# Patient Record
Sex: Male | Born: 1950 | Race: White | Hispanic: No | Marital: Married | Smoking: Never smoker
Health system: Southern US, Community
[De-identification: ages and names within clinical notes are randomized; demographics above are authoritative.]

## PROBLEM LIST (undated history)

## (undated) DIAGNOSIS — I10 Essential (primary) hypertension: Secondary | ICD-10-CM

## (undated) DIAGNOSIS — M1990 Unspecified osteoarthritis, unspecified site: Secondary | ICD-10-CM

## (undated) DIAGNOSIS — R112 Nausea with vomiting, unspecified: Secondary | ICD-10-CM

## (undated) DIAGNOSIS — E7800 Pure hypercholesterolemia, unspecified: Secondary | ICD-10-CM

## (undated) DIAGNOSIS — C4490 Unspecified malignant neoplasm of skin, unspecified: Secondary | ICD-10-CM

## (undated) DIAGNOSIS — T8859XA Other complications of anesthesia, initial encounter: Secondary | ICD-10-CM

## (undated) DIAGNOSIS — R079 Chest pain, unspecified: Secondary | ICD-10-CM

## (undated) DIAGNOSIS — E119 Type 2 diabetes mellitus without complications: Secondary | ICD-10-CM

## (undated) DIAGNOSIS — K219 Gastro-esophageal reflux disease without esophagitis: Secondary | ICD-10-CM

## (undated) DIAGNOSIS — Z9289 Personal history of other medical treatment: Secondary | ICD-10-CM

## (undated) DIAGNOSIS — Z9889 Other specified postprocedural states: Secondary | ICD-10-CM

## (undated) DIAGNOSIS — Z86718 Personal history of other venous thrombosis and embolism: Secondary | ICD-10-CM

## (undated) DIAGNOSIS — C801 Malignant (primary) neoplasm, unspecified: Secondary | ICD-10-CM

## (undated) DIAGNOSIS — R569 Unspecified convulsions: Secondary | ICD-10-CM

## (undated) DIAGNOSIS — F419 Anxiety disorder, unspecified: Secondary | ICD-10-CM

## (undated) DIAGNOSIS — I219 Acute myocardial infarction, unspecified: Secondary | ICD-10-CM

## (undated) DIAGNOSIS — I493 Ventricular premature depolarization: Secondary | ICD-10-CM

## (undated) DIAGNOSIS — G4730 Sleep apnea, unspecified: Secondary | ICD-10-CM

## (undated) DIAGNOSIS — D332 Benign neoplasm of brain, unspecified: Secondary | ICD-10-CM

## (undated) DIAGNOSIS — S065X9A Traumatic subdural hemorrhage with loss of consciousness of unspecified duration, initial encounter: Secondary | ICD-10-CM

## (undated) HISTORY — DX: Type 2 diabetes mellitus without complications: E11.9

## (undated) HISTORY — DX: Acute myocardial infarction, unspecified: I21.9

## (undated) HISTORY — DX: Anxiety disorder, unspecified: F41.9

## (undated) HISTORY — DX: Benign neoplasm of brain, unspecified: D33.2

## (undated) HISTORY — PX: OTHER SURGICAL HISTORY: SHX169

## (undated) HISTORY — DX: Gastro-esophageal reflux disease without esophagitis: K21.9

## (undated) HISTORY — PX: VASECTOMY: SHX75

## (undated) HISTORY — DX: Pure hypercholesterolemia, unspecified: E78.00

## (undated) HISTORY — DX: Personal history of other venous thrombosis and embolism: Z86.718

## (undated) HISTORY — DX: Unspecified malignant neoplasm of skin, unspecified: C44.90

## (undated) HISTORY — DX: Chest pain, unspecified: R07.9

## (undated) HISTORY — DX: Personal history of other medical treatment: Z92.89

## (undated) HISTORY — DX: Ventricular premature depolarization: I49.3

## (undated) HISTORY — PX: KNEE SURGERY: SHX244

## (undated) HISTORY — DX: Unspecified convulsions: R56.9

## (undated) HISTORY — PX: BRAIN SURGERY: SHX531

---

## 2006-01-18 DIAGNOSIS — S065X9A Traumatic subdural hemorrhage with loss of consciousness of unspecified duration, initial encounter: Secondary | ICD-10-CM

## 2006-01-18 DIAGNOSIS — S065XAA Traumatic subdural hemorrhage with loss of consciousness status unknown, initial encounter: Secondary | ICD-10-CM

## 2006-01-18 HISTORY — DX: Traumatic subdural hemorrhage with loss of consciousness of unspecified duration, initial encounter: S06.5X9A

## 2006-01-18 HISTORY — DX: Traumatic subdural hemorrhage with loss of consciousness status unknown, initial encounter: S06.5XAA

## 2006-04-20 ENCOUNTER — Inpatient Hospital Stay (HOSPITAL_COMMUNITY): Admission: AD | Admit: 2006-04-20 | Discharge: 2006-04-23 | Payer: Self-pay | Admitting: Neurosurgery

## 2006-05-12 ENCOUNTER — Encounter: Admission: RE | Admit: 2006-05-12 | Discharge: 2006-05-12 | Payer: Self-pay | Admitting: Neurosurgery

## 2010-01-16 ENCOUNTER — Emergency Department (HOSPITAL_COMMUNITY)
Admission: EM | Admit: 2010-01-16 | Discharge: 2010-01-16 | Payer: Self-pay | Source: Home / Self Care | Admitting: Emergency Medicine

## 2010-02-08 ENCOUNTER — Encounter: Payer: Self-pay | Admitting: Neurosurgery

## 2010-03-30 LAB — DIFFERENTIAL
Eosinophils Absolute: 0 10*3/uL (ref 0.0–0.7)
Eosinophils Relative: 0 % (ref 0–5)
Lymphs Abs: 0.8 10*3/uL (ref 0.7–4.0)
Monocytes Absolute: 0.6 10*3/uL (ref 0.1–1.0)

## 2010-03-30 LAB — CBC
MCH: 31.1 pg (ref 26.0–34.0)
MCV: 89.4 fL (ref 78.0–100.0)
Platelets: 198 10*3/uL (ref 150–400)
RBC: 5.08 MIL/uL (ref 4.22–5.81)

## 2010-03-30 LAB — BASIC METABOLIC PANEL
BUN: 17 mg/dL (ref 6–23)
CO2: 24 mEq/L (ref 19–32)
Chloride: 106 mEq/L (ref 96–112)
Creatinine, Ser: 1.52 mg/dL — ABNORMAL HIGH (ref 0.4–1.5)

## 2010-03-30 LAB — POTASSIUM: Potassium: 4.9 mEq/L (ref 3.5–5.1)

## 2010-06-05 NOTE — H&P (Signed)
NAME:  Anthony Downs, Anthony Downs NO.:  [id number]   MEDICAL RECORD NO.:  [id number]          PATIENT TYPE:  INP   LOCATION:  3112                         FACILITY:  [facility]   PHYSICIAN:  Hilda Lias, M.D.   DATE OF BIRTH:  06-Nov-1950   DATE OF ADMISSION:  04/20/2006  DATE OF DISCHARGE:                              HISTORY & PHYSICAL   HISTORY OF PRESENT ILLNESS:  Anthony Downs is a gentleman who was  brought here from [facility] emergency room with a history of  while he was at work, he fell and hit his head.  He has a decreased  level of conscious.  He complains of headache, and he was taken to  [facility], where a CT scan of the head was obtained, and record  the findings of subdural hematoma.  He was transferred to our service.  At the present time, the patient complains of headache.  He denies any  weakness.  There is no evidence of nausea or vomiting.   PAST MEDICAL HISTORY:  Knee surgery.   ALLERGIES:  He is not allergic to any medications.   MEDICATIONS:  He is not taking any medications at this time.   FAMILY HISTORY:  Unremarkable.   PHYSICAL EXAMINATION:  VITAL SIGNS:  Pulse 74, blood pressure 130/70.  HEAD, EARS, NOSE, THROAT:  There is a parietal occipital scalp hematoma.  There is no evidence of any blood behind the ears.  There is no blood  from the ear or from the nose.  Neck:  He had no tenderness.  Nose was  clear.  HEART:  Heart sounds normal.  ABDOMEN:  Normal.  EXTREMITIES:  Scar from previous surgery.  NEUROLOGICAL EXAMINATION:  Mental status:  The patient is oriented x3.  He complains of a mild headache.  He is slightly, mildly lethargic.  Cranial nerve:  He had 2 mm, equal, reactive.  He has full extraocular  movements.  Face symmetrical.  There is no evidence of any blood behind  the tympanic membrane.  Face symmetrical.  Strength normal.  Sensation  normal.  Coordination normal.  Reflexes normal.   LABORATORY DATA:  The  cervical spine trace negative for fractures.  The  CT scan of the brain shows small subdural hematoma on the left side,  mostly from the temporal, with minimal shift.   IMPRESSION:  Closed head injury with a small subdural hematoma.   RECOMMENDATIONS:  The patient is going to be admitted to the Intensive  Care Unit.  We are going to closely  monitor him.  At the present time,  there is no need for any surgery.  We are going to repeat the CT scan in  the morning or p.r.n. as needed.          ______________________________  Hilda Lias, M.D.    EB/MEDQ  D:  04/20/2006  T:  04/21/2006  Job:  [id number]

## 2010-08-25 ENCOUNTER — Emergency Department (HOSPITAL_COMMUNITY)
Admission: EM | Admit: 2010-08-25 | Discharge: 2010-08-25 | Disposition: A | Discharge status: Home / Self Care | Payer: BC Managed Care – PPO | Attending: Emergency Medicine | Admitting: Emergency Medicine

## 2010-08-25 ENCOUNTER — Encounter: Payer: Self-pay | Admitting: Emergency Medicine

## 2010-08-25 DIAGNOSIS — W268XXA Contact with other sharp object(s), not elsewhere classified, initial encounter: Secondary | ICD-10-CM | POA: Insufficient documentation

## 2010-08-25 DIAGNOSIS — J45909 Unspecified asthma, uncomplicated: Secondary | ICD-10-CM | POA: Insufficient documentation

## 2010-08-25 DIAGNOSIS — S61419A Laceration without foreign body of unspecified hand, initial encounter: Secondary | ICD-10-CM

## 2010-08-25 DIAGNOSIS — S61409A Unspecified open wound of unspecified hand, initial encounter: Secondary | ICD-10-CM | POA: Insufficient documentation

## 2010-08-25 HISTORY — DX: Traumatic subdural hemorrhage with loss of consciousness of unspecified duration, initial encounter: S06.5X9A

## 2010-08-25 MED ORDER — HYDROCODONE-ACETAMINOPHEN 5-325 MG PO TABS: ORAL_TABLET | ORAL | Status: DC

## 2010-08-25 NOTE — ED Notes (Signed)
Pt reports he "hung my hand on a piece of galvanized tin." (Power bin on top of house)

## 2010-08-25 NOTE — ED Provider Notes (Signed)
History     CSN: [id number] Arrival date & time: 08/25/2010  4:12 PM  Chief Complaint  Patient presents with  . Laceration    R hand   Patient is a 60 y.o. male presenting with skin laceration. The history is provided by the patient.  Laceration  The incident occurred 3 to 5 hours ago. The laceration is located on the right hand. The laceration is 3 cm in size. Injury mechanism: sharpe tin. The pain is moderate. The pain has been constant since onset. He reports no foreign bodies present. His tetanus status is UTD.    Past Medical History  Diagnosis Date  . Subdural hematoma, post-traumatic   . Asthma     Past Surgical History  Procedure Date  . Arthroscopic knee   . Vasectomy     History reviewed. No pertinent family history.  History  Substance Use Topics  . Smoking status: Never Smoker   . Smokeless tobacco: Never Used  . Alcohol Use: No      Review of Systems  Constitutional: Negative for activity change.       All ROS Neg except as noted in HPI  HENT: Negative for nosebleeds and neck pain.   Eyes: Negative for photophobia and discharge.  Respiratory: Negative for cough, shortness of breath and wheezing.   Cardiovascular: Negative for chest pain and palpitations.  Gastrointestinal: Negative for abdominal pain and blood in stool.  Genitourinary: Negative for dysuria, frequency and hematuria.  Musculoskeletal: Negative for back pain and arthralgias.  Skin: Negative.   Neurological: Negative for dizziness, seizures and speech difficulty.  Psychiatric/Behavioral: Negative for hallucinations and confusion.    Physical Exam  BP 154/88  Pulse 76  Temp(Src) 98.4 F (36.9 C) (Oral)  Resp 18  Ht 6\' 1"  (1.854 m)  Wt 281 lb (127.461 kg)  BMI 37.07 kg/m2  SpO2 98%  Physical Exam  Nursing note and vitals reviewed. Constitutional: He is oriented to person, place, and time. He appears well-developed and well-nourished.  Non-toxic appearance.  HENT:  Head:  Normocephalic.  Right Ear: Tympanic membrane and external ear normal.  Left Ear: Tympanic membrane and external ear normal.  Eyes: EOM and lids are normal. Pupils are equal, round, and reactive to light.  Neck: Normal range of motion. Neck supple. Carotid bruit is not present.  Cardiovascular: Normal rate, regular rhythm, normal heart sounds, intact distal pulses and normal pulses.   Pulmonary/Chest: Breath sounds normal. No respiratory distress.  Abdominal: Soft. Bowel sounds are normal. There is no tenderness. There is no guarding.  Musculoskeletal: Normal range of motion.       Laceration to the palmar surface of the right hand. No bone or tendon involvement.  Lymphadenopathy:       Head (right side): No submandibular adenopathy present.       Head (left side): No submandibular adenopathy present.    He has no cervical adenopathy.  Neurological: He is alert and oriented to person, place, and time. He has normal strength. No cranial nerve deficit or sensory deficit.  Skin: Skin is warm and dry.  Psychiatric: He has a normal mood and affect. His speech is normal.    ED Course  LACERATION REPAIR Date/Time: 08/25/2010 5:15 PM Performed by: Kathie Dike Authorized by: Glynn Octave Consent: Verbal consent obtained. Consent given by: patient Patient understanding: patient states understanding of the procedure being performed Patient identity confirmed: arm band Time out: Immediately prior to procedure a "time out" was called to verify the  correct patient, procedure, equipment, support staff and site/side marked as required. Body area: upper extremity Location details: right hand Laceration length: 3.2 cm Foreign bodies: no foreign bodies Tendon involvement: none Nerve involvement: none Vascular damage: no Patient sedated: no Irrigation solution: tap water Irrigation method: tap Amount of cleaning: standard Debridement: none Degree of undermining: none Skin closure:  glue Approximation difficulty: simple Dressing: 4x4 sterile gauze and gauze roll Patient tolerance: Patient tolerated the procedure well with no immediate complications.    MDM I have reviewed nursing notes, vital signs, and all appropriate lab and imaging results for this patient.      Kathie Dike, [place] 08/25/10 1718

## 2010-08-25 NOTE — ED Notes (Signed)
approx 1.5 cm laceration to base of right thumb-states cut on galvenized pipe while cutting shingles around it; bleeding is controlled with bandaid in place, with edges well-approximated

## 2010-08-25 NOTE — ED Notes (Signed)
Wound to right hand dressed with telfa and kling

## 2010-08-26 NOTE — ED Provider Notes (Signed)
Medical screening examination/treatment/procedure(s) were performed by non-physician practitioner and as supervising physician I was immediately available for consultation/collaboration.  Glynn Octave, MD 08/26/10 [phone]

## 2011-04-04 ENCOUNTER — Emergency Department (HOSPITAL_COMMUNITY)
Admission: EM | Admit: 2011-04-04 | Discharge: 2011-04-05 | Disposition: A | Discharge status: Home / Self Care | Payer: BC Managed Care – PPO | Attending: Emergency Medicine | Admitting: Emergency Medicine

## 2011-04-04 ENCOUNTER — Encounter (HOSPITAL_COMMUNITY): Payer: Self-pay | Admitting: *Deleted

## 2011-04-04 DIAGNOSIS — A084 Viral intestinal infection, unspecified: Secondary | ICD-10-CM

## 2011-04-04 DIAGNOSIS — A088 Other specified intestinal infections: Secondary | ICD-10-CM | POA: Insufficient documentation

## 2011-04-04 DIAGNOSIS — R112 Nausea with vomiting, unspecified: Secondary | ICD-10-CM | POA: Insufficient documentation

## 2011-04-04 DIAGNOSIS — R197 Diarrhea, unspecified: Secondary | ICD-10-CM | POA: Insufficient documentation

## 2011-04-04 DIAGNOSIS — R6883 Chills (without fever): Secondary | ICD-10-CM | POA: Insufficient documentation

## 2011-04-04 DIAGNOSIS — R1084 Generalized abdominal pain: Secondary | ICD-10-CM | POA: Insufficient documentation

## 2011-04-04 DIAGNOSIS — J45909 Unspecified asthma, uncomplicated: Secondary | ICD-10-CM | POA: Insufficient documentation

## 2011-04-04 DIAGNOSIS — R5381 Other malaise: Secondary | ICD-10-CM | POA: Insufficient documentation

## 2011-04-04 LAB — CBC
HCT: 44.1 % (ref 39.0–52.0)
Hemoglobin: 14.9 g/dL (ref 13.0–17.0)
MCH: 31 pg (ref 26.0–34.0)
MCHC: 33.8 g/dL (ref 30.0–36.0)
MCV: 91.9 fL (ref 78.0–100.0)
RBC: 4.8 MIL/uL (ref 4.22–5.81)

## 2011-04-04 LAB — URINALYSIS, ROUTINE W REFLEX MICROSCOPIC
Bilirubin Urine: NEGATIVE
Glucose, UA: NEGATIVE mg/dL
Hgb urine dipstick: NEGATIVE
Ketones, ur: 15 mg/dL — AB
Nitrite: NEGATIVE
pH: 6 (ref 5.0–8.0)

## 2011-04-04 LAB — BASIC METABOLIC PANEL
BUN: 20 mg/dL (ref 6–23)
CO2: 25 mEq/L (ref 19–32)
Chloride: 105 mEq/L (ref 96–112)
Glucose, Bld: 160 mg/dL — ABNORMAL HIGH (ref 70–99)
Potassium: 5.2 mEq/L — ABNORMAL HIGH (ref 3.5–5.1)

## 2011-04-04 LAB — DIFFERENTIAL
Basophils Relative: 0 % (ref 0–1)
Eosinophils Absolute: 0 10*3/uL (ref 0.0–0.7)
Lymphs Abs: 0.6 10*3/uL — ABNORMAL LOW (ref 0.7–4.0)
Monocytes Absolute: 0.4 10*3/uL (ref 0.1–1.0)
Monocytes Relative: 5 % (ref 3–12)

## 2011-04-04 MED ORDER — MORPHINE SULFATE 4 MG/ML IJ SOLN
4.0000 mg | Freq: Once | INTRAMUSCULAR | Status: AC
  Administered 2011-04-04: 4 mg via INTRAVENOUS
  Filled 2011-04-04: qty 1

## 2011-04-04 MED ORDER — DIPHENOXYLATE-ATROPINE 2.5-0.025 MG PO TABS
1.0000 | ORAL_TABLET | Freq: Once | ORAL | Status: AC
  Administered 2011-04-04: 1 via ORAL
  Filled 2011-04-04: qty 1

## 2011-04-04 MED ORDER — ONDANSETRON HCL 4 MG PO TABS: 4.0000 mg | ORAL_TABLET | Freq: Four times a day (QID) | ORAL | Status: AC

## 2011-04-04 MED ORDER — SODIUM CHLORIDE 0.9 % IV BOLUS (SEPSIS)
1000.0000 mL | Freq: Once | INTRAVENOUS | Status: AC
  Administered 2011-04-04: 1000 mL via INTRAVENOUS

## 2011-04-04 MED ORDER — ONDANSETRON HCL 4 MG/2ML IJ SOLN
8.0000 mg | Freq: Once | INTRAMUSCULAR | Status: AC
  Administered 2011-04-04: 8 mg via INTRAVENOUS
  Filled 2011-04-04: qty 4

## 2011-04-04 MED ORDER — DIPHENOXYLATE-ATROPINE 2.5-0.025 MG PO TABS: 1.0000 | ORAL_TABLET | Freq: Four times a day (QID) | ORAL | Status: AC | PRN

## 2011-04-04 MED ORDER — PANTOPRAZOLE SODIUM 40 MG IV SOLR
40.0000 mg | Freq: Once | INTRAVENOUS | Status: AC
  Administered 2011-04-04: 40 mg via INTRAVENOUS
  Filled 2011-04-04: qty 40

## 2011-04-04 NOTE — ED Provider Notes (Signed)
History     CSN: [id number]  Arrival date & time 04/04/11  1939   First MD Initiated Contact with Patient 04/04/11 2028      Chief Complaint  Patient presents with  . Nausea  . Emesis  . Diarrhea  . Abdominal Pain  . Chills    (Consider location/radiation/quality/duration/timing/severity/associated sxs/prior treatment) HPI Comments: Associated abdominal pain which began after he had numerous episodes of emesis. The emesis and diarrhea is nonbloody. No recent abx use  Patient is a 61 y.o. male presenting with vomiting and diarrhea. The history is provided by the patient. No language interpreter was used.  Emesis  This is a new problem. The current episode started 12 to 24 hours ago. The problem occurs 5 to 10 times per day. The problem has been gradually worsening. The emesis has an appearance of stomach contents. There has been no fever. Associated symptoms include abdominal pain and diarrhea. Pertinent negatives include no arthralgias, no chills, no cough, no fever, no headaches and no myalgias.  Diarrhea The primary symptoms include fatigue, abdominal pain, nausea, vomiting and diarrhea. Primary symptoms do not include fever, dysuria, myalgias or arthralgias. The illness began today. The onset was gradual. The problem has been gradually worsening.  The fatigue began today. The fatigue has been worsening since its onset. The fatigue is worsened by nothing.  The abdominal pain began today. The abdominal pain has been gradually worsening since its onset. The abdominal pain is generalized. The abdominal pain does not radiate. The abdominal pain is relieved by nothing.  The diarrhea began today. The diarrhea is watery. The diarrhea occurs 5 to 10 times per day.  The illness does not include chills, constipation or back pain.    Past Medical History  Diagnosis Date  . Subdural hematoma, post-traumatic   . Asthma     Past Surgical History  Procedure Date  . Arthroscopic knee   .  Vasectomy     History reviewed. No pertinent family history.  History  Substance Use Topics  . Smoking status: Never Smoker   . Smokeless tobacco: Never Used  . Alcohol Use: No      Review of Systems  Constitutional: Positive for activity change, appetite change and fatigue. Negative for fever and chills.  HENT: Negative for congestion, sore throat, rhinorrhea, neck pain and neck stiffness.   Respiratory: Negative for cough and shortness of breath.   Cardiovascular: Negative for chest pain and palpitations.  Gastrointestinal: Positive for nausea, vomiting, abdominal pain and diarrhea. Negative for constipation and blood in stool.  Genitourinary: Negative for dysuria, urgency, frequency and flank pain.  Musculoskeletal: Negative for myalgias, back pain and arthralgias.  Neurological: Positive for weakness (generalized). Negative for dizziness, light-headedness, numbness and headaches.  All other systems reviewed and are negative.    Allergies  Review of patient's allergies indicates no known allergies.  Home Medications   Current Outpatient Rx  Name Route Sig Dispense Refill  . IBUPROFEN 200 MG PO TABS Oral Take 800 mg by mouth as needed. For back pain     . OMEPRAZOLE 20 MG PO CPDR Oral Take 40 mg by mouth daily.      Marland Kitchen DIPHENOXYLATE-ATROPINE 2.5-0.025 MG PO TABS Oral Take 1 tablet by mouth 4 (four) times daily as needed for diarrhea or loose stools. 16 tablet 0  . ONDANSETRON HCL 4 MG PO TABS Oral Take 1 tablet (4 mg total) by mouth every 6 (six) hours. 12 tablet 0    BP 119/66  Pulse 116  Temp 98.6 F (37 C)  Resp 20  Ht 6\' 1"  (1.854 m)  Wt 280 lb (127.007 kg)  BMI 36.94 kg/m2  SpO2 98%  Physical Exam  Nursing note and vitals reviewed. Constitutional: He is oriented to person, place, and time. He appears well-developed and well-nourished. No distress.  HENT:  Head: Normocephalic and atraumatic.  Mouth/Throat: Oropharynx is clear and moist.  Eyes: Conjunctivae  and EOM are normal. Pupils are equal, round, and reactive to light.  Neck: Normal range of motion. Neck supple.  Cardiovascular: Regular rhythm, normal heart sounds and intact distal pulses.  Exam reveals no gallop and no friction rub.   No murmur heard.      Tachycardic rate  Pulmonary/Chest: Effort normal and breath sounds normal. No respiratory distress.  Abdominal: Soft. Bowel sounds are normal. There is tenderness (diffuse). There is no rebound and no guarding.  Musculoskeletal: Normal range of motion. He exhibits no tenderness.  Neurological: He is alert and oriented to person, place, and time. No cranial nerve deficit.  Skin: Skin is warm and dry. No rash noted.    ED Course  Procedures (including critical care time)   Labs Reviewed  CBC  DIFFERENTIAL  BASIC METABOLIC PANEL  URINALYSIS, ROUTINE W REFLEX MICROSCOPIC  LACTIC ACID, PLASMA  LACTIC ACID, PLASMA  BASIC METABOLIC PANEL  CBC  DIFFERENTIAL   No results found.   1. Viral gastroenteritis       MDM  Mild tachycardia on arrival which I anticipate will resolve with 2 L of IV fluids. Symptoms have improved after receiving Zofran, pain medication. I also administered Lomotil for symptom control. He'll be given a by mouth challenge in the emergency department. If he succeeds he'll be discharged home. Care to be assumed by my colleague Dr. Radford Pax.his symptoms are easily explained that are gastroenteritis. I have no concern about a malignant cause of abdominal pain such as appendicitis, ischemic bowel. There is no indication for imaging at this time.        Dayton Bailiff, MD 04/04/11 2140

## 2011-04-04 NOTE — Discharge Instructions (Signed)
Viral Gastroenteritis Viral gastroenteritis is also known as stomach flu. This condition affects the stomach and intestinal tract. It can cause sudden diarrhea and vomiting. The illness typically lasts 3 to 8 days. Most people develop an immune response that eventually gets rid of the virus. While this natural response develops, the virus can make you quite ill. CAUSES  Many different viruses can cause gastroenteritis, such as rotavirus or noroviruses. You can catch one of these viruses by consuming contaminated food or water. You may also catch a virus by sharing utensils or other personal items with an infected person or by touching a contaminated surface. SYMPTOMS  The most common symptoms are diarrhea and vomiting. These problems can cause a severe loss of body fluids (dehydration) and a body salt (electrolyte) imbalance. Other symptoms may include:  Fever.   Headache.   Fatigue.   Abdominal pain.  DIAGNOSIS  Your caregiver can usually diagnose viral gastroenteritis based on your symptoms and a physical exam. A stool sample may also be taken to test for the presence of viruses or other infections. TREATMENT  This illness typically goes away on its own. Treatments are aimed at rehydration. The most serious cases of viral gastroenteritis involve vomiting so severely that you are not able to keep fluids down. In these cases, fluids must be given through an intravenous line (IV). HOME CARE INSTRUCTIONS   Drink enough fluids to keep your urine clear or pale yellow. Drink small amounts of fluids frequently and increase the amounts as tolerated.   Ask your caregiver for specific rehydration instructions.   Avoid:   Foods high in sugar.   Alcohol.   Carbonated drinks.   Tobacco.   Juice.   Caffeine drinks.   Extremely hot or cold fluids.   Fatty, greasy foods.   Too much intake of anything at one time.   Dairy products until 24 to 48 hours after diarrhea stops.   You may  consume probiotics. Probiotics are active cultures of beneficial bacteria. They may lessen the amount and number of diarrheal stools in adults. Probiotics can be found in yogurt with active cultures and in supplements.   Wash your hands well to avoid spreading the virus.   Only take over-the-counter or prescription medicines for pain, discomfort, or fever as directed by your caregiver. Do not give aspirin to children. Antidiarrheal medicines are not recommended.   Ask your caregiver if you should continue to take your regular prescribed and over-the-counter medicines.   Keep all follow-up appointments as directed by your caregiver.  SEEK IMMEDIATE MEDICAL CARE IF:   You are unable to keep fluids down.   You do not urinate at least once every 6 to 8 hours.   You develop shortness of breath.   You notice blood in your stool or vomit. This may look like coffee grounds.   You have abdominal pain that increases or is concentrated in one small area (localized).   You have persistent vomiting or diarrhea.   You have a fever.   The patient is a child younger than 3 months, and he or she has a fever.   The patient is a child older than 3 months, and he or she has a fever and persistent symptoms.   The patient is a child older than 3 months, and he or she has a fever and symptoms suddenly get worse.   The patient is a baby, and he or she has no tears when crying.  MAKE SURE YOU:     Understand these instructions.   Will watch your condition.   Will get help right away if you are not doing well or get worse.  Document Released: 01/04/2005 Document Revised: 12/24/2010 Document Reviewed: 10/21/2010 [employer] Patient Information 2012 [employer]. 

## 2011-04-04 NOTE — ED Notes (Addendum)
Pt c/o n/v/d since 3am last night. Unable to keep anti-nausea meds down. Pt also having chills and sweats.

## 2011-04-05 NOTE — ED Notes (Signed)
Feeling better - states he is ready for discharge

## 2014-10-15 ENCOUNTER — Ambulatory Visit (INDEPENDENT_AMBULATORY_CARE_PROVIDER_SITE_OTHER): Payer: Medicare Other | Admitting: Orthopedic Surgery

## 2014-10-15 ENCOUNTER — Ambulatory Visit (INDEPENDENT_AMBULATORY_CARE_PROVIDER_SITE_OTHER): Payer: Medicare Other

## 2014-10-15 ENCOUNTER — Encounter: Payer: Self-pay | Admitting: Orthopedic Surgery

## 2014-10-15 VITALS — BP 145/84 | Ht 73.0 in | Wt 296.0 lb

## 2014-10-15 DIAGNOSIS — M25561 Pain in right knee: Secondary | ICD-10-CM

## 2014-10-15 DIAGNOSIS — M1711 Unilateral primary osteoarthritis, right knee: Secondary | ICD-10-CM

## 2014-10-15 MED ORDER — DICLOFENAC POTASSIUM 50 MG PO TABS: 50.0000 mg | ORAL_TABLET | Freq: Two times a day (BID) | ORAL | Status: DC

## 2014-10-15 NOTE — Progress Notes (Signed)
Patient ID: Anthony Downs, male   DOB: 1950-05-09, 64 y.o.   MRN: [id number]  New patient   Chief Complaint  Patient presents with  . Knee Pain    Right knee pain     Anthony Downs is a 64 y.o. male.   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 It's a dull aching pain is no trauma. He had a knee arthroscopy in 2004 his pain is 6 out of 10 is worse with standing and better with rest no other treatment. Completing comprehensive review of systems we note seasonal allergy back pain joint pain ankle leg swelling in otherwise normal   Review of Systems See hpi  Past Medical History  Diagnosis Date  . Subdural hematoma, post-traumatic   . Asthma     Past Surgical History  Procedure Laterality Date  . Arthroscopic knee    . Vasectomy      No family history on file.  Social History Social History  Substance Use Topics  . Smoking status: Never Smoker   . Smokeless tobacco: Never Used  . Alcohol Use: No    No Known Allergies  Current Outpatient Prescriptions  Medication Sig Dispense Refill  . diclofenac (CATAFLAM) 50 MG tablet Take 1 tablet (50 mg total) by mouth 2 (two) times daily. 60 tablet 3  . ibuprofen (ADVIL,MOTRIN) 200 MG tablet Take 800 mg by mouth as needed. For back pain     . omeprazole (PRILOSEC) 20 MG capsule Take 40 mg by mouth daily.       No current facility-administered medications for this visit.       Physical Exam Blood pressure 145/84, height 6\' 1"  (1.854 m), weight 296 lb (134.265 kg). Physical Exam The patient is well developed well nourished and well groomed. Orientation to person place and time is normal  Mood is pleasant. Ambulatory status is normal without a limp Skin remains intact without laceration ulceration or erythema Gross motor exam is intact  without atrophy. Muscle tone normal grade 5 motor strength Neurovascular exam remains intact Inspection of the left knee reveals full range of motion no strength deficits no instability  Right knee range of motion is full medial joint line is nontender patellofemoral crepitance positive quadriceps in addition test joint stability confirmed with Lachman test posterior drawer test and collateral ligament test.  Meniscal signs are negative   Data Reviewed  I interpreted the images as  Varus mild with moderate joint space narrowing equaling moderate osteoarthritis right knee  Assessment   Primary osteoarthritis right knee with patellofemoral primary symptom at this point  Procedure note right knee injection verbal consent was obtained to inject right knee joint  Timeout was completed to confirm the site of injection  The medications used were 40 mg of Depo-Medrol and 1% lidocaine 3 cc  Anesthesia was provided by ethyl chloride and the skin was prepped with alcohol.  After cleaning the skin with alcohol a 20-gauge needle was used to inject the right knee joint. There were no complications. A sterile bandage was applied.   Plan   Meds ordered this encounter  Medications  . diclofenac (CATAFLAM) 50 MG tablet    Sig: Take 1 tablet (50 mg total) by mouth 2 (two) times daily.    Dispense:  60 tablet    Refill:  3  2 months f/u

## 2014-12-17 ENCOUNTER — Ambulatory Visit (INDEPENDENT_AMBULATORY_CARE_PROVIDER_SITE_OTHER): Payer: Medicare Other | Admitting: Orthopedic Surgery

## 2014-12-17 VITALS — BP 138/84 | Ht 73.0 in | Wt 303.0 lb

## 2014-12-17 DIAGNOSIS — M1711 Unilateral primary osteoarthritis, right knee: Secondary | ICD-10-CM

## 2014-12-17 NOTE — Progress Notes (Signed)
Subjective:     Patient ID: Anthony Downs, male   DOB: Mar 06, 1950, 64 y.o.   MRN: [id number]  HPI Chief Complaint  Patient presents with  . 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 It's a dull aching pain is no trauma. He had a knee arthroscopy in 2004 his pain is 6 out of 10 is worse with standing and better with rest no other treatment. Completing comprehensive review of systems we note seasonal allergy back pain joint pain ankle leg swelling in otherwise normal   Review of Systems     Objective:   Physical Exam Normal range of motion no effusion    Assessment:     Improved status post injection diclofenac    Plan:     Follow-up as needed

## 2015-02-09 ENCOUNTER — Other Ambulatory Visit: Payer: Self-pay | Admitting: Orthopedic Surgery

## 2015-02-21 ENCOUNTER — Other Ambulatory Visit: Payer: Self-pay | Admitting: *Deleted

## 2015-02-21 MED ORDER — DICLOFENAC POTASSIUM 50 MG PO TABS: 50.0000 mg | ORAL_TABLET | Freq: Two times a day (BID) | ORAL | Status: DC

## 2016-08-28 ENCOUNTER — Emergency Department (HOSPITAL_COMMUNITY): Payer: Medicare Other

## 2016-08-28 ENCOUNTER — Encounter (HOSPITAL_COMMUNITY): Payer: Self-pay | Admitting: Emergency Medicine

## 2016-08-28 ENCOUNTER — Emergency Department (HOSPITAL_COMMUNITY)
Admission: EM | Admit: 2016-08-28 | Discharge: 2016-08-28 | Disposition: A | Discharge status: Home / Self Care | Payer: Medicare Other | Attending: Emergency Medicine | Admitting: Emergency Medicine

## 2016-08-28 DIAGNOSIS — S40022A Contusion of left upper arm, initial encounter: Secondary | ICD-10-CM | POA: Diagnosis not present

## 2016-08-28 DIAGNOSIS — Y999 Unspecified external cause status: Secondary | ICD-10-CM | POA: Diagnosis not present

## 2016-08-28 DIAGNOSIS — S0990XA Unspecified injury of head, initial encounter: Secondary | ICD-10-CM | POA: Insufficient documentation

## 2016-08-28 DIAGNOSIS — W108XXA Fall (on) (from) other stairs and steps, initial encounter: Secondary | ICD-10-CM | POA: Insufficient documentation

## 2016-08-28 DIAGNOSIS — Y929 Unspecified place or not applicable: Secondary | ICD-10-CM | POA: Insufficient documentation

## 2016-08-28 DIAGNOSIS — J45909 Unspecified asthma, uncomplicated: Secondary | ICD-10-CM | POA: Diagnosis not present

## 2016-08-28 DIAGNOSIS — Y939 Activity, unspecified: Secondary | ICD-10-CM | POA: Insufficient documentation

## 2016-08-28 MED ORDER — BACITRACIN ZINC 500 UNIT/GM EX OINT
TOPICAL_OINTMENT | CUTANEOUS | Status: AC
  Filled 2016-08-28: qty 0.9

## 2016-08-28 NOTE — ED Notes (Signed)
Pt with mild nausea but denies emesis,  Pt with hx of subdural hematoma about 10 yrs ago.

## 2016-08-28 NOTE — Discharge Instructions (Signed)
If you were given medicines take as directed.  If you are on coumadin or contraceptives realize their levels and effectiveness is altered by many different medicines.  If you have any reaction (rash, tongues swelling, other) to the medicines stop taking and see a physician.    If your blood pressure was elevated in the ER make sure you follow up for management with a primary doctor or return for chest pain, shortness of breath or stroke symptoms.  Please follow up as directed and return to the ER or see a physician for new or worsening symptoms.  Thank you. Vitals:   08/28/16 1101 08/28/16 1104  BP:  (!) 152/80  Pulse:  75  Resp:  16  Temp:  98.2 F (36.8 C)  TempSrc:  Oral  SpO2:  95%  Weight: 134.3 kg (296 lb)   Height: 6' (1.829 m)

## 2016-08-28 NOTE — ED Provider Notes (Signed)
[place] DEPT Provider Note   CSN: [id number] Arrival date & time: 08/28/16  1056     History   Chief Complaint Chief Complaint  Patient presents with  . Fall    HPI Anthony Downs is a 66 y.o. male.  Patient presents after head injury. Patient fell down 4 steps backing the left side of his scalp on the cement. No loss of consciousness mild nausea without vomiting. No blood thinners. Patient did have to medics subdural hematoma from a fall the past. Patient had left arm injury with superficial abrasions.      Past Medical History:  Diagnosis Date  . Asthma   . Subdural hematoma, post-traumatic (HCC)     There are no active problems to display for this patient.   Past Surgical History:  Procedure Laterality Date  . arthroscopic knee    . VASECTOMY         Home Medications    Prior to Admission medications   Medication Sig Start Date End Date Taking? Authorizing Provider  atorvastatin (LIPITOR) 10 MG tablet Take 1 tablet by mouth at bedtime. 08/27/16  Yes [provider]  ibuprofen (ADVIL,MOTRIN) 200 MG tablet Take 800 mg by mouth as needed for moderate pain.    Yes [provider]  omeprazole (PRILOSEC) 20 MG capsule Take 40 mg by mouth daily.     Yes [provider]  Propylene Glycol-Glycerin (SOOTHE OP) Apply 1 drop to eye daily as needed (dry eyes).   Yes [provider]    Family History No family history on file.  Social History Social History  Substance Use Topics  . Smoking status: Never Smoker  . Smokeless tobacco: Never Used  . Alcohol use No     Allergies   Patient has no known allergies.   Review of Systems Review of Systems  Constitutional: Negative for chills and fever.  HENT: Negative for congestion.   Eyes: Negative for visual disturbance.  Respiratory: Negative for shortness of breath.   Cardiovascular: Negative for chest pain.  Gastrointestinal: Positive for nausea. Negative for  abdominal pain and vomiting.  Genitourinary: Negative for dysuria and flank pain.  Musculoskeletal: Positive for arthralgias. Negative for back pain, neck pain and neck stiffness.  Skin: Positive for wound. Negative for rash.  Neurological: Positive for headaches. Negative for light-headedness.     Physical Exam Updated Vital Signs BP (!) 152/80   Pulse 75   Temp 98.2 F (36.8 C) (Oral)   Resp 16   Ht 6' (1.829 m)   Wt 134.3 kg (296 lb)   SpO2 95%   BMI 40.14 kg/m   Physical Exam  Constitutional: He is oriented to person, place, and time. He appears well-developed and well-nourished.  HENT:  Head: Normocephalic.  Patient has large scalp hematoma prior to region on the left mild tender.  Eyes: Right eye exhibits no discharge. Left eye exhibits no discharge.  Neck: Normal range of motion. Neck supple. No tracheal deviation present.  Cardiovascular: Normal rate.   Pulmonary/Chest: Effort normal.  Abdominal: Soft. He exhibits no distension. There is no tenderness.  Musculoskeletal: He exhibits tenderness. He exhibits no edema.  Neurological: He is alert and oriented to person, place, and time. He has normal strength. No cranial nerve deficit.  Skin: Skin is warm. Rash noted.  Patient has superficial abrasion to left posterior arm without bony tenderness or effusion to the left elbow.  Psychiatric: He has a normal mood and affect.  Nursing note and vitals  reviewed.    ED Treatments / Results  Labs (all labs ordered are listed, but only abnormal results are displayed) Labs Reviewed - No data to display  EKG  EKG Interpretation None       Radiology Ct Head Wo Contrast  Result Date: 08/28/2016 CLINICAL DATA:  Fall EXAM: CT HEAD WITHOUT CONTRAST TECHNIQUE: Contiguous axial images were obtained from the base of the skull through the vertex without intravenous contrast. COMPARISON:  05/12/2006 FINDINGS: Brain: There is encephalomalacia in the inferior left frontal and  lateral left anterior temporal lobes. No mass effect, midline shift, or acute hemorrhage. Vascular: No hyperdense vessel or unexpected calcification. Skull: Intact. Sinuses/Orbits: Mastoid air cells are clear. There is some layering mucous material in the left frontal sinus and adjacent mucosal thickening in the anterior left ethmoid air cells. They are similar changes in the posterior left ethmoid air cells. Other: Noncontributory IMPRESSION: No acute intracranial pathology. Electronically Signed   By: Marybelle Killings M.D.   On: 08/28/2016 13:06    Procedures Procedures (including critical care time)  Medications Ordered in ED Medications - No data to display   Initial Impression / Assessment and Plan / ED Course  I have reviewed the triage vital signs and the nursing notes.  Pertinent labs & imaging results that were available during my care of the patient were reviewed by me and considered in my medical decision making (see chart for details).    Patient presents with significant left head injury.neurologically patient doing well we discussed observation versus CT scan and with patient subdural history after trauma elected to perform at this time. CT scan unremarkable. Supportive care discussed.  Results and differential diagnosis were discussed with the patient/parent/guardian. Xrays were independently reviewed by myself.  Close follow up outpatient was discussed, comfortable with the plan.   Medications - No data to display  Vitals:   08/28/16 1101 08/28/16 1104  BP:  (!) 152/80  Pulse:  75  Resp:  16  Temp:  98.2 F (36.8 C)  TempSrc:  Oral  SpO2:  95%  Weight: 134.3 kg (296 lb)   Height: 6' (1.829 m)     Final diagnoses:  Acute head injury, initial encounter  Arm contusion, left, initial encounter     Final Clinical Impressions(s) / ED Diagnoses   Final diagnoses:  Acute head injury, initial encounter  Arm contusion, left, initial encounter    New  Prescriptions New Prescriptions   No medications on file     [name], MD 08/28/16 1321

## 2016-08-28 NOTE — ED Triage Notes (Signed)
Pt fell down stair and hour ago, hitting head and elbow, denies LOC, abrasion noted

## 2016-08-28 NOTE — ED Notes (Signed)
Pt able to state month, place and age correctly.  Pt denies taking any blood thinners or ASA

## 2017-07-27 ENCOUNTER — Encounter (INDEPENDENT_AMBULATORY_CARE_PROVIDER_SITE_OTHER): Payer: Self-pay | Admitting: *Deleted

## 2018-01-09 ENCOUNTER — Encounter (INDEPENDENT_AMBULATORY_CARE_PROVIDER_SITE_OTHER): Payer: Self-pay | Admitting: *Deleted

## 2018-01-09 ENCOUNTER — Other Ambulatory Visit (INDEPENDENT_AMBULATORY_CARE_PROVIDER_SITE_OTHER): Payer: Self-pay | Admitting: *Deleted

## 2018-01-09 DIAGNOSIS — Z1211 Encounter for screening for malignant neoplasm of colon: Secondary | ICD-10-CM | POA: Insufficient documentation

## 2018-03-21 ENCOUNTER — Encounter (INDEPENDENT_AMBULATORY_CARE_PROVIDER_SITE_OTHER): Payer: Self-pay | Admitting: *Deleted

## 2018-03-21 ENCOUNTER — Telehealth (INDEPENDENT_AMBULATORY_CARE_PROVIDER_SITE_OTHER): Payer: Self-pay | Admitting: *Deleted

## 2018-03-21 NOTE — Telephone Encounter (Signed)
Patient needs trilyte 

## 2018-03-22 MED ORDER — PEG 3350-KCL-NA BICARB-NACL 420 G PO SOLR: 4000.0000 mL | Freq: Once | ORAL | 0 refills | Status: AC

## 2018-04-13 ENCOUNTER — Telehealth (INDEPENDENT_AMBULATORY_CARE_PROVIDER_SITE_OTHER): Payer: Self-pay | Admitting: *Deleted

## 2018-04-13 NOTE — Telephone Encounter (Signed)
Referring MD/PCP: barrino   Procedure: tcs  Reason/Indication:  screening  Has patient had this procedure before?  Yes, overe 10 yrs ago  If so, when, by whom and where?    Is there a family history of colon cancer?  no  Who?  What age when diagnosed?    Is patient diabetic?   no      Does patient have prosthetic heart valve or mechanical valve?  no  Do you have a pacemaker?  no  Has patient ever had endocarditis? no  Has patient had joint replacement within last 12 months?  no  Is patient constipated or do they take laxatives? no  Does patient have a history of alcohol/drug use?  no  Is patient on blood thinner such as Coumadin, Plavix and/or Aspirin? yes  Medications: asa 81 mg daily, omeprazole bid, atorvastatin daily  Allergies: nkda  Medication Adjustment per Dr Lindi Adie, NP: asa 2 days  Procedure date & time: 05/04/18 at 730

## 2018-04-13 NOTE — Telephone Encounter (Signed)
agree

## 2018-05-02 ENCOUNTER — Emergency Department (HOSPITAL_COMMUNITY): Payer: Medicare Other

## 2018-05-02 ENCOUNTER — Other Ambulatory Visit: Payer: Self-pay

## 2018-05-02 ENCOUNTER — Emergency Department (HOSPITAL_COMMUNITY)
Admission: EM | Admit: 2018-05-02 | Discharge: 2018-05-02 | Disposition: A | Discharge status: Home / Self Care | Payer: Medicare Other | Attending: Emergency Medicine | Admitting: Emergency Medicine

## 2018-05-02 ENCOUNTER — Encounter (HOSPITAL_COMMUNITY): Payer: Self-pay | Admitting: Emergency Medicine

## 2018-05-02 DIAGNOSIS — Y9389 Activity, other specified: Secondary | ICD-10-CM | POA: Insufficient documentation

## 2018-05-02 DIAGNOSIS — S61221A Laceration with foreign body of left index finger without damage to nail, initial encounter: Secondary | ICD-10-CM | POA: Diagnosis not present

## 2018-05-02 DIAGNOSIS — S6722XA Crushing injury of left hand, initial encounter: Secondary | ICD-10-CM

## 2018-05-02 DIAGNOSIS — Y929 Unspecified place or not applicable: Secondary | ICD-10-CM | POA: Diagnosis not present

## 2018-05-02 DIAGNOSIS — S62611B Displaced fracture of proximal phalanx of left index finger, initial encounter for open fracture: Secondary | ICD-10-CM | POA: Insufficient documentation

## 2018-05-02 DIAGNOSIS — S6742XA Crushing injury of left wrist and hand, initial encounter: Secondary | ICD-10-CM | POA: Diagnosis present

## 2018-05-02 DIAGNOSIS — W230XXA Caught, crushed, jammed, or pinched between moving objects, initial encounter: Secondary | ICD-10-CM | POA: Insufficient documentation

## 2018-05-02 DIAGNOSIS — Z79899 Other long term (current) drug therapy: Secondary | ICD-10-CM | POA: Insufficient documentation

## 2018-05-02 DIAGNOSIS — Y999 Unspecified external cause status: Secondary | ICD-10-CM | POA: Insufficient documentation

## 2018-05-02 DIAGNOSIS — S62601B Fracture of unspecified phalanx of left index finger, initial encounter for open fracture: Secondary | ICD-10-CM

## 2018-05-02 MED ORDER — LIDOCAINE HCL (PF) 2 % IJ SOLN
INTRAMUSCULAR | Status: AC
  Administered 2018-05-02: 10 mL
  Filled 2018-05-02: qty 10

## 2018-05-02 MED ORDER — LIDOCAINE HCL (PF) 2 % IJ SOLN
10.0000 mL | Freq: Once | INTRAMUSCULAR | Status: AC
  Administered 2018-05-02: 10 mL

## 2018-05-02 MED ORDER — DOXYCYCLINE HYCLATE 100 MG PO TABS
100.0000 mg | ORAL_TABLET | Freq: Once | ORAL | Status: AC
  Administered 2018-05-02: 100 mg via ORAL
  Filled 2018-05-02: qty 1

## 2018-05-02 MED ORDER — HYDROCODONE-ACETAMINOPHEN 7.5-325 MG PO TABS: 1.0000 | ORAL_TABLET | ORAL | 0 refills | Status: DC | PRN

## 2018-05-02 MED ORDER — ONDANSETRON HCL 4 MG PO TABS
4.0000 mg | ORAL_TABLET | Freq: Once | ORAL | Status: AC
  Administered 2018-05-02: 4 mg via ORAL
  Filled 2018-05-02: qty 1

## 2018-05-02 MED ORDER — CEPHALEXIN 500 MG PO CAPS
500.0000 mg | ORAL_CAPSULE | Freq: Once | ORAL | Status: AC
  Administered 2018-05-02: 500 mg via ORAL
  Filled 2018-05-02: qty 1

## 2018-05-02 MED ORDER — LIDOCAINE HCL (PF) 2 % IJ SOLN
INTRAMUSCULAR | Status: AC
  Filled 2018-05-02: qty 10

## 2018-05-02 MED ORDER — CEPHALEXIN 500 MG PO CAPS: 500.0000 mg | ORAL_CAPSULE | Freq: Four times a day (QID) | ORAL | 0 refills | Status: DC

## 2018-05-02 MED ORDER — LIDOCAINE HCL (PF) 2 % IJ SOLN
INTRAMUSCULAR | Status: AC
  Administered 2018-05-02: 1 mL
  Filled 2018-05-02: qty 10

## 2018-05-02 NOTE — ED Provider Notes (Signed)
[facility] EMERGENCY DEPARTMENT Provider Note   CSN: [id number] Arrival date & time: 05/02/18  1101    History   Chief Complaint Chief Complaint  Patient presents with   Hand Injury    HPI Anthony Downs is a 68 y.o. male.     Patient is a 68 year old male who presents to the emergency department with injury to the left hand.  The patient states that he was cutting and stacking wood.  A piece of wood came down on his left hand and trapped it on top of a piece of metal.  The patient states that his hand "busted open".  He has been having pain of the hand since that time.  He has been able to control the bleeding by applying pressure and elevating the hand.  Patient denies being on any anticoagulation medications.  He has no history of bleeding disorder.  No other injury reported.  No recent operations or procedures involving the left upper extremity.  The patient states his last tetanus shot was about 3 years ago.     Past Medical History:  Diagnosis Date   Asthma    Subdural hematoma, post-traumatic [facility])     Patient Active Problem List   Diagnosis Date Noted   Special screening for malignant neoplasms, colon 01/09/2018    Past Surgical History:  Procedure Laterality Date   arthroscopic knee     VASECTOMY          Home Medications    Prior to Admission medications   Medication Sig Start Date End Date Taking? Authorizing Provider  atorvastatin (LIPITOR) 10 MG tablet Take 1 tablet by mouth at bedtime. 08/27/16   [provider]  ibuprofen (ADVIL,MOTRIN) 200 MG tablet Take 800 mg by mouth as needed for moderate pain.     [provider]  omeprazole (PRILOSEC) 20 MG capsule Take 40 mg by mouth daily.      [provider]  Propylene Glycol-Glycerin (SOOTHE OP) Apply 1 drop to eye daily as needed (dry eyes).    [provider]    Family History History reviewed. No pertinent family history.  Social History Social  History   Tobacco Use   Smoking status: Never Smoker   Smokeless tobacco: Never Used  Substance Use Topics   Alcohol use: No   Drug use: No     Allergies   Patient has no known allergies.   Review of Systems Review of Systems  Constitutional: Negative for activity change.       All ROS Neg except as noted in HPI  HENT: Negative for nosebleeds.   Eyes: Negative for photophobia and discharge.  Respiratory: Negative for cough, shortness of breath and wheezing.   Cardiovascular: Negative for chest pain and palpitations.  Gastrointestinal: Negative for abdominal pain and blood in stool.  Genitourinary: Negative for dysuria, frequency and hematuria.  Musculoskeletal: Positive for arthralgias. Negative for back pain and neck pain.       Hand injury  Skin: Negative.   Neurological: Negative for dizziness, seizures and speech difficulty.  Psychiatric/Behavioral: Negative for confusion and hallucinations.     Physical Exam Updated Vital Signs BP (!) 132/92 (BP Location: Right Arm)    Pulse 86    Temp 98.2 F (36.8 C) (Oral)    Resp 20    Ht 6' (1.829 m)    Wt (!) 137.9 kg    SpO2 97%    BMI 41.23 kg/m   Physical Exam Vitals signs and nursing  note reviewed.  Constitutional:      Appearance: He is well-developed. He is not toxic-appearing.  HENT:     Head: Normocephalic.     Right Ear: Tympanic membrane and external ear normal.     Left Ear: Tympanic membrane and external ear normal.  Eyes:     General: Lids are normal.     Pupils: Pupils are equal, round, and reactive to light.  Neck:     Musculoskeletal: Normal range of motion and neck supple.     Vascular: No carotid bruit.  Cardiovascular:     Rate and Rhythm: Normal rate and regular rhythm.     Pulses: Normal pulses.     Heart sounds: Normal heart sounds.  Pulmonary:     Effort: No respiratory distress.     Breath sounds: Normal breath sounds.  Abdominal:     General: Bowel sounds are normal.     Palpations:  Abdomen is soft.     Tenderness: There is no abdominal tenderness. There is no guarding.  Musculoskeletal: Normal range of motion.     Comments: There is full range of motion of the left shoulder and elbow.  There is no deformity of the left forearm.  There is good range of motion of the left wrist.  Capillary refill is less than 2 seconds.  The radial pulse is 2+.  There is swelling from the PIP joint of the index finger and the middle finger to the area just beyond the MP joint of the same 2 fingers.  There is tenderness to palpation of the MP joint of the ring finger greater than the MP joint at the middle finger.  There is increased pain and swelling in the webspace between the index finger and the middle finger.  The patient has good flexion and extension of the DIP and PIP joint.  The MP joint area is too painful to complete the testing of flexion and extension.  On the palmar surface there is a 2.3cm laceration.  No tendon involvement appreciated.  Lymphadenopathy:     Head:     Right side of head: No submandibular adenopathy.     Left side of head: No submandibular adenopathy.     Cervical: No cervical adenopathy.  Skin:    General: Skin is warm and dry.  Neurological:     Mental Status: He is alert and oriented to person, place, and time.     Cranial Nerves: No cranial nerve deficit.     Sensory: No sensory deficit.     Comments: There are no sensory deficits of the hand of the left or the right upper extremity.  Patient has distention of soft touch and pain.  No other neurologic changes noted.  Psychiatric:        Speech: Speech normal.      ED Treatments / Results  Labs (all labs ordered are listed, but only abnormal results are displayed) Labs Reviewed - No data to display  EKG None  Radiology Dg Hand Complete Left  Result Date: 05/02/2018 CLINICAL DATA:  68 year old male with traumatic injury EXAM: LEFT HAND - COMPLETE 3+ VIEW COMPARISON:  None. FINDINGS: Comminuted  fracture of the proximal phalanx of the second digit of the left hand. Fracture line extends to the metacarpophalangeal joint. Soft tissue swelling of the finger and the palm on the lateral view. No radiopaque foreign body. No subluxation/dislocation. Degenerative changes of the interphalangeal joints. No second fracture site identified. IMPRESSION: Comminuted fracture of the proximal  phalanx of the second digit of the left hand, with the proximal fracture line extending to the MCP. Soft tissue swelling. Electronically Signed   By: Corrie Mckusick D.O.   On: 05/02/2018 12:18    Procedures .Marland KitchenLaceration Repair Date/Time: 05/02/2018 12:36 PM Performed by: Lily Kocher, PA-C Authorized by: Lily Kocher, PA-C   Consent:    Consent obtained:  Verbal   Consent given by:  Patient   Risks discussed:  Infection, pain, retained foreign body, tendon damage, poor cosmetic result and poor wound healing   Alternatives discussed:  Referral Anesthesia (see MAR for exact dosages):    Anesthesia method:  Nerve block   Block location:  Keft index finger   Block needle gauge:  25 G   Block anesthetic:  Lidocaine 2% w/o epi   Block injection procedure:  Anatomic landmarks identified, introduced needle, incremental injection and negative aspiration for blood   Block outcome:  Anesthesia achieved Laceration details:    Location:  Finger   Finger location:  L index finger   Length (cm):  2.3 Repair type:    Repair type:  Intermediate Pre-procedure details:    Preparation:  Patient was prepped and draped in usual sterile fashion and imaging obtained to evaluate for foreign bodies Exploration:    Hemostasis achieved with:  Direct pressure   Wound exploration: wound explored through full range of motion     Wound extent: underlying fracture     Wound extent: no foreign bodies/material noted, no nerve damage noted and no tendon damage noted     Contaminated: yes   Treatment:    Area cleansed with:   Betadine   Amount of cleaning:  Extensive   Irrigation solution:  Sterile saline   Irrigation volume:  1.5   Visualized foreign bodies/material removed: yes   Skin repair:    Repair method:  Sutures   Suture size:  4-0   Wound skin closure material used: Vicryl Rapide.   Suture technique:  Simple interrupted   Number of sutures:  10 Approximation:    Approximation:  Close Post-procedure details:    Dressing:  Sterile dressing and splint for protection   Patient tolerance of procedure:  Tolerated well, no immediate complications   (including critical care time)   FRACTURE CARE LEFT INDEX FINGER.  Patient sustained a fracture of the left index finger near the MP joint after a piece of wood fell and hit the hand causing a crush injury.  I discussed the x-ray and the fracture with the patient in terms which he understood.  I discussed with him the need for repair of the laceration that also involve the left hand, and I described for him the need for immobilization and splinting.  The patient gives verbal consent for this procedure.  Patient identified by armband.  Procedural timeout taken.  The wound was painted with Betadine, and a digital block was achieved with 2% plain lidocaine.  After appropriate anesthesia, the wound was irrigated.  There were few pieces of foreign body present.  These were irrigated out.  The wound was then probed and no further foreign body was appreciated.  After the laceration was repaired and bandage was applied.  The index finger and middle finger were buddy taped and a plaster splint was applied.  After the splint was applied the capillary refill was noted to be less than 2 seconds.  There was no sensation of the splint being too tight.  There are no temperature changes appreciated.  A  prescription for medication for pain was given to the patient.  The patient tolerated the procedure without problem. Medications Ordered in ED Medications  lidocaine  (XYLOCAINE) 2 % injection 10 mL (has no administration in time range)  lidocaine (XYLOCAINE) 2 % injection (has no administration in time range)  lidocaine (XYLOCAINE) 2 % injection (has no administration in time range)     Initial Impression / Assessment and Plan / ED Course  I have reviewed the triage vital signs and the nursing notes.  Pertinent labs & imaging results that were available during my care of the patient were reviewed by me and considered in my medical decision making (see chart for details).         Final Clinical Impressions(s) / ED Diagnoses mdm  Patient sustained injury with laceration and fracture to the left hand, and particularly the index finger of the left hand.  There were no neurologic or major vascular deficits appreciated on the examination.  The patient's tetanus is up-to-date.  The laceration on the palmar surface was repaired with 10 interrupted sutures of 4-0 Vicryl-Rapide.  A splint was in place for assistance with the fracture.  Ice pack was provided.  Case was discussed with Dr. Lita Mains.  Case was discussed with Dr. Amedeo Plenty.  Dr. Amedeo Plenty will see the patient on tomorrow, April 15 at 9 a.m.Marland Kitchen  Prescription for Keflex given to the patient.  Prescription for medication for pain also given to the patient.  Patient is in agreement with this plan.   Final diagnoses:  Crushing injury of left hand and finger, initial encounter  Open displaced fracture of phalanx of left index finger, unspecified phalanx, initial encounter    ED Discharge Orders         Ordered    cephALEXin (KEFLEX) 500 MG capsule  4 times daily     05/02/18 1417    HYDROcodone-acetaminophen (NORCO) 7.5-325 MG tablet  Every 4 hours PRN     05/02/18 1417           Lily Kocher, PA-C 05/02/18 2113    Julianne Rice, MD 05/05/18 [phone]

## 2018-05-02 NOTE — ED Notes (Signed)
Patient has <3 sec cap refill in all fingers of left hand where splint was applied.

## 2018-05-02 NOTE — Discharge Instructions (Addendum)
Your x-ray shows a fracture of the left index finger that involves 1 of the joints of your finger.  You have a laceration that has been repaired in the same area.  The laceration was repaired with dissolvable stitches.  These will come out in about 7 to 10 days on their own.  It is important that you keep your dressing in your splint clean and dry.  If you should get in the shower, please put a bag over your hand and apply a twisty tie or rubber band around the wrist or forearm area to keep the suture area clean and dry.  Keep your hand elevated above your heart is much as possible.  Use Keflex with breakfast, lunch, dinner, and at bedtime.  Use Tylenol extra strength for mild pain, use Norco for more severe pain. This medication may cause drowsiness. Please do not drink, drive, or participate in activity that requires concentration while taking this medication.  Dr.Gramig is the hand specialist for today.  He would like to see you at 9:00 on tomorrow, Wednesday, April 15 in his office.

## 2018-05-02 NOTE — ED Triage Notes (Signed)
Patient states he had a piece of wood come down on his left hand and "busted it open." Bleeding controlled at this time.

## 2018-07-13 ENCOUNTER — Other Ambulatory Visit: Payer: Self-pay

## 2018-07-13 ENCOUNTER — Other Ambulatory Visit: Payer: Medicare Other

## 2018-07-13 DIAGNOSIS — Z20822 Contact with and (suspected) exposure to covid-19: Secondary | ICD-10-CM

## 2018-07-19 LAB — NOVEL CORONAVIRUS, NAA: SARS-CoV-2, NAA: NOT DETECTED

## 2018-09-20 DIAGNOSIS — Z1211 Encounter for screening for malignant neoplasm of colon: Principal | ICD-10-CM

## 2018-10-20 ENCOUNTER — Encounter (INDEPENDENT_AMBULATORY_CARE_PROVIDER_SITE_OTHER): Payer: Self-pay | Admitting: *Deleted

## 2018-11-01 ENCOUNTER — Telehealth (INDEPENDENT_AMBULATORY_CARE_PROVIDER_SITE_OTHER): Payer: Self-pay | Admitting: *Deleted

## 2018-11-01 ENCOUNTER — Other Ambulatory Visit: Payer: Self-pay

## 2018-11-01 ENCOUNTER — Ambulatory Visit (INDEPENDENT_AMBULATORY_CARE_PROVIDER_SITE_OTHER): Payer: Self-pay

## 2018-11-01 NOTE — Telephone Encounter (Signed)
Referring MD/PCP: barrino   Procedure: tcs  Reason/Indication:  screening  Has patient had this procedure before?  Yes, over 10 yrs ago  If so, when, by whom and where?    Is there a family history of colon cancer?  no  Who?  What age when diagnosed?    Is patient diabetic?   no      Does patient have prosthetic heart valve or mechanical valve?  no  Do you have a pacemaker/defibrillator?  no  Has patient ever had endocarditis/atrial fibrillation? no  Does patient use oxygen? no  Has patient had joint replacement within last 12 months?  no  Is patient constipated or do they take laxatives? no  Does patient have a history of alcohol/drug use?  no  Is patient on blood thinner such as Coumadin, Plavix and/or Aspirin? yes  Medications: asa 81 mg daily, omeprazole bid, atorvastatin daily  Allergies: nkda  Medication Adjustment per Dr Charlena Cross, NP: asa 2 days  Procedure date & time: 11/29/18 at 830

## 2018-11-27 ENCOUNTER — Other Ambulatory Visit (HOSPITAL_COMMUNITY)
Admission: RE | Admit: 2018-11-27 | Discharge: 2018-11-27 | Disposition: A | Discharge status: Home / Self Care | Payer: Medicare Other | Source: Ambulatory Visit | Attending: Internal Medicine | Admitting: Internal Medicine

## 2018-11-27 ENCOUNTER — Other Ambulatory Visit: Payer: Self-pay

## 2018-11-27 DIAGNOSIS — Z01812 Encounter for preprocedural laboratory examination: Secondary | ICD-10-CM | POA: Diagnosis present

## 2018-11-27 DIAGNOSIS — Z20828 Contact with and (suspected) exposure to other viral communicable diseases: Secondary | ICD-10-CM | POA: Diagnosis not present

## 2018-11-27 LAB — SARS CORONAVIRUS 2 (TAT 6-24 HRS): SARS Coronavirus 2: NEGATIVE

## 2018-11-29 ENCOUNTER — Other Ambulatory Visit: Payer: Self-pay

## 2018-11-29 ENCOUNTER — Encounter (HOSPITAL_COMMUNITY): Payer: Self-pay | Admitting: *Deleted

## 2018-11-29 ENCOUNTER — Encounter (HOSPITAL_COMMUNITY)
Admission: RE | Disposition: A | Discharge status: Home / Self Care | Payer: Self-pay | Source: Home / Self Care | Attending: Internal Medicine

## 2018-11-29 ENCOUNTER — Ambulatory Visit (HOSPITAL_COMMUNITY)
Admission: RE | Admit: 2018-11-29 | Discharge: 2018-11-29 | Disposition: A | Discharge status: Home / Self Care | Payer: Medicare Other | Attending: Internal Medicine | Admitting: Internal Medicine

## 2018-11-29 DIAGNOSIS — D120 Benign neoplasm of cecum: Secondary | ICD-10-CM | POA: Insufficient documentation

## 2018-11-29 DIAGNOSIS — G4730 Sleep apnea, unspecified: Secondary | ICD-10-CM | POA: Diagnosis not present

## 2018-11-29 DIAGNOSIS — Z79899 Other long term (current) drug therapy: Secondary | ICD-10-CM | POA: Insufficient documentation

## 2018-11-29 DIAGNOSIS — Z7982 Long term (current) use of aspirin: Secondary | ICD-10-CM | POA: Diagnosis not present

## 2018-11-29 DIAGNOSIS — J45909 Unspecified asthma, uncomplicated: Secondary | ICD-10-CM | POA: Insufficient documentation

## 2018-11-29 DIAGNOSIS — K648 Other hemorrhoids: Secondary | ICD-10-CM | POA: Diagnosis not present

## 2018-11-29 DIAGNOSIS — Z1211 Encounter for screening for malignant neoplasm of colon: Secondary | ICD-10-CM | POA: Diagnosis not present

## 2018-11-29 HISTORY — PX: COLONOSCOPY: SHX5424

## 2018-11-29 SURGERY — COLONOSCOPY
Anesthesia: Moderate Sedation

## 2018-11-29 MED ORDER — MEPERIDINE HCL 50 MG/ML IJ SOLN
INTRAMUSCULAR | Status: DC | PRN
  Administered 2018-11-29 (×2): 25 mg

## 2018-11-29 MED ORDER — MIDAZOLAM HCL 5 MG/5ML IJ SOLN
INTRAMUSCULAR | Status: AC
  Filled 2018-11-29: qty 10

## 2018-11-29 MED ORDER — SODIUM CHLORIDE 0.9 % IV SOLN
INTRAVENOUS | Status: DC
  Administered 2018-11-29: 1000 mL via INTRAVENOUS

## 2018-11-29 MED ORDER — MEPERIDINE HCL 50 MG/ML IJ SOLN
INTRAMUSCULAR | Status: AC
  Filled 2018-11-29: qty 1

## 2018-11-29 MED ORDER — MIDAZOLAM HCL 5 MG/5ML IJ SOLN
INTRAMUSCULAR | Status: DC | PRN
  Administered 2018-11-29 (×2): 1 mg via INTRAVENOUS
  Administered 2018-11-29 (×2): 2 mg via INTRAVENOUS

## 2018-11-29 MED ORDER — STERILE WATER FOR IRRIGATION IR SOLN
Status: DC | PRN
  Administered 2018-11-29: 1.5 mL

## 2018-11-29 NOTE — H&P (Addendum)
Anthony Downs is an 68 y.o. male.   Chief Complaint: Patient is here for colonoscopy. HPI: Patient is 68 year old Caucasian male who is here for screening colonoscopy.  He denies abdominal pain change in bowel habits or rectal bleeding.  Last colonoscopy was normal 10 years ago at [facility] in [facility]. Family history is negative for CRC. Last aspirin dose was 2 days ago.  He may take ibuprofen once or twice a month.  Past Medical History:  Diagnosis Date  . Asthma   . Subdural hematoma, post-traumatic (HCC)        Sleep apnea.  Past Surgical History:  Procedure Laterality Date  . arthroscopic knee    . VASECTOMY      Family History  Problem Relation Age of Onset  . Breast cancer Mother   . Aortic aneurysm Father    Social History:  reports that he has never smoked. He has never used smokeless tobacco. He reports that he does not drink alcohol or use drugs.  Allergies: No Known Allergies  Medications Prior to Admission  Medication Sig Dispense Refill  . amLODipine (NORVASC) 5 MG tablet Take 5 mg by mouth daily.    Marland Kitchen aspirin EC 81 MG tablet Take 81 mg by mouth daily.    Marland Kitchen atorvastatin (LIPITOR) 10 MG tablet Take 10 mg by mouth at bedtime.     . docusate sodium (COLACE) 100 MG capsule Take 100 mg by mouth See admin instructions. Tale 200 mg in the morning and 100 mg in the evening    . ibuprofen (ADVIL,MOTRIN) 200 MG tablet Take 400-600 mg by mouth daily as needed for moderate pain.     Marland Kitchen omeprazole (PRILOSEC) 20 MG capsule Take 40 mg by mouth daily.      Marland Kitchen Propylene Glycol-Glycerin (SOOTHE OP) Apply 1 drop to eye daily as needed (dry eyes).    . cephALEXin (KEFLEX) 500 MG capsule Take 1 capsule (500 mg total) by mouth 4 (four) times daily. (Patient not taking: Reported on 11/20/2018) 28 capsule 0  . HYDROcodone-acetaminophen (NORCO) 7.5-325 MG tablet Take 1 tablet by mouth every 4 (four) hours as needed. (Patient not taking: Reported on 11/20/2018) 15 tablet 0    No  results found for this or any previous visit (from the past 48 hour(s)). No results found.  ROS  Blood pressure (!) 146/92, pulse 77, temperature 98.3 F (36.8 C), temperature source Axillary, resp. rate 13, height 6' (1.829 m), weight (!) 136.5 kg, SpO2 96 %. Physical Exam  Constitutional: He appears well-developed and well-nourished.  HENT:  Mouth/Throat: Oropharynx is clear and moist.  Eyes: Conjunctivae are normal. No scleral icterus.  Neck: No thyromegaly present.  Cardiovascular: Normal rate, regular rhythm and normal heart sounds.  No murmur heard. Respiratory: Effort normal and breath sounds normal.  GI:  Abdomen is full.  Soft and nontender without organomegaly or masses.  Musculoskeletal:        General: No edema.  Lymphadenopathy:    He has no cervical adenopathy.  Neurological: He is alert.  Skin: Skin is warm and dry.     Assessment/Plan Average risk screening colonoscopy.  Hildred Laser, MD 11/29/2018, 8:17 AM

## 2018-11-29 NOTE — Op Note (Signed)
[facility] Patient Name: Anthony Downs Procedure Date: 11/29/2018 8:08 AM MRN: [id number] Date of Birth: 07-Oct-1950 Attending MD: Hildred Laser , MD CSN: [id number] Age: 68 Admit Type: Outpatient Procedure:                Colonoscopy Indications:              Screening for colorectal malignant neoplasm Providers:                Hildred Laser, MD, Gerome Sam, RN, Randa Spike, Technician Referring MD:             Chapman Fitch, MD Medicines:                Midazolam 6 mg IV, Meperidine 50 mg IV Complications:            No immediate complications. Estimated Blood Loss:     Estimated blood loss: none. Procedure:                Pre-Anesthesia Assessment:                           - Prior to the procedure, a History and Physical                            was performed, and patient medications and                            allergies were reviewed. The patient's tolerance of                            previous anesthesia was also reviewed. The risks                            and benefits of the procedure and the sedation                            options and risks were discussed with the patient.                            All questions were answered, and informed consent                            was obtained. Prior Anticoagulants: The patient has                            taken no previous anticoagulant or antiplatelet                            agents except for aspirin and has taken no previous                            anticoagulant or antiplatelet agents except for  NSAID medication. ASA Grade Assessment: II - A                            patient with mild systemic disease. After reviewing                            the risks and benefits, the patient was deemed in                            satisfactory condition to undergo the procedure.                           After obtaining informed consent, the  colonoscope                            was passed under direct vision. Throughout the                            procedure, the patient's blood pressure, pulse, and                            oxygen saturations were monitored continuously. The                            PCF-H190DL [id number]) scope was introduced through                            the anus and advanced to the the cecum, identified                            by appendiceal orifice and ileocecal valve. The                            colonoscopy was somewhat difficult due to                            significant looping. Successful completion of the                            procedure was aided by changing the patient to a                            supine position, using manual pressure and                            withdrawing and reinserting the scope. The patient                            tolerated the procedure well. The quality of the                            bowel preparation was good. The ileocecal valve,  appendiceal orifice, and rectum were photographed. Scope In: 8:25:19 AM Scope Out: 8:53:50 AM Scope Withdrawal Time: 0 hours 11 minutes 39 seconds  Total Procedure Duration: 0 hours 28 minutes 31 seconds  Findings:      The perianal and digital rectal examinations were normal.      A 9 mm polyp was found in the cecum. The polyp was semi-sessile. The       polyp was removed with a hot snare. Resection and retrieval were       complete. The pathology specimen was placed into Bottle Number 1.      The exam was otherwise normal throughout the examined colon.      Internal hemorrhoids were found during retroflexion. The hemorrhoids       were small. Impression:               - One 9 mm polyp in the cecum, removed with a hot                            snare. Resected and retrieved.                           - Internal hemorrhoids. Moderate Sedation:      Moderate (conscious) sedation was  administered by the endoscopy nurse       and supervised by the endoscopist. The following parameters were       monitored: oxygen saturation, heart rate, blood pressure, CO2       capnography and response to care. Total physician intraservice time was       33 minutes. Recommendation:           - Patient has a contact number available for                            emergencies. The signs and symptoms of potential                            delayed complications were discussed with the                            patient. Return to normal activities tomorrow.                            Written discharge instructions were provided to the                            patient.                           - Resume previous diet today.                           - Continue present medications.                           - No aspirin, ibuprofen, naproxen, or other                            non-steroidal anti-inflammatory drugs for 7 days.                           -  Await pathology results.                           - Repeat colonoscopy is recommended. The                            colonoscopy date will be determined after pathology                            results from today's exam become available for                            review. Procedure Code(s):        --- Professional ---                           [phone], Colonoscopy, flexible; with removal of                            tumor(s), polyp(s), or other lesion(s) by snare                            technique                           99153, Moderate sedation; each additional 15                            minutes intraservice time                           G0500, Moderate sedation services provided by the                            same physician or other qualified health care                            professional performing a gastrointestinal                            endoscopic service that sedation supports,                             requiring the presence of an independent trained                            observer to assist in the monitoring of the                            patient's level of consciousness and physiological                            status; initial 15 minutes of intra-service time;                            patient age 63 years or older (additional  time may                            be reported with [phone], as appropriate) Diagnosis Code(s):        --- Professional ---                           Z12.11, Encounter for screening for malignant                            neoplasm of colon                           K63.5, Polyp of colon                           K64.8, Other hemorrhoids CPT copyright 2019 American Medical Association. All rights reserved. The codes documented in this report are preliminary and upon coder review may  be revised to meet current compliance requirements. Hildred Laser, MD Hildred Laser, MD 11/29/2018 9:02:12 AM This report has been signed electronically. Number of Addenda: 0

## 2018-11-29 NOTE — Discharge Instructions (Signed)
No aspirin or NSAIDs for 1 week. Resume other medications as before. Resume usual diet. No driving for 24 hours. Physician will call with biopsy results.    Colonoscopy, Adult, Care After This sheet gives you information about how to care for yourself after your procedure. Your doctor may also give you more specific instructions. If you have problems or questions, call your doctor. What can I expect after the procedure? After the procedure, it is common to have:  A small amount of blood in your poop for 24 hours.  Some gas.  Mild cramping or bloating in your belly. Follow these instructions at home: General instructions  For the first 24 hours after the procedure: ? Do not drive or use machinery. ? Do not sign important documents. ? Do not drink alcohol. ? Do your daily activities more slowly than normal. ? Eat foods that are soft and easy to digest.  Take over-the-counter or prescription medicines only as told by your doctor. To help cramping and bloating:   Try walking around.  Put heat on your belly (abdomen) as told by your doctor. Use a heat source that your doctor recommends, such as a moist heat pack or a heating pad. ? Put a towel between your skin and the heat source. ? Leave the heat on for 20-30 minutes. ? Remove the heat if your skin turns bright red. This is especially important if you cannot feel pain, heat, or cold. You can get burned. Eating and drinking   Drink enough fluid to keep your pee (urine) clear or pale yellow.  Return to your normal diet as told by your doctor. Avoid heavy or fried foods that are hard to digest.  Avoid drinking alcohol for as long as told by your doctor. Contact a doctor if:  You have blood in your poop (stool) 2-3 days after the procedure. Get help right away if:  You have more than a small amount of blood in your poop.  You see large clumps of tissue (blood clots) in your poop.  Your belly is swollen.  You feel  sick to your stomach (nauseous).  You throw up (vomit).  You have a fever.  You have belly pain that gets worse, and medicine does not help your pain. Summary  After the procedure, it is common to have a small amount of blood in your poop. You may also have mild cramping and bloating in your belly.  For the first 24 hours after the procedure, do not drive or use machinery, do not sign important documents, and do not drink alcohol.  Get help right away if you have a lot of blood in your poop, feel sick to your stomach, have a fever, or have more belly pain. This information is not intended to replace advice given to you by your health care provider. Make sure you discuss any questions you have with your health care provider. Document Released: 02/06/2010 Document Revised: 11/04/2016 Document Reviewed: 09/29/2015 Elsevier Patient Education  2020 [employer].    Colon Polyps  Polyps are tissue growths inside the body. Polyps can grow in many places, including the large intestine (colon). A polyp may be a round bump or a mushroom-shaped growth. You could have one polyp or several. Most colon polyps are noncancerous (benign). However, some colon polyps can become cancerous over time. Finding and removing the polyps early can help prevent this. What are the causes? The exact cause of colon polyps is not known. What increases the risk?  You are more likely to develop this condition if you:  Have a family history of colon cancer or colon polyps.  Are older than 17 or older than 45 if you are African American.  Have inflammatory bowel disease, such as ulcerative colitis or Crohn's disease.  Have certain hereditary conditions, such as: ? Familial adenomatous polyposis. ? Lynch syndrome. ? Turcot syndrome. ? Peutz-Jeghers syndrome.  Are overweight.  Smoke cigarettes.  Do not get enough exercise.  Drink too much alcohol.  Eat a diet that is high in fat and red meat and low in  fiber.  Had childhood cancer that was treated with abdominal radiation. What are the signs or symptoms? Most polyps do not cause symptoms. If you have symptoms, they may include:  Blood coming from your rectum when having a bowel movement.  Blood in your stool. The stool may look dark red or black.  Abdominal pain.  A change in bowel habits, such as constipation or diarrhea. How is this diagnosed? This condition is diagnosed with a colonoscopy. This is a procedure in which a lighted, flexible scope is inserted into the anus and then passed into the colon to examine the area. Polyps are sometimes found when a colonoscopy is done as part of routine cancer screening tests. How is this treated? Treatment for this condition involves removing any polyps that are found. Most polyps can be removed during a colonoscopy. Those polyps will then be tested for cancer. Additional treatment may be needed depending on the results of testing. Follow these instructions at home: Lifestyle  Maintain a healthy weight, or lose weight if recommended by your health care provider.  Exercise every day or as told by your health care provider.  Do not use any products that contain nicotine or tobacco, such as cigarettes and e-cigarettes. If you need help quitting, ask your health care provider.  If you drink alcohol, limit how much you have: ? 0-1 drink a day for women. ? 0-2 drinks a day for men.  Be aware of how much alcohol is in your drink. In the [place] one drink equals one 12 oz bottle of beer (355 mL), one 5 oz glass of wine (148 mL), or one 1 oz shot of hard liquor (44 mL). Eating and drinking   Eat foods that are high in fiber, such as fruits, vegetables, and whole grains.  Eat foods that are high in calcium and vitamin D, such as milk, cheese, yogurt, eggs, liver, fish, and broccoli.  Limit foods that are high in fat, such as fried foods and desserts.  Limit the amount of red meat and  processed meat you eat, such as hot dogs, sausage, bacon, and lunch meats. General instructions  Keep all follow-up visits as told by your health care provider. This is important. ? This includes having regularly scheduled colonoscopies. ? Talk to your health care provider about when you need a colonoscopy. Contact a health care provider if:  You have new or worsening bleeding during a bowel movement.  You have new or increased blood in your stool.  You have a change in bowel habits.  You lose weight for no known reason. Summary  Polyps are tissue growths inside the body. Polyps can grow in many places, including the colon.  Most colon polyps are noncancerous (benign), but some can become cancerous over time.  This condition is diagnosed with a colonoscopy.  Treatment for this condition involves removing any polyps that are found. Most polyps can be  removed during a colonoscopy. This information is not intended to replace advice given to you by your health care provider. Make sure you discuss any questions you have with your health care provider. Document Released: 10/01/2003 Document Revised: 04/21/2017 Document Reviewed: 04/21/2017 Elsevier Patient Education  2020 [employer].

## 2018-11-30 LAB — SURGICAL PATHOLOGY

## 2018-12-04 ENCOUNTER — Encounter (HOSPITAL_COMMUNITY): Payer: Self-pay | Admitting: Internal Medicine

## 2019-12-21 ENCOUNTER — Encounter: Payer: Self-pay | Admitting: Orthopedic Surgery

## 2019-12-21 ENCOUNTER — Ambulatory Visit (INDEPENDENT_AMBULATORY_CARE_PROVIDER_SITE_OTHER): Payer: Medicare Other | Admitting: Orthopedic Surgery

## 2019-12-21 ENCOUNTER — Other Ambulatory Visit: Payer: Self-pay

## 2019-12-21 ENCOUNTER — Ambulatory Visit: Payer: Medicare Other

## 2019-12-21 VITALS — BP 139/74 | HR 80 | Ht 72.0 in | Wt 302.0 lb

## 2019-12-21 DIAGNOSIS — M19019 Primary osteoarthritis, unspecified shoulder: Secondary | ICD-10-CM

## 2019-12-21 DIAGNOSIS — M25511 Pain in right shoulder: Secondary | ICD-10-CM

## 2019-12-21 DIAGNOSIS — M25561 Pain in right knee: Secondary | ICD-10-CM

## 2019-12-21 DIAGNOSIS — Z6841 Body Mass Index (BMI) 40.0 and over, adult: Secondary | ICD-10-CM | POA: Diagnosis not present

## 2019-12-21 DIAGNOSIS — M19011 Primary osteoarthritis, right shoulder: Secondary | ICD-10-CM

## 2019-12-21 NOTE — Patient Instructions (Signed)

## 2019-12-21 NOTE — Progress Notes (Signed)
New Patient Visit  Assessment: Anthony Downs is a 69 y.o. RHD male with the following: Right glenohumeral arthritis  Plan: Mr. Haak has right shoulder, glenohumeral arthritis.  We reviewed radiographs in clinic today, and coupled with his physical exam, I do not think he has a significant injury to his rotator cuff.  We discussed treatment options, including symptomatic relief.  As a result, he has elected to proceed with a steroid injection.  No follow up is needed.  If effective, he could return for another injection in 3 months time.    Procedure note injection Right shoulder    Verbal consent was obtained to inject the right shoulder, subacromial space Timeout was completed to confirm the site of injection.  The skin was prepped with alcohol and ethyl chloride was sprayed at the injection site.  A 21-gauge needle was used to inject 40 mg of Depo-Medrol and 1% lidocaine (3 cc) into the subacromial space of the right shoulder using a posterolateral approach.  There were no complications. A sterile bandage was applied.   Follow-up: Return if symptoms worsen or fail to improve.  Subjective:  Chief Complaint  Patient presents with  . Shoulder Pain    right shoulder pain, worse in last few weeks.     History of Present Illness: Anthony Downs is a 69 y.o. RHD male who presents for evaluation of his right shoulder.  He denies a specific injury to his shoulder.  He describes an aching pain that keeps him up at night.  He has had right shoulder pain for years, but it has been getting worse in recent weeks.  He will take ibuprofen for pain.  No recent PT.  No previous injections.  He continues with his usual activity, but typically listens to his shoulder and stop if it becomes painful.    Review of Systems: No fevers or chills No numbness or tingling No chest pain No shortness of breath No bowel or bladder dysfunction No GI distress No headaches   Medical  History:  Past Medical History:  Diagnosis Date  . Asthma   . Subdural hematoma, post-traumatic [facility])     Past Surgical History:  Procedure Laterality Date  . arthroscopic knee    . COLONOSCOPY N/A 11/29/2018   Procedure: COLONOSCOPY;  Surgeon: Rogene Houston, MD;  Location: [facility];  Service: Endoscopy;  Laterality: N/A;  730-rescheduled 9/2 same time per Lelon Frohlich  . VASECTOMY      Family History  Problem Relation Age of Onset  . Breast cancer Mother   . Aortic aneurysm Father    Social History   Tobacco Use  . Smoking status: Never Smoker  . Smokeless tobacco: Never Used  Vaping Use  . Vaping Use: Never used  Substance Use Topics  . Alcohol use: No  . Drug use: No    No Known Allergies  Current Meds  Medication Sig  . amLODipine (NORVASC) 10 MG tablet Take 10 mg by mouth daily.  Marland Kitchen aspirin EC 81 MG tablet Take 1 tablet (81 mg total) by mouth daily.  Marland Kitchen atorvastatin (LIPITOR) 10 MG tablet Take 10 mg by mouth at bedtime.   . docusate sodium (COLACE) 100 MG capsule Take 100 mg by mouth See admin instructions. Tale 200 mg in the morning and 100 mg in the evening  . ibuprofen (ADVIL) 200 MG tablet Take 2-3 tablets (400-600 mg total) by mouth daily as needed for moderate pain.  Marland Kitchen Propylene Glycol-Glycerin (SOOTHE OP) Apply  1 drop to eye daily as needed (dry eyes).  . [DISCONTINUED] omeprazole (PRILOSEC) 20 MG capsule Take 40 mg by mouth daily.      Objective: BP 139/74   Pulse 80   Ht 6' (1.829 m)   Wt (!) 302 lb (137 kg)   BMI 40.96 kg/m   Physical Exam:  General: Alert and oriented, no acute distress Gait: Normal  Right shoulder - no deformity or atrophy.  Near full ROM with some pain throughout arc.  Good strength, but strength testing elicits pain.  Limited IR and ER in 90/90 position.  Sensation intact distally. Fingers are warm and well perfused.  Negative belly press. No extensor lag.  Tolerates empty can position.     IMAGING: I personally ordered  and reviewed the following images   Right shoulder XR demonstrates loss of glenohumeral joint space.  Small osteophytes on inferior aspect of humeral head and glenoid.  No proximal humeral migration.   Impression: Moderate right shoulder arthritis   New Medications:  No orders of the defined types were placed in this encounter.     Mordecai Rasmussen, MD  12/21/2019 10:25 PM

## 2020-01-14 ENCOUNTER — Other Ambulatory Visit: Payer: Self-pay | Admitting: Otolaryngology

## 2020-03-17 ENCOUNTER — Other Ambulatory Visit (HOSPITAL_COMMUNITY)
Admission: RE | Admit: 2020-03-17 | Discharge: 2020-03-17 | Disposition: A | Discharge status: Home / Self Care | Payer: Medicare Other | Source: Ambulatory Visit | Attending: Otolaryngology | Admitting: Otolaryngology

## 2020-03-17 DIAGNOSIS — Z20822 Contact with and (suspected) exposure to covid-19: Secondary | ICD-10-CM | POA: Insufficient documentation

## 2020-03-17 DIAGNOSIS — Z01812 Encounter for preprocedural laboratory examination: Secondary | ICD-10-CM | POA: Diagnosis present

## 2020-03-18 ENCOUNTER — Encounter (HOSPITAL_COMMUNITY): Payer: Self-pay | Admitting: Otolaryngology

## 2020-03-18 LAB — SARS CORONAVIRUS 2 (TAT 6-24 HRS): SARS Coronavirus 2: NEGATIVE

## 2020-03-18 NOTE — Progress Notes (Signed)
Spoke with pt and his wife for pre-op call. Pt has hx of sleep apnea (untreated) and Hypertension. He has shortness of breath, but states it's because both of his nasal passages are blocked 90%.   I have requested an EKG for [facility].   Pt is not diabetic.  Covid test done 03/17/20 and it's negative. Pt states he's been in quarantine since the test was done and understands that he stays in quarantine until he comes to the hospital tomorrow.

## 2020-03-19 ENCOUNTER — Other Ambulatory Visit: Payer: Self-pay

## 2020-03-19 ENCOUNTER — Encounter (HOSPITAL_COMMUNITY): Payer: Self-pay | Admitting: Otolaryngology

## 2020-03-19 ENCOUNTER — Encounter (HOSPITAL_COMMUNITY)
Admission: RE | Disposition: A | Discharge status: Home / Self Care | Payer: Self-pay | Source: Home / Self Care | Attending: Otolaryngology

## 2020-03-19 ENCOUNTER — Ambulatory Visit (HOSPITAL_COMMUNITY): Payer: Medicare Other | Admitting: Anesthesiology

## 2020-03-19 ENCOUNTER — Ambulatory Visit (HOSPITAL_COMMUNITY)
Admission: RE | Admit: 2020-03-19 | Discharge: 2020-03-19 | Disposition: A | Discharge status: Home / Self Care | Payer: Medicare Other | Attending: Otolaryngology | Admitting: Otolaryngology

## 2020-03-19 DIAGNOSIS — J343 Hypertrophy of nasal turbinates: Secondary | ICD-10-CM | POA: Diagnosis not present

## 2020-03-19 DIAGNOSIS — J3489 Other specified disorders of nose and nasal sinuses: Secondary | ICD-10-CM | POA: Insufficient documentation

## 2020-03-19 DIAGNOSIS — K219 Gastro-esophageal reflux disease without esophagitis: Secondary | ICD-10-CM | POA: Insufficient documentation

## 2020-03-19 DIAGNOSIS — Z9889 Other specified postprocedural states: Secondary | ICD-10-CM

## 2020-03-19 DIAGNOSIS — J342 Deviated nasal septum: Secondary | ICD-10-CM | POA: Diagnosis not present

## 2020-03-19 DIAGNOSIS — J45909 Unspecified asthma, uncomplicated: Secondary | ICD-10-CM | POA: Diagnosis not present

## 2020-03-19 DIAGNOSIS — G4730 Sleep apnea, unspecified: Secondary | ICD-10-CM | POA: Diagnosis not present

## 2020-03-19 HISTORY — DX: Other complications of anesthesia, initial encounter: T88.59XA

## 2020-03-19 HISTORY — DX: Unspecified osteoarthritis, unspecified site: M19.90

## 2020-03-19 HISTORY — DX: Essential (primary) hypertension: I10

## 2020-03-19 HISTORY — DX: Sleep apnea, unspecified: G47.30

## 2020-03-19 HISTORY — DX: Malignant (primary) neoplasm, unspecified: C80.1

## 2020-03-19 HISTORY — DX: Gastro-esophageal reflux disease without esophagitis: K21.9

## 2020-03-19 HISTORY — PX: NASAL SEPTOPLASTY W/ TURBINOPLASTY: SHX2070

## 2020-03-19 LAB — BASIC METABOLIC PANEL
Anion gap: 8 (ref 5–15)
BUN: 13 mg/dL (ref 8–23)
CO2: 25 mmol/L (ref 22–32)
Calcium: 9.1 mg/dL (ref 8.9–10.3)
Chloride: 108 mmol/L (ref 98–111)
Creatinine, Ser: 1.11 mg/dL (ref 0.61–1.24)
GFR, Estimated: >60 mL/min (ref >60)
Glucose, Bld: 115 mg/dL — ABNORMAL HIGH (ref 70–99)
Potassium: 4.3 mmol/L (ref 3.5–5.1)
Sodium: 141 mmol/L (ref 135–145)

## 2020-03-19 LAB — CBC
HCT: 42.5 % (ref 39.0–52.0)
Hemoglobin: 13.7 g/dL (ref 13.0–17.0)
MCH: 30.9 pg (ref 26.0–34.0)
MCHC: 32.2 g/dL (ref 30.0–36.0)
MCV: 95.7 fL (ref 80.0–100.0)
Platelets: 183 10*3/uL (ref 150–400)
RBC: 4.44 MIL/uL (ref 4.22–5.81)
RDW: 12.8 % (ref 11.5–15.5)
WBC: 6.2 10*3/uL (ref 4.0–10.5)
nRBC: 0 % (ref 0.0–0.2)

## 2020-03-19 SURGERY — SEPTOPLASTY, NOSE, WITH NASAL TURBINATE REDUCTION
Anesthesia: General | Site: Nose | Laterality: Bilateral

## 2020-03-19 MED ORDER — LIDOCAINE 2% (20 MG/ML) 5 ML SYRINGE
INTRAMUSCULAR | Status: DC | PRN
  Administered 2020-03-19: 60 mg via INTRAVENOUS

## 2020-03-19 MED ORDER — PROPOFOL 10 MG/ML IV BOLUS
INTRAVENOUS | Status: DC | PRN
  Administered 2020-03-19: 150 mg via INTRAVENOUS

## 2020-03-19 MED ORDER — LACTATED RINGERS IV SOLN: INTRAVENOUS | Status: DC

## 2020-03-19 MED ORDER — LIDOCAINE-EPINEPHRINE 2 %-1:100000 IJ SOLN
INTRAMUSCULAR | Status: AC
  Filled 2020-03-19: qty 1

## 2020-03-19 MED ORDER — ORAL CARE MOUTH RINSE: 15.0000 mL | Freq: Once | OROMUCOSAL | Status: AC

## 2020-03-19 MED ORDER — MIDAZOLAM HCL 2 MG/2ML IJ SOLN
INTRAMUSCULAR | Status: AC
  Filled 2020-03-19: qty 2

## 2020-03-19 MED ORDER — DEXAMETHASONE SODIUM PHOSPHATE 10 MG/ML IJ SOLN
INTRAMUSCULAR | Status: AC
  Filled 2020-03-19: qty 1

## 2020-03-19 MED ORDER — FENTANYL CITRATE (PF) 250 MCG/5ML IJ SOLN
INTRAMUSCULAR | Status: AC
  Filled 2020-03-19: qty 5

## 2020-03-19 MED ORDER — AMISULPRIDE (ANTIEMETIC) 5 MG/2ML IV SOLN: 10.0000 mg | Freq: Once | INTRAVENOUS | Status: DC | PRN

## 2020-03-19 MED ORDER — ONDANSETRON HCL 4 MG/2ML IJ SOLN: 4.0000 mg | Freq: Once | INTRAMUSCULAR | Status: DC | PRN

## 2020-03-19 MED ORDER — LIDOCAINE 2% (20 MG/ML) 5 ML SYRINGE
INTRAMUSCULAR | Status: AC
  Filled 2020-03-19: qty 5

## 2020-03-19 MED ORDER — BACITRACIN ZINC 500 UNIT/GM EX OINT
TOPICAL_OINTMENT | CUTANEOUS | Status: AC
  Filled 2020-03-19: qty 28.35

## 2020-03-19 MED ORDER — OXYMETAZOLINE HCL 0.05 % NA SOLN
NASAL | Status: DC | PRN
  Administered 2020-03-19: 1 via TOPICAL

## 2020-03-19 MED ORDER — DEXTROSE 5 % IV SOLN
INTRAVENOUS | Status: DC | PRN
  Administered 2020-03-19: 3 g via INTRAVENOUS

## 2020-03-19 MED ORDER — MIDAZOLAM HCL 2 MG/2ML IJ SOLN
INTRAMUSCULAR | Status: DC | PRN
  Administered 2020-03-19: 2 mg via INTRAVENOUS

## 2020-03-19 MED ORDER — SUCCINYLCHOLINE CHLORIDE 20 MG/ML IJ SOLN
INTRAMUSCULAR | Status: DC | PRN
  Administered 2020-03-19: 140 mg via INTRAVENOUS

## 2020-03-19 MED ORDER — ROCURONIUM BROMIDE 10 MG/ML (PF) SYRINGE
PREFILLED_SYRINGE | INTRAVENOUS | Status: AC
  Filled 2020-03-19: qty 10

## 2020-03-19 MED ORDER — ACETAMINOPHEN 500 MG PO TABS
1000.0000 mg | ORAL_TABLET | Freq: Once | ORAL | Status: AC
  Administered 2020-03-19: 1000 mg via ORAL
  Filled 2020-03-19: qty 2

## 2020-03-19 MED ORDER — FENTANYL CITRATE (PF) 100 MCG/2ML IJ SOLN: 25.0000 ug | INTRAMUSCULAR | Status: DC | PRN

## 2020-03-19 MED ORDER — ONDANSETRON HCL 4 MG/2ML IJ SOLN
INTRAMUSCULAR | Status: AC
  Filled 2020-03-19: qty 2

## 2020-03-19 MED ORDER — 0.9 % SODIUM CHLORIDE (POUR BTL) OPTIME
TOPICAL | Status: DC | PRN
  Administered 2020-03-19: 1000 mL

## 2020-03-19 MED ORDER — SUGAMMADEX SODIUM 200 MG/2ML IV SOLN
INTRAVENOUS | Status: DC | PRN
  Administered 2020-03-19: 300 mg via INTRAVENOUS

## 2020-03-19 MED ORDER — LIDOCAINE-EPINEPHRINE 1 %-1:100000 IJ SOLN
INTRAMUSCULAR | Status: AC
  Filled 2020-03-19: qty 1

## 2020-03-19 MED ORDER — FENTANYL CITRATE (PF) 100 MCG/2ML IJ SOLN
INTRAMUSCULAR | Status: DC | PRN
  Administered 2020-03-19 (×2): 50 ug via INTRAVENOUS

## 2020-03-19 MED ORDER — ONDANSETRON HCL 4 MG/2ML IJ SOLN
INTRAMUSCULAR | Status: DC | PRN
  Administered 2020-03-19: 4 mg via INTRAVENOUS

## 2020-03-19 MED ORDER — AMOXICILLIN 875 MG PO TABS: 875.0000 mg | ORAL_TABLET | Freq: Two times a day (BID) | ORAL | 0 refills | Status: AC

## 2020-03-19 MED ORDER — CHLORHEXIDINE GLUCONATE 0.12 % MT SOLN
15.0000 mL | Freq: Once | OROMUCOSAL | Status: AC
  Administered 2020-03-19: 15 mL via OROMUCOSAL
  Filled 2020-03-19: qty 15

## 2020-03-19 MED ORDER — PROPOFOL 10 MG/ML IV BOLUS
INTRAVENOUS | Status: AC
  Filled 2020-03-19: qty 20

## 2020-03-19 MED ORDER — OXYMETAZOLINE HCL 0.05 % NA SOLN
NASAL | Status: AC
  Filled 2020-03-19: qty 30

## 2020-03-19 MED ORDER — LIDOCAINE-EPINEPHRINE 1 %-1:100000 IJ SOLN
INTRAMUSCULAR | Status: DC | PRN
  Administered 2020-03-19: 4 mL

## 2020-03-19 MED ORDER — CEFAZOLIN SODIUM 1 G IJ SOLR
INTRAMUSCULAR | Status: AC
  Filled 2020-03-19: qty 30

## 2020-03-19 MED ORDER — ROCURONIUM BROMIDE 10 MG/ML (PF) SYRINGE
PREFILLED_SYRINGE | INTRAVENOUS | Status: DC | PRN
  Administered 2020-03-19: 50 mg via INTRAVENOUS

## 2020-03-19 MED ORDER — DEXAMETHASONE SODIUM PHOSPHATE 10 MG/ML IJ SOLN
INTRAMUSCULAR | Status: DC | PRN
  Administered 2020-03-19: 10 mg via INTRAVENOUS

## 2020-03-19 SURGICAL SUPPLY — 26 items
CANISTER SUCT 3000ML PPV (MISCELLANEOUS) ×2 IMPLANT
COAGULATOR SUCT SWTCH 10FR 6 (ELECTROSURGICAL) ×2 IMPLANT
COVER SURGICAL LIGHT HANDLE (MISCELLANEOUS) IMPLANT
COVER WAND RF STERILE (DRAPES) ×2 IMPLANT
DRAPE HALF SHEET 40X57 (DRAPES) IMPLANT
ELECT REM PT RETURN 9FT ADLT (ELECTROSURGICAL) ×2
ELECTRODE REM PT RTRN 9FT ADLT (ELECTROSURGICAL) ×1 IMPLANT
GAUZE SPONGE 2X2 8PLY STRL LF (GAUZE/BANDAGES/DRESSINGS) ×1 IMPLANT
GLOVE ECLIPSE 7.5 STRL STRAW (GLOVE) ×2 IMPLANT
GOWN STRL REUS W/ TWL LRG LVL3 (GOWN DISPOSABLE) ×2 IMPLANT
GOWN STRL REUS W/TWL LRG LVL3 (GOWN DISPOSABLE) ×4
KIT BASIN OR (CUSTOM PROCEDURE TRAY) ×2 IMPLANT
KIT TURNOVER KIT B (KITS) ×2 IMPLANT
NDL HYPO 25GX1X1/2 BEV (NEEDLE) ×1 IMPLANT
NEEDLE HYPO 25GX1X1/2 BEV (NEEDLE) ×2 IMPLANT
NS IRRIG 1000ML POUR BTL (IV SOLUTION) ×2 IMPLANT
PAD ARMBOARD 7.5X6 YLW CONV (MISCELLANEOUS) ×4 IMPLANT
SPLINT NASAL DOYLE BI-VL (GAUZE/BANDAGES/DRESSINGS) ×2 IMPLANT
SPONGE GAUZE 2X2 STER 10/PKG (GAUZE/BANDAGES/DRESSINGS) ×1
SPONGE NEURO XRAY DETECT 1X3 (DISPOSABLE) ×2 IMPLANT
SUT CHROMIC 4 0 SH 27 (SUTURE) ×2 IMPLANT
SUT PLAIN 4 0 ~~LOC~~ 1 (SUTURE) ×2 IMPLANT
SUT PROLENE 3 0 PS 2 (SUTURE) ×2 IMPLANT
TRAY ENT MC OR (CUSTOM PROCEDURE TRAY) ×2 IMPLANT
TUBE SALEM SUMP 16 FR W/ARV (TUBING) IMPLANT
TUBING EXTENTION W/L.L. (IV SETS) IMPLANT

## 2020-03-19 NOTE — H&P (Signed)
Cc: Chronic nasal obstruction  HPI: The patient is a 70 y/o male who presents today for evaluation of chronic nasal obstruction. The patient has noted difficulty breathing through his nose for most of his life. He was diagnosed with sleep apnea several years ago but was unable to tolerate the mask, mostly because of his severe nasal obstruction. The patient's sleep apnea is now having a negative impact on his heart. The patient denies nasal trauma. No facial pain or pressure is noted. The patient had a recent head CT which showed a severe septal deviation and spur with bilateral concha bullosa. The patient has tried steroid nasal sprays in the past with minimal relief.  Previous ENT surgery is denied.   The patient's review of systems (constitutional, eyes, ENT, cardiovascular, respiratory, GI, musculoskeletal, skin, neurologic, psychiatric, endocrine, hematologic, allergic) is noted in the ROS questionnaire.  It is reviewed with the patient.   Family health history: No HTN, DM, CAD, hearing loss or bleeding disorder.  Major events: Knee surgery, vasectomy, skin cancer.  Ongoing medical problems: Hypertension, asthma, reflux, arthritis, skin cancer.  Social history: The patient is married. He denies the use of tobacco, alcohol or illegal drugs.   Exam: General: Communicates without difficulty, well nourished, no acute distress. Head: Normocephalic, no evidence injury, no tenderness, facial buttresses intact without stepoff. Eyes: PERRL, EOMI. No scleral icterus, conjunctivae clear. Neuro: CN II exam reveals vision grossly intact.  No nystagmus at any point of gaze. Ears: Auricles well formed without lesions.  Ear canals are intact without mass or lesion.  No erythema or edema is appreciated.  The TMs are intact without fluid. Nose: External evaluation reveals normal support and skin without lesions.  Dorsum is intact.  Anterior rhinoscopy reveals congested and edematous mucosa over anterior aspect of  the inferior turbinates and nasal septum.  No purulence is noted. Middle meatus is not well visualized. Oral:  Oral cavity and oropharynx are intact, symmetric, without erythema or edema.  Mucosa is moist without lesions. Neck: Full range of motion without pain.  There is no significant lymphadenopathy.  No masses palpable.  Thyroid bed within normal limits to palpation.  Parotid glands and submandibular glands equal bilaterally without mass.  Trachea is midline. Neuro:  CN 2-12 grossly intact. Gait normal. Vestibular: No nystagmus at any point of gaze.   Procedure: Flexible Nasal Endoscopy Description: Risks, benefits, and alternatives of flexible endoscopy were explained to the patient. Specific mention was made of the risk of throat numbness with difficulty swallowing, possible bleeding from the nose and mouth, and pain from the procedure. The patient gave oral consent to proceed.  The flexible scope was inserted into the right nasal cavity. Severe NSD and spur. Endoscopy of the interior nasal cavity, superior, inferior, and middle meatus was performed. The sphenoid-ethmoid recess was examined. Edematous mucosa was noted. No polyp, mass, or lesion was appreciated. Olfactory cleft was clear. Nasopharynx was clear. Turbinates were hypertrophied but without mass. Incomplete response to decongestion. The procedure was repeated on the contralateral side with similar findings. The patient tolerated the procedure well.   Assessment  1. 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 This causes almost 100% obstruction. No purulent drainage, polyps, or other suspicious mass or lesion is noted on today's nasal endoscopy. 2. History of sleep apnea.   Plan  1. The physical exam, nasal endoscopy, and CT findings are reviewed with the patient.  2. The patient would benefit from septoplasty and bilateral  inferior turbinate reduction. The risks, benefits, alternatives,  and details of the procedure are reviewed with the patient. Questions are invited and answered. 3. The patient is interested in proceeding with the procedure.  We will schedule the procedure in accordance with the family schedule.

## 2020-03-19 NOTE — Anesthesia Procedure Notes (Signed)
Procedure Name: Intubation Date/Time: 03/19/2020 10:09 AM Performed by: Barrington Ellison, CRNA Pre-anesthesia Checklist: Patient identified, Emergency Drugs available, Suction available and Patient being monitored Patient Re-evaluated:Patient Re-evaluated prior to induction Oxygen Delivery Method: Circle System Utilized Preoxygenation: Pre-oxygenation with 100% oxygen Induction Type: IV induction and Rapid sequence Ventilation: Mask ventilation without difficulty Laryngoscope Size: Mac and 4 Grade View: Grade I Tube type: Oral Tube size: 7.5 mm Number of attempts: 1 Airway Equipment and Method: Stylet and Oral airway Placement Confirmation: ETT inserted through vocal cords under direct vision,  positive ETCO2 and breath sounds checked- equal and bilateral Secured at: 22 cm Tube secured with: Tape Dental Injury: Teeth and Oropharynx as per pre-operative assessment

## 2020-03-19 NOTE — Progress Notes (Signed)
Pt has been on hold for over an hour and does not require any continuous monitoring, VS will be obtained before admit to unit. Pt resting in isolation room 16 with call bell in place.

## 2020-03-19 NOTE — Op Note (Signed)
DATE OF PROCEDURE: 03/19/2020  OPERATIVE REPORT   SURGEON: Leta Baptist, MD   PREOPERATIVE DIAGNOSES:  1. Severe nasal septal deviation.  2. Bilateral inferior turbinate hypertrophy.  3. Chronic nasal obstruction.  POSTOPERATIVE DIAGNOSES:  1. Severe nasal septal deviation.  2. Bilateral inferior turbinate hypertrophy.  3. Chronic nasal obstruction.  PROCEDURE PERFORMED:  1. Septoplasty.  2. Bilateral partial inferior turbinate resection.   ANESTHESIA: General endotracheal tube anesthesia.   COMPLICATIONS: None.   ESTIMATED BLOOD LOSS: 80 mL.   INDICATION FOR PROCEDURE: Anthony Downs is a 70 y.o. male with a history of chronic nasal obstruction. The patient was treated with antihistamine, decongestant, and steroid nasal sprays. However, the patient continued to be symptomatic. On examination, the patient was noted to have bilateral severe inferior turbinate hypertrophy and significant nasal septal deviation, causing significant nasal obstruction. Based on the above findings, the decision was made for the patient to undergo the above-stated procedures. The risks, benefits, alternatives, and details of the procedures were discussed with the patient. Questions were invited and answered. Informed consent was obtained.   DESCRIPTION OF PROCEDURE: The patient was taken to the operating room and placed supine on the operating table. General endotracheal tube anesthesia was administered by the anesthesiologist. The patient was positioned, and prepped and draped in the standard fashion for nasal surgery. Pledgets soaked with Afrin were placed in both nasal cavities for decongestion. The pledgets were subsequently removed.   Examination of the nasal cavity revealed a severe nasal septal deviation. 1% lidocaine with 1:100,000 epinephrine was injected onto the nasal septum bilaterally. A hemitransfixion incision was made on the left side. The mucosal flap was carefully elevated on the left side. A  cartilaginous incision was made 1 cm superior to the caudal margin of the nasal septum. Mucosal flap was also elevated on the right side in the similar fashion. It should be noted that due to the severe septal deviation, the deviated portion of the cartilaginous and bony septum had to be removed in piecemeal fashion. Once the deviated portions were removed, a straight midline septum was achieved. The septum was then quilted with 4-0 plain gut sutures. The hemitransfixion incision was closed with interrupted 4-0 chromic sutures.   The inferior one half of both hypertrophied inferior turbinate was crossclamped with a Kelly clamp. The inferior one half of each inferior turbinate was then resected with a pair of cross cutting scissors. Hemostasis was achieved with a suction cautery device. Doyle splints were applied to the nasal septum.  The care of the patient was turned over to the anesthesiologist. The patient was awakened from anesthesia without difficulty. The patient was extubated and transferred to the recovery room in good condition.   OPERATIVE FINDINGS: Severe nasal septal deviation and bilateral inferior turbinate hypertrophy.   SPECIMEN: None.   FOLLOWUP CARE: The patient be observed overnight.  The patient will follow up in my office in 2 days for splint removal.   Su Raynelle Bring, MD

## 2020-03-19 NOTE — Anesthesia Postprocedure Evaluation (Signed)
Anesthesia Post Note  Patient: Anthony Downs  Procedure(s) Performed: NASAL SEPTOPLASTY WITH BILATERAL  TURBINATE REDUCTION (Bilateral Nose)     Patient location during evaluation: PACU Anesthesia Type: General Level of consciousness: awake Pain management: pain level controlled Vital Signs Assessment: post-procedure vital signs reviewed and stable Respiratory status: spontaneous breathing, nonlabored ventilation, respiratory function stable and patient connected to nasal cannula oxygen Cardiovascular status: blood pressure returned to baseline and stable Postop Assessment: no apparent nausea or vomiting Anesthetic complications: no   No complications documented.  Last Vitals:  Vitals:   03/19/20 1212 03/19/20 1242  BP: (!) 158/95 (!) 159/89  Pulse: 67 78  Resp: 11 14  Temp:    SpO2: 100% 100%    Last Pain:  Vitals:   03/19/20 1212  TempSrc:   PainSc: Asleep                 Neddie Steedman P Tria Noguera

## 2020-03-19 NOTE — Progress Notes (Signed)
Pt has been in PACU since this AM, pt's VS stable, no excessive bleeding or pain noted. Pt stated he feels like he is able to take care of himself with his wife at home. This RN called Benjamine Mola and he agreed that the pt could be DC'd from PACU.

## 2020-03-19 NOTE — Transfer of Care (Signed)
Immediate Anesthesia Transfer of Care Note  Patient: Anthony Downs  Procedure(s) Performed: NASAL SEPTOPLASTY WITH BILATERAL  TURBINATE REDUCTION (Bilateral Nose)  Patient Location: PACU  Anesthesia Type:General  Level of Consciousness: awake and oriented  Airway & Oxygen Therapy: Patient Spontanous Breathing  Post-op Assessment: Report given to RN  Post vital signs: Reviewed and stable  Last Vitals:  Vitals Value Taken Time  BP    Temp    Pulse 92 03/19/20 1112  Resp 15 03/19/20 1112  SpO2 91 % 03/19/20 1112  Vitals shown include unvalidated device data.  Last Pain:  Vitals:   03/19/20 0833  TempSrc:   PainSc: 0-No pain         Complications: No complications documented.

## 2020-03-19 NOTE — Anesthesia Preprocedure Evaluation (Addendum)
Anesthesia Evaluation  Patient identified by MRN, date of birth, ID band Patient awake    Reviewed: Allergy & Precautions, NPO status , Patient's Chart, lab work & pertinent test results  Airway Mallampati: II  TM Distance: >3 FB Neck ROM: Full    Dental no notable dental hx.    Pulmonary asthma , sleep apnea ,    Pulmonary exam normal breath sounds clear to auscultation       Cardiovascular hypertension, Pt. on medications Normal cardiovascular exam Rhythm:Regular Rate:Normal  ECG: NSR, rate 61   Neuro/Psych Subdural hematoma, post-traumatic negative neurological ROS  negative psych ROS   GI/Hepatic Neg liver ROS, GERD  Medicated and Controlled,  Endo/Other  Morbid obesity  Renal/GU negative Renal ROS     Musculoskeletal  (+) Arthritis ,   Abdominal   Peds  Hematology HLD   Anesthesia Other Findings DEVIATED SEPTUM BILATERAL TURBINATE HYPERTROPHY  Reproductive/Obstetrics                            Anesthesia Physical Anesthesia Plan  ASA: III  Anesthesia Plan: General   Post-op Pain Management:    Induction: Intravenous  PONV Risk Score and Plan: 3 and Ondansetron, Dexamethasone, Midazolam and Treatment may vary due to age or medical condition  Airway Management Planned: Oral ETT  Additional Equipment:   Intra-op Plan:   Post-operative Plan: Extubation in OR  Informed Consent: I have reviewed the patients History and Physical, chart, labs and discussed the procedure including the risks, benefits and alternatives for the proposed anesthesia with the patient or authorized representative who has indicated his/her understanding and acceptance.     Dental advisory given  Plan Discussed with: CRNA  Anesthesia Plan Comments:        Anesthesia Quick Evaluation

## 2020-03-20 ENCOUNTER — Encounter (HOSPITAL_COMMUNITY): Payer: Self-pay | Admitting: Otolaryngology

## 2020-04-28 ENCOUNTER — Inpatient Hospital Stay (HOSPITAL_COMMUNITY)
Admission: EM | Admit: 2020-04-28 | Discharge: 2020-05-02 | DRG: 025 | Disposition: A | Discharge status: Rehabilitation Facility | Payer: Medicare Other | Attending: Neurological Surgery | Admitting: Neurological Surgery

## 2020-04-28 ENCOUNTER — Other Ambulatory Visit: Payer: Self-pay

## 2020-04-28 ENCOUNTER — Inpatient Hospital Stay (HOSPITAL_COMMUNITY): Payer: Medicare Other

## 2020-04-28 DIAGNOSIS — G8250 Quadriplegia, unspecified: Secondary | ICD-10-CM | POA: Diagnosis present

## 2020-04-28 DIAGNOSIS — I82442 Acute embolism and thrombosis of left tibial vein: Secondary | ICD-10-CM | POA: Diagnosis not present

## 2020-04-28 DIAGNOSIS — N390 Urinary tract infection, site not specified: Secondary | ICD-10-CM | POA: Diagnosis not present

## 2020-04-28 DIAGNOSIS — K219 Gastro-esophageal reflux disease without esophagitis: Secondary | ICD-10-CM | POA: Diagnosis present

## 2020-04-28 DIAGNOSIS — Z87820 Personal history of traumatic brain injury: Secondary | ICD-10-CM

## 2020-04-28 DIAGNOSIS — D496 Neoplasm of unspecified behavior of brain: Secondary | ICD-10-CM | POA: Diagnosis present

## 2020-04-28 DIAGNOSIS — G936 Cerebral edema: Secondary | ICD-10-CM | POA: Diagnosis present

## 2020-04-28 DIAGNOSIS — N401 Enlarged prostate with lower urinary tract symptoms: Secondary | ICD-10-CM | POA: Diagnosis present

## 2020-04-28 DIAGNOSIS — Z72820 Sleep deprivation: Secondary | ICD-10-CM | POA: Diagnosis not present

## 2020-04-28 DIAGNOSIS — B961 Klebsiella pneumoniae [K. pneumoniae] as the cause of diseases classified elsewhere: Secondary | ICD-10-CM | POA: Diagnosis not present

## 2020-04-28 DIAGNOSIS — G935 Compression of brain: Secondary | ICD-10-CM | POA: Diagnosis present

## 2020-04-28 DIAGNOSIS — D329 Benign neoplasm of meninges, unspecified: Principal | ICD-10-CM | POA: Diagnosis present

## 2020-04-28 DIAGNOSIS — L299 Pruritus, unspecified: Secondary | ICD-10-CM | POA: Diagnosis present

## 2020-04-28 DIAGNOSIS — I10 Essential (primary) hypertension: Secondary | ICD-10-CM | POA: Diagnosis present

## 2020-04-28 DIAGNOSIS — Z6841 Body Mass Index (BMI) 40.0 and over, adult: Secondary | ICD-10-CM

## 2020-04-28 DIAGNOSIS — Z20822 Contact with and (suspected) exposure to covid-19: Secondary | ICD-10-CM | POA: Diagnosis present

## 2020-04-28 DIAGNOSIS — M25512 Pain in left shoulder: Secondary | ICD-10-CM | POA: Diagnosis not present

## 2020-04-28 DIAGNOSIS — R451 Restlessness and agitation: Secondary | ICD-10-CM | POA: Diagnosis not present

## 2020-04-28 DIAGNOSIS — R4701 Aphasia: Secondary | ICD-10-CM | POA: Diagnosis not present

## 2020-04-28 DIAGNOSIS — Z85828 Personal history of other malignant neoplasm of skin: Secondary | ICD-10-CM | POA: Diagnosis not present

## 2020-04-28 DIAGNOSIS — K5901 Slow transit constipation: Secondary | ICD-10-CM | POA: Diagnosis not present

## 2020-04-28 DIAGNOSIS — R5383 Other fatigue: Secondary | ICD-10-CM | POA: Diagnosis not present

## 2020-04-28 DIAGNOSIS — G8191 Hemiplegia, unspecified affecting right dominant side: Secondary | ICD-10-CM | POA: Diagnosis present

## 2020-04-28 DIAGNOSIS — R482 Apraxia: Secondary | ICD-10-CM | POA: Diagnosis present

## 2020-04-28 DIAGNOSIS — D72829 Elevated white blood cell count, unspecified: Secondary | ICD-10-CM | POA: Diagnosis present

## 2020-04-28 DIAGNOSIS — M7989 Other specified soft tissue disorders: Secondary | ICD-10-CM | POA: Diagnosis not present

## 2020-04-28 DIAGNOSIS — R41841 Cognitive communication deficit: Secondary | ICD-10-CM | POA: Diagnosis present

## 2020-04-28 DIAGNOSIS — M25511 Pain in right shoulder: Secondary | ICD-10-CM | POA: Diagnosis not present

## 2020-04-28 DIAGNOSIS — R251 Tremor, unspecified: Secondary | ICD-10-CM | POA: Diagnosis present

## 2020-04-28 DIAGNOSIS — R410 Disorientation, unspecified: Secondary | ICD-10-CM | POA: Diagnosis not present

## 2020-04-28 DIAGNOSIS — E785 Hyperlipidemia, unspecified: Secondary | ICD-10-CM | POA: Diagnosis present

## 2020-04-28 DIAGNOSIS — R3915 Urgency of urination: Secondary | ICD-10-CM | POA: Diagnosis present

## 2020-04-28 DIAGNOSIS — Z79899 Other long term (current) drug therapy: Secondary | ICD-10-CM

## 2020-04-28 DIAGNOSIS — R350 Frequency of micturition: Secondary | ICD-10-CM | POA: Diagnosis not present

## 2020-04-28 DIAGNOSIS — E876 Hypokalemia: Secondary | ICD-10-CM | POA: Diagnosis not present

## 2020-04-28 DIAGNOSIS — G8918 Other acute postprocedural pain: Secondary | ICD-10-CM | POA: Diagnosis not present

## 2020-04-28 HISTORY — DX: Nausea with vomiting, unspecified: Z98.890

## 2020-04-28 HISTORY — DX: Nausea with vomiting, unspecified: R11.2

## 2020-04-28 LAB — CBC WITH DIFFERENTIAL/PLATELET
Abs Immature Granulocytes: 0.03 10*3/uL (ref 0.00–0.07)
Basophils Absolute: 0 10*3/uL (ref 0.0–0.1)
Basophils Relative: 1 %
Eosinophils Absolute: 0.2 10*3/uL (ref 0.0–0.5)
Eosinophils Relative: 3 %
HCT: 42.8 % (ref 39.0–52.0)
Hemoglobin: 13.9 g/dL (ref 13.0–17.0)
Immature Granulocytes: 1 %
Lymphocytes Relative: 35 %
Lymphs Abs: 2.2 10*3/uL (ref 0.7–4.0)
MCH: 30.9 pg (ref 26.0–34.0)
MCHC: 32.5 g/dL (ref 30.0–36.0)
MCV: 95.1 fL (ref 80.0–100.0)
Monocytes Absolute: 0.5 10*3/uL (ref 0.1–1.0)
Monocytes Relative: 8 %
Neutro Abs: 3.3 10*3/uL (ref 1.7–7.7)
Neutrophils Relative %: 52 %
Platelets: 185 10*3/uL (ref 150–400)
RBC: 4.5 MIL/uL (ref 4.22–5.81)
RDW: 12.5 % (ref 11.5–15.5)
WBC: 6.3 10*3/uL (ref 4.0–10.5)
nRBC: 0 % (ref 0.0–0.2)

## 2020-04-28 LAB — BASIC METABOLIC PANEL
Anion gap: 6 (ref 5–15)
BUN: 10 mg/dL (ref 8–23)
CO2: 27 mmol/L (ref 22–32)
Calcium: 9.1 mg/dL (ref 8.9–10.3)
Chloride: 107 mmol/L (ref 98–111)
Creatinine, Ser: 1.06 mg/dL (ref 0.61–1.24)
GFR, Estimated: >60 mL/min (ref >60)
Glucose, Bld: 123 mg/dL — ABNORMAL HIGH (ref 70–99)
Potassium: 3.7 mmol/L (ref 3.5–5.1)
Sodium: 140 mmol/L (ref 135–145)

## 2020-04-28 LAB — SARS CORONAVIRUS 2 (TAT 6-24 HRS): SARS Coronavirus 2: NEGATIVE

## 2020-04-28 MED ORDER — DEXAMETHASONE SODIUM PHOSPHATE 4 MG/ML IJ SOLN
4.0000 mg | Freq: Two times a day (BID) | INTRAMUSCULAR | Status: DC
  Administered 2020-04-28 – 2020-05-02 (×7): 4 mg via INTRAVENOUS
  Filled 2020-04-28 (×7): qty 1

## 2020-04-28 MED ORDER — IOHEXOL 350 MG/ML SOLN
75.0000 mL | Freq: Once | INTRAVENOUS | Status: AC | PRN
  Administered 2020-04-28: 75 mL via INTRAVENOUS

## 2020-04-28 MED ORDER — HYDROCODONE-ACETAMINOPHEN 5-325 MG PO TABS
1.0000 | ORAL_TABLET | ORAL | Status: DC | PRN
  Administered 2020-04-30 – 2020-05-02 (×5): 1 via ORAL
  Filled 2020-04-28 (×5): qty 1

## 2020-04-28 MED ORDER — POLYVINYL ALCOHOL 1.4 % OP SOLN
1.0000 [drp] | Freq: Every day | OPHTHALMIC | Status: DC | PRN
  Filled 2020-04-28: qty 15

## 2020-04-28 MED ORDER — AMLODIPINE BESYLATE 10 MG PO TABS
10.0000 mg | ORAL_TABLET | Freq: Every day | ORAL | Status: DC
  Administered 2020-04-30: 10 mg via ORAL
  Filled 2020-04-28 (×3): qty 1

## 2020-04-28 MED ORDER — POLYETHYLENE GLYCOL 3350 17 G PO PACK: 17.0000 g | PACK | Freq: Every day | ORAL | Status: DC | PRN

## 2020-04-28 MED ORDER — ONDANSETRON HCL 4 MG/2ML IJ SOLN: 4.0000 mg | INTRAMUSCULAR | Status: DC | PRN

## 2020-04-28 MED ORDER — PROMETHAZINE HCL 25 MG PO TABS
12.5000 mg | ORAL_TABLET | ORAL | Status: DC | PRN
  Filled 2020-04-28: qty 1

## 2020-04-28 MED ORDER — DOCUSATE SODIUM 100 MG PO CAPS
100.0000 mg | ORAL_CAPSULE | Freq: Two times a day (BID) | ORAL | Status: DC
  Administered 2020-04-28 – 2020-05-02 (×6): 100 mg via ORAL
  Filled 2020-04-28 (×6): qty 1

## 2020-04-28 MED ORDER — LORATADINE 10 MG PO TABS
10.0000 mg | ORAL_TABLET | Freq: Every day | ORAL | Status: DC
  Administered 2020-04-30 – 2020-05-02 (×3): 10 mg via ORAL
  Filled 2020-04-28 (×3): qty 1

## 2020-04-28 MED ORDER — ACETAMINOPHEN 325 MG PO TABS
650.0000 mg | ORAL_TABLET | ORAL | Status: DC | PRN
  Administered 2020-04-30 – 2020-05-01 (×5): 650 mg via ORAL
  Filled 2020-04-28 (×5): qty 2

## 2020-04-28 MED ORDER — SODIUM CHLORIDE 0.9 % IV SOLN: INTRAVENOUS | Status: DC

## 2020-04-28 MED ORDER — HYDROMORPHONE HCL 1 MG/ML IJ SOLN
0.5000 mg | INTRAMUSCULAR | Status: DC | PRN
  Administered 2020-04-30: 0.5 mg via INTRAVENOUS
  Filled 2020-04-28: qty 1

## 2020-04-28 MED ORDER — ONDANSETRON HCL 4 MG PO TABS
4.0000 mg | ORAL_TABLET | ORAL | Status: DC | PRN
  Administered 2020-04-30 – 2020-05-01 (×3): 4 mg via ORAL
  Filled 2020-04-28 (×3): qty 1

## 2020-04-28 MED ORDER — LABETALOL HCL 5 MG/ML IV SOLN: 10.0000 mg | INTRAVENOUS | Status: DC | PRN

## 2020-04-28 MED ORDER — ACETAMINOPHEN 650 MG RE SUPP: 650.0000 mg | RECTAL | Status: DC | PRN

## 2020-04-28 MED ORDER — PANTOPRAZOLE SODIUM 40 MG PO TBEC
40.0000 mg | DELAYED_RELEASE_TABLET | Freq: Every day | ORAL | Status: DC
  Administered 2020-04-30 – 2020-05-02 (×3): 40 mg via ORAL
  Filled 2020-04-28 (×3): qty 1

## 2020-04-28 MED ORDER — FUROSEMIDE 20 MG PO TABS
20.0000 mg | ORAL_TABLET | Freq: Every day | ORAL | Status: DC
  Administered 2020-04-30 – 2020-05-02 (×3): 20 mg via ORAL
  Filled 2020-04-28 (×3): qty 1

## 2020-04-28 MED ORDER — ATORVASTATIN CALCIUM 10 MG PO TABS
10.0000 mg | ORAL_TABLET | Freq: Every day | ORAL | Status: DC
Start: 2020-04-28 — End: 2020-05-02
  Administered 2020-04-28 – 2020-05-01 (×3): 10 mg via ORAL
  Filled 2020-04-28 (×3): qty 1
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
## 2020-04-28 NOTE — ED Triage Notes (Signed)
Emergency Medicine Provider Triage Evaluation Note  Anthony Downs , a 70 y.o. male  was evaluated in triage.  Pt complains of right extremity weakness.  Began 3 weeks ago.  Seen at outside facility, MRI performed which showed brain mass.  Patient sent here for neurology consultation.  Patient denies any anticoagulation use.  No chest pain, shortness of breath.  He states he does not feel weak however he keeps dragging his right leg and notices when he goes to pick up things he drops them.  No paresthesias  Review of Systems  Positive: Right upper and lower extremity weakness. Negative: Facial droop, difficulty speaking, falls, injuries, chest pain, shortness of breath.  Physical Exam  BP (!) 157/83 (BP Location: Left Arm)   Pulse 79   Temp (!) 97.4 F (36.3 C) (Oral)   Resp 18   Ht 6' (1.829 m)   Wt (!) 137.9 kg   SpO2 94%   BMI 41.23 kg/m  Gen:   Awake, no distress   HEENT:  Atraumatic  Resp:  Normal effort  Cardiac:  Normal rate  Abd:   Nondistended, nontender  MSK:   Moves extremities without difficulty Neuro:  Speech clear, no facial droop, intact sensation Weakness to right upper and lower extremities  Medical Decision Making  Medically screening exam initiated at 12:20 PM.  Appropriate orders placed.  Beverely Pace Senner was informed that the remainder of the evaluation will be completed by another provider, this initial triage assessment does not replace that evaluation, and the importance of remaining in the ED until their evaluation is complete.  Clinical Impression  Right extremity weakness   Alvaro Aungst A, PA-C 04/28/20 1224

## 2020-04-28 NOTE — ED Triage Notes (Signed)
Reported was sent by outpatient facility d/t +mass on MRI, concerning for tumor. Patient stated he's been having right sided weakness for 3 weeks.
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
## 2020-04-29 ENCOUNTER — Inpatient Hospital Stay (HOSPITAL_COMMUNITY): Payer: Medicare Other | Admitting: Anesthesiology

## 2020-04-29 ENCOUNTER — Encounter (HOSPITAL_COMMUNITY): Payer: Self-pay | Admitting: Neurological Surgery

## 2020-04-29 ENCOUNTER — Encounter (HOSPITAL_COMMUNITY)
Admission: EM | Disposition: A | Discharge status: Rehabilitation Facility | Payer: Self-pay | Source: Home / Self Care | Attending: Neurological Surgery

## 2020-04-29 ENCOUNTER — Inpatient Hospital Stay (HOSPITAL_COMMUNITY): Payer: Medicare Other

## 2020-04-29 HISTORY — PX: APPLICATION OF CRANIAL NAVIGATION: SHX6578

## 2020-04-29 HISTORY — PX: CRANIOTOMY: SHX93

## 2020-04-29 LAB — POCT I-STAT 7, (LYTES, BLD GAS, ICA,H+H)
Acid-base deficit: 3 mmol/L — ABNORMAL HIGH (ref 0.0–2.0)
Bicarbonate: 21.7 mmol/L (ref 20.0–28.0)
Calcium, Ion: 1.21 mmol/L (ref 1.15–1.40)
HCT: 35 % — ABNORMAL LOW (ref 39.0–52.0)
Hemoglobin: 11.9 g/dL — ABNORMAL LOW (ref 13.0–17.0)
O2 Saturation: 98 %
Patient temperature: 37
Potassium: 4.2 mmol/L (ref 3.5–5.1)
Sodium: 141 mmol/L (ref 135–145)
TCO2: 23 mmol/L (ref 22–32)
pCO2 arterial: 37.5 mmHg (ref 32.0–48.0)
pH, Arterial: 7.37 (ref 7.350–7.450)
pO2, Arterial: 107 mmHg (ref 83.0–108.0)

## 2020-04-29 LAB — GLUCOSE, CAPILLARY: Glucose-Capillary: 193 mg/dL — ABNORMAL HIGH (ref 70–99)

## 2020-04-29 LAB — PREPARE RBC (CROSSMATCH)

## 2020-04-29 LAB — SURGICAL PCR SCREEN
MRSA, PCR: NEGATIVE
Staphylococcus aureus: NEGATIVE

## 2020-04-29 LAB — ABO/RH: ABO/RH(D): O POS

## 2020-04-29 SURGERY — CRANIOTOMY TUMOR EXCISION
Anesthesia: General | Site: Head | Laterality: Left

## 2020-04-29 MED ORDER — CEFAZOLIN SODIUM 1 G IJ SOLR
INTRAMUSCULAR | Status: AC
  Filled 2020-04-29: qty 30

## 2020-04-29 MED ORDER — DEXTROSE 5 % IV SOLN
INTRAVENOUS | Status: DC | PRN
  Administered 2020-04-29: 3 g via INTRAVENOUS

## 2020-04-29 MED ORDER — LACTATED RINGERS IV SOLN: INTRAVENOUS | Status: DC

## 2020-04-29 MED ORDER — THROMBIN 20000 UNITS EX SOLR: CUTANEOUS | Status: DC | PRN

## 2020-04-29 MED ORDER — CHLORHEXIDINE GLUCONATE 0.12 % MT SOLN: 15.0000 mL | Freq: Once | OROMUCOSAL | Status: AC

## 2020-04-29 MED ORDER — ACETAMINOPHEN 10 MG/ML IV SOLN: 1000.0000 mg | Freq: Once | INTRAVENOUS | Status: DC | PRN

## 2020-04-29 MED ORDER — THROMBIN 20000 UNITS EX SOLR
CUTANEOUS | Status: AC
  Filled 2020-04-29: qty 20000

## 2020-04-29 MED ORDER — CHLORHEXIDINE GLUCONATE 0.12 % MT SOLN
OROMUCOSAL | Status: AC
  Administered 2020-04-29: 15 mL via OROMUCOSAL
  Filled 2020-04-29: qty 15

## 2020-04-29 MED ORDER — EPHEDRINE 5 MG/ML INJ
INTRAVENOUS | Status: AC
  Filled 2020-04-29: qty 10

## 2020-04-29 MED ORDER — SUGAMMADEX SODIUM 200 MG/2ML IV SOLN
INTRAVENOUS | Status: DC | PRN
  Administered 2020-04-29: 300 mg via INTRAVENOUS

## 2020-04-29 MED ORDER — FENTANYL CITRATE (PF) 250 MCG/5ML IJ SOLN
INTRAMUSCULAR | Status: AC
  Filled 2020-04-29: qty 5

## 2020-04-29 MED ORDER — THROMBIN 5000 UNITS EX SOLR: OROMUCOSAL | Status: DC | PRN

## 2020-04-29 MED ORDER — ONDANSETRON HCL 4 MG/2ML IJ SOLN
INTRAMUSCULAR | Status: DC | PRN
  Administered 2020-04-29: 4 mg via INTRAVENOUS

## 2020-04-29 MED ORDER — ROCURONIUM BROMIDE 10 MG/ML (PF) SYRINGE
PREFILLED_SYRINGE | INTRAVENOUS | Status: DC | PRN
  Administered 2020-04-29: 10 mg via INTRAVENOUS
  Administered 2020-04-29: 50 mg via INTRAVENOUS
  Administered 2020-04-29: 20 mg via INTRAVENOUS
  Administered 2020-04-29: 50 mg via INTRAVENOUS
  Administered 2020-04-29: 40 mg via INTRAVENOUS
  Administered 2020-04-29: 60 mg via INTRAVENOUS

## 2020-04-29 MED ORDER — LIDOCAINE 2% (20 MG/ML) 5 ML SYRINGE
INTRAMUSCULAR | Status: DC | PRN
  Administered 2020-04-29: 100 mg via INTRAVENOUS

## 2020-04-29 MED ORDER — DEXAMETHASONE SODIUM PHOSPHATE 10 MG/ML IJ SOLN
INTRAMUSCULAR | Status: DC | PRN
  Administered 2020-04-29: 5 mg via INTRAVENOUS

## 2020-04-29 MED ORDER — PROPOFOL 10 MG/ML IV BOLUS
INTRAVENOUS | Status: AC
  Filled 2020-04-29: qty 40

## 2020-04-29 MED ORDER — CHLORHEXIDINE GLUCONATE CLOTH 2 % EX PADS
6.0000 | MEDICATED_PAD | Freq: Every day | CUTANEOUS | Status: DC
  Administered 2020-04-29 – 2020-04-30 (×2): 6 via TOPICAL

## 2020-04-29 MED ORDER — DEXAMETHASONE SODIUM PHOSPHATE 10 MG/ML IJ SOLN
INTRAMUSCULAR | Status: AC
  Filled 2020-04-29: qty 1

## 2020-04-29 MED ORDER — ACETAMINOPHEN 10 MG/ML IV SOLN
INTRAVENOUS | Status: DC | PRN
  Administered 2020-04-29: 1000 mg via INTRAVENOUS

## 2020-04-29 MED ORDER — SUFENTANIL CITRATE 250 MCG/5ML IV SOLN
0.2500 ug/kg/h | INTRAVENOUS | Status: DC
  Filled 2020-04-29 (×2): qty 5

## 2020-04-29 MED ORDER — LIDOCAINE 2% (20 MG/ML) 5 ML SYRINGE
INTRAMUSCULAR | Status: AC
  Filled 2020-04-29: qty 5

## 2020-04-29 MED ORDER — ACETAMINOPHEN 10 MG/ML IV SOLN
INTRAVENOUS | Status: AC
  Filled 2020-04-29: qty 100

## 2020-04-29 MED ORDER — BACITRACIN ZINC 500 UNIT/GM EX OINT
TOPICAL_OINTMENT | CUTANEOUS | Status: AC
  Filled 2020-04-29: qty 28.35

## 2020-04-29 MED ORDER — FENTANYL CITRATE (PF) 250 MCG/5ML IJ SOLN
INTRAMUSCULAR | Status: DC | PRN
  Administered 2020-04-29 (×2): 50 ug via INTRAVENOUS
  Administered 2020-04-29: 150 ug via INTRAVENOUS

## 2020-04-29 MED ORDER — APREPITANT 40 MG PO CAPS
40.0000 mg | ORAL_CAPSULE | Freq: Once | ORAL | Status: AC
  Administered 2020-04-29: 40 mg via ORAL
  Filled 2020-04-29: qty 1

## 2020-04-29 MED ORDER — FENTANYL CITRATE (PF) 100 MCG/2ML IJ SOLN: 25.0000 ug | INTRAMUSCULAR | Status: DC | PRN

## 2020-04-29 MED ORDER — THROMBIN 5000 UNITS EX SOLR
CUTANEOUS | Status: AC
  Filled 2020-04-29: qty 5000

## 2020-04-29 MED ORDER — BACITRACIN ZINC 500 UNIT/GM EX OINT
TOPICAL_OINTMENT | CUTANEOUS | Status: DC | PRN
  Administered 2020-04-29: 1 via TOPICAL

## 2020-04-29 MED ORDER — SODIUM CHLORIDE 0.9 % IV SOLN
0.0500 ug/kg/min | INTRAVENOUS | Status: DC
  Administered 2020-04-29: .1 ug/kg/min via INTRAVENOUS
  Filled 2020-04-29 (×4): qty 5000

## 2020-04-29 MED ORDER — PHENYLEPHRINE 40 MCG/ML (10ML) SYRINGE FOR IV PUSH (FOR BLOOD PRESSURE SUPPORT): PREFILLED_SYRINGE | INTRAVENOUS | Status: DC | PRN

## 2020-04-29 MED ORDER — MIDAZOLAM HCL 2 MG/2ML IJ SOLN: INTRAMUSCULAR | Status: DC | PRN

## 2020-04-29 MED ORDER — ONDANSETRON HCL 4 MG/2ML IJ SOLN
INTRAMUSCULAR | Status: AC
  Filled 2020-04-29: qty 2

## 2020-04-29 MED ORDER — 0.9 % SODIUM CHLORIDE (POUR BTL) OPTIME
TOPICAL | Status: DC | PRN
  Administered 2020-04-29: 3000 mL

## 2020-04-29 MED ORDER — PHENYLEPHRINE HCL-NACL 10-0.9 MG/250ML-% IV SOLN
INTRAVENOUS | Status: DC | PRN
  Administered 2020-04-29: 30 ug/min via INTRAVENOUS

## 2020-04-29 MED ORDER — LABETALOL HCL 5 MG/ML IV SOLN
INTRAVENOUS | Status: AC
  Filled 2020-04-29: qty 4

## 2020-04-29 MED ORDER — PROPOFOL 10 MG/ML IV BOLUS
INTRAVENOUS | Status: DC | PRN
  Administered 2020-04-29: 30 mg via INTRAVENOUS
  Administered 2020-04-29: 200 mg via INTRAVENOUS

## 2020-04-29 MED ORDER — PHENYLEPHRINE 40 MCG/ML (10ML) SYRINGE FOR IV PUSH (FOR BLOOD PRESSURE SUPPORT)
PREFILLED_SYRINGE | INTRAVENOUS | Status: DC | PRN
  Administered 2020-04-29: 160 ug via INTRAVENOUS
  Administered 2020-04-29: 120 ug via INTRAVENOUS

## 2020-04-29 MED ORDER — SODIUM CHLORIDE 0.9 % IV SOLN: INTRAVENOUS | Status: DC

## 2020-04-29 MED ORDER — ORAL CARE MOUTH RINSE
15.0000 mL | Freq: Once | OROMUCOSAL | Status: AC
Start: 2020-04-29 — End: 2020-04-29

## 2020-04-29 MED ORDER — PHENYLEPHRINE 40 MCG/ML (10ML) SYRINGE FOR IV PUSH (FOR BLOOD PRESSURE SUPPORT)
PREFILLED_SYRINGE | INTRAVENOUS | Status: AC
  Filled 2020-04-29: qty 10

## 2020-04-29 MED ORDER — ALBUMIN HUMAN 5 % IV SOLN: INTRAVENOUS | Status: DC | PRN

## 2020-04-29 MED ORDER — HEMOSTATIC AGENTS (NO CHARGE) OPTIME
TOPICAL | Status: DC | PRN
  Administered 2020-04-29: 1 via TOPICAL

## 2020-04-29 MED ORDER — LIDOCAINE-EPINEPHRINE 1 %-1:100000 IJ SOLN
INTRAMUSCULAR | Status: AC
  Filled 2020-04-29: qty 1

## 2020-04-29 MED ORDER — ONDANSETRON HCL 4 MG/2ML IJ SOLN: 4.0000 mg | Freq: Once | INTRAMUSCULAR | Status: DC | PRN

## 2020-04-29 MED ORDER — ROCURONIUM BROMIDE 10 MG/ML (PF) SYRINGE
PREFILLED_SYRINGE | INTRAVENOUS | Status: AC
  Filled 2020-04-29: qty 20

## 2020-04-29 MED ORDER — EPHEDRINE SULFATE-NACL 50-0.9 MG/10ML-% IV SOSY
PREFILLED_SYRINGE | INTRAVENOUS | Status: DC | PRN
  Administered 2020-04-29 (×2): 10 mg via INTRAVENOUS
  Administered 2020-04-29: 5 mg via INTRAVENOUS
  Administered 2020-04-29: 15 mg via INTRAVENOUS
  Administered 2020-04-29: 10 mg via INTRAVENOUS

## 2020-04-29 MED ORDER — LIDOCAINE-EPINEPHRINE 1 %-1:100000 IJ SOLN
INTRAMUSCULAR | Status: DC | PRN
  Administered 2020-04-29: 7 mL

## 2020-04-29 MED ORDER — LABETALOL HCL 5 MG/ML IV SOLN
10.0000 mg | Freq: Once | INTRAVENOUS | Status: AC
  Administered 2020-04-29: 10 mg via INTRAVENOUS

## 2020-04-29 SURGICAL SUPPLY — 79 items
BAND INSRT 18 STRL LF DISP RB (MISCELLANEOUS)
BAND RUBBER #18 3X1/16 STRL (MISCELLANEOUS) IMPLANT
BLADE CLIPPER SURG (BLADE) ×2 IMPLANT
BUR ACORN 9.0 PRECISION (BURR) ×2 IMPLANT
BUR ROUND FLUTED 4 SOFT TCH (BURR) IMPLANT
BUR SPIRAL ROUTER 2.3 (BUR) ×2 IMPLANT
CANISTER SUCT 3000ML PPV (MISCELLANEOUS) ×4 IMPLANT
CNTNR URN SCR LID CUP LEK RST (MISCELLANEOUS) ×1 IMPLANT
CONT SPEC 4OZ STRL OR WHT (MISCELLANEOUS) ×2
DECANTER SPIKE VIAL GLASS SM (MISCELLANEOUS) ×2 IMPLANT
DRAPE HALF SHEET 40X57 (DRAPES) ×2 IMPLANT
DRAPE MICROSCOPE LEICA (MISCELLANEOUS) ×1 IMPLANT
DRAPE NEUROLOGICAL W/INCISE (DRAPES) ×2 IMPLANT
DRAPE STERI IOBAN 125X83 (DRAPES) IMPLANT
DRAPE WARM FLUID 44X44 (DRAPES) ×2 IMPLANT
DRSG ADAPTIC 3X8 NADH LF (GAUZE/BANDAGES/DRESSINGS) IMPLANT
DRSG TELFA 3X8 NADH (GAUZE/BANDAGES/DRESSINGS) IMPLANT
DURAPREP 6ML APPLICATOR 50/CS (WOUND CARE) ×2 IMPLANT
ELECT REM PT RETURN 9FT ADLT (ELECTROSURGICAL) ×2
ELECTRODE REM PT RTRN 9FT ADLT (ELECTROSURGICAL) ×1 IMPLANT
EVACUATOR 1/8 PVC DRAIN (DRAIN) IMPLANT
EVACUATOR SILICONE 100CC (DRAIN) IMPLANT
FORCEPS BIPOLAR SPETZLER 8 1.0 (NEUROSURGERY SUPPLIES) ×2 IMPLANT
GAUZE 4X4 16PLY RFD (DISPOSABLE) IMPLANT
GAUZE SPONGE 4X4 12PLY STRL (GAUZE/BANDAGES/DRESSINGS) IMPLANT
GLOVE BIO SURGEON STRL SZ7 (GLOVE) IMPLANT
GLOVE BIOGEL PI IND STRL 7.5 (GLOVE) ×1 IMPLANT
GLOVE BIOGEL PI INDICATOR 7.5 (GLOVE) ×1
GLOVE ECLIPSE 7.5 STRL STRAW (GLOVE) ×2 IMPLANT
GLOVE EXAM NITRILE LRG STRL (GLOVE) IMPLANT
GLOVE SURG UNDER POLY LF SZ7 (GLOVE) IMPLANT
GOWN STRL REUS W/ TWL LRG LVL3 (GOWN DISPOSABLE) ×2 IMPLANT
GOWN STRL REUS W/ TWL XL LVL3 (GOWN DISPOSABLE) IMPLANT
GOWN STRL REUS W/TWL 2XL LVL3 (GOWN DISPOSABLE) IMPLANT
GOWN STRL REUS W/TWL LRG LVL3 (GOWN DISPOSABLE) ×6
GOWN STRL REUS W/TWL XL LVL3 (GOWN DISPOSABLE)
GRAFT DURAGEN MATRIX 5WX7L (Graft) ×1 IMPLANT
HEMOSTAT POWDER KIT SURGIFOAM (HEMOSTASIS) ×2 IMPLANT
HEMOSTAT SURGICEL 2X14 (HEMOSTASIS) ×2 IMPLANT
IV NS 1000ML (IV SOLUTION) ×2
IV NS 1000ML BAXH (IV SOLUTION) ×1 IMPLANT
KIT BASIN OR (CUSTOM PROCEDURE TRAY) ×2 IMPLANT
KIT DRAIN CSF ACCUDRAIN (MISCELLANEOUS) IMPLANT
KIT TURNOVER KIT B (KITS) ×2 IMPLANT
MARKER SPHERE PSV REFLC 13MM (MARKER) ×6 IMPLANT
MESH DYNAMIC MALL LG 1.7X120 (Mesh General) ×1 IMPLANT
NDL SPNL 18GX3.5 QUINCKE PK (NEEDLE) IMPLANT
NEEDLE HYPO 22GX1.5 SAFETY (NEEDLE) ×2 IMPLANT
NEEDLE SPNL 18GX3.5 QUINCKE PK (NEEDLE) IMPLANT
NS IRRIG 1000ML POUR BTL (IV SOLUTION) ×6 IMPLANT
PACK BATTERY CMF DISP FOR DVR (ORTHOPEDIC DISPOSABLE SUPPLIES) ×1 IMPLANT
PACK CRANIOTOMY CUSTOM (CUSTOM PROCEDURE TRAY) ×2 IMPLANT
PATTIES SURGICAL .5 X.5 (GAUZE/BANDAGES/DRESSINGS) IMPLANT
PATTIES SURGICAL .5 X3 (DISPOSABLE) IMPLANT
PATTIES SURGICAL 1X1 (DISPOSABLE) ×1 IMPLANT
PIN MAYFIELD SKULL DISP (PIN) ×2 IMPLANT
SCREW UNIII AXS SD 1.5X4 (Screw) ×15 IMPLANT
SET CARTRIDGE AND TUBING (SET/KITS/TRAYS/PACK) ×1 IMPLANT
SPECIMEN JAR SMALL (MISCELLANEOUS) IMPLANT
SPONGE NEURO XRAY DETECT 1X3 (DISPOSABLE) IMPLANT
SPONGE SURGIFOAM ABS GEL 100 (HEMOSTASIS) ×2 IMPLANT
STAPLER VISISTAT 35W (STAPLE) ×2 IMPLANT
SUT ETHILON 3 0 FSL (SUTURE) IMPLANT
SUT ETHILON 3 0 PS 1 (SUTURE) IMPLANT
SUT MNCRL AB 3-0 PS2 18 (SUTURE) IMPLANT
SUT MON AB 3-0 SH 27 (SUTURE)
SUT MON AB 3-0 SH27 (SUTURE) IMPLANT
SUT NURALON 4 0 TR CR/8 (SUTURE) ×6 IMPLANT
SUT VIC AB 0 CT1 27 (SUTURE) ×2
SUT VIC AB 0 CT1 27XBRD ANTBC (SUTURE) IMPLANT
SUT VIC AB 2-0 CP2 18 (SUTURE) ×2 IMPLANT
TIP STANDARD 36KHZ (INSTRUMENTS) ×2
TIP STD 36KHZ (INSTRUMENTS) IMPLANT
TOWEL GREEN STERILE (TOWEL DISPOSABLE) ×2 IMPLANT
TOWEL GREEN STERILE FF (TOWEL DISPOSABLE) ×2 IMPLANT
TRAY FOLEY MTR SLVR 16FR STAT (SET/KITS/TRAYS/PACK) ×2 IMPLANT
TUBE CONNECTING 12X1/4 (SUCTIONS) ×2 IMPLANT
WATER STERILE IRR 1000ML POUR (IV SOLUTION) ×2 IMPLANT
WRENCH TORQUE 36KHZ (INSTRUMENTS) ×1 IMPLANT

## 2020-04-29 NOTE — Transfer of Care (Signed)
Immediate Anesthesia Transfer of Care Note  Patient: CURT OATIS  Procedure(s) Performed: LEFT CRANIECTOMY WITH TUMOR EXCISION (Left Head) APPLICATION OF CRANIAL NAVIGATION (Left Head)  Patient Location: PACU  Anesthesia Type:General  Level of Consciousness: awake and confused  Airway & Oxygen Therapy: Patient Spontanous Breathing  Post-op Assessment: Report given to RN and Post -op Vital signs reviewed and stable  Post vital signs: Reviewed and stable  Last Vitals:  Vitals Value Taken Time  BP 150/72 04/29/20 2109  Temp 36.8 C 04/29/20 2109  Pulse 91 04/29/20 2120  Resp 18 04/29/20 2120  SpO2 92 % 04/29/20 2120  Vitals shown include unvalidated device data.  Last Pain:  Vitals:   04/29/20 1543  TempSrc:   PainSc: 0-No pain      Patients Stated Pain Goal: 3 (33/38/32 9191)  Complications: No complications documented.

## 2020-04-29 NOTE — Progress Notes (Signed)
OT Cancellation Note  Patient Details Name: Anthony Downs MRN: [id number] DOB: August 11, 1950   Cancelled Treatment:    Reason Eval/Treat Not Completed: Other (comment)- OT order acknowledged, will follow after planned surgery 04/29/20.    Jolaine Artist, OT Acute Rehabilitation Services Pager [phone] Office [phone]   [name] 04/29/2020, 8:13 AM

## 2020-04-29 NOTE — Progress Notes (Addendum)
Neurosurgery Service Post-operative progress note  Assessment & Plan: 70 y.o. man s/p crani for parasagittal meningioma resection, seen in PACU, awake/alert but globally aphasic, PERRL, gaze conjugate, moving LUE spontaneously with good strength, not FC, good tone in BLE and RUE but not moving to command. 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 Will get [facility] tonight but can hold off on AEDs.

## 2020-04-29 NOTE — Progress Notes (Signed)
Neurosurgery Service Progress Note  Subjective: No acute events overnight, no new complaints this morning   Objective: Vitals:   04/28/20 2003 04/28/20 2335 04/29/20 0357 04/29/20 0824  BP: (!) 145/78 (!) 152/97 (!) 147/78 134/88  Pulse: 75 (!) 58 75 68  Resp: 16 16 17 19   Temp: 98.5 F (36.9 C) 97.8 F (36.6 C) 98.3 F (36.8 C) 97.7 F (36.5 C)  TempSrc: Oral Oral Oral Oral  SpO2:  99% 95% 99%  Weight:      Height:        Physical Exam: AOx3, PERRL, EOMI, FS Strength 5/5 on L RUE 4/5, RLE 3/5, SILTx4, no extinction, +RUE drift, face symmetric  Assessment & Plan: 70 y.o. man w/ progressive weakness in RLE > RUE, MRI w/ convexity meningioma invading the SSS. CTV w/ no clear flow through the occluded segment of the SSS.  -OR today for resection, 4N ICU post-op  Judith Part  04/29/20 12:48 PM

## 2020-04-29 NOTE — Op Note (Signed)
PATIENT: Anthony Downs  DAY OF SURGERY: 04/29/20   PRE-OPERATIVE DIAGNOSIS:  Parasagittal meningioma   POST-OPERATIVE DIAGNOSIS:  Same   PROCEDURE:  Biparietal craniectomy for resection of meningioma, titanium mesh cranioplasty   SURGEON:  Surgeon(s) and Role:    Judith Part, MD - Primary   ANESTHESIA: ETGA   BRIEF HISTORY: This is a 70 year old man who presented with progressive right sided weakness over only a few weeks to months. The patient was found to have a parasagittal meningioma with associated edema and brain compression. It was not present on a prior CT from just over 3 years ago. I therefore recommended surgical resection. This was discussed with the patient as well as risks, benefits, and alternatives and wished to proceed with surgery.   OPERATIVE DETAIL: The patient was taken to the operating room and placed on the OR table in the supine position. A formal time out was performed with two patient identifiers and confirmed the operative site. Anesthesia was induced by the anesthesia team. The [place] head holder was applied to the head and a registration array was attached to the [place]. This was co-registered with the patient's preoperative imaging, the fit appeared to be acceptable. Using frameless stereotaxy, the operative trajectory was planned and the incision was marked. Hair was clipped with surgical clippers over the incision and the area was then prepped and draped in a sterile fashion.  A linear incision was created, centered over the tumor. Given the involvement of the sinus, I planned the craniotomy to extend past the tumor in an anterior-posterior direction to have control of the sinus proximally and distally. Burr holes were placed bilaterally and a craniotomy flap was turned to expose both hemispheres with a larger exposure on the left. The dura was opened on the left with very close attention to sparing the bridging veins in the region and any veins  that were adherent to the dura.   The tumor was immediately evident, the left side of the tumor was resected first. It appeared consistent with a meningioma, but unfortunately was either very adherent or partially invasive into the adjacent cortex. The tumor was then devascularized medially and debulked with a CUSA. It was then carefully dissected freed from the associated cortex with, as required throughout this procedure, careful attention to avoid compromise of any draining veins. With the bulk of the tumor removed from the left, attention was turned to the sinus and right sided tumor.  The durotomy was extended across midline, across the occluded sinus, and onto the right of midline. The sinus was then tied off anteriorly and posteriorly with suture and the sinus was divided anteriorly and posteriorly. These cuts were then continued along the falx inferiorly to include all of the involved dura around the tumor with attention to avoid violation or injury to the inferior sagittal sinus. Posteriorly, there was some possible residual tumor remaining, but the sinus was again patent in that region. Given how for posterior it was, I did not want to sacrifice any of the remaining sinus and did not pursue any further resection. With the tumor and affected dura resected, hemostasis was confirmed, stereotaxy and visual inspection did not show any clear evidence of residual tumor, but I suspected there may be residual posteriorly invading the sinus.   Attention was then turned to reconstruction. Given that the tumor was infiltrating the bone on preop CT as well as visually, I did not replace the bone flap and performed a titanium mesh  cranioplasty. Prior to securing the mesh, since I had resected all of the dura, I laid down a large piece of duragen with some gelfoam in the epidural space over the sinus anteriorly and posteriorly.   All instrument and sponge counts were correct, the incision was then closed in  layers. The patient was then returned to anesthesia for emergence. No apparent complications at the completion of the procedure.   EBL:  642mL   DRAINS: none   SPECIMENS: Parasagittal meningioma, parafalcine dura r/o invasion, bone flap r/o invasion   Judith Part, MD 04/29/20 2:03 PM

## 2020-04-29 NOTE — Anesthesia Preprocedure Evaluation (Addendum)
Anesthesia Evaluation  Patient identified by MRN, date of birth, ID band Patient awake    Reviewed: Allergy & Precautions, NPO status , Patient's Chart, lab work & pertinent test results  History of Anesthesia Complications (+) PROLONGED EMERGENCE  Airway Mallampati: III  TM Distance: >3 FB Neck ROM: Full    Dental  (+) Teeth Intact   Pulmonary asthma , sleep apnea ,    Pulmonary exam normal        Cardiovascular hypertension,  Rhythm:Regular Rate:Normal     Neuro/Psych Brain tumor  negative psych ROS   GI/Hepatic Neg liver ROS, GERD  Medicated,  Endo/Other  negative endocrine ROS  Renal/GU negative Renal ROS  negative genitourinary   Musculoskeletal  (+) Arthritis , Osteoarthritis,    Abdominal (+)  Abdomen: soft. Bowel sounds: normal.  Peds  Hematology negative hematology ROS (+)   Anesthesia Other Findings   Reproductive/Obstetrics                            Anesthesia Physical Anesthesia Plan  ASA: III  Anesthesia Plan: General   Post-op Pain Management:    Induction: Intravenous  PONV Risk Score and Plan: 2 and Ondansetron, Dexamethasone, Midazolam and Treatment may vary due to age or medical condition  Airway Management Planned: Mask and Oral ETT  Additional Equipment: Arterial line  Intra-op Plan:   Post-operative Plan: Extubation in OR  Informed Consent: I have reviewed the patients History and Physical, chart, labs and discussed the procedure including the risks, benefits and alternatives for the proposed anesthesia with the patient or authorized representative who has indicated his/her understanding and acceptance.     Dental advisory given  Plan Discussed with: CRNA  Anesthesia Plan Comments: (Lab Results      Component                Value               Date                      WBC                      6.3                 04/28/2020                HGB                       13.9                04/28/2020                HCT                      42.8                04/28/2020                MCV                      95.1                04/28/2020                PLT  185                 04/28/2020           Lab Results      Component                Value               Date                      NA                       140                 04/28/2020                K                        3.7                 04/28/2020                CO2                      27                  04/28/2020                GLUCOSE                  123 (H)             04/28/2020                BUN                      10                  04/28/2020                CREATININE               1.06                04/28/2020                CALCIUM                  9.1                 04/28/2020                GFRNONAA                 >60                 04/28/2020                GFRAA                    70 (L)              04/04/2011          )        Anesthesia Quick Evaluation

## 2020-04-29 NOTE — Anesthesia Procedure Notes (Signed)
Procedure Name: Intubation Date/Time: 04/29/2020 4:11 PM Performed by: Dorthea Cove, CRNA Pre-anesthesia Checklist: Patient identified, Emergency Drugs available, Suction available and Patient being monitored Patient Re-evaluated:Patient Re-evaluated prior to induction Oxygen Delivery Method: Circle system utilized Preoxygenation: Pre-oxygenation with 100% oxygen Induction Type: IV induction Ventilation: Mask ventilation without difficulty and Oral airway inserted - appropriate to patient size Laryngoscope Size: Mac and 4 Grade View: Grade II Tube type: Oral Tube size: 7.5 mm Number of attempts: 1 Airway Equipment and Method: Stylet and Oral airway Placement Confirmation: ETT inserted through vocal cords under direct vision,  positive ETCO2 and breath sounds checked- equal and bilateral Secured at: 23 cm Tube secured with: Tape Dental Injury: Teeth and Oropharynx as per pre-operative assessment

## 2020-04-29 NOTE — Brief Op Note (Signed)
04/29/2020  8:49 PM  PATIENT:  [name]  70 y.o. male  PRE-OPERATIVE DIAGNOSIS:  Brain Tumor  POST-OPERATIVE DIAGNOSIS:  Brain Tumor  PROCEDURE:  Procedure(s): LEFT CRANIECTOMY WITH TUMOR EXCISION (Left) APPLICATION OF CRANIAL NAVIGATION (Left)  SURGEON:  Surgeon(s) and Role:    * Judith Part, MD - Primary  PHYSICIAN ASSISTANT:   ASSISTANTS: none   ANESTHESIA:   general  EBL:  600 mL   BLOOD ADMINISTERED:none  DRAINS: none   LOCAL MEDICATIONS USED:  LIDOCAINE   SPECIMEN:  Source of Specimen:  Parasagittal meningioma  DISPOSITION OF SPECIMEN:  PATHOLOGY  COUNTS:  YES  TOURNIQUET:  * No tourniquets in log *  DICTATION: .Note written in EPIC  PLAN OF CARE: Admit to inpatient   PATIENT DISPOSITION:  PACU - hemodynamically stable.   Delay start of Pharmacological VTE agent (>24hrs) due to surgical blood loss or risk of bleeding: yes

## 2020-04-29 NOTE — Anesthesia Procedure Notes (Signed)
Arterial Line Insertion Start/End4/12/2020 3:15 PM, 04/29/2020 3:30 PM Performed by: Dorthea Cove, CRNA, CRNA  Patient location: Pre-op. Preanesthetic checklist: patient identified, IV checked, site marked, risks and benefits discussed, surgical consent, monitors and equipment checked, pre-op evaluation, timeout performed and anesthesia consent Lidocaine 1% used for infiltration Right, radial was placed Catheter size: 20 Fr Hand hygiene performed  and maximum sterile barriers used   Attempts: 1 Procedure performed without using ultrasound guided technique. Following insertion, dressing applied and Biopatch. Post procedure assessment: normal and unchanged  Patient tolerated the procedure well with no immediate complications.

## 2020-04-30 LAB — GLUCOSE, CAPILLARY: Glucose-Capillary: 140 mg/dL — ABNORMAL HIGH (ref 70–99)

## 2020-04-30 MED ORDER — METOPROLOL TARTRATE 25 MG PO TABS: 25.0000 mg | ORAL_TABLET | Freq: Two times a day (BID) | ORAL | Status: DC

## 2020-04-30 NOTE — Evaluation (Signed)
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 Pt currently requires total +2 total (A) due to extensor tone. Pt noted to have tremor to R UE that started after movement.  Patient will benefit from skilled OT acutely to increase independence and safety with ADLS to allow discharge CIR.     Follow Up Recommendations  CIR    Equipment Recommendations  3 in 1 bedside commode;Wheelchair (measurements OT);Wheelchair cushion (measurements OT);Hospital bed    Recommendations for Other Services Rehab consult     Precautions / Restrictions Precautions Precautions: Fall Precaution Comments: Strong extensor tone Restrictions Weight Bearing Restrictions: No      Mobility Bed Mobility Overal bed mobility: Needs Assistance Bed Mobility: Rolling;Supine to Sit;Sit to Supine Rolling: Total assist;+2 for physical assistance;+2 for safety/equipment   Supine to sit: Total assist;+2 for physical assistance;+2 for safety/equipment;HOB elevated Sit to supine: Total assist;+2 for physical assistance;+2 for safety/equipment   General bed mobility comments: pt placed in side lying to help with checking extensor tone. pt with HOB elevated to help with supine to sit due to extensor tone. pt high risk to slide off EOB. pt  requires BLOCKING and then repositioning bil LE. pt total (A) posterior extension. Head used to help with flexion initiation unabel to sustain    Transfers                 General transfer comment: not safe to test at this time    Balance Overall balance assessment: Needs assistance Sitting-balance support: No upper extremity supported;Feet supported Sitting balance-Leahy Scale: Zero                                     ADL either performed or assessed with clinical judgement   ADL Overall ADL's : Needs assistance/impaired Eating/Feeding: Maximal assistance   Grooming: Modified independent   Upper Body Bathing: Total assistance   Lower Body Bathing: Total assistance       Lower Body Dressing: Total assistance                 General ADL Comments: pt was unable to lift bil LE to help with socks     Vision         Perception     Praxis      Pertinent Vitals/Pain Pain Assessment: No/denies pain     Hand Dominance Right   Extremity/Trunk Assessment Upper Extremity Assessment Upper Extremity Assessment: RUE deficits/detail;LUE deficits/detail RUE Deficits / Details: tremor noted but no activation noted. edema present RUE Sensation: decreased proprioception;decreased light touch RUE Coordination: decreased fine motor;decreased gross motor LUE Deficits / Details: able to follow commands with bil UE   Lower Extremity Assessment Lower Extremity Assessment: RLE deficits/detail;LLE deficits/detail RLE Deficits / Details: holding in full extensor tone. required increased  time and effort in side laying to break tone LLE Deficits / Details: full extensor tone   Cervical / Trunk Assessment Cervical / Trunk Assessment: Kyphotic;Other exceptions Cervical / Trunk Exceptions: Rounded shoulders   Communication Communication Communication: Expressive difficulties;Receptive difficulties   Cognition Arousal/Alertness: Awake/alert Behavior During  Therapy: Flat affect Overall Cognitive Status: Difficult to assess Area of Impairment: Following commands;Awareness                       Following Commands: Follows one step commands inconsistently;Follows one step commands with increased time   Awareness: Emergent   General Comments: pt requires 10-15 second delay to respond to questions. pt able to state name, wife name, that person in room is wife, location hospital, head surgery. pt attempting to don underwear and able to express embarrassment reason to wear bottoms   General Comments  needs repositioning frequently, VSS    Exercises     Shoulder Instructions      Home Living Family/patient expects to be discharged to:: Private residence Living Arrangements: Spouse/significant other Available Help at Discharge: Family;Available 24 hours/day Type of Home: House Home Access: Stairs to enter [employer] of Steps: 2 (large steps from garage) Entrance Stairs-Rails: Left (ascending, with wall on R) Home Layout: One level     Bathroom Shower/Tub: Walk-in shower         Home Equipment: Shower seat   Additional Comments: has a dog      Prior Functioning/Environment Level of Independence: Independent        Comments: Pt likes to do yard work. Pt is independent with all ADLs and functional mobility at baseline.        OT Problem List: Decreased strength;Decreased activity tolerance;Impaired balance (sitting and/or standing);Decreased coordination;Decreased cognition;Decreased safety awareness;Decreased knowledge of use of DME or AE;Decreased knowledge of precautions;Obesity;Impaired UE functional use;Impaired sensation      OT Treatment/Interventions: Self-care/ADL training;Therapeutic exercise;Neuromuscular education;Energy conservation;DME and/or AE instruction;Manual therapy;Modalities;Therapeutic activities;Cognitive remediation/compensation;Patient/family education;Balance training    OT  Goals(Current goals can be found in the care plan section) Acute Rehab OT Goals Patient Stated Goal: to be able to wear under clothing OT Goal Formulation: With patient/family Time For Goal Achievement: 05/14/20 Potential to Achieve Goals: Good  OT Frequency: Min 2X/week   Barriers to D/C:            Co-evaluation PT/OT/SLP Co-Evaluation/Treatment: Yes Reason for Co-Treatment: Necessary to address cognition/behavior during functional activity;For patient/therapist safety;To address functional/ADL transfers;Complexity of the patient's impairments (multi-system involvement) PT goals addressed during session: Mobility/safety with mobility;Balance OT goals addressed during session: ADL's and self-care;Proper use of Adaptive equipment and DME;Strengthening/ROM      AM-PAC OT "6 Clicks" Daily Activity     Outcome Measure Help from another person eating meals?: Total Help from another person taking care of personal grooming?: Total Help from another person toileting, which includes using toliet, bedpan, or urinal?: Total Help from another person bathing (including washing, rinsing, drying)?: Total Help from another person to put on and taking off regular upper body clothing?: Total Help from another person to put on and taking off regular lower body clothing?: Total 6 Click Score: 6   End of Session Nurse Communication: Mobility status;Precautions  Activity Tolerance: Patient tolerated treatment well Patient left: in bed;with call bell/phone within reach;with bed alarm set (put in chair position)  OT Visit Diagnosis: Unsteadiness on feet (R26.81);Muscle weakness (generalized) (M62.81)  Time: 1674-2552 OT Time Calculation (min): 21 min Charges:  OT General Charges $OT Visit: 1 Visit OT Evaluation $OT Eval Moderate Complexity: 1 Mod   Brynn, OTR/L  Acute Rehabilitation Services Pager: [phone] Office: [phone] .   Jeri Modena 04/30/2020, 1:53 PM

## 2020-04-30 NOTE — Progress Notes (Signed)
Per patient's wife, the amlodipine was discontinued by primary care provider 2 months ago b/c of swelling in his feet.  No other bp med was started at that time.  Lasix had been prescribed for fluid in lower extremities. Wife asks that the amlodipine be discontinued. Soyla Bainter C 10:03 AM

## 2020-04-30 NOTE — Progress Notes (Signed)
Neurosurgery Service Progress Note  Subjective: No acute events overnight, speech continued to improve significantly post-op   Objective: Vitals:   04/30/20 0500 04/30/20 0600 04/30/20 0700 04/30/20 0800  BP: (!) 119/50 (!) 124/59 (!) 151/58 132/64  Pulse: 67 85 75 73  Resp: 15 16 15 16   Temp:    97.6 F (36.4 C)  TempSrc:    Oral  SpO2: 94% 96% 94% 94%  Weight:      Height:        Physical Exam: Awake/alert, delayed response but able to speak in short sentences with appropriate answers, FC well in the LUE, bilateral lower extremities & RYE with preserved tone, 2/5 in RUE, 1/5 in BLE Incision c/d/i  Assessment & Plan: 70 y.o. man s/p craniectomy for parasagittal meningioma, post-op with SMA syndrome and bilateral leg weakness.  -MRI today to evaluate extent of resection -SMA symptoms should continue to improve over the next few days and weeks, will likely need rehab placement -okay to transfer to stepdown -d/c foley, A-line already out -hold SQH until 4/14, will evaluate his sinus on MRI to see if he needs ASA  Judith Part  04/30/20 8:43 AM

## 2020-04-30 NOTE — Evaluation (Signed)
Physical Therapy Evaluation Patient Details Name: Anthony Downs MRN: [id number] DOB: 04/05/1950 Today's Date: 04/30/2020   History of Present Illness  70 yo male presents 4/11 with progressive RLE > RUE weakness over the past few months. MRI revealed convexity meningioma, L>R with surrounding vasogenic edema in the left posterior frontal lobe. 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 PMH arthritis asthma skin Ca GERD HTN Sleep apnea SDH s/p fall 2008    Clinical Impression  Pt presents with condition above and deficits mentioned below, see PT Problem List. PTA, he was independent with all functional mobility and ADLs. Currently, pt demonstrates bil lower extremity weakness, gross incoordination, strong bil legs and trunk extensor tone which decreases in sidelying, and impaired balance that place him at high risk for falls. Pt currently requires +2 total (A) for all bed mobility and was unable to attempt a transfer OOB today due to safety concerns with his strong extensor tone. Pt is very motivated to improve and participate with therapy though, and would greatly benefit from intensive therapies in the CIR setting to maximize his independence and safety with all functional mobility prior to return home. Pt noted to have tremor to R UE that started after movement. Will continue to follow acutely.    Follow Up Recommendations CIR    Equipment Recommendations  3in1 (PT);Wheelchair (measurements PT);Wheelchair cushion (measurements PT);Hospital bed;Other (comment) (mechanical lift; may change as pt progresses)    Recommendations for Other Services Rehab consult     Precautions / Restrictions Precautions Precautions: Fall Precaution Comments: Strong extensor tone Restrictions Weight Bearing Restrictions: No      Mobility  Bed Mobility Overal bed mobility: Needs Assistance Bed Mobility:  Rolling;Supine to Sit;Sit to Supine Rolling: Total assist;+2 for physical assistance;+2 for safety/equipment   Supine to sit: Total assist;+2 for physical assistance;+2 for safety/equipment;HOB elevated Sit to supine: Total assist;+2 for physical assistance;+2 for safety/equipment   General bed mobility comments: pt placed in side lying to help with checking extensor tone, noted success in breaking extensor tone in sidelying compared to other positions. pt with HOB elevated to help with supine to sit due to extensor tone. pt high risk to slide off EOB. pt requires BLOCKING and then repositioning bil legs to prevent sliding off EOB while sitting EOB. pt total (A) posterior extension. Head used to help with flexion initiation, but unable to sustain.    Transfers                 General transfer comment: not safe to test at this time  Ambulation/Gait             General Gait Details: not safe to test at this time  Stairs            Wheelchair Mobility    Modified Rankin (Stroke Patients Only) Modified Rankin (Stroke Patients Only) Pre-Morbid Rankin Score: No significant disability Modified Rankin: Severe disability     Balance Overall balance assessment: Needs assistance Sitting-balance support: No upper extremity supported;Feet supported Sitting balance-Leahy Scale: Zero Sitting balance - Comments: Pt with max-TA posterior support with bil legs being blocked to prevent sliding off EOB. Cues provided to flex at hips, some success.       Standing balance comment: not safe to test at this time  Pertinent Vitals/Pain Pain Assessment: No/denies pain    Home Living Family/patient expects to be discharged to:: Private residence Living Arrangements: Spouse/significant other Available Help at Discharge: Family;Available 24 hours/day Type of Home: House Home Access: Stairs to enter Entrance Stairs-Rails: Left (ascending, with  wall on R) Entrance Stairs-Number of Steps: 2 (large steps from garage) Home Layout: One level Home Equipment: Shower seat Additional Comments: has a dog    Prior Function Level of Independence: Independent         Comments: Pt likes to do yard work. Pt is independent with all ADLs and functional mobility at baseline.     Hand Dominance   Dominant Hand: Right    Extremity/Trunk Assessment   Upper Extremity Assessment Upper Extremity Assessment: Defer to OT evaluation RUE Deficits / Details: tremor noted but no activation noted. edema present RUE Sensation: decreased proprioception;decreased light touch RUE Coordination: decreased fine motor;decreased gross motor LUE Deficits / Details: able to follow commands with bil UE    Lower Extremity Assessment Lower Extremity Assessment: RLE deficits/detail;LLE deficits/detail RLE Deficits / Details: holding in full extensor tone. required increased time and effort in side laying to break tone, but ultimatley successful in sidelying compared to other positions RLE Coordination: decreased fine motor;decreased gross motor LLE Deficits / Details: holding in full extensor tone. required increased time and effort in side laying to break tone, but ultimatley successful in sidelying compared to other positions LLE Coordination: decreased gross motor;decreased fine motor    Cervical / Trunk Assessment Cervical / Trunk Assessment: Kyphotic;Other exceptions Cervical / Trunk Exceptions: Rounded shoulders  Communication   Communication: Expressive difficulties;Receptive difficulties  Cognition Arousal/Alertness: Awake/alert Behavior During Therapy: Flat affect Overall Cognitive Status: Difficult to assess Area of Impairment: Following commands;Awareness                       Following Commands: Follows one step commands inconsistently;Follows one step commands with increased time   Awareness: Emergent   General Comments: pt  requires 10-15 second delay to respond to questions. pt able to state name, wife name, that person in room is wife, location hospital, head surgery. pt attempting to don underwear and able to express embarrassment for reason to wear bottoms. Pt became emotional a couple times during session.      General Comments General comments (skin integrity, edema, etc.): VSS    Exercises     Assessment/Plan    PT Assessment Patient needs continued PT services  PT Problem List Decreased strength;Decreased range of motion;Decreased activity tolerance;Decreased balance;Decreased mobility;Decreased coordination;Decreased cognition;Decreased knowledge of use of DME;Decreased safety awareness;Impaired sensation       PT Treatment Interventions DME instruction;Gait training;Stair training;Functional mobility training;Therapeutic activities;Therapeutic exercise;Balance training;Neuromuscular re-education;Cognitive remediation;Patient/family education    PT Goals (Current goals can be found in the Care Plan section)  Acute Rehab PT Goals Patient Stated Goal: to be able to wear under clothing and to improve to go home PT Goal Formulation: With patient/family Time For Goal Achievement: 05/14/20 Potential to Achieve Goals: Good    Frequency Min 4X/week   Barriers to discharge        Co-evaluation PT/OT/SLP Co-Evaluation/Treatment: Yes Reason for Co-Treatment: Complexity of the patient's impairments (multi-system involvement);Necessary to address cognition/behavior during functional activity;For patient/therapist safety;To address functional/ADL transfers PT goals addressed during session: Mobility/safety with mobility;Balance;Strengthening/ROM OT goals addressed during session: ADL's and self-care;Proper use of Adaptive equipment and DME;Strengthening/ROM       AM-PAC PT "6 Clicks" Mobility  Outcome Measure  Help needed turning from your back to your side while in a flat bed without using bedrails?:  Total Help needed moving from lying on your back to sitting on the side of a flat bed without using bedrails?: Total Help needed moving to and from a bed to a chair (including a wheelchair)?: Total Help needed standing up from a chair using your arms (e.g., wheelchair or bedside chair)?: Total Help needed to walk in hospital room?: Total Help needed climbing 3-5 steps with a railing? : Total 6 Click Score: 6    End of Session   Activity Tolerance: Other (comment) (limited by extensor tone) Patient left: in bed;with call bell/phone within reach;with bed alarm set;with family/visitor present (with bed in chair-like position with feet supported on wedge on end of bed rail to encourage hip, knee, and ankle flexion) Nurse Communication: Mobility status PT Visit Diagnosis: Unsteadiness on feet (R26.81);Muscle weakness (generalized) (M62.81);Difficulty in walking, not elsewhere classified (R26.2);Other symptoms and signs involving the nervous system (R29.898)    Time: 1937-9024 PT Time Calculation (min) (ACUTE ONLY): 31 min   Charges:   PT Evaluation $PT Eval Moderate Complexity: 1 Mod          Moishe Spice, PT, DPT Acute Rehabilitation Services  Pager: [phone] Office: [phone]   Orvan Falconer 04/30/2020, 2:41 PM

## 2020-04-30 NOTE — Progress Notes (Signed)
Inpatient Rehab Admissions Coordinator Note:   Per therapy recommendations, pt was screened for CIR candidacy by Shann Medal, PT, DPT.  At this time we are recommending a CIR consult and I will place an order per our protocol.  Please contact me with questions.   Shann Medal, PT, DPT [phone] 04/30/20 3:19 PM

## 2020-04-30 NOTE — Anesthesia Postprocedure Evaluation (Signed)
Anesthesia Post Note  Patient: EVENS MENO  Procedure(s) Performed: LEFT CRANIECTOMY WITH TUMOR EXCISION (Left Head) APPLICATION OF CRANIAL NAVIGATION (Left Head)     Patient location during evaluation: PACU Anesthesia Type: General Level of consciousness: awake and alert Pain management: pain level controlled Vital Signs Assessment: post-procedure vital signs reviewed and stable Respiratory status: spontaneous breathing, nonlabored ventilation, respiratory function stable and patient connected to nasal cannula oxygen Cardiovascular status: blood pressure returned to baseline and stable Postop Assessment: no apparent nausea or vomiting Anesthetic complications: no   No complications documented.  Last Vitals:  Vitals:   04/30/20 0400 04/30/20 0500  BP: 116/63 (!) 119/50  Pulse: 71 67  Resp: 14 15  Temp: (!) 36.4 C   SpO2: 95% 94%    Last Pain:  Vitals:   04/30/20 0400  TempSrc: Oral  PainSc:                  Arline Ketter,W. EDMOND

## 2020-05-01 ENCOUNTER — Encounter (HOSPITAL_COMMUNITY): Payer: Self-pay | Admitting: Neurological Surgery

## 2020-05-01 ENCOUNTER — Inpatient Hospital Stay (HOSPITAL_COMMUNITY): Payer: Medicare Other

## 2020-05-01 DIAGNOSIS — D496 Neoplasm of unspecified behavior of brain: Secondary | ICD-10-CM

## 2020-05-01 LAB — TYPE AND SCREEN
ABO/RH(D): O POS
Antibody Screen: NEGATIVE
Unit division: 0
Unit division: 0

## 2020-05-01 LAB — BPAM RBC
Blood Product Expiration Date: 202205112359
Blood Product Expiration Date: 202205112359
ISSUE DATE / TIME: 202204121640
ISSUE DATE / TIME: 202204121640
Unit Type and Rh: 5100
Unit Type and Rh: 5100

## 2020-05-01 MED ORDER — GADOBUTROL 1 MMOL/ML IV SOLN
10.0000 mL | Freq: Once | INTRAVENOUS | Status: AC | PRN
  Administered 2020-05-01: 10 mL via INTRAVENOUS

## 2020-05-01 MED ORDER — HEPARIN SODIUM (PORCINE) 5000 UNIT/ML IJ SOLN
5000.0000 [IU] | Freq: Three times a day (TID) | INTRAMUSCULAR | Status: DC
  Administered 2020-05-01 – 2020-05-02 (×4): 5000 [IU] via SUBCUTANEOUS
  Filled 2020-05-01 (×4): qty 1

## 2020-05-01 NOTE — Progress Notes (Signed)
Neurosurgery Service Progress Note  Subjective: No acute events overnight, speech continues to improve  Objective: Vitals:   05/01/20 0400 05/01/20 0500 05/01/20 0600 05/01/20 0700  BP: (!) 162/69 (!) 146/65 (!) 155/102 (!) 142/53  Pulse: 89 71 73 66  Resp: 19 15 16  (!) 9  Temp: 97.8 F (36.6 C)     TempSrc: Oral     SpO2: 98% 96% 94% 98%  Weight:      Height:        Physical Exam: Awake/alert, delayed response but able to speak in short sentences with appropriate answers, FC well in the LUE, bilateral lower extremities & RYE with preserved tone, 2/5 in RUE, 1/5 in BLE Incision c/d/i  Assessment & Plan: 70 y.o. man s/p craniectomy for parasagittal meningioma, post-op with SMA syndrome and bilateral leg weakness. MRI w/ near total versus gross total resection, ischemia along the tumor margins that extends deeper on the left, concerning for venous infarcts.  -SMA symptoms should continue to improve over the next few days and weeks, will likely need rehab placement -okay to transfer to [facility]NF -start [place]  05/01/20 7:50 AM

## 2020-05-01 NOTE — PMR Pre-admission (Signed)
PMR Admission Coordinator Pre-Admission Assessment  Patient: Anthony Downs is an 70 y.o., male MRN: [id number] DOB: [birth year] Height: 6' (182.9 cm) Weight: (!) 137.9 kg              Insurance Information HMO:     PPO:      PCP:      IPA:      80/20:      OTHER:  PRIMARY: Medicare A and B      Policy#: [id number]      Subscriber: patient CM Name:        Phone#:       Fax#:   Pre-Cert#:        Employer: Retired Benefits:  Phone #:       Name: Checked in [place]. Date: 01/18/14     Deduct: $1556      Out of Pocket Max:  None      Life Max: N/A  CIR: 100%      SNF: 100 days Outpatient: 80%     Co-Pay: 20% Home Health: 100%      Co-Pay: none DME: 80%     Co-Pay: 20% Providers: patient's choice  SECONDARY: U[employer]       Policy#: [id number]      Phone#: [phone]  Financial Counselor:        Phone#:    The "Data Collection Information Summary" for patients in Inpatient Rehabilitation Facilities with attached "Privacy Act [place] Records" was provided and verbally reviewed with: Patient and Family  Emergency Contact Information Contact Information    Name Relation Home Work Mobile   Payton,[name] Spouse [phone]  [phone]   [place] Daughter   [phone]     Current Medical History  Patient Admitting Diagnosis: Bifrontal meningiomas, L crani for tumor resection  History of Present Illness: A 70 y.o. right-handed male with history of hypertension, hyperlipidemia, subdural hematoma post traumatic fell off a truck striking his head on concrete 2008.  Per chart review lives with spouse.  1 level home with 2 steps to entry.  Independent prior to admission.  Independent prior to admission as well as independent ADLs.  Presented 04/28/2020 with progressive right lower extremity greater than right upper extremity weakness over the past few months.  He had a baseline slowly progressive tremor in the right upper extremity.  Denied any seizure.  MRI  imaging revealed convexity meningioma left greater than right with surrounding vasogenic edema in the left posterior frontal lobe.  Patient underwent left craniectomy with tumor excision 04/29/2020 per Dr. Venetia Constable.  Maintained on Decadron protocol.  Tolerating a regular diet.  Therapy evaluations completed due to patient's progressive weakness recommendations of physical medicine rehab consult.    Glasgow Coma Scale Score: 15  Past Medical History  Past Medical History:  Diagnosis Date  . Arthritis   . Asthma    as a child  . 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 Complication of anesthesia    slow to wake up and pain medicines make him sick  . GERD (gastroesophageal reflux disease)   . Hypertension   . PONV (postoperative nausea and vomiting)   . Sleep apnea    no Cpap  . Subdural hematoma, post-traumatic ([place]) 2008   fell off truck hit head on concrete    Family History  family history includes Aortic aneurysm in his father; Breast cancer in his mother.  Prior Rehab/Hospitalizations:  Has the  patient had prior rehab or hospitalizations prior to admission? No  Has the patient had major surgery during 100 days prior to admission? Yes  Current Medications   Current Facility-Administered Medications:  .  0.9 %  sodium chloride infusion, , Intravenous, Continuous, Ostergard, Joyice Faster, MD, Stopped at 04/30/20 [phone] .  acetaminophen (TYLENOL) tablet 650 mg, 650 mg, Oral, Q4H PRN, 650 mg at 05/01/20 1536 **OR** acetaminophen (TYLENOL) suppository 650 mg, 650 mg, Rectal, Q4H PRN, Judith Part, MD .  amLODipine (NORVASC) tablet 10 mg, 10 mg, Oral, Daily, Ostergard, Joyice Faster, MD, 10 mg at 04/30/20 0956 .  atorvastatin (LIPITOR) tablet 10 mg, 10 mg, Oral, QHS, Ostergard, Joyice Faster, MD, 10 mg at 05/01/20 2113 .  Chlorhexidine Gluconate Cloth 2 % PADS 6 each, 6 each, Topical, Daily, Judith Part, MD, 6 each at 04/30/20 2010 .  dexamethasone  (DECADRON) injection 4 mg, 4 mg, Intravenous, Q12H, Ostergard, Joyice Faster, MD, 4 mg at 05/02/20 1008 .  docusate sodium (COLACE) capsule 100 mg, 100 mg, Oral, BID, Judith Part, MD, 100 mg at 05/02/20 1008 .  furosemide (LASIX) tablet 20 mg, 20 mg, Oral, Daily, Ostergard, Thomas A, MD, 20 mg at 05/02/20 1008 .  heparin injection 5,000 Units, 5,000 Units, Subcutaneous, Q8H, Judith Part, MD, 5,000 Units at 05/02/20 0558 .  HYDROcodone-acetaminophen (NORCO/VICODIN) 5-325 MG per tablet 1 tablet, 1 tablet, Oral, Q4H PRN, Judith Part, MD, 1 tablet at 05/02/20 0238 .  HYDROmorphone (DILAUDID) injection 0.5 mg, 0.5 mg, Intravenous, Q3H PRN, Judith Part, MD, 0.5 mg at 04/30/20 0725 .  labetalol (NORMODYNE) injection 10-40 mg, 10-40 mg, Intravenous, Q10 min PRN, Judith Part, MD .  loratadine (CLARITIN) tablet 10 mg, 10 mg, Oral, Daily, Ostergard, Thomas A, MD, 10 mg at 05/02/20 1008 .  ondansetron (ZOFRAN) tablet 4 mg, 4 mg, Oral, Q4H PRN, 4 mg at 05/01/20 1912 **OR** ondansetron (ZOFRAN) injection 4 mg, 4 mg, Intravenous, Q4H PRN, Ostergard, Thomas A, MD .  pantoprazole (PROTONIX) EC tablet 40 mg, 40 mg, Oral, Daily, Ostergard, Joyice Faster, MD, 40 mg at 05/02/20 1008 .  polyethylene glycol (MIRALAX / GLYCOLAX) packet 17 g, 17 g, Oral, Daily PRN, Ostergard, Thomas A, MD .  polyvinyl alcohol (LIQUIFILM TEARS) 1.4 % ophthalmic solution 1 drop, 1 drop, Both Eyes, Daily PRN, Ostergard, Thomas A, MD .  promethazine (PHENERGAN) tablet 12.5-25 mg, 12.5-25 mg, Oral, Q4H PRN, Judith Part, MD  Patients Current Diet:  Diet Order            Diet regular Room service appropriate? Yes; Fluid consistency: Thin  Diet effective now                 Precautions / Restrictions Precautions Precautions: Fall Precaution Comments: Strong extensor tone Restrictions Weight Bearing Restrictions: No   Has the patient had 2 or more falls or a fall with injury in the past  year?No  Prior Activity Level Community (5-7x/wk): Went out daily  Prior Functional Level Prior Function Level of Independence: Independent Comments: Pt likes to do yard work. Pt is independent with all ADLs and functional mobility at baseline.  Self Care: Did the patient need help bathing, dressing, using the toilet or eating?  Independent  Indoor Mobility: Did the patient need assistance with walking from room to room (with or without device)? Independent  Stairs: Did the patient need assistance with internal or external stairs (with or without device)? Independent  Functional Cognition: Did the patient need  help planning regular tasks such as shopping or remembering to take medications? Independent  Home Assistive Devices / Equipment Home Assistive Devices/Equipment: None Home Equipment: Shower seat  Prior Device Use: Indicate devices/aids used by the patient prior to current illness, exacerbation or injury? None of the above  Current Functional Level Cognition  Arousal/Alertness: Awake/alert Overall Cognitive Status: Difficult to assess Difficult to assess due to: Impaired communication Orientation Level: Oriented X4 Following Commands: Follows one step commands inconsistently,Follows one step commands with increased time General Comments: pt requires 10-15 second delay to respond to questions. Pt with improved coversation, expressing his thoughts better this date. Pt aware and reports understanding of cues to flex his legs and trunk but that he just cannot get his body to do what he wants it to. Attention: Sustained Sustained Attention: Appears intact    Extremity Assessment (includes Sensation/Coordination)  Upper Extremity Assessment: Defer to OT evaluation RUE Deficits / Details: tremor noted but no activation noted. edema present RUE Sensation: decreased proprioception,decreased light touch RUE Coordination: decreased fine motor,decreased gross motor LUE Deficits /  Details: able to follow commands with bil UE  Lower Extremity Assessment: RLE deficits/detail,LLE deficits/detail RLE Deficits / Details: holding in full extensor tone. required increased time and effort in side laying to break tone, but ultimatley successful in sidelying compared to other positions RLE Coordination: decreased fine motor,decreased gross motor LLE Deficits / Details: holding in full extensor tone. required increased time and effort in side laying to break tone, but ultimatley successful in sidelying compared to other positions LLE Coordination: decreased gross motor,decreased fine motor    ADLs  Overall ADL's : Needs assistance/impaired Eating/Feeding: Maximal assistance Grooming: Modified independent Upper Body Bathing: Total assistance Lower Body Bathing: Total assistance Lower Body Dressing: Total assistance General ADL Comments: pt was unable to lift bil LE to help with socks    Mobility  Overal bed mobility: Needs Assistance Bed Mobility: Rolling Rolling: Total assist,+2 for physical assistance,+2 for safety/equipment,Max assist Supine to sit: Total assist,+2 for physical assistance,+2 for safety/equipment,HOB elevated Sit to supine: Total assist,+2 for physical assistance,+2 for safety/equipment General bed mobility comments: Improved breaking of extensor tone in sidelying this date compared to prior session. Pt needing TAx2 to roll to L. Positioned L leg in flexion and cued pt to reach L arm across for PT's hand on R to assist with roll to R, maxAx2.    Transfers  Overall transfer level: Needs assistance Equipment used: 2 person hand held assist Armed forces operational officer) Transfer via Lift Equipment: [employer] Transfers: Sit to/from Stand Sit to Stand: Total assist,+2 physical assistance,+2 safety/equipment General transfer comment: Pt rolled onto maxisky pad in bed and dependently placed his hips and knees in flexion while supine prior to start of use of maxisky to lift pt off bed  to chair dependently. From recliner, attempted sit to stand x3 trials, but unable to clear buttocks with TAx2 and cues to push up from arm rests.    Ambulation / Gait / Stairs / Wheelchair Mobility  Ambulation/Gait General Gait Details: not safe to test at this time    Posture / Balance Dynamic Sitting Balance Sitting balance - Comments: Pt needing Max-TA to lean anteriorly in chair. Balance Overall balance assessment: Needs assistance Sitting-balance support: No upper extremity supported,Feet supported Sitting balance-Leahy Scale: Zero Sitting balance - Comments: Pt needing Max-TA to lean anteriorly in chair. Standing balance comment: not safe to test at this time    Special needs/care consideration Continuous Drip IV  0.9% NS at  75 ml/hr and Skin Left scalp incision with stapes     Previous Home Environment (from acute therapy documentation) Living Arrangements: Spouse/significant other  Lives With: Spouse Available Help at Discharge: Family,Available 24 hours/day Type of Home: House Home Layout: One level Home Access: Stairs to enter Entrance Stairs-Rails: Left (ascending, with wall on R) Entrance Stairs-Number of Steps: 2 (large steps from garage) Bathroom Shower/Tub: Gaffer Home Care Services: No Additional Comments: has a dog  Discharge Living Setting Plans for Discharge Living Setting: Patient's home,House,Lives with (comment) (Lives with wife) Type of Home at Discharge: House Discharge Home Layout: One level Discharge Home Access: Stairs to enter Entrance Stairs-Number of Steps: 2 at garage entry and 4 at front entry (Plans to look into getting a ramp) Discharge Bathroom Shower/Tub: Walk-in shower,Door Discharge Bathroom Toilet: Handicapped height Discharge Bathroom Accessibility: Yes How Accessible: Accessible via wheelchair,Accessible via walker Does the patient have any problems obtaining your medications?: No  Social/Family/Support Systems Patient  Roles: Spouse,Parent (Has a wife and daughter.) Contact Information: Luchiano Viscomi - wife Anticipated Caregiver: wife, daughter and others Ability/Limitations of Caregiver: Wife is not working and can provide supervision and light assistance. Caregiver Availability: 24/7 Discharge Plan Discussed with Primary Caregiver: Yes Is Caregiver In Agreement with Plan?: Yes Does Caregiver/Family have Issues with Lodging/Transportation while Pt is in Rehab?: No  Goals Patient/Family Goal for Rehab: PT/OT mod/max assist and SLP S/Min assist goals Expected length of stay: 3-4 weeks Cultural Considerations: None Pt/Family Agrees to Admission and willing to participate: Yes Program Orientation Provided & Reviewed with Pt/Caregiver Including Roles  & Responsibilities: Yes  Decrease burden of Care through IP rehab admission: N/A  Possible need for SNF placement upon discharge: Yes, if patient does not progress to point where wife and family can manage at home after CIR stay.  Patient Condition: This patient's condition remains as documented in the consult dated 05/01/20, in which the Rehabilitation Physician determined and documented that the patient's condition is appropriate for intensive rehabilitative care in an inpatient rehabilitation facility. Will admit to inpatient rehab today.  Preadmission Screen Completed By:  Retta Diones, RN, 05/02/2020 10:31 AM ______________________________________________________________________   Discussed status with Dr. Naaman Plummer on 05/02/20 at 0930 and received approval for admission today.  Admission Coordinator:  Retta Diones, time 1031/Date 05/01/20

## 2020-05-01 NOTE — Evaluation (Signed)
Speech Language Pathology Evaluation Patient Details Name: Anthony Downs MRN: [id number] DOB: 1950/08/21 Today's Date: 05/01/2020 Time: 1941-7408 SLP Time Calculation (min) (ACUTE ONLY): 28 min  Problem List:  Patient Active Problem List   Diagnosis Date Noted  . Brain tumor ([place]) 04/28/2020  . S/P nasal septoplasty 03/19/2020  . Special screening for malignant neoplasms, colon 01/09/2018   Past Medical History:  Past Medical History:  Diagnosis Date  . Arthritis   . Asthma    as a child  . 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 Complication of anesthesia    slow to wake up and pain medicines make him sick  . GERD (gastroesophageal reflux disease)   . Hypertension   . PONV (postoperative nausea and vomiting)   . Sleep apnea    no Cpap  . Subdural hematoma, post-traumatic ([place]) 2008   fell off truck hit head on concrete   Past Surgical History:  Past Surgical History:  Procedure Laterality Date  . APPLICATION OF CRANIAL NAVIGATION Left 04/29/2020   Procedure: APPLICATION OF CRANIAL NAVIGATION;  Surgeon: Judith Part, MD;  Location: [facility];  Service: Neurosurgery;  Laterality: Left;  . arthroscopic knee    . COLONOSCOPY N/A 11/29/2018   Procedure: COLONOSCOPY;  Surgeon: Rogene Houston, MD;  [facility];  Service: Endoscopy;  Laterality: N/A;  730-rescheduled 9/2 same time per Lelon Frohlich  . CRANIOTOMY Left 04/29/2020   Procedure: LEFT CRANIECTOMY WITH TUMOR EXCISION;  Surgeon: Judith Part, MD;  Location: [facility];  Service: Neurosurgery;  Laterality: Left;  . NASAL SEPTOPLASTY W/ TURBINOPLASTY Bilateral 03/19/2020   Procedure: NASAL SEPTOPLASTY WITH BILATERAL  TURBINATE REDUCTION;  Surgeon: Leta Baptist, MD;  Location: [facility];  Service: ENT;  Laterality: Bilateral;  . VASECTOMY     HPI:  70 yo male presents 4/11 with progressive RLE > RUE weakness over the past few months. MRI revealed convexity meningioma, L>R with  surrounding vasogenic edema in the left posterior frontal lobe. 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 PMH arthritis asthma skin Ca GERD HTN Sleep apnea SDH s/p fall 2008   Assessment / Plan / Recommendation Clinical Impression  Pt presents with a mild expressive aphasia marked by slow, deliberate output, but improved fluency and no dysarthria.  Pt has difficulty with higher-level word-retrieval (divergent naming) and mild difficulty with responsive naming tasks. Confrontational naming is WNL.  He is able to follow multistep commands and answered questions correctly after listening to a short paragraph.  Repetition of single words and complex sentences is WNL. Pt will benefit from acute SLP f/u to address mild aphasia - educated pt and his wife, [name], regarding results of assessment and plan.  They agree.    SLP Assessment  SLP Recommendation/Assessment: Patient needs continued Speech Lanaguage Pathology Services SLP Visit Diagnosis: Aphasia (R47.01)    Follow Up Recommendations  Inpatient Rehab    Frequency and Duration min 2x/week  1 week      SLP Evaluation Cognition  Overall Cognitive Status: Within Functional Limits for tasks assessed Arousal/Alertness: Awake/alert Orientation Level: Oriented X4 Attention: Sustained Sustained Attention: Appears intact       Comprehension  Auditory Comprehension Overall Auditory Comprehension: Appears within functional limits for tasks assessed Yes/No Questions: Within Functional Limits Commands: Within Functional Limits Conversation: Simple Visual Recognition/Discrimination Discrimination: Within Function Limits Reading Comprehension Reading Status: Not tested  Expression Expression Primary Mode of Expression: Verbal Verbal Expression Overall Verbal Expression: Impaired Initiation: No impairment Automatic Speech: Social  Response;Counting Level of Generative/Spontaneous Verbalization: Conversation Repetition: No impairment Naming: Impairment Responsive: 76-100% accurate Confrontation: Within functional limits Divergent: 25-49% accurate Pragmatics: No impairment Written Expression Dominant Hand: Right Written Expression: Not tested   Oral / Motor  Oral Motor/Sensory Function Overall Oral Motor/Sensory Function: Within functional limits Motor Speech Overall Motor Speech: Appears within functional limits for tasks assessed   GO                    Juan Quam Laurice 05/01/2020, 10:03 AM  Estill Bamberg L. Tivis Ringer, [place] Office number [phone] Pager [phone]

## 2020-05-01 NOTE — Consult Note (Signed)
Physical Medicine and Rehabilitation Consult Reason for Consult: Progressive right lower extremity right upper extremity weakness Referring Physician: Dr. Venetia Constable   HPI: Anthony Downs is a 70 y.o. right-handed male with history of hypertension, hyperlipidemia, subdural hematoma post traumatic fell off a truck striking his head on concrete 2008.  Per chart review lives with spouse.  1 level home with 2 steps to entry.  Independent prior to admission.  Independent prior to admission as well as independent ADLs.  Presented 04/28/2020 with progressive right lower extremity greater than right upper extremity weakness over the past few months.  He had a baseline slowly progressive tremor in the right upper extremity.  Denied any seizure.  MRI imaging revealed convexity meningioma left greater than right with surrounding vasogenic edema in the left posterior frontal lobe.  Patient underwent left craniectomy with tumor excision 04/29/2020 per Dr. Venetia Constable.  Maintained on Decadron protocol.  Tolerating a regular diet.  Therapy evaluations completed due to patient's progressive weakness recommendations of physical medicine rehab consult.   Review of Systems  Constitutional: Negative for chills and fever.  HENT: Negative for hearing loss.   Eyes: Negative for blurred vision and double vision.  Respiratory: Negative for cough and shortness of breath.   Cardiovascular: Negative for chest pain, palpitations and leg swelling.  Gastrointestinal: Positive for constipation. Negative for heartburn, nausea and vomiting.       GERD  Genitourinary: Negative for dysuria and hematuria.  Musculoskeletal: Positive for myalgias.  Skin: Negative for rash.  Neurological: Positive for tremors, weakness and headaches.  All other systems reviewed and are negative.  Past Medical History:  Diagnosis Date  . Arthritis   . Asthma    as a child  . 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 Complication of anesthesia    slow to wake up and pain medicines make him sick  . GERD (gastroesophageal reflux disease)   . Hypertension   . PONV (postoperative nausea and vomiting)   . Sleep apnea    no Cpap  . Subdural hematoma, post-traumatic ([place]) 2008   fell off truck hit head on concrete   Past Surgical History:  Procedure Laterality Date  . arthroscopic knee    . COLONOSCOPY N/A 11/29/2018   Procedure: COLONOSCOPY;  Surgeon: Rogene Houston, MD;  Location: [facility];  Service: Endoscopy;  Laterality: N/A;  730-rescheduled 9/2 same time per Lelon Frohlich  . NASAL SEPTOPLASTY W/ TURBINOPLASTY Bilateral 03/19/2020   Procedure: NASAL SEPTOPLASTY WITH BILATERAL  TURBINATE REDUCTION;  Surgeon: Leta Baptist, MD;  Location: [facility];  Service: ENT;  Laterality: Bilateral;  . VASECTOMY     Family History  Problem Relation Age of Onset  . Breast cancer Mother   . Aortic aneurysm Father    Social History:  reports that he has never smoked. He has never used smokeless tobacco. He reports that he does not drink alcohol and does not use drugs. Allergies: No Known Allergies Medications Prior to Admission  Medication Sig Dispense Refill  . aspirin EC 81 MG tablet Take 81 mg by mouth at bedtime. Swallow whole.    Marland Kitchen atorvastatin (LIPITOR) 10 MG tablet Take 10 mg by mouth at bedtime.     . cetirizine (ZYRTEC) 10 MG tablet Take 10 mg by mouth daily as needed for allergies.    Marland Kitchen docusate sodium (COLACE) 250 MG capsule Take 500 mg by mouth 2 (two) times daily.    Marland Kitchen  furosemide (LASIX) 20 MG tablet Take 20 mg by mouth daily.    . pantoprazole (PROTONIX) 40 MG tablet Take 40 mg by mouth daily.    Marland Kitchen Propylene Glycol-Glycerin (SOOTHE OP) Apply 1 drop to eye daily as needed (dry eyes).    . tamsulosin (FLOMAX) 0.4 MG CAPS capsule Take 0.4 mg by mouth at bedtime.    Marland Kitchen ZINC-VITAMIN C PO Take 1 tablet by mouth daily.      Home: Home Living Family/patient expects to be discharged to:: Private  residence Living Arrangements: Spouse/significant other Available Help at Discharge: Family,Available 24 hours/day Type of Home: House Home Access: Stairs to enter [employer] of Steps: 2 (large steps from garage) Entrance Stairs-Rails: Left (ascending, with wall on R) Home Layout: One level Bathroom Shower/Tub: Walk-in shower Home Equipment: Shower seat Additional Comments: has a Haematologist History: Prior Function Level of Independence: Independent Comments: Pt likes to do yard work. Pt is independent with all ADLs and functional mobility at baseline. Functional Status:  Mobility: Bed Mobility Overal bed mobility: Needs Assistance Bed Mobility: Rolling,Supine to Sit,Sit to Supine Rolling: Total assist,+2 for physical assistance,+2 for safety/equipment Supine to sit: Total assist,+2 for physical assistance,+2 for safety/equipment,HOB elevated Sit to supine: Total assist,+2 for physical assistance,+2 for safety/equipment General bed mobility comments: pt placed in side lying to help with checking extensor tone, noted success in breaking extensor tone in sidelying compared to other positions. pt with HOB elevated to help with supine to sit due to extensor tone. pt high risk to slide off EOB. pt requires BLOCKING and then repositioning bil legs to prevent sliding off EOB while sitting EOB. pt total (A) posterior extension. Head used to help with flexion initiation, but unable to sustain. Transfers General transfer comment: not safe to test at this time Ambulation/Gait General Gait Details: not safe to test at this time    ADL: ADL Overall ADL's : Needs assistance/impaired Eating/Feeding: Maximal assistance Grooming: Modified independent Upper Body Bathing: Total assistance Lower Body Bathing: Total assistance Lower Body Dressing: Total assistance General ADL Comments: pt was unable to lift bil LE to help with socks  Cognition: Cognition Overall Cognitive Status:  Difficult to assess Orientation Level: Oriented X4 Cognition Arousal/Alertness: Awake/alert Behavior During Therapy: Flat affect Overall Cognitive Status: Difficult to assess Area of Impairment: Following commands,Awareness Following Commands: Follows one step commands inconsistently,Follows one step commands with increased time Awareness: Emergent General Comments: pt requires 10-15 second delay to respond to questions. pt able to state name, wife name, that person in room is wife, location hospital, head surgery. pt attempting to don underwear and able to express embarrassment for reason to wear bottoms. Pt became emotional a couple times during session. Difficult to assess due to: Impaired communication  Blood pressure (!) 146/65, pulse 71, temperature 97.8 F (36.6 C), temperature source Oral, resp. rate 15, height 6' (1.829 m), weight (!) 137.9 kg, SpO2 96 %. Physical Exam HENT:     Head:     Comments: Craniectomy site clean and dry    Nose: Nose normal.  Eyes:     Extraocular Movements: Extraocular movements intact.     Conjunctiva/sclera: Conjunctivae normal.     Pupils: Pupils are equal, round, and reactive to light.  Cardiovascular:     Rate and Rhythm: Normal rate.     Pulses: Normal pulses.  Pulmonary:     Effort: Pulmonary effort is normal.  Abdominal:     Palpations: Abdomen is soft.  Musculoskeletal:  General: Swelling present.     Cervical back: Normal range of motion.  Skin:    General: Skin is warm.  Neurological:     Mental Status: He is alert.     Comments: Alert but delayed. Follows simple commands. Able to provide basic biographical information. Slow to process but answered questions with slow, measure speech. LUE 2-3/5 prox to distal. RUE tr-0/5. BLE 0/5. Senses pain in all 4's. + extensor tone in LE's. DTR's 3+ all 4's.   Psychiatric:     Comments: Flat but cooperative     Results for orders placed or performed during the hospital encounter of  04/28/20 (from the past 24 hour(s))  Glucose, capillary     Status: Abnormal   Collection Time: 04/30/20  6:20 AM  Result Value Ref Range   Glucose-Capillary 140 (H) 70 - 99 mg/dL   CT HEAD WO CONTRAST  Result Date: 04/29/2020 CLINICAL DATA:  Initial evaluation for postoperative aphasia status post tumor resection. EXAM: CT HEAD WITHOUT CONTRAST TECHNIQUE: Contiguous axial images were obtained from the base of the skull through the vertex without intravenous contrast. COMPARISON:  Prior CT and MRI from 04/28/2020. FINDINGS: Brain: Postoperative changes from interval craniectomy with cranioplasty at the skull vertex for resection of previously identified parafalcine meningioma. Small amount of blood products seen within the resection cavity at the parasagittal left frontal lobe. Scattered foci of pneumocephalus seen along the superior sagittal sinus as well as overlying both frontal convexities. Residual vasogenic edema within the parasagittal left posterior frontal region is relatively similar to preoperative exam. Possible trace edema at the contralateral parasagittal right frontal region as well. No visible complication evident by CT. No other acute intracranial hemorrhage. No other visible acute large vessel territory infarct. No midline shift or hydrocephalus. No extra-axial fluid collection. Foci of chronic encephalomalacia overlying the anterior left frontotemporal convexities noted, likely related to remote trauma, stable. Vascular: No hyperdense vessel. Skull: Postoperative changes at the skull vertex. Swelling with soft tissue emphysema noted within the overlying scalp. No adverse features. Sinuses/Orbits: Globes and orbital soft tissues within normal limits. Mild scattered mucosal thickening noted within the paranasal sinuses. No mastoid effusion. Other: None. IMPRESSION: 1. Postoperative changes from interval craniectomy with cranioplasty at the skull vertex for resection of parafalcine  meningioma. Small amount of blood products within the resection cavity without visible complication. 2. Residual vasogenic edema within the parasagittal left posterior frontal region, relatively similar to preoperative exam. 3. No visible evidence for acute intracranial infarct or other abnormality. Electronically Signed   By: Jeannine Boga M.D.   On: 04/29/2020 23:11   MR BRAIN W WO CONTRAST  Result Date: 05/01/2020 CLINICAL DATA:  Follow-up examination status post tumor resection. EXAM: MRI HEAD WITHOUT AND WITH CONTRAST TECHNIQUE: Multiplanar, multiecho pulse sequences of the brain and surrounding structures were obtained without and with intravenous contrast. CONTRAST:  110mL GADAVIST GADOBUTROL 1 MMOL/ML IV SOLN COMPARISON:  Prior preoperative brain MRI from 04/28/2020. FINDINGS: Brain: Postoperative changes from recent biparietal craniotomy and cranioplasty at the vertex. Previously seen parafalcine meningioma has largely been resected. Scattered postoperative blood products seen throughout the resection cavity. Patchy restricted diffusion seen within the parasagittal regions at the site of tumor resection, left greater than right. This likely reflects ischemic changes within the cingulate gyri bilaterally, previously compressed by the meningioma. This may reflect venous infarction as the superior sagittal sinus has been partially sacrificed and dissected at the site of tumor resection. Small 6 mm nodular focus of enhancement at  the level of the posterosuperior sagittal sinus, at the posterior margin of the resection cavity noted, likely a small amount of residual tumor (series 19, image 7). No other significant residual tumor identified. Residual vasogenic edema within the adjacent left greater than right cerebral hemispheres without significant regional mass effect or midline shift. No other acute intracranial abnormality elsewhere within the brain. Small amount of postoperative pneumocephalus  again noted overlying the anterior frontal convexities. Underlying mild chronic small vessel ischemic disease. Chronic posttraumatic encephalomalacia at the anterior left frontotemporal convexities noted. No other mass lesion or abnormal enhancement. No hydrocephalus or extra-axial fluid collection. Vascular: Major intracranial vascular flow voids are maintained. The superior sagittal sinus appears patent both anteriorly and posteriorly to the tumor resection site. Remainder of the major dural sinuses appear patent. Skull and upper cervical spine: Craniocervical junction within normal limits. Bone marrow signal intensity normal. Recent biparietal craniectomy with cranioplasty without adverse features. Persistent multifocal scalp soft tissue swelling. Sinuses/Orbits: Globes and orbital soft tissues within normal limits. Paranasal sinuses remain largely clear. No mastoid effusion. Other: None. IMPRESSION: 1. Postoperative changes from recent biparietal craniectomy with cranioplasty at the vertex. Previously seen parafalcine meningioma has largely been resected. Small 6 mm nodular focus of enhancement at the level of the posterosuperior sagittal sinus, likely a small amount of residual tumor. No other significant residual tumor identified. 2. Restricted diffusion involving the left greater than right posterior parafalcine cerebral hemispheres at site of resection, suggesting ischemia. This may reflect venous infarctions given that the superior sagittal sinus has been partially resected at this level. 3. No other new acute intracranial abnormality. Electronically Signed   By: Jeannine Boga M.D.   On: 05/01/2020 04:06     Assessment/Plan: Diagnosis: Bilateral frontal mengiomas s/p left craniectomy with tumor resection 04/29/20. 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 Does the need for close, 24 hr/day medical supervision in concert with the patient's rehab needs make it unreasonable for  this patient to be served in a less intensive setting? Yes 2. Co-Morbidities requiring supervision/potential complications: HTN 3. Due to bladder management, bowel management, safety, skin/wound care, disease management, medication administration, pain management and patient education, does the patient require 24 hr/day rehab nursing? Yes 4. Does the patient require coordinated care of a physician, rehab nurse, therapy disciplines of PT, OT, SLP to address physical and functional deficits in the context of the above medical diagnosis(es)? Yes Addressing deficits in the following areas: balance, endurance, locomotion, strength, transferring, bowel/bladder control, bathing, dressing, feeding, grooming, toileting, cognition, speech, language, swallowing and psychosocial support 5. Can the patient actively participate in an intensive therapy program of at least 3 hrs of therapy per day at least 5 days per week? Yes 6. The potential for patient to make measurable gains while on inpatient rehab is good 7. Anticipated functional outcomes upon discharge from inpatient rehab are mod assist to max assist  with PT, mod assist to max assist with OT, supervision and min assist with SLP. 8. Estimated rehab length of stay to reach the above functional goals is: 3-4 weeks 9. Anticipated discharge destination: Home 10. Overall Rehab/Functional Prognosis: good  RECOMMENDATIONS: This patient's condition is appropriate for continued rehabilitative care in the following setting: CIR Patient has agreed to participate in recommended program. Yes Note that insurance prior authorization may be required for reimbursement for recommended care.  Comment: Spoke with wife at length. 1 level home with 2 STE. Will likely need ramp. W/C level goals. Rehab Admissions Coordinator to follow  up.  Thanks,  [name], MD, [facility]  I have personally performed a face to face diagnostic evaluation of this patient. Additionally,  I have examined pertinent labs and radiographic images. I have reviewed and concur with the physician assistant's documentation above.    Lavon Paganini Angiulli, PA-C 05/01/2020

## 2020-05-01 NOTE — Progress Notes (Signed)
Physical Therapy Treatment Patient Details Name: Anthony Downs MRN: [id number] DOB: 1950-03-16 Today's Date: 05/01/2020    History of Present Illness 70 yo male presents 4/11 with progressive RLE > RUE weakness over the past few months. MRI revealed convexity meningioma, L>R with surrounding vasogenic edema in the left posterior frontal lobe. 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 PMH arthritis asthma skin Ca GERD HTN Sleep apnea SDH s/p fall 2008    PT Comments    Pt demonstrating increased ease with breaking his strong extensor tone in sidelying and improved expressive language this date. Pt expressed that he understands the PT's cues but cannot get his body to do as he wants right now. Due to his strong extensor tone, decided to transfer pt to recliner via use of maxi-sky for safety purposes with legs placed in flexion prior to lift. Attempted sit to stand from recliner, but unsuccessful with TAx2, thus may want to attempt use of stedy from recliner in future sessions. Pt placed sitting upright with feet flat on floor, using sock grips to keep feet in place. Educated pt and his wife on continuing to attempt to utilize R UE and flex his hips and knees. Will continue to follow acutely. Current recommendations remain appropriate.    Follow Up Recommendations  CIR     Equipment Recommendations  3in1 (PT);Wheelchair (measurements PT);Wheelchair cushion (measurements PT);Hospital bed;Other (comment) (mechanical lift; may change as pt progresses)    Recommendations for Other Services       Precautions / Restrictions Precautions Precautions: Fall Precaution Comments: Strong extensor tone Restrictions Weight Bearing Restrictions: No    Mobility  Bed Mobility Overal bed mobility: Needs Assistance Bed Mobility: Rolling Rolling: Total assist;+2 for physical assistance;+2 for  safety/equipment;Max assist         General bed mobility comments: Improved breaking of extensor tone in sidelying this date compared to prior session. Pt needing TAx2 to roll to L. Positioned L leg in flexion and cued pt to reach L arm across for PT's hand on R to assist with roll to R, maxAx2.    Transfers Overall transfer level: Needs assistance Equipment used: 2 person hand held assist (maxi-sky) Transfers: Sit to/from Stand Sit to Stand: Total assist;+2 physical assistance;+2 safety/equipment         General transfer comment: Pt rolled onto maxisky pad in bed and dependently placed his hips and knees in flexion while supine prior to start of use of maxisky to lift pt off bed to chair dependently. From recliner, attempted sit to stand x3 trials, but unable to clear buttocks with TAx2 and cues to push up from arm rests.  Ambulation/Gait             General Gait Details: not safe to test at this time   Stairs             Wheelchair Mobility    Modified Rankin (Stroke Patients Only) Modified Rankin (Stroke Patients Only) Pre-Morbid Rankin Score: No significant disability Modified Rankin: Severe disability     Balance Overall balance assessment: Needs assistance     Sitting balance - Comments: Pt needing Max-TA to lean anteriorly in chair.       Standing balance comment: not safe to test at this time                            Cognition  Arousal/Alertness: Awake/alert Behavior During Therapy: Flat affect Overall Cognitive Status: Difficult to assess Area of Impairment: Following commands;Awareness                       Following Commands: Follows one step commands inconsistently;Follows one step commands with increased time   Awareness: Emergent   General Comments: pt requires 10-15 second delay to respond to questions. Pt with improved coversation, expressing his thoughts better this date. Pt aware and reports understanding of  cues to flex his legs and trunk but that he just cannot get his body to do what he wants it to.      Exercises      General Comments General comments (skin integrity, edema, etc.): Educated pt and his wife on continuing to try to perform seated marching, R hand squeezes, R arm lift to mouth, and bil HS curls with feet on towel/pillow case      Pertinent Vitals/Pain Pain Assessment: Faces Faces Pain Scale: Hurts a little bit Pain Location: generalized with mobility Pain Descriptors / Indicators: Discomfort;Grimacing;Guarding Pain Intervention(s): Limited activity within patient's tolerance;Monitored during session;Repositioned    Home Living                      Prior Function            PT Goals (current goals can now be found in the care plan section) Acute Rehab PT Goals Patient Stated Goal: to improve PT Goal Formulation: With patient/family Time For Goal Achievement: 05/14/20 Potential to Achieve Goals: Good Progress towards PT goals: Progressing toward goals    Frequency    Min 4X/week      PT Plan Current plan remains appropriate    Co-evaluation              AM-PAC PT "6 Clicks" Mobility   Outcome Measure  Help needed turning from your back to your side while in a flat bed without using bedrails?: Total Help needed moving from lying on your back to sitting on the side of a flat bed without using bedrails?: Total Help needed moving to and from a bed to a chair (including a wheelchair)?: Total Help needed standing up from a chair using your arms (e.g., wheelchair or bedside chair)?: Total Help needed to walk in hospital room?: Total Help needed climbing 3-5 steps with a railing? : Total 6 Click Score: 6    End of Session   Activity Tolerance: Other (comment) (limited by extensor tone) Patient left: with call bell/phone within reach;with family/visitor present;in chair;with chair alarm set (Pt positioned upright in chair with hips flexed and  knees flexed, using sock grips to keep feet flat with knees flexed and ankles dorisflexed) Nurse Communication: Mobility status;Need for lift equipment PT Visit Diagnosis: Unsteadiness on feet (R26.81);Muscle weakness (generalized) (M62.81);Difficulty in walking, not elsewhere classified (R26.2);Other symptoms and signs involving the nervous system (R29.898)     Time: 7412-8786 PT Time Calculation (min) (ACUTE ONLY): 29 min  Charges:  $Therapeutic Activity: 23-37 mins                     Moishe Spice, PT, DPT Acute Rehabilitation Services  Pager: [phone] Office: [place] 05/01/2020, 5:23 PM

## 2020-05-01 NOTE — Progress Notes (Signed)
Pt transferred to 4NP05 from 4NICU. Report received from TK, RN. Pt brought to new room with 2 family members, no additional belongings at this time. Full assessment completed and vital signs documented and WNL.   [name] Rocher, RN

## 2020-05-01 NOTE — Discharge Summary (Signed)
Discharge Summary  Date of Admission: 04/28/2020  Date of Discharge: 05/02/20  Attending Physician: Emelda Brothers, MD  Hospital Course: Patient presented to the ED with progressive right sided weakness. He had and MRI that showed a large bilateral meningioma and was therefore sent to the ED. A preop CTV showed that the sinus was occluded at the level of the tumor. He was taken to the OR on 4/12 for craniotomy and resection. Post-op MRI showed a gross total resection except for some possible residual tumor in the patent portion of the sinus. The tumor was very difficult to dissect free and appeared to be invasive. Post-operatively, he had a left SMA syndrome with aphasia, right sided weakness, and left lower extremity weakness all with preserved tone. His aphasia improved and strength slowly improved. He was seen by PT/OT/SLP who recommended CIR placement. His hospital course was uncomplicated and the patient was discharged to CIR on 05/02/20. He will follow up in clinic with me in 2 weeks. Final pathology was still pending at the time of discharge.   Discharge diagnosis: Brain tumor  Judith Part, MD 05/01/20 6:24 PM

## 2020-05-02 ENCOUNTER — Encounter (HOSPITAL_COMMUNITY): Payer: Self-pay | Admitting: Physical Medicine & Rehabilitation

## 2020-05-02 ENCOUNTER — Inpatient Hospital Stay (HOSPITAL_COMMUNITY)
Admission: RE | Admit: 2020-05-02 | Discharge: 2020-06-17 | DRG: 052 | Disposition: A | Discharge status: Home Health | Payer: Medicare Other | Source: Intra-hospital | Attending: Physical Medicine & Rehabilitation | Admitting: Physical Medicine & Rehabilitation

## 2020-05-02 ENCOUNTER — Other Ambulatory Visit: Payer: Self-pay

## 2020-05-02 DIAGNOSIS — I82442 Acute embolism and thrombosis of left tibial vein: Secondary | ICD-10-CM | POA: Diagnosis present

## 2020-05-02 DIAGNOSIS — R482 Apraxia: Secondary | ICD-10-CM | POA: Diagnosis present

## 2020-05-02 DIAGNOSIS — M25511 Pain in right shoulder: Secondary | ICD-10-CM | POA: Diagnosis not present

## 2020-05-02 DIAGNOSIS — N390 Urinary tract infection, site not specified: Secondary | ICD-10-CM | POA: Diagnosis not present

## 2020-05-02 DIAGNOSIS — E876 Hypokalemia: Secondary | ICD-10-CM | POA: Diagnosis not present

## 2020-05-02 DIAGNOSIS — R410 Disorientation, unspecified: Secondary | ICD-10-CM | POA: Diagnosis not present

## 2020-05-02 DIAGNOSIS — G8250 Quadriplegia, unspecified: Secondary | ICD-10-CM | POA: Diagnosis present

## 2020-05-02 DIAGNOSIS — E785 Hyperlipidemia, unspecified: Secondary | ICD-10-CM | POA: Diagnosis present

## 2020-05-02 DIAGNOSIS — L299 Pruritus, unspecified: Secondary | ICD-10-CM | POA: Diagnosis present

## 2020-05-02 DIAGNOSIS — R5383 Other fatigue: Secondary | ICD-10-CM | POA: Diagnosis not present

## 2020-05-02 DIAGNOSIS — Z87820 Personal history of traumatic brain injury: Secondary | ICD-10-CM

## 2020-05-02 DIAGNOSIS — Z72820 Sleep deprivation: Secondary | ICD-10-CM | POA: Diagnosis not present

## 2020-05-02 DIAGNOSIS — R451 Restlessness and agitation: Secondary | ICD-10-CM | POA: Diagnosis not present

## 2020-05-02 DIAGNOSIS — D496 Neoplasm of unspecified behavior of brain: Secondary | ICD-10-CM | POA: Diagnosis present

## 2020-05-02 DIAGNOSIS — Z7982 Long term (current) use of aspirin: Secondary | ICD-10-CM

## 2020-05-02 DIAGNOSIS — R350 Frequency of micturition: Secondary | ICD-10-CM | POA: Diagnosis present

## 2020-05-02 DIAGNOSIS — R41841 Cognitive communication deficit: Secondary | ICD-10-CM | POA: Diagnosis present

## 2020-05-02 DIAGNOSIS — K219 Gastro-esophageal reflux disease without esophagitis: Secondary | ICD-10-CM | POA: Diagnosis present

## 2020-05-02 DIAGNOSIS — D72829 Elevated white blood cell count, unspecified: Secondary | ICD-10-CM | POA: Diagnosis present

## 2020-05-02 DIAGNOSIS — Z79899 Other long term (current) drug therapy: Secondary | ICD-10-CM

## 2020-05-02 DIAGNOSIS — D329 Benign neoplasm of meninges, unspecified: Secondary | ICD-10-CM | POA: Diagnosis not present

## 2020-05-02 DIAGNOSIS — N401 Enlarged prostate with lower urinary tract symptoms: Secondary | ICD-10-CM | POA: Diagnosis present

## 2020-05-02 DIAGNOSIS — I10 Essential (primary) hypertension: Secondary | ICD-10-CM | POA: Diagnosis present

## 2020-05-02 DIAGNOSIS — G8918 Other acute postprocedural pain: Secondary | ICD-10-CM | POA: Diagnosis present

## 2020-05-02 DIAGNOSIS — R251 Tremor, unspecified: Secondary | ICD-10-CM | POA: Diagnosis present

## 2020-05-02 DIAGNOSIS — T380X5A Adverse effect of glucocorticoids and synthetic analogues, initial encounter: Secondary | ICD-10-CM | POA: Diagnosis present

## 2020-05-02 DIAGNOSIS — B961 Klebsiella pneumoniae [K. pneumoniae] as the cause of diseases classified elsewhere: Secondary | ICD-10-CM | POA: Diagnosis not present

## 2020-05-02 DIAGNOSIS — K5901 Slow transit constipation: Secondary | ICD-10-CM | POA: Diagnosis present

## 2020-05-02 DIAGNOSIS — M7989 Other specified soft tissue disorders: Secondary | ICD-10-CM | POA: Diagnosis not present

## 2020-05-02 DIAGNOSIS — M25512 Pain in left shoulder: Secondary | ICD-10-CM | POA: Diagnosis not present

## 2020-05-02 DIAGNOSIS — Z85828 Personal history of other malignant neoplasm of skin: Secondary | ICD-10-CM

## 2020-05-02 DIAGNOSIS — Z86011 Personal history of benign neoplasm of the brain: Secondary | ICD-10-CM

## 2020-05-02 DIAGNOSIS — R3915 Urgency of urination: Secondary | ICD-10-CM | POA: Diagnosis present

## 2020-05-02 LAB — CBC
HCT: 33.6 % — ABNORMAL LOW (ref 39.0–52.0)
Hemoglobin: 11 g/dL — ABNORMAL LOW (ref 13.0–17.0)
MCH: 31 pg (ref 26.0–34.0)
MCHC: 32.7 g/dL (ref 30.0–36.0)
MCV: 94.6 fL (ref 80.0–100.0)
Platelets: 196 10*3/uL (ref 150–400)
RBC: 3.55 MIL/uL — ABNORMAL LOW (ref 4.22–5.81)
RDW: 12.8 % (ref 11.5–15.5)
WBC: 9.9 10*3/uL (ref 4.0–10.5)
nRBC: 0 % (ref 0.0–0.2)

## 2020-05-02 LAB — GLUCOSE, CAPILLARY: Glucose-Capillary: 158 mg/dL — ABNORMAL HIGH (ref 70–99)

## 2020-05-02 LAB — CREATININE, SERUM
Creatinine, Ser: 0.91 mg/dL (ref 0.61–1.24)
GFR, Estimated: >60 mL/min (ref >60)

## 2020-05-02 LAB — SURGICAL PATHOLOGY

## 2020-05-02 MED ORDER — LORATADINE 10 MG PO TABS
10.0000 mg | ORAL_TABLET | Freq: Every day | ORAL | Status: DC
  Administered 2020-05-03 – 2020-05-19 (×17): 10 mg via ORAL
  Filled 2020-05-02 (×17): qty 1

## 2020-05-02 MED ORDER — AMLODIPINE BESYLATE 10 MG PO TABS
10.0000 mg | ORAL_TABLET | Freq: Every day | ORAL | Status: DC
  Administered 2020-05-03: 10 mg via ORAL
  Filled 2020-05-02: qty 1

## 2020-05-02 MED ORDER — ACETAMINOPHEN 325 MG PO TABS
650.0000 mg | ORAL_TABLET | ORAL | Status: DC | PRN
  Administered 2020-05-03 – 2020-06-17 (×24): 650 mg via ORAL
  Filled 2020-05-02 (×24): qty 2

## 2020-05-02 MED ORDER — FUROSEMIDE 20 MG PO TABS
20.0000 mg | ORAL_TABLET | Freq: Every day | ORAL | Status: DC
  Administered 2020-05-03 – 2020-05-18 (×16): 20 mg via ORAL
  Filled 2020-05-02 (×16): qty 1

## 2020-05-02 MED ORDER — HEPARIN SODIUM (PORCINE) 5000 UNIT/ML IJ SOLN
5000.0000 [IU] | Freq: Three times a day (TID) | INTRAMUSCULAR | Status: DC
  Administered 2020-05-02 – 2020-05-04 (×5): 5000 [IU] via SUBCUTANEOUS
  Filled 2020-05-02 (×6): qty 1

## 2020-05-02 MED ORDER — HEPARIN SODIUM (PORCINE) 5000 UNIT/ML IJ SOLN: 5000.0000 [IU] | Freq: Three times a day (TID) | INTRAMUSCULAR | Status: DC

## 2020-05-02 MED ORDER — POLYVINYL ALCOHOL 1.4 % OP SOLN
1.0000 [drp] | Freq: Every day | OPHTHALMIC | Status: DC | PRN
  Filled 2020-05-02: qty 15

## 2020-05-02 MED ORDER — ACETAMINOPHEN 650 MG RE SUPP: 650.0000 mg | RECTAL | Status: DC | PRN

## 2020-05-02 MED ORDER — ATORVASTATIN CALCIUM 10 MG PO TABS
10.0000 mg | ORAL_TABLET | Freq: Every day | ORAL | Status: DC
  Administered 2020-05-02 – 2020-06-16 (×46): 10 mg via ORAL
  Filled 2020-05-02 (×47): qty 1

## 2020-05-02 MED ORDER — HYDROCODONE-ACETAMINOPHEN 5-325 MG PO TABS: 1.0000 | ORAL_TABLET | ORAL | Status: DC | PRN

## 2020-05-02 MED ORDER — ASPIRIN EC 81 MG PO TBEC: 81.0000 mg | DELAYED_RELEASE_TABLET | Freq: Every day | ORAL | 11 refills | Status: DC

## 2020-05-02 MED ORDER — POLYETHYLENE GLYCOL 3350 17 G PO PACK
17.0000 g | PACK | Freq: Every day | ORAL | Status: DC | PRN
  Administered 2020-05-03 – 2020-05-04 (×2): 17 g via ORAL
  Filled 2020-05-02: qty 1

## 2020-05-02 MED ORDER — DOCUSATE SODIUM 100 MG PO CAPS
100.0000 mg | ORAL_CAPSULE | Freq: Two times a day (BID) | ORAL | Status: DC
  Administered 2020-05-02 – 2020-05-09 (×13): 100 mg via ORAL
  Filled 2020-05-02 (×13): qty 1

## 2020-05-02 MED ORDER — PANTOPRAZOLE SODIUM 40 MG PO TBEC
40.0000 mg | DELAYED_RELEASE_TABLET | Freq: Every day | ORAL | Status: DC
  Administered 2020-05-03 – 2020-06-17 (×46): 40 mg via ORAL
  Filled 2020-05-02 (×46): qty 1

## 2020-05-02 MED ORDER — ONDANSETRON HCL 4 MG/2ML IJ SOLN
4.0000 mg | INTRAMUSCULAR | Status: DC | PRN
Start: 2020-05-02 — End: 2020-06-17

## 2020-05-02 MED ORDER — ONDANSETRON HCL 4 MG PO TABS: 4.0000 mg | ORAL_TABLET | ORAL | Status: DC | PRN

## 2020-05-02 MED ORDER — DEXAMETHASONE 4 MG PO TABS
4.0000 mg | ORAL_TABLET | Freq: Two times a day (BID) | ORAL | Status: DC
  Administered 2020-05-02 – 2020-05-06 (×8): 4 mg via ORAL
  Filled 2020-05-02 (×8): qty 1

## 2020-05-02 NOTE — Progress Notes (Signed)
Physical Therapy Treatment Patient Details Name: Anthony Downs MRN: [id number] DOB: 1950/04/21 Today's Date: 05/02/2020    History of Present Illness 70 yo male presents 4/11 with progressive RLE > RUE weakness over the past few months. MRI revealed convexity meningioma, L>R with surrounding vasogenic edema in the left posterior frontal lobe. 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 PMH arthritis asthma skin Ca GERD HTN Sleep apnea SDH s/p fall 2008    PT Comments    Pt continuing to make good progress towards goals through demonstrating increased ease with flexing legs, primarily the L in supine and especially in sidelying. However, he continues to display a strong extensor tone, limiting transitioning pt from sidelying > sitting with TAx2, even when legs were already flexed and blocked. Thus, pt pulled into long-sitting then pivoted on buttocks to transition to sit EOB with TAx2. Pt with improved balance needing only min guard-A for static sitting with feet flat on floor and L hand supported on thigh. Facilitated antagonist trunk muscular contraction through cuing pt to perform controlled anterior <> posterior trunk leans sitting EOB, noted success, see "Sitting balance - Comments" below for details. Will continue to follow acutely. Current recommendations remain appropriate.    Follow Up Recommendations  CIR     Equipment Recommendations  3in1 (PT);Wheelchair (measurements PT);Wheelchair cushion (measurements PT);Hospital bed;Other (comment) (mechanical lift; may change as pt progresses)    Recommendations for Other Services       Precautions / Restrictions Precautions Precautions: Fall Precaution Comments: Strong extensor tone Restrictions Weight Bearing Restrictions: No    Mobility  Bed Mobility Overal bed mobility: Needs Assistance Bed Mobility: Rolling;Supine to Sit;Sit to  Supine Rolling: Total assist;+2 for physical assistance;+2 for safety/equipment;Max assist   Supine to sit: Total assist;+2 for physical assistance;+2 for safety/equipment;HOB elevated Sit to supine: Total assist;+2 for physical assistance;+2 for safety/equipment   General bed mobility comments: Improved breaking of extensor tone in sidelying this date compared to prior sessions. Positioned L leg in flexion and cued pt to reach L arm across for bed rail to assist with roll to R, maxAx2. Attempted sidelying to sit from R EOB once bil legs were positioned and blocked into hip and knee flexion but unable to lift trunk with TAx2. Thus, changed approach to dependently flexing pt's trunk from supine, having PT position self posterior to pt to block trunk from extending and to pivot pt on his buttocks to sit up EOB with rehab tech managing legs and positioning them into flexion once sitting EOB. Return to supine with TAx2, again with PT posterior to pt's trunk to control its descent and pivot and rehab tech managing legs.    Transfers                 General transfer comment: not safe to test at this time  Ambulation/Gait             General Gait Details: not safe to test at this time   Stairs             Wheelchair Mobility    Modified Rankin (Stroke Patients Only) Modified Rankin (Stroke Patients Only) Pre-Morbid Rankin Score: No significant disability Modified Rankin: Severe disability     Balance Overall balance assessment: Needs assistance Sitting-balance support: Feet supported;Single extremity supported;Bilateral upper extremity supported Sitting balance-Leahy Scale: Poor Sitting balance - Comments: Pt's legs dependently placed into flexion with feet blocked to  maintain feet flat on floor by rehab tech. PT posterior to pt, cuing pt to bring bil elbows to thighs to encourage anterior lean and trunk flexion, success. Pt with R lean, able to hold static balance when at  least L hand supported on thigh with min guard for brief periods of time then needed minA to prevent R lateral LOB. Cues provided to perform anterior <> posterior trunk leans to encourage antagonist muscle activation and control, x5 reps each direction with pt responding well to cues of "lay back in bed" to lean posteriorly and "pull/bring her to you" while holding rehab tech's hand with his L while rehab tech was anterior to pt. Extra time to process and initiate each, but improved response time with each rep. Postural control: Right lateral lean     Standing balance comment: not safe to test at this time                            Cognition Arousal/Alertness: Awake/alert Behavior During Therapy: Flat affect Overall Cognitive Status: Difficult to assess Area of Impairment: Following commands;Awareness                       Following Commands: Follows one step commands inconsistently;Follows one step commands with increased time   Awareness: Emergent   General Comments: pt requires 10-15 second delay to respond to questions. Pt with improved coversation, expressing his thoughts better this date. Pt aware and reports understanding of cues to flex his legs and trunk but that he just cannot get his body to do what he wants it to.      Exercises Other Exercises Other Exercises: anterior <> posterior trunk leans with tactile cues at muscles to facilitate contraction, see "Sitting balance - Comments" for details.    General Comments        Pertinent Vitals/Pain Pain Assessment: No/denies pain Pain Intervention(s): Monitored during session    Home Living                      Prior Function            PT Goals (current goals can now be found in the care plan section) Acute Rehab PT Goals Patient Stated Goal: to improve PT Goal Formulation: With patient/family Time For Goal Achievement: 05/14/20 Potential to Achieve Goals: Good Progress towards PT  goals: Progressing toward goals    Frequency    Min 4X/week      PT Plan Current plan remains appropriate    Co-evaluation              AM-PAC PT "6 Clicks" Mobility   Outcome Measure  Help needed turning from your back to your side while in a flat bed without using bedrails?: Total Help needed moving from lying on your back to sitting on the side of a flat bed without using bedrails?: Total Help needed moving to and from a bed to a chair (including a wheelchair)?: Total Help needed standing up from a chair using your arms (e.g., wheelchair or bedside chair)?: Total Help needed to walk in hospital room?: Total Help needed climbing 3-5 steps with a railing? : Total 6 Click Score: 6    End of Session   Activity Tolerance: Patient tolerated treatment well Patient left: in bed;with call bell/phone within reach;with bed alarm set;with family/visitor present   PT Visit Diagnosis: Unsteadiness on feet (R26.81);Muscle weakness (generalized) (M62.81);Difficulty  in walking, not elsewhere classified (R26.2);Other symptoms and signs involving the nervous system (R29.898)     Time: 8177-1165 PT Time Calculation (min) (ACUTE ONLY): 34 min  Charges:  $Therapeutic Activity: 8-22 mins $Neuromuscular Re-education: 8-22 mins                     Moishe Spice, PT, DPT Acute Rehabilitation Services  Pager: [phone] Office: [place] 05/02/2020, 3:53 PM

## 2020-05-02 NOTE — Progress Notes (Signed)
IP rehab admissions - I met with patient and his wife yesterday.  Wife is agreeable to inpatient rehab admission.  I spoke with Dr. Zada Finders this am and I have medical clearance for acute inpatient rehab admission for today.  I will plan to admit to CIR today.  Call me for questions.  [phone]

## 2020-05-02 NOTE — Progress Notes (Signed)
Pt discharged to 4W18 from 4NP05. Pt's wife at bedside during transfer. No personal items at the bedside. Report was called to [place], [place].   [name] Rocher, RN

## 2020-05-02 NOTE — Progress Notes (Signed)
[name], MD  Physician  Physical Medicine and Rehabilitation  Consult Note    Signed  Date of Service:  05/01/2020 5:36 AM      Related encounter: ED to Hosp-Admission (Current) from 04/28/2020 in [place]       Signed      Expand All Collapse All         Physical Medicine and Rehabilitation Consult Reason for Consult: Progressive right lower extremity right upper extremity weakness Referring Physician: Dr. Venetia Constable   HPI: Anthony Downs is a 70 y.o. right-handed male with history of hypertension, hyperlipidemia, subdural hematoma post traumatic fell off a truck striking his head on concrete 2008.  Per chart review lives with spouse.  1 level home with 2 steps to entry.  Independent prior to admission.  Independent prior to admission as well as independent ADLs.  Presented 04/28/2020 with progressive right lower extremity greater than right upper extremity weakness over the past few months.  He had a baseline slowly progressive tremor in the right upper extremity.  Denied any seizure.  MRI imaging revealed convexity meningioma left greater than right with surrounding vasogenic edema in the left posterior frontal lobe.  Patient underwent left craniectomy with tumor excision 04/29/2020 per Dr. Venetia Constable.  Maintained on Decadron protocol.  Tolerating a regular diet.  Therapy evaluations completed due to patient's progressive weakness recommendations of physical medicine rehab consult.   Review of Systems  Constitutional: Negative for chills and fever.  HENT: Negative for hearing loss.   Eyes: Negative for blurred vision and double vision.  Respiratory: Negative for cough and shortness of breath.   Cardiovascular: Negative for chest pain, palpitations and leg swelling.  Gastrointestinal: Positive for constipation. Negative for heartburn, nausea and vomiting.       GERD  Genitourinary: Negative for dysuria and hematuria.   Musculoskeletal: Positive for myalgias.  Skin: Negative for rash.  Neurological: Positive for tremors, weakness and headaches.  All other systems reviewed and are negative.      Past Medical History:  Diagnosis Date  . Arthritis   . Asthma    as a child  . 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 Complication of anesthesia    slow to wake up and pain medicines make him sick  . GERD (gastroesophageal reflux disease)   . Hypertension   . PONV (postoperative nausea and vomiting)   . Sleep apnea    no Cpap  . Subdural hematoma, post-traumatic ([place]) 2008   fell off truck hit head on concrete        Past Surgical History:  Procedure Laterality Date  . arthroscopic knee    . COLONOSCOPY N/A 11/29/2018   Procedure: COLONOSCOPY;  Surgeon: Rogene Houston, MD;  Location[facility];  Service: Endoscopy;  Laterality: N/A;  730-rescheduled 9/2 same time per Lelon Frohlich  . NASAL SEPTOPLASTY W/ TURBINOPLASTY Bilateral 03/19/2020   Procedure: NASAL SEPTOPLASTY WITH BILATERAL  TURBINATE REDUCTION;  Surgeon: Leta Baptist, MD;  Location: [facility];  Service: ENT;  Laterality: Bilateral;  . VASECTOMY          Family History  Problem Relation Age of Onset  . Breast cancer Mother   . Aortic aneurysm Father    Social History:  reports that he has never smoked. He has never used smokeless tobacco. He reports that he does not drink alcohol and does not use drugs. Allergies: No Known Allergies  Medications Prior to Admission  Medication Sig Dispense Refill  . aspirin EC 81 MG tablet Take 81 mg by mouth at bedtime. Swallow whole.    Marland Kitchen atorvastatin (LIPITOR) 10 MG tablet Take 10 mg by mouth at bedtime.     . cetirizine (ZYRTEC) 10 MG tablet Take 10 mg by mouth daily as needed for allergies.    Marland Kitchen docusate sodium (COLACE) 250 MG capsule Take 500 mg by mouth 2 (two) times daily.    . furosemide (LASIX) 20 MG tablet Take 20 mg by mouth daily.     . pantoprazole (PROTONIX) 40 MG tablet Take 40 mg by mouth daily.    Marland Kitchen Propylene Glycol-Glycerin (SOOTHE OP) Apply 1 drop to eye daily as needed (dry eyes).    . tamsulosin (FLOMAX) 0.4 MG CAPS capsule Take 0.4 mg by mouth at bedtime.    Marland Kitchen ZINC-VITAMIN C PO Take 1 tablet by mouth daily.      Home: Home Living Family/patient expects to be discharged to:: Private residence Living Arrangements: Spouse/significant other Available Help at Discharge: Family,Available 24 hours/day Type of Home: House Home Access: Stairs to enter [employer] of Steps: 2 (large steps from garage) Entrance Stairs-Rails: Left (ascending, with wall on R) Home Layout: One level Bathroom Shower/Tub: Walk-in shower Home Equipment: Shower seat Additional Comments: has a Haematologist History: Prior Function Level of Independence: Independent Comments: Pt likes to do yard work. Pt is independent with all ADLs and functional mobility at baseline. Functional Status:  Mobility: Bed Mobility Overal bed mobility: Needs Assistance Bed Mobility: Rolling,Supine to Sit,Sit to Supine Rolling: Total assist,+2 for physical assistance,+2 for safety/equipment Supine to sit: Total assist,+2 for physical assistance,+2 for safety/equipment,HOB elevated Sit to supine: Total assist,+2 for physical assistance,+2 for safety/equipment General bed mobility comments: pt placed in side lying to help with checking extensor tone, noted success in breaking extensor tone in sidelying compared to other positions. pt with HOB elevated to help with supine to sit due to extensor tone. pt high risk to slide off EOB. pt requires BLOCKING and then repositioning bil legs to prevent sliding off EOB while sitting EOB. pt total (A) posterior extension. Head used to help with flexion initiation, but unable to sustain. Transfers General transfer comment: not safe to test at this time Ambulation/Gait General Gait Details: not  safe to test at this time  ADL: ADL Overall ADL's : Needs assistance/impaired Eating/Feeding: Maximal assistance Grooming: Modified independent Upper Body Bathing: Total assistance Lower Body Bathing: Total assistance Lower Body Dressing: Total assistance General ADL Comments: pt was unable to lift bil LE to help with socks  Cognition: Cognition Overall Cognitive Status: Difficult to assess Orientation Level: Oriented X4 Cognition Arousal/Alertness: Awake/alert Behavior During Therapy: Flat affect Overall Cognitive Status: Difficult to assess Area of Impairment: Following commands,Awareness Following Commands: Follows one step commands inconsistently,Follows one step commands with increased time Awareness: Emergent General Comments: pt requires 10-15 second delay to respond to questions. pt able to state name, wife name, that person in room is wife, location hospital, head surgery. pt attempting to don underwear and able to express embarrassment for reason to wear bottoms. Pt became emotional a couple times during session. Difficult to assess due to: Impaired communication  Blood pressure (!) 146/65, pulse 71, temperature 97.8 F (36.6 C), temperature source Oral, resp. rate 15, height 6' (1.829 m), weight (!) 137.9 kg, SpO2 96 %. Physical Exam HENT:     Head:     Comments: Craniectomy site clean and  dry    Nose: Nose normal.  Eyes:     Extraocular Movements: Extraocular movements intact.     Conjunctiva/sclera: Conjunctivae normal.     Pupils: Pupils are equal, round, and reactive to light.  Cardiovascular:     Rate and Rhythm: Normal rate.     Pulses: Normal pulses.  Pulmonary:     Effort: Pulmonary effort is normal.  Abdominal:     Palpations: Abdomen is soft.  Musculoskeletal:        General: Swelling present.     Cervical back: Normal range of motion.  Skin:    General: Skin is warm.  Neurological:     Mental Status: He is alert.     Comments: Alert but  delayed. Follows simple commands. Able to provide basic biographical information. Slow to process but answered questions with slow, measure speech. LUE 2-3/5 prox to distal. RUE tr-0/5. BLE 0/5. Senses pain in all 4's. + extensor tone in LE's. DTR's 3+ all 4's.   Psychiatric:     Comments: Flat but cooperative     Lab Results Last 24 Hours       Results for orders placed or performed during the hospital encounter of 04/28/20 (from the past 24 hour(s))  Glucose, capillary     Status: Abnormal   Collection Time: 04/30/20  6:20 AM  Result Value Ref Range   Glucose-Capillary 140 (H) 70 - 99 mg/dL      Imaging Results (Last 48 hours)  CT HEAD WO CONTRAST  Result Date: 04/29/2020 CLINICAL DATA:  Initial evaluation for postoperative aphasia status post tumor resection. EXAM: CT HEAD WITHOUT CONTRAST TECHNIQUE: Contiguous axial images were obtained from the base of the skull through the vertex without intravenous contrast. COMPARISON:  Prior CT and MRI from 04/28/2020. FINDINGS: Brain: Postoperative changes from interval craniectomy with cranioplasty at the skull vertex for resection of previously identified parafalcine meningioma. Small amount of blood products seen within the resection cavity at the parasagittal left frontal lobe. Scattered foci of pneumocephalus seen along the superior sagittal sinus as well as overlying both frontal convexities. Residual vasogenic edema within the parasagittal left posterior frontal region is relatively similar to preoperative exam. Possible trace edema at the contralateral parasagittal right frontal region as well. No visible complication evident by CT. No other acute intracranial hemorrhage. No other visible acute large vessel territory infarct. No midline shift or hydrocephalus. No extra-axial fluid collection. Foci of chronic encephalomalacia overlying the anterior left frontotemporal convexities noted, likely related to remote trauma, stable. Vascular: No  hyperdense vessel. Skull: Postoperative changes at the skull vertex. Swelling with soft tissue emphysema noted within the overlying scalp. No adverse features. Sinuses/Orbits: Globes and orbital soft tissues within normal limits. Mild scattered mucosal thickening noted within the paranasal sinuses. No mastoid effusion. Other: None. IMPRESSION: 1. Postoperative changes from interval craniectomy with cranioplasty at the skull vertex for resection of parafalcine meningioma. Small amount of blood products within the resection cavity without visible complication. 2. Residual vasogenic edema within the parasagittal left posterior frontal region, relatively similar to preoperative exam. 3. No visible evidence for acute intracranial infarct or other abnormality. Electronically Signed   By: Jeannine Boga M.D.   On: 04/29/2020 23:11   MR BRAIN W WO CONTRAST  Result Date: 05/01/2020 CLINICAL DATA:  Follow-up examination status post tumor resection. EXAM: MRI HEAD WITHOUT AND WITH CONTRAST TECHNIQUE: Multiplanar, multiecho pulse sequences of the brain and surrounding structures were obtained without and with intravenous contrast. CONTRAST:  24mL GADAVIST  GADOBUTROL 1 MMOL/ML IV SOLN COMPARISON:  Prior preoperative brain MRI from 04/28/2020. FINDINGS: Brain: Postoperative changes from recent biparietal craniotomy and cranioplasty at the vertex. Previously seen parafalcine meningioma has largely been resected. Scattered postoperative blood products seen throughout the resection cavity. Patchy restricted diffusion seen within the parasagittal regions at the site of tumor resection, left greater than right. This likely reflects ischemic changes within the cingulate gyri bilaterally, previously compressed by the meningioma. This may reflect venous infarction as the superior sagittal sinus has been partially sacrificed and dissected at the site of tumor resection. Small 6 mm nodular focus of enhancement at the level of  the posterosuperior sagittal sinus, at the posterior margin of the resection cavity noted, likely a small amount of residual tumor (series 19, image 7). No other significant residual tumor identified. Residual vasogenic edema within the adjacent left greater than right cerebral hemispheres without significant regional mass effect or midline shift. No other acute intracranial abnormality elsewhere within the brain. Small amount of postoperative pneumocephalus again noted overlying the anterior frontal convexities. Underlying mild chronic small vessel ischemic disease. Chronic posttraumatic encephalomalacia at the anterior left frontotemporal convexities noted. No other mass lesion or abnormal enhancement. No hydrocephalus or extra-axial fluid collection. Vascular: Major intracranial vascular flow voids are maintained. The superior sagittal sinus appears patent both anteriorly and posteriorly to the tumor resection site. Remainder of the major dural sinuses appear patent. Skull and upper cervical spine: Craniocervical junction within normal limits. Bone marrow signal intensity normal. Recent biparietal craniectomy with cranioplasty without adverse features. Persistent multifocal scalp soft tissue swelling. Sinuses/Orbits: Globes and orbital soft tissues within normal limits. Paranasal sinuses remain largely clear. No mastoid effusion. Other: None. IMPRESSION: 1. Postoperative changes from recent biparietal craniectomy with cranioplasty at the vertex. Previously seen parafalcine meningioma has largely been resected. Small 6 mm nodular focus of enhancement at the level of the posterosuperior sagittal sinus, likely a small amount of residual tumor. No other significant residual tumor identified. 2. Restricted diffusion involving the left greater than right posterior parafalcine cerebral hemispheres at site of resection, suggesting ischemia. This may reflect venous infarctions given that the superior sagittal sinus has  been partially resected at this level. 3. No other new acute intracranial abnormality. Electronically Signed   By: Jeannine Boga M.D.   On: 05/01/2020 04:06      Assessment/Plan: Diagnosis: Bilateral frontal mengiomas s/p left craniectomy with tumor resection 04/29/20. 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 Does the need for close, 24 hr/day medical supervision in concert with the patient's rehab needs make it unreasonable for this patient to be served in a less intensive setting? Yes 2. Co-Morbidities requiring supervision/potential complications: HTN 3. Due to bladder management, bowel management, safety, skin/wound care, disease management, medication administration, pain management and patient education, does the patient require 24 hr/day rehab nursing? Yes 4. Does the patient require coordinated care of a physician, rehab nurse, therapy disciplines of PT, OT, SLP to address physical and functional deficits in the context of the above medical diagnosis(es)? Yes Addressing deficits in the following areas: balance, endurance, locomotion, strength, transferring, bowel/bladder control, bathing, dressing, feeding, grooming, toileting, cognition, speech, language, swallowing and psychosocial support 5. Can the patient actively participate in an intensive therapy program of at least 3 hrs of therapy per day at least 5 days per week? Yes 6. The potential for patient to make measurable gains while on inpatient rehab is good 7. Anticipated functional outcomes upon discharge from inpatient rehab are mod assist  to max assist  with PT, mod assist to max assist with OT, supervision and min assist with SLP. 8. Estimated rehab length of stay to reach the above functional goals is: 3-4 weeks 9. Anticipated discharge destination: Home 10. Overall Rehab/Functional Prognosis: good  RECOMMENDATIONS: This patient's condition is appropriate for continued rehabilitative care in the  following setting: CIR Patient has agreed to participate in recommended program. Yes Note that insurance prior authorization may be required for reimbursement for recommended care.  Comment: Spoke with wife at length. 1 level home with 2 STE. Will likely need ramp. W/C level goals. Rehab Admissions Coordinator to follow up.  Thanks,  [name], MD, Mellody Drown  I have personally performed a face to face diagnostic evaluation of this patient. Additionally, I have examined pertinent labs and radiographic images. I have reviewed and concur with the physician assistant's documentation above.    Cathlyn Parsons, PA-C 05/01/2020          Revision History                        Routing History              Note Details  Author [name], MD File Time 05/01/2020 12:38 PM  Author Type Physician Status Signed  Last Editor [name], MD Service Physical Medicine and Rehabilitation
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
## 2020-05-02 NOTE — H&P (Incomplete)
Physical Medicine and Rehabilitation Admission H&P    Chief Complaint  Patient presents with  . Extremity Weakness  : HPI: Anthony Downs is a 70 year old right-handed male with history of hypertension, sleep apnea no CPAP, hyperlipidemia, subdural hematoma post traumatic fall from a truck striking his head on concrete 2008.  Per chart review lives with spouse.  1 level home 2 steps to entry.  Independent prior to admission.  Presented 04/28/2020 with progressive right lower extremity greater than right upper extremity weakness over the past few months.  He had a baseline slowly progressive tremor in the right upper extremity.  Denied any seizure.  MRI imaging revealed convexity meningioma left greater than right with surrounding vasogenic edema in the left posterior frontal lobe.  Patient underwent left craniectomy with tumor excision 04/29/2020 per Dr. Venetia Constable.  Maintained on Decadron protocol.  He was cleared to begin subcutaneous heparin for DVT prophylaxis for 14 2022.  Tolerating a regular diet.  Therapy evaluations completed due to patient's progressive weakness recommendations physical medicine rehab consult and patient was admitted for a comprehensive rehab program.  Review of Systems  Constitutional: Negative for chills and fever.  HENT: Negative for hearing loss.   Eyes: Negative for blurred vision and double vision.  Respiratory: Negative for cough and shortness of breath.   Cardiovascular: Negative for chest pain, palpitations and leg swelling.  Gastrointestinal: Positive for constipation. Negative for heartburn, nausea and vomiting.       GERD  Genitourinary: Negative for dysuria, flank pain and hematuria.  Musculoskeletal: Positive for myalgias.  Skin: Negative for rash.  Neurological: Positive for tremors, weakness and headaches.  All other systems reviewed and are negative.  Past Medical History:  Diagnosis Date  . Arthritis   . Asthma    as a child  . 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 Complication of anesthesia    slow to wake up and pain medicines make him sick  . GERD (gastroesophageal reflux disease)   . Hypertension   . PONV (postoperative nausea and vomiting)   . Sleep apnea    no Cpap  . Subdural hematoma, post-traumatic ([place]) 2008   fell off truck hit head on concrete   Past Surgical History:  Procedure Laterality Date  . APPLICATION OF CRANIAL NAVIGATION Left 04/29/2020   Procedure: APPLICATION OF CRANIAL NAVIGATION;  Surgeon: Judith Part, MD;  Location: [facility];  Service: Neurosurgery;  Laterality: Left;  . arthroscopic knee    . COLONOSCOPY N/A 11/29/2018   Procedure: COLONOSCOPY;  Surgeon: Rogene Houston, MD;  [facility];  Service: Endoscopy;  Laterality: N/A;  730-rescheduled 9/2 same time per Lelon Frohlich  . CRANIOTOMY Left 04/29/2020   Procedure: LEFT CRANIECTOMY WITH TUMOR EXCISION;  Surgeon: Judith Part, MD;  Location: [facility];  Service: Neurosurgery;  Laterality: Left;  . NASAL SEPTOPLASTY W/ TURBINOPLASTY Bilateral 03/19/2020   Procedure: NASAL SEPTOPLASTY WITH BILATERAL  TURBINATE REDUCTION;  Surgeon: Leta Baptist, MD;  Location: [facility];  Service: ENT;  Laterality: Bilateral;  . VASECTOMY     Family History  Problem Relation Age of Onset  . Breast cancer Mother   . Aortic aneurysm Father    Social History:  reports that he has never smoked. He has never used smokeless tobacco. He reports that he does not drink alcohol and does not use drugs. Allergies: No Known Allergies Medications Prior to Admission  Medication Sig Dispense  Refill  . aspirin EC 81 MG tablet Take 81 mg by mouth at bedtime. Swallow whole.    Marland Kitchen atorvastatin (LIPITOR) 10 MG tablet Take 10 mg by mouth at bedtime.     . cetirizine (ZYRTEC) 10 MG tablet Take 10 mg by mouth daily as needed for allergies.    Marland Kitchen docusate sodium (COLACE) 250 MG capsule Take 500 mg by mouth 2 (two) times daily.    . furosemide  (LASIX) 20 MG tablet Take 20 mg by mouth daily.    . pantoprazole (PROTONIX) 40 MG tablet Take 40 mg by mouth daily.    Marland Kitchen Propylene Glycol-Glycerin (SOOTHE OP) Apply 1 drop to eye daily as needed (dry eyes).    . tamsulosin (FLOMAX) 0.4 MG CAPS capsule Take 0.4 mg by mouth at bedtime.    Marland Kitchen ZINC-VITAMIN C PO Take 1 tablet by mouth daily.      Drug Regimen Review Drug regimen was reviewed and remains appropriate with no significant issues identified  Home: Home Living Family/patient expects to be discharged to:: Private residence Living Arrangements: Spouse/significant other Available Help at Discharge: Family,Available 24 hours/day Type of Home: House Home Access: Stairs to enter [employer] of Steps: 2 (large steps from garage) Entrance Stairs-Rails: Left (ascending, with wall on R) Home Layout: One level Bathroom Shower/Tub: Walk-in shower Home Equipment: Shower seat Additional Comments: has a dog  Lives With: Spouse   Functional History: Prior Function Level of Independence: Independent Comments: Pt likes to do yard work. Pt is independent with all ADLs and functional mobility at baseline.  Functional Status:  Mobility: Bed Mobility Overal bed mobility: Needs Assistance Bed Mobility: Rolling Rolling: Total assist,+2 for physical assistance,+2 for safety/equipment,Max assist Supine to sit: Total assist,+2 for physical assistance,+2 for safety/equipment,HOB elevated Sit to supine: Total assist,+2 for physical assistance,+2 for safety/equipment General bed mobility comments: Improved breaking of extensor tone in sidelying this date compared to prior session. Pt needing TAx2 to roll to L. Positioned L leg in flexion and cued pt to reach L arm across for PT's hand on R to assist with roll to R, maxAx2. Transfers Overall transfer level: Needs assistance Equipment used: 2 person hand held assist Armed forces operational officer) Transfer via Lift Equipment: [employer] Transfers: Sit to/from  Stand Sit to Stand: Total assist,+2 physical assistance,+2 safety/equipment General transfer comment: Pt rolled onto maxisky pad in bed and dependently placed his hips and knees in flexion while supine prior to start of use of maxisky to lift pt off bed to chair dependently. From recliner, attempted sit to stand x3 trials, but unable to clear buttocks with TAx2 and cues to push up from arm rests. Ambulation/Gait General Gait Details: not safe to test at this time    ADL: ADL Overall ADL's : Needs assistance/impaired Eating/Feeding: Maximal assistance Grooming: Modified independent Upper Body Bathing: Total assistance Lower Body Bathing: Total assistance Lower Body Dressing: Total assistance General ADL Comments: pt was unable to lift bil LE to help with socks  Cognition: Cognition Overall Cognitive Status: Difficult to assess Arousal/Alertness: Awake/alert Orientation Level: Oriented X4 Attention: Sustained Sustained Attention: Appears intact Cognition Arousal/Alertness: Awake/alert Behavior During Therapy: Flat affect Overall Cognitive Status: Difficult to assess Area of Impairment: Following commands,Awareness Following Commands: Follows one step commands inconsistently,Follows one step commands with increased time Awareness: Emergent General Comments: pt requires 10-15 second delay to respond to questions. Pt with improved coversation, expressing his thoughts better this date. Pt aware and reports understanding of cues to flex his legs and trunk but  that he just cannot get his body to do what he wants it to. Difficult to assess due to: Impaired communication  Physical Exam: Blood pressure 131/64, pulse 60, temperature (!) 97.4 F (36.3 C), temperature source Oral, resp. rate 13, height 6' (1.829 m), weight (!) 137.9 kg, SpO2 94 %. Physical Exam HENT:     Head:     Comments: Craniotomy site clean and dry Neurological:     Comments: Patient is alert in no acute distress.   Makes eye contact with examiner.  Provides his name and age with some delay in processing.  He can provide biographical information.     No results found for this or any previous visit (from the past 48 hour(s)). MR BRAIN W WO CONTRAST  Result Date: 05/01/2020 CLINICAL DATA:  Follow-up examination status post tumor resection. EXAM: MRI HEAD WITHOUT AND WITH CONTRAST TECHNIQUE: Multiplanar, multiecho pulse sequences of the brain and surrounding structures were obtained without and with intravenous contrast. CONTRAST:  69mL GADAVIST GADOBUTROL 1 MMOL/ML IV SOLN COMPARISON:  Prior preoperative brain MRI from 04/28/2020. FINDINGS: Brain: Postoperative changes from recent biparietal craniotomy and cranioplasty at the vertex. Previously seen parafalcine meningioma has largely been resected. Scattered postoperative blood products seen throughout the resection cavity. Patchy restricted diffusion seen within the parasagittal regions at the site of tumor resection, left greater than right. This likely reflects ischemic changes within the cingulate gyri bilaterally, previously compressed by the meningioma. This may reflect venous infarction as the superior sagittal sinus has been partially sacrificed and dissected at the site of tumor resection. Small 6 mm nodular focus of enhancement at the level of the posterosuperior sagittal sinus, at the posterior margin of the resection cavity noted, likely a small amount of residual tumor (series 19, image 7). No other significant residual tumor identified. Residual vasogenic edema within the adjacent left greater than right cerebral hemispheres without significant regional mass effect or midline shift. No other acute intracranial abnormality elsewhere within the brain. Small amount of postoperative pneumocephalus again noted overlying the anterior frontal convexities. Underlying mild chronic small vessel ischemic disease. Chronic posttraumatic encephalomalacia at the anterior  left frontotemporal convexities noted. No other mass lesion or abnormal enhancement. No hydrocephalus or extra-axial fluid collection. Vascular: Major intracranial vascular flow voids are maintained. The superior sagittal sinus appears patent both anteriorly and posteriorly to the tumor resection site. Remainder of the major dural sinuses appear patent. Skull and upper cervical spine: Craniocervical junction within normal limits. Bone marrow signal intensity normal. Recent biparietal craniectomy with cranioplasty without adverse features. Persistent multifocal scalp soft tissue swelling. Sinuses/Orbits: Globes and orbital soft tissues within normal limits. Paranasal sinuses remain largely clear. No mastoid effusion. Other: None. IMPRESSION: 1. Postoperative changes from recent biparietal craniectomy with cranioplasty at the vertex. Previously seen parafalcine meningioma has largely been resected. Small 6 mm nodular focus of enhancement at the level of the posterosuperior sagittal sinus, likely a small amount of residual tumor. No other significant residual tumor identified. 2. Restricted diffusion involving the left greater than right posterior parafalcine cerebral hemispheres at site of resection, suggesting ischemia. This may reflect venous infarctions given that the superior sagittal sinus has been partially resected at this level. 3. No other new acute intracranial abnormality. Electronically Signed   By: Jeannine Boga M.D.   On: 05/01/2020 04:06       Medical Problem List and Plan: 1.  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  Status post tumor  resection 04/29/2020.  Decadron taper  -patient may *** shower  -ELOS/Goals: *** 2.  Antithrombotics: -DVT/anticoagulation: Subcutaneous heparin.  Check vascular study  -antiplatelet therapy: N/A 3. Pain Management: Hydrocodone as needed 4. Mood: Provide emotional  support  -antipsychotic agents: N/A 5. Neuropsych: This patient is capable of making decisions on his own behalf. 6. Skin/Wound Care: Routine skin checks 7. Fluids/Electrolytes/Nutrition: Routine in and outs with follow-up chemistries 8.  Hypertension.  Lasix 20 mg daily, Norvasc 10 mg daily.  Monitor with increased mobility 9.  Hyperlipidemia.  Lipitor   ***  Cathlyn Parsons, PA-C 05/02/2020

## 2020-05-02 NOTE — Progress Notes (Signed)
Inpatient Rehabilitation Medication Review by a Pharmacist  A complete drug regimen review was completed for this patient to identify any potential clinically significant medication issues.  Clinically significant medication issues were identified:  no  Check AMION for pharmacist assigned to patient if future medication questions/issues arise during this admission.  Pharmacist comments:   Time spent performing this drug regimen review (minutes):  5   [name] A. Levada Dy, PharmD, BCPS, FNKF Clinical Pharmacist [place] Please utilize Amion for appropriate phone number to reach the unit pharmacist ([place])  05/02/2020 7:15 PM

## 2020-05-02 NOTE — Care Management Important Message (Signed)
Important Message  Patient Details  Name: Anthony Downs MRN: [id number] Date of Birth: Jun 07, 1950   Medicare Important Message Given:  Yes - Important Message mailed due to current National Emergency   Verbal consent obtained due to current National Emergency   Contact Name: [name] Call Date: 05/02/20  Time: 1209 Phone: [phone] Outcome: Spoke with contact Important Message mailed to: Patient address on file    Delorse Lek 05/02/2020, 12:10 PM

## 2020-05-02 NOTE — Discharge Instructions (Signed)
Discharge Instructions  No restriction in activities, slowly increase your activity back to normal.   Your incision is closed with absorbable sutures. These will naturally fall off over the next 4-6 weeks. If they become bothersome or cause discomfort, apply some antibiotic ointment like bacitracin or neosporin on the sutures. This will soften them up and usually makes them more comfortable while they dissolve.  Okay to shower on the day of discharge. Be gentle when cleaning your incision. Use regular soap and water. If that is uncomfortable, try using baby shampoo. Do not submerge the wound under water for 2 weeks after surgery.  Follow up with Dr. Gerry Blanchfield in 2 weeks after discharge. If you do not already have a discharge appointment, please call his office at [phone] to schedule a follow up appointment. If you have any concerns or questions, please call the office and let us know. 

## 2020-05-02 NOTE — Progress Notes (Signed)
  Speech Language Pathology Treatment: Cognitive-Linquistic  Patient Details Name: Anthony Downs MRN: [id number] DOB: Jan 25, 1950 Today's Date: 05/02/2020 Time: 6962-9528 SLP Time Calculation (min) (ACUTE ONLY): 36.88 min  Assessment / Plan / Recommendation Clinical Impression  Pt continues with expressive aphasia.  Demonstrated improved divergent naming - able to generate five items/category without delay.  Perseverative responses intermittently evident -pt able to recognize them; requires visual/verbal cues to break perseveratory pattern and shift set.  Speech remains methodical, slow but with good syntactic production and no paraphasias.  Spent time talking with pt/wife about nature of aphasia, waxing/waning that can be dependent on other factors like fatigue, emotional state.  Anthony Downs had appropriate questions. Offered encouragement.  He is D/Cing to CIR today.   HPI HPI: 70 yo male presents 4/11 with progressive RLE > RUE weakness over the past few months. MRI revealed convexity meningioma, L>R with surrounding vasogenic edema in the left posterior frontal lobe. 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 PMH arthritis asthma skin Ca GERD HTN Sleep apnea SDH s/p fall 2008      SLP Plan  Continue with current plan of care       Recommendations   aphasia therapy in CIR                Oral Care Recommendations: Oral care BID Follow up Recommendations: Inpatient Rehab SLP Visit Diagnosis: Aphasia (R47.01) Plan: Continue with current plan of care       GO               Anthony Downs, [place] CCC/SLP Acute Rehabilitation Services Office number [phone] Pager [phone]  Anthony Downs 05/02/2020, 12:15 PM

## 2020-05-03 LAB — URINALYSIS, ROUTINE W REFLEX MICROSCOPIC
Bilirubin Urine: NEGATIVE
Glucose, UA: 50 mg/dL — AB
Hgb urine dipstick: NEGATIVE
Ketones, ur: NEGATIVE mg/dL
Leukocytes,Ua: NEGATIVE
Nitrite: NEGATIVE
Protein, ur: NEGATIVE mg/dL
Specific Gravity, Urine: 1.023 (ref 1.005–1.030)
pH: 6 (ref 5.0–8.0)

## 2020-05-03 MED ORDER — TAMSULOSIN HCL 0.4 MG PO CAPS
0.4000 mg | ORAL_CAPSULE | Freq: Every day | ORAL | Status: DC
  Administered 2020-05-03: 0.4 mg via ORAL
  Filled 2020-05-03: qty 1

## 2020-05-03 NOTE — Progress Notes (Signed)
Inpatient Rehabilitation  Patient information reviewed and entered into eRehab system by I[facility] M. Shaconda Hajduk, M.A., CCC/SLP, PPS Coordinator.  Information including medical coding, functional ability and quality indicators will be reviewed and updated through discharge.    

## 2020-05-03 NOTE — Progress Notes (Signed)
PROGRESS NOTE   Subjective/Complaints: Patient fatigued Wife mentions amlodipine causes leg swelling- replaced with tamsulosin HS which patient takes at home.  Patient very anxious about not being able to use urinal in time- advised regarding timed voiding  ROS: +urinary urgency   Objective:   No results found. Recent Labs    05/02/20 1826  WBC 9.9  HGB 11.0*  HCT 33.6*  PLT 196   Recent Labs    05/02/20 1826  CREATININE 0.91    Intake/Output Summary (Last 24 hours) at 05/03/2020 1542 Last data filed at 05/03/2020 1254 Gross per 24 hour  Intake 480 ml  Output 1500 ml  Net -1020 ml        Physical Exam: Vital Signs Blood pressure (!) 134/54, pulse 81, temperature 99.9 F (37.7 C), temperature source Oral, resp. rate 15, SpO2 96 %. Gen: no distress, normal appearing HEENT: oral mucosa pink and moist, NCAT Cardio: Reg rate Chest: normal effort, normal rate of breathing Abd: soft, non-distended Ext: bilateral lower extremity 1+ edema Psych: pleasant, normal affect Skin: No evidence of breakdown, no evidence of rash Neurologic: Cranial nerves II through XII intact, motor strength is 5/5 in Left and 0/5 right deltoid, bicep, tricep, grip,trace bilateral hip flexor, knee extensors, ankle dorsiflexor and plantar flexor Sensory exam normal sensation to light touch and proprioception in bilateral upper and lower extremities Cerebellar exam normal finger to nose to fingerLeft upper Musculoskeletal:limited ROM with knee flexion and hip flexion. No joint swelling Increased hip ext tone   Assessment/Plan: 1. Functional deficits which require 3+ hours per day of interdisciplinary therapy in a comprehensive inpatient rehab setting.  Physiatrist is providing close team supervision and 24 hour management of active medical problems listed below.  Physiatrist and rehab team continue to assess barriers to  discharge/monitor patient progress toward functional and medical goals  Care Tool:  Bathing              Bathing assist       Upper Body Dressing/Undressing Upper body dressing        Upper body assist      Lower Body Dressing/Undressing Lower body dressing            Lower body assist       Toileting Toileting    Toileting assist       Transfers Chair/bed transfer  Transfers assist           Locomotion Ambulation   Ambulation assist              Walk 10 feet activity   Assist           Walk 50 feet activity   Assist           Walk 150 feet activity   Assist           Walk 10 feet on uneven surface  activity   Assist           Wheelchair     Assist               Wheelchair 50 feet with 2 turns activity    Assist  Wheelchair 150 feet activity     Assist          Blood pressure (!) 134/54, pulse 81, temperature 99.9 F (37.7 C), temperature source Oral, resp. rate 15, SpO2 96 %.    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 Decadron taper -patient may  Shower with cap -ELOS/Goals: 3-4 wks minA goals 2. Antithrombotics: -DVT/anticoagulation:Subcutaneous heparin. Check vascular study -antiplatelet therapy: N/A 3. Post-operative pain:Well controlled. Discussing d/cing Hydrocodone and wife and patient agreeable. Tylenol PRN for pain.  4. Mood:Provide emotional support -antipsychotic agents: N/A 5. Neuropsych: This patientiscapable of making decisions on hisown behalf. 6. Skin/Wound Care:Routine skin checks 7. Fluids/Electrolytes/Nutrition:Routine in and outs with follow-up chemistries 8. Hypertension. Lasix 20 mg daily, Norvasc 10 mg  daily. Monitor with increased mobility  4/16: as per wife, was recommended for Norvasc to be d/ced given leg swelling. D/ced accordingly. Restarted home Flomax HS which will also help with BP 9. Hyperlipidemia. Lipitor 10. BPH: restart Flomax 0.4mg  HS 11. Urinary frequency: Ordered UA/UC. Recommended timed voiding himself q1H while awake with urinal. Flomax as above. Wear depends for a few days if he would like as anxious about urinating on himself. Discussed with nursing.   LOS: 1 days A FACE TO FACE EVALUATION WAS PERFORMED  Anthony Downs 05/03/2020, 3:42 PM

## 2020-05-03 NOTE — Evaluation (Signed)
Speech Language Pathology Assessment and Plan  Patient Details  Name: Anthony Downs MRN: [id number] Date of Birth: 04/24/1950  SLP Diagnosis: Aphasia;Cognitive Impairments  Rehab Potential: Excellent ELOS: 6 weeks - likely won't need ST services for entire duration of stay  Today's Date: 05/03/2020 SLP Individual Time: 1105-1200 SLP Individual Time Calculation (min): 55 min  Hospital Problem: Principal Problem:   Brain tumor [facility]) Active Problems:   Meningioma [facility])  Past Medical History:  Past Medical History:  Diagnosis Date  . Arthritis   . Asthma    as a child  . 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 Complication of anesthesia    slow to wake up and pain medicines make him sick  . GERD (gastroesophageal reflux disease)   . Hypertension   . PONV (postoperative nausea and vomiting)   . Sleep apnea    no Cpap  . Subdural hematoma, post-traumatic ([place]) 2008   fell off truck hit head on concrete   Past Surgical History:  Past Surgical History:  Procedure Laterality Date  . APPLICATION OF CRANIAL NAVIGATION Left 04/29/2020   Procedure: APPLICATION OF CRANIAL NAVIGATION;  Surgeon: Judith Part, MD;  Location: [place];  Service: Neurosurgery;  Laterality: Left;  . arthroscopic knee    . COLONOSCOPY N/A 11/29/2018   Procedure: COLONOSCOPY;  Surgeon: Rogene Houston, MD;  [facility];  Service: Endoscopy;  Laterality: N/A;  730-rescheduled 9/2 same time per Lelon Frohlich  . CRANIOTOMY Left 04/29/2020   Procedure: LEFT CRANIECTOMY WITH TUMOR EXCISION;  Surgeon: Judith Part, MD;  Location: [place];  Service: Neurosurgery;  Laterality: Left;  . NASAL SEPTOPLASTY W/ TURBINOPLASTY Bilateral 03/19/2020   Procedure: NASAL SEPTOPLASTY WITH BILATERAL  TURBINATE REDUCTION;  Surgeon: Leta Baptist, MD;  Location: [place];  Service: ENT;  Laterality: Bilateral;  . VASECTOMY      Assessment / Plan / Recommendation Clinical Impression Patient  is a 70 y.o. right-handed male with history of hypertension, sleep apnea no CPAP, hyperlipidemia, subdural hematoma post traumatic fall from a truck striking his head on concrete 2008. Per chart review lives with spouse. 1 level home 2 steps to entry. Independent prior to admission. Presented 04/28/2020 with progressive right lower extremity greater than right upper extremity weakness over the past few months. He had a baseline slowly progressive tremor in the right upper extremity. Denied any seizure. MRI imaging revealed convexity meningioma left greater than right with surrounding vasogenic edema in the left posterior frontal lobe. Patient underwent left craniectomy with tumor excision 04/29/2020 per Dr. Venetia Constable. Maintained on Decadron protocol. He was cleared to begin subcutaneous heparin for DVT prophylaxis for 14 2022. Tolerating a regular diet. Therapy evaluations completed due to patient's progressive weakness recommendations physical medicine rehab consult and patient was admitted for a comprehensive rehab program. Patient transferred to CIR on 05/02/2020 .  SLP administering SLUMS examination with a patient score of 26/30 (WFL = 27+). Pt deficits in verbal fluency and short term recall of 5 unrelated words (pt able to recall 2/5). Pt wife states pt has had a decline in memory s/p surgery, patient noted to be A&O x 4, able to detail events of past week and demonstrates good insight into deficits at this time. Clock drawing WFL with use of nondominant left hand. Pt speech characterized by slow and methodical, however no perseverations or paraphasias evident throughout. Pt able to ID communication breakdown ~80% of occurrences, takes time to  correct before moving on. Divergent naming average 10 items/minute this date, improved from acute stay. Both patient and wife endorse significant improvement in speech/language in recent days. Patient would benefit from skilled SLP intervention to maximize  his cognitive functioning and overall functional independence prior to discharge.    Skilled Therapeutic Interventions          Pt participating in [place] Mental Status Examination and other informal cognitive communication probes. Please see above for detail.   SLP Assessment  Patient will need skilled [place] Pathology Services during CIR admission    Recommendations  Recommendations for Other Services: Neuropsych consult Patient destination: Home Follow up Recommendations: Outpatient SLP Equipment Recommended: To be determined    SLP Frequency 3 to 5 out of 7 days   SLP Duration  SLP Intensity  SLP Treatment/Interventions 6 weeks  Minumum of 1-2 x/day, 30 to 90 minutes  Cognitive remediation/compensation;Therapeutic Activities;Therapeutic Exercise;Internal/external aids;Medication managment;Patient/family education;Cueing hierarchy;Functional tasks    Pain Pain Assessment Pain Scale: 0-10 Pain Score: 0-No pain  Prior Functioning Cognitive/Linguistic Baseline: Within functional limits Type of Home: House  Lives With: Spouse Available Help at Discharge: Family;Available 24 hours/day Vocation: Retired  Programmer, systems Overall Cognitive Status: Impaired/Different from baseline Arousal/Alertness: Awake/alert Orientation Level: Oriented X4 Attention: Sustained Sustained Attention: Appears intact Memory: Impaired Memory Impairment: Decreased short term memory Decreased Short Term Memory: Verbal basic;Functional basic Safety/Judgment: Impaired  Comprehension Auditory Comprehension Overall Auditory Comprehension: Appears within functional limits for tasks assessed Yes/No Questions: Within Functional Limits Commands: Within Functional Limits Conversation: Simple Visual Recognition/Discrimination Discrimination: Within Function Limits Reading Comprehension Reading Status: Not tested Expression Expression Primary Mode of Expression:  Verbal Verbal Expression Overall Verbal Expression: Impaired Initiation: No impairment Level of Generative/Spontaneous Verbalization: Conversation Repetition: No impairment Naming: No impairment Confrontation: Within functional limits Divergent: 50-74% accurate Pragmatics: No impairment Written Expression Dominant Hand: Right Written Expression: Exceptions to [facility] Oral Motor Oral Motor/Sensory Function Overall Oral Motor/Sensory Function: Within functional limits Motor Speech Overall Motor Speech: Appears within functional limits for tasks assessed  Care Tool Care Tool Cognition Expression of Ideas and Wants Expression of Ideas and Wants: Some difficulty - exhibits some difficulty with expressing needs and ideas (e.g, some words or finishing thoughts) or speech is not clear   Understanding Verbal and Non-Verbal Content Understanding Verbal and Non-Verbal Content: Usually understands - understands most conversations, but misses some part/intent of message. Requires cues at times to understand   Memory/Recall Ability *first 3 days only Memory/Recall Ability *first 3 days only: Current season;Location of own room;Staff names and faces;That he or she is in a hospital/hospital unit    Short Term Goals: Week 1: SLP Short Term Goal 1 (Week 1): Pt will recall novel/functional information with and without delay provided Min A verbal cues SLP Short Term Goal 2 (Week 1): Pt will complete mildly complex problem solving and executive function tasks provided min A verbal cues SLP Short Term Goal 3 (Week 1): Pt will participate in verbal fluency tasks (divergent naming, etc) with min A verbal cues SLP Short Term Goal 4 (Week 1): Pt will understand, recall and utilize strategies to reduce frustration and improve communication at simple conversation level provided min A verbal cues  Refer to Care Plan for Long Term Goals  Recommendations for other services: Neuropsych  Discharge Criteria: Patient  will be discharged from SLP if patient refuses treatment 3 consecutive times without medical reason, if treatment goals not met, if there is a change in medical status,  if patient makes no progress towards goals or if patient is discharged from hospital.  The above assessment, treatment plan, treatment alternatives and goals were discussed and mutually agreed upon: by patient  Dewaine Conger 05/03/2020, 12:44 PM

## 2020-05-03 NOTE — Evaluation (Signed)
Physical Therapy Assessment and Plan  Patient Details  Name: Anthony Downs MRN: [id number] Date of Birth: Nov 13, 1950  PT Diagnosis: Abnormal posture, Abnormality of gait, Cognitive deficits, Difficulty walking, Edema, Hemiplegia dominant, Hypertonia, Impaired sensation, Muscle weakness and Paraplegia Rehab Potential: Good ELOS: ~ 6 weeks   Today's Date: 05/03/2020 PT Individual Time: 1010-1105 PT Individual Time Calculation (min): 55 min    Hospital Problem: Principal Problem:   Brain tumor [facility]) Active Problems:   Meningioma [facility])   Past Medical History:  Past Medical History:  Diagnosis Date  . Arthritis   . Asthma    as a child  . 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 Complication of anesthesia    slow to wake up and pain medicines make him sick  . GERD (gastroesophageal reflux disease)   . Hypertension   . PONV (postoperative nausea and vomiting)   . Sleep apnea    no Cpap  . Subdural hematoma, post-traumatic ([place]) 2008   fell off truck hit head on concrete   Past Surgical History:  Past Surgical History:  Procedure Laterality Date  . APPLICATION OF CRANIAL NAVIGATION Left 04/29/2020   Procedure: APPLICATION OF CRANIAL NAVIGATION;  Surgeon: Judith Part, MD;  Location: [place];  Service: Neurosurgery;  Laterality: Left;  . arthroscopic knee    . COLONOSCOPY N/A 11/29/2018   Procedure: COLONOSCOPY;  Surgeon: Rogene Houston, MD;  [facility];  Service: Endoscopy;  Laterality: N/A;  730-rescheduled 9/2 same time per Lelon Frohlich  . CRANIOTOMY Left 04/29/2020   Procedure: LEFT CRANIECTOMY WITH TUMOR EXCISION;  Surgeon: Judith Part, MD;  Location: [place];  Service: Neurosurgery;  Laterality: Left;  . NASAL SEPTOPLASTY W/ TURBINOPLASTY Bilateral 03/19/2020   Procedure: NASAL SEPTOPLASTY WITH BILATERAL  TURBINATE REDUCTION;  Surgeon: Leta Baptist, MD;  Location: [place];  Service: ENT;  Laterality: Bilateral;  . VASECTOMY       Assessment & Plan Clinical Impression: Patient is a 70 y.o. right-handed male with history of hypertension, sleep apnea no CPAP, hyperlipidemia, subdural hematoma post traumatic fall from a truck striking his head on concrete 2008. Per chart review lives with spouse. 1 level home 2 steps to entry. Independent prior to admission. Presented 04/28/2020 with progressive right lower extremity greater than right upper extremity weakness over the past few months. He had a baseline slowly progressive tremor in the right upper extremity. Denied any seizure. MRI imaging revealed convexity meningioma left greater than right with surrounding vasogenic edema in the left posterior frontal lobe. Patient underwent left craniectomy with tumor excision 04/29/2020 per Dr. Venetia Constable. Maintained on Decadron protocol. He was cleared to begin subcutaneous heparin for DVT prophylaxis for 14 2022. Tolerating a regular diet. Therapy evaluations completed due to patient's progressive weakness recommendations physical medicine rehab consult and patient was admitted for a comprehensive rehab program. Patient transferred to CIR on 05/02/2020 .   Patient currently requires +2 total assist with mobility secondary to muscle weakness, muscle joint tightness and muscle paralysis, decreased cardiorespiratoy endurance, impaired timing and sequencing, abnormal tone, unbalanced muscle activation and decreased motor planning, decreased midline orientation, decreased initiation, decreased attention, decreased awareness, decreased problem solving and delayed processing and decreased sitting balance, decreased standing balance, decreased postural control, hemiplegia and decreased balance strategies.  Prior to hospitalization, patient was independent  with mobility and lived with Spouse in a House home.  Home access is 2 (garage)Stairs to enter.  Patient will  benefit from skilled PT intervention to maximize safe functional mobility,  minimize fall risk and decrease caregiver burden for planned discharge home with 24 hour assist.  Anticipate patient will benefit from follow up [facility] at discharge.  PT - End of Session Activity Tolerance: Tolerates 30+ min activity with multiple rests Endurance Deficit: Yes PT Assessment Rehab Potential (ACUTE/IP ONLY): Good PT Barriers to Discharge: Inaccessible home environment PT Patient demonstrates impairments in the following area(s): Balance;Perception;Behavior;Safety;Edema;Sensory;Endurance;Skin Integrity;Motor;Nutrition;Pain PT Transfers Functional Problem(s): Bed Mobility;Bed to Chair;Car;Furniture PT Locomotion Functional Problem(s): Ambulation;Wheelchair Mobility;Stairs PT Plan PT Intensity: Minimum of 1-2 x/day ,45 to 90 minutes PT Frequency: 5 out of 7 days PT Duration Estimated Length of Stay: ~ 6 weeks PT Treatment/Interventions: Ambulation/gait training;Community reintegration;DME/adaptive equipment instruction;Neuromuscular re-education;Psychosocial support;Stair training;UE/LE Strength taining/ROM;Wheelchair propulsion/positioning;Balance/vestibular training;Functional electrical stimulation;Discharge planning;Pain management;Skin care/wound management;Therapeutic Activities;UE/LE Coordination activities;Cognitive remediation/compensation;Disease management/prevention;Functional mobility training;Patient/family education;Splinting/orthotics;Therapeutic Exercise;Visual/perceptual remediation/compensation PT Transfers Anticipated Outcome(s): Mod assist with LRAD PT Locomotion Anticipated Outcome(s): Max assist with LRAD PT Recommendation Recommendations for Other Services: Neuropsych consult;Therapeutic Recreation consult Therapeutic Recreation Interventions: Stress management Follow Up Recommendations: Home health PT;24 hour supervision/assistance Patient destination: Home Equipment Recommended: To be determined   PT  Evaluation Precautions/Restrictions Precautions Precautions: Fall;Other (comment) Precaution Comments: Strong BLE extensor tone, hx of R hand tremor Restrictions Weight Bearing Restrictions: No Pain Pain Assessment Pain Scale: 0-10 Pain Score: 0-No pain (pt would vocalize "owe" but when asked if he was experiencing pain pt would state "no" - inquired multiple times throughout session) Home Living/Prior Functioning Home Living Available Help at Discharge: Family;Available 24 hours/day (wife, [name]) Type of Home: House Home Access: Stairs to enter [employer] of Steps: 2 (garage) Entrance Stairs-Rails: Left Home Layout: One level  Lives With: Spouse Prior Function Level of Independence: Independent with homemaking with ambulation;Independent with gait;Independent with transfers  Able to Take Stairs?: Yes Driving: Yes 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 Pt is independent with all ADLs and functional mobility at baseline. Perception  Perception Perception: Impaired Spatial Orientation: impaired midline orientation Praxis Praxis: Impaired Praxis Impairment Details: Initiation;Motor planning  Cognition Overall Cognitive Status: Impaired/Different from baseline Arousal/Alertness: Awake/alert Orientation Level: Oriented to person;Oriented to situation;Disoriented to place;Oriented to time (initially states he is in the ER but able to self correct (believe this was due to word retrieval error)) Attention: Focused Focused Attention: Appears intact Safety/Judgment: Impaired Sensation Sensation Light Touch: Impaired Detail Central sensation comments: unable to sense light touch in either LE - able to sense deep pressure Light Touch Impaired Details: Impaired LLE;Impaired RLE Hot/Cold: Not tested Proprioception: Impaired Detail Proprioception Impaired Details: Impaired RLE;Impaired LLE Stereognosis: Not  tested Coordination Gross Motor Movements are Fluid and Coordinated: No Coordination and Movement Description: no active movement noted in either LE, limited movement in R UE, impaired trunk control Heel Shin Test: unable to due paresis and extensor tone Motor  Motor Motor: Abnormal tone;Hemiplegia;Clonus;Abnormal postural alignment and control;Motor apraxia;Paraplegia Motor - Skilled Clinical Observations: Significant extensor tone RLE>LLE, hx of R UE tremor, paresis in B LEs with no active movement  Trunk/Postural Assessment  Cervical Assessment Cervical Assessment: Exceptions to [facility] (cervical protraction) Thoracic Assessment Thoracic Assessment: Exceptions to [facility] (rounded shoulders) Lumbar Assessment Lumbar Assessment: Exceptions to [facility] (posterior pelvic tilt in sitting) Postural Control Postural Control: Deficits on evaluation Trunk Control: impaired - requires min assist for static sitting balance Righting Reactions: significantly impaired in sitting - lack of awareness when loosing balance and no initiaiton of righting reactions Protective Responses: significantly impaired  in sitting - no protective response noted when having minor LOB  Balance Balance Balance Assessed: Yes Static Sitting Balance Static Sitting - Balance Support: Feet supported;Bilateral upper extremity supported Static Sitting - Level of Assistance: 4: Min assist Dynamic Sitting Balance Dynamic Sitting - Balance Support: Feet unsupported;Bilateral upper extremity supported Dynamic Sitting - Level of Assistance: 2: Max assist;1: +1 Total assist Extremity Assessment      RLE Assessment RLE Assessment: Exceptions to [facility] Passive Range of Motion (PROM) Comments: limited ankle DF ROM in supine due to extensor tone, WFL hip/knee flexion once max assist provided to break tone General Strength Comments: no active movement noted in R LE - poor motor planning as well so that could have contributed RLE Tone RLE Tone:  Severe;Hypertonic Hypertonic Details: severe extensor tone requiring heavy max assist to passively move into hip/knee flexion while in supine - did not notice extensor tone in sitting LLE Assessment LLE Assessment: Exceptions to [facility] Passive Range of Motion (PROM) Comments: limited ankle DF ROM in supine due to extensor tone, WFL hip/knee flexion once max assist provided to break tone General Strength Comments: no active movement noted in R LE - poor motor planning as well so that could have contributed LLE Tone LLE Tone: Severe;Hypertonic Hypertonic Details: severe extensor tone requiring heavy max assist to passively move into hip/knee flexion while in supine - did not notice extensor tone in sitting  Care Tool Care Tool Bed Mobility Roll left and right activity   Roll left and right assist level: 2 Helpers    Sit to lying activity   Sit to lying assist level: 2 Helpers    Lying to sitting edge of bed activity   Lying to sitting edge of bed assist level: 2 Helpers     Care Tool Transfers Sit to stand transfer Sit to stand activity did not occur: Safety/medical concerns      Chair/bed transfer Chair/bed transfer activity did not occur: Safety/medical concerns       Toilet transfer Toilet transfer activity did not occur: Safety/medical concerns      Scientist, product/process development transfer activity did not occur: Safety/medical concerns        Care Tool Locomotion Ambulation Ambulation activity did not occur: Safety/medical concerns        Walk 10 feet activity Walk 10 feet activity did not occur: Safety/medical concerns       Walk 50 feet with 2 turns activity Walk 50 feet with 2 turns activity did not occur: Safety/medical concerns      Walk 150 feet activity Walk 150 feet activity did not occur: Safety/medical concerns      Walk 10 feet on uneven surfaces activity Walk 10 feet on uneven surfaces activity did not occur: Safety/medical concerns      Stairs Stair activity did  not occur: Safety/medical concerns        Walk up/down 1 step activity Walk up/down 1 step or curb (drop down) activity did not occur: Safety/medical concerns     Walk up/down 4 steps activity did not occuR: Safety/medical concerns  Walk up/down 4 steps activity      Walk up/down 12 steps activity Walk up/down 12 steps activity did not occur: Safety/medical concerns      Pick up small objects from floor Pick up small object from the floor (from standing position) activity did not occur: Safety/medical concerns      Wheelchair Will patient use wheelchair at discharge?: Yes (TBD but likely)  Wheel 50 feet with 2 turns activity      Wheel 150 feet activity        Refer to Care Plan for Long Term Goals  SHORT TERM GOAL WEEK 1 PT Short Term Goal 1 (Week 1): Pt will perform R/L rolling in bed with +2 mod assist using bed features PT Short Term Goal 2 (Week 1): Pt will tolerate sitting EOB for at least 15 minutes during therapy PT Short Term Goal 3 (Week 1): Pt will maintain dynamic sitting balance EOM with max assist for at least 3 minutes PT Short Term Goal 4 (Week 1): Pt will perform supine<>sitting with +2 max assist  Recommendations for other services: Therapeutic Recreation  Stress management  Skilled Therapeutic Intervention Evaluation completed (see details above) with patient education regarding purpose of PT evaluation, PT POC and goals, therapy schedule, weekly team meetings, and other CIR information including safety plan and fall risk safety. Pt received supine in bed with his wife, [name], present and pt agreeable to therapy session. Therapist performed max assist B LE PROM into hip/knee flexion due to severe extensor tone - held in flexed position for ~20-30seconds each LE for stretch. Maintained B LEs flexed in hooklying position then performed R/L rolling with +2 max assist for bed mobility assessment and tone management. Supine hooklying lower trunk rotations  for tone management as well. Throughout session pt would intermittently say "owe" during LE ROM but when asked if in pain denied it. Also, pt repeatedly would respond "right" when asked a yes/no question but within 5 seconds later he would correct himself to "no" if that is actually what he meant to say. Progressed supine>sitting L EOB to allow pt to use L UE to assist with trunk upright but pt with poor initiation and motor planning requiring +2 total assist to come upright - no significant extensor tone noted while coming to sitting or while in sitting. Performed sitting balance EOB for ~8 minutes while progressing from static sitting with CGA/min assist to slight dynamic challenges within BOS performing reaches with L UE - pt unable to initiate balance recovery when LOB starts to occur requiring total assist to prevent LOB. Sit>supine +2 total assist with cuing for increased use of LUE to assist with trunk descent. Supine scoot towards HOB with +2 total assist via bed pads. Positioned in upright "chair" position in bed to maintain alertness for SLP session. Pt left in bed with needs in reach, bed alarm on, pt's wife present, and SLP present to assume care of pt.  Mobility Bed Mobility Bed Mobility: Rolling Right;Rolling Left;Sit to Supine;Supine to Sit Rolling Right: 2 Helpers;Maximal Assistance - Patient 25-49% Rolling Left: 2 Helpers;Maximal Assistance - Patient 25-49% Supine to Sit: 2 Helpers;Total Assistance - Patient < 25% Sit to Supine: 2 Helpers;Total Assistance - Patient < 25% Transfers Transfers:  (not safe to attempt at this time) Locomotion  Gait Ambulation: No Gait Gait: No Stairs / Additional Locomotion Stairs: No Wheelchair Mobility Wheelchair Mobility: No    Discharge Criteria: Patient will be discharged from PT if patient refuses treatment 3 consecutive times without medical reason, if treatment goals not met, if there is a change in medical status, if patient makes no  progress towards goals or if patient is discharged from hospital.  The above assessment, treatment plan, treatment alternatives and goals were discussed and mutually agreed upon: by patient and by family  Tawana Scale , PT, DPT, CSRS 05/03/2020, 8:02 AM

## 2020-05-03 NOTE — Evaluation (Signed)
Occupational Therapy Assessment and Plan  Patient Details  Name: Anthony Downs MRN: [id number] Date of Birth: September 01, 1950  OT Diagnosis: abnormal posture, apraxia, cognitive deficits, flaccid hemiplegia and hemiparesis, hemiplegia affecting dominant side, muscle weakness (generalized), pain in joint and swelling of limb Rehab Potential: Rehab Potential (ACUTE ONLY): Fair ELOS: 4.5-5.5 weeks   Today's Date: 05/03/2020 OT Individual Time: 1300-1400 OT Individual Time Calculation (min): 60 min     Hospital Problem: Principal Problem:   Brain tumor [facility]) Active Problems:   Meningioma [facility])   Past Medical History:  Past Medical History:  Diagnosis Date  . Arthritis   . Asthma    as a child  . 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 Complication of anesthesia    slow to wake up and pain medicines make him sick  . GERD (gastroesophageal reflux disease)   . Hypertension   . PONV (postoperative nausea and vomiting)   . Sleep apnea    no Cpap  . Subdural hematoma, post-traumatic ([place]) 2008   fell off truck hit head on concrete   Past Surgical History:  Past Surgical History:  Procedure Laterality Date  . APPLICATION OF CRANIAL NAVIGATION Left 04/29/2020   Procedure: APPLICATION OF CRANIAL NAVIGATION;  Surgeon: Judith Part, MD;  Location: [place];  Service: Neurosurgery;  Laterality: Left;  . arthroscopic knee    . COLONOSCOPY N/A 11/29/2018   Procedure: COLONOSCOPY;  Surgeon: Rogene Houston, MD;  [facility];  Service: Endoscopy;  Laterality: N/A;  730-rescheduled 9/2 same time per Lelon Frohlich  . CRANIOTOMY Left 04/29/2020   Procedure: LEFT CRANIECTOMY WITH TUMOR EXCISION;  Surgeon: Judith Part, MD;  Location: [place];  Service: Neurosurgery;  Laterality: Left;  . NASAL SEPTOPLASTY W/ TURBINOPLASTY Bilateral 03/19/2020   Procedure: NASAL SEPTOPLASTY WITH BILATERAL  TURBINATE REDUCTION;  Surgeon: Leta Baptist, MD;  Location: [place];   Service: ENT;  Laterality: Bilateral;  . VASECTOMY      Assessment & Plan Clinical Impression: Cosme P. Casale is a 70 year old right-handed male with history of hypertension, sleep apnea no CPAP, hyperlipidemia, subdural hematoma post traumatic fall from a truck striking his head on concrete 2008. Per chart review lives with spouse. 1 level home 2 steps to entry. Independent prior to admission. Presented 04/28/2020 with progressive right lower extremity greater than right upper extremity weakness over the past few months. He had a baseline slowly progressive tremor in the right upper extremity. Denied any seizure. MRI imaging revealed convexity meningioma left greater than right with surrounding vasogenic edema in the left posterior frontal lobe. Patient underwent left craniectomy with tumor excision 04/29/2020 per Dr. Venetia Constable. Maintained on Decadron protocol. He was cleared to begin subcutaneous heparin for DVT prophylaxis for 14 2022. Tolerating a regular diet. Therapy evaluations completed due to patient's progressive weakness recommendations physical medicine rehab consult and patient was admitted for a comprehensive rehab program.   Patient currently requires total +2 with basic self-care skills secondary to muscle weakness, decreased cardiorespiratoy endurance, impaired timing and sequencing, abnormal tone, unbalanced muscle activation, motor apraxia, decreased coordination and decreased motor planning, decreased initiation, decreased attention, decreased awareness, decreased problem solving, decreased safety awareness and delayed processing and decreased sitting balance, decreased standing balance, decreased postural control, hemiplegia and decreased balance strategies.  Prior to hospitalization, patient could complete BADL with independent .  Patient will benefit from skilled intervention to decrease level of assist with basic self-care skills and  increase independence with  basic self-care skills prior to discharge home with care partner.  Anticipate patient will require moderate physical assestance and follow up home health.  OT - End of Session Activity Tolerance: Tolerates 10 - 20 min activity with multiple rests Endurance Deficit: Yes OT Assessment Rehab Potential (ACUTE ONLY): Fair OT Barriers to Discharge: Decreased caregiver support;Weight OT Barriers to Discharge Comments: unsure if family will be able to provide amount of physical A pt may require at DC OT Patient demonstrates impairments in the following area(s): Balance;Cognition;Edema;Endurance;Motor;Pain;Perception;Safety;Sensory;Skin Integrity OT Basic ADL's Functional Problem(s): Bathing;Grooming;Toileting;Dressing OT Transfers Functional Problem(s): Toilet OT Additional Impairment(s): Fuctional Use of Upper Extremity OT Plan OT Intensity: Minimum of 1-2 x/day, 45 to 90 minutes OT Frequency: 5 out of 7 days OT Duration/Estimated Length of Stay: 4.5-5.5 weeks OT Treatment/Interventions: Balance/vestibular training;Discharge planning;Pain management;Self Care/advanced ADL retraining;Therapeutic Activities;UE/LE Coordination activities;Visual/perceptual remediation/compensation;Therapeutic Exercise;Skin care/wound managment;Patient/family education;Functional mobility training;Disease mangement/prevention;Cognitive remediation/compensation;DME/adaptive equipment instruction;Community reintegration;Neuromuscular re-education;Psychosocial support;Splinting/orthotics;UE/LE Strength taining/ROM;Wheelchair propulsion/positioning OT Self Feeding Anticipated Outcome(s): S OT Basic Self-Care Anticipated Outcome(s): S grooming; MIN A shirt; MOD A LB OT Toileting Anticipated Outcome(s): MAX A toileting; MIN A bathing OT Bathroom Transfers Anticipated Outcome(s): MOD A OT Recommendation Patient destination: Home Follow Up Recommendations: Home health OT Equipment Recommended: Tub/shower bench;To be  determined Equipment Details: [facility]   OT Evaluation Precautions/Restrictions  Precautions Precautions: Fall Precaution Comments: Strong extensor tone Restrictions Weight Bearing Restrictions: No General Chart Reviewed: Yes Family/Caregiver Present: Yes Vital Signs Therapy Vitals Temp: 99.9 F (37.7 C) Temp Source: Oral Pulse Rate: 81 Resp: 15 BP: (!) 134/54 Patient Position (if appropriate): Lying Oxygen Therapy SpO2: 96 % O2 Device: Room Air Pain Pain Assessment Pain Scale: 0-10 Pain Score: 3  Faces Pain Scale: Hurts a little bit Pain Type: Chronic pain Pain Location: Back Pain Orientation: Mid Pain Descriptors / Indicators: Aching Pain Frequency: Occasional Patients Stated Pain Goal: 0 Pain Intervention(s): Medication (See eMAR) Home Living/Prior Functioning Home Living Available Help at Discharge: Family,Available 24 hours/day Type of Home: House  Lives With: Spouse Prior Function Vocation: Retired Surveyor, mining Baseline Vision/History: Wears glasses Wears Glasses: Reading only Patient Visual Report: No change from baseline Vision Assessment?: No apparent visual deficits Perception  Perception: Impaired Praxis Praxis: Impaired Praxis Impairment Details: Initiation;Motor planning Cognition Overall Cognitive Status: Impaired/Different from baseline Arousal/Alertness: Awake/alert Orientation Level: Person;Place;Situation Person: Oriented Place: Oriented Situation: Oriented Year: 2022 Month: April Day of Week: Correct Memory: Impaired Memory Impairment: Decreased short term memory Decreased Short Term Memory: Verbal basic;Functional basic Immediate Memory Recall: Sock;Blue;Bed Memory Recall Sock: Without Cue Memory Recall Blue: With Cue Memory Recall Bed: Without Cue Attention: Sustained Sustained Attention: Appears intact Safety/Judgment: Impaired Sensation   Motor  Motor Motor: Abnormal tone;Hemiplegia;Clonus;Abnormal postural alignment and  control;Motor apraxia Motor - Skilled Clinical Observations: Extensor tone RLE>LLE; clonus/tremors R extremities  Trunk/Postural Assessment  Cervical Assessment Cervical Assessment: Exceptions to [facility] (head forward) Thoracic Assessment Thoracic Assessment: Exceptions to [facility] (rounded shoudlers) Lumbar Assessment Lumbar Assessment:  (post preference) Postural Control Postural Control: Deficits on evaluation (min delayes seated)  Balance Balance Balance Assessed:  (Per PT MIN A Seated EOB wiht R lateral lean/LOB) Extremity/Trunk Assessment RUE Assessment RUE Assessment: Exceptions to [facility] General Strength Comments: increased tone in pec/triceps noted, full PROM achievable RUE Body System: Neuro Brunstrum levels for arm and hand: Arm;Hand Brunstrum level for arm: Stage I Presynergy Brunstrum level for hand: Stage I Flaccidity LUE Assessment LUE Assessment: Exceptions to [facility] General Strength Comments: 3/5 overall; tremors at baseline  Care Tool Care  Tool Self Care Eating        Oral Care    Oral Care Assist Level: Moderate Assistance - Patient 50 - 74%    Bathing   Body parts bathed by patient: Left arm;Chest;Abdomen;Face Body parts bathed by helper: Right arm;Front perineal area;Buttocks;Right upper leg;Left upper leg;Right lower leg;Left lower leg   Assist Level: 2 Helpers    Upper Body Dressing(including orthotics)   What is the patient wearing?: Pull over shirt   Assist Level: 2 Helpers    Lower Body Dressing (excluding footwear)   What is the patient wearing?: Pants Assist for lower body dressing: 2 Helpers    Putting on/Taking off footwear   What is the patient wearing?: Non-skid slipper socks Assist for footwear: 2 Helpers       Care Tool Toileting Toileting activity   Assist for toileting: 2 Helpers     Care Tool Bed Mobility Roll left and right activity        Sit to lying activity        Lying to sitting edge of bed activity         Care Tool  Transfers Sit to stand transfer Sit to stand activity did not occur: Safety/medical concerns      Chair/bed transfer Chair/bed transfer activity did not occur: Safety/medical concerns       Toilet transfer Toilet transfer activity did not occur: Safety/medical concerns       Care Tool Cognition Expression of Ideas and Wants Expression of Ideas and Wants: Some difficulty - exhibits some difficulty with expressing needs and ideas (e.g, some words or finishing thoughts) or speech is not clear   Understanding Verbal and Non-Verbal Content Understanding Verbal and Non-Verbal Content: Usually understands - understands most conversations, but misses some part/intent of message. Requires cues at times to understand   Memory/Recall Ability *first 3 days only Memory/Recall Ability *first 3 days only: Current season;Location of own room;That he or she is in a hospital/hospital unit    Refer to Care Plan for [place] 1 OT Short Term Goal 1 (Week 1): Pt will roll with MAX A of 1 caregiver to decrease BOC for toileitng OT Short Term Goal 2 (Week 1): Pt will tolerate laying sidelying on R 1x per day to assist with tone management OT Short Term Goal 3 (Week 1): Pt will recall hemi dressing techniques with MOD VC OT Short Term Goal 4 (Week 1): Pt will sit EOB 10 min with mod A in prep for ADL  Recommendations for other services: Therapeutic Recreation  Pet therapy and Stress management   Skilled Therapeutic Intervention 1:1. Pt received in bed agreeable to OT after explanation of role/purpose of OT, CIR, ELOS, POC and goals. Pt participates in bed level ADL as follows below. +2 requried for all bed mobility and rolling compelted after OT provides knee/hip flexion of BLEs, rhythmic rotations of trunk/pelvis to break up significant extensor tone. Pt agreeable to sidelying on R with pillows between knees/back and around hemi arm to assist with tone management. OT adjusts bed so  pt able ot watch baseball game while in sidelying. Exited session with pt seated in bed, exit alarm on and call light in reach  ADL ADL Grooming: Moderate assistance (per pt report) Where Assessed-Grooming: Bed level Upper Body Bathing: Moderate assistance Where Assessed-Upper Body Bathing: Bed level Lower Body Bathing: Dependent (+2 rolling) Where Assessed-Lower Body Bathing: Bed level Upper Body Dressing: Dependent (+2 for rolling  to pull down back) Where Assessed-Upper Body Dressing: Bed level Lower Body Dressing: Dependent Where Assessed-Lower Body Dressing: Bed level Toileting: Unable to assess Mobility  Bed Mobility Bed Mobility: Rolling Right;Rolling Left Rolling Right: 2 Helpers Rolling Left: 2 Helpers   Discharge Criteria: Patient will be discharged from OT if patient refuses treatment 3 consecutive times without medical reason, if treatment goals not met, if there is a change in medical status, if patient makes no progress towards goals or if patient is discharged from hospital.  The above assessment, treatment plan, treatment alternatives and goals were discussed and mutually agreed upon: by patient and by family  [name] 05/03/2020, 4:42 PM

## 2020-05-04 ENCOUNTER — Inpatient Hospital Stay (HOSPITAL_COMMUNITY): Payer: Medicare Other

## 2020-05-04 DIAGNOSIS — M7989 Other specified soft tissue disorders: Secondary | ICD-10-CM

## 2020-05-04 LAB — HEPARIN LEVEL (UNFRACTIONATED): Heparin Unfractionated: 0.51 IU/mL (ref 0.30–0.70)

## 2020-05-04 MED ORDER — HEPARIN (PORCINE) 25000 UT/250ML-% IV SOLN
1500.0000 [IU]/h | INTRAVENOUS | Status: DC
  Administered 2020-05-04: 1750 [IU]/h via INTRAVENOUS
  Administered 2020-05-05: 1700 [IU]/h via INTRAVENOUS
  Filled 2020-05-04 (×2): qty 250

## 2020-05-04 MED ORDER — TAMSULOSIN HCL 0.4 MG PO CAPS
0.8000 mg | ORAL_CAPSULE | Freq: Every day | ORAL | Status: DC
  Administered 2020-05-04 – 2020-06-16 (×44): 0.8 mg via ORAL
  Filled 2020-05-04 (×44): qty 2

## 2020-05-04 NOTE — Progress Notes (Signed)
ANTICOAGULATION CONSULT NOTE - Initial Consult  Pharmacy Consult for Heparin Indication: DVT  No Known Allergies  Patient Measurements:   Heparin Dosing Weight: 109.3 kg  Vital Signs: Temp: 98.5 F (36.9 C) (04/17 0356) Temp Source: Oral (04/17 0356) BP: 147/80 (04/17 0356) Pulse Rate: 72 (04/17 0356)  Labs: Recent Labs    05/02/20 1826  HGB 11.0*  HCT 33.6*  PLT 196  CREATININE 0.91    Estimated Creatinine Clearance: 107.1 mL/min (by C-G formula based on SCr of 0.91 mg/dL).   Medical History: Past Medical History:  Diagnosis Date  . Arthritis   . Asthma    as a child  . 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 Complication of anesthesia    slow to wake up and pain medicines make him sick  . GERD (gastroesophageal reflux disease)   . Hypertension   . PONV (postoperative nausea and vomiting)   . Sleep apnea    no Cpap  . 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 On 4/12 the pt was taken for craniotomy and resection (some possible residual tumor in the patent portion of the sinus). The patient is now in CIR with a doppler showing age indeterminate DVT involving the L posterior tibial vein. Per Dr. Ranell Patrick, anticoagulation has been cleared with neurosurgery. PTA the patient is not on anticoagulation. Pharmacy is consulted to dose heparin.  Due to recent craniotomy, will not bolus heparin and target the lower end of goal to minimize risk of bleeding.  Goal of Therapy:  Heparin level 0.3-0.7 units/ml Monitor platelets by anticoagulation protocol: Yes   Plan:  Initiate heparin IV 1750 units/hr Obtain a 6-hr heparin level  Monitor daily heparin level and CBC Monitor for signs and symptoms of bleeding   Shauna Hugh, PharmD, [place]   PGY-1 Pharmacy Resident 05/04/2020 12:35 PM  Please check [email or web address] for unit-specific pharmacy phone numbers.

## 2020-05-04 NOTE — Progress Notes (Signed)
Lower extremity venous bilateral study completed.  Preliminary results relayed to Wells Guiles, RN.   See CV Proc for preliminary results report.   Darlin Coco, RDMS, RVT

## 2020-05-04 NOTE — Progress Notes (Signed)
ANTICOAGULATION CONSULT NOTE   Pharmacy Consult for Heparin Indication: DVT  No Known Allergies  Patient Measurements: Weight: 133.5 kg (294 lb 5 oz) Heparin Dosing Weight: 109.3 kg  Vital Signs: Temp: 98 F (36.7 C) (04/17 2025) Temp Source: Oral (04/17 2025) BP: 153/69 (04/17 2025) Pulse Rate: 88 (04/17 2025)  Labs: Recent Labs    05/02/20 1826 05/04/20 2208  HGB 11.0*  --   HCT 33.6*  --   PLT 196  --   HEPARINUNFRC  --  0.51  CREATININE 0.91  --     Estimated Creatinine Clearance: 105.3 mL/min (by C-G formula based on SCr of 0.91 mg/dL).     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 On 4/12 the pt was taken for craniotomy and resection (some possible residual tumor in the patent portion of the sinus). The patient is now in CIR with a doppler showing age indeterminate DVT involving the L posterior tibial vein. Per Dr. Ranell Patrick, anticoagulation has been cleared with neurosurgery. PTA the patient is not on anticoagulation. Pharmacy is consulted to dose heparin.  Due to recent craniotomy, will not bolus heparin and target the lower end of goal to minimize risk of bleeding.  Heparin level slightly supratherapeutic (0.51) on gtt at 1750 units/hr.  Goal of Therapy:  Heparin level 0.3-0.5 units/ml Monitor platelets by anticoagulation protocol: Yes   Plan:  Decrease IV heparin slightly to 1700 units/hr F/u 8 hr heparin level  Sherlon Handing, PharmD, BCPS Please see amion for complete clinical pharmacist phone list 05/04/2020 11:14 PM

## 2020-05-04 NOTE — Progress Notes (Signed)
PROGRESS NOTE   Subjective/Complaints: Vascular ultrasound positive for age indeterminate DVT of left posterior tibial vein- called Dr. Sheldon Silvan office to discuss therapeutic anticoagulation- Dr. Christella Noa approved. Discussed with pharmacy starting heparin drip given high risk for bleed- will also repeat US in 2 days to see if any changes noted in clot  ROS: +urinary urgency, +lower extremity edema   Objective:   VAS [place] LOWER EXTREMITY VENOUS (DVT)  Result Date: 05/04/2020  Lower Venous DVT Study Indications: Swelling.  Risk Factors: Surgery 04-29-2020 LT craniectomy w/ tumor excision. Anticoagulation: Subcutaneous heparin. Comparison Study: No prior studies. Performing Technologist: Darlin Coco RDMS,RVT  Examination Guidelines: A complete evaluation includes B-mode imaging, spectral Doppler, color Doppler, and power Doppler as needed of all accessible portions of each vessel. Bilateral testing is considered an integral part of a complete examination. Limited examinations for reoccurring indications may be performed as noted. The reflux portion of the exam is performed with the patient in reverse Trendelenburg.  +---------+---------------+---------+-----------+----------+--------------+ RIGHT    CompressibilityPhasicitySpontaneityPropertiesThrombus Aging +---------+---------------+---------+-----------+----------+--------------+ CFV      Full           Yes      Yes                                 +---------+---------------+---------+-----------+----------+--------------+ SFJ      Full                                                        +---------+---------------+---------+-----------+----------+--------------+ FV Prox  Full                                                        +---------+---------------+---------+-----------+----------+--------------+ FV Mid   Full                                                         +---------+---------------+---------+-----------+----------+--------------+ FV DistalFull                                                        +---------+---------------+---------+-----------+----------+--------------+ PFV      Full                                                        +---------+---------------+---------+-----------+----------+--------------+ POP      Full  Yes      Yes                                 +---------+---------------+---------+-----------+----------+--------------+ PTV      Full                                                        +---------+---------------+---------+-----------+----------+--------------+ PERO     Full                                                        +---------+---------------+---------+-----------+----------+--------------+   +---------+---------------+---------+-----------+----------+-------------------+ LEFT     CompressibilityPhasicitySpontaneityPropertiesThrombus Aging      +---------+---------------+---------+-----------+----------+-------------------+ CFV      Full           Yes      Yes                                      +---------+---------------+---------+-----------+----------+-------------------+ SFJ      Full                                                             +---------+---------------+---------+-----------+----------+-------------------+ FV Prox  Full                                                             +---------+---------------+---------+-----------+----------+-------------------+ FV Mid   Full                                                             +---------+---------------+---------+-----------+----------+-------------------+ FV DistalFull                                                             +---------+---------------+---------+-----------+----------+-------------------+ PFV      Full                                                              +---------+---------------+---------+-----------+----------+-------------------+ POP      Full           Yes      Yes  Age Indeterminate   +---------+---------------+---------+-----------+----------+-------------------+ PTV      None           No       No                                       +---------+---------------+---------+-----------+----------+-------------------+ PERO                                                  Not well visualized +---------+---------------+---------+-----------+----------+-------------------+     Summary: RIGHT: - There is no evidence of deep vein thrombosis in the lower extremity.  - No cystic structure found in the popliteal fossa.  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  - A cystic structure is found in the popliteal fossa.  *See table(s) above for measurements and observations.    Preliminary    Recent Labs    05/02/20 1826  WBC 9.9  HGB 11.0*  HCT 33.6*  PLT 196   Recent Labs    05/02/20 1826  CREATININE 0.91    Intake/Output Summary (Last 24 hours) at 05/04/2020 1125 Last data filed at 05/04/2020 0728 Gross per 24 hour  Intake 900 ml  Output 2250 ml  Net -1350 ml        Physical Exam: Vital Signs Blood pressure (!) 147/80, pulse 72, temperature 98.5 F (36.9 C), temperature source Oral, resp. rate 16, SpO2 97 %. Gen: no distress, normal appearing HEENT: oral mucosa pink and moist, NCAT Cardio: Reg rate Chest: normal effort, normal rate of breathing Abd: soft, non-distended Ext: no edema Psych: pleasant, normal affect Skin: No evidence of breakdown, no evidence of rash Neurologic: Cranial nerves II through XII intact, motor strength is 5/5 in Left and 0/5 right deltoid, tricep, grip,trace bilateral hip flexor, knee extensors, ankle dorsiflexor and plantar flexor, 1/5 biceps! Sensory exam normal  sensation to light touch and proprioception in bilateral upper and lower extremities Cerebellar exam normal finger to nose to fingerLeft upper Musculoskeletal:limited ROM with knee flexion and hip flexion. No joint swelling Increased hip ext tone   Assessment/Plan: 1. Functional deficits which require 3+ hours per day of interdisciplinary therapy in a comprehensive inpatient rehab setting.  Physiatrist is providing close team supervision and 24 hour management of active medical problems listed below.  Physiatrist and rehab team continue to assess barriers to discharge/monitor patient progress toward functional and medical goals  Care Tool:  Bathing    Body parts bathed by patient: Left arm,Chest,Abdomen,Face   Body parts bathed by helper: Right arm,Front perineal area,Buttocks,Right upper leg,Left upper leg,Right lower leg,Left lower leg     Bathing assist Assist Level: 2 Helpers     Upper Body Dressing/Undressing Upper body dressing   What is the patient wearing?: Pull over shirt    Upper body assist Assist Level: 2 Helpers    Lower Body Dressing/Undressing Lower body dressing      What is the patient wearing?: Pants     Lower body assist Assist for lower body dressing: 2 Helpers     Toileting Toileting    Toileting assist Assist for toileting: 2 Helpers     Transfers Chair/bed transfer  Transfers assist  Chair/bed transfer activity did not occur: Safety/medical  concerns        Locomotion Ambulation   Ambulation assist   Ambulation activity did not occur: Safety/medical concerns          Walk 10 feet activity   Assist  Walk 10 feet activity did not occur: Safety/medical concerns        Walk 50 feet activity   Assist Walk 50 feet with 2 turns activity did not occur: Safety/medical concerns         Walk 150 feet activity   Assist Walk 150 feet activity did not occur: Safety/medical concerns         Walk 10 feet on uneven  surface  activity   Assist Walk 10 feet on uneven surfaces activity did not occur: Safety/medical concerns         Wheelchair     Assist Will patient use wheelchair at discharge?: Yes (TBD but likely)             Wheelchair 50 feet with 2 turns activity    Assist            Wheelchair 150 feet activity     Assist          Blood pressure (!) 147/80, pulse 72, temperature 98.5 F (36.9 C), temperature source Oral, resp. rate 16, SpO2 97 %.    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 Decadron taper -Patient may  Shower with cap -ELOS/Goals: 3-4 wks minA goals  -Continue CIR 2. Antithrombotics: -DVT/anticoagulation:Subcutaneous heparin. Vascular ultrasound positive for age indeterminate left posterior tibial vein- called Dr. Sheldon Silvan office regarding starting anticoagulation. Dr. Christella Noa approved, discussed with pharmacy starting IV heparin given high bleeding risk in setting of recent craniotomy. Discussed with patient and wife and they are in agreement with plan. Repeat US in 2 days to assess if there is any change in clot/weigh risks and benefits of anticoagulation.  -antiplatelet therapy: N/A 3. Post-operative pain:Well controlled. Discussing d/cing Hydrocodone and wife and patient agreeable. Tylenol PRN for pain.  4. Mood:Provide emotional support -antipsychotic agents: N/A 5. Neuropsych: This patientiscapable of making decisions on hisown behalf. 6. Skin/Wound Care:Routine skin checks 7. Fluids/Electrolytes/Nutrition:Routine in and outs with follow-up chemistries 8. Hypertension. Lasix 20 mg daily, Norvasc 10 mg daily. Monitor with increased mobility  4/16: as per wife, was recommended for Norvasc to be d/ced  given leg swelling. D/ced accordingly. Restarted home Flomax HS which will also help with BP 9. Hyperlipidemia. Lipitor 10. BPH: increase Flomax to 0.8mg  HS 11. Urinary frequency: UA negative. Recommended timed voiding himself q1H while awake with urinal. Increase Flomax to 0.8mg  given continued symptoms and elevated BP  LOS: 2 days A FACE TO FACE EVALUATION WAS PERFORMED  Manisha Cancel P Kadesha Virrueta 05/04/2020, 11:25 AM

## 2020-05-05 DIAGNOSIS — R350 Frequency of micturition: Secondary | ICD-10-CM

## 2020-05-05 DIAGNOSIS — I82442 Acute embolism and thrombosis of left tibial vein: Secondary | ICD-10-CM

## 2020-05-05 DIAGNOSIS — I10 Essential (primary) hypertension: Secondary | ICD-10-CM

## 2020-05-05 LAB — COMPREHENSIVE METABOLIC PANEL
ALT: 37 U/L (ref 0–44)
AST: 21 U/L (ref 15–41)
Albumin: 3.2 g/dL — ABNORMAL LOW (ref 3.5–5.0)
Alkaline Phosphatase: 69 U/L (ref 38–126)
Anion gap: 7 (ref 5–15)
BUN: 16 mg/dL (ref 8–23)
CO2: 26 mmol/L (ref 22–32)
Calcium: 9.1 mg/dL (ref 8.9–10.3)
Chloride: 106 mmol/L (ref 98–111)
Creatinine, Ser: 0.83 mg/dL (ref 0.61–1.24)
GFR, Estimated: >60 mL/min (ref >60)
Glucose, Bld: 127 mg/dL — ABNORMAL HIGH (ref 70–99)
Potassium: 4.1 mmol/L (ref 3.5–5.1)
Sodium: 139 mmol/L (ref 135–145)
Total Bilirubin: 0.7 mg/dL (ref 0.3–1.2)
Total Protein: 6.2 g/dL — ABNORMAL LOW (ref 6.5–8.1)

## 2020-05-05 LAB — CBC WITH DIFFERENTIAL/PLATELET
Abs Immature Granulocytes: 0.22 10*3/uL — ABNORMAL HIGH (ref 0.00–0.07)
Basophils Absolute: 0 10*3/uL (ref 0.0–0.1)
Basophils Relative: 0 %
Eosinophils Absolute: 0 10*3/uL (ref 0.0–0.5)
Eosinophils Relative: 0 %
HCT: 36.4 % — ABNORMAL LOW (ref 39.0–52.0)
Hemoglobin: 12.3 g/dL — ABNORMAL LOW (ref 13.0–17.0)
Immature Granulocytes: 2 %
Lymphocytes Relative: 24 %
Lymphs Abs: 3 10*3/uL (ref 0.7–4.0)
MCH: 31.8 pg (ref 26.0–34.0)
MCHC: 33.8 g/dL (ref 30.0–36.0)
MCV: 94.1 fL (ref 80.0–100.0)
Monocytes Absolute: 1 10*3/uL (ref 0.1–1.0)
Monocytes Relative: 8 %
Neutro Abs: 8.2 10*3/uL — ABNORMAL HIGH (ref 1.7–7.7)
Neutrophils Relative %: 66 %
Platelets: 258 10*3/uL (ref 150–400)
RBC: 3.87 MIL/uL — ABNORMAL LOW (ref 4.22–5.81)
RDW: 13.2 % (ref 11.5–15.5)
WBC: 12.4 10*3/uL — ABNORMAL HIGH (ref 4.0–10.5)
nRBC: 0 % (ref 0.0–0.2)

## 2020-05-05 LAB — HEPARIN LEVEL (UNFRACTIONATED): Heparin Unfractionated: 0.83 IU/mL — ABNORMAL HIGH (ref 0.30–0.70)

## 2020-05-05 MED ORDER — RIVAROXABAN 15 MG PO TABS
15.0000 mg | ORAL_TABLET | ORAL | Status: AC
  Administered 2020-05-05: 15 mg via ORAL
  Filled 2020-05-05: qty 1

## 2020-05-05 MED ORDER — RIVAROXABAN 20 MG PO TABS
20.0000 mg | ORAL_TABLET | Freq: Every day | ORAL | Status: DC
  Administered 2020-05-26 – 2020-06-16 (×22): 20 mg via ORAL
  Filled 2020-05-05 (×22): qty 1

## 2020-05-05 MED ORDER — SORBITOL 70 % SOLN
30.0000 mL | Freq: Every day | Status: DC | PRN
  Administered 2020-05-05 – 2020-05-06 (×2): 30 mL via ORAL
  Filled 2020-05-05 (×2): qty 30

## 2020-05-05 MED ORDER — RIVAROXABAN 15 MG PO TABS
15.0000 mg | ORAL_TABLET | Freq: Two times a day (BID) | ORAL | Status: AC
  Administered 2020-05-05 – 2020-05-25 (×41): 15 mg via ORAL
  Filled 2020-05-05 (×42): qty 1
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
## 2020-05-05 NOTE — Care Management (Signed)
Inpatient Rehabilitation Center Individual Statement of Services  Patient Name:  Anthony Downs  Date:  05/05/2020  Welcome to the [facility].  Our goal is to provide you with an individualized program based on your diagnosis and situation, designed to meet your specific needs.  With this comprehensive rehabilitation program, you will be expected to participate in at least 3 hours of rehabilitation therapies Monday-Friday, with modified therapy programming on the weekends.  Your rehabilitation program will include the following services:  Physical Therapy (PT), Occupational Therapy (OT), Speech Therapy (ST), 24 hour per day rehabilitation nursing, Therapeutic Recreaction (TR), Psychology, Neuropsychology, Care Coordinator, Rehabilitation Medicine, Nutrition Services, Pharmacy Services and Other  Weekly team conferences will be held on Tuesdays to discuss your progress.  Your Inpatient Rehabilitation Care Coordinator will talk with you frequently to get your input and to update you on team discussions.  Team conferences with you and your family in attendance may also be held.  Expected length of stay: 4-6 weeks   Overall anticipated outcome: Moderate Assistance  Depending on your progress and recovery, your program may change. Your Inpatient Rehabilitation Care Coordinator will coordinate services and will keep you informed of any changes. Your Inpatient Rehabilitation Care Coordinator's name and contact numbers are listed  below.  The following services may also be recommended but are not provided by the [facility] will be made to provide these services after discharge if needed.  Arrangements include referral to agencies that provide these services.  Your insurance has been verified to be:  Medicare  A/B  Your primary doctor is:  Engineer, petroleum  Pertinent information will be shared with your doctor and your insurance company.  Inpatient Rehabilitation Care Coordinator:  Cathleen Corti [phone] or ([phone]  Information discussed with and copy given to patient by: Rana Snare, 05/05/2020, 12:39 PM

## 2020-05-05 NOTE — IPOC Note (Signed)
Overall Plan of Care [facility]) Patient Details Name: Anthony Downs MRN: [id number] DOB: Feb 26, 1950  Admitting Diagnosis: Brain tumor [facility]) bifrontal meningiomas  Hospital Problems: Principal Problem:   Brain tumor ([place]) Active Problems:   Meningioma ([facility])     Functional Problem List: Nursing Behavior,Bladder,Bowel,Edema,Medication Management,Endurance,Motor,Pain,Skin Integrity,Safety  PT Balance,Perception,Behavior,Safety,Edema,Sensory,Endurance,Skin Integrity,Motor,Nutrition,Pain  OT Balance,Cognition,Edema,Endurance,Motor,Pain,Perception,Safety,Sensory,Skin Integrity  SLP Cognition,Safety  TR         Basic ADL's: OT Bathing,Grooming,Toileting,Dressing     Advanced  ADL's: OT       Transfers: PT Bed Mobility,Bed to [employer]  OT Toilet     Locomotion: PT Ambulation,Wheelchair Mobility,Stairs     Additional Impairments: OT Fuctional Use of Upper Extremity  SLP        TR      Anticipated Outcomes Item Anticipated Outcome  Self Feeding S  Swallowing      Basic self-care  S grooming; MIN A shirt; MOD A LB  Toileting  MAX A toileting; MIN A bathing   Bathroom Transfers MOD A  Bowel/Bladder  Mod Assist  Transfers  Mod assist with LRAD  Locomotion  Max assist with LRAD  Communication     Cognition  Mod I  Pain  <3  Safety/Judgment  mod A with no falls   Therapy Plan: PT Intensity: Minimum of 1-2 x/day ,45 to 90 minutes PT Frequency: 5 out of 7 days PT Duration Estimated Length of Stay: ~ 6 weeks OT Intensity: Minimum of 1-2 x/day, 45 to 90 minutes OT Frequency: 5 out of 7 days OT Duration/Estimated Length of Stay: 4.5-5.5 weeks SLP Intensity: Minumum of 1-2 x/day, 30 to 90 minutes SLP Frequency: 3 to 5 out of 7 days SLP Duration/Estimated Length of Stay: 6 weeks   Due to the current state of emergency, patients may not be receiving their 3-hours of Medicare-mandated therapy.   Team Interventions: Nursing Interventions  Patient/Family Education,Bladder Management,Bowel Management,Disease Management/Prevention,Pain Management,Medication Management,Skin Care/Wound Management,Discharge Planning,Psychosocial Support  PT interventions Ambulation/gait training,Community Corporate treasurer re-education,Psychosocial support,Stair training,UE/LE Strength taining/ROM,Wheelchair propulsion/positioning,Balance/vestibular training,Functional electrical stimulation,Discharge planning,Pain management,Skin care/wound management,Therapeutic Activities,UE/LE Coordination activities,Cognitive remediation/compensation,Disease management/prevention,Functional mobility training,Patient/family education,Splinting/orthotics,Therapeutic Exercise,Visual/perceptual remediation/compensation  OT Interventions Balance/vestibular training,Discharge planning,Pain management,Self Care/advanced ADL retraining,Therapeutic Activities,UE/LE Coordination activities,Visual/perceptual remediation/compensation,Therapeutic Exercise,Skin care/wound managment,Patient/family education,Functional mobility training,Disease mangement/prevention,Cognitive remediation/compensation,DME/adaptive equipment instruction,Community reintegration,Neuromuscular re-education,Psychosocial support,Splinting/orthotics,UE/LE Strength taining/ROM,Wheelchair propulsion/positioning  SLP Interventions Cognitive remediation/compensation,Therapeutic Activities,Therapeutic Exercise,Internal/external aids,Medication managment,Patient/family education,Cueing hierarchy,Functional tasks  TR Interventions    SW/CM Interventions     Barriers to Discharge MD  Medical stability  Nursing Decreased caregiver support,Home environment access/layout,Incontinence,Wound Care,Lack of/limited family support,Weight,Medication compliance,Behavior    PT Inaccessible home environment    OT Decreased caregiver support,Weight unsure if family will be able to provide  amount of physical A pt may require at DC  SLP      SW       Team Discharge Planning: Destination: PT-Home ,OT- Home , SLP-Home Projected Follow-up: PT-Home health PT,24 hour supervision/assistance, OT-  Home health OT, SLP-Outpatient SLP Projected Equipment Needs: PT-To be determined, OT- Tub/shower bench,To be determined, SLP-To be determined Equipment Details: PT- , OT-DAC Patient/family involved in discharge planning: PT- Patient,Family member/caregiver,  OT-Patient,Family member/caregiver, SLP-Patient,Family member/caregiver  MD ELOS: 4-5 weeks Medical Rehab Prognosis:  Excellent Assessment: The patient has been admitted for CIR therapies with the diagnosis of bifrontal meningiomas s/p resection with resultant tetraplegia. The team will be addressing functional mobility, strength, stamina, balance, safety, adaptive techniques and equipment, self-care, bowel and bladder mgt, patient and caregiver education, NMR, cognition, communication, spasticity mgt, language, community reentry,  w/c assessment. Goals have been set at mod to max assist with mobility and supervision to mod I with cognition.   Due to the current state of emergency, patients may not be receiving their 3 hours per day of Medicare-mandated therapy.    [name], MD, FAAPMR      See Team Conference Notes for weekly updates to the plan of care

## 2020-05-05 NOTE — Progress Notes (Signed)
ANTICOAGULATION CONSULT NOTE   Pharmacy Consult for Heparin Indication: DVT  No Known Allergies  Patient Measurements: Weight: 133.5 kg (294 lb 5 oz) Heparin Dosing Weight: 109.3 kg  Vital Signs: Temp: 98.9 F (37.2 C) (04/18 0509) Temp Source: Oral (04/18 0509) BP: 140/68 (04/18 0509) Pulse Rate: 79 (04/18 0509)  Labs: Recent Labs    05/02/20 1826 05/04/20 2208 05/05/20 0755  HGB 11.0*  --  12.3*  HCT 33.6*  --  36.4*  PLT 196  --  258  HEPARINUNFRC  --  0.51 0.83*  CREATININE 0.91  --  0.83    Estimated Creatinine Clearance: 115.5 mL/min (by C-G formula based on SCr of 0.83 mg/dL).     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 On 4/12 the pt was taken for craniotomy and resection (some possible residual tumor in the patent portion of the sinus). The patient is now in CIR with a doppler showing age indeterminate DVT involving the L posterior tibial vein. Per Dr. Ranell Patrick, anticoagulation has been cleared with neurosurgery. PTA the patient is not on anticoagulation. Pharmacy is consulted to dose heparin.  Due to recent craniotomy, will not bolus heparin and target the lower end of goal to minimize risk of bleeding.  Heparin level supratherapeutic (0.83) on gtt at 1700 units/hr.  Goal of Therapy:  Heparin level 0.3-0.5 units/ml Monitor platelets by anticoagulation protocol: Yes   Plan:  Decrease IV heparin to 1500 units/hr F/u 8 hr heparin level  Mahala Rommel A. Levada Dy, PharmD, BCPS, FNKF Clinical Pharmacist [place] Please utilize Amion for appropriate phone number to reach the unit pharmacist ([place])   05/05/2020 8:58 AM

## 2020-05-05 NOTE — Progress Notes (Signed)
PROGRESS NOTE   Subjective/Complaints: Pt in bed eating his breakfast. No problems this morning. Appears comfortable.   ROS: Limited due to cognitive/behavioral    Objective:   VAS [place] LOWER EXTREMITY VENOUS (DVT)  Result Date: 05/04/2020  Lower Venous DVT Study Indications: Swelling.  Risk Factors: Surgery 04-29-2020 LT craniectomy w/ tumor excision. Anticoagulation: Subcutaneous heparin. Comparison Study: No prior studies. Performing Technologist: Darlin Coco RDMS,RVT  Examination Guidelines: A complete evaluation includes B-mode imaging, spectral Doppler, color Doppler, and power Doppler as needed of all accessible portions of each vessel. Bilateral testing is considered an integral part of a complete examination. Limited examinations for reoccurring indications may be performed as noted. The reflux portion of the exam is performed with the patient in reverse Trendelenburg.  +---------+---------------+---------+-----------+----------+--------------+ RIGHT    CompressibilityPhasicitySpontaneityPropertiesThrombus Aging +---------+---------------+---------+-----------+----------+--------------+ CFV      Full           Yes      Yes                                 +---------+---------------+---------+-----------+----------+--------------+ SFJ      Full                                                        +---------+---------------+---------+-----------+----------+--------------+ FV Prox  Full                                                        +---------+---------------+---------+-----------+----------+--------------+ FV Mid   Full                                                        +---------+---------------+---------+-----------+----------+--------------+ FV DistalFull                                                        +---------+---------------+---------+-----------+----------+--------------+  PFV      Full                                                        +---------+---------------+---------+-----------+----------+--------------+ POP      Full           Yes      Yes                                 +---------+---------------+---------+-----------+----------+--------------+  PTV      Full                                                        +---------+---------------+---------+-----------+----------+--------------+ PERO     Full                                                        +---------+---------------+---------+-----------+----------+--------------+   +---------+---------------+---------+-----------+----------+-------------------+ LEFT     CompressibilityPhasicitySpontaneityPropertiesThrombus Aging      +---------+---------------+---------+-----------+----------+-------------------+ CFV      Full           Yes      Yes                                      +---------+---------------+---------+-----------+----------+-------------------+ SFJ      Full                                                             +---------+---------------+---------+-----------+----------+-------------------+ FV Prox  Full                                                             +---------+---------------+---------+-----------+----------+-------------------+ FV Mid   Full                                                             +---------+---------------+---------+-----------+----------+-------------------+ FV DistalFull                                                             +---------+---------------+---------+-----------+----------+-------------------+ PFV      Full                                                             +---------+---------------+---------+-----------+----------+-------------------+ POP      Full           Yes      Yes                  Age Indeterminate    +---------+---------------+---------+-----------+----------+-------------------+ PTV      None           No  No                                       +---------+---------------+---------+-----------+----------+-------------------+ PERO                                                  Not well visualized +---------+---------------+---------+-----------+----------+-------------------+     Summary: RIGHT: - There is no evidence of deep vein thrombosis in the lower extremity.  - No cystic structure found in the popliteal fossa.  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  - A cystic structure is found in the popliteal fossa.  *See table(s) above for measurements and observations. Electronically signed by Servando Snare MD on 05/04/2020 at 1:58:34 PM.    Final    Recent Labs    05/02/20 1826 05/05/20 0755  WBC 9.9 12.4*  HGB 11.0* 12.3*  HCT 33.6* 36.4*  PLT 196 258   Recent Labs    05/02/20 1826 05/05/20 0755  NA  --  139  K  --  4.1  CL  --  106  CO2  --  26  GLUCOSE  --  127*  BUN  --  16  CREATININE 0.91 0.83  CALCIUM  --  9.1    Intake/Output Summary (Last 24 hours) at 05/05/2020 1043 Last data filed at 05/05/2020 0835 Gross per 24 hour  Intake 404 ml  Output 2050 ml  Net -1646 ml        Physical Exam: Vital Signs Blood pressure 140/68, pulse 79, temperature 98.9 F (37.2 C), temperature source Oral, resp. rate 16, weight 133.5 kg, SpO2 97 %. Constitutional: No distress . Vital signs reviewed. HEENT: EOMI, oral membranes moist Neck: supple Cardiovascular: RRR without murmur. No JVD    Respiratory/Chest: CTA Bilaterally without wheezes or rales. Normal effort    GI/Abdomen: BS +, non-tender, non-distended Ext: no clubbing, cyanosis, or edema Psych: pleasant and cooperative Skin: No evidence of breakdown, no evidence of rash, crani scar with scab Neurologic: Cranial nerves II through  XII intact, motor strength is 5/5 in Left and 0/5 right deltoid, tricep, grip,trace-1/5 bicep. tr/5 bilateral hip flexor, knee extensors, ankle dorsiflexor and plantar flexor.  Sensory exam normal sensation to light touch and proprioception in bilateral upper and lower extremities Cerebellar exam normal finger to nose to fingerLeft upper Musculoskeletal:limited ROM with knee flexion and hip flexion. No joint swelling Increased hip ext tone still present   Assessment/Plan: 1. Functional deficits which require 3+ hours per day of interdisciplinary therapy in a comprehensive inpatient rehab setting.  Physiatrist is providing close team supervision and 24 hour management of active medical problems listed below.  Physiatrist and rehab team continue to assess barriers to discharge/monitor patient progress toward functional and medical goals  Care Tool:  Bathing    Body parts bathed by patient: Left arm,Chest,Abdomen,Face   Body parts bathed by helper: Right arm,Front perineal area,Buttocks,Right upper leg,Left upper leg,Right lower leg,Left lower leg     Bathing assist Assist Level: 2 Helpers     Upper Body Dressing/Undressing Upper body dressing   What is the patient wearing?: Pull over shirt    Upper body assist Assist Level: Total Assistance - Patient < 25%    Lower  Body Dressing/Undressing Lower body dressing      What is the patient wearing?: Pants     Lower body assist Assist for lower body dressing: 2 Helpers     Toileting Toileting    Toileting assist Assist for toileting: 2 Helpers     Transfers Chair/bed transfer  Transfers assist  Chair/bed transfer activity did not occur: Safety/medical concerns        Locomotion Ambulation   Ambulation assist   Ambulation activity did not occur: Safety/medical concerns          Walk 10 feet activity   Assist  Walk 10 feet activity did not occur: Safety/medical concerns        Walk 50 feet  activity   Assist Walk 50 feet with 2 turns activity did not occur: Safety/medical concerns         Walk 150 feet activity   Assist Walk 150 feet activity did not occur: Safety/medical concerns         Walk 10 feet on uneven surface  activity   Assist Walk 10 feet on uneven surfaces activity did not occur: Safety/medical concerns         Wheelchair     Assist Will patient use wheelchair at discharge?: Yes (TBD but likely)             Wheelchair 50 feet with 2 turns activity    Assist    Wheelchair 50 feet with 2 turns activity did not occur: Safety/medical concerns       Wheelchair 150 feet activity     Assist  Wheelchair 150 feet activity did not occur: Safety/medical concerns       Blood pressure 140/68, pulse 79, temperature 98.9 F (37.2 C), temperature source Oral, resp. rate 16, weight 133.5 kg, SpO2 97 %.    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 Decadron taper -Patient may  Shower with cap -ELOS/Goals: 3-4 wks minA goals  -Continue CIR therapies including PT, OT, and SLP --ACTIVITY AS TOLERATED  -wean steroids? 2. Antithrombotics: -Left posterior tibial vein KDT:[id number] heparin. Vascular ultrasound positive for age indeterminate left posterior tibial vein.   -continue IV heparin   -will d/w Dr. Zada Finders re: using full anticoagulation in this scenario   -may need to consider IVCF -antiplatelet therapy: N/A 3. Post-operative pain:Well controlled. Discussing d/cing Hydrocodone and wife and patient agreeable. Tylenol PRN for pain.  4. Mood:Provide emotional support -antipsychotic agents: N/A 5. Neuropsych: This patientiscapable of making decisions on hisown behalf. 6. Skin/Wound  Care:Routine skin checks 7. Fluids/Electrolytes/Nutrition:eating well  I personally reviewed all of the patient's labs today, and lab work is within normal limits.   8. Hypertension. Lasix 20 mg daily, Norvasc 10 mg daily. Monitor with increased mobility  - per wife, was recommended for Norvasc to be d/ced given leg swelling. which was dc'ed.  -Restarted home Flomax HS which will also help with BP 9. Hyperlipidemia. Lipitor 10. BPH: increase Flomax to 0.8mg  HS 11. Urinary frequency: foley out.   -UA negative.  timed voiding himself q1H while awake with urinal.  Increased Flomax to 0.8mg  given continued symptoms and elevated BP 12. Leukocytosis. Likely d/t steroids. Afebrile. No s/s of infection   -continue to follow   LOS: 3 days A FACE TO FACE EVALUATION WAS PERFORMED  [name] 05/05/2020, 10:43 AM

## 2020-05-05 NOTE — Progress Notes (Signed)
Physical Therapy Note  Patient Details  Name: Anthony Downs MRN: [id number] Date of Birth: 04-11-1950 Today's Date: 05/05/2020    Patient on therapy hold per MD orders due to DVT, on heparin. Will attempt to make up missed time once patient is medically appropriate.    Eugen Jeansonne L Jadea Shiffer PT, DPT  05/05/2020, 8:00 AM

## 2020-05-05 NOTE — Discharge Instructions (Addendum)
Inpatient Rehab Discharge Instructions  Anthony Downs Discharge date and time: 06/17/20   Activities/Precautions/ Functional Status: Activity: As tolerated Diet: Regular Wound Care: Keep incision clean and dry. Contact Dr. Zada Finders  you develop any problems with your incision/wound--redness, swelling, increase in pain, drainage or if you develop fever or chills.    Functional status:  ___ No restrictions     ___ Walk up steps independently _X__ 24/7 supervision/assistance   ___ Walk up steps with assistance ___ Intermittent supervision/assistance  ___ Bathe/dress independently ___ Walk with walker     _X__ Bathe/dress with assistance ___ Walk Independently    ___ Shower independently ___ Walk with assistance    ___ Shower with assistance _X__ No alcohol     ___ Return to work/school ________   COMMUNITY REFERRALS UPON DISCHARGE:    Home Health:   PT     OT     ST    RN    SNA                       Agency: [facility]  BTDVV:[phone] *Please expect follow-up within 2-3 days to schedule your home visit. If you have not received follow-up, be sure to contact the branch directly.*   Medical Equipment/Items Ordered: hospital bed, power wheelchair                                                 Agency/Supplier: [facility] [phone]    Special Instructions: 1. No driving smoking or alcohol  My questions have been answered and I understand these instructions. I will adhere to these goals and the provided educational materials after my discharge from the hospital.  Patient/Caregiver Signature _______________________________ Date __________  Clinician Signature _______________________________________ Date __________  Please bring this form and your medication list with you to all your follow-up doctor's appointments.   Information on my medicine - XARELTO (rivaroxaban)  This medication education was reviewed with me or my healthcare representative as  part of my discharge preparation.    WHY WAS XARELTO PRESCRIBED FOR YOU? Xarelto was prescribed to treat blood clots that may have been found in the veins of your legs (deep vein thrombosis) or in your lungs (pulmonary embolism) and to reduce the risk of them occurring again.  What do you need to know about Xarelto? The starting dose is one 15 mg tablet taken TWICE daily with food for the FIRST 21 DAYS then on 05/26/20  the dose is changed to one 20 mg tablet taken ONCE A DAY with your evening meal.  DO NOT stop taking Xarelto without talking to the health care provider who prescribed the medication.  Refill your prescription for 20 mg tablets before you run out.  After discharge, you should have regular check-up appointments with your healthcare provider that is prescribing your Xarelto.  In the future your dose may need to be changed if your kidney function changes by a significant amount.  What do you do if you miss a dose? If you are taking Xarelto TWICE DAILY and you miss a dose, take it as soon as you remember. You may take two 15 mg tablets (total 30 mg) at the same time then resume your regularly scheduled 15 mg twice daily the next day.  If you are taking Xarelto ONCE DAILY and you miss  a dose, take it as soon as you remember on the same day then continue your regularly scheduled once daily regimen the next day. Do not take two doses of Xarelto at the same time.   Important Safety Information Xarelto is a blood thinner medicine that can cause bleeding. You should call your healthcare provider right away if you experience any of the following: ? Bleeding from an injury or your nose that does not stop. ? Unusual colored urine (red or dark brown) or unusual colored stools (red or black). ? Unusual bruising for unknown reasons. ? A serious fall or if you hit your head (even if there is no bleeding).  Some medicines may interact with Xarelto and might increase your risk of  bleeding while on Xarelto. To help avoid this, consult your healthcare provider or pharmacist prior to using any new prescription or non-prescription medications, including herbals, vitamins, non-steroidal anti-inflammatory drugs (NSAIDs) and supplements.  This website has more information on Xarelto: [email or web address]-benson.[email or web address]

## 2020-05-05 NOTE — Progress Notes (Signed)
Occupational Therapy Session Note  Patient Details  Name: Anthony Downs MRN: [id number] Date of Birth: 16-Oct-1950  Today's Date: 05/05/2020 OT Individual Time: 1030-1130 OT Individual Time Calculation (min): 60 min   Session 2: OT Individual Time: 8875-7972 OT Individual Time Calculation (min): 15 min  30 min missed d/t pt toileting   Session 3: OT Individual Time: 1445-1500 OT Individual Time Calculation (min): 15 min  Short Term Goals: Week 1:  OT Short Term Goal 1 (Week 1): Pt will roll with MAX A of 1 caregiver to decrease BOC for toileitng OT Short Term Goal 2 (Week 1): Pt will tolerate laying sidelying on R 1x per day to assist with tone management OT Short Term Goal 3 (Week 1): Pt will recall hemi dressing techniques with MOD VC OT Short Term Goal 4 (Week 1): Pt will sit EOB 10 min with mod A in prep for ADL  Skilled Therapeutic Interventions/Progress Updates:    Session 1: Pt received supine with his wife [name] present. No c/o pain and pt very motivated to participate. He completed bed mobility rolling R with heavy max A and L with total A. Need for +2 for therapist safety in the future d/t RLE tone. Shorts donned with total A. Pt was transferred into sidelying and then into sitting EOB with total A +2 assist. Remainder of session focused on static sitting balance, midline orientation, and core/paraspinals activation. Pt required fluctuating mod-max A for sitting balance overall, improved with LUE support on bed footboard. He required cueing to keep his eyes open for visual input to aid in sitting balance and midline orientation. Pt reported urge to have BM. He was transferred back to supine and assisted onto bedpan. Pt left with all needs met, wife present.   Session 2: Pt supine on bed pan. 30 min missed d/t pt attempting to have BM. Pt rolled R with max +2 A, requiring heavy facilitation to break up RLE tone especially. Total +2 A to roll L. Bed pan removed and shorts  donned. Pt completed rolling again for maximove sling placement. Hoyer used to transfer pt from bed to [employer] w/c, requiring +3 assist for line management. Pt required total A for positioning shoulders and trunk in w/c to ensure midline orientation. Pt left with PT in chair.   Session 3: Pt still in TIS requesting to return to bed. He required total A for trunk management to position sling for maximove. Hoyer used to transfer pt back to bed. He required max +2 to roll R with good use of LUE to grasp the rail. Total +2 to roll L. Pt was left supine with all needs met, bed alarm set and wife present. Discussed CLOF, tomorrow's conference and goals.    Therapy Documentation Precautions:  Precautions Precautions: Fall,Other (comment) Precaution Comments: Strong BLE extensor tone, hx of R hand tremor Restrictions Weight Bearing Restrictions: No  Therapy/Group: Individual Therapy  Curtis Sites 05/05/2020, 12:44 PM

## 2020-05-05 NOTE — Progress Notes (Signed)
Physical Therapy Session Note  Patient Details  Name: Anthony Downs MRN: [id number] Date of Birth: 10-04-1950  Today's Date: 05/05/2020 PT Individual Time: 1345-1400 PT Individual Time Calculation (min): 15 min   Short Term Goals: Week 1:  PT Short Term Goal 1 (Week 1): Pt will perform R/L rolling in bed with +2 mod assist using bed features PT Short Term Goal 2 (Week 1): Pt will tolerate sitting EOB for at least 15 minutes during therapy PT Short Term Goal 3 (Week 1): Pt will maintain dynamic sitting balance EOM with max assist for at least 3 minutes PT Short Term Goal 4 (Week 1): Pt will perform supine<>sitting with +2 max assist  Skilled Therapeutic Interventions/Progress Updates:     Patient in TIS w/c handed off from [place], [place] upon PT arrival. Patient alert and agreeable to PT session. Patient denied pain during session.  Assessed w/c positioning and hybrid Roho inflation, noted to have 1.5-2 inches of space between ITs and w/c seat. Performed PROM and vibration of B hip/knee flexion extension for extensor tone management. Noted B gastroc/soleus tightness, performed B prolonged DF stretch with A/P glide at the talus for improved joint ROM 2x1 min. Will discuss addition of B Night splints with MD for improved DF ROM and extensor tone management. Performed R elbow flexion/extension with mild flexor tone noted, performed prolonged elbow extension stretch x1 min. Attempted bicep/tricep activation with PNF tapping during ROM without activation, however, patient with flexor synergy during a yawn. Performed cervical retraction and extension stretch with patient in the chair due to significant forward head posture and poor head control in sitting. Patient reported feeling comfortable and safe in the TIS w/c. Discussed with NT about using Maxi-sky +2 to return the patient to bed, NT in agreement.   Patient in [place] w/c with his wife in the room at end of session with breaks locked, door open for  staff to keep an eye on the patient in the chair, and all needs within reach.    Therapy Documentation Precautions:  Precautions Precautions: Fall,Other (comment) Precaution Comments: Strong BLE extensor tone, hx of R hand tremor Restrictions Weight Bearing Restrictions: No   Therapy/Group: Individual Therapy  Kaenan Jake L Ahliyah Nienow PT, DPT  05/05/2020, 4:50 PM

## 2020-05-06 ENCOUNTER — Other Ambulatory Visit (HOSPITAL_COMMUNITY): Payer: Self-pay

## 2020-05-06 MED ORDER — DEXAMETHASONE 2 MG PO TABS
2.0000 mg | ORAL_TABLET | Freq: Two times a day (BID) | ORAL | Status: AC
  Administered 2020-05-06 – 2020-05-08 (×5): 2 mg via ORAL
  Filled 2020-05-06 (×5): qty 1

## 2020-05-06 MED ORDER — SORBITOL 70 % SOLN
60.0000 mL | Status: AC
  Administered 2020-05-06: 60 mL via ORAL
  Filled 2020-05-06: qty 60

## 2020-05-06 MED ORDER — BISACODYL 10 MG RE SUPP
10.0000 mg | Freq: Every day | RECTAL | Status: DC | PRN
  Filled 2020-05-06: qty 1

## 2020-05-06 MED ORDER — POLYETHYLENE GLYCOL 3350 17 G PO PACK
17.0000 g | PACK | Freq: Two times a day (BID) | ORAL | Status: DC | PRN
  Administered 2020-05-06: 17 g via ORAL
  Filled 2020-05-06: qty 1

## 2020-05-06 NOTE — Progress Notes (Signed)
Soap suds enema given per orders.

## 2020-05-06 NOTE — Progress Notes (Signed)
PROGRESS NOTE   Subjective/Complaints: Up in bed finishing breakfast, struggling with pruritus on back d/t being hot, struggling to re-position.   ROS: Limited due to cognitive/behavioral      Objective:   No results found. Recent Labs    05/05/20 0755  WBC 12.4*  HGB 12.3*  HCT 36.4*  PLT 258   Recent Labs    05/05/20 0755  NA 139  K 4.1  CL 106  CO2 26  GLUCOSE 127*  BUN 16  CREATININE 0.83  CALCIUM 9.1    Intake/Output Summary (Last 24 hours) at 05/06/2020 1231 Last data filed at 05/06/2020 0805 Gross per 24 hour  Intake 660 ml  Output 700 ml  Net -40 ml        Physical Exam: Vital Signs Blood pressure (!) 153/73, pulse 82, temperature 98.5 F (36.9 C), resp. rate 17, height 6' (1.829 m), weight 133.5 kg, SpO2 95 %. Constitutional: No distress . Vital signs reviewed. HEENT: EOMI, oral membranes moist Neck: supple Cardiovascular: RRR without murmur. No JVD    Respiratory/Chest: CTA Bilaterally without wheezes or rales. Normal effort    GI/Abdomen: BS +, non-tender, non-distended Ext: no clubbing, cyanosis, or edema Psych: pleasant and cooperative Skin: No evidence of breakdown, no evidence of rash, crani scar with scab. Back warm, sl rash. Neurologic: Cranial nerves II through XII intact, motor strength is 5/5 in Left and 0/5 right deltoid, tricep, grip,trace-1/5 bicep. tr/5 bilateral hip flexor, knee extensors, ankle dorsiflexor and plantar flexor. No changes today Sensory exam normal sensation to light touch and proprioception in bilateral upper and lower extremities. Musculoskeletal:limited ROM with knee flexion and hip flexion. No joint swelling Increased hip ext tone still present   Assessment/Plan: 1. Functional deficits which require 3+ hours per day of interdisciplinary therapy in a comprehensive inpatient rehab setting.  Physiatrist is providing close team supervision and 24 hour  management of active medical problems listed below.  Physiatrist and rehab team continue to assess barriers to discharge/monitor patient progress toward functional and medical goals  Care Tool:  Bathing    Body parts bathed by patient: Left arm,Chest,Abdomen,Face   Body parts bathed by helper: Right arm,Front perineal area,Buttocks,Right upper leg,Left upper leg,Right lower leg,Left lower leg     Bathing assist Assist Level: 2 Helpers     Upper Body Dressing/Undressing Upper body dressing   What is the patient wearing?: Pull over shirt    Upper body assist Assist Level: Total Assistance - Patient < 25%    Lower Body Dressing/Undressing Lower body dressing      What is the patient wearing?: Pants     Lower body assist Assist for lower body dressing: 2 Helpers     Toileting Toileting    Toileting assist Assist for toileting: 2 Helpers     Transfers Chair/bed transfer  Transfers assist  Chair/bed transfer activity did not occur: Safety/medical concerns        Locomotion Ambulation   Ambulation assist   Ambulation activity did not occur: Safety/medical concerns          Walk 10 feet activity   Assist  Walk 10 feet activity did not occur: Safety/medical concerns  Walk 50 feet activity   Assist Walk 50 feet with 2 turns activity did not occur: Safety/medical concerns         Walk 150 feet activity   Assist Walk 150 feet activity did not occur: Safety/medical concerns         Walk 10 feet on uneven surface  activity   Assist Walk 10 feet on uneven surfaces activity did not occur: Safety/medical concerns         Wheelchair     Assist Will patient use wheelchair at discharge?: Yes (TBD but likely)             Wheelchair 50 feet with 2 turns activity    Assist    Wheelchair 50 feet with 2 turns activity did not occur: Safety/medical concerns       Wheelchair 150 feet activity     Assist   Wheelchair 150 feet activity did not occur: Safety/medical concerns       Blood pressure (!) 153/73, pulse 82, temperature 98.5 F (36.9 C), resp. rate 17, height 6' (1.829 m), weight 133.5 kg, SpO2 95 %.    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 Decadron taper -Patient may  Shower with cap -ELOS/Goals: 3-4 wks minA goals  -Continue CIR therapies including PT, OT, and SLP --ACTIVITY AS TOLERATED  -begin decadron taper to off 2. Antithrombotics: -Left posterior tibial vein DVT:  Vascular ultrasound positive for age indeterminate left posterior tibial vein.   -discussed with Dr. Zada Finders yesterday who ok'd anticoagulation   -continue xarelto -antiplatelet therapy: N/A 3. Post-operative pain:Well controlled. Discussing d/cing Hydrocodone and wife and patient agreeable. Tylenol PRN for pain.  4. Mood:Provide emotional support -antipsychotic agents: N/A 5. Neuropsych: This patientiscapable of making decisions on hisown behalf. 6. Skin/Wound Care:hydrocortisone cream   -double sheet bed   -turn in bed 7. Fluids/Electrolytes/Nutrition:eating well     8. Hypertension. Lasix 20 mg daily, Norvasc 10 mg daily. Monitor with increased mobility  - per wife, was recommended for Norvasc to be d/ced given leg swelling. which was dc'ed.  -Restarted home Flomax HS which will also help with BP 9. Hyperlipidemia. Lipitor 10. BPH: increased Flomax to 0.8mg  HS 11. Urinary frequency: foley out.   -UA negative.  timed voiding himself q1H while awake with urinal.  Increased Flomax to 0.8mg  given continued symptoms and elevated BP 12. Leukocytosis. Likely d/t steroids. Afebrile. No s/s of infection   -continue to follow   LOS: 4 days A FACE TO [place] 05/06/2020, 12:31 PM

## 2020-05-06 NOTE — Progress Notes (Signed)
Physical Therapy Session Note  Patient Details  Name: Anthony Downs MRN: [id number] Date of Birth: [birth year]  Today's Date: 05/06/2020 PT Individual Time: 1035-1050 PT Individual Time Calculation (min): 15 min   Short Term Goals: Week 1:  PT Short Term Goal 1 (Week 1): Pt will perform R/L rolling in bed with +2 mod assist using bed features PT Short Term Goal 2 (Week 1): Pt will tolerate sitting EOB for at least 15 minutes during therapy PT Short Term Goal 3 (Week 1): Pt will maintain dynamic sitting balance EOM with max assist for at least 3 minutes PT Short Term Goal 4 (Week 1): Pt will perform supine<>sitting with +2 max assist  Skilled Therapeutic Interventions/Progress Updates:     Patient in bed asleep upon PT arrival. Patient aroused to verbal stimulation and agreeable to PT session, however reporting increased fatigue due to lack of sleep last night and improved comfort from previous OT/PT session. Also reports not sleeping x3 nights due to discomfort. Attempted to discussed sleeping habits, but patient closed his eyes intermittently and had difficulty addressing questions due to fatigue and communication deficits.   Initiated R lower extremity extensor tone interventions. Able to reduce tone with less time using vibration and knee flexion with DF this session. Patient suddenly pushed into full extension without stimulus and stated "I did that!" PT attempted to replicate, but patient continued to close his eyes and was unable to attend to the task.   Decided  to end session due to patient's fatigue level as he was unable to maintain arousal and attend to functional interventions at this time. Patient missed 45 min of skilled PT due to fatigue, RN made aware. Will attempt to make-up missed time as able.    Patient in bed asleep at end of session with breaks locked, bed alarm set, and all needs within reach.   Therapy Documentation Precautions:  Precautions Precautions:  Fall,Other (comment) Precaution Comments: Strong BLE extensor tone, hx of R hand tremor Restrictions Weight Bearing Restrictions: No General: PT Amount of Missed Time (min): 45 Minutes PT Missed Treatment Reason: Patient fatigue (poor sleep last night, unable to keep his eyes open)   Therapy/Group: Individual Therapy  Anthony Downs PT, DPT  05/06/2020, 11:37 AM

## 2020-05-06 NOTE — Progress Notes (Signed)
Occupational Therapy Session Note  Patient Details  Name: Anthony Downs MRN: [id number] Date of Birth: 1950-10-16  Today's Date: 05/06/2020 OT Co-Treatment Time: 0930-1000 OT Co-Treatment Time Calculation (min): 30 min   Short Term Goals: Week 1:  OT Short Term Goal 1 (Week 1): Pt will roll with MAX A of 1 caregiver to decrease BOC for toileitng OT Short Term Goal 2 (Week 1): Pt will tolerate laying sidelying on R 1x per day to assist with tone management OT Short Term Goal 3 (Week 1): Pt will recall hemi dressing techniques with MOD VC OT Short Term Goal 4 (Week 1): Pt will sit EOB 10 min with mod A in prep for ADL  Skilled Therapeutic Interventions/Progress Updates:    Co-tx with PT. OT addressed sitting balance, bed mobility, and UB dressing. Pt received supine with c/o pain in his back from excessive sweating and itching. Pt was transferred to EOB with max +2-3 assist. He required cueing for midline orientation, upright posture, and UE positioning. After several minutes of cueing and mod A pt was able to maintain sitting balance for ~1 min at supervision level! Pt donned a new shirt with cueing for hemi technique with max A. Barrier cream applied to pt's back per RN suggestion. Pt returned to supine with total A+2 and was left supine with all needs met, wife present.   Therapy Documentation Precautions:  Precautions Precautions: Fall,Other (comment) Precaution Comments: Strong BLE extensor tone, hx of R hand tremor Restrictions Weight Bearing Restrictions: No   Therapy/Group: Individual Therapy  Curtis Sites 05/06/2020, 6:32 AM

## 2020-05-06 NOTE — Progress Notes (Signed)
Speech Language Pathology Daily Session Note  Patient Details  Name: Anthony Downs MRN: [id number] Date of Birth: March 20, 1950  Today's Date: 05/06/2020 SLP Individual Time: 1440-1510 SLP Individual Time Calculation (min): 30 min and Today's Date: 05/06/2020 SLP Missed Time: 15 Minutes Missed Time Reason: Patient fatigue  Short Term Goals: Week 1: SLP Short Term Goal 1 (Week 1): Pt will recall novel/functional information with and without delay provided Min A verbal cues SLP Short Term Goal 2 (Week 1): Pt will complete mildly complex problem solving and executive function tasks provided min A verbal cues SLP Short Term Goal 3 (Week 1): Pt will participate in verbal fluency tasks (divergent naming, etc) with min A verbal cues SLP Short Term Goal 4 (Week 1): Pt will understand, recall and utilize strategies to reduce frustration and improve communication at simple conversation level provided min A verbal cues  Skilled Therapeutic Interventions: Skilled treatment session focused on cognitive-linguistic goals. SLP attempted to see patient during scheduled session right after PT but patient requested to rest due to fatigue. SLP re-attempted later in the afternoon and patient was agreeable to participate. Patient initially utilized 1-2 word utterances for communication with extra time needed for word-finding. However, as session continued, patient's overall verbal fluency improved and patient was having a conversation about his family, home, etc with intermittent word substitutions with intermittent awareness. Patient left upright in bed with family present. Continue with current plan of care.      Pain No/Denies Pain   Therapy/Group: Individual Therapy  [name] 05/06/2020, 3:37 PM

## 2020-05-06 NOTE — Progress Notes (Signed)
Patient ID: Anthony Downs, male   DOB: 13-Feb-1950, 70 y.o.   MRN: [id number]  SW met with pt and pt cousin room. SW informed pt on his d/c date ELOS 4 weeks.   SW later called pt wife Anthony Downs [phone]) to provide updates from team conference, and ELOS 4 weeks. SW will continue to provide updates from team conference.   Loralee Pacas, MSW, [place] Office: [phone] Cell: [phone] Fax: [phone]

## 2020-05-06 NOTE — Progress Notes (Signed)
Physical Therapy Session Note  Patient Details  Name: Anthony Downs MRN: [id number] Date of Birth: [birth year]  Today's Date: 05/06/2020 PT Individual Time: 8242-3536 PT Individual Time Calculation (min): 40 min   Short Term Goals: Week 1:  PT Short Term Goal 1 (Week 1): Pt will perform R/L rolling in bed with +2 mod assist using bed features PT Short Term Goal 2 (Week 1): Pt will tolerate sitting EOB for at least 15 minutes during therapy PT Short Term Goal 3 (Week 1): Pt will maintain dynamic sitting balance EOM with max assist for at least 3 minutes PT Short Term Goal 4 (Week 1): Pt will perform supine<>sitting with +2 max assist  Skilled Therapeutic Interventions/Progress Updates:    Pt received supine in bed and agreeable to therapy session though reporting significant abdominal pain/discomfort due to not having a BM in several days. Pt reports he declines eating anything until he finally has a BM. Therapist provided emotional support, distraction, and repositioning for pain management. Supine B LE stretching into hip/knee flexion for tone management with pt demoing decreased tone in L LE compared to R. Supine>L sidelying with +2 max assist, facilitation for B LE placement into hooklying to promote rolling. L sidelying>sitting EOB with +2 total assist for B LE management and trunk upright - cuing for pt to use L UE to assist with trunk upright. Pt participated in sitting EOB at least 67minutes during session working on sitting balance/trunk control. Pt noted to have repeated R lateral LOB with primarily anterior bias but intermittent posterior bias - continues to demo lack of recruitment for righting reactions despite attempts at cuing to use head/cervical movement to initiate trunk movement - noted best righting response when providing cue to reach L UE laterally towards pillow with pt able to sustain that position for ~20 seconds prior to requiring assistance due to gradual R lean.  Participated in Function in Sitting Test (FIST) with score of 10/56 indicating significant sitting balance impairments. Pt requesting to return to supine due to fatigue and continued abdominal discomfort. Sit>supine +2 total assist, cuing for pt to use L UE to assist with trunk descent. Supine scoot towards HOB with +2 total assist. Pt agreeable to repositioning onto L side for pressure relief and tone management - +2 max assist for rolling. Therapeutically positioned patients with pillows to support R hemibody. Pt left L sidelying in bed with needs in reach and as hand-off to [place], [place].   Function In Sitting Test (FIST)  (1/2 femur on surface; hips/knees flexed to 90deg)   - indicate bed or mat table / step stool if used  SCORING KEY: 4 = Independent (completes task independently & successfully) 3 = Verbal Cues/Increased Time (completes task independently & successfully and only needs more time/cues) 2 = Upper Extremity Support (must use UE for support or assistance to complete successfully) 1 = Needs Assistance (unable to complete w/o physical assist; DOCUMENT LEVEL: min, mod, max) 0 = Dependent (requires complete physical assist; unable to complete successfully even w/ physical assist)  Randomly Administer Once Throughout Exam  1 - Anterior Nudge (superior sternum), total assist 1 - Posterior Nudge (between scapular spines), total assist 1 - Lateral Nudge (to dominant side at acromion), total assist    1 - Static sitting (30 seconds), max assist 1 - Sitting, shake 'no' (left and right), max assist 1 - Sitting, eyes closed (30 seconds), max assist 0 - Sitting, lift foot (dominant side, lift foot 1 inch twice), unable to  lift LEs at this time   1 - Pick up object from behind (object at midline, hands breadth posterior), total assist 1 - Forward reach (use dominant arm, must complete full motion), total assist 1 - Lateral reach (use dominant arm, clear opposite ischial tuberosity),  total assist, able to reach laterally but unable to activate movement down through trunk to lift pelvis 1 - Pick up object from floor (from between feet), total assist  0 - Posterior scooting (move backwards 2 inches), unable despite +2 physical assist 0 - Anterior scooting (move forward 2 inches), unable despite +2 physical assist 0 - Lateral scooting (move to dominant side 2 inches), unable despite +2 physical assist   TOTAL = 10/56  MCD > 5 points MCID for IP REHAB > 6 points       Therapy Documentation Precautions:  Precautions Precautions: Fall,Other (comment) Precaution Comments: Strong BLE extensor tone, hx of R hand tremor Restrictions Weight Bearing Restrictions: No  Pain:   Repeatedly states he is having abdominal pain/discomfort due to needing to have BM - details above.    Therapy/Group: Individual Therapy  Tawana Scale , PT, DPT, CSRS  05/06/2020, 6:27 PM

## 2020-05-06 NOTE — Progress Notes (Signed)
Physical Therapy Session Note  Patient Details  Name: Anthony Downs MRN: [id number] Date of Birth: [birth year]  Today's Date: 05/06/2020 PT Co-Treatment Time: 0930-1000 PT Co-Treatment Time Calculation (min): 30 min  Short Term Goals: Week 1:  PT Short Term Goal 1 (Week 1): Pt will perform R/L rolling in bed with +2 mod assist using bed features PT Short Term Goal 2 (Week 1): Pt will tolerate sitting EOB for at least 15 minutes during therapy PT Short Term Goal 3 (Week 1): Pt will maintain dynamic sitting balance EOM with max assist for at least 3 minutes PT Short Term Goal 4 (Week 1): Pt will perform supine<>sitting with +2 max assist  Skilled Therapeutic Interventions/Progress Updates:     Patient in bed with his wife in the room upon PT and OT arrival for co-treatment. Patient alert and agreeable to PT session. Patient denied pain during session, however, his wife reports that he did not sleep well and has been "agitated" this morning due to back irritation and itching. Noted skin irritation on inspection with patient in sitting, RN made aware.   PT focused on R lower extremity extensor tone management, bed mobility, midline orientation, and sitting balance throughout session.  Therapeutic Activity: Bed Mobility: Patient performed supine to/from sit with total A +3. Provided facilitation for bending B lower extremities, following extensor tone management, see NMR below, cues patient to look the direction he was rolling and use L hand to assist using the rail, set L elbow in L side-lying and cues patient to assist with pushing up to his elbow then his hand with facilitation from therapist. Reversed cues and sequencing for returning to lying and facilitated B knee to chest to lift his legs onto the bed to prevent extensor tone. Performed reciprocal scooting of L hip back on EOB with total A +2 facilitating head-hips relationship and posterior rotation of L hip.  Neuromuscular  Re-ed: Patient performed the following activities: -R lower extremity vibration while facilitating knee flexion and DF to break extensor tone in supine, progressed to passive hip/knee flexion/extension ROM until able to place patient in hook-lying position -Sitting balance EOB focused on midline orientation during skin care, upper body dressing, and sheet change with OT, provided cues for chin tuck to initiate bringing his trunk forward for reduced posterior bias, use of L hand by hip to push out of L lean and on OT shoulder in front of him to reduce pushing to the R when hand was on his knee. Performed lateral lean to L elbow and pushing up with mod A +2 to facilitate x1 with improved midline orientation after. Progressed from max A +2 to min A +1 and even supervision 5-10 sec over >15 min sitting EOB.  Patient in bed with his wife at bedside at end of session with breaks locked, bed alarm set, and all needs within reach. Patient reported improved back itching and increased comfort in lying at end of session.   Therapy Documentation Precautions:  Precautions Precautions: Fall,Other (comment) Precaution Comments: Strong BLE extensor tone, hx of R hand tremor Restrictions Weight Bearing Restrictions: No   Therapy/Group: Individual Therapy  Catori Panozzo L Byron Tipping PT, DPT  05/06/2020, 10:32 AM

## 2020-05-06 NOTE — Patient Care Conference (Signed)
Inpatient RehabilitationTeam Conference and Plan of Care Update Date: 05/06/2020   Time: 10:25 AM    Patient Name: Anthony Downs      Medical Record Number: [id number]  Date of Birth: 1950/05/22 Sex: Male         Room/Bed: 4W18C/4W18C-01 Payor Info: Payor: MEDICARE / Plan: MEDICARE PART A AND B / Product Type: *No Product type* /    Admit Date/Time:  05/02/2020  5:12 PM  Primary Diagnosis:  Brain tumor [facility])  Hospital Problems: Principal Problem:   Brain tumor [facility]) Active Problems:   Meningioma [facility])    Expected Discharge Date: Expected Discharge Date:  (4 weeks)  Team Members Present: Physician leading conference: Dr. Alger Simons Care Coodinator Present: Loralee Pacas, LCSWA;Altan Kraai Creig Hines, RN, BSN, CRRN Nurse Present: Dorthula Nettles, RN PT Present: Apolinar Junes, PT OT Present: Laverle Hobby, OT SLP Present: Weston Anna, SLP PPS Coordinator present : Gunnar Fusi, SLP     Current Status/Progress Goal Weekly Team Focus  Bowel/Bladder             Swallow/Nutrition/ Hydration   Pt is continent x2.  Pt will reamin continent x2.  Assess q shift and prn.   ADL's   Mod-max A +2 for all ADLs. RUE with synergistic movements  Mod-max A overall  ADL retraining, ADL transfers, RUE NMR   Mobility   Total A +2 overall, some use of L upper extremity, B lower extremtiy extensor tone, otherwise no volitional activation noted  Mod A overall, gait 25 ft max A  Activity tolerance, NMR of extremities and trunk control, tone management, sitting tolerance, functional mobility   Communication   Supervision-Min A  Mod I  verbal fluency-especially during dual tasks   Safety/Cognition/ Behavioral Observations  Min A  Supervision  recall, attention, complex problem solving   Pain   Pt denies pain.  Pain will remain <3.  Assess q shift and prn.   Skin   Pt has head/back incision from craniotomy with staples.  Skin will continue to heal and remain infection free.   Assess q shift and prn.     Discharge Planning:  Pt to d/c to home with 24/7 care from wife.   Team Discussion: Patient is emptying bladder, has good appetite, has a left post-tibia DVT. He has bi-frontal meningiomas with tetraplegia, with the right side > left side. Nursing reports patient is wearing a condom catheter, has not had a bowel movement since 04/27/20, MD and PA aware and are giving appropriate medications. No complaints of pain, sleep issues, incisions to the back of head is clean and intact. Educating on anxiety reduction, medication management, wound care, and Xarelto. Social work reports wife will be the only caregiver. Patient on target to meet rehab goals: yes, patient is a heavy max/total assist +2 for ADL's at bed level. He has mod/max assist goals. Making progress with sitting balance. He has a rash to to his back and barrier cream is being applied along with a cotton t-shirt. SLP is working on higher level cognition, recall, and attention. He has supervision to mod I goals.  *See Care Plan and progress notes for long and short-term goals.   Revisions to Treatment Plan:  Working on constipation and bowel medications.  Teaching Needs: Family education, medication management, pain management, constipation management, skin/wound care, transfer training, gait training, balance training, endurance training, sitting balance, safety awareness.  Current Barriers to Discharge: Decreased caregiver support, Medical stability, Home enviroment access/layout, Wound care, Lack of/limited family support,  Weight, Medication compliance and Behavior  Possible Resolutions to Barriers: Continue current medications, continue education on bowel medications and constipation, provide emotional support.     Medical Summary Current Status: bi-frontal meningiomas with tetraplegia, Rigt>Left. apraxia/motor planning issues.  emptying bladder, good appetite.  left post-tibial dvt  Barriers to  Discharge: Medical stability   Possible Resolutions to Barriers/Weekly Focus: maximize motor function, daily assessment of labs and pt data. rx of dvt   Continued Need for Acute Rehabilitation Level of Care: The patient requires daily medical management by a physician with specialized training in physical medicine and rehabilitation for the following reasons: Direction of a multidisciplinary physical rehabilitation program to maximize functional independence : Yes Medical management of patient stability for increased activity during participation in an intensive rehabilitation regime.: Yes Analysis of laboratory values and/or radiology reports with any subsequent need for medication adjustment and/or medical intervention. : Yes   I attest that I was present, lead the team conference, and concur with the assessment and plan of the team.   Cristi Loron 05/06/2020, 2:57 PM

## 2020-05-07 MED ORDER — HYDROCORTISONE 1 % EX CREA
TOPICAL_CREAM | Freq: Three times a day (TID) | CUTANEOUS | Status: DC
  Administered 2020-05-16 – 2020-06-08 (×10): 1 via TOPICAL
  Filled 2020-05-07 (×7): qty 28

## 2020-05-07 NOTE — Progress Notes (Signed)
Physical Therapy Session Note  Patient Details  Name: Anthony Downs MRN: [id number] Date of Birth: 01/09/1951  Today's Date: 05/07/2020 PT Individual Time: 0800-0900 PT Individual Time Calculation (min): 60 min   Short Term Goals: Week 1:  PT Short Term Goal 1 (Week 1): Pt will perform R/L rolling in bed with +2 mod assist using bed features PT Short Term Goal 2 (Week 1): Pt will tolerate sitting EOB for at least 15 minutes during therapy PT Short Term Goal 3 (Week 1): Pt will maintain dynamic sitting balance EOM with max assist for at least 3 minutes PT Short Term Goal 4 (Week 1): Pt will perform supine<>sitting with +2 max assist  Skilled Therapeutic Interventions/Progress Updates:     Patient in bed getting set-up to eat with NT and his wife at bedside upon PT arrival. Patient alert and agreeable to PT session. Patient denied pain during session.  While patient ate breakfast performed prolonged B DF stretch. Patient with reduced DF ROM on R and To neutral on L. Donned pants with total A and shirt with max A with patient using R hand with cues for hemi-technique.   Therapeutic Activity: Bed Mobility: Patient performed rolling R/L with total A +2. Maintained bed with knees bent and feet down to prevent extensor tone with improved ability to move patient through PROM to hook-lying. Patient used his R arm and turning his head to assist with rolling. Removed catheter, as it was attached to the suction unit on the wall, RN aware. Donned brief and shorts during rolling. Patient performed seim-reclined to long sit, with knees ben and feet dropped to reduced hamstring stretch wit total a +2 x2 to don his shirt. Provided verbal cues for use of forward head motion and chin tuck to assist with forward trunk motion to come to sitting. Transfers:  Patient performed dependent transfers using the [place] bed<>TIS w/c and TIS w/c <>tilt table.  Tilt Table used for improved vertical tolerance and  weight bearing through lower extremities. 0 deg: BP 140/75, HR 69 20 deg: BP 137/76, HR 77 40 deg: BP 124/77, HR 87 40 deg x5 min: BP 125/72, HR 85 Patient asymptomatic throughout. Patient unable to actively extend either lower extremities and no increased tone noted, able to passively bend each knee with weight bearing. Patient did report significant, but tolerable, gastroc stretch with weight bearing. Performed R arm reaching, able to perform through functional AROM at the shoulder, but unable to bring R trunk forward.   Patient in bed with his wife at bedside, reported having a BM on return to the bed, RN made aware at end of session with breaks locked, bed alarm set, and all needs within reach.    Therapy Documentation Precautions:  Precautions Precautions: Fall,Other (comment) Precaution Comments: Strong BLE extensor tone, hx of R hand tremor Restrictions Weight Bearing Restrictions: No   Therapy/Group: Individual Therapy  Talyn Dessert L Amelda Hapke PT, DPT  05/07/2020, 12:24 PM

## 2020-05-07 NOTE — Progress Notes (Signed)
Occupational Therapy Session Note  Patient Details  Name: Anthony Downs MRN: [id number] Date of Birth: July 24, 1950  Today's Date: 05/07/2020 OT Individual Time: 1300-1400 OT Individual Time Calculation (min): 60 min    Short Term Goals: Week 1:  OT Short Term Goal 1 (Week 1): Pt will roll with MAX A of 1 caregiver to decrease BOC for toileitng OT Short Term Goal 2 (Week 1): Pt will tolerate laying sidelying on R 1x per day to assist with tone management OT Short Term Goal 3 (Week 1): Pt will recall hemi dressing techniques with MOD VC OT Short Term Goal 4 (Week 1): Pt will sit EOB 10 min with mod A in prep for ADL  Skilled Therapeutic Interventions/Progress Updates:    Pt received supine with no c/o pain. Pt unaware of incontinent BM. Pt completed rolling R with heavy assist to flex RLE but only mod A to roll with pt able to reach to L bedrail! Total A +2 for clothing management and peri care. RLE extensor tone improved this session. Pt completed rolling again for maximove hoyer sling placement. He was transferred to his TIS w/c via hoyer. Total A +2 for positioning of hips via reciprocal scooting to ensure safe seating. Pt completed UB bathing at the sink with max A, pt able to wash under RUE with total A required for lifting. He donned new shirt with mod A with cueing for hemi technique. Problem solved through foot rest options d/t staff being unable to locate a L footrest made for the w/c. Pt was left sitting up in the w/c with all needs met.   BP supine: 159/72 BP in chair: 148/74  Therapy Documentation Precautions:  Precautions Precautions: Fall,Other (comment) Precaution Comments: Strong BLE extensor tone, hx of R hand tremor Restrictions Weight Bearing Restrictions: No   Therapy/Group: Individual Therapy  Curtis Sites 05/07/2020, 6:37 AM

## 2020-05-07 NOTE — Progress Notes (Signed)
PIV removed per pt request. Pt has no known IV therapies at this time. Pt tolerated well. Sheela Stack, LPN

## 2020-05-07 NOTE — Progress Notes (Signed)
Orthopedic Tech Progress Note Patient Details:  Anthony Downs 06-Aug-1950 [id number] Ordered bilateral night splints Patient ID: Anthony Downs, male   DOB: 1950/12/19, 70 y.o.   MRN: [id number]   Tammy Sours 05/07/2020, 6:22 AM

## 2020-05-07 NOTE — Progress Notes (Signed)
Speech Language Pathology Daily Session Note  Patient Details  Name: DARRIAN GRZELAK MRN: [id number] Date of Birth: Jul 26, 1950  Today's Date: 05/07/2020 SLP Individual Time: 1430-1500 SLP Individual Time Calculation (min): 30 min  Short Term Goals: Week 1: SLP Short Term Goal 1 (Week 1): Pt will recall novel/functional information with and without delay provided Min A verbal cues SLP Short Term Goal 2 (Week 1): Pt will complete mildly complex problem solving and executive function tasks provided min A verbal cues SLP Short Term Goal 3 (Week 1): Pt will participate in verbal fluency tasks (divergent naming, etc) with min A verbal cues SLP Short Term Goal 4 (Week 1): Pt will understand, recall and utilize strategies to reduce frustration and improve communication at simple conversation level provided min A verbal cues  Skilled Therapeutic Interventions: Skilled treatment session focused on cognitive-linguistic goals. SLP facilitated session by providing supervision level verbal cues for problem solving during a basic to mildly complex money management task. Patient participated in a functional conversation with extra time needed for verbal expression with minimal word-finding deficits noted. Patient left upright in wheelchair with all needs within reach.Continue with current plan of care.      Pain No/Denies Pain  Therapy/Group: Individual Therapy  Cohl Behrens, [place] 05/07/2020, 3:21 PM

## 2020-05-07 NOTE — Progress Notes (Signed)
Physical Therapy Session Note  Patient Details  Name: Anthony Downs MRN: [id number] Date of Birth: 02-22-1950  Today's Date: 05/07/2020 PT Individual Time: 1515-1600 PT Individual Time Calculation (min): 45 min   Short Term Goals: Week 1:  PT Short Term Goal 1 (Week 1): Pt will perform R/L rolling in bed with +2 mod assist using bed features PT Short Term Goal 2 (Week 1): Pt will tolerate sitting EOB for at least 15 minutes during therapy PT Short Term Goal 3 (Week 1): Pt will maintain dynamic sitting balance EOM with max assist for at least 3 minutes PT Short Term Goal 4 (Week 1): Pt will perform supine<>sitting with +2 max assist Week 2:    Week 3:     Skilled Therapeutic Interventions/Progress Updates:    Pain:  Pt reports no pain.  Treatment to tolerance.  Rest breaks and repositioning as needed.  Pt initially oob in wc and agreeable to treatment session.  Suction catheter disconnected and pt transported in TIS to gym.   wc to tilt table via maximove +2.  Tilt table progression/BP as follows: 20*  143/84 30*  139/87 50* 141/85 60* 741/28 Pt unable to elicit quad or glut activation w/progressive wbing.  Tolerated very well, no c/os, no OH.  Tilt table to bed +3 lateral scoot/total assist. Pt left supine w/rails up x 4, alarm set, bed in lowest position, and needs in reach.  Therapy Documentation Precautions:  Precautions Precautions: Fall,Other (comment) Precaution Comments: Strong BLE extensor tone, hx of R hand tremor Restrictions Weight Bearing Restrictions: No    Therapy/Group: Individual Therapy  Callie Fielding, PT   Jerrilyn Cairo 05/07/2020, 4:26 PM

## 2020-05-07 NOTE — Progress Notes (Signed)
Pt c/o itching to back, assessment found rash on lower and upper back. Pt c/o discomfort and itching since onset 4/19. PA Dan notified. New orders received. Sheela Stack, LPN

## 2020-05-07 NOTE — Progress Notes (Signed)
Orthopedic Tech Progress Note Patient Details:  Anthony Downs 1950/12/14 [id number] Called in order to [facility] for [name]  Patient ID: Anthony Downs, male   DOB: 03-31-1950, 70 y.o.   MRN: [id number]   Janit Pagan 05/07/2020, 8:20 AM

## 2020-05-08 MED ORDER — MELATONIN 3 MG PO TABS
3.0000 mg | ORAL_TABLET | Freq: Once | ORAL | Status: AC
  Administered 2020-05-08: 3 mg via ORAL
  Filled 2020-05-08: qty 1

## 2020-05-08 MED ORDER — BACLOFEN 10 MG PO TABS
10.0000 mg | ORAL_TABLET | Freq: Every day | ORAL | Status: DC
  Administered 2020-05-08 – 2020-06-16 (×40): 10 mg via ORAL
  Filled 2020-05-08 (×41): qty 1

## 2020-05-08 MED ORDER — DEXAMETHASONE 2 MG PO TABS
2.0000 mg | ORAL_TABLET | Freq: Every day | ORAL | Status: AC
  Administered 2020-05-09 – 2020-05-12 (×4): 2 mg via ORAL
  Filled 2020-05-08 (×4): qty 1

## 2020-05-08 MED ORDER — MELATONIN 3 MG PO TABS: 3.0000 mg | ORAL_TABLET | Freq: Every day | ORAL | Status: DC

## 2020-05-08 NOTE — Progress Notes (Addendum)
PROGRESS NOTE   Subjective/Complaints: C/o cramping in left leg overnight, this morning. Struggled to sleep because of it. Bumped head on head board when being transferred up in bed.   ROS: Limited due to cognitive/behavioral     Objective:   No results found. No results for input(s): WBC, HGB, HCT, PLT in the last 72 hours. No results for input(s): NA, K, CL, CO2, GLUCOSE, BUN, CREATININE, CALCIUM in the last 72 hours.  Intake/Output Summary (Last 24 hours) at 05/08/2020 1426 Last data filed at 05/08/2020 1338 Gross per 24 hour  Intake 540 ml  Output --  Net 540 ml        Physical Exam: Vital Signs Blood pressure (!) 158/88, pulse 78, temperature 99.1 F (37.3 C), resp. rate 18, height 6' (1.829 m), weight 133.5 kg, SpO2 95 %. Constitutional: No distress . Vital signs reviewed. HEENT: EOMI, oral membranes moist Neck: supple Cardiovascular: RRR without murmur. No JVD    Respiratory/Chest: CTA Bilaterally without wheezes or rales. Normal effort    GI/Abdomen: BS +, non-tender, non-distended Ext: no clubbing, cyanosis, or edema Psych: pleasant and cooperative Skin: posterior aspect of incision with sl opening. Some drainage on pillow Neurologic: Cranial nerves II through XII intact, motor strength is 5/5 in Left and 0/5 right deltoid, tricep, grip,trace-1/5 bicep. tr/5 bilateral hip flexor, knee extensors, ankle dorsiflexor and plantar flexor. No changes today Sensory exam normal sensation to light touch and proprioception in bilateral upper and lower extremities. Musculoskeletal:limited ROM with knee flexion and hip flexion. No joint swelling Increased hip ext tone still present   Assessment/Plan: 1. Functional deficits which require 3+ hours per day of interdisciplinary therapy in a comprehensive inpatient rehab setting.  Physiatrist is providing close team supervision and 24 hour management of active medical  problems listed below.  Physiatrist and rehab team continue to assess barriers to discharge/monitor patient progress toward functional and medical goals  Care Tool:  Bathing    Body parts bathed by patient: Left arm,Chest,Abdomen,Face   Body parts bathed by helper: Right arm,Front perineal area,Buttocks,Right upper leg,Left upper leg,Right lower leg,Left lower leg     Bathing assist Assist Level: 2 Helpers     Upper Body Dressing/Undressing Upper body dressing   What is the patient wearing?: Pull over shirt    Upper body assist Assist Level: Total Assistance - Patient < 25%    Lower Body Dressing/Undressing Lower body dressing      What is the patient wearing?: Pants     Lower body assist Assist for lower body dressing: 2 Helpers     Toileting Toileting    Toileting assist Assist for toileting: 2 Helpers     Transfers Chair/bed transfer  Transfers assist  Chair/bed transfer activity did not occur: Safety/medical concerns        Locomotion Ambulation   Ambulation assist   Ambulation activity did not occur: Safety/medical concerns          Walk 10 feet activity   Assist  Walk 10 feet activity did not occur: Safety/medical concerns        Walk 50 feet activity   Assist Walk 50 feet with 2 turns activity did not  occur: Safety/medical concerns         Walk 150 feet activity   Assist Walk 150 feet activity did not occur: Safety/medical concerns         Walk 10 feet on uneven surface  activity   Assist Walk 10 feet on uneven surfaces activity did not occur: Safety/medical concerns         Wheelchair     Assist Will patient use wheelchair at discharge?: Yes (TBD but likely)             Wheelchair 50 feet with 2 turns activity    Assist    Wheelchair 50 feet with 2 turns activity did not occur: Safety/medical concerns       Wheelchair 150 feet activity     Assist  Wheelchair 150 feet activity did not  occur: Safety/medical concerns       Blood pressure (!) 158/88, pulse 78, temperature 99.1 F (37.3 C), resp. rate 18, height 6' (1.829 m), weight 133.5 kg, SpO2 95 %.    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 Decadron taper -Patient may  Shower with cap -ELOS/Goals: 3-4 wks minA goals  -Continue CIR therapies including PT, OT, and SLP   - decadron taper to off 2. Antithrombotics: -Left posterior tibial vein DVT:  Vascular ultrasound positive for age indeterminate left posterior tibial vein.   -discussed with Dr. Zada Finders yesterday who ok'd anticoagulation   -tolerating xarelto thus far. -antiplatelet therapy: N/A 3. Post-operative pain:tylenol prn  -add baclofen 10mg  qhs for cramps and to assist with sleep 4. Mood:Provide emotional support -antipsychotic agents: N/A 5. Neuropsych: This patientiscapable of making decisions on hisown behalf. 6. Skin/Wound Care:seems better   -double sheet bed   -turn in bed 7. Fluids/Electrolytes/Nutrition:eating well     8. Hypertension. Lasix 20 mg daily   -Restarted home Flomax HS which will also help with BP   -no norvasc d/t swelling   -consider b-blocker if elevation persistent 9. Hyperlipidemia. Lipitor 10. BPH: increased Flomax to 0.8mg  HS 11. Urinary frequency: foley out. Continent/incontinent  -UA negative.  timed voiding q1H while awake with urinal.     -Flomax to 0.8mg  given continued symptoms and elevated BP 12. Leukocytosis. Likely d/t steroids. Afebrile. No s/s of infection   -continue to follow   -tapering decadron to off  LOS: 6 days A FACE TO FACE EVALUATION WAS PERFORMED  [name] 05/08/2020, 2:26 PM

## 2020-05-08 NOTE — Progress Notes (Signed)
Occupational Therapy Session Note  Patient Details  Name: Anthony Downs MRN: [id number] Date of Birth: July 24, 1950  Today's Date: 05/08/2020 OT Individual Time: 1015-1100 OT Individual Time Calculation (min): 45 min    Short Term Goals: Week 1:  OT Short Term Goal 1 (Week 1): Pt will roll with MAX A of 1 caregiver to decrease BOC for toileitng OT Short Term Goal 2 (Week 1): Pt will tolerate laying sidelying on R 1x per day to assist with tone management OT Short Term Goal 3 (Week 1): Pt will recall hemi dressing techniques with MOD VC OT Short Term Goal 4 (Week 1): Pt will sit EOB 10 min with mod A in prep for ADL  Skilled Therapeutic Interventions/Progress Updates:    1:1. Pt received in bed agreeable to OT with no pain reported. Pt agreeable to bathing at bed level with HOH A to wash LUE with RUE with some tone noted in R arm. Pt rolls with +2 A to wash buttocks/back/advance pants past hips as well as pull shirt down back/place maximove. In prep for rolling OT provided MAX A to bring B knees to chest to break up extensor tone as well as provide rhythmic rotation to hips in prep for rolling. Maxi transfer to TIS with +2 to manage hips back into chair. Pt completes oral care with A for toothpaste application. Pt set up with LLE up on chair as no leg rest available. Seat belt applied. Wife present throughout and wil supervise while in chair. Call light in reach  Therapy Documentation Precautions:  Precautions Precautions: Fall,Other (comment) Precaution Comments: Strong BLE extensor tone, hx of R hand tremor Restrictions Weight Bearing Restrictions: No General:   Vital Signs: Therapy Vitals Temp: 99.1 F (37.3 C) Pulse Rate: 78 Resp: 18 BP: (!) 158/88 Patient Position (if appropriate): Lying Oxygen Therapy SpO2: 95 % O2 Device: Room Air Pain: Pain Assessment Pain Score: Asleep ADL: ADL Grooming: Moderate assistance (per pt report) Where Assessed-Grooming: Bed  level Upper Body Bathing: Moderate assistance Where Assessed-Upper Body Bathing: Bed level Lower Body Bathing: Dependent (+2 rolling) Where Assessed-Lower Body Bathing: Bed level Upper Body Dressing: Dependent (+2 for rolling to pull down back) Where Assessed-Upper Body Dressing: Bed level Lower Body Dressing: Dependent Where Assessed-Lower Body Dressing: Bed level Toileting: Unable to assess Vision   Perception    Praxis   Exercises:   Other Treatments:     Therapy/Group: Individual Therapy  [name] 05/08/2020, 6:52 AM

## 2020-05-08 NOTE — Progress Notes (Signed)
Physical Therapy Session Note  Patient Details  Name: Anthony Downs MRN: [id number] Date of Birth: 1950-10-24  Today's Date: 05/08/2020 PT Individual Time: 1300-1425 PT Individual Time Calculation (min): 85 min  Short Term Goals: Week 1:  PT Short Term Goal 1 (Week 1): Pt will perform R/L rolling in bed with +2 mod assist using bed features PT Short Term Goal 2 (Week 1): Pt will tolerate sitting EOB for at least 15 minutes during therapy PT Short Term Goal 3 (Week 1): Pt will maintain dynamic sitting balance EOM with max assist for at least 3 minutes PT Short Term Goal 4 (Week 1): Pt will perform supine<>sitting with +2 max assist  Skilled Therapeutic Interventions/Progress Updates:     Patient in TIS w/c with his wife in the room upon PT arrival. Patient alert and agreeable to PT session, did have bouts of drowsiness and nodding off throughout session due to increased fatigue. Patient and his wife report that he did not sleep well due to LE "spasms" throughout the night, report that they discussed this with MD this morning. Patient denied pain during session.  Therapeutic Activity: Bed Mobility: Patient performed Rolling R/L with mod-max A to the L and total A to the R to remove lift sling. Provided verbal cues for turning head in the direction he was rolling and reaching with his L hand to the bed rails with, able to roll enough to reach R bed rail without assist today. Transfers: Performed dependent transfers using the maxi move TIS w/c<>Tilt table and TIS w/c>bed. Patient tolerated transfers well.   Tilt Table: Performed for increased vertical tolerance, lower extremity weight bearing for tone management, and joint approximation. Remained at each progression 2-3 min. 0 deg poor reading due to wrong size cuff (asymptomatic) 20 deg with correct size cuff BP 145/84, HR 79 (asymptomatic) 40 deg BP 136/74, HR 88 (asymptomatic) 60 deg BP 124/69, HR 102 (asymptomatic) 60 deg x6 min  121/62, HR 110 (asymptomatic) Performed B upper extremity ROM, AROM on L, PROM on R, reaching on L with shoulders coming off the table for trunk control/core strengthening, multiple attempts to stimulate quad activation, loosened bottom strap to allow small knee flexion and provided multi-modal cues and passive assist into extension without muscle activation at this time.   Adjusted and donned B tension adjustable night splints, as tone was broken up by weight bearing. Educated patient and his wife on purpose and benefits of wearing these splints for reduced extensor tone and prevention of ankle contractures. Educated on donning technique, instructed to have staff perform at this time due to increased effort for donning. Educated on poor positioning, indicating the patient should have them removed, and skin checks to prevent skin breakdown. Instructed patient to progress wearing as tolerate, starting at at least 1 hour per day and progressing to over night. Patient and his wife receptive to all education, patient in agreement with wearing schedule.   Patient in bed with his wife at bedside at end of session with breaks locked, bed alarm set, and all needs within reach.    Therapy Documentation Precautions:  Precautions Precautions: Fall,Other (comment) Precaution Comments: Strong BLE extensor tone, hx of R hand tremor Restrictions Weight Bearing Restrictions: No   Therapy/Group: Individual Therapy  Samson Ralph L Alyn Riedinger PT, DPT  05/08/2020, 4:35 PM

## 2020-05-08 NOTE — Progress Notes (Signed)
Speech Language Pathology Daily Session Note  Patient Details  Name: Anthony Downs MRN: [id number] Date of Birth: 1951/01/01  Today's Date: 05/08/2020 SLP Individual Time: 0815-0900 SLP Individual Time Calculation (min): 45 min  Short Term Goals: Week 1: SLP Short Term Goal 1 (Week 1): Pt will recall novel/functional information with and without delay provided Min A verbal cues SLP Short Term Goal 2 (Week 1): Pt will complete mildly complex problem solving and executive function tasks provided min A verbal cues SLP Short Term Goal 3 (Week 1): Pt will participate in verbal fluency tasks (divergent naming, etc) with min A verbal cues SLP Short Term Goal 4 (Week 1): Pt will understand, recall and utilize strategies to reduce frustration and improve communication at simple conversation level provided min A verbal cues  Skilled Therapeutic Interventions: Skilled treatment session focused on cognitive-linguistic goals. Upon arrival, patient appeared mildly frustrated and reported it was due to previous muscle spasms in his legs and lack of sleep. Patient was premedicated and educated on talking to the physician about concerns during rounding. Patient had also not received his breakfast yet. SLP provided set-up assist and patient was able to feed himself with overall Mod I without any signs of swallowing deficits. Recommend patient only need set-up assist at this time. Patient with decreased word-finding and mild dysfluencies throughout session, suspect due to fatigue and frustration.  Patient left upright in bed with alarm on and all needs within reach. Continue with current plan of care.      Pain Pain Assessment Pain Scale: 0-10 Pain Score: 0-No pain  Therapy/Group: Individual Therapy  Daleena Rotter 05/08/2020, 12:24 PM

## 2020-05-08 NOTE — Progress Notes (Signed)
Occupational Therapy Session Note  Patient Details  Name: Anthony Downs MRN: [id number] Date of Birth: Jul 16, 1950  Today's Date: 05/08/2020 OT Individual Time: 1530-1600 OT Individual Time Calculation (min): 30 min    Short Term Goals: Week 1:  OT Short Term Goal 1 (Week 1): Pt will roll with MAX A of 1 caregiver to decrease BOC for toileitng OT Short Term Goal 2 (Week 1): Pt will tolerate laying sidelying on R 1x per day to assist with tone management OT Short Term Goal 3 (Week 1): Pt will recall hemi dressing techniques with MOD VC OT Short Term Goal 4 (Week 1): Pt will sit EOB 10 min with mod A in prep for ADL  Skilled Therapeutic Interventions/Progress Updates:    1:1. Pt received in bed agreeable to OT. Pt with no pain and of options provided requested to get up to EOB. OT provides total A to manage BLE ot chest, +2 pulls pad underneath patient and pt sup>sit EOB with total A. Pt able to A to LLE pushing up into bed. Pt sits EOB ~15 min initially with MAX A +2 fading to MOD A of 1 and eventally intermittent bouts of supervision with VC for head control. Pt leans laterally onto L elbow and pushing up to midline with L hand 3x with mod A. Exited session with pt seated in bed, exit alarm on and call light in reach   Therapy Documentation Precautions:  Precautions Precautions: Fall,Other (comment) Precaution Comments: Strong BLE extensor tone, hx of R hand tremor Restrictions Weight Bearing Restrictions: No General:   Vital Signs: Therapy Vitals Temp: 97.8 F (36.6 C) Temp Source: Oral Pulse Rate: 86 Resp: 18 BP: (!) 166/81 Patient Position (if appropriate): Lying Oxygen Therapy SpO2: 97 % O2 Device: Room Air Pain:   ADL: ADL Grooming: Moderate assistance (per pt report) Where Assessed-Grooming: Bed level Upper Body Bathing: Moderate assistance Where Assessed-Upper Body Bathing: Bed level Lower Body Bathing: Dependent (+2 rolling) Where Assessed-Lower Body  Bathing: Bed level Upper Body Dressing: Dependent (+2 for rolling to pull down back) Where Assessed-Upper Body Dressing: Bed level Lower Body Dressing: Dependent Where Assessed-Lower Body Dressing: Bed level Toileting: Unable to assess Vision   Perception    Praxis   Exercises:   Other Treatments:     Therapy/Group: Individual Therapy  [name] 05/08/2020, 4:12 PM

## 2020-05-09 MED ORDER — SENNOSIDES-DOCUSATE SODIUM 8.6-50 MG PO TABS
2.0000 | ORAL_TABLET | Freq: Two times a day (BID) | ORAL | Status: DC
  Administered 2020-05-09 – 2020-06-17 (×72): 2 via ORAL
  Filled 2020-05-09 (×74): qty 2

## 2020-05-09 NOTE — Progress Notes (Signed)
Occupational Therapy Session Note  Patient Details  Name: Anthony Downs MRN: [id number] Date of Birth: 04-06-1950  Today's Date: 05/09/2020 OT Individual Time: 1400-1430 OT Individual Time Calculation (min): 30 min    Short Term Goals: Week 1:  OT Short Term Goal 1 (Week 1): Pt will roll with MAX A of 1 caregiver to decrease BOC for toileitng OT Short Term Goal 2 (Week 1): Pt will tolerate laying sidelying on R 1x per day to assist with tone management OT Short Term Goal 3 (Week 1): Pt will recall hemi dressing techniques with MOD VC OT Short Term Goal 4 (Week 1): Pt will sit EOB 10 min with mod A in prep for ADL  Skilled Therapeutic Interventions/Progress Updates:    1:1. Pt received in bed agreeable to OT. RN in room entire session and used as +2. Pt agreeable to sitting EOB and does not rate pain. Pt given total A PROM knees to chestand RN uses chuck pad to pull pt laterally in bed. MAX A +2 with pt pushing through L elbow on bed to assume seated EOB. Notable improvement of sitting balance this date with pt needing MIN A initially at EOB and OT able ot give multiple perturbations to trunk with min force and pt able to recover with LUE holding onto LLE. Without grasp pt requires MOD A to recover. Pt able to sit on EOB ~10-12 min overall. Pt positoned in sidelying on R with pillows and OT exits with RN still in room. Exit alarm on and call light in reach  Therapy Documentation Precautions:  Precautions Precautions: Fall,Other (comment) Precaution Comments: Strong BLE extensor tone, hx of R hand tremor Restrictions Weight Bearing Restrictions: No General:   Vital Signs:   Pain:   ADL: ADL Grooming: Moderate assistance (per pt report) Where Assessed-Grooming: Bed level Upper Body Bathing: Moderate assistance Where Assessed-Upper Body Bathing: Bed level Lower Body Bathing: Dependent (+2 rolling) Where Assessed-Lower Body Bathing: Bed level Upper Body Dressing: Dependent (+2  for rolling to pull down back) Where Assessed-Upper Body Dressing: Bed level Lower Body Dressing: Dependent Where Assessed-Lower Body Dressing: Bed level Toileting: Unable to assess Vision   Perception    Praxis   Exercises:   Other Treatments:     Therapy/Group: Individual Therapy  [name] 05/09/2020, 6:51 AM

## 2020-05-09 NOTE — Progress Notes (Signed)
Speech Language Pathology Weekly Progress and Session Note  Patient Details  Name: Anthony Downs MRN: [id number] Date of Birth: September 08, 1950  Beginning of progress report period: May 02, 2020 End of progress report period: May 09, 2020  Today's Date: 05/09/2020 SLP Individual Time: 1100-1125 SLP Individual Time Calculation (min): 25 min  Short Term Goals: Week 1: SLP Short Term Goal 1 (Week 1): Pt will recall novel/functional information with and without delay provided Min A verbal cues SLP Short Term Goal 1 - Progress (Week 1): Met SLP Short Term Goal 2 (Week 1): Pt will complete mildly complex problem solving and executive function tasks provided min A verbal cues SLP Short Term Goal 2 - Progress (Week 1): Met SLP Short Term Goal 3 (Week 1): Pt will participate in verbal fluency tasks (divergent naming, etc) with min A verbal cues SLP Short Term Goal 3 - Progress (Week 1): Met SLP Short Term Goal 4 (Week 1): Pt will understand, recall and utilize strategies to reduce frustration and improve communication at simple conversation level provided min A verbal cues SLP Short Term Goal 4 - Progress (Week 1): Met    New Short Term Goals: Week 2: SLP Short Term Goal 1 (Week 2): Pt will recall novel/functional information with and without delay provided with supervision verbal cues SLP Short Term Goal 2 (Week 2): Pt will complete mildly complex problem solving and executive function tasks provided supervision verbal cues SLP Short Term Goal 3 (Week 2): Pt will participate in verbal fluency tasks (divergent naming, etc) with supervision verbal cues SLP Short Term Goal 4 (Week 2): Pt will understand, recall and utilize strategies to reduce frustration and improve communication at simple conversation level provided supervision verbal cues  Weekly Progress Updates: Patient has made functional gains and has met 4 of 4 STGs this reporting period. Currently, patient requires overall extra time  and supervision-min A verbal cues for word-finding, verbal fluency and awareness of errors at the conversation level. Patient also requires Min A verbal cues for complex problem solving and recall of functional information. Patient's overall function can be impacted at times due to pain and fatigue. Patient and family education ongoing. Patient would benefit from continued skilled SLP intervention to maximize his cognitive-linguistic functioning prior to discharge.      Intensity: Minumum of 1-2 x/day, 30 to 90 minutes Frequency: 3 to 5 out of 7 days Duration/Length of Stay: 5 weeks Treatment/Interventions: Cognitive remediation/compensation;Therapeutic Activities;Therapeutic Exercise;Internal/external aids;Medication managment;Patient/family education;Cueing hierarchy;Functional tasks   Daily Session  Skilled Therapeutic Interventions: Skilled treatment session focused on cognitive goals. Upon arrival, patient was lethargic and reported it was due to lack of sleep, physician aware. SLP facilitated session by providing total A for recall of his current medications and their functions. Due to fatigue, patient required Max verbal cues for arousal, patient unable to participate in pill organization task, will re-attempt at next session. Patient left upright in bed with alarm on and all needs within reach. Continue with current plan of care.       Pain No/Denis Pain   Therapy/Group: Individual Therapy  Oral Hallgren 05/09/2020, 6:40 AM

## 2020-05-09 NOTE — Progress Notes (Signed)
Physical Therapy Session Note  Patient Details  Name: Anthony Downs MRN: [id number] Date of Birth: 1950-09-14  Today's Date: 05/09/2020 PT Individual Time: 1000-1100 PT Individual Time Calculation (min): 60 min   Short Term Goals: Week 1:  PT Short Term Goal 1 (Week 1): Pt will perform R/L rolling in bed with +2 mod assist using bed features PT Short Term Goal 2 (Week 1): Pt will tolerate sitting EOB for at least 15 minutes during therapy PT Short Term Goal 3 (Week 1): Pt will maintain dynamic sitting balance EOM with max assist for at least 3 minutes PT Short Term Goal 4 (Week 1): Pt will perform supine<>sitting with +2 max assist  Skilled Therapeutic Interventions/Progress Updates:     Patient in TIS w/c upon PT arrival. Patient alert and agreeable to PT session. Patient reported significant R hip discomfort due to extensor tone activating with his leg placed in full knee extension. PT provided repositioning, rest breaks, and distraction as pain interventions throughout session. Patient continues to report poor sleep quality last night, does report that he received Baclofen prior to bed time.   Focused session on modifying w/c for improved sitting tolerance. Obtained L leg rest, added and adjusted calf pad, and extended leg rest to fit patient for L lower extremity support, previously had L leg resting on a chair due to missing leg rest. Educated patient and his wife about placing leg rests so that his knees are bent to reduce activating extensor tone and on pressure relief in TIS to reduce skin breakdown and increased tolerance in sitting. Also, educated on benefits of sitting OOB for postural muscles, respiratory and cardiovascular benefits, and increased day time arousal. Patient reported significant improvement in discomfort in sitting following chair modifications, however, reported increased fatigue in sitting and requested to return to bed.   Performed dependent transfer TIS  w/c>bed using Maxi move due to tetraplegia and body habitus. Patient performed rolling R with mod A, independently placing R hand on rail to pull himself over for lift sling removal.  Patient in bed with his wife in the room handed off to [place], SLP at end of session..    Therapy Documentation Precautions:  Precautions Precautions: Fall,Other (comment) Precaution Comments: Strong BLE extensor tone, hx of R hand tremor Restrictions Weight Bearing Restrictions: No   Therapy/Group: Individual Therapy  Elvy Mclarty L Nai Dasch PT, DPT  05/09/2020, 11:42 AM

## 2020-05-09 NOTE — Progress Notes (Signed)
Physical Therapy Session Note  Patient Details  Name: Anthony Downs MRN: [id number] Date of Birth: 10/19/1950  Today's Date: 05/09/2020 PT Individual Time: 0800-0900 PT Individual Time Calculation (min): 60 min   Short Term Goals: Week 1:  PT Short Term Goal 1 (Week 1): Pt will perform R/L rolling in bed with +2 mod assist using bed features PT Short Term Goal 2 (Week 1): Pt will tolerate sitting EOB for at least 15 minutes during therapy PT Short Term Goal 3 (Week 1): Pt will maintain dynamic sitting balance EOM with max assist for at least 3 minutes PT Short Term Goal 4 (Week 1): Pt will perform supine<>sitting with +2 max assist  Skilled Therapeutic Interventions/Progress Updates: Pt presents supine in bed, and agreeable to therapy, spouse present.  Pt was soiled w/ urine and required max A for flexing knees and rolling side to side using side rail, l > right, initiating reaching for rail and turning.  Pt able to hold rail and assist maintaining side-lying.  New brief donned and pericare done at total A, including BM smear, but not on brief.  Pt performed multiple rolls to don shorts over hips and then Total A for TED hose donning.  Pt required A of 2 for L sidelying to sitting at EOB..  Pt sat EOB w/ min A and facilitation at elbow to maintain upright seating.  Max A to thread R arm into shirt and then pt puts L arm through and then overhead w/ Min A and then assist to position shirt.  Pt continued to sit EOB w/ min A, BP at 139/73, HR 90 while talking w/ MD, approximation of RUE.  Pt the transferred w/ A mod 2 to supine, rolling for Maximove sling placement.  Pt transferred to [name] w/ Maximove and positioned in seat.  Passive stretches to B ankles for improved DF.  Spouse present for continued education for BLE splints w/ heels positioned well.  Pt still somewhat groggy from meds from last night.  Pt tilted back in TIS and head positioned.  Pt left in w/c w/ spouse present and all needs in  reach.     Therapy Documentation Precautions:  Precautions Precautions: Fall,Other (comment) Precaution Comments: Strong BLE extensor tone, hx of R hand tremor Restrictions Weight Bearing Restrictions: No General:   Vital Signs:  Pain: pt states "no pain"      Therapy/Group: Individual Therapy  Ladoris Gene 05/09/2020, 9:12 AM

## 2020-05-09 NOTE — Progress Notes (Signed)
PROGRESS NOTE   Subjective/Complaints: Had a better night. Fell to sleep for a few hours after baclofen. Didn't have any cramping. Up with PT at bedside  ROS: Limited due to cognitive/behavioral   Objective:   No results found. No results for input(s): WBC, HGB, HCT, PLT in the last 72 hours. No results for input(s): NA, K, CL, CO2, GLUCOSE, BUN, CREATININE, CALCIUM in the last 72 hours.  Intake/Output Summary (Last 24 hours) at 05/09/2020 1040 Last data filed at 05/09/2020 0842 Gross per 24 hour  Intake 600 ml  Output 400 ml  Net 200 ml        Physical Exam: Vital Signs Blood pressure (!) 144/70, pulse 81, temperature 98.5 F (36.9 C), resp. rate 17, height 6' (1.829 m), weight 133.5 kg, SpO2 97 %. Constitutional: No distress . Vital signs reviewed. HEENT: EOMI, oral membranes moist Neck: supple Cardiovascular: RRR without murmur. No JVD    Respiratory/Chest: CTA Bilaterally without wheezes or rales. Normal effort    GI/Abdomen: BS +, non-tender, non-distended Ext: no clubbing, cyanosis, or edema Psych: pleasant and cooperative Skin: posterior aspect of incision now dry.  Neurologic: Cranial nerves II through XII intact, motor strength is 5/5 in Left and 0/5 right deltoid, tricep, grip,trace-1/5 bicep. tr/5 bilateral hip flexor, knee extensors, ankle dorsiflexor and plantar flexor. Fair sitting balance with cueing.  Musculoskeletal:limited ROM with knee flexion and hip flexion. No joint swelling Increased hip ext tone still present   Assessment/Plan: 1. Functional deficits which require 3+ hours per day of interdisciplinary therapy in a comprehensive inpatient rehab setting.  Physiatrist is providing close team supervision and 24 hour management of active medical problems listed below.  Physiatrist and rehab team continue to assess barriers to discharge/monitor patient progress toward functional and medical  goals  Care Tool:  Bathing    Body parts bathed by patient: Left arm,Chest,Abdomen,Face   Body parts bathed by helper: Right arm,Front perineal area,Buttocks,Right upper leg,Left upper leg,Right lower leg,Left lower leg     Bathing assist Assist Level: 2 Helpers     Upper Body Dressing/Undressing Upper body dressing   What is the patient wearing?: Pull over shirt    Upper body assist Assist Level: Total Assistance - Patient < 25%    Lower Body Dressing/Undressing Lower body dressing      What is the patient wearing?: Pants     Lower body assist Assist for lower body dressing: 2 Helpers     Toileting Toileting    Toileting assist Assist for toileting: 2 Helpers     Transfers Chair/bed transfer  Transfers assist  Chair/bed transfer activity did not occur: Safety/medical concerns  Chair/bed transfer assist level: Dependent - mechanical lift     Locomotion Ambulation   Ambulation assist   Ambulation activity did not occur: Safety/medical concerns          Walk 10 feet activity   Assist  Walk 10 feet activity did not occur: Safety/medical concerns        Walk 50 feet activity   Assist Walk 50 feet with 2 turns activity did not occur: Safety/medical concerns         Walk 150 feet activity  Assist Walk 150 feet activity did not occur: Safety/medical concerns         Walk 10 feet on uneven surface  activity   Assist Walk 10 feet on uneven surfaces activity did not occur: Safety/medical concerns         Wheelchair     Assist Will patient use wheelchair at discharge?: Yes (TBD but likely)             Wheelchair 50 feet with 2 turns activity    Assist    Wheelchair 50 feet with 2 turns activity did not occur: Safety/medical concerns       Wheelchair 150 feet activity     Assist  Wheelchair 150 feet activity did not occur: Safety/medical concerns       Blood pressure (!) 144/70, pulse 81, temperature  98.5 F (36.9 C), resp. rate 17, height 6' (1.829 m), weight 133.5 kg, SpO2 97 %.    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 Decadron taper -Patient may  Shower with cap -ELOS/Goals: 3-4 wks minA goals  -Continue CIR therapies including PT, OT, and SLP    -tension PRAFO's for night use  - decadron taper to off 2. Antithrombotics: -Left posterior tibial vein DVT:  Vascular ultrasound positive for age indeterminate left posterior tibial vein.   -discussed with Dr. Zada Finders who ok'd anticoagulation   -tolerating xarelto thus far. -antiplatelet therapy: N/A 3. Post-operative pain:tylenol prn  -add baclofen 10mg  qhs for cramps and to assist with sleep 4. Mood:Provide emotional support -antipsychotic agents: N/A 5. Neuropsych: This patientiscapable of making decisions on hisown behalf. 6. Skin/Wound Care:seems better   -double sheet bed   -turn in bed 7. Fluids/Electrolytes/Nutrition:eating well     8. Hypertension. Lasix 20 mg daily   -Restarted home Flomax HS which will also help with BP   -no norvasc d/t swelling   -consider b-blocker if needed 9. Hyperlipidemia. Lipitor 10. BPH: increased Flomax to 0.8mg  HS 11. Urinary frequency: foley out. Continent/incontinent  -UA negative.  timed voiding q1H while awake with urinal.     -Flomax to 0.8mg  given continued symptoms and elevated BP 12. Leukocytosis. Likely d/t steroids. Afebrile. No s/s of infection   -continue to follow   -tapering decadron to off 13. Constipation:  -increase colace to senna-s bid  -had bm last night  LOS: 7 days A FACE TO FACE EVALUATION WAS PERFORMED  [name] 05/09/2020, 10:40 AM

## 2020-05-10 NOTE — Progress Notes (Signed)
PROGRESS NOTE   Subjective/Complaints: No complaints. Sleeping better. No cramps last night. Feels that he's getting stronger  ROS: Patient denies fever, rash, sore throat, blurred vision, nausea, vomiting, diarrhea, cough, shortness of breath or chest pain, joint or back pain, headache, or mood change.    Objective:   No results found. No results for input(s): WBC, HGB, HCT, PLT in the last 72 hours. No results for input(s): NA, K, CL, CO2, GLUCOSE, BUN, CREATININE, CALCIUM in the last 72 hours.  Intake/Output Summary (Last 24 hours) at 05/10/2020 1111 Last data filed at 05/10/2020 0726 Gross per 24 hour  Intake 840 ml  Output --  Net 840 ml        Physical Exam: Vital Signs Blood pressure (!) 156/83, pulse 90, temperature 98 F (36.7 C), resp. rate 19, height 6' (1.829 m), weight 133.5 kg, SpO2 94 %. Constitutional: No distress . Vital signs reviewed. HEENT: EOMI, oral membranes moist Neck: supple Cardiovascular: RRR without murmur. No JVD    Respiratory/Chest: CTA Bilaterally without wheezes or rales. Normal effort    GI/Abdomen: BS +, non-tender, non-distended Ext: no clubbing, cyanosis, or edema Psych: pleasant and cooperative Skin: posterior aspect of incision now dry.  Neurologic: Cranial nerves II through XII intact, motor strength is 5/5 in Left and 0/5 right deltoid, tricep, grip,trace-1/5 bicep. tr/5 bilateral hip flexor, knee extensors, ankle dorsiflexor and plantar flexor. Fair sitting balance with cueing.  Musculoskeletal:limited ROM with knee flexion and hip flexion. No joint swelling Ongoing increased extensor tone   Assessment/Plan: 1. Functional deficits which require 3+ hours per day of interdisciplinary therapy in a comprehensive inpatient rehab setting.  Physiatrist is providing close team supervision and 24 hour management of active medical problems listed below.  Physiatrist and rehab team  continue to assess barriers to discharge/monitor patient progress toward functional and medical goals  Care Tool:  Bathing    Body parts bathed by patient: Left arm,Chest,Abdomen,Face   Body parts bathed by helper: Right arm,Front perineal area,Buttocks,Right upper leg,Left upper leg,Right lower leg,Left lower leg     Bathing assist Assist Level: 2 Helpers     Upper Body Dressing/Undressing Upper body dressing   What is the patient wearing?: Pull over shirt    Upper body assist Assist Level: Total Assistance - Patient < 25%    Lower Body Dressing/Undressing Lower body dressing      What is the patient wearing?: Pants     Lower body assist Assist for lower body dressing: 2 Helpers     Toileting Toileting    Toileting assist Assist for toileting: 2 Helpers     Transfers Chair/bed transfer  Transfers assist  Chair/bed transfer activity did not occur: Safety/medical concerns  Chair/bed transfer assist level: Dependent - mechanical lift     Locomotion Ambulation   Ambulation assist   Ambulation activity did not occur: Safety/medical concerns          Walk 10 feet activity   Assist  Walk 10 feet activity did not occur: Safety/medical concerns        Walk 50 feet activity   Assist Walk 50 feet with 2 turns activity did not occur: Safety/medical concerns  Walk 150 feet activity   Assist Walk 150 feet activity did not occur: Safety/medical concerns         Walk 10 feet on uneven surface  activity   Assist Walk 10 feet on uneven surfaces activity did not occur: Safety/medical concerns         Wheelchair     Assist Will patient use wheelchair at discharge?: Yes (TBD but likely)             Wheelchair 50 feet with 2 turns activity    Assist    Wheelchair 50 feet with 2 turns activity did not occur: Safety/medical concerns       Wheelchair 150 feet activity     Assist  Wheelchair 150 feet activity  did not occur: Safety/medical concerns       Blood pressure (!) 156/83, pulse 90, temperature 98 F (36.7 C), resp. rate 19, height 6' (1.829 m), weight 133.5 kg, SpO2 94 %.    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 Decadron taper -Patient may  Shower with cap -ELOS/Goals: 3-4 wks minA goals  -Continue CIR therapies including PT, OT, and SLP    -night splints at HS  - decadron taper to off 2. Antithrombotics: -Left posterior tibial vein DVT:  Vascular ultrasound positive for age indeterminate left posterior tibial vein.   -discussed with Dr. Zada Finders who ok'd anticoagulation   -continue xarelto. -antiplatelet therapy: N/A 3. Post-operative pain:tylenol prn  -add baclofen 10mg  qhs for cramps and to assist with sleep 4. Mood:Provide emotional support -antipsychotic agents: N/A 5. Neuropsych: This patientiscapable of making decisions on hisown behalf. 6. Skin/Wound Care:seems improved   -double sheet bed   -turn in bed 7. Fluids/Electrolytes/Nutrition:eating well     8. Hypertension. Lasix 20 mg daily   -Restarted home Flomax HS which will also help with BP   -no norvasc d/t swelling   -consider b-blocker if needed\   -4/23 fair control 9. Hyperlipidemia. Lipitor 10. BPH: increased Flomax to 0.8mg  HS 11. Urinary frequency: foley out. Continent/incontinent  -UA negative.  timed voiding q1H while awake with urinal.     -Flomax to 0.8mg  given continued symptoms and elevated BP 12. Leukocytosis. Likely d/t steroids. Afebrile. No s/s of infection   -continue to follow   -tapering decadron to off 13. Constipation:  -increased colace to senna-s bid  -moving bowels regularly  LOS: 8 days A FACE TO FACE EVALUATION WAS  PERFORMED  [name] 05/10/2020, 11:11 AM

## 2020-05-10 NOTE — Progress Notes (Signed)
Occupational Therapy Weekly Progress Note  Patient Details  Name: Anthony Downs MRN: [id number] Date of Birth: 04/23/1950  Beginning of progress report period: May 03, 2020 End of progress report period: May 10, 2020  Today's Date: 05/10/2020 OT Individual Time: 1345-1500 OT Individual Time Calculation (min): 75 min    Patient has met 3 of 4 short term goals.  Pt is making slow but steady progress. Pt can roll with MAX A of 1 caregiver for LB dressing/toieting and is able to sit EOB ~10 min statically with min-mod A overall. Pt continues to require total A, however tone in RLE is decreased and pt has begun to show reflexive movements in RUE. Pt is very driven and participates well in tx.   Patient continues to demonstrate the following deficits: muscle weakness, decreased cardiorespiratoy endurance, impaired timing and sequencing, abnormal tone and unbalanced muscle activation, decreased initiation, decreased attention, decreased memory and delayed processing and decreased sitting balance, decreased standing balance, decreased postural control and decreased balance strategies and therefore will continue to benefit from skilled OT intervention to enhance overall performance with BADL and Reduce care partner burden.  Patient progressing toward long term goals..  Continue plan of care.  OT Short Term Goals Week 1:  OT Short Term Goal 1 (Week 1): Pt will roll with MAX A of 1 caregiver to decrease BOC for toileitng OT Short Term Goal 1 - Progress (Week 1): Met OT Short Term Goal 2 (Week 1): Pt will tolerate laying sidelying on R 1x per day to assist with tone management OT Short Term Goal 2 - Progress (Week 1): Met OT Short Term Goal 3 (Week 1): Pt will recall hemi dressing techniques with MOD VC OT Short Term Goal 3 - Progress (Week 1): Progressing toward goal OT Short Term Goal 4 (Week 1): Pt will sit EOB 10 min with mod A in prep for ADL OT Short Term Goal 4 - Progress (Week 1):  Met Week 2:  OT Short Term Goal 1 (Week 2): Pt will maintain dynamic sitting balnace in min ranges outside BOS with MIN A overall for 5 min OT Short Term Goal 2 (Week 2): Pt will recall hemi dressing techniques wiht MOD VC OT Short Term Goal 3 (Week 2): Pt will complete 2 steps of UB dressing OT Short Term Goal 4 (Week 2): Pt will groom with MIN A  Skilled Therapeutic Interventions/Progress Updates:    Pt received in bed with wife present. Pt agreeable to getting dressed and into TIS. Pt becomes tearful when OT suggests going outside stating, "I didn't know we could do that!" pt requires MAX A of 1 to roll in B direction with OT being able to manage BLE and rolling without needing to bring knees to chest. Pt requires +2 A to transfer in maximove to/from bed into TIS. OT demo to wife edema massage and PROM for RUE. ? Some tone in shoulder with flex/ext movements. Pt returns to room after enjoying sunshine and returns to bed to play a few rounds of headbands only missing 1 word out of 10 and able to describe 10 words to OT well for OT to guess word to work on expression and processing speed. Exited session with pt seated in bed, exit alarm on and call light in reach   Therapy Documentation Precautions:  Precautions Precautions: Fall,Other (comment) Precaution Comments: Strong BLE extensor tone, hx of R hand tremor Restrictions Weight Bearing Restrictions: No General:   Vital Signs: Therapy Vitals Temp:   98 F (36.7 C) Pulse Rate: 90 Resp: 19 BP: (!) 156/83 Patient Position (if appropriate): Lying Oxygen Therapy SpO2: 94 % O2 Device: Room Air Pain:   ADL: ADL Grooming: Moderate assistance (per pt report) Where Assessed-Grooming: Bed level Upper Body Bathing: Moderate assistance Where Assessed-Upper Body Bathing: Bed level Lower Body Bathing: Dependent (+2 rolling) Where Assessed-Lower Body Bathing: Bed level Upper Body Dressing: Dependent (+2 for rolling to pull down back) Where  Assessed-Upper Body Dressing: Bed level Lower Body Dressing: Dependent Where Assessed-Lower Body Dressing: Bed level Toileting: Unable to assess Vision   Perception    Praxis   Exercises:   Other Treatments:     Therapy/Group: Individual Therapy   M  05/10/2020, 6:59 AM  

## 2020-05-11 MED ORDER — TRAZODONE HCL 50 MG PO TABS: 50.0000 mg | ORAL_TABLET | Freq: Every evening | ORAL | Status: DC | PRN

## 2020-05-11 NOTE — Progress Notes (Signed)
Physical Therapy Session Note  Patient Details  Name: Anthony Downs MRN: [id number] Date of Birth: Feb 19, 1950  Today's Date: 05/11/2020 PT Individual Time: 2947-6546 PT Individual Time Calculation (min): 56 min   Short Term Goals: Week 1:  PT Short Term Goal 1 (Week 1): Pt will perform R/L rolling in bed with +2 mod assist using bed features PT Short Term Goal 2 (Week 1): Pt will tolerate sitting EOB for at least 15 minutes during therapy PT Short Term Goal 3 (Week 1): Pt will maintain dynamic sitting balance EOM with max assist for at least 3 minutes PT Short Term Goal 4 (Week 1): Pt will perform supine<>sitting with +2 max assist  Skilled Therapeutic Interventions/Progress Updates: Pt presents supine in bed w/ spouse present.  Pt agreeable to therapy.  Pt performed multiple rolling to change soiled brief.  Pt incontinent of urine and small smear of BM, NT notified and will chart.  Total A for pericare.   Pt required assist of 2 (spouse assisting) or rolling, although reached for railing and assists once knees flexed into hooklying.  Pt able to hold self in sidelying. Shorts threaded onto LEs and then rolled to pull up to hips.  Pt rolled to left for sidelying to sit w/ max A.  Once pt scooted to EOB and feet supported, balance improved.  R UE threaded through sleeve and then pt able to bring LUE into sleeve and pulls shirt over head, assist from PT and spouse for correct positioning.  Pt sat x 10' w/ facilitation at R elbow and hand for approximation.  Pt  Lists to left, retropulses but able to regain w/ min hands-on assist.  Pt then returned to supine for rolling to place lift sling.  NT to room for 2nd person for total A w/ maximove bed > TIS.  Pt positioned in w/c w/ B foot brace and padded leg rests for comfort.  Pt reclined in TIS and spouse remained in room.  Pt tends to fall asleep throughout session, but arouses quickly.     Therapy Documentation Precautions:   Precautions Precautions: Fall,Other (comment) Precaution Comments: Strong BLE extensor tone, hx of R hand tremor Restrictions Weight Bearing Restrictions: No General:   Vital Signs:  Pain:0/10 Pain Assessment Pain Scale: 0-10 Pain Score: 0-No pain Mobility:     Therapy/Group: Individual Therapy  Ladoris Gene 05/11/2020, 1:00 PM

## 2020-05-11 NOTE — Progress Notes (Signed)
Speech Language Pathology Daily Session Note  Patient Details  Name: Anthony Downs MRN: [id number] Date of Birth: March 26, 1950  Today's Date: 05/11/2020 SLP Individual Time: 3335-4562 SLP Individual Time Calculation (min): 40 min  Short Term Goals: Week 2: SLP Short Term Goal 1 (Week 2): Pt will recall novel/functional information with and without delay provided with supervision verbal cues SLP Short Term Goal 2 (Week 2): Pt will complete mildly complex problem solving and executive function tasks provided supervision verbal cues SLP Short Term Goal 3 (Week 2): Pt will participate in verbal fluency tasks (divergent naming, etc) with supervision verbal cues SLP Short Term Goal 4 (Week 2): Pt will understand, recall and utilize strategies to reduce frustration and improve communication at simple conversation level provided supervision verbal cues  Skilled Therapeutic Interventions:  Pt was seen for skilled ST targeting cognitive-linguistic goals.  Upon arrival, pt was awake in bed but very lethargic, stating that he hadn't slept more than 4 hours over the last 4 nights.  As a result, pt had increased response latency and increased difficulty with word finding in comparison to previous reports.  Despite fatigue, pt was willing to participate in structured treatment tasks and task difficulty was adjusted to accommodate.  SLP facilitated the session with a divergent naming task to address word finding goals.  Pt was able to name 5 specific category members for 3 different categories with mod I.  He then remembered all but one named item independently.  Pt needed mod assist verbal cues to recall missed item.  Pt was left in bed with bed alarm set and call bell within reach.  Wife was at bedside.  Continue per current plan of care.     Pain Pain Assessment Pain Scale: 0-10 Pain Score: 0-No pain  Therapy/Group: Individual Therapy  Sundee Garland, Selinda Orion 05/11/2020, 10:42 AM

## 2020-05-12 MED ORDER — DICLOFENAC SODIUM 1 % EX GEL
2.0000 g | Freq: Four times a day (QID) | CUTANEOUS | Status: DC
  Administered 2020-05-12 – 2020-06-17 (×133): 2 g via TOPICAL
  Filled 2020-05-12 (×4): qty 100

## 2020-05-12 NOTE — Progress Notes (Signed)
Occupational Therapy Session Note  Patient Details  Name: Anthony Downs MRN: [id number] Date of Birth: July 12, 1950  Today's Date: 05/12/2020 OT Individual Time: 1300-1340 OT Individual Time Calculation (min): 40 min  and Today's Date: 05/12/2020 OT Missed Time: 20 Minutes Missed Time Reason: Patient fatigue   Short Term Goals: Week 2:  OT Short Term Goal 1 (Week 2): Pt will maintain dynamic sitting balnace in min ranges outside BOS with MIN A overall for 5 min OT Short Term Goal 2 (Week 2): Pt will recall hemi dressing techniques wiht MOD VC OT Short Term Goal 3 (Week 2): Pt will complete 2 steps of UB dressing OT Short Term Goal 4 (Week 2): Pt will groom with MIN A  Skilled Therapeutic Interventions/Progress Updates:    Pt received in TIS w/c reporting incontinent BM. With assist of NT maximove hoyer was used to return pt to bed for peri care and brief change. Increased time required to allow pt to initiate bed mobility, pulling with his LUE to roll R. Heavy facilitation required at the BLE (R>L) to break up extensor tone to bend knees for increased ease of rolling. Total A +2 for hygiene and to don new brief. Barrier cream applied along distal gluteal cleft and under scrotum d/t skin redness.  1:1 NMES applied to R bicep to increase muscle activation and promote NMR. Pt fell asleep during e-stim and was unable to maintain alertness for remainder of session. Finished out 15 min of attended e-stim and then missed final 20 min of session d/t fatigue.  Ratio 1:3 Rate 35 pps Waveform- Asymmetric Ramp 1.0 Pulse 300 Intensity- 12 Duration - 15 min  Report of pain at the beginning of session 0/10 Report of pain at the end of session 0/10  No adverse reactions after treatment and is skin intact. Pt was left supine with all needs met. Bed alarm set. Sign posted above bed for carryover across all staff for R shoulder management and joint protection.   Therapy Documentation Precautions:   Precautions Precautions: Fall,Other (comment) Precaution Comments: Strong BLE extensor tone, hx of R hand tremor Restrictions Weight Bearing Restrictions: No Therapy/Group: Individual Therapy  Curtis Sites 05/12/2020, 1:32 PM

## 2020-05-12 NOTE — Progress Notes (Signed)
Incontinent of large BM, HS bath provided with skin care back rub Feet elevated on pillows, repositioned and turned Q 1-2  Hrs, made as comfortable as possible

## 2020-05-12 NOTE — Progress Notes (Signed)
Physical Therapy Weekly Progress Note  Patient Details  Name: Anthony Downs MRN: [id number] Date of Birth: 12/04/1950  Beginning of progress report period: May 03, 2020 End of progress report period: May 12, 2020  Today's Date: 05/12/2020 PT Individual Time: 1017-1110 PT Individual Time Calculation (min): 53 min   Patient has met 1 of 4 short term goals.  Patient with slow, but steady progress this week. Currently performing bed mobility with max A of 1-2, sitting balance with min A-supervision up to 10 min, transfers are dependent with maxi move due to no volitional return in B lower extremities or R upper extremity at this time and patient's body habitus. He has tolerated the Tilt table at 60 degrees >5 min and sitting OOB in TIS w/c >2 hours.   Patient continues to demonstrate the following deficits muscle weakness, decreased cardiorespiratoy endurance, abnormal tone, decreased midline orientation and decreased attention to right, decreased problem solving and delayed processing and decreased sitting balance, decreased standing balance, decreased postural control, hemiplegia and decreased balance strategies and therefore will continue to benefit from skilled PT intervention to increase functional independence with mobility.  Patient progressing toward long term goals..  Continue plan of care. Will reassess LTGs next week base on patient's progress.  PT Short Term Goals Week 1:  PT Short Term Goal 1 (Week 1): Pt will perform R/L rolling in bed with +2 mod assist using bed features PT Short Term Goal 1 - Progress (Week 1): Partly met (roll R with mod A +1, roll L with max A +2) PT Short Term Goal 2 (Week 1): Pt will tolerate sitting EOB for at least 15 minutes during therapy PT Short Term Goal 2 - Progress (Week 1): Progressing toward goal PT Short Term Goal 3 (Week 1): Pt will maintain dynamic sitting balance EOM with max assist for at least 3 minutes PT Short Term Goal 3 -  Progress (Week 1): Progressing toward goal PT Short Term Goal 4 (Week 1): Pt will perform supine<>sitting with +2 max assist PT Short Term Goal 4 - Progress (Week 1): Met PT Short Term Goal 5 (Week 1): Pt will tolerate sitting EOB for at least 15 minutes during therapy Week 2:  PT Short Term Goal 1 (Week 2): Pt will maintain dynamic sitting balance EOM with max assist for at least 3 minutes PT Short Term Goal 2 (Week 2): Pt will perform bed mobility with max A +1 consistently. PT Short Term Goal 3 (Week 2): Patient will tolerate dependent lower extremity weight bearing >5 min using LRAD. PT Short Term Goal 4 (Week 2): Patient will initiate power mobility.  Skilled Therapeutic Interventions/Progress Updates:     Patient in bed with his wife in the room upon PT arrival. Patient alert and agreeable to PT session. Patient reported 6/10 R shoulder pain during session, RN made aware. PT provided repositioning, rest breaks, and distraction as pain interventions throughout session.   Assessed R shoulder: pain on palpation at lateral acromion, pain onset with shoulder flexion/abduction to 90 deg and with internal rotation, 1/2 finger inferior subluxation, relief with [place]  Approximation and external rotation.   Patient's wife reports that the patient is determined to stretch his R arm with his L and may be pulling it over 90 degrees. Reinforced prior education about PROM <90 degrees due to risk of impingement without scapular activation, patient and his wife stated understanding, patient will need reinforcement with his wife is not present, nursing made aware.   Applied  Kinesiotape over anterior, middle, and posterior deltoids using structural support technique with 25-50% pull from insertion to origin with the aim of reducing inferior GH subluxation. Prior to application, patient denied any history of skin irritation or allergy to adhesive. Educated patient on purpose of kinesiotape placement and signs  symptoms of allergic reaction or irritation. Cleaned patient's skin and applied test strip at beginning of session and removed at end of session without sings of skin irritation. Instructed patient that the tape can be left on up to 3 days and can be worn in the shower. Instructed to removed the tape if peeling off, signs of skin irritation arise, or it has been on for >3 days. Informed patient that the tape is best removed in the shower or with a wet wash cloth. Patient stated understanding. Rehab team informed about tape placement to assist with monitoring patient's response.  Therapeutic Activity: Bed Mobility: Patient performed rolling R with mod A using L hand on bed rail following total A for bending L knee, and L with max-total A +2 x2 for peri-care due to urinary incontinence and doffing/donning incontinence brief. Applied anit-fungal powder between skin folds following peri-care. Laid in L side-lying with mod A to maintain position at PT applied kinesiotape over posterior deltoid. Provided verbal cues for turning his head and use of L hand during rolling.  Patient with B flexion synergy with light touch stimulation on the plantar surface of B feet L>R with several trials. Performed passive hip/knee ROM with cues to push/pull with mobility, patient unable to volitionally activate muscles with this movement. Educated patient and his wife on synergy patterns and benefits of tactile sensation in all 3 involved extremities. Patient fell asleep during activity.   Patient's wife reported that the patient was very uncomfortable in the TIS w/c yesterday due to prolonged waiting to return to bed with nursing. Will plan for sitting OOB between therapy sessions verses at end of therapies to improve sitting tolerance.   Patient in bed asleep with his wife at bedside at end of session with breaks locked, bed alarm set, and all needs within reach.Patient missed 7 min of skilled PT due to fatigue, RN made aware.  Will attempt to make-up missed time as able.      Therapy Documentation Precautions:  Precautions Precautions: Fall,Other (comment) Precaution Comments: Strong BLE extensor tone, hx of R hand tremor Restrictions Weight Bearing Restrictions: No General: PT Amount of Missed Time (min): 7 Minutes PT Missed Treatment Reason: Patient fatigue   Therapy/Group: Individual Therapy  Yazaira Speas L Lakynn Halvorsen PT, DPT  05/12/2020, 12:24 PM

## 2020-05-12 NOTE — Progress Notes (Signed)
Speech Language Pathology Daily Session Note  Patient Details  Name: Anthony Downs MRN: [id number] Date of Birth: 12/08/1950  Today's Date: 05/12/2020 SLP Individual Time: 1430-1505 SLP Individual Time Calculation (min): 35 min  Short Term Goals: Week 2: SLP Short Term Goal 1 (Week 2): Pt will recall novel/functional information with and without delay provided with supervision verbal cues SLP Short Term Goal 2 (Week 2): Pt will complete mildly complex problem solving and executive function tasks provided supervision verbal cues SLP Short Term Goal 3 (Week 2): Pt will participate in verbal fluency tasks (divergent naming, etc) with supervision verbal cues SLP Short Term Goal 4 (Week 2): Pt will understand, recall and utilize strategies to reduce frustration and improve communication at simple conversation level provided supervision verbal cues  Skilled Therapeutic Interventions: Skilled treatment session focused on communication goals. SLP facilitated session by providing education to the patient and his wife regarding word-finding strategies and how to maximize overall functional communication. Both verbalized understanding but also report the patient's decreased frustration tolerance can limit use of strategies at times. Patient also participated in an informal conversation about mildly abstract information with minimal word-finding deficits. Patient required extra time for communication and independently utilized his word-finding strategies X 1. Patient left upright in bed with alarm on and all needs within reach. Continue with current plan of care.      Pain Pain Assessment Pain Score: 0-No pain  Therapy/Group: Individual Therapy  Tekeyah Santiago 05/12/2020, 3:29 PM

## 2020-05-12 NOTE — Progress Notes (Signed)
PROGRESS NOTE   Subjective/Complaints: Complains of right sided shoulder pain-  Ordered voltaren gel.  Vitals stable.   Currently wet himself- called for assistance. Placed nursing order to request timed voiding q2H while awake.   ROS: Patient denies fever, rash, sore throat, blurred vision, nausea, vomiting, diarrhea, cough, shortness of breath or chest pain, headache, or mood change. +right sided shoulder pain   Objective:   No results found. No results for input(s): WBC, HGB, HCT, PLT in the last 72 hours. No results for input(s): NA, K, CL, CO2, GLUCOSE, BUN, CREATININE, CALCIUM in the last 72 hours.  Intake/Output Summary (Last 24 hours) at 05/12/2020 1032 Last data filed at 05/11/2020 2000 Gross per 24 hour  Intake 240 ml  Output --  Net 240 ml        Physical Exam: Vital Signs Blood pressure 127/86, pulse 72, temperature 98 F (36.7 C), temperature source Oral, resp. rate 20, height 6' (1.829 m), weight 133.5 kg, SpO2 97 %. Gen: no distress, normal appearing HEENT: oral mucosa pink and moist, NCAT Cardio: Reg rate Chest: normal effort, normal rate of breathing Abd: soft, non-distended Ext: no edema Psych: pleasant, normal affect Skin: posterior aspect of incision now dry.  Neurologic: Cranial nerves II through XII intact, motor strength is 5/5 in Left and 0/5 right deltoid, tricep, grip,trace-1/5 bicep. tr/5 bilateral hip flexor, knee extensors, ankle dorsiflexor and plantar flexor. Fair sitting balance with cueing.  Musculoskeletal:limited ROM with knee flexion and hip flexion. No joint swelling. Right sided shoulder with tenderness to palpation Ongoing increased extensor tone   Assessment/Plan: 1. Functional deficits which require 3+ hours per day of interdisciplinary therapy in a comprehensive inpatient rehab setting.  Physiatrist is providing close team supervision and 24 hour management of active medical  problems listed below.  Physiatrist and rehab team continue to assess barriers to discharge/monitor patient progress toward functional and medical goals  Care Tool:  Bathing    Body parts bathed by patient: Left arm,Chest,Abdomen,Face   Body parts bathed by helper: Right arm,Front perineal area,Buttocks,Right upper leg,Left upper leg,Right lower leg,Left lower leg     Bathing assist Assist Level: 2 Helpers     Upper Body Dressing/Undressing Upper body dressing   What is the patient wearing?: Pull over shirt    Upper body assist Assist Level: Total Assistance - Patient < 25%    Lower Body Dressing/Undressing Lower body dressing      What is the patient wearing?: Pants     Lower body assist Assist for lower body dressing: 2 Helpers     Toileting Toileting    Toileting assist Assist for toileting: 2 Helpers     Transfers Chair/bed transfer  Transfers assist  Chair/bed transfer activity did not occur: Safety/medical concerns  Chair/bed transfer assist level: Dependent - mechanical lift     Locomotion Ambulation   Ambulation assist   Ambulation activity did not occur: Safety/medical concerns          Walk 10 feet activity   Assist  Walk 10 feet activity did not occur: Safety/medical concerns        Walk 50 feet activity   Assist Walk 50 feet with  2 turns activity did not occur: Safety/medical concerns         Walk 150 feet activity   Assist Walk 150 feet activity did not occur: Safety/medical concerns         Walk 10 feet on uneven surface  activity   Assist Walk 10 feet on uneven surfaces activity did not occur: Safety/medical concerns         Wheelchair     Assist Will patient use wheelchair at discharge?: Yes (TBD but likely)             Wheelchair 50 feet with 2 turns activity    Assist    Wheelchair 50 feet with 2 turns activity did not occur: Safety/medical concerns       Wheelchair 150 feet  activity     Assist  Wheelchair 150 feet activity did not occur: Safety/medical concerns       Blood pressure 127/86, pulse 72, temperature 98 F (36.7 C), temperature source Oral, resp. rate 20, height 6' (1.829 m), weight 133.5 kg, SpO2 97 %.    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 Decadron taper -Patient may  Shower with cap -ELOS/Goals: 3-4 wks minA goals  -Continue CIR therapies including PT, OT, and SLP    -night splints at HS  - decadron taper to off 2. Antithrombotics: -Left posterior tibial vein DVT:  Vascular ultrasound positive for age indeterminate left posterior tibial vein.   -discussed with Dr. Zada Finders who ok'd anticoagulation   -continue xarelto. -antiplatelet therapy: N/A 3. Post-operative pain:tylenol prn  -add baclofen 10mg  qhs for cramps and to assist with sleep 4. Mood:Provide emotional support -antipsychotic agents: N/A 5. Neuropsych: This patientiscapable of making decisions on hisown behalf. 6. Skin/Wound Care:seems improved   -double sheet bed   -turn in bed 7. Fluids/Electrolytes/Nutrition:eating well     8. Hypertension. Lasix 20 mg daily   -Restarted home Flomax HS which will also help with BP   -no norvasc d/t swelling   -consider b-blocker if needed\   -4/25 well controlled: continue current regimen.  9. Hyperlipidemia. Lipitor 10. BPH: increased Flomax to 0.8mg  HS 11. Urinary frequency: foley out. Continent/incontinent  -UA negative.  timed voiding q2H while awake with urinal- placed nursing order   -Flomax to 0.8mg  given continued symptoms and elevated BP 12. Leukocytosis. Likely d/t steroids. Afebrile. No s/s of infection   -continue to follow   -tapering decadron to off 13.  Constipation:  -increased colace to senna-s bid  -moving bowels regularly 14. Right sided shoulder pain: voltaren gel ordered.  LOS: 10 days A FACE TO FACE EVALUATION WAS PERFORMED  Clide Deutscher Taunja Brickner 05/12/2020, 10:32 AM

## 2020-05-12 NOTE — Progress Notes (Signed)
Physical Therapy Session Note  Patient Details  Name: Anthony Downs MRN: [id number] Date of Birth: 08-Mar-1950  Today's Date: 05/12/2020 PT Individual Time: 1130-1200 PT Individual Time Calculation (min): 30 min   Short Term Goals: Week 1:  PT Short Term Goal 1 (Week 1): Pt will perform R/L rolling in bed with +2 mod assist using bed features PT Short Term Goal 2 (Week 1): Pt will tolerate sitting EOB for at least 15 minutes during therapy PT Short Term Goal 3 (Week 1): Pt will maintain dynamic sitting balance EOM with max assist for at least 3 minutes PT Short Term Goal 4 (Week 1): Pt will perform supine<>sitting with +2 max assist  Skilled Therapeutic Interventions/Progress Updates: Pt presents sleeping in bed and agrees to therapy.  Shorts threaded over feet and knees flexed by PT, pt able to pull shorts up to hips.  Pt requires assist of 2 for rolling side to side to complete pants over hips, spouse assisting.  Pt rolled to place lift pad for transfer OOB.  Pt required Total A for Maximove transfer to TIS.  Pt positioned for comfort.  Pt then tilted forward to allow leaning forward to thread RUE into shirt sleeve and pt the put LUE into sleeve and then pulled over head.  Pt required assist for completing pulling shirt down w/ verbal cues for maintaining forward lean.  Pt tilted back and remained in TIS.  Spouse to remain in room.      Therapy Documentation Precautions:  Precautions Precautions: Fall,Other (comment) Precaution Comments: Strong BLE extensor tone, hx of R hand tremor Restrictions Weight Bearing Restrictions: No General: PT Amount of Missed Time (min): 7 Minutes PT Missed Treatment Reason: Patient fatigue Vital Signs:   Pain:Pt c/o pain to R shoulder but unable to quantify, taped by PT, cream applied by nursing during rx. Pain Assessment Pain Score: 0-No pain Mobility:   L    Therapy/Group: Individual Therapy  Ladoris Gene 05/12/2020, 12:39 PM

## 2020-05-12 NOTE — Progress Notes (Signed)
Speech Language Pathology Daily Session Note  Patient Details  Name: Anthony Downs MRN: [id number] Date of Birth: Apr 18, 1950  Today's Date: 05/12/2020 SLP Individual Time: 0830-0900 SLP Individual Time Calculation (min): 30 min  Short Term Goals: Week 2: SLP Short Term Goal 1 (Week 2): Pt will recall novel/functional information with and without delay provided with supervision verbal cues SLP Short Term Goal 2 (Week 2): Pt will complete mildly complex problem solving and executive function tasks provided supervision verbal cues SLP Short Term Goal 3 (Week 2): Pt will participate in verbal fluency tasks (divergent naming, etc) with supervision verbal cues SLP Short Term Goal 4 (Week 2): Pt will understand, recall and utilize strategies to reduce frustration and improve communication at simple conversation level provided supervision verbal cues  Skilled Therapeutic Interventions:Skilled ST services focused on cognitive skills. Pt support's his wife manages all his medication. SLP facilitated mildly complex problem solving skills in account balancing task, pt demonstrated appropriate problem solving but required mod A fade to min verbal cues for recall and was resistant to use of written aids with non-dominate hand to aid in recall. In second account balancing task after education of need for written aids when working memory becomes changeling pt only required supervision A verbal cues to utilize written aid demonstrated mod I problem solving skills.These task were completed in a distracting environment nursing staff in and out and conversation in hallway, pt demonstrated mod I for selective attention, but expressed frustration at the end of the session with the added distraction. Pt was left in room with call bell within reach and bed alarm set. SLP recommends to continue skilled services.     Pain Pain Assessment Pain Score: 0-No pain  Therapy/Group: Individual Therapy  Henley Boettner   [facility] 05/12/2020, 12:27 PM

## 2020-05-13 MED ORDER — TRAZODONE HCL 50 MG PO TABS
50.0000 mg | ORAL_TABLET | Freq: Every day | ORAL | Status: DC
  Administered 2020-05-13 – 2020-06-16 (×35): 50 mg via ORAL
  Filled 2020-05-13 (×35): qty 1

## 2020-05-13 NOTE — Patient Care Conference (Signed)
Inpatient RehabilitationTeam Conference and Plan of Care Update Date: 05/13/2020   Time: 10:17 AM    Patient Name: Anthony Downs      Medical Record Number: [id number]  Date of Birth: February 13, 1950 Sex: Male         Room/Bed: 4W18C/4W18C-01 Payor Info: Payor: MEDICARE / Plan: MEDICARE PART A AND B / Product Type: *No Product type* /    Admit Date/Time:  05/02/2020  5:12 PM  Primary Diagnosis:  Brain tumor [facility])  Hospital Problems: Principal Problem:   Brain tumor [facility]) Active Problems:   Meningioma [facility])    Expected Discharge Date: Expected Discharge Date:  (3 weeks)  Team Members Present: Physician leading conference: Dr. Alger Simons Care Coodinator Present: Loralee Pacas, LCSWA;Letti Towell Creig Hines, RN, BSN, CRRN Nurse Present: Dorthula Nettles, RN PT Present: Apolinar Junes, PT OT Present: Laverle Hobby, OT SLP Present: Weston Anna, SLP PPS Coordinator present : Ileana Ladd, PT     Current Status/Progress Goal Weekly Team Focus  Bowel/Bladder   Patient is incontinent of bowel/bladder, on stool softener and laxatives schedule     Q 2 HR/PRN Toileting while awake   Swallow/Nutrition/ Hydration   No Goals  No Goals  No Goals   ADL's   max +2 ADLs, reflexive movements in the RUE, some improvement in sitting balance. Limited by fatigue and affect  Mod-max A overall  ADL retraining, RUE NMR, family edu   Mobility   Max A bed mobility of 1-2, min A sitting balance, dependent standing on tilt table, reflexive flexor synergy and extensor tone in B lower extremities, no volitional movement  Mod A overall, gait 25 ft max A  Activity tolerance, NMRof extremities and trunk control, tone management, sitting tolerance, functional mobility   Communication   Supervision-Min A  Mod I  word-finding strategies, verbal fluency   Safety/Cognition/ Behavioral Observations  Min A  Supervision  recall, attention and problem solving   Pain   Pain to right shoulder pain and  discomfort  Pain will remain <3.  QS/PRN assessment with   Skin   Surgical head incision intact and dry, no drainage, healing well  Skin will continue to heal and remain infection free.  Assess skin QS and prn     Discharge Planning:  Pt to d/c to home with 24/7 care from wife.   Team Discussion: BP is controlled, ongoing extensor tone in BLE's, RUE still weak. Sleep is an issue, Xarelto for DVT. Incontinent B/B, reports right shoulder pain, incision to the head is OTA.   Patient on target to meet rehab goals: Max assist +2, limited by fatigue, no active response to lower extremities, does have some reflective response. Drowsy during most all of the sessions. SLP session he is very fatigued. Need to schedule a family meeting.  *See Care Plan and progress notes for long and short-term goals.   Revisions to Treatment Plan:  MD is tapering off steroids.  Teaching Needs: Family education, medication management, pain management, skin/wound care, transfer training, gait training, balance training, endurance training, safety training, fatigue management.  Current Barriers to Discharge: Decreased caregiver support, Medical stability, Home enviroment access/layout, Incontinence, Lack of/limited family support, Medication compliance and Behavior  Possible Resolutions to Barriers: Continue current medications, provide emotional support.     Medical Summary Current Status: ongoing extensor tone in LE's. RUE still quite weak. sleep an issue a times. xarelto for DVT---tolerating. tapering off steroids  Barriers to Discharge: Medical stability   Possible Resolutions to [employer] Focus: daily  assessment of labs/pt data, steroid taper. sleep rx   Continued Need for Acute Rehabilitation Level of Care: The patient requires daily medical management by a physician with specialized training in physical medicine and rehabilitation for the following reasons: Direction of a multidisciplinary physical  rehabilitation program to maximize functional independence : Yes Medical management of patient stability for increased activity during participation in an intensive rehabilitation regime.: Yes Analysis of laboratory values and/or radiology reports with any subsequent need for medication adjustment and/or medical intervention. : Yes   I attest that I was present, lead the team conference, and concur with the assessment and plan of the team.   Cristi Loron 05/13/2020, 3:22 PM

## 2020-05-13 NOTE — Progress Notes (Signed)
Occupational Therapy Session Note  Patient Details  Name: Anthony Downs MRN: [id number] Date of Birth: 09/09/1950  Today's Date: 05/13/2020 OT Individual Time: 1300-1400 OT Individual Time Calculation (min): 60 min    Short Term Goals: Week 2:  OT Short Term Goal 1 (Week 2): Pt will maintain dynamic sitting balnace in min ranges outside BOS with MIN A overall for 5 min OT Short Term Goal 2 (Week 2): Pt will recall hemi dressing techniques wiht MOD VC OT Short Term Goal 3 (Week 2): Pt will complete 2 steps of UB dressing OT Short Term Goal 4 (Week 2): Pt will groom with MIN A  Skilled Therapeutic Interventions/Progress Updates:    Pt supine with c/o soreness from being in the TIS w/c for 2 hours. Pt completed bed mobility to the R with max +2 A. Pt completed sidelying to EOB with total A +2. Focused on sitting balance EOB with initially mod A, but then with mod cueing and facilitation for midline orientation he was able to maintain at [name]. Pt completed RUE NMR based activity with closed chain. Pt demonstrated R hand flexion in all fingers on command!! Pt was able to grasp functional objects with min facilitation! E-stim used on the R bicep to facilitate functional hand to mouth. Pt with 2/5 bicep activation. Pt c/o fatigue and returned to supine with max + 2 assist. Pt was given rest break and then assisted in R sidelying to promote visual attention for gravity eliminated RUE AAROM. Pt was left sidelying with all needs met bed alarm set and wife present.   1:1 NMES applied to R bicep to increase muscle activation. Ratio 1:3 Rate 35 pps Waveform- Asymmetric Ramp 1.0 Pulse 300 Intensity- 24 Duration -  15 min  Report of pain at the beginning of session 0/10 Report of pain at the end of session 0/10  No adverse reactions after treatment and is skin intact.   Therapy Documentation Precautions:  Precautions Precautions: Fall,Other (comment) Precaution Comments: Strong BLE extensor  tone, hx of R hand tremor Restrictions Weight Bearing Restrictions: No Therapy/Group: Individual Therapy  Curtis Sites 05/13/2020, 6:51 AM

## 2020-05-13 NOTE — Progress Notes (Signed)
Physical Therapy Session Note  Patient Details  Name: Anthony Downs MRN: [id number] Date of Birth: 1950/03/04  Today's Date: 05/13/2020 PT Individual Time: 3428-7681 PT Individual Time Calculation (min): 55 min   Short Term Goals: Week 1:  PT Short Term Goal 1 (Week 1): Pt will perform R/L rolling in bed with +2 mod assist using bed features PT Short Term Goal 1 - Progress (Week 1): Partly met (roll R with mod A +1, roll L with max A +2) PT Short Term Goal 2 (Week 1): Pt will tolerate sitting EOB for at least 15 minutes during therapy PT Short Term Goal 2 - Progress (Week 1): Progressing toward goal PT Short Term Goal 3 (Week 1): Pt will maintain dynamic sitting balance EOM with max assist for at least 3 minutes PT Short Term Goal 3 - Progress (Week 1): Progressing toward goal PT Short Term Goal 4 (Week 1): Pt will perform supine<>sitting with +2 max assist PT Short Term Goal 4 - Progress (Week 1): Met PT Short Term Goal 5 (Week 1): Pt will tolerate sitting EOB for at least 15 minutes during therapy Week 2:  PT Short Term Goal 1 (Week 2): Pt will maintain dynamic sitting balance EOM with max assist for at least 3 minutes PT Short Term Goal 2 (Week 2): Pt will perform bed mobility with max A +1 consistently. PT Short Term Goal 3 (Week 2): Patient will tolerate dependent lower extremity weight bearing >5 min using LRAD. PT Short Term Goal 4 (Week 2): Patient will initiate power mobility.  Skilled Therapeutic Interventions/Progress Updates:    pt received in bed and agreeable to therapy. Wife present. Pt reported he had urinated in brief, PT and wife assisted pt total A x2 for rolling and brief management and hygiene. Pt then directed in donning shorts, B socks and TED hoses total A, max A for donning shirt. Pt directed in rolling multiple times max A x2 for clothing management and lift pad placement. Pt required dependent lift for transfer to TIS WC. Once in [facility], pt required total A for  repositioning and placement for improved comfort. Pt directed in rhythmic rotation of BLE for improved positioning and decrease tone for placement at leg rests. Pt left in TIS [facility] with wife present, All needs in reach and in good condition. Call light in hand.    Therapy Documentation Precautions:  Precautions Precautions: Fall,Other (comment) Precaution Comments: Strong BLE extensor tone, hx of R hand tremor Restrictions Weight Bearing Restrictions: No General:   Vital Signs:   Pain:   Mobility:   Locomotion :    Trunk/Postural Assessment :    Balance:   Exercises:   Other Treatments:      Therapy/Group: Individual Therapy  Junie Panning 05/13/2020, 1:16 PM

## 2020-05-13 NOTE — Progress Notes (Signed)
Physical Therapy Session Note  Patient Details  Name: Anthony Downs MRN: [id number] Date of Birth: 1950/03/27  Today's Date: 05/13/2020 PT Individual Time: 1430-1525 PT Individual Time Calculation (min): 55 min   Short Term Goals: Week 2:  PT Short Term Goal 1 (Week 2): Pt will maintain dynamic sitting balance EOM with max assist for at least 3 minutes PT Short Term Goal 2 (Week 2): Pt will perform bed mobility with max A +1 consistently. PT Short Term Goal 3 (Week 2): Patient will tolerate dependent lower extremity weight bearing >5 min using LRAD. PT Short Term Goal 4 (Week 2): Patient will initiate power mobility.  Skilled Therapeutic Interventions/Progress Updates:     Patient in bed upon PT arrival. Patient agreeable to PT session with variable levels of arousal throughout session due to fatigue this afternoon. Wife reports he was in the TIS w/c >2 hours today and was very tired from it. Patient denied pain during session, reports improved R shoulder pain with kinesiotape placement yesterday. Also reports bowl incontinence at beginning of session.  Therapeutic Activity: Bed Mobility: Patient performed rolling L with mod A and rolling R with max-total A +2 to change incontinence brief and perform peri-care with total A. Applied barrier cream to buttocks following peri-care for improved skin integrity. Provided verbal cues for reaching L hand to rail and turning his head to roll. Performed PROM hip/knee flexion/extension to promote flexion for hook-lying to roll to reduce extensor tone.  Patient able to reproduce grasp performed in OT session x5 with visual demonstration from PT. Patient unable to demonstrate muscle activation in either lower extremity with PROM or PNF tapping to promote motor activation. Trials guided imagery of motor recruitment with functional activities (standing, kicking, squatting), patient unable to visualize activities per report and exercise was terminated.    Consulted another PT on use of NMES for lower extremity motor recruitment, will trial later this week. Applied B tension adjustable night splints with total A  +2 and placed signs in the room for nursing staff on directions for donning splints to patient's tolerance and directions to reduce extensor tone with bed mobility and donning splints.    Discussed use of power w/c for improved sitting tolerance and increased independence with pressure relief and locomotion with patient and his wife. Both in agreement to begin trials tomorrow.   Patient in bed with his wife in the room at end of session with breaks locked, bed alarm set, and all needs within reach.    Therapy Documentation Precautions:  Precautions Precautions: Fall,Other (comment) Precaution Comments: Strong BLE extensor tone, hx of R hand tremor Restrictions Weight Bearing Restrictions: No   Therapy/Group: Individual Therapy  Tremayne Sheldon L Julieann Drummonds PT, DPT  05/13/2020, 5:00 PM

## 2020-05-13 NOTE — Progress Notes (Signed)
Patient ID: Anthony Downs, male   DOB: 1950-03-09, 70 y.o.   MRN: [id number]   SW met with pt and pt wife in room to provide updates from team conference, and ELOS 3 weeks. SW will continue to provide updates after team conference.   Loralee Pacas, MSW, [place] Office: [phone] Cell: [phone] Fax: [phone]

## 2020-05-13 NOTE — Progress Notes (Addendum)
PROGRESS NOTE   Subjective/Complaints: Says he's still not sleeping well. A little discouraged by slow progress. Therapy reports fatigue during their sessions with him.  ROS: Limited due to cognitive/behavioral    Objective:   No results found. No results for input(s): WBC, HGB, HCT, PLT in the last 72 hours. No results for input(s): NA, K, CL, CO2, GLUCOSE, BUN, CREATININE, CALCIUM in the last 72 hours.  Intake/Output Summary (Last 24 hours) at 05/13/2020 1214 Last data filed at 05/13/2020 1540 Gross per 24 hour  Intake 560 ml  Output --  Net 560 ml        Physical Exam: Vital Signs Blood pressure 138/75, pulse 93, temperature 98.2 F (36.8 C), temperature source Oral, resp. rate 20, height 6' (1.829 m), weight 133.5 kg, SpO2 92 %. Constitutional: No distress . Vital signs reviewed. HEENT: EOMI, oral membranes moist Neck: supple Cardiovascular: RRR without murmur. No JVD    Respiratory/Chest: CTA Bilaterally without wheezes or rales. Normal effort    GI/Abdomen: BS +, non-tender, non-distended Ext: no clubbing, cyanosis, or edema Psych: pleasant and cooperative, flat Skin: posterior aspect of incision now dry.  Neurologic: Cranial nerves II through XII intact, motor strength is 5/5 in Left and 0/5 right deltoid, tricep, grip,trace-1/5 bicep. tr/5 bilateral hip flexor, knee extensors, ankle dorsiflexor and plantar flexor.---no motor changes.  Delayed, apraxic speech, thought processing.  Musculoskeletal:limited ROM with knee flexion and hip flexion. No joint swelling. Right sided shoulder with tenderness to palpation still.  Ongoing increased extensor tone   Assessment/Plan: 1. Functional deficits which require 3+ hours per day of interdisciplinary therapy in a comprehensive inpatient rehab setting.  Physiatrist is providing close team supervision and 24 hour management of active medical problems listed  below.  Physiatrist and rehab team continue to assess barriers to discharge/monitor patient progress toward functional and medical goals  Care Tool:  Bathing    Body parts bathed by patient: Left arm,Chest,Abdomen,Face   Body parts bathed by helper: Right arm,Front perineal area,Buttocks,Right upper leg,Left upper leg,Right lower leg,Left lower leg     Bathing assist Assist Level: 2 Helpers     Upper Body Dressing/Undressing Upper body dressing   What is the patient wearing?: Pull over shirt    Upper body assist Assist Level: Total Assistance - Patient < 25%    Lower Body Dressing/Undressing Lower body dressing      What is the patient wearing?: Pants     Lower body assist Assist for lower body dressing: 2 Helpers     Toileting Toileting    Toileting assist Assist for toileting: 2 Helpers     Transfers Chair/bed transfer  Transfers assist  Chair/bed transfer activity did not occur: Safety/medical concerns  Chair/bed transfer assist level: Dependent - mechanical lift     Locomotion Ambulation   Ambulation assist   Ambulation activity did not occur: Safety/medical concerns          Walk 10 feet activity   Assist  Walk 10 feet activity did not occur: Safety/medical concerns        Walk 50 feet activity   Assist Walk 50 feet with 2 turns activity did not occur: Safety/medical concerns  Walk 150 feet activity   Assist Walk 150 feet activity did not occur: Safety/medical concerns         Walk 10 feet on uneven surface  activity   Assist Walk 10 feet on uneven surfaces activity did not occur: Safety/medical concerns         Wheelchair     Assist Will patient use wheelchair at discharge?: Yes (TBD but likely)             Wheelchair 50 feet with 2 turns activity    Assist    Wheelchair 50 feet with 2 turns activity did not occur: Safety/medical concerns       Wheelchair 150 feet activity      Assist  Wheelchair 150 feet activity did not occur: Safety/medical concerns       Blood pressure 138/75, pulse 93, temperature 98.2 F (36.8 C), temperature source Oral, resp. rate 20, height 6' (1.829 m), weight 133.5 kg, SpO2 92 %.    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 Decadron taper -Patient may  Shower with cap -ELOS/Goals: 3-4 wks minA goals  -Continue CIR therapies including PT, OT, and SLP ---team conf   -night splints at HS  - decadron tapered to off 2. Antithrombotics: -Left posterior tibial vein DVT:  Vascular ultrasound positive for age indeterminate left posterior tibial vein.   -discussed with Dr. Zada Finders who ok'd anticoagulation   -continue xarelto. -antiplatelet therapy: N/A 3. Post-operative pain:tylenol prn  -voltaren gel for right shoulder  -addedbaclofen 10mg  qhs for cramps and to assist with sleep 4. Mood/sleep: didn't take trazodone last noc  -schedule trazodone, consider melatonin at HS as well. rx pain, on baclofen too -antipsychotic agents: N/A 5. Neuropsych: This patientiscapable of making decisions on hisown behalf. 6. Skin/Wound Care:seems improved   -double sheet bed   -turn in bed 7. Fluids/Electrolytes/Nutrition:eating well     8. Hypertension. Lasix 20 mg daily   -Restarted home Flomax HS which will also help with BP   -no norvasc d/t swelling   -consider b-blocker if needed   -4/26 well controlled: continue current regimen.  9. Hyperlipidemia. Lipitor 10. BPH: increased Flomax to 0.8mg  HS 11. Urinary frequency: foley out. Continent/incontinent  -UA negative.  timed voiding q2H while awake with urinal- placed nursing order   -Flomax to 0.8mg  given continued symptoms and elevated BP 12.  Leukocytosis. Likely d/t steroids. Afebrile. No s/s of infection   -continue to follow   -tapering decadron to off 13. Constipation:  -increased colace to senna-s bid  -moving bowels regularly now.  LOS: 11 days A FACE TO FACE EVALUATION WAS PERFORMED  [name] 05/13/2020, 12:14 PM

## 2020-05-14 NOTE — Plan of Care (Signed)
  Problem: RH Expression Communication Goal: LTG Patient will verbally express basic/complex needs(SLP) Description: LTG:  Patient will verbally express basic/complex needs, wants or ideas with cues  (SLP) Flowsheets (Taken 05/14/2020 [phone]) LTG: Patient will verbally express basic/complex needs, wants or ideas (SLP): Supervision Note: Goal downgraded due to fluctuation in verbal expression

## 2020-05-14 NOTE — Progress Notes (Signed)
Speech Language Pathology Daily Session Note  Patient Details  Name: Anthony Downs MRN: [id number] Date of Birth: 03-06-1950  Today's Date: 05/14/2020 SLP Individual Time: 7654-6503 SLP Individual Time Calculation (min): 45 min  Short Term Goals: Week 2: SLP Short Term Goal 1 (Week 2): Pt will recall novel/functional information with and without delay provided with supervision verbal cues SLP Short Term Goal 2 (Week 2): Pt will complete mildly complex problem solving and executive function tasks provided supervision verbal cues SLP Short Term Goal 3 (Week 2): Pt will participate in verbal fluency tasks (divergent naming, etc) with supervision verbal cues SLP Short Term Goal 4 (Week 2): Pt will understand, recall and utilize strategies to reduce frustration and improve communication at simple conversation level provided supervision verbal cues  Skilled Therapeutic Interventions: Skilled SLP intervention focused on cognition. Pt required increased time to communicate that he did not recieve his breakfast tray this morning. Divergent naming task with categories on moderate complexity completed with min A verbal cues to name more than 5 items for categories. Descriptive naming task compled with \\patient  describing objects and people to family member for him to guess. He required verbal cues with SLP directing patient to state relevant details. Simple calendar organization task completed with min A verbal cues. Cont with therapy per plan of care. Pt left seated upright in bed with cousin present in room.      Pain Pain Assessment Pain Scale: Faces Faces Pain Scale: No hurt  Therapy/Group: Individual Therapy  Anthony Downs Anthony Downs 05/14/2020, 9:08 AM
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
## 2020-05-14 NOTE — Progress Notes (Signed)
PROGRESS NOTE   Subjective/Complaints: Slept much better last night. Room is too hot for him this morning. Feels he's getting stronger  ROS: Patient denies fever, rash, sore throat, blurred vision, nausea, vomiting, diarrhea, cough, shortness of breath or chest pain,  or mood change.   Objective:   No results found. No results for input(s): WBC, HGB, HCT, PLT in the last 72 hours. No results for input(s): NA, K, CL, CO2, GLUCOSE, BUN, CREATININE, CALCIUM in the last 72 hours.  Intake/Output Summary (Last 24 hours) at 05/14/2020 0847 Last data filed at 05/13/2020 2300 Gross per 24 hour  Intake 500 ml  Output 400 ml  Net 100 ml        Physical Exam: Vital Signs Blood pressure (!) 143/79, pulse 80, temperature 97.9 F (36.6 C), resp. rate 18, height 6' (1.829 m), weight 133.5 kg, SpO2 96 %. Constitutional: No distress . Vital signs reviewed. HEENT: EOMI, oral membranes moist Neck: supple Cardiovascular: RRR without murmur. No JVD    Respiratory/Chest: CTA Bilaterally without wheezes or rales. Normal effort    GI/Abdomen: BS +, non-tender, non-distended Ext: no clubbing, cyanosis, or edema Psych: pleasant   Skin: scalp incision CDI.  Neurologic: Cranial nerves II through XII intact, motor strength is 5/5 in Left and 0/5 right deltoid, bicep, grip,trace to 1/5. tr/5 bilateral hip flexor, knee extensors, ankle dorsiflexor and plantar flexor.---no motor changes.  apraxic  Musculoskeletal:limited ROM with knee flexion and hip flexion. No joint swelling. Right sided shoulder with tenderness to palpation still.  Ongoing increased extensor tone   Assessment/Plan: 1. Functional deficits which require 3+ hours per day of interdisciplinary therapy in a comprehensive inpatient rehab setting.  Physiatrist is providing close team supervision and 24 hour management of active medical problems listed below.  Physiatrist and rehab team  continue to assess barriers to discharge/monitor patient progress toward functional and medical goals  Care Tool:  Bathing    Body parts bathed by patient: Left arm,Chest,Abdomen,Face   Body parts bathed by helper: Right arm,Front perineal area,Buttocks,Right upper leg,Left upper leg,Right lower leg,Left lower leg     Bathing assist Assist Level: 2 Helpers     Upper Body Dressing/Undressing Upper body dressing   What is the patient wearing?: Pull over shirt    Upper body assist Assist Level: Total Assistance - Patient < 25%    Lower Body Dressing/Undressing Lower body dressing      What is the patient wearing?: Pants     Lower body assist Assist for lower body dressing: 2 Helpers     Toileting Toileting    Toileting assist Assist for toileting: 2 Helpers     Transfers Chair/bed transfer  Transfers assist  Chair/bed transfer activity did not occur: Safety/medical concerns  Chair/bed transfer assist level: Dependent - mechanical lift     Locomotion Ambulation   Ambulation assist   Ambulation activity did not occur: Safety/medical concerns          Walk 10 feet activity   Assist  Walk 10 feet activity did not occur: Safety/medical concerns        Walk 50 feet activity   Assist Walk 50 feet with 2 turns activity  did not occur: Safety/medical concerns         Walk 150 feet activity   Assist Walk 150 feet activity did not occur: Safety/medical concerns         Walk 10 feet on uneven surface  activity   Assist Walk 10 feet on uneven surfaces activity did not occur: Safety/medical concerns         Wheelchair     Assist Will patient use wheelchair at discharge?: Yes (TBD but likely)             Wheelchair 50 feet with 2 turns activity    Assist    Wheelchair 50 feet with 2 turns activity did not occur: Safety/medical concerns       Wheelchair 150 feet activity     Assist  Wheelchair 150 feet activity  did not occur: Safety/medical concerns       Blood pressure (!) 143/79, pulse 80, temperature 97.9 F (36.6 C), resp. rate 18, height 6' (1.829 m), weight 133.5 kg, SpO2 96 %.    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 Decadron taper -Patient may  Shower with cap -ELOS/Goals: 3-4 wks minA goals  -Continue CIR therapies including PT, OT, and SLP ---team conf   -night splints at HS    2. Antithrombotics: -Left posterior tibial vein DVT:  Vascular ultrasound positive for age indeterminate left posterior tibial vein.   -continue xarelto. -antiplatelet therapy: N/A 3. Post-operative pain:tylenol prn  -voltaren gel for right shoulder  -addedbaclofen 10mg  qhs for cramps and to assist with sleep 4. Mood/sleep:  improved  -continue scheduled trazodone, consider melatonin at HS as well  - rx pain, on baclofen too -antipsychotic agents: N/A 5. Neuropsych: This patientiscapable of making decisions on hisown behalf. 6. Skin/Wound Care:rash resolved   -double sheeting bed   -turn in bed 7. Fluids/Electrolytes/Nutrition:eating well     8. Hypertension. Lasix 20 mg daily   -Restarted home Flomax HS which will also help with BP   -no norvasc d/t swelling   -consider b-blocker if needed   -4/27 well controlled: continue current regimen.  9. Hyperlipidemia. Lipitor 10. BPH: increased Flomax to 0.8mg  HS 11. Urinary frequency: foley out. Continent/incontinent  -UA negative.  timed voiding q2H while awake with urinal- placed nursing order   -Flomax to 0.8mg  given continued symptoms and elevated BP 12. Leukocytosis. Likely d/t steroids. Afebrile. No s/s of infection   -continue to follow   -tapering decadron to off 13. Constipation:  -increased colace to  senna-s bid  -moving bowels regularly now.  LOS: 12 days A FACE TO FACE EVALUATION WAS PERFORMED  [name] 05/14/2020, 8:47 AM

## 2020-05-14 NOTE — Progress Notes (Signed)
Physical Therapy Session Note  Patient Details  Name: Anthony Downs MRN: [id number] Date of Birth: Jun 29, 1950  Today's Date: 05/14/2020 PT Individual Time: 1100-1215 and 1245-1300 PT Individual Time Calculation (min): 75 min and 15 min  Short Term Goals: Week 2:  PT Short Term Goal 1 (Week 2): Pt will maintain dynamic sitting balance EOM with max assist for at least 3 minutes PT Short Term Goal 2 (Week 2): Pt will perform bed mobility with max A +1 consistently. PT Short Term Goal 3 (Week 2): Patient will tolerate dependent lower extremity weight bearing >5 min using LRAD. PT Short Term Goal 4 (Week 2): Patient will initiate power mobility.  Skilled Therapeutic Interventions/Progress Updates:     Patient in bed with his cousin in the room upon PT arrival. Patient alert and agreeable to PT session. Patient denied pain during session, continues to report improved shoulder pain since kinesiotape applied, plan to re-apply tomorrow.  Therapeutic Activity: Bed Mobility: Donned shorts and non-skid socks total A bed level. Patient performed rolling with mod-max A +2. Patient performed supine to sit with max A of 1 person and SBA from a second person for safety. Provided verbal cues for rolling L and setting L elbow to push up. Transfers: Performed dependent lift transfer bed>power w/c using Maxi Move with +2 assist.  Neuromuscular Re-ed: Patient performed the following activities: -sitting EOB >20 min with mod-supervision for sitting balance with cues for forward weight shift to reduce posterior bias, required patient to perform forward trunk lean outside of BOS to grasp his cup to take a drink x3, placed estim pads on anterior tib motor points and set-up for PreMod stim for motor recruitment for DF/eversion with 5 on 5 off cycles 2x5-6 min, adjusted pads x1, unable to produce motor recruitment for full ROM, but noted partial or twitch response ~50% of the time Patient denied electronic or  metal implants and reported good response from upper extremity estim with OT prior to application.  Patient donned shirt with mod A sitting EOB with min-mod A for sitting balance  Patient in power w/c with his wife and cousin, applied gait belt with padding a knees to reduced hip external rotation/abduction in sitting, at end of session with breaks locked, seat belt secured, and all needs within reach. Patient concerned about sitting up an not being able to get back to bed. Per NT, patient required 3 people and increased difficulty to return to bed yesterday due to body habitus and extensor tone. PT agreed to return after patient ate lunch to get him back to bed.  Returned 30 min later to return patient to bed. Performed dependent lift transfer power w/c>bed using Maxi Move 1 person assist. Patient performed rolling with mod-max A with use of bed rails to remove lift pad. Patient in bed with his wife and cousin in the room with breaks locked, bed alarm set, and all needs in reach.   Therapy Documentation Precautions:  Precautions Precautions: Fall,Other (comment) Precaution Comments: Strong BLE extensor tone, hx of R hand tremor Restrictions Weight Bearing Restrictions: No   Therapy/Group: Individual Therapy  Anthony Downs PT, DPT  05/14/2020, 9:13 PM

## 2020-05-14 NOTE — Progress Notes (Signed)
Occupational Therapy Session Note  Patient Details  Name: AIRAM HEIDECKER MRN: [id number] Date of Birth: Jan 21, 1950  Today's Date: 05/14/2020 OT Individual Time: 1450-1600 OT Individual Time Calculation (min): 70 min    Short Term Goals: Week 1:  OT Short Term Goal 1 (Week 1): Pt will roll with MAX A of 1 caregiver to decrease BOC for toileitng OT Short Term Goal 1 - Progress (Week 1): Met OT Short Term Goal 2 (Week 1): Pt will tolerate laying sidelying on R 1x per day to assist with tone management OT Short Term Goal 2 - Progress (Week 1): Met OT Short Term Goal 3 (Week 1): Pt will recall hemi dressing techniques with MOD VC OT Short Term Goal 3 - Progress (Week 1): Progressing toward goal OT Short Term Goal 4 (Week 1): Pt will sit EOB 10 min with mod A in prep for ADL OT Short Term Goal 4 - Progress (Week 1): Met  Skilled Therapeutic Interventions/Progress Updates:    1:1. Pt received in bed agreeable to OT. Pt and wife present for session. Ot retrieves saebo for estim on R wrist and educates pt and family on use for NMR, decreasing risk of muscle atrophy as well as risks. Pt with good wrist extension response. End of stim session 15 min after OT terminated session and skin in tact/pt tolerated well. Pt sup>sit with MAX A of 1 with +2 gauarding. Pt sits EOB ~15-20 min with initial MOD A to sit up and find midline with cuing to use LUE on either knee to help adjust trunk. Overall S after ~2 min with intermittent CGA. trialed shoulder/elbow flex/ext seated EOB as well as sidelying on L. No palpable contractions noticed at this time in either postion. Scap mobilizations performed in all planes for NMR/tactile input provided to RUE. Returned to supine HOB elevated.   Saebo Stim One 330 pulse width 35 Hz pulse rate On 8 sec/ off 8 sec Ramp up/ down 2 sec Symmetrical Biphasic wave form  Max intensity 195m at 500 Ohm load  Exited session with pt seated in bed, exit alarm on and call  light in reach   Therapy Documentation Precautions:  Precautions Precautions: Fall,Other (comment) Precaution Comments: Strong BLE extensor tone, hx of R hand tremor Restrictions Weight Bearing Restrictions: No General:   Vital Signs: Therapy Vitals Temp: 97.9 F (36.6 C) Pulse Rate: 80 Resp: 18 BP: (!) 143/79 Patient Position (if appropriate): Lying Oxygen Therapy SpO2: 96 % O2 Device: Room Air Pain:   ADL: ADL Grooming: Moderate assistance (per pt report) Where Assessed-Grooming: Bed level Upper Body Bathing: Moderate assistance Where Assessed-Upper Body Bathing: Bed level Lower Body Bathing: Dependent (+2 rolling) Where Assessed-Lower Body Bathing: Bed level Upper Body Dressing: Dependent (+2 for rolling to pull down back) Where Assessed-Upper Body Dressing: Bed level Lower Body Dressing: Dependent Where Assessed-Lower Body Dressing: Bed level Toileting: Unable to assess Vision   Perception    Praxis   Exercises:   Other Treatments:     Therapy/Group: Individual Therapy  STonny Branch4/27/2022, 6:55 AM

## 2020-05-15 NOTE — Progress Notes (Signed)
Patient ID: Anthony Downs, male   DOB: Jul 15, 1950, 70 y.o.   MRN: [id number]   [facility] PASRR#: [id number] [place], MSW, [place] Office: [phone] Cell: [phone] Fax: [phone]

## 2020-05-15 NOTE — Progress Notes (Signed)
PROGRESS NOTE   Subjective/Complaints: Sleeping much better. Had a good day with therapy yesterday also. Felt less nervous/fidgety.    ROS: Limited due to cognitive/behavioral   Objective:   No results found. No results for input(s): WBC, HGB, HCT, PLT in the last 72 hours. No results for input(s): NA, K, CL, CO2, GLUCOSE, BUN, CREATININE, CALCIUM in the last 72 hours.  Intake/Output Summary (Last 24 hours) at 05/15/2020 1028 Last data filed at 05/15/2020 0900 Gross per 24 hour  Intake 680 ml  Output 1 ml  Net 679 ml        Physical Exam: Vital Signs Blood pressure (!) 142/71, pulse 66, temperature 98.3 F (36.8 C), temperature source Oral, resp. rate 18, height 6' (1.829 m), weight 133.5 kg, SpO2 98 %. Constitutional: No distress . Vital signs reviewed. HEENT: EOMI, oral membranes moist Neck: supple Cardiovascular: RRR without murmur. No JVD    Respiratory/Chest: CTA Bilaterally without wheezes or rales. Normal effort    GI/Abdomen: BS +, non-tender, non-distended Ext: no clubbing, cyanosis, or edema Psych: pleasant and cooperative Skin: scalp incision clean.  Neurologic: Cranial nerves II through XII intact, motor strength is 5/5 in Left and 0/5 right deltoid, bicep, grip 1/5. tr/5 bilateral hip flexor, knee extensors, 0/5 ankle dorsiflexor and plantar flexor.--apraxic. Continued extensor tone Musculoskeletal:limited ROM with knee flexion and hip flexion. No joint swelling. Right sided shoulder with tenderness to palpation still.      Assessment/Plan: 1. Functional deficits which require 3+ hours per day of interdisciplinary therapy in a comprehensive inpatient rehab setting.  Physiatrist is providing close team supervision and 24 hour management of active medical problems listed below.  Physiatrist and rehab team continue to assess barriers to discharge/monitor patient progress toward functional and medical  goals  Care Tool:  Bathing    Body parts bathed by patient: Left arm,Chest,Abdomen,Face   Body parts bathed by helper: Right arm,Front perineal area,Buttocks,Right upper leg,Left upper leg,Right lower leg,Left lower leg     Bathing assist Assist Level: 2 Helpers     Upper Body Dressing/Undressing Upper body dressing   What is the patient wearing?: Pull over shirt    Upper body assist Assist Level: Total Assistance - Patient < 25%    Lower Body Dressing/Undressing Lower body dressing      What is the patient wearing?: Pants     Lower body assist Assist for lower body dressing: 2 Helpers     Toileting Toileting    Toileting assist Assist for toileting: 2 Helpers     Transfers Chair/bed transfer  Transfers assist  Chair/bed transfer activity did not occur: Safety/medical concerns  Chair/bed transfer assist level: Dependent - mechanical lift     Locomotion Ambulation   Ambulation assist   Ambulation activity did not occur: Safety/medical concerns          Walk 10 feet activity   Assist  Walk 10 feet activity did not occur: Safety/medical concerns        Walk 50 feet activity   Assist Walk 50 feet with 2 turns activity did not occur: Safety/medical concerns         Walk 150 feet activity   Assist  Walk 150 feet activity did not occur: Safety/medical concerns         Walk 10 feet on uneven surface  activity   Assist Walk 10 feet on uneven surfaces activity did not occur: Safety/medical concerns         Wheelchair     Assist Will patient use wheelchair at discharge?: Yes (TBD but likely)             Wheelchair 50 feet with 2 turns activity    Assist    Wheelchair 50 feet with 2 turns activity did not occur: Safety/medical concerns       Wheelchair 150 feet activity     Assist  Wheelchair 150 feet activity did not occur: Safety/medical concerns       Blood pressure (!) 142/71, pulse 66, temperature  98.3 F (36.8 C), temperature source Oral, resp. rate 18, height 6' (1.829 m), weight 133.5 kg, SpO2 98 %.    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 Decadron taper -Patient may  Shower with cap -ELOS/Goals: 3-4 wks minA goals  -Continue CIR therapies including PT, OT, and SLP    -night splints at HS to stretch heel cords    2. Antithrombotics: -Left posterior tibial vein DVT:  Vascular ultrasound positive for age indeterminate left posterior tibial vein.   -continue xarelto. -antiplatelet therapy: N/A 3. Post-operative pain:tylenol prn  -voltaren gel for right shoulder  -addedbaclofen 10mg  qhs for cramps and to assist with sleep 4. Mood/sleep:  improved  -improved with scheduled trazodone, consider melatonin at HS as well  - rx pain, on baclofen too -antipsychotic agents: N/A 5. Neuropsych: This patientiscapable of making decisions on hisown behalf. 6. Skin/Wound Care:rash resolved   -double sheeting bed   -turn in bed 7. Fluids/Electrolytes/Nutrition:eating well     8. Hypertension. Lasix 20 mg daily   -Restarted home Flomax HS which will also help with BP   -no norvasc d/t swelling   -consider b-blocker if needed   -4/28 well controlled: continue current regimen.  9. Hyperlipidemia. Lipitor 10. BPH: increased Flomax to 0.8mg  HS 11. Urinary frequency: foley out. Continent/incontinent  -UA negative.  timed voiding q2H while awake with urinal- placed nursing order   -Flomax to 0.8mg  given continued symptoms and elevated BP 12. Leukocytosis. Likely d/t steroids. Afebrile. No s/s of infection   -continue to follow   -  decadron  off 13. Constipation:  -increased colace to senna-s bid  -moving bowels regularly now.  LOS: 13 days A  FACE TO FACE EVALUATION WAS PERFORMED  [name] 05/15/2020, 10:28 AM

## 2020-05-15 NOTE — Progress Notes (Signed)
Physical Therapy Session Note  Patient Details  Name: Anthony Downs MRN: [id number] Date of Birth: Aug 07, 1950  Today's Date: 05/15/2020 PT Individual Time: 0915-1015 PT Individual Time Calculation (min): 60 min   Short Term Goals: Week 2:  PT Short Term Goal 1 (Week 2): Pt will maintain dynamic sitting balance EOM with max assist for at least 3 minutes PT Short Term Goal 2 (Week 2): Pt will perform bed mobility with max A +1 consistently. PT Short Term Goal 3 (Week 2): Patient will tolerate dependent lower extremity weight bearing >5 min using LRAD. PT Short Term Goal 4 (Week 2): Patient will initiate power mobility.  Skilled Therapeutic Interventions/Progress Updates:     Patient in bed upon PT and Lattie Haw, RT, arrival. Patient alert and agreeable to co-treat this session. Patient denied pain during session.  Therapeutic Activity: Bed Mobility: Patient performed rolling R/L with max A +2 to don shorts with total A. Provided total A for bending knees to hook-lying prior to rolling, maintained knee elevation using bed features to prevent extensor tone prior to bending knees. Cued patient to turn his head and use L upper extremity to assist with rolling. Noted improved incorporation of trunk rotation this session. Performed supine to sit with max A of 1 with a second person SBA. Provided verbal cues for setting his L elbow and using that arm to push up to sitting. Noted heavier max A today versus yesterday due to patient just waking up. Patient sat EOB with min A-supervision, donned shirt with mod A and placed lift sling with max A for lateral leans.  Transfers: Performed dependent transfer sitting EOB>power w/c using Maxi-move with +2 assist.   RT continued session with discussion of hobbies and activities the patient did PTA, as PT retrieved lateral supports and knee block to trial for improved hip abduction in sitting. Unable to place lateral thigh supports due to patient's body habitus.  Adjusted knee block to fit patient with hips in neutral and improved sitting posture and tolerance. Demonstrated how to remove knee block to NT in the event patient needed to return to the bed prior to his next therapy session. Adjusted patient's head rest for improved head support in tilt to improve sitting tolerance and use of pressure relief with power tilt feature. Did not switch power controls to L side for patient to be able to perform pressure relief due to time constraints. Will inform next therapist to address this.   Patient in power w/c reporting improved tolerance and positioning sitting in power w/c at end of session with breaks locked, seat belt secured, and all needs within reach.    Therapy Documentation Precautions:  Precautions Precautions: Fall,Other (comment) Precaution Comments: Strong BLE extensor tone, hx of R hand tremor Restrictions Weight Bearing Restrictions: No   Therapy/Group: Individual Therapy  Shanah Guimaraes L Campbell Kray PT, DPT  05/15/2020, 6:02 PM

## 2020-05-15 NOTE — Progress Notes (Signed)
Occupational Therapy Session Note  Patient Details  Name: Anthony Downs MRN: [id number] Date of Birth: 1950-12-25  Today's Date: 05/15/2020 OT Individual Time: 1130-1155 OT Individual Time Calculation (min): 25 min   And 45 min 1300-1400    Short Term Goals: Week 1:  OT Short Term Goal 1 (Week 1): Pt will roll with MAX A of 1 caregiver to decrease BOC for toileitng OT Short Term Goal 1 - Progress (Week 1): Met OT Short Term Goal 2 (Week 1): Pt will tolerate laying sidelying on R 1x per day to assist with tone management OT Short Term Goal 2 - Progress (Week 1): Met OT Short Term Goal 3 (Week 1): Pt will recall hemi dressing techniques with MOD VC OT Short Term Goal 3 - Progress (Week 1): Progressing toward goal OT Short Term Goal 4 (Week 1): Pt will sit EOB 10 min with mod A in prep for ADL OT Short Term Goal 4 - Progress (Week 1): Met  Skilled Therapeutic Interventions/Progress Updates:    1:1. Pt received in bed agreeable to OT. Pt completes Sup>sit EOB with MAX A but much improved lift of trunk with LUE to push up to midline. MIN A overall initially to maintain balance initially supervision after 30 sec. Pt completes lateral leans side to side for WB into B forearms for NMR with MOD A to elevate to midline. Pt returns to supine and applied saebo to R deltoid.   Saebo Stim One 330 pulse width 35 Hz pulse rate On 8 sec/ off 8 sec Ramp up/ down 2 sec Symmetrical Biphasic wave form  Max intensity 12mA at 500 Ohm load Skin in tact at end of session.  Exited session with pt seated in bed, exit alarm on and call light in reach   Session 2: Pt received in bed agreeable to OT. Pt with changing levels of arousal. Initial plan to get OOB, hwoever pt too fatigued and agreeable to attempt to work on [place]. Obtained slide sheet and pt positioned in sidelying and supine for attempted activation of shoulder/elbow. Overall despite multiple positions/attempts unable to achieve even trace  activation. Pt with poor focused attention and processing impacting participation. scap mobilization provided. Pt unable to maintain visual fixation on hand d/t poor arousal. Session terminated early d/t pt fatigue missing 15 min. Exited session with pt seated in bed, exit alarm on and call light in reach    Therapy Documentation Precautions:  Precautions Precautions: Fall,Other (comment) Precaution Comments: Strong BLE extensor tone, hx of R hand tremor Restrictions Weight Bearing Restrictions: No General:   Vital Signs: Therapy Vitals Temp: 98.3 F (36.8 C) Temp Source: Oral Pulse Rate: 66 Resp: 18 BP: (!) 142/71 Patient Position (if appropriate): Lying Oxygen Therapy SpO2: 98 % O2 Device: Room Air Pain:   ADL: ADL Grooming: Moderate assistance (per pt report) Where Assessed-Grooming: Bed level Upper Body Bathing: Moderate assistance Where Assessed-Upper Body Bathing: Bed level Lower Body Bathing: Dependent (+2 rolling) Where Assessed-Lower Body Bathing: Bed level Upper Body Dressing: Dependent (+2 for rolling to pull down back) Where Assessed-Upper Body Dressing: Bed level Lower Body Dressing: Dependent Where Assessed-Lower Body Dressing: Bed level Toileting: Unable to assess Vision   Perception    Praxis   Exercises:   Other Treatments:     Therapy/Group: Individual Therapy  [name] 05/15/2020, 6:53 AM

## 2020-05-16 LAB — CBC
HCT: 37.4 % — ABNORMAL LOW (ref 39.0–52.0)
Hemoglobin: 12.1 g/dL — ABNORMAL LOW (ref 13.0–17.0)
MCH: 31 pg (ref 26.0–34.0)
MCHC: 32.4 g/dL (ref 30.0–36.0)
MCV: 95.9 fL (ref 80.0–100.0)
Platelets: 245 10*3/uL (ref 150–400)
RBC: 3.9 MIL/uL — ABNORMAL LOW (ref 4.22–5.81)
RDW: 13.1 % (ref 11.5–15.5)
WBC: 8.6 10*3/uL (ref 4.0–10.5)
nRBC: 0 % (ref 0.0–0.2)

## 2020-05-16 LAB — BASIC METABOLIC PANEL
Anion gap: 8 (ref 5–15)
BUN: 15 mg/dL (ref 8–23)
CO2: 27 mmol/L (ref 22–32)
Calcium: 9 mg/dL (ref 8.9–10.3)
Chloride: 100 mmol/L (ref 98–111)
Creatinine, Ser: 0.91 mg/dL (ref 0.61–1.24)
GFR, Estimated: >60 mL/min (ref >60)
Glucose, Bld: 119 mg/dL — ABNORMAL HIGH (ref 70–99)
Potassium: 3.7 mmol/L (ref 3.5–5.1)
Sodium: 135 mmol/L (ref 135–145)

## 2020-05-16 NOTE — Progress Notes (Signed)
Speech Language Pathology Weekly Progress and Session Note  Patient Details  Name: Anthony Downs MRN: [id number] Date of Birth: 02-11-1950  Beginning of progress report period: May 09, 2020 End of progress report period: May 16, 2020  Today's Date: 05/16/2020 SLP Individual Time: 1117-1203 SLP Individual Time Calculation (min): 46 min  Short Term Goals: Week 2: SLP Short Term Goal 1 (Week 2): Pt will recall novel/functional information with and without delay provided with supervision verbal cues SLP Short Term Goal 1 - Progress (Week 2): Not met SLP Short Term Goal 2 (Week 2): Pt will complete mildly complex problem solving and executive function tasks provided supervision verbal cues SLP Short Term Goal 2 - Progress (Week 2): Met SLP Short Term Goal 3 (Week 2): Pt will participate in verbal fluency tasks (divergent naming, etc) with supervision verbal cues SLP Short Term Goal 3 - Progress (Week 2): Not met SLP Short Term Goal 4 (Week 2): Pt will understand, recall and utilize strategies to reduce frustration and improve communication at simple conversation level provided supervision verbal cues SLP Short Term Goal 4 - Progress (Week 2): Not met    New Short Term Goals: Week 3: SLP Short Term Goal 1 (Week 3): Pt will recall novel/functional information with use of memory compensaotry strategies with supervision verbal cues SLP Short Term Goal 2 (Week 3): Pt will complete mildly complex problem solving and executive function tasks with Mod I. SLP Short Term Goal 3 (Week 3): Pt will participate in verbal fluency tasks (divergent naming, etc) with supervision verbal cues SLP Short Term Goal 4 (Week 3): Pt will understand, recall and utilize strategies to reduce frustration and improve communication at simple conversation level provided supervision verbal cues SLP Short Term Goal 5 (Week 3): Pt will demosntrate selective attention to functional tasks in a mildly distracting  enviornment for 45 mintues with supervision verbal cues for redirection.  Weekly Progress Updates: Patient has made functional but inconsistent gains and has met 2 of 4 STGs this reporting period. Currently, patient's overall function is impacted by fatigue and pain. When patient is lethargic or in pain, patient demonstrates decreased verbal fluency and word-finding. Patient also requires increased cueing for attention and recall to functional tasks. However, when patient is alert and feeling well, patient requires overall supervision level verbal cues to complete mildly complex and functional  tasks safely in regards to memory, attention and problem solving. Patient also demonstrates improved verbal fluency and word-finding requiring only extra time. Patient and family education ongoing. Patient would benefit from continued skilled SLP intervention to maximize his cognitive-linguistic functioning prior to discharge.     Intensity: Minumum of 1-2 x/day, 30 to 90 minutes Frequency: 3 to 5 out of 7 days Duration/Length of Stay: 3 weeks Treatment/Interventions: Cognitive remediation/compensation;Therapeutic Activities;Therapeutic Exercise;Internal/external aids;Medication managment;Patient/family education;Cueing hierarchy;Functional tasks   Daily Session  Skilled Therapeutic Interventions: Skilled ST services focused on language skills. SLP facilitated divergent naming of animals, originally able to name 12 within a minute increasing to 14 with supervision A subcategory cues. Pt demonstrated divergent naming in 10/10 opportunities with supervision A semantic cues and extra time. Pt demonstrated good carryover of word finding strategies in communication barrier task with supervision A category and description. Pt was left in room with call bell within reach and bed alarm set. Recommend to continue ST services.      Pain Pain Assessment Pain Scale: 0-10 Pain Score: Asleep Pain Type: Chronic pain Pain  Location: Shoulder Pain Orientation: Right Pain Frequency:  Intermittent Pain Intervention(s): Medication (See eMAR)  Therapy/Group: Individual Therapy  PAYNE, COURTNEY 05/16/2020, 6:25 AM

## 2020-05-16 NOTE — Progress Notes (Signed)
Physical Therapy Session Note  Patient Details  Name: RAYNALDO FALCO MRN: [id number] Date of Birth: 1950/10/10  Today's Date: 05/16/2020 PT Individual Time: 0920-1010 PT Individual Time Calculation (min): 50 min   Short Term Goals: Week 2:  PT Short Term Goal 1 (Week 2): Pt will maintain dynamic sitting balance EOM with max assist for at least 3 minutes PT Short Term Goal 2 (Week 2): Pt will perform bed mobility with max A +1 consistently. PT Short Term Goal 3 (Week 2): Patient will tolerate dependent lower extremity weight bearing >5 min using LRAD. PT Short Term Goal 4 (Week 2): Patient will initiate power mobility.  Skilled Therapeutic Interventions/Progress Updates:     Pt received seated in power WC. Agrees to therapy. Reports pain in low back "folding up in chair". PT repositions pt and tilts WC back to relieve pressure on low back and pt verbalizes improvement in symptoms. PT provides steering for power chair to direct pt to gym. PT provides assistance to adjust power chair and leg blocks to properly fit pt. PT attempts to adjust laterality of pt's "drive function" but is unable to change from R to L. Upon return to room pt requesting to return to bed. PT provides total A to transfer [facility] from [facility] to bed with [name]. Pt performs trunk flexion with maxA +2 assist with removing sling. Positioned for comfort and pressure relief. Left with alarm intact and all needs within reach.  Therapy Documentation Precautions:  Precautions Precautions: Fall,Other (comment) Precaution Comments: Strong BLE extensor tone, hx of R hand tremor Restrictions Weight Bearing Restrictions: No   Therapy/Group: Individual Therapy  Breck Coons, PT, DPT 05/16/2020, 12:56 PM

## 2020-05-16 NOTE — Progress Notes (Signed)
PROGRESS NOTE   Subjective/Complaints: Reports left shoulder pain now. Woke up with soreness this morning. Thinks he might be doing too much with arm. Right shoulder also still tender but not as much.   ROS: Patient denies fever, rash, sore throat, blurred vision, nausea, vomiting, diarrhea, cough, shortness of breath or chest pain, joint or back pain, headache, or mood change.    Objective:   No results found. Recent Labs    05/16/20 0519  WBC 8.6  HGB 12.1*  HCT 37.4*  PLT 245   Recent Labs    05/16/20 0519  NA 135  K 3.7  CL 100  CO2 27  GLUCOSE 119*  BUN 15  CREATININE 0.91  CALCIUM 9.0    Intake/Output Summary (Last 24 hours) at 05/16/2020 1223 Last data filed at 05/16/2020 5732 Gross per 24 hour  Intake 960 ml  Output 1 ml  Net 959 ml        Physical Exam: Vital Signs Blood pressure (!) 115/97, pulse 78, temperature 98 F (36.7 C), resp. rate 14, height 6' (1.829 m), weight 133.5 kg, SpO2 97 %. Constitutional: No distress . Vital signs reviewed. HEENT: EOMI, oral membranes moist Neck: supple Cardiovascular: RRR without murmur. No JVD    Respiratory/Chest: CTA Bilaterally without wheezes or rales. Normal effort    GI/Abdomen: BS +, non-tender, non-distended Ext: no clubbing, cyanosis, or edema Psych: pleasant and cooperative Skin: scalp incision clean, dry.  Neurologic: Cranial nerves II through XII intact, motor strength is 5/5 in Left and 0/5 right deltoid, bicep, grip 1/5. tr/5 bilateral hip flexor, knee extensors, 0/5 ankle dorsiflexor and plantar flexor.--apraxic. Continued extensor tone. Reflexive movements only in LE's Musculoskeletal: right shoulder sl tender with IR/ER without significant sublux. Left shoulder tender in acromial area with IR/ER.      Assessment/Plan: 1. Functional deficits which require 3+ hours per day of interdisciplinary therapy in a comprehensive inpatient rehab  setting.  Physiatrist is providing close team supervision and 24 hour management of active medical problems listed below.  Physiatrist and rehab team continue to assess barriers to discharge/monitor patient progress toward functional and medical goals  Care Tool:  Bathing    Body parts bathed by patient: Left arm,Chest,Abdomen,Face   Body parts bathed by helper: Right arm,Front perineal area,Buttocks,Right upper leg,Left upper leg,Right lower leg,Left lower leg     Bathing assist Assist Level: 2 Helpers     Upper Body Dressing/Undressing Upper body dressing   What is the patient wearing?: Pull over shirt    Upper body assist Assist Level: Total Assistance - Patient < 25%    Lower Body Dressing/Undressing Lower body dressing      What is the patient wearing?: Pants     Lower body assist Assist for lower body dressing: 2 Helpers     Toileting Toileting    Toileting assist Assist for toileting: 2 Helpers     Transfers Chair/bed transfer  Transfers assist  Chair/bed transfer activity did not occur: Safety/medical concerns  Chair/bed transfer assist level: Dependent - mechanical lift     Locomotion Ambulation   Ambulation assist   Ambulation activity did not occur: Safety/medical concerns  Walk 10 feet activity   Assist  Walk 10 feet activity did not occur: Safety/medical concerns        Walk 50 feet activity   Assist Walk 50 feet with 2 turns activity did not occur: Safety/medical concerns         Walk 150 feet activity   Assist Walk 150 feet activity did not occur: Safety/medical concerns         Walk 10 feet on uneven surface  activity   Assist Walk 10 feet on uneven surfaces activity did not occur: Safety/medical concerns         Wheelchair     Assist Will patient use wheelchair at discharge?: Yes (TBD but likely)             Wheelchair 50 feet with 2 turns activity    Assist    Wheelchair 50  feet with 2 turns activity did not occur: Safety/medical concerns       Wheelchair 150 feet activity     Assist  Wheelchair 150 feet activity did not occur: Safety/medical concerns       Blood pressure (!) 115/97, pulse 78, temperature 98 F (36.7 C), resp. rate 14, height 6' (1.829 m), weight 133.5 kg, SpO2 97 %.    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 Decadron taper -Patient may  Shower with cap -ELOS/Goals: 3-4 wks minA goals  -Continue CIR therapies including PT, OT, and SLP    -night splints at HS to stretch heel cords    2. Antithrombotics: -Left posterior tibial vein DVT:  Vascular ultrasound positive for age indeterminate left posterior tibial vein.   -continue xarelto. -antiplatelet therapy: N/A 3. Post-operative pain:tylenol prn  -voltaren gel for right shoulder---will rx left shoulder as well   -work on appropriate techniques, ROM with therapy  -addedbaclofen 10mg  qhs for cramps and to assist with sleep 4. Mood/sleep:  improved  -improved with scheduled trazodone 50mg ---continue  - rx pain, on baclofen too -antipsychotic agents: N/A 5. Neuropsych: This patientiscapable of making decisions on hisown behalf. 6. Skin/Wound Care:rash resolved   -double sheeting bed   -turn in bed 7. Fluids/Electrolytes/Nutrition:eating well     8. Hypertension. Lasix 20 mg daily   -Restarted home Flomax HS which will also help with BP   -no norvasc d/t swelling   -consider b-blocker if needed   -4/29 fair control: continue current regimen.  9. Hyperlipidemia. Lipitor 10. BPH: increased Flomax to 0.8mg  HS 11. Urinary frequency: foley out. Continent/incontinent  -UA negative.  timed voiding q2H while awake with urinal- placed nursing  order   -Flomax to 0.8mg  given continued symptoms and elevated BP 12. Leukocytosis. Likely d/t steroids. Afebrile. No s/s of infection   -continue to follow   -  decadron  off 13. Constipation:  -increased colace to senna-s bid  -moving bowels regularly now. 4/28  LOS: 14 days A FACE TO FACE EVALUATION WAS PERFORMED  [name] 05/16/2020, 12:23 PM

## 2020-05-16 NOTE — Progress Notes (Signed)
Physical Therapy Session Note  Patient Details  Name: Anthony Downs MRN: [id number] Date of Birth: 1950/05/22  Today's Date: 05/16/2020 PT Individual Time: 1300-1400 PT Individual Time Calculation (min): 60 min   Short Term Goals: Week 2:  PT Short Term Goal 1 (Week 2): Pt will maintain dynamic sitting balance EOM with max assist for at least 3 minutes PT Short Term Goal 2 (Week 2): Pt will perform bed mobility with max A +1 consistently. PT Short Term Goal 3 (Week 2): Patient will tolerate dependent lower extremity weight bearing >5 min using LRAD. PT Short Term Goal 4 (Week 2): Patient will initiate power mobility.  Skilled Therapeutic Interventions/Progress Updates:   Pt received supine in bed and agreeable to PT. Donning of tedhose in bed with total A Max assist for rolling R and L to don maximove sling. maximove transfer to [facility]. Pt transported to day room in [facility]. maximove transfer to tilt table. Pt transitioned to 40deg and 50deg with 5 min hold at each. Pt reports no dizziness in standing, mild stretch in R calf noted by pt, but no pain reported. Pt returned to room and performed maxi move transfer to bed with total A. Pt left supine in bed with call bell in reach and all needs met.       Therapy Documentation Precautions:  Precautions Precautions: Fall,Other (comment) Precaution Comments: Strong BLE extensor tone, hx of R hand tremor Restrictions Weight Bearing Restrictions: No Pain: Pain Assessment Pain Score: 0-No pain    Therapy/Group: Individual Therapy  Lorie Phenix 05/16/2020, 6:36 PM

## 2020-05-16 NOTE — Progress Notes (Signed)
Occupational Therapy Session Note  Patient Details  Name: Anthony Downs MRN: [id number] Date of Birth: 08/01/1950  Today's Date: 05/16/2020 OT Individual Time: 0800-0900 OT Individual Time Calculation (min): 60 min    Short Term Goals: Week 1:  OT Short Term Goal 1 (Week 1): Pt will roll with MAX A of 1 caregiver to decrease BOC for toileitng OT Short Term Goal 1 - Progress (Week 1): Met OT Short Term Goal 2 (Week 1): Pt will tolerate laying sidelying on R 1x per day to assist with tone management OT Short Term Goal 2 - Progress (Week 1): Met OT Short Term Goal 3 (Week 1): Pt will recall hemi dressing techniques with MOD VC OT Short Term Goal 3 - Progress (Week 1): Progressing toward goal OT Short Term Goal 4 (Week 1): Pt will sit EOB 10 min with mod A in prep for ADL OT Short Term Goal 4 - Progress (Week 1): Met  Skilled Therapeutic Interventions/Progress Updates:     Pt received in bed agreeable to OT with no pain. Pt agreeable to getting into chair.  ADL:  Pt completes UB dressing with MAX A with HOH facilitation of RUE to pull sleebe down LUE-pt able to grasp shirt and trace elbow activation felt Pt completes LB dressing with +2 A only for clothing managemnet/sling placement. Pt able ot roll with MOD-MAX A in B direcsions Pt completes footwear with total A to don non skid sock  Therapeutic activity OT applies K tape to R shoulder to decrease risk of sublux and for tactile stimuli for deltoid activation. Pt tolerates well and had no adverse raction to taping prior sessions,  Saebo Stim One 330 pulse width 35 Hz pulse rate On 8 sec/ off 8 sec Ramp up/ down 2 sec Symmetrical Biphasic wave form  Max intensity 110mA at 500 Ohm load 60 min unattended estim to R wrist extensors with good response and skin in tact at end of session.  Pt left at end of session in PWC tilted back, safety belt on and knee blockers in place, call light in reach and all needs met   Therapy  Documentation Precautions:  Precautions Precautions: Fall,Other (comment) Precaution Comments: Strong BLE extensor tone, hx of R hand tremor Restrictions Weight Bearing Restrictions: No General:   Vital Signs: Therapy Vitals Temp: 98 F (36.7 C) Pulse Rate: 78 Resp: 14 BP: (!) 115/97 Patient Position (if appropriate): Lying Oxygen Therapy SpO2: 97 % O2 Device: Room Air Pain: Pain Assessment Pain Scale: 0-10 Pain Score: Asleep Pain Type: Chronic pain Pain Location: Shoulder Pain Orientation: Right Pain Frequency: Intermittent Pain Intervention(s): Medication (See eMAR) ADL: ADL Grooming: Moderate assistance (per pt report) Where Assessed-Grooming: Bed level Upper Body Bathing: Moderate assistance Where Assessed-Upper Body Bathing: Bed level Lower Body Bathing: Dependent (+2 rolling) Where Assessed-Lower Body Bathing: Bed level Upper Body Dressing: Dependent (+2 for rolling to pull down back) Where Assessed-Upper Body Dressing: Bed level Lower Body Dressing: Dependent Where Assessed-Lower Body Dressing: Bed level Toileting: Unable to assess Vision   Perception    Praxis   Exercises:   Other Treatments:     Therapy/Group: Individual Therapy   M  05/16/2020, 6:50 AM 

## 2020-05-17 MED ORDER — MAGNESIUM CITRATE PO SOLN
1.0000 | Freq: Once | ORAL | Status: AC
  Administered 2020-05-17: 1 via ORAL
  Filled 2020-05-17: qty 296

## 2020-05-17 NOTE — Progress Notes (Signed)
Occupational Therapy Weekly Progress Note  Patient Details  Name: Anthony Downs MRN: [id number] Date of Birth: May 25, 1950  Beginning of progress report period: May 10, 2020 End of progress report period: May 17, 2020   Patient has met 3 of 4 short term goals.  Pt has made slow progress this reporting period improving sitting balance to S and bed mobility MOD-MAX A depending on direction. Pt is able to sit EOB statically with S more consistently using grasp on Les/bed rail to maintain midline. Pt is demoinstrating improvements in RUE with grasp and release and is able to activate RUE trace in shoulder and elbow with bimanual tasks and functional tasks. Pt continues to demo significant fatigue/decreased endurance/arousal impacting participation in tx at times. Pt continues to require max-total A with self care and use of maxi move for transfers.    Patient continues to demonstrate the following deficits: muscle weakness, decreased cardiorespiratoy endurance, impaired timing and sequencing, abnormal tone, unbalanced muscle activation, motor apraxia, decreased coordination and decreased motor planning, decreased visual perceptual skills, decreased midline orientation and decreased attention to right, decreased initiation, decreased attention, decreased awareness, decreased problem solving, decreased safety awareness, decreased memory and delayed processing and decreased sitting balance, decreased standing balance, decreased postural control and decreased balance strategies and therefore will continue to benefit from skilled OT intervention to enhance overall performance with BADL and iADL.  Patient progressing toward long term goals..  Continue plan of care.  OT Short Term Goals Week 2:  OT Short Term Goal 1 (Week 2): Pt will maintain dynamic sitting balnace in min ranges outside BOS with MIN A overall for 5 min OT Short Term Goal 1 - Progress (Week 2): Met OT Short Term Goal 2 (Week 2): Pt  will recall hemi dressing techniques wiht MOD VC OT Short Term Goal 2 - Progress (Week 2): Met OT Short Term Goal 3 (Week 2): Pt will complete 2 steps of UB dressing OT Short Term Goal 3 - Progress (Week 2): Progressing toward goal OT Short Term Goal 4 (Week 2): Pt will groom with MIN A OT Short Term Goal 4 - Progress (Week 2): Met Week 3:  OT Short Term Goal 1 (Week 3): Pt will complete 2/4 steps of UB dressing OT Short Term Goal 2 (Week 3): pt will mange trunk from supine>sit with MOD A consistently OT Short Term Goal 3 (Week 3): Pt will use RUE as stabilizer with MIN A consistently   [name] 05/17/2020, 6:51 AM

## 2020-05-17 NOTE — Progress Notes (Signed)
Speech Language Pathology Daily Session Note  Patient Details  Name: LINH JOHANNES MRN: [id number] Date of Birth: Oct 03, 1950  Today's Date: 05/15/2020 SLP Individual Time: 4270-6237 Time Calculation: 30 min  Skilled Therapeutic Interventions: Skilled treatment session focused on cognitive goals. SLP facilitated session by providing extra time and overall supervision level verbal cues for attention and Mod I for problem solving during a complex medication management task. Patient left upright in wheelchair with alarm on and all needs within reach. Continue with current plan of care.      Pain No/Denies Pain   Therapy/Group: Individual Therapy  Overton Boggus 05/17/2020, 12:35 PM

## 2020-05-17 NOTE — Progress Notes (Signed)
PROGRESS NOTE   Subjective/Complaints: Says "I need you to give me something to make me poop." Discussed magnesium citrate.  No other complaints.  Wife is helping him to use urinal.     ROS: Patient denies fever, rash, sore throat, blurred vision, nausea, vomiting, diarrhea, cough, shortness of breath or chest pain, joint or back pain, headache, or mood change. +movement in his fingers!   Objective:   No results found. Recent Labs    05/16/20 0519  WBC 8.6  HGB 12.1*  HCT 37.4*  PLT 245   Recent Labs    05/16/20 0519  NA 135  K 3.7  CL 100  CO2 27  GLUCOSE 119*  BUN 15  CREATININE 0.91  CALCIUM 9.0    Intake/Output Summary (Last 24 hours) at 05/17/2020 1250 Last data filed at 05/17/2020 0900 Gross per 24 hour  Intake 740 ml  Output 200 ml  Net 540 ml        Physical Exam: Vital Signs Blood pressure (!) 152/89, pulse 83, temperature 97.9 F (36.6 C), temperature source Oral, resp. rate 18, height 6' (1.829 m), weight 133.5 kg, SpO2 95 %. Gen: no distress, normal appearing HEENT: oral mucosa pink and moist, NCAT Cardio: Reg rate Chest: normal effort, normal rate of breathing Abd: soft, non-distended Ext: no edema Psych: pleasant, normal affect Skin: scalp incision clean, dry.  Neurologic: Cranial nerves II through XII intact, motor strength is 5/5 in Left and 0/5 right deltoid, bicep, grip 1/5. tr/5 bilateral hip flexor, knee extensors, 0/5 ankle dorsiflexor and plantar flexor.--apraxic. Continued extensor tone. Reflexive movements only in LE's Musculoskeletal: right shoulder sl tender with IR/ER without significant sublux. Left shoulder tender in acromial area with IR/ER.      Assessment/Plan: 1. Functional deficits which require 3+ hours per day of interdisciplinary therapy in a comprehensive inpatient rehab setting.  Physiatrist is providing close team supervision and 24 hour management of active  medical problems listed below.  Physiatrist and rehab team continue to assess barriers to discharge/monitor patient progress toward functional and medical goals  Care Tool:  Bathing    Body parts bathed by patient: Left arm,Chest,Abdomen,Face   Body parts bathed by helper: Right arm,Front perineal area,Buttocks,Right upper leg,Left upper leg,Right lower leg,Left lower leg     Bathing assist Assist Level: 2 Helpers     Upper Body Dressing/Undressing Upper body dressing   What is the patient wearing?: Pull over shirt    Upper body assist Assist Level: Total Assistance - Patient < 25%    Lower Body Dressing/Undressing Lower body dressing      What is the patient wearing?: Pants     Lower body assist Assist for lower body dressing: 2 Helpers     Toileting Toileting    Toileting assist Assist for toileting: 2 Helpers     Transfers Chair/bed transfer  Transfers assist  Chair/bed transfer activity did not occur: Safety/medical concerns  Chair/bed transfer assist level: Dependent - mechanical lift     Locomotion Ambulation   Ambulation assist   Ambulation activity did not occur: Safety/medical concerns          Walk 10 feet activity   Assist  Walk 10 feet activity did not occur: Safety/medical concerns        Walk 50 feet activity   Assist Walk 50 feet with 2 turns activity did not occur: Safety/medical concerns         Walk 150 feet activity   Assist Walk 150 feet activity did not occur: Safety/medical concerns         Walk 10 feet on uneven surface  activity   Assist Walk 10 feet on uneven surfaces activity did not occur: Safety/medical concerns         Wheelchair     Assist Will patient use wheelchair at discharge?: Yes (TBD but likely)             Wheelchair 50 feet with 2 turns activity    Assist    Wheelchair 50 feet with 2 turns activity did not occur: Safety/medical concerns       Wheelchair 150  feet activity     Assist  Wheelchair 150 feet activity did not occur: Safety/medical concerns       Blood pressure (!) 152/89, pulse 83, temperature 97.9 F (36.6 C), temperature source Oral, resp. rate 18, height 6' (1.829 m), weight 133.5 kg, SpO2 95 %.    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 Decadron taper -Patient may  Shower with cap -ELOS/Goals: 3-4 wks minA goals  -Continue CIR therapies including PT, OT, and SLP    -night splints at HS to stretch heel cords 2. Antithrombotics: -Left posterior tibial vein DVT:  Vascular ultrasound positive for age indeterminate left posterior tibial vein.   -continue xarelto. -antiplatelet therapy: N/A 3. Post-operative pain:tylenol prn  -voltaren gel for right shoulder---will rx left shoulder as well   -work on appropriate techniques, ROM with therapy  -addedbaclofen 10mg  qhs for cramps and to assist with sleep  -discussed with wife that she can get blue emu oil for him 4. Mood/sleep:  improved  -improved with scheduled trazodone 50mg ---continue  - rx pain, on baclofen too -antipsychotic agents: N/A 5. Neuropsych: This patientiscapable of making decisions on hisown behalf. 6. Skin/Wound Care:rash resolved   -double sheeting bed   -turn in bed 7. Fluids/Electrolytes/Nutrition:eating well     8. Hypertension. Lasix 20 mg daily   -Restarted home Flomax HS which will also help with BP   -no norvasc d/t swelling   -consider b-blocker if needed   -4/30 fair control: continue current regimen. Provided a list of foods that can help to lower BP.  9. Hyperlipidemia. Lipitor 10. BPH: increased Flomax to 0.8mg  HS 11. Urinary frequency: foley out. Continent/incontinent  -UA negative.  timed voiding  q2H while awake with urinal- placed nursing order   -Flomax to 0.8mg  given continued symptoms and elevated BP 12. Leukocytosis. Likely d/t steroids. Afebrile. No s/s of infection   -continue to follow   -  decadron  off 13. Constipation:  -increased colace to senna-s bid  -magnesium citrate ordered 4/30  LOS: 15 days A FACE TO [place] Amrita Radu 05/17/2020, 12:50 PM

## 2020-05-18 MED ORDER — FUROSEMIDE 40 MG PO TABS
40.0000 mg | ORAL_TABLET | Freq: Every day | ORAL | Status: DC
  Administered 2020-05-19 – 2020-05-20 (×2): 40 mg via ORAL
  Filled 2020-05-18 (×2): qty 1

## 2020-05-18 MED ORDER — MELATONIN 3 MG PO TABS
3.0000 mg | ORAL_TABLET | Freq: Every day | ORAL | Status: DC
  Administered 2020-05-18 – 2020-06-09 (×23): 3 mg via ORAL
  Filled 2020-05-18 (×23): qty 1

## 2020-05-18 NOTE — Progress Notes (Signed)
Occupational Therapy Session Note  Patient Details  Name: Anthony Downs MRN: [id number] Date of Birth: 31-Mar-1950  Today's Date: 05/18/2020 OT Individual Time: 4098-1191 OT Individual Time Calculation (min): 87 min    Short Term Goals: Week 3:  OT Short Term Goal 1 (Week 3): Pt will complete 2/4 steps of UB dressing OT Short Term Goal 2 (Week 3): pt will mange trunk from supine>sit with MOD A consistently OT Short Term Goal 3 (Week 3): Pt will use RUE as stabilizer with MIN A consistently  Skilled Therapeutic Interventions/Progress Updates:    Pt supine with no c/o pain. Mod HOH facilitation provided for pt's RUE to hold toothbrush for the LUE to apply toothpaste. A wash mitt was applied to pt's R hand and he was guided through [facility] UB bathing. He had trace activation in his biceps and triceps and was able to active a loose gross grasp for washing. He required total facilitation to lift up his RUE for the LUE to wash underneath. He rolled R and L with mod, max A respectively. Less extensor tone present in the RLE this session. Pt then transferred to EOB with total +2 assist. Once EOB he was able to maintain sitting balance with supervision. He donned shirt with mod A. He sat EOB for several minutes working on static sitting balance before returning to supine. Maximove transfer from supine to power w/c. Total +2 assist to adjust pt's hip and shoulders in chair. Power steering was on the R instead of L so OT took over navigation of chair to the therapy gym. PT assisted in changing over joystick to the L side for pt accessibility. Pt was left in gym with PT working on chair.   Therapy Documentation Precautions:  Precautions Precautions: Fall,Other (comment) Precaution Comments: Strong BLE extensor tone, hx of R hand tremor Restrictions Weight Bearing Restrictions: No   Therapy/Group: Individual Therapy  Curtis Sites 05/18/2020, 6:13 AM

## 2020-05-18 NOTE — Progress Notes (Signed)
PROGRESS NOTE   Subjective/Complaints: Difficulty falling asleep at night, agreeable to trying melatonin 3mg  Had BM last night after mag citrate. Has pain in bilateral shoulders, well controlled with voltaren gel Participated well with OT today.     ROS: Patient denies fever, rash, sore throat, blurred vision, nausea, vomiting, diarrhea, cough, shortness of breath or chest pain, joint or back pain, headache, or mood change, constipation. +movement in his fingers!   Objective:   No results found. Recent Labs    05/16/20 0519  WBC 8.6  HGB 12.1*  HCT 37.4*  PLT 245   Recent Labs    05/16/20 0519  NA 135  K 3.7  CL 100  CO2 27  GLUCOSE 119*  BUN 15  CREATININE 0.91  CALCIUM 9.0    Intake/Output Summary (Last 24 hours) at 05/18/2020 1240 Last data filed at 05/18/2020 8299 Gross per 24 hour  Intake 260 ml  Output --  Net 260 ml        Physical Exam: Vital Signs Blood pressure (!) 164/76, pulse 68, temperature 98 F (36.7 C), temperature source Oral, resp. rate 16, height 6' (1.829 m), weight 133.5 kg, SpO2 98 %. Gen: no distress, normal appearing HEENT: oral mucosa pink and moist, NCAT Cardio: Reg rate Chest: normal effort, normal rate of breathing Abd: soft, non-distended Ext: no edema Psych: pleasant, normal affect Skin: scalp incision clean, dry.  Neurologic: Cranial nerves II through XII intact, motor strength is 5/5 in Left and 0/5 right deltoid, bicep, grip 1/5. tr/5 bilateral hip flexor, knee extensors, 0/5 ankle dorsiflexor and plantar flexor.--apraxic. Continued extensor tone. Reflexive movements only in LE's Musculoskeletal: right shoulder sl tender with IR/ER without significant sublux. Left shoulder tender in acromial area with IR/ER.      Assessment/Plan: 1. Functional deficits which require 3+ hours per day of interdisciplinary therapy in a comprehensive inpatient rehab  setting.  Physiatrist is providing close team supervision and 24 hour management of active medical problems listed below.  Physiatrist and rehab team continue to assess barriers to discharge/monitor patient progress toward functional and medical goals  Care Tool:  Bathing    Body parts bathed by patient: Left arm,Chest,Abdomen,Face   Body parts bathed by helper: Right arm,Front perineal area,Buttocks,Right upper leg,Left upper leg,Right lower leg,Left lower leg     Bathing assist Assist Level: 2 Helpers     Upper Body Dressing/Undressing Upper body dressing   What is the patient wearing?: Pull over shirt    Upper body assist Assist Level: Total Assistance - Patient < 25%    Lower Body Dressing/Undressing Lower body dressing      What is the patient wearing?: Pants     Lower body assist Assist for lower body dressing: 2 Helpers     Toileting Toileting    Toileting assist Assist for toileting: 2 Helpers     Transfers Chair/bed transfer  Transfers assist  Chair/bed transfer activity did not occur: Safety/medical concerns  Chair/bed transfer assist level: Dependent - mechanical lift     Locomotion Ambulation   Ambulation assist   Ambulation activity did not occur: Safety/medical concerns          Walk 10  feet activity   Assist  Walk 10 feet activity did not occur: Safety/medical concerns        Walk 50 feet activity   Assist Walk 50 feet with 2 turns activity did not occur: Safety/medical concerns         Walk 150 feet activity   Assist Walk 150 feet activity did not occur: Safety/medical concerns         Walk 10 feet on uneven surface  activity   Assist Walk 10 feet on uneven surfaces activity did not occur: Safety/medical concerns         Wheelchair     Assist Will patient use wheelchair at discharge?: Yes (TBD but likely)             Wheelchair 50 feet with 2 turns activity    Assist    Wheelchair 50  feet with 2 turns activity did not occur: Safety/medical concerns       Wheelchair 150 feet activity     Assist  Wheelchair 150 feet activity did not occur: Safety/medical concerns       Blood pressure (!) 164/76, pulse 68, temperature 98 F (36.7 C), temperature source Oral, resp. rate 16, height 6' (1.829 m), weight 133.5 kg, SpO2 98 %.    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 Decadron taper -Patient may  Shower with cap -ELOS/Goals: 3-4 wks minA goals  -Continue CIR therapies including PT, OT, and SLP    -night splints at HS to stretch heel cords 2. Left posterior tibial vein DVT:  Vascular ultrasound positive for age indeterminate left posterior tibial vein.   -continue xarelto. -antiplatelet therapy: N/A 3. Post-operative pain:tylenol prn  -voltaren gel for right shoulder---will rx left shoulder as well   -work on appropriate techniques, ROM with therapy  -addedbaclofen 10mg  qhs for cramps and to assist with sleep  -discussed with wife that she can get blue emu oil for him 4. Mood/sleep:  improved  -improved with scheduled trazodone 50mg ---continue  - rx pain, on baclofen too -antipsychotic agents: N/A  -add melatonin 3mg  HS 5. Neuropsych: This patientiscapable of making decisions on hisown behalf. 6. Skin/Wound Care:rash resolved   -double sheeting bed   -turn in bed 7. Fluids/Electrolytes/Nutrition:eating well     8. Hypertension. Lasix 20 mg daily   -Restarted home Flomax HS which will also help with BP   -no norvasc d/t swelling   -consider b-blocker if needed   -4/30 fair control: continue current regimen. Provided a list of foods that can help to lower BP.   5/1: Cr and K+ stable. Increase lasix to 40mg  as SBP has been  quite elevated. Repeat BMP this week 9. Hyperlipidemia. Lipitor 10. BPH: increased Flomax to 0.8mg  HS 11. Urinary frequency: foley out. Continent/incontinent  -UA negative.  timed voiding q2H while awake with urinal- placed nursing order   -Flomax to 0.8mg  given continued symptoms and elevated BP 12. Leukocytosis. Likely d/t steroids. Afebrile. No s/s of infection   -continue to follow   -  decadron  off 13. Constipation:  -increased colace to senna-s bid  -magnesium citrate ordered 4/30  LOS: 16 days A FACE TO [place] 05/18/2020, 12:40 PM

## 2020-05-18 NOTE — Plan of Care (Signed)
  Problem: Consults Goal: RH BRAIN INJURY PATIENT EDUCATION Description: Description: See Patient Education module for eduction specifics Outcome: Progressing Goal: Skin Care Protocol Initiated - if Braden Score 18 or less Description: If consults are not indicated, leave blank or document N/A Outcome: Progressing   Problem: RH BOWEL ELIMINATION Goal: RH STG MANAGE BOWEL WITH ASSISTANCE Description: STG Manage Bowel with mod Assistance. Outcome: Progressing Goal: RH STG MANAGE BOWEL W/MEDICATION W/ASSISTANCE Description: STG Manage Bowel with Medication with mod Assistance. Outcome: Progressing   Problem: RH SKIN INTEGRITY Goal: RH STG MAINTAIN SKIN INTEGRITY WITH ASSISTANCE Description: STG Maintain Skin Integrity With mod Assistance. Outcome: Progressing Goal: RH STG ABLE TO PERFORM INCISION/WOUND CARE W/ASSISTANCE Description: STG Able To Perform Incision/Wound Care With mod Assistance. Outcome: Progressing   Problem: RH SAFETY Goal: RH STG ADHERE TO SAFETY PRECAUTIONS W/ASSISTANCE/DEVICE Description: STG Adhere to Safety Precautions With mod Assistance/Device. Outcome: Progressing   Problem: RH PAIN MANAGEMENT Goal: RH STG PAIN MANAGED AT OR BELOW PT'S PAIN GOAL Description: <3 on a 0-10 pain scale. Outcome: Progressing   Problem: RH KNOWLEDGE DEFICIT BRAIN INJURY Goal: RH STG INCREASE KNOWLEDGE OF SELF CARE AFTER BRAIN INJURY Description: Patient will be able to demonstrate knowledge of medication management, dietary management, skin/wound care, safety awareness with educational materials and handouts provided by staff, at discharge independently. Outcome: Progressing

## 2020-05-18 NOTE — Progress Notes (Signed)
Physical Therapy Session Note  Patient Details  Name: Anthony Downs MRN: [id number] Date of Birth: [birth year]  Today's Date: 05/18/2020 PT Individual Time: 1015-1045 PT Individual Time Calculation (min): 30 min   Short Term Goals: Week 2:  PT Short Term Goal 1 (Week 2): Pt will maintain dynamic sitting balance EOM with max assist for at least 3 minutes PT Short Term Goal 2 (Week 2): Pt will perform bed mobility with max A +1 consistently. PT Short Term Goal 3 (Week 2): Patient will tolerate dependent lower extremity weight bearing >5 min using LRAD. PT Short Term Goal 4 (Week 2): Patient will initiate power mobility.  Skilled Therapeutic Interventions/Progress Updates:     Patient in power w/c in main gym received from [place], [place] at beginning of session.   Switch hand controls on power w/c from R to Anthony side to allow patient to self propel power w/c. Required increased time due to unfamiliarity with new equipment. Educated patient and his daughter, who arrived ~10 min into the session, about power mobility, opportunities for improved sitting tolerance with independent pressure relief and independence with locomotion. Discussed plan for attaining dependent standing this week and focus on lower extremity muscle activation as able to continue to work toward functional mobility goals.   Patient propelled the power w/c back to the room, >100 feet with 3 90 deg turns and 1 180 deg turn and backing in by the bed with supervision following PT demonstration of controls and min cuing for steering.  Patient in power w/c with his daughter in the room at end of session with breaks locked, set belt secure, and all needs within reach.    Therapy Documentation Precautions:  Precautions Precautions: Fall,Other (comment) Precaution Comments: Strong BLE extensor tone, hx of R hand tremor Restrictions Weight Bearing Restrictions: No   Therapy/Group: Individual Therapy  Anthony Downs Anthony Downs PT,  DPT  05/18/2020, 4:54 PM

## 2020-05-19 MED ORDER — LORATADINE 10 MG PO TABS: 10.0000 mg | ORAL_TABLET | Freq: Every day | ORAL | Status: DC | PRN

## 2020-05-19 NOTE — Progress Notes (Signed)
Physical Therapy Session Note  Patient Details  Name: Anthony Downs MRN: [id number] Date of Birth: 1950/07/07  Today's Date: 05/19/2020 PT Individual Time: 1884-1660 PT Individual Time Calculation (min): 38 min   Short Term Goals: Week 1:  PT Short Term Goal 1 (Week 1): Pt will perform R/L rolling in bed with +2 mod assist using bed features PT Short Term Goal 1 - Progress (Week 1): Partly met (roll R with mod A +1, roll L with max A +2) PT Short Term Goal 2 (Week 1): Pt will tolerate sitting EOB for at least 15 minutes during therapy PT Short Term Goal 2 - Progress (Week 1): Progressing toward goal PT Short Term Goal 3 (Week 1): Pt will maintain dynamic sitting balance EOM with max assist for at least 3 minutes PT Short Term Goal 3 - Progress (Week 1): Progressing toward goal PT Short Term Goal 4 (Week 1): Pt will perform supine<>sitting with +2 max assist PT Short Term Goal 4 - Progress (Week 1): Met PT Short Term Goal 5 (Week 1): Pt will tolerate sitting EOB for at least 15 minutes during therapy Week 2:  PT Short Term Goal 1 (Week 2): Pt will maintain dynamic sitting balance EOM with max assist for at least 3 minutes PT Short Term Goal 2 (Week 2): Pt will perform bed mobility with max A +1 consistently. PT Short Term Goal 3 (Week 2): Patient will tolerate dependent lower extremity weight bearing >5 min using LRAD. PT Short Term Goal 4 (Week 2): Patient will initiate power mobility.  Skilled Therapeutic Interventions/Progress Updates:   Received pt supine in bed with wife present at bedside. Pt agreeable to PT and denied any pain during session but appeared lethargic. Per wife, pt has been communicating much less today vs the past few days and slept all morning. Upon entry, pt just called for nursing to change wet brief; therapist took over with care. Noted RUE clonus with mobility that resolved with deep pressure to biceps. Pt rolled L/R with max A and doffed dirty pants and brief  with +2 assist and performed peri-care dependently with +2 to maintain pt in sidelying position and placed new chuck pads on bed. Donned clean brief and pants with total A +2. Pt falling in/out of sleep while dressing requiring mod cues for arousal. Pt agreeable to get OOB into power WC with convincing. Rolled L and R and required total A +2 to place sling and transferred bed<>power WC via [place] with +2 assist. Pt required +2 assist to reposition hips in chair and to don clean shirt. Added knee supports for improved pelvic alignment. Concluded session with pt sitting in power WC, needs within reach, and seatbelt on. Wife and visitor present at bedside.   Therapy Documentation Precautions:  Precautions Precautions: Fall,Other (comment) Precaution Comments: Strong BLE extensor tone, hx of R hand tremor Restrictions Weight Bearing Restrictions: No  Therapy/Group: Individual Therapy Alfonse Alpers PT, DPT   05/19/2020, 7:23 AM

## 2020-05-19 NOTE — Progress Notes (Signed)
Occupational Therapy Session Note  Patient Details  Name: Anthony Downs MRN: [id number] Date of Birth: 1950/12/14  Today's Date: 05/19/2020 OT Individual Time: 6147-0929 OT Individual Time Calculation (min): 45 min    Short Term Goals: Week 3:  OT Short Term Goal 1 (Week 3): Pt will complete 2/4 steps of UB dressing OT Short Term Goal 2 (Week 3): pt will mange trunk from supine>sit with MOD A consistently OT Short Term Goal 3 (Week 3): Pt will use RUE as stabilizer with MIN A consistently  Skilled Therapeutic Interventions/Progress Updates:    Pt received supine with no c/o pain, eager to engage in ADLs. Pt completed oral care with min HOH to hold toothpaste/brush in his RUE. Washmitt was donned to pt's RUE and he required mod-max facilitation for [facility] forced use of the RUE to wash chest and under LUE. Pt required max facilitation to lift up the RUE to wash under. Clonus present in RUE. Good attention to R side of body this session. Pt still lethargic throughout session, often requiring cueing to open eyes and re-engage with activity. He rolled R with mod A and L with max A for peri hygiene, max A. Pt transferred to EOB with total A +2. He was able to maintain sitting balance with supervision. He donned shirt with mod A, requiring cueing for hemi technique. Gentle PROM provided to the RUE- increased tone present in the biceps. Pt guided through increasing visual attention to RUE and AROM. Pt returned to supine and was left with all needs met. Offered to position pt in sidelying and he declined. Bed alarm set.   Therapy Documentation Precautions:  Precautions Precautions: Fall,Other (comment) Precaution Comments: Strong BLE extensor tone, hx of R hand tremor Restrictions Weight Bearing Restrictions: No   Therapy/Group: Individual Therapy  Curtis Sites 05/19/2020, 6:22 AM

## 2020-05-19 NOTE — Progress Notes (Signed)
Speech Language Pathology Daily Session Note  Patient Details  Name: Anthony Downs MRN: [id number] Date of Birth: July 18, 1950  Today's Date: 05/19/2020 SLP Individual Time: 3762-8315 SLP Individual Time Calculation (min): 45 min  Short Term Goals: Week 3: SLP Short Term Goal 1 (Week 3): Pt will recall novel/functional information with use of memory compensaotry strategies with supervision verbal cues SLP Short Term Goal 2 (Week 3): Pt will complete mildly complex problem solving and executive function tasks with Mod I. SLP Short Term Goal 3 (Week 3): Pt will participate in verbal fluency tasks (divergent naming, etc) with supervision verbal cues SLP Short Term Goal 4 (Week 3): Pt will understand, recall and utilize strategies to reduce frustration and improve communication at simple conversation level provided supervision verbal cues SLP Short Term Goal 5 (Week 3): Pt will demosntrate selective attention to functional tasks in a mildly distracting enviornment for 45 mintues with supervision verbal cues for redirection.  Skilled Therapeutic Interventions: Skilled SLP intervention focused on cognition. Decreased attention and increased difficulty noted withword finding in conversation and with structured tasks this morning. Pt reported he did not get any sleep and felt tired. Divergent naming task with familiar categories completed with Min A verbal semantic cues to name greater than 8 items. Increased time and redirection required during this task. Short term memory recall task completed with short paragraphs completed with Min A verbal cues. Cont with therapy per plan of care.      Pain Pain Assessment Pain Scale: Faces Faces Pain Scale: No hurt  Therapy/Group: Individual Therapy  [name] Lielle Vandervort 05/19/2020, 9:25 AM

## 2020-05-19 NOTE — Progress Notes (Signed)
Physical Therapy Weekly Progress Note  Patient Details  Name: Anthony Downs MRN: [id number] Date of Birth: 22-May-1950  Beginning of progress report period: May 12, 2020 End of progress report period: May 20, 2020  Today's Date: 05/20/2020 PT Individual Time: 1134-1205 and 1300-1345 PT Individual Time Calculation (min): 31 min and 45 min   Patient has met 3 of 4 short term goals.  Patient with slow, but steady progress this week, limited by fatigue and slow lower extremity motor recovery at this time. Patient currently required max A for bed mobility, dependent transfers with lift, dependent standing tolerance for 5 min with notable gluteal activation, and supervision for cuing with power w/c mobility >100 ft.   Patient continues to demonstrate the following deficits muscle weakness and muscle joint tightness, decreased cardiorespiratoy endurance, abnormal tone, decreased coordination and decreased motor planning, decreased attention, decreased awareness and decreased problem solving and decreased sitting balance, decreased standing balance, decreased postural control, hemiplegia and decreased balance strategies and therefore will continue to benefit from skilled PT intervention to increase functional independence with mobility.  Patient not progressing toward long term goals.  See goal revision..  Plan of care revisions: d/c ambulation goals due to lack of progress, added power mobility goals.  PT Short Term Goals Week 2:  PT Short Term Goal 1 (Week 2): Pt will maintain dynamic sitting balance EOM with max assist for at least 3 minutes PT Short Term Goal 1 - Progress (Week 2): Met PT Short Term Goal 2 (Week 2): Pt will perform bed mobility with max A +1 consistently. PT Short Term Goal 2 - Progress (Week 2): Progressing toward goal PT Short Term Goal 3 (Week 2): Patient will tolerate dependent lower extremity weight bearing >5 min using LRAD. PT Short Term Goal 3 - Progress (Week 2):  Met PT Short Term Goal 4 (Week 2): Patient will initiate power mobility. PT Short Term Goal 4 - Progress (Week 2): Met Week 3:  PT Short Term Goal 1 (Week 3): Pt will perform bed mobility with max A +1 consistently. PT Short Term Goal 2 (Week 3): Patient will perform dynamic sitting balance at least 5 min with min A. PT Short Term Goal 3 (Week 3): Patient will perform pressure relief in power w/c indepenently. PT Short Term Goal 4 (Week 3): Patient will propel power w/c on outdoor/community surfaces >150 ft. PT Short Term Goal 5 (Week 3): Patient will perform dependent standing >8 min.  Skilled Therapeutic Interventions/Progress Updates:     Session 1: Patient in power w/c upon PT arrival. Patient alert and agreeable to PT session. Patient reported increased fatigue from sitting in the power w/c most of the morning, and requested to return to the bed. Also, reported that he had a bladder accident in attempts to use the urinal in sitting with his wife assisting and needed cleaned up.  Therapeutic Activity: Transfers: Performed dependent transfer power w/c>bed using Maxi-move. Placed and removed sling with total A. Performed lateral and forward leans with min A and total A for lifting legs to placed sling. Performed rolling with mod A to the R and max A +2 to the L for sling removal. Maintained R side-lying with supervision using bed rail during peri-care and lower body clothing management.   Patient's wife inquired about need for lift equipment at home at d/c. Discussed limitation with patient's progress at this time and potential for recommendations for a Hoyer lift with +2 assist for transfers and use of power mobility  if significant progress in functional mobility is not achieved. Discussed interventions planned for improved lower extremity motor control with dependent standing and use of estim to maximize potential for motor return. Patient's wife appreciative of discussion for setting  expectations. Patient had fallen asleep in the bed during discussion.  Patient in bed asleep with his wife at bedside at end of session with breaks locked, bed alarm set, and all needs within reach.   Session 2: Patient in bed with his wife at bedside upon PT arrival. Patient alert and agreeable to PT session. Patient denied pain during session, reported bladder incontinence at beginning of session. Stated his wife was not here when incontinence occurred. Educated on calling for nursing to assist with use of urinal. He reports that he waits until after due to delayed response and incontinence with urgency. Encouraged him to call when he has the need to go in hopes for reduced incontinence and at least reduced time with soiled brief. Patient in agreement.  Therapeutic Activity: Bed Mobility: Patient performed rolling and peri-care/lower body dressing, as above to change incontinence brief and don shorts. He performed supine to/from sit with mod-max A +2 for trunk and lower extremity management. Provided verbal cues for sequencing and use of L hand to assist on bed rail.  Neuromuscular Re-ed: Patient performed the following sitting balance and lower extremity motor control activities: -sat EOB >5 min with supervision with intermittent cues for midline orientation due to R lean, and performance of pulling forward with B upper extremities on Sara Plus 2x4, demonstrating good R hand grip and max A for R shoulder to come forward with limited R bicep activation -performed standing in [place] Plus x3 min and x5 min for improved standing tolerance, trunk control, and B gluteal and quad activation, patient able to activate glutes and bring hips forward >6 times with sustained contraction 3-4 sec with tactile and manual facilitation, patient able to straighten L knee throughout and R knee x3 with tactile and verbal cues, patient gave his wife a hug in standing, maintained trunk control while letting go with L hand to  hug her, patient and wife both tearful and happy about this experience   Patient in bed reporting that he felt very pleased with his progress today at end of session with breaks locked, bed alarm set, and all needs within reach.    Therapy Documentation Precautions:  Precautions Precautions: Fall,Other (comment) Precaution Comments: Strong BLE extensor tone, hx of R hand tremor Restrictions Weight Bearing Restrictions: No  Therapy/Group: Individual Therapy  Tehya Leath L Zeek Rostron PT, DPT  05/20/2020, 8:30 PM

## 2020-05-19 NOTE — Progress Notes (Signed)
Physical Therapy Session Note  Patient Details  Name: Anthony Downs MRN: [id number] Date of Birth: 1950-02-26  Today's Date: 05/19/2020 PT Individual Time: 5809-9833 PT Individual Time Calculation (min): 61 min   Short Term Goals: Week 2:  PT Short Term Goal 1 (Week 2): Pt will maintain dynamic sitting balance EOM with max assist for at least 3 minutes PT Short Term Goal 2 (Week 2): Pt will perform bed mobility with max A +1 consistently. PT Short Term Goal 3 (Week 2): Patient will tolerate dependent lower extremity weight bearing >5 min using LRAD. PT Short Term Goal 4 (Week 2): Patient will initiate power mobility.  Skilled Therapeutic Interventions/Progress Updates:     Patient in power w/c with family in the room upon PT arrival. Patient alert and agreeable to PT session, cracking jokes with family and therapist at beginning of session. Patient denied pain during session. Patient with increased fatigue as session progress intermittently falling asleep by the end of session.   Therapeutic Activity: Bed Mobility: Patient performed rolling R with mod A and L with max A to remove lift sling at end of session. Provided verbal cues for bending B lower extremities to improve rolling. Required total A to bed lower extremities without notable lower extremity muscle activation during mobility. Extensor tone set off on R x1 with knee extension. Transfers: Performed dependent lift transfers using Maxi-move w/c<>mat and w/c>bed. Patient performed trunk leans for pad placement with min A and lifting lower extremities with total A.  Wheelchair Mobility:  Patient propelled wheelchair >100 feet with 3 90 deg turns x2 with supervision. Provided verbal cues for steering technique and clearance on R side during turns.  Demonstrated use of tilt and recline features for improved sitting tolerance and independence with pressure relief. Patient able to perform tilt feature with min cues after. Educated on  performing pressure relief every 30 min in sitting for improved skin integrity and sitting tolerance. Patient and his wife stated understanding.   Neuromuscular Re-ed: Patient performed the following activities seated EOM focused on midline orientation and dynamic sitting balance >4 min: -reaching to visual target outside of BOS with L hand with min A-close supervision for sitting balance -lateral leans to L with min-mod A for return to midline x4 -forward lean shifting weight into B lower extremities and returning to midline x5  Patient in bed with B night splints donned with his wife's assistance at end of session with breaks locked, bed alarm set, and all needs within reach.    Therapy Documentation Precautions:  Precautions Precautions: Fall,Other (comment) Precaution Comments: Strong BLE extensor tone, hx of R hand tremor Restrictions Weight Bearing Restrictions: No   Therapy/Group: Individual Therapy  Jalin Erpelding L Kamill Fulbright PT, DPT  05/19/2020, 4:31 PM

## 2020-05-19 NOTE — Progress Notes (Signed)
PROGRESS NOTE   Subjective/Complaints: States he didn't sleep well over weekend. Having "dreams". Melatonin started. Admits to sleeping some during the day over weekend  ROS: Patient denies fever, rash, sore throat, blurred vision, nausea, vomiting, diarrhea, cough, shortness of breath or chest pain,  headache, or mood change.    Objective:   No results found. No results for input(s): WBC, HGB, HCT, PLT in the last 72 hours. No results for input(s): NA, K, CL, CO2, GLUCOSE, BUN, CREATININE, CALCIUM in the last 72 hours.  Intake/Output Summary (Last 24 hours) at 05/19/2020 1102 Last data filed at 05/19/2020 0819 Gross per 24 hour  Intake 380 ml  Output --  Net 380 ml        Physical Exam: Vital Signs Blood pressure (!) 152/73, pulse 81, temperature 98.2 F (36.8 C), resp. rate 20, height 6' (1.829 m), weight 133.5 kg, SpO2 97 %. Constitutional: No distress . Vital signs reviewed. HEENT: EOMI, oral membranes moist Neck: supple Cardiovascular: RRR without murmur. No JVD    Respiratory/Chest: CTA Bilaterally without wheezes or rales. Normal effort    GI/Abdomen: BS +, non-tender, non-distended Ext: no clubbing, cyanosis, or edema Psych: pleasant and cooperative Skin: scalp incision clean, dry.  Neurologic: delayed expression, movment, apraxic. Cranial nerves II through XII intact, motor strength is 5/5 in Left and 0/5 right deltoid, bicep, grip 1/5. tr/5 bilateral hip flexor, knee extensors, 0/5 ankle dorsiflexor and plantar flexor. Continued extensor tone. Reflexive movements only in LE's Musculoskeletal: right shoulder sl tender with IR/ER without significant sublux. Left shoulder tender in acromial area with IR/ER.      Assessment/Plan: 1. Functional deficits which require 3+ hours per day of interdisciplinary therapy in a comprehensive inpatient rehab setting.  Physiatrist is providing close team supervision and 24  hour management of active medical problems listed below.  Physiatrist and rehab team continue to assess barriers to discharge/monitor patient progress toward functional and medical goals  Care Tool:  Bathing    Body parts bathed by patient: Left arm,Chest,Abdomen,Face   Body parts bathed by helper: Right arm,Front perineal area,Buttocks,Right upper leg,Left upper leg,Right lower leg,Left lower leg     Bathing assist Assist Level: 2 Helpers     Upper Body Dressing/Undressing Upper body dressing   What is the patient wearing?: Pull over shirt    Upper body assist Assist Level: Total Assistance - Patient < 25%    Lower Body Dressing/Undressing Lower body dressing      What is the patient wearing?: Pants     Lower body assist Assist for lower body dressing: 2 Helpers     Toileting Toileting    Toileting assist Assist for toileting: 2 Helpers     Transfers Chair/bed transfer  Transfers assist  Chair/bed transfer activity did not occur: Safety/medical concerns  Chair/bed transfer assist level: Dependent - mechanical lift     Locomotion Ambulation   Ambulation assist   Ambulation activity did not occur: Safety/medical concerns          Walk 10 feet activity   Assist  Walk 10 feet activity did not occur: Safety/medical concerns        Walk 50 feet activity  Assist Walk 50 feet with 2 turns activity did not occur: Safety/medical concerns         Walk 150 feet activity   Assist Walk 150 feet activity did not occur: Safety/medical concerns         Walk 10 feet on uneven surface  activity   Assist Walk 10 feet on uneven surfaces activity did not occur: Safety/medical concerns         Wheelchair     Assist Will patient use wheelchair at discharge?: Yes (TBD but likely)             Wheelchair 50 feet with 2 turns activity    Assist    Wheelchair 50 feet with 2 turns activity did not occur: Safety/medical  concerns       Wheelchair 150 feet activity     Assist  Wheelchair 150 feet activity did not occur: Safety/medical concerns       Blood pressure (!) 152/73, pulse 81, temperature 98.2 F (36.8 C), resp. rate 20, height 6' (1.829 m), weight 133.5 kg, SpO2 97 %.    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 Decadron taper -Patient may  Shower with cap -ELOS/Goals: 3-4 wks minA goals  -Continue CIR therapies including PT, OT, and SLP    -night splints at HS to stretch heel cords 2. Left posterior tibial vein DVT:  Vascular ultrasound positive for age indeterminate left posterior tibial vein.   -continue xarelto. -antiplatelet therapy: N/A 3. Post-operative pain:tylenol prn  -voltaren gel for right shoulder---will rx left shoulder as well   -work on appropriate techniques, ROM with therapy  -addedbaclofen 10mg  qhs for cramps and to assist with sleep  -discussed with wife that she can get blue emu oil for him 4. Mood/sleep:  improved  -improved with scheduled trazodone 50mg ---continue  - rx pain, on baclofen too -antipsychotic agents: N/A  -added melatonin 3mg  HS without significant benefit  -encouraged him to avoid naps during the day 5. Neuropsych: This patientiscapable of making decisions on hisown behalf. 6. Skin/Wound Care:rash resolved   -double sheeting bed   -turn in bed 7. Fluids/Electrolytes/Nutrition:eating well     8. Hypertension. Lasix 20 mg daily   -Restarted home Flomax HS which will also help with BP   -no norvasc d/t swelling   -consider b-blocker if needed   -4/30 fair control: continue current regimen. Provided a list of foods that can help to lower BP.     5/2 lasix increased to 40mg  daily for elevated  bp's   -continue for now, check BMET tomorrow 9. Hyperlipidemia. Lipitor 10. BPH: increased Flomax to 0.8mg  HS 11. Urinary frequency: foley out. Continent/incontinent  -UA negative.  timed voiding q2H while awake with urinal- placed nursing order   -Flomax to 0.8mg  given continued symptoms and elevated BP 12. Leukocytosis. Likely d/t steroids. Afebrile. No s/s of infection   -continue to follow   -  decadron  off 13. Constipation:  -continue senna-s bid  -magnesium citrate ordered 4/30-->bm's 4/30-5/1  LOS: 17 days A FACE TO [place] 05/19/2020, 11:02 AM

## 2020-05-20 LAB — BASIC METABOLIC PANEL
Anion gap: 7 (ref 5–15)
BUN: 16 mg/dL (ref 8–23)
CO2: 30 mmol/L (ref 22–32)
Calcium: 9 mg/dL (ref 8.9–10.3)
Chloride: 100 mmol/L (ref 98–111)
Creatinine, Ser: 0.98 mg/dL (ref 0.61–1.24)
GFR, Estimated: >60 mL/min (ref >60)
Glucose, Bld: 131 mg/dL — ABNORMAL HIGH (ref 70–99)
Potassium: 3.7 mmol/L (ref 3.5–5.1)
Sodium: 137 mmol/L (ref 135–145)

## 2020-05-20 MED ORDER — FUROSEMIDE 20 MG PO TABS
20.0000 mg | ORAL_TABLET | Freq: Every day | ORAL | Status: DC
  Administered 2020-05-21 – 2020-06-17 (×28): 20 mg via ORAL
  Filled 2020-05-20 (×29): qty 1

## 2020-05-20 MED ORDER — METOPROLOL TARTRATE 12.5 MG HALF TABLET
12.5000 mg | ORAL_TABLET | Freq: Two times a day (BID) | ORAL | Status: DC
  Administered 2020-05-20 – 2020-06-12 (×46): 12.5 mg via ORAL
  Filled 2020-05-20 (×46): qty 1

## 2020-05-20 NOTE — Progress Notes (Signed)
PROGRESS NOTE   Subjective/Complaints: States he didn't sleep well over weekend. Having "dreams". Melatonin started. Admits to sleeping some during the day over weekend  ROS: Patient denies fever, rash, sore throat, blurred vision, nausea, vomiting, diarrhea, cough, shortness of breath or chest pain,  headache, or mood change.    Objective:   No results found. No results for input(s): WBC, HGB, HCT, PLT in the last 72 hours. Recent Labs    05/20/20 0541  NA 137  K 3.7  CL 100  CO2 30  GLUCOSE 131*  BUN 16  CREATININE 0.98  CALCIUM 9.0    Intake/Output Summary (Last 24 hours) at 05/20/2020 1203 Last data filed at 05/20/2020 0741 Gross per 24 hour  Intake 400 ml  Output --  Net 400 ml        Physical Exam: Vital Signs Blood pressure (!) 144/87, pulse 86, temperature 97.7 F (36.5 C), temperature source Oral, resp. rate 17, height 6' (1.829 m), weight 133.5 kg, SpO2 94 %. Constitutional: No distress . Vital signs reviewed. HEENT: EOMI, oral membranes moist Neck: supple Cardiovascular: RRR without murmur. No JVD    Respiratory/Chest: CTA Bilaterally without wheezes or rales. Normal effort    GI/Abdomen: BS +, non-tender, non-distended Ext: no clubbing, cyanosis, or edema Psych: pleasant and cooperative Skin: scalp incision clean, dry.  Neurologic: delayed expression, movment, apraxic. Cranial nerves II through XII intact, motor strength is 5/5 in Left and 0/5 right deltoid, bicep, grip 1/5. tr/5 bilateral hip flexor, knee extensors, 0/5 ankle dorsiflexor and plantar flexor. Continued extensor tone. Reflexive movements only in LE's Musculoskeletal: right shoulder sl tender with IR/ER without significant sublux. Left shoulder tender in acromial area with IR/ER.      Assessment/Plan: 1. Functional deficits which require 3+ hours per day of interdisciplinary therapy in a comprehensive inpatient rehab  setting.  Physiatrist is providing close team supervision and 24 hour management of active medical problems listed below.  Physiatrist and rehab team continue to assess barriers to discharge/monitor patient progress toward functional and medical goals  Care Tool:  Bathing    Body parts bathed by patient: Left arm,Chest,Abdomen,Face   Body parts bathed by helper: Right arm,Front perineal area,Buttocks,Right upper leg,Left upper leg,Right lower leg,Left lower leg     Bathing assist Assist Level: 2 Helpers     Upper Body Dressing/Undressing Upper body dressing   What is the patient wearing?: Pull over shirt    Upper body assist Assist Level: Total Assistance - Patient < 25%    Lower Body Dressing/Undressing Lower body dressing      What is the patient wearing?: Pants     Lower body assist Assist for lower body dressing: 2 Helpers     Toileting Toileting    Toileting assist Assist for toileting: 2 Helpers     Transfers Chair/bed transfer  Transfers assist  Chair/bed transfer activity did not occur: Safety/medical concerns  Chair/bed transfer assist level: Dependent - mechanical lift     Locomotion Ambulation   Ambulation assist   Ambulation activity did not occur: Safety/medical concerns          Walk 10 feet activity   Assist  Walk 10  feet activity did not occur: Safety/medical concerns        Walk 50 feet activity   Assist Walk 50 feet with 2 turns activity did not occur: Safety/medical concerns         Walk 150 feet activity   Assist Walk 150 feet activity did not occur: Safety/medical concerns         Walk 10 feet on uneven surface  activity   Assist Walk 10 feet on uneven surfaces activity did not occur: Safety/medical concerns         Wheelchair     Assist Will patient use wheelchair at discharge?: Yes (TBD but likely) Type of Wheelchair: Power    Wheelchair assist level: Supervision/Verbal cueing Max  wheelchair distance: >100 ft    Wheelchair 50 feet with 2 turns activity    Assist    Wheelchair 50 feet with 2 turns activity did not occur: Safety/medical concerns   Assist Level: Supervision/Verbal cueing   Wheelchair 150 feet activity     Assist  Wheelchair 150 feet activity did not occur: Safety/medical concerns       Blood pressure (!) 144/87, pulse 86, temperature 97.7 F (36.5 C), temperature source Oral, resp. rate 17, height 6' (1.829 m), weight 133.5 kg, SpO2 94 %.    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 Decadron taper -Patient may  Shower with cap -ELOS/Goals: 3-4 wks minA goals  --Interdisciplinary Team Conference today   2. Left posterior tibial vein DVT:  Vascular ultrasound positive for age indeterminate left posterior tibial vein.   -continue xarelto. -antiplatelet therapy: N/A 3. Post-operative pain:tylenol prn  -voltaren gel for right shoulder---will rx left shoulder as well   -work on appropriate techniques, ROM with therapy  -added baclofen 10mg  qhs for cramps and to assist with sleep   4. Mood/sleep:  improved  -improved with scheduled trazodone 50mg ---continue  - rx pain, on baclofen too -antipsychotic agents: N/A  -slept better last night---continue with trazodone and melatonin 5. Neuropsych: This patientiscapable of making decisions on hisown behalf. 6. Skin/Wound Care:rash resolved   -double sheeting bed   -turn in bed as needed 7. Fluids/Electrolytes/Nutrition:eating well     8. Hypertension. Lasix 20 mg daily   -Restarted home Flomax HS which will also help with BP   -no norvasc d/t swelling   5/2 lasix increased to 40mg  daily for elevated bp's   5/3 bpt's still elevated. BMET reviewed and  stable today but probably b-blocker a better choice- will begin metoprolol 12.5mg  bid 9. Hyperlipidemia. Lipitor 10. BPH: increased Flomax to 0.8mg  HS 11. Urinary frequency: foley out. Continent/incontinent  -UA negative.  timed voiding q2H while awake with urinal- placed nursing order   -Flomax to 0.8mg  given continued symptoms and elevated BP 12. Leukocytosis. Likely d/t steroids. Afebrile. No s/s of infection   -continue to follow   - decadron  off 13. Constipation:  -continue senna-s bid  -magnesium citrate ordered 4/30-->bm's 4/30-5/1, 5/3  LOS: 18 days A FACE TO [place] 05/20/2020, 12:03 PM

## 2020-05-20 NOTE — Progress Notes (Signed)
Speech Language Pathology Daily Session Note  Patient Details  Name: Anthony Downs MRN: [id number] Date of Birth: [birth year]  Today's Date: 05/20/2020 SLP Individual Time: 1350-1430 SLP Individual Time Calculation (min): 40 min  Short Term Goals: Week 3: SLP Short Term Goal 1 (Week 3): Pt will recall novel/functional information with use of memory compensaotry strategies with supervision verbal cues SLP Short Term Goal 2 (Week 3): Pt will complete mildly complex problem solving and executive function tasks with Mod I. SLP Short Term Goal 3 (Week 3): Pt will participate in verbal fluency tasks (divergent naming, etc) with supervision verbal cues SLP Short Term Goal 4 (Week 3): Pt will understand, recall and utilize strategies to reduce frustration and improve communication at simple conversation level provided supervision verbal cues SLP Short Term Goal 5 (Week 3): Pt will demosntrate selective attention to functional tasks in a mildly distracting enviornment for 45 mintues with supervision verbal cues for redirection.  Skilled Therapeutic Interventions: Pt seen for skilled ST with focus on cognitive goals. Wife present, pt and wife emotional having just finished successful PT session. Pt endorses "having a good day" and reports cognition very close to baseline during his "good days". Pt with 3 episodes word finding difficulties across tx session, benefiting from extra time to reduce frustration and produce target word. Pt participating in verbal fluency task (compare/contrast pictures and divergent naming) with min A verbal cues. Pt noted with increased verbal fluency and timeliness of speech production when discussing topics of interest. Pt left with wife, cont ST POC.   Pain Pain Assessment Pain Scale: 0-10 Pain Score: 0-No pain  Therapy/Group: Individual Therapy  Dewaine Conger 05/20/2020, 3:12 PM

## 2020-05-20 NOTE — Progress Notes (Signed)
Occupational Therapy Session Note  Patient Details  Name: Anthony Downs MRN: [id number] Date of Birth: 08/10/1950  Today's Date: 05/20/2020 OT Individual Time: 0932-3557 OT Individual Time Calculation (min): 65 min    Short Term Goals: Week 3:  OT Short Term Goal 1 (Week 3): Pt will complete 2/4 steps of UB dressing OT Short Term Goal 2 (Week 3): pt will mange trunk from supine>sit with MOD A consistently OT Short Term Goal 3 (Week 3): Pt will use RUE as stabilizer with MIN A consistently  Skilled Therapeutic Interventions/Progress Updates:    Pt received supine with no c/o pain. Pt rolled R with mod A with mod cueing for initiation, max A for rolling L. Donned shorts with total +2 assist. He transferred to EOB from sidelying with total +2 assist. Pt was able to maintain sitting balance with supervision overall, occasional CGA. He completed UB dressing with mod A, poor carryover of hemi technique. Pt was alert EOB and maintained balance while conversing with OT. He demonstrated tremor in the RUE while sitting. Pt completed transfer back to supine with max +2 assist. Hoyer pad was placed and pt was transferred via maximove to the power w/c. Pt required total +2 to scoot hips back into power w/c. Pt completed power w/c navigation in the hallway with mod cueing for alertness, as pt dozed off several times. Pt's RUE was ranged passively with focus on extension and ER. Pt was escorted back to his room, total A with power w/c mobility. Pt was left sitting up in the power w/c, reclined back for pressure relief. Wife present.   Therapy Documentation Precautions:  Precautions Precautions: Fall,Other (comment) Precaution Comments: Strong BLE extensor tone, hx of R hand tremor Restrictions Weight Bearing Restrictions: No  Therapy/Group: Individual Therapy  Curtis Sites 05/20/2020, 6:30 AM

## 2020-05-20 NOTE — Patient Care Conference (Signed)
Inpatient RehabilitationTeam Conference and Plan of Care Update Date: 05/20/2020   Time: 10:12 AM    Patient Name: Anthony Downs      Medical Record Number: [id number]  Date of Birth: March 01, 1950 Sex: Male         Room/Bed: 4W18C/4W18C-01 Payor Info: Payor: MEDICARE / Plan: MEDICARE PART A AND B / Product Type: *No Product type* /    Admit Date/Time:  05/02/2020  5:12 PM  Primary Diagnosis:  Brain tumor [facility])  Hospital Problems: Principal Problem:   Brain tumor [facility]) Active Problems:   Meningioma [facility])    Expected Discharge Date: Expected Discharge Date:  (SNF)  Team Members Present: Physician leading conference: Dr. Alger Simons Care Coodinator Present: Loralee Pacas, LCSWA;Emalyn Schou Creig Hines, RN, BSN, CRRN Nurse Present: Dorthula Nettles, RN PT Present: Apolinar Junes, PT OT Present: Laverle Hobby, OT SLP Present: Weston Anna, SLP PPS Coordinator present : Gunnar Fusi, SLP     Current Status/Progress Goal Weekly Team Focus  Bowel/Bladder   Pt incont of B/B. LBM 05/19/20  Pt gains normal function of Bowel and Bladder  [place] patient q2h   Swallow/Nutrition/ Hydration             ADL's   Improvement in RUE gross grasp activation, trace in shoulder and bicep/tricep. Supervision sitting balance, max +2 assist supine > sit, mod A UB ADLs, total A LB  Mod-max A overall  ADL retraining, d/c planning, RUE NMR, bed mobility   Mobility   Max A bed mobility of 1-2, min A-supervision sitting balance, dependent standing, continues to lack volitional motor control of B lower extremities and R upper extremity  Mod A overall, removed gait goals, added power mobility goals  Activity tolerance, NMR of extremities and trunk control, tone management, sitting tolerance, sitting balance, functional mobility, standing tolerance, patient/caregiver education   Communication   Supervision-Min A  Mod I  word-finding strategies, verbal fluency   Safety/Cognition/ Behavioral  Observations  Min A  Supervision  recall, attention and problem solving   Pain   Pt states he has no pain  Pt remains pain free  Assess for pain qshift and PRN and offer medication PRN   Skin   Crainotmy site is Clean and OTA. Rash on back is improving  Pt skin is crean dry and intact with no S/S of infection or breakdown  Skin care Qshift and PRN     Discharge Planning:  Pt to d/c to home with 24/7 care from wife.   Team Discussion: Slept better last night, bowels are moving. Incontinent B/B, 6-7/10 pain reported and prefers Tylenol. Has scheduled Trazodone for sleep. Incision to scalp is CDI.  Patient on target to meet rehab goals: Some improvement to RUE, presents like Clonus, has tremors at baseline. Supine to sit is a heavy max assist. Very slow go, max assist bed mobility, falls asleep during sessions. Dependent transfers with maxi-move. Extensor tone improving in LLE, working on pressure relief. SLP is limited by fatigue.  *See Care Plan and progress notes for long and short-term goals.   Revisions to Treatment Plan:  Not at this time.  Teaching Needs: Family education, medication management, pain management, skin/wound care, transfer training, power wheelchair training, balance training, endurance training, safety awareness.  Current Barriers to Discharge: Decreased caregiver support, Medical stability, Home enviroment access/layout, Incontinence, Wound care, Lack of/limited family support, Medication compliance and Behavior  Possible Resolutions to Barriers: Continue current medications, provide emotional support.     Medical Summary Current Status: slow  neurological progress. extensor tone LE's. sleep better with trazodone and melatonin. pain under control.  Barriers to Discharge: Medical stability   Possible Resolutions to [employer] Focus: ongoing assessment of labs/pt data. sleep-wake cycle mgt   Continued Need for Acute Rehabilitation Level of Care: The patient  requires daily medical management by a physician with specialized training in physical medicine and rehabilitation for the following reasons: Direction of a multidisciplinary physical rehabilitation program to maximize functional independence : Yes Medical management of patient stability for increased activity during participation in an intensive rehabilitation regime.: Yes Analysis of laboratory values and/or radiology reports with any subsequent need for medication adjustment and/or medical intervention. : Yes   I attest that I was present, lead the team conference, and concur with the assessment and plan of the team.   Cristi Loron 05/20/2020, 2:24 PM

## 2020-05-20 NOTE — Progress Notes (Signed)
Patient ID: Anthony Downs, male   DOB: Jan 21, 1950, 70 y.o.   MRN: [id number]  SW met with pt and pt wife in room to provide udpates from team conference. Wife is still unsure on if she is able to manage his care needs yet. Planned for family education on Wednesday (5/11) 10am-3pm. Wife did ask questions related to SNF placement if needed. SW explained SNF placement process.   Loralee Pacas, MSW, [place] Office: [phone] Cell: [phone] Fax: [phone]

## 2020-05-21 NOTE — Progress Notes (Signed)
Physical Therapy Session Note  Patient Details  Name: Anthony Downs MRN: [id number] Date of Birth: 12-29-1950  Today's Date: 05/21/2020 PT Treatment Time: 1300-1400 PT Treatment Time Calculation (min): 60  Short Term Goals: Week 3:  PT Short Term Goal 1 (Week 3): Pt will perform bed mobility with max A +1 consistently. PT Short Term Goal 2 (Week 3): Patient will perform dynamic sitting balance at least 5 min with min A. PT Short Term Goal 3 (Week 3): Patient will perform pressure relief in power w/c indepenently. PT Short Term Goal 4 (Week 3): Patient will propel power w/c on outdoor/community surfaces >150 ft. PT Short Term Goal 5 (Week 3): Patient will perform dependent standing >8 min.  Skilled Therapeutic Interventions/Progress Updates:     Patient in power w/c reporting increased fatigue and decreased sitting tolerance at this time due to being up in the chair since OT session early this morning upon PT arrival. Patient alert and agreeable PT session. Patient denied pain during session.   Therapeutic Activity: Bed Mobility: Patient performed sit to supine with mod A for trunk support and total A for lower extremity management with +2 assist. Provided verbal cues for lowering to his L elbow to side-lying for improved trunk control and bringing knees to chest to bring his legs on the bed and reduce extensor tone activation. Transfers: Patient performed dependent transfers with the [name] power w/c<>mat table and power w/c>bed with total A for lower extremity placement and hip adduction to fit in shin pads and total A for R upper extremity placement, patient able to grip R hand grip with his hand once arm placed. Provided verbal cues for hip extension and shoulder depression.  Wheelchair Mobility:  Patient propelled wheelchair >100 feet performing 90 deg and 180 deg turns and "parallel parking" nest to mat table with supervision. Provided verbal cues for reverse steering technique  and increasing speed of propulsion in open spaces. Reviewed power chair tilt controls with patient, reports that he performed this in sitting throughout the morning.   Neuromuscular Re-ed: Patient performed the following motor control activities for R upper extremity, B lower extremities, and trunk control: -pulling forward on knee block on power w/c with L hand with symmetrical shoulders focused on R trunk forward weight shift x2, added R hand with patient reaching forward and pulling his arm back with trace muscle activation with hand-over-hand assist, patient able to grip bar with R hand to assist with pulling his trunk forward x2 -sitting balance EOM with B upper extremities on Sara Plus pulling trunk forward, as above x3, focused on R trunk elongation and symmetrical shoulders -standing in [place] Plus focused on gluteal and quad activation for hip/knee extention, patient with increased R lean and R posteriorly rotated pelvis due to increased fatigue today, facilitated trunk elongation and hip rotation with muscle activation 2x2-3 min -sitting balance EOM focused on R trunk elongation and midline orientation with visual target on L x3 min with min A for sitting balance  Patient in bed with B DF adjustable night splints donned and positioned with hips in neutral due to natural abduction bias at the hips with his wife in the room at end of session with breaks locked, bed alarm set, and all needs within reach.   Therapy Documentation Precautions:  Precautions Precautions: Fall,Other (comment) Precaution Comments: Strong BLE extensor tone, hx of R hand tremor Restrictions Weight Bearing Restrictions: No   Therapy/Group: Individual Therapy  Sharmain Lastra L Evora Schechter PT, DPT  05/21/2020,  6:03 PM

## 2020-05-21 NOTE — Progress Notes (Signed)
PROGRESS NOTE   Subjective/Complaints: Had a better night. Says "best nigh of sleep". Stood for 5 minutes with PT yesterday and very happy about that  ROS: Patient denies fever, rash, sore throat, blurred vision, nausea, vomiting, diarrhea, cough, shortness of breath or chest pain,  headache, or mood change.    Objective:   No results found. No results for input(s): WBC, HGB, HCT, PLT in the last 72 hours. Recent Labs    05/20/20 0541  NA 137  K 3.7  CL 100  CO2 30  GLUCOSE 131*  BUN 16  CREATININE 0.98  CALCIUM 9.0    Intake/Output Summary (Last 24 hours) at 05/21/2020 0851 Last data filed at 05/21/2020 3557 Gross per 24 hour  Intake 440 ml  Output 400 ml  Net 40 ml        Physical Exam: Vital Signs Blood pressure 123/73, pulse 62, temperature 98.3 F (36.8 C), temperature source Oral, resp. rate 18, height 6' (1.829 m), weight 133.5 kg, SpO2 95 %. Constitutional: No distress . Vital signs reviewed. HEENT: EOMI, oral membranes moist Neck: supple Cardiovascular: RRR without murmur. No JVD    Respiratory/Chest: CTA Bilaterally without wheezes or rales. Normal effort    GI/Abdomen: BS +, non-tender, non-distended Ext: no clubbing, cyanosis, or edema Psych: pleasant and cooperative, flat Skin: scalp incision clean, dry.  Neurologic: delayed expression, movment, apraxic. Cranial nerves II through XII intact, motor strength is 5/5 in Left and 0/5 right deltoid, bicep, grip 1/5. Trace at best/5 bilateral hip flexor, knee extensors, 0/5 ankle dorsiflexor and plantar flexor. Continued extensor tone. Reflexive movements only in LE's Musculoskeletal: bilateral shoulder discomfort with ROM      Assessment/Plan: 1. Functional deficits which require 3+ hours per day of interdisciplinary therapy in a comprehensive inpatient rehab setting.  Physiatrist is providing close team supervision and 24 hour management of active  medical problems listed below.  Physiatrist and rehab team continue to assess barriers to discharge/monitor patient progress toward functional and medical goals  Care Tool:  Bathing    Body parts bathed by patient: Left arm,Chest,Abdomen,Face   Body parts bathed by helper: Right arm,Front perineal area,Buttocks,Right upper leg,Left upper leg,Right lower leg,Left lower leg     Bathing assist Assist Level: 2 Helpers     Upper Body Dressing/Undressing Upper body dressing   What is the patient wearing?: Pull over shirt    Upper body assist Assist Level: Total Assistance - Patient < 25%    Lower Body Dressing/Undressing Lower body dressing      What is the patient wearing?: Pants     Lower body assist Assist for lower body dressing: 2 Helpers     Toileting Toileting    Toileting assist Assist for toileting: 2 Helpers     Transfers Chair/bed transfer  Transfers assist  Chair/bed transfer activity did not occur: Safety/medical concerns  Chair/bed transfer assist level: Dependent - mechanical lift     Locomotion Ambulation   Ambulation assist   Ambulation activity did not occur: Safety/medical concerns          Walk 10 feet activity   Assist  Walk 10 feet activity did not occur: Safety/medical concerns  Walk 50 feet activity   Assist Walk 50 feet with 2 turns activity did not occur: Safety/medical concerns         Walk 150 feet activity   Assist Walk 150 feet activity did not occur: Safety/medical concerns         Walk 10 feet on uneven surface  activity   Assist Walk 10 feet on uneven surfaces activity did not occur: Safety/medical concerns         Wheelchair     Assist Will patient use wheelchair at discharge?: Yes (TBD but likely) Type of Wheelchair: Power    Wheelchair assist level: Supervision/Verbal cueing Max wheelchair distance: >100 ft    Wheelchair 50 feet with 2 turns activity    Assist     Wheelchair 50 feet with 2 turns activity did not occur: Safety/medical concerns   Assist Level: Supervision/Verbal cueing   Wheelchair 150 feet activity     Assist  Wheelchair 150 feet activity did not occur: Safety/medical concerns       Blood pressure 123/73, pulse 62, temperature 98.3 F (36.8 C), temperature source Oral, resp. rate 18, height 6' (1.829 m), weight 133.5 kg, SpO2 95 %.    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 Decadron taper -Patient may  Shower with cap -ELOS/Goals: 3-4 wks minA goals  -Continue CIR therapies including PT, OT SLP. Discussing goals of care with wife/pt.   2. Left posterior tibial vein DVT:  Vascular ultrasound positive for age indeterminate left posterior tibial vein.   -continue xarelto. -antiplatelet therapy: N/A 3. Post-operative pain:tylenol prn  -voltaren gel for right shoulder---will rx left shoulder as well   -work on appropriate techniques, ROM with therapy  -added baclofen 10mg  qhs for cramps and to assist with sleep   4. Mood/sleep:  improved  -improved with scheduled trazodone 50mg ---continue  - rx pain, on baclofen too -antipsychotic agents: N/A  -generally improving-continue with trazodone and melatonin   -minimize day time napping 5. Neuropsych: This patientiscapable of making decisions on hisown behalf. 6. Skin/Wound Care:rash resolved   -double sheeting bed   -turn in bed as needed 7. Fluids/Electrolytes/Nutrition:eating well     8. Hypertension. Lasix 20 mg daily   -Restarted home Flomax HS which will also help with BP   -no norvasc d/t swelling   5/2 lasix increased to 40mg  daily for elevated bp's   5/4 bp improved with metoprolol. Continue    -lasix at 20mg  daily 9.  Hyperlipidemia. Lipitor 10. BPH: increased Flomax to 0.8mg  HS 11. Urinary frequency: foley out. Continent/incontinent  -UA negative.  timed voiding q2H while awake with urinal- placed nursing order   -Flomax to 0.8mg  given continued symptoms and elevated BP 12. Leukocytosis. Likely d/t steroids. Afebrile. No s/s of infection   -continue to follow   - decadron  off 13. Constipation:  -continue senna-s bid  -magnesium citrate ordered 4/30-->bm's 4/30-5/1, 5/3  LOS: 19 days A FACE TO [place] 05/21/2020, 8:51 AM

## 2020-05-21 NOTE — Progress Notes (Signed)
Speech Language Pathology Daily Session Note  Patient Details  Name: Anthony Downs MRN: [id number] Date of Birth: Mar 04, 1950  Today's Date: 05/21/2020 SLP Individual Time: 1045-1130 SLP Individual Time Calculation (min): 45 min  Missed time: 15 minutes d/t pt fatigue  Short Term Goals: Week 3: SLP Short Term Goal 1 (Week 3): Pt will recall novel/functional information with use of memory compensaotry strategies with supervision verbal cues SLP Short Term Goal 2 (Week 3): Pt will complete mildly complex problem solving and executive function tasks with Mod I. SLP Short Term Goal 3 (Week 3): Pt will participate in verbal fluency tasks (divergent naming, etc) with supervision verbal cues SLP Short Term Goal 4 (Week 3): Pt will understand, recall and utilize strategies to reduce frustration and improve communication at simple conversation level provided supervision verbal cues SLP Short Term Goal 5 (Week 3): Pt will demosntrate selective attention to functional tasks in a mildly distracting enviornment for 45 mintues with supervision verbal cues for redirection.  Skilled Therapeutic Interventions: Pt seen for skilled ST with focus on cognitive goals. Pt did miss initial 15 minutes of therapy d/t fatigue, having just finished therapy session with standing and transfer to wheelchair. SLP facilitating mild-moderately complex verbal fluency task by provided consistent min A verbal cues. Pt demonstrating increased awareness of errors during tasks with Supverivison cues to self-correct and/or compensate. Mildly complex problem solving task for abstract concepts with ~80% accuracy independent, min cues increased to 100%. Pt left in wheelchair with wife present and all needs met. Cont ST POC.   Pain Pain Assessment Pain Scale: 0-10 Pain Score: 0-No pain  Therapy/Group: Individual Therapy  Dewaine Conger 05/21/2020, 11:44 AM

## 2020-05-21 NOTE — Progress Notes (Signed)
Occupational Therapy Session Note  Patient Details  Name: Anthony Downs MRN: [id number] Date of Birth: [birth year]  Today's Date: 05/21/2020 OT Individual Time: 6295-2841 OT Individual Time Calculation (min): 75 min    Short Term Goals: Week 3:  OT Short Term Goal 1 (Week 3): Pt will complete 2/4 steps of UB dressing OT Short Term Goal 2 (Week 3): pt will mange trunk from supine>sit with MOD A consistently OT Short Term Goal 3 (Week 3): Pt will use RUE as stabilizer with MIN A consistently  Skilled Therapeutic Interventions/Progress Updates:    Pt supine, ready for ADLs! Pt's wife present during session as well. Pt completed UB bathing with wash mitt donned to his LUE and HOH provided for forced use. Max facilitation required during cross body reaching and all shoulder/elbow activation. Pt was able to activate wrist and finger flexion volitionally with mod cueing for visual attention to limb. Mod A for UB bathing overall. He rolled R with mod A and L with max A. Pt completed peri hygiene in sidelying with max A to maintain position while reaching around. Pt transferred to EOB with total +2 assist. He was able to maintain sitting balance with CGA to supervision for several minutes. He did require mod A to correct R lean once he initiated UB dressing EOB. He donned shirt with mod A. Pt completed sit > stand in the sara plus. Once in standing pt was able to activate his posterior chain and bring hips into more neutral pelvic tilt. Pt was able to maintain stand for several minutes with RUE in weightbearing and with R grasp. Pt completed 1x more sit > stand before sara was used to transfer him to the power w/c. Pt quickly fatigued and was left sitting up in power w/c with the seat reclined back for pressure relief. Edu pt's wife on pressure relief in power w/c.   [place] Stim One was applied to pt's R forearm to activate finger and wrist flexors for increased muscle activation. Skin intact before and  after application with no c/o pain. 60 min unattended.  330 pulse width 35 Hz pulse rate On 8 sec/ off 8 sec Ramp up/ down 2 sec Symmetrical Biphasic wave form  Max intensity 166mA at 500 Ohm load  Therapy Documentation Precautions:  Precautions Precautions: Fall,Other (comment) Precaution Comments: Strong BLE extensor tone, hx of R hand tremor Restrictions Weight Bearing Restrictions: No   Therapy/Group: Individual Therapy  Curtis Sites 05/21/2020, 6:29 AM

## 2020-05-22 NOTE — Progress Notes (Signed)
Recreational Therapy Session Note  Patient Details  Name: CALEN GEISTER MRN: [id number] Date of Birth: December 23, 1950 Today's Date: 05/22/2020  Pain: no c/o Skilled Therapeutic Interventions/Progress Updates: Session focused on bed mobility and dynamic sitting balance EOB during co-treat with PT.  Upon entry, bed in bed stating that he was wet.  While gathering supplies, pt stated he was also having a BM.  Pt performed bed mobility with +2 assist for changing brief and clothing.  Pt agreeable to sitting EOB for reaching activity for remainder of the session reaching outside BOS with LUE with min-mod assist for balance.  Pt closing eyes seated EOB and with c/o fatigue.  Assisted pt back to bed with +2 assist, brother at bedside.   Therapy/Group: Co-Treatment  Jahlia Omura 05/22/2020, 4:01 PM

## 2020-05-22 NOTE — Progress Notes (Signed)
Speech Language Pathology Discharge Summary  Patient Details  Name: Anthony Downs MRN: [id number] Date of Birth: 1950/04/03  Today's Date: 05/22/2020 SLP Individual Time: 0900-0940 SLP Individual Time Calculation (min): 40 min   Skilled Therapeutic Interventions: Skilled treatment session focused on communication goals. SLP facilitated session by providing extra time and overall Min verbal cues for arousal throughout session. Despite fatigue, patient completed responsive, divergent, convergent and confrontational naming tasks with overall Mod I. Provided re-education to the patient and his wife regarding word-finding strategies and how to maximize overall accuracy and fluency with verbal expression. Both verbalize understanding. Patient left upright in bed with alarm on and all needs within reach.   Patient has met 5 of 5 long term goals.  Patient to discharge at overall Supervision level.   Reasons goals not met: N/A   Clinical Impression/Discharge Summary: Patient has made functional gains and has met 5 of 5 LTGs this admission. Currently, patient requires overall supervision level verbal cues for complex problem solving and recall during functional tasks which will be provided by his wife upon discharge. Patient also requires overall extra time for verbal expression and word-finding which can fluctuate at times due to fatigue. Patient and family education is complete and both the patient and his wife agree with recommendations to discharge from skilled SLP intervention and request more time with PT/OT with focus on mobility. F/u SLP services are not warranted at this time.    Care Partner: Yes, cognition   Recommendation:  None      Equipment: N/A   Reasons for discharge: Treatment goals met   Patient/Family Agrees with Progress Made and Goals Achieved: Yes    [place], [place] 05/22/2020, 1:30 PM

## 2020-05-22 NOTE — Progress Notes (Signed)
Occupational Therapy Session Note  Patient Details  Name: RIO KIDANE MRN: [id number] Date of Birth: 1950/04/06  Today's Date: 05/22/2020 OT Individual Time: 0700-0759 OT Individual Time Calculation (min): 59 min    Short Term Goals: Week 1:  OT Short Term Goal 1 (Week 1): Pt will roll with MAX A of 1 caregiver to decrease BOC for toileitng OT Short Term Goal 1 - Progress (Week 1): Met OT Short Term Goal 2 (Week 1): Pt will tolerate laying sidelying on R 1x per day to assist with tone management OT Short Term Goal 2 - Progress (Week 1): Met OT Short Term Goal 3 (Week 1): Pt will recall hemi dressing techniques with MOD VC OT Short Term Goal 3 - Progress (Week 1): Progressing toward goal OT Short Term Goal 4 (Week 1): Pt will sit EOB 10 min with mod A in prep for ADL OT Short Term Goal 4 - Progress (Week 1): Met  Skilled Therapeutic Interventions/Progress Updates:    1:1 Pt received in bed awake eating breakfast ready for tx with no pain.  ADL: Pt requires total A for donning teds/pants at bed level, however pt able to roll with +1 A to advance pants pasthips with improved trunk activaiton to assist with rolling using bed rials. Pt completes Sup>sit 2x throughout session with +2 A and VC for hand positioning to improve push up to sitting. Pt completes oral care sitting with CGA intermittently for sitting balance at EOB. Pt dons shirt with MOD A overall sitting EOB with MIN A for balance. Pt unable to recall hemi dressing techniques requiring MAX VC.  There act: Pt sit to stand in sara+ 3x with mini squats for glute activation/strengthening in prep for standing with VC for upright posture and terminal hip extension. Pt requesting to toilet and no appropriate BSC around d/t weight therefore pt placed on bed pan. Bariatric BSC acquired to trial in next session. Exited session with pt seated in bed, exit alarm on and call light in reach   Therapy Documentation Precautions:   Precautions Precautions: Fall,Other (comment) Precaution Comments: Strong BLE extensor tone, hx of R hand tremor Restrictions Weight Bearing Restrictions: No General:   Vital Signs: Therapy Vitals Temp: 98.3 F (36.8 C) Temp Source: Oral Pulse Rate: 73 Resp: 16 BP: 130/69 Patient Position (if appropriate): Lying Oxygen Therapy SpO2: 98 % O2 Device: Room Air Pain:   ADL: ADL Grooming: Moderate assistance (per pt report) Where Assessed-Grooming: Bed level Upper Body Bathing: Moderate assistance Where Assessed-Upper Body Bathing: Bed level Lower Body Bathing: Dependent (+2 rolling) Where Assessed-Lower Body Bathing: Bed level Upper Body Dressing: Dependent (+2 for rolling to pull down back) Where Assessed-Upper Body Dressing: Bed level Lower Body Dressing: Dependent Where Assessed-Lower Body Dressing: Bed level Toileting: Unable to assess Vision   Perception    Praxis   Exercises:   Other Treatments:     Therapy/Group: Individual Therapy  [name] 05/22/2020, 6:40 AM

## 2020-05-22 NOTE — Progress Notes (Signed)
PROGRESS NOTE   Subjective/Complaints: Movement in left hip, knee this morning. Able to draw leg up in bed, had it held in a bent position when I entered  ROS: Limited due to cognitive/behavioral     Objective:   No results found. No results for input(s): WBC, HGB, HCT, PLT in the last 72 hours. Recent Labs    05/20/20 0541  NA 137  K 3.7  CL 100  CO2 30  GLUCOSE 131*  BUN 16  CREATININE 0.98  CALCIUM 9.0    Intake/Output Summary (Last 24 hours) at 05/22/2020 1108 Last data filed at 05/22/2020 0735 Gross per 24 hour  Intake 240 ml  Output 400 ml  Net -160 ml        Physical Exam: Vital Signs Blood pressure 130/69, pulse 73, temperature 98.3 F (36.8 C), temperature source Oral, resp. rate 16, height 6' (1.829 m), weight 133.5 kg, SpO2 98 %. Constitutional: No distress . Vital signs reviewed. HEENT: EOMI, oral membranes moist Neck: supple Cardiovascular: RRR without murmur. No JVD    Respiratory/Chest: CTA Bilaterally without wheezes or rales. Normal effort    GI/Abdomen: BS +, non-tender, non-distended Ext: no clubbing, cyanosis, or edema Psych: pleasant and cooperative Skin: scalp incision clean, dry.  Neurologic: apraxic movments, speech. Tremor in RUE. Cranial nerves II through XII intact, motor strength is 5/5 in Left and 0/5 right deltoid, bicep, grip 1/5. Trace at best/5 right hip flexor, knee extensors, 1+ left hip flexor, KE?. 0/5 ankle dorsiflexor and plantar flexor bilaterally. Continued extensor tone. Musculoskeletal: bilateral shoulder discomfort with ROM      Assessment/Plan: 1. Functional deficits which require 3+ hours per day of interdisciplinary therapy in a comprehensive inpatient rehab setting.  Physiatrist is providing close team supervision and 24 hour management of active medical problems listed below.  Physiatrist and rehab team continue to assess barriers to discharge/monitor patient  progress toward functional and medical goals  Care Tool:  Bathing    Body parts bathed by patient: Left arm,Chest,Abdomen,Face   Body parts bathed by helper: Right arm,Front perineal area,Buttocks,Right upper leg,Left upper leg,Right lower leg,Left lower leg     Bathing assist Assist Level: 2 Helpers     Upper Body Dressing/Undressing Upper body dressing   What is the patient wearing?: Pull over shirt    Upper body assist Assist Level: Total Assistance - Patient < 25%    Lower Body Dressing/Undressing Lower body dressing      What is the patient wearing?: Pants     Lower body assist Assist for lower body dressing: 2 Helpers     Toileting Toileting    Toileting assist Assist for toileting: 2 Helpers     Transfers Chair/bed transfer  Transfers assist  Chair/bed transfer activity did not occur: Safety/medical concerns  Chair/bed transfer assist level: Dependent - mechanical lift     Locomotion Ambulation   Ambulation assist   Ambulation activity did not occur: Safety/medical concerns          Walk 10 feet activity   Assist  Walk 10 feet activity did not occur: Safety/medical concerns        Walk 50 feet activity   Assist  Walk 50 feet with 2 turns activity did not occur: Safety/medical concerns         Walk 150 feet activity   Assist Walk 150 feet activity did not occur: Safety/medical concerns         Walk 10 feet on uneven surface  activity   Assist Walk 10 feet on uneven surfaces activity did not occur: Safety/medical concerns         Wheelchair     Assist Will patient use wheelchair at discharge?: Yes (TBD but likely) Type of Wheelchair: Power    Wheelchair assist level: Supervision/Verbal cueing Max wheelchair distance: >100 ft    Wheelchair 50 feet with 2 turns activity    Assist    Wheelchair 50 feet with 2 turns activity did not occur: Safety/medical concerns   Assist Level: Supervision/Verbal  cueing   Wheelchair 150 feet activity     Assist  Wheelchair 150 feet activity did not occur: Safety/medical concerns       Blood pressure 130/69, pulse 73, temperature 98.3 F (36.8 C), temperature source Oral, resp. rate 16, height 6' (1.829 m), weight 133.5 kg, SpO2 98 %.    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 Decadron taper -Patient may  Shower with cap -ELOS/Goals: 3-4 wks minA goals  -Continue CIR therapies including PT, OT SLP. Discussed goals of care with wife/pt.    -encouraging to see some movement in LLE. Hopefully it will translate into functional use. 2. Left posterior tibial vein DVT:  Vascular ultrasound positive for age indeterminate left posterior tibial vein.   -continue xarelto. -antiplatelet therapy: N/A 3. Post-operative pain:tylenol prn  -voltaren gel for right shoulder---will rx left shoulder as well   -work on appropriate techniques, ROM with therapy  -added baclofen 10mg  qhs for cramps and to assist with sleep   4. Mood/sleep:  improved  -improved with scheduled trazodone 50mg ---continue  - rx pain, on baclofen too -antipsychotic agents: N/A  -generally improving-continue with trazodone and melatonin   -minimize day time napping 5. Neuropsych: This patientiscapable of making decisions on hisown behalf. 6. Skin/Wound Care:rash resolved   -double sheeting bed   -turn in bed as needed 7. Fluids/Electrolytes/Nutrition:eating well     8. Hypertension. Lasix 20 mg daily   -Restarted home Flomax HS which will also help with BP   -no norvasc d/t swelling   5/2 lasix increased to 40mg  daily for elevated bp's   5/5 bp improved.     -continue metoprolol    -lasix at 20mg  daily 9. Hyperlipidemia. Lipitor 10. BPH:  increased Flomax to 0.8mg  HS 11. Urinary frequency: foley out. Continent/incontinent  -UA negative.  timed voiding q2H while awake with urinal- placed nursing order   -Flomax to 0.8mg  given continued symptoms and elevated BP 12. Leukocytosis. Likely d/t steroids. Afebrile. No s/s of infection   -continue to follow   - decadron  off 13. Constipation:  -continue senna-s bid  -magnesium citrate ordered 4/30-->bm's 4/30-5/1, 5/3  LOS: 20 days A FACE TO [place] 05/22/2020, 11:08 AM

## 2020-05-22 NOTE — Progress Notes (Signed)
Physical Therapy Session Note  Patient Details  Name: Anthony Downs MRN: [id number] Date of Birth: 05-Jun-1950  Today's Date: 05/22/2020 PT Individual Time: 6468-0321 and 1300-1345  PT Individual Time Calculation (min): 40 min and 45 min  Short Term Goals: Week 2:  PT Short Term Goal 1 (Week 2): Pt will maintain dynamic sitting balance EOM with max assist for at least 3 minutes PT Short Term Goal 1 - Progress (Week 2): Met PT Short Term Goal 2 (Week 2): Pt will perform bed mobility with max A +1 consistently. PT Short Term Goal 2 - Progress (Week 2): Progressing toward goal PT Short Term Goal 3 (Week 2): Patient will tolerate dependent lower extremity weight bearing >5 min using LRAD. PT Short Term Goal 3 - Progress (Week 2): Met PT Short Term Goal 4 (Week 2): Patient will initiate power mobility. PT Short Term Goal 4 - Progress (Week 2): Met Week 3:  PT Short Term Goal 1 (Week 3): Pt will perform bed mobility with max A +1 consistently. PT Short Term Goal 2 (Week 3): Patient will perform dynamic sitting balance at least 5 min with min A. PT Short Term Goal 3 (Week 3): Patient will perform pressure relief in power w/c indepenently. PT Short Term Goal 4 (Week 3): Patient will propel power w/c on outdoor/community surfaces >150 ft. PT Short Term Goal 5 (Week 3): Patient will perform dependent standing >8 min.  Skilled Therapeutic Interventions/Progress Updates:   Treatment Session 1 Received pt supine in bed, pt and wife excited that pt is now able to bend L knee and straighten it out (was too fatigued to perform for this therapist). Pt agreeable to PT treatment and denied any pain but reported being wet and requesting brief change. Session with emphasis on dressing, toileting, functional mobility/transfers, generalized strengthening, dynamic sitting and standing balance/coordination, and improved activity tolerance. Pt rolled L/R with max/total A +2 and required total A +2 to doff soiled  brief, perform peri-care, and don clean brief and shorts. Pt transferred supine<>sitting EOB with +2 assist and donned pull over shirt with +2 assist. Donned [name] lift harness sitting EOB with +2 assist. In [place] lift, worked on upright tolerance, static standing balance, and hip/knee extension. Lowered Sara lift while upright and pt able to initiate hip/knee extension and pull himself into standing upright without assist! Pt reported mild R knee pain in standing; returned to sitting and placed pillow at knee support. Stood for 2nd trial and pt demonstrating increased fatigue and with abnormal pelvic and shoulder alignment with difficulty correcting despite verbal/visual/tactile cues. Pt reported increased fatigue and requested to return to bed to be able to make to make it through PM session. Sit<>supine with total A +2 and scooted to [facility] with +2 assist. Concluded session with pt supine in bed, needs within reach, and bed alarm on. Wife present at bedside and pillow underneath RUE for support and improved positioning.   Treatment Session 2 Received pt supine in bed, pt agreeable to PT treatment, and denied any pain during session but reported being "wet". Session with emphasis on toileting, dressing, functional mobility/transfers, generalized strengthening, dynamic sitting balance/coordination, and improved activity tolerance. Recreational therapy present during session. Rolled L/R with max A and required +2 assist to doff shorts. Pt then reported having BM. Doffed dirty brief, performed peri-care, and donned clean brief and shorts with +2 assist and increased time as pt with continued loose BM after therapist finished cleaning him. Due to time restrictions did not  work on standing this afternoon; instead focused on trunk control and core stabilization. Pt transferred supine<>sitting EOB with +2 assist. Worked on dynamic sitting balance reaching outside BOS using LUE at various heights/angles on L and R side with  target of therapist's hand. Pt with increased difficulty reaching to upper R quadrant requiring min/mod A for trunk control. Pt frequently closing eyes and reported fatigue, ultimately requesting to lie down. Sit<>supine with +2 assist and +2 assist to scoot to [facility] with pt assisting by pulling with LUE on headboard. Donned PRAFO's with total A. Concluded session with pt semi-reclined in bed, needs within reach, and bed alarm on. Pt's brother present at bedside.  Therapy Documentation Precautions:  Precautions Precautions: Fall,Other (comment) Precaution Comments: Strong BLE extensor tone, hx of R hand tremor Restrictions Weight Bearing Restrictions: No   Therapy/Group: Individual Therapy Alfonse Alpers PT, DPT   05/22/2020, 7:24 AM

## 2020-05-23 LAB — CBC
HCT: 37.9 % — ABNORMAL LOW (ref 39.0–52.0)
Hemoglobin: 12.1 g/dL — ABNORMAL LOW (ref 13.0–17.0)
MCH: 30.6 pg (ref 26.0–34.0)
MCHC: 31.9 g/dL (ref 30.0–36.0)
MCV: 95.7 fL (ref 80.0–100.0)
Platelets: 223 10*3/uL (ref 150–400)
RBC: 3.96 MIL/uL — ABNORMAL LOW (ref 4.22–5.81)
RDW: 12.9 % (ref 11.5–15.5)
WBC: 7.4 10*3/uL (ref 4.0–10.5)
nRBC: 0 % (ref 0.0–0.2)

## 2020-05-23 LAB — BASIC METABOLIC PANEL
Anion gap: 7 (ref 5–15)
BUN: 9 mg/dL (ref 8–23)
CO2: 29 mmol/L (ref 22–32)
Calcium: 9 mg/dL (ref 8.9–10.3)
Chloride: 102 mmol/L (ref 98–111)
Creatinine, Ser: 0.92 mg/dL (ref 0.61–1.24)
GFR, Estimated: >60 mL/min (ref >60)
Glucose, Bld: 105 mg/dL — ABNORMAL HIGH (ref 70–99)
Potassium: 3.5 mmol/L (ref 3.5–5.1)
Sodium: 138 mmol/L (ref 135–145)

## 2020-05-23 MED ORDER — METHYLPHENIDATE HCL 5 MG PO TABS
5.0000 mg | ORAL_TABLET | Freq: Two times a day (BID) | ORAL | Status: DC
  Administered 2020-05-23 – 2020-05-26 (×6): 5 mg via ORAL
  Filled 2020-05-23 (×6): qty 1

## 2020-05-23 NOTE — Progress Notes (Signed)
Occupational Therapy Weekly Progress Note  Patient Details  Name: Anthony Downs MRN: [id number] Date of Birth: Apr 15, 1950  Beginning of progress report period: May 16, 2020 End of progress report period: May 23, 2020  Today's Date: 05/23/2020 OT Individual Time: 0700-0800 OT Individual Time Calculation (min): 60 min    Patient has met 2 of 3 short term goals. Pt is making steady progress this reporting period with improvements in LLE AROM, sitting balance with S-MIN A in min ranges outside BOS, and MOD A for UB dressing. Pt is improving OOB transfers in sara + to improve WB into BLE.   Patient continues to demonstrate the following deficits: muscle weakness, decreased cardiorespiratoy endurance, impaired timing and sequencing, abnormal tone, unbalanced muscle activation, motor apraxia, decreased coordination and decreased motor planning, left side neglect, decreased motor planning and ideational apraxia, decreased initiation, decreased attention, decreased awareness, decreased problem solving, decreased safety awareness, decreased memory and delayed processing and decreased sitting balance, decreased standing balance, decreased postural control, hemiplegia and decreased balance strategies and therefore will continue to benefit from skilled OT intervention to enhance overall performance with BADL and iADL.  Patient progressing toward long term goals..  Continue plan of care.  OT Short Term Goals Week 3:  OT Short Term Goal 1 (Week 3): Pt will complete 2/4 steps of UB dressing OT Short Term Goal 1 - Progress (Week 3): Met OT Short Term Goal 2 (Week 3): pt will mange trunk from supine>sit with MOD A consistently OT Short Term Goal 2 - Progress (Week 3): Progressing toward goal OT Short Term Goal 3 (Week 3): Pt will use RUE as stabilizer with MIN A consistently OT Short Term Goal 3 - Progress (Week 3): Met Week 4:  OT Short Term Goal 1 (Week 4): Pt will manage trunk Sup>sit wiht MOD A  consistently OT Short Term Goal 2 (Week 4): Pt will tolerate trialing toileting OOB in sara + daily OT Short Term Goal 3 (Week 4): Pt will don shirt with no more than MIN A for sitting balnace on EOB OT Short Term Goal 4 (Week 4): pt will thread 1LE into pants  Skilled Therapeutic Interventions/Progress Updates:    1:1. Pt received in bed awake with no pain. Pt agreeable to trial OOB toileting with sara +2. Pt able to gather/apply toothpaste to toothbrush this date with MIN A for sitting balance only and VC for awareness of postural sway. Pt dons shirt with MOD A at EOB with A for balance and threading sleeve over RUE/pulling down back. Anthony Downs + transfer to [facility] with improved activaiton during partial ROM in stance. Pt able to partially move L foot up onto foot plate this date! Pt unable to void but incontinent of urine in brief therefore total A for peri care in stadnign for WB into BLE. Pt returned to bed. Saebo stim applied to deltoid with good activaiton and skin in tact at end of stim session  Saebo Stim One 330 pulse width 35 Hz pulse rate On 8 sec/ off 8 sec Ramp up/ down 2 sec Symmetrical Biphasic wave form  Max intensity 142m at 500 Ohm load 60 min  Exited session with pt seated in bed, exit alarm on and call light in reach   Therapy Documentation Precautions:  Precautions Precautions: Fall,Other (comment) Precaution Comments: Strong BLE extensor tone, hx of R hand tremor Restrictions Weight Bearing Restrictions: No General:   Vital Signs: Therapy Vitals Temp: 98.3 F (36.8 C) Temp Source: Oral Pulse Rate:  63 Resp: 14 BP: (!) 151/78 Patient Position (if appropriate): Lying Oxygen Therapy SpO2: 96 % O2 Device: Room Air Pain:   ADL: ADL Grooming: Moderate assistance (per pt report) Where Assessed-Grooming: Bed level Upper Body Bathing: Moderate assistance Where Assessed-Upper Body Bathing: Bed level Lower Body Bathing: Dependent (+2 rolling) Where Assessed-Lower  Body Bathing: Bed level Upper Body Dressing: Dependent (+2 for rolling to pull down back) Where Assessed-Upper Body Dressing: Bed level Lower Body Dressing: Dependent Where Assessed-Lower Body Dressing: Bed level Toileting: Unable to assess Vision   Perception    Praxis   Exercises:   Other Treatments:     Therapy/Group: Individual Therapy   M  05/23/2020, 6:50 AM  

## 2020-05-23 NOTE — Progress Notes (Signed)
Physical Therapy Session Note  Patient Details  Name: Anthony Downs MRN: [id number] Date of Birth: July 13, 1950  Today's Date: 05/23/2020 PT Individual Time: 1305-1400 PT Individual Time Calculation (min): 55 min   Short Term Goals: Week 3:  PT Short Term Goal 1 (Week 3): Pt will perform bed mobility with max A +1 consistently. PT Short Term Goal 2 (Week 3): Patient will perform dynamic sitting balance at least 5 min with min A. PT Short Term Goal 3 (Week 3): Patient will perform pressure relief in power w/c indepenently. PT Short Term Goal 4 (Week 3): Patient will propel power w/c on outdoor/community surfaces >150 ft. PT Short Term Goal 5 (Week 3): Patient will perform dependent standing >8 min.  Skilled Therapeutic Interventions/Progress Updates:  Patient supine in bed on entrance to room. Wife present. Patient alert and agreeable to PT session. Patient denied pain throughout session.  Therapeutic Activity: Bed Mobility: Patient performed supine <> sit with max A overall to reach seated position on EOB. Initiated transition with pt able to bring LLE toward EOB with CGA/ Min A. RLE requires Max A with pt demonstrating minimal body movement in bringing R hip/ trunk around to assist with getting RLE closer to EOB. Elevated HOB to assist with decreasing effort to flex forward. LUE grab at bedrail and Max A to bring UB to upright seated position. Max A to complete rotation of R side of body to bring Bil feet to floor for support. Static sitting balance is Fair+ with intermittent need for vc/ tc to maintain upright seated position.   Transfers: Patient performed STS transfers using [name]. Pil[place]ow inserted in front of R knee for improved comfort. Once set up in sling and arms on platforms with Bil hands grasped to handles, pt is brought to standing position with pt able to complete final 25% of rise to stand without physical assist. On second rise to stand, pt is able to complete final 50% of  transfer to upright stance. In return to sit, pt is able to control descent using BLE and BUE for first 50% of descent and then requires physical assist from [place] to control descent to sit EOB.  Provided vc/ tc for effort, technique, initiation of movement   Neuromuscular Re-ed: NMR facilitated during session with focus on standing tolerance/ balance and BLE motor control/ coordination. +2 available for safety. Prior to sitting up on EOB, pt guided in heel slides x3 for each BLE with LLE performing with Min A into flexion and pt able to push against light resistance to extend. RLE requires Max A with max cues to perform, but minimal resistance noted against movement of LE. Trace muscle contraction in quad and hamstring felt in final 25% of push into extension.  Pt guided in standing with and without full physical assist as well as in performance for minisquats with focus on hip/ knee flex/ ext coordination and control with equal WB and effort bilaterally. Pt maintains upright posture/ stance with CGA in first stance with [name] and sling loosened to allow for pt mobility. Pt guided in performance of 2 minisquats with pt demonstrating minimal hip/ knee flexion with first performance and improving BLE flexion following standing rest break.  Prior to final stance, PT requested pt to perform 5 minisquats prior to sitting down to end session. Pt does not think he can perform but willing to try. Pt is able to complete 5 reps requiring multimodal cues for initiating movement change from descent to  return to upright stance. He is able to increase range into hip/ knee flexion with each performance finally reaching distance that is halfway to sitting EOB.  NMR performed for improvements in motor control and coordination, balance, sequencing, judgement, and self confidence/ efficacy in performing all aspects of mobility at highest level of independence.   Patient supine in bed at end of session and PRAFO's  donned for ankle contracture mgmt. Bed brakes locked, bed alarm set, wife present, and all needs within reach. Pt and wife very pleased with performance during session.    Therapy Documentation Precautions:  Precautions Precautions: Fall,Other (comment) Precaution Comments: Strong BLE extensor tone, hx of R hand tremor Restrictions Weight Bearing Restrictions: No  Therapy/Group: Individual Therapy  Alger Simons PT, DPT 05/23/2020, 7:14 PM

## 2020-05-23 NOTE — Progress Notes (Signed)
Physical Therapy Session Note  Patient Details  Name: Anthony Downs MRN: [id number] Date of Birth: 1950/08/09  Today's Date: 05/23/2020 PT Individual Time: 5301-0404 PT Individual Time Calculation (min): 70 min   Short Term Goals: Week 3:  PT Short Term Goal 1 (Week 3): Pt will perform bed mobility with max A +1 consistently. PT Short Term Goal 2 (Week 3): Patient will perform dynamic sitting balance at least 5 min with min A. PT Short Term Goal 3 (Week 3): Patient will perform pressure relief in power w/c indepenently. PT Short Term Goal 4 (Week 3): Patient will propel power w/c on outdoor/community surfaces >150 ft. PT Short Term Goal 5 (Week 3): Patient will perform dependent standing >8 min.  Skilled Therapeutic Interventions/Progress Updates:   Pt received supine in bed and agreeable to PT. Supine>sit transfer with total A of 1. [name] transfer to [facility] with mod assist to maintain balance sitting EOB to don sling. Pt performed WC mobility through hall of rehab unit 2x 257f, through food court in atrium x [id number] over cement sidewalk x [id number] 2 with cues for turning technique and safety awareness to prevent hitting obstacles. Pt returned to room and performed SarClarise Cruzansfer to bed with +2 assist for safety. Sit>supine completed with +2 assist for safety and management of trunk and BLE. Pt left supine in bed with call bell in reach and all needs met.         Therapy Documentation Precautions:  Precautions Precautions: Fall,Other (comment) Precaution Comments: Strong BLE extensor tone, hx of R hand tremor Restrictions Weight Bearing Restrictions: No Vital Signs: Therapy Vitals Temp: 98.3 F (36.8 C) Temp Source: Oral Pulse Rate: (!) 59 Resp: 18 BP: (!) 146/116 Patient Position (if appropriate): Lying Oxygen Therapy SpO2: 100 % O2 Device: Room Air Pain:   denies  Therapy/Group: Individual Therapy  AusLorie Phenix6/2022, 4:37 PM

## 2020-05-23 NOTE — Progress Notes (Signed)
PROGRESS NOTE   Subjective/Complaints: Up early with therapy today. A bit fatigued now. Slept pretty well last night.   ROS: Patient denies fever, rash, sore throat, blurred vision, nausea, vomiting, diarrhea, cough, shortness of breath or chest pain, joint or back pain, headache, or mood change.    Objective:   No results found. Recent Labs    05/23/20 0509  WBC 7.4  HGB 12.1*  HCT 37.9*  PLT 223   Recent Labs    05/23/20 0509  NA 138  K 3.5  CL 102  CO2 29  GLUCOSE 105*  BUN 9  CREATININE 0.92  CALCIUM 9.0    Intake/Output Summary (Last 24 hours) at 05/23/2020 1113 Last data filed at 05/23/2020 0900 Gross per 24 hour  Intake 840 ml  Output --  Net 840 ml        Physical Exam: Vital Signs Blood pressure (!) 145/74, pulse 64, temperature 98.3 F (36.8 C), temperature source Oral, resp. rate 14, height 6' (1.829 m), weight 124.7 kg, SpO2 96 %. Constitutional: No distress . Vital signs reviewed. HEENT: EOMI, oral membranes moist Neck: supple Cardiovascular: RRR without murmur. No JVD    Respiratory/Chest: CTA Bilaterally without wheezes or rales. Normal effort    GI/Abdomen: BS +, non-tender, non-distended Ext: no clubbing, cyanosis, or edema Psych: pleasant and cooperative Skin: scalp incision clean, dry.  Neurologic: apraxic movments, speech. Tremor in RUE. Cranial nerves II through XII intact, motor strength is 5/5 in Left and 0/5 right deltoid, bicep, grip 1/5. Trace at best/5 right hip flexor, knee extensors, 1+ left hip flexor, KE---didn't move left leg much for me today 5/6. 0/5 ankle dorsiflexor and plantar flexor bilaterally. Continued extensor tone.  Musculoskeletal: bilateral shoulder discomfort with ROM      Assessment/Plan: 1. Functional deficits which require 3+ hours per day of interdisciplinary therapy in a comprehensive inpatient rehab setting.  Physiatrist is providing close team  supervision and 24 hour management of active medical problems listed below.  Physiatrist and rehab team continue to assess barriers to discharge/monitor patient progress toward functional and medical goals  Care Tool:  Bathing    Body parts bathed by patient: Left arm,Chest,Abdomen,Face   Body parts bathed by helper: Right arm,Front perineal area,Buttocks,Right upper leg,Left upper leg,Right lower leg,Left lower leg     Bathing assist Assist Level: 2 Helpers     Upper Body Dressing/Undressing Upper body dressing   What is the patient wearing?: Pull over shirt    Upper body assist Assist Level: Total Assistance - Patient < 25%    Lower Body Dressing/Undressing Lower body dressing      What is the patient wearing?: Pants     Lower body assist Assist for lower body dressing: 2 Helpers     Toileting Toileting    Toileting assist Assist for toileting: 2 Helpers     Transfers Chair/bed transfer  Transfers assist  Chair/bed transfer activity did not occur: Safety/medical concerns  Chair/bed transfer assist level: Dependent - mechanical lift     Locomotion Ambulation   Ambulation assist   Ambulation activity did not occur: Safety/medical concerns          Walk 10  feet activity   Assist  Walk 10 feet activity did not occur: Safety/medical concerns        Walk 50 feet activity   Assist Walk 50 feet with 2 turns activity did not occur: Safety/medical concerns         Walk 150 feet activity   Assist Walk 150 feet activity did not occur: Safety/medical concerns         Walk 10 feet on uneven surface  activity   Assist Walk 10 feet on uneven surfaces activity did not occur: Safety/medical concerns         Wheelchair     Assist Will patient use wheelchair at discharge?: Yes (TBD but likely) Type of Wheelchair: Power    Wheelchair assist level: Supervision/Verbal cueing Max wheelchair distance: >100 ft    Wheelchair 50 feet  with 2 turns activity    Assist    Wheelchair 50 feet with 2 turns activity did not occur: Safety/medical concerns   Assist Level: Supervision/Verbal cueing   Wheelchair 150 feet activity     Assist  Wheelchair 150 feet activity did not occur: Safety/medical concerns       Blood pressure (!) 145/74, pulse 64, temperature 98.3 F (36.8 C), temperature source Oral, resp. rate 14, height 6' (1.829 m), weight 124.7 kg, SpO2 96 %.    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 Decadron taper -Patient may  Shower with cap -ELOS/Goals: 3-4 wks minA goals  -Continue CIR therapies including PT, OT SLP. I have discussed goals of care with wife/pt.    5/6 -I am going to initiate ritalin to help with day time arousal and initiation. Spoke with pt and wife about it and both agree with trial       -path is positive for only Grade 1 meningioma. We'll check with NS, but likely can discuss radiation (if needed) at office after discharge   2. Left posterior tibial vein DVT:  Vascular ultrasound positive for age indeterminate left posterior tibial vein.   -continue xarelto. -antiplatelet therapy: N/A 3. Post-operative pain:tylenol prn  -voltaren gel for right shoulder---will rx left shoulder as well   -work on appropriate techniques, ROM with therapy  -continue baclofen 10mg  qhs for cramps and to assist with sleep   4. Mood/sleep:  improved  -improved with scheduled trazodone 50mg ---continue  - rx pain, on baclofen too -antipsychotic agents: N/A  -generally improving-continue with trazodone and melatonin  -added ritalin to help with sleep/wake cycle 5. Neuropsych: This patientiscapable of making decisions on hisown behalf. 6. Skin/Wound Care:rash  resolved   -double sheeting bed   -turn in bed as needed 7. Fluids/Electrolytes/Nutrition:eating well     8. Hypertension. Lasix 20 mg daily   -Restarted home Flomax HS which will also help with BP   -no norvasc d/t swelling   5/2 lasix increased to 40mg  daily for elevated bp's   5/6 bp improved.     -continue metoprolol    -lasix at 20mg  daily 9. Hyperlipidemia. Lipitor 10. BPH: increased Flomax to 0.8mg  HS 11. Urinary frequency: foley out. Continent/incontinent  -UA negative.  timed voiding q2H while awake with urinal- placed nursing order   -Flomax to 0.8mg  given continued symptoms and elevated BP 12. Leukocytosis. Likely d/t steroids. Afebrile. No s/s of infection   -continue to follow   - decadron  off 13. Constipation:  -continue senna-s bid  -moving  bowels regularly now  LOS: 21 days A FACE TO [place] 05/23/2020, 11:13 AM

## 2020-05-24 NOTE — Progress Notes (Signed)
Physical Therapy Session Note  Patient Details  Name: Anthony Downs MRN: [id number] Date of Birth: 1950/12/04  Today's Date: 05/24/2020 PT Individual Time: 0800-0900 PT Individual Time Calculation (min): 60 min   Short Term Goals: Week 1:  PT Short Term Goal 1 (Week 1): Pt will perform R/L rolling in bed with +2 mod assist using bed features PT Short Term Goal 1 - Progress (Week 1): Partly met (roll R with mod A +1, roll L with max A +2) PT Short Term Goal 2 (Week 1): Pt will tolerate sitting EOB for at least 15 minutes during therapy PT Short Term Goal 2 - Progress (Week 1): Progressing toward goal PT Short Term Goal 3 (Week 1): Pt will maintain dynamic sitting balance EOM with max assist for at least 3 minutes PT Short Term Goal 3 - Progress (Week 1): Progressing toward goal PT Short Term Goal 4 (Week 1): Pt will perform supine<>sitting with +2 max assist PT Short Term Goal 4 - Progress (Week 1): Met PT Short Term Goal 5 (Week 1): Pt will tolerate sitting EOB for at least 15 minutes during therapy Week 2:  PT Short Term Goal 1 (Week 2): Pt will maintain dynamic sitting balance EOM with max assist for at least 3 minutes PT Short Term Goal 1 - Progress (Week 2): Met PT Short Term Goal 2 (Week 2): Pt will perform bed mobility with max A +1 consistently. PT Short Term Goal 2 - Progress (Week 2): Progressing toward goal PT Short Term Goal 3 (Week 2): Patient will tolerate dependent lower extremity weight bearing >5 min using LRAD. PT Short Term Goal 3 - Progress (Week 2): Met PT Short Term Goal 4 (Week 2): Patient will initiate power mobility. PT Short Term Goal 4 - Progress (Week 2): Met Week 3:  PT Short Term Goal 1 (Week 3): Pt will perform bed mobility with max A +1 consistently. PT Short Term Goal 2 (Week 3): Patient will perform dynamic sitting balance at least 5 min with min A. PT Short Term Goal 3 (Week 3): Patient will perform pressure relief in power w/c indepenently. PT  Short Term Goal 4 (Week 3): Patient will propel power w/c on outdoor/community surfaces >150 ft. PT Short Term Goal 5 (Week 3): Patient will perform dependent standing >8 min.  Skilled Therapeutic Interventions/Progress Updates:    Pain:  Pt reports NO pain.  Treatment to tolerance.  Rest breaks and repositioning as needed.  Pt initially supine w/wife at bedside and agreeable to treatment session. Pt rolls L/R w/max assist and cues for sequencing, uses LUE well w/task, hand over hand to initiate RUE, able to grasp w/hand, able to extend at shoulder.  Assists w/front perineal care w/set up, total assist for posterior.  Repeats rolling mult times for brief change, therapist donning shorts, TEDS, socks. Supine to side to sit w/max of 2, pt w/delayed processing but assists w/LUE for side to sit.  Total assist for scooting in sitting Static sit w/cga Dons shirt w/max assist for donning, min assist for sitting blanace. Sit to stand using Sara mechanical lift to 80% upright.  From here, pt able to use UEs, trunk, gluts/quads to achieve full upright stand w/RLE blocked in [place].  Maintains briefly.  Repeated x 3 Transferred to [place] via [name].  Repositioned using PWC tilt feature.  LEs secured w/chin guard, seatbelt fastened. Propels wc x 163ft to dayroom, slowly but w/supervision.  In wc w/back fullty reclined worked on A/P wt shifting via  reaching, simulated rowing motion w/therapist.  Fatigues quickly and requires frequent rest but good trunk acitvation, poor ability to identify midline A/P.  Performs 2 sets of several reps, states "this is hard".  Pt falling asleep at end of session.   Pt transported back to room.  Pt left oob in [place], seatbelt secured and wc tilted for safety, wife at bedside.   Therapy Documentation Precautions:  Precautions Precautions: Fall,Other (comment) Precaution Comments: Strong BLE extensor tone, hx of R hand tremor Restrictions Weight Bearing Restrictions:  No    Therapy/Group: Individual Therapy  Callie Fielding, [place] 05/24/2020, 1:32 PM

## 2020-05-24 NOTE — Progress Notes (Signed)
Occupational Therapy Session Note  Patient Details  Name: Anthony Downs MRN: [id number] Date of Birth: [birth year]  Today's Date: 05/24/2020 OT Individual Time: 1100-1200 OT Individual Time Calculation (min): 60 min    Short Term Goals: Week 1:  OT Short Term Goal 1 (Week 1): Pt will roll with MAX A of 1 caregiver to decrease BOC for toileitng OT Short Term Goal 1 - Progress (Week 1): Met OT Short Term Goal 2 (Week 1): Pt will tolerate laying sidelying on R 1x per day to assist with tone management OT Short Term Goal 2 - Progress (Week 1): Met OT Short Term Goal 3 (Week 1): Pt will recall hemi dressing techniques with MOD VC OT Short Term Goal 3 - Progress (Week 1): Progressing toward goal OT Short Term Goal 4 (Week 1): Pt will sit EOB 10 min with mod A in prep for ADL OT Short Term Goal 4 - Progress (Week 1): Met  Skilled Therapeutic Interventions/Progress Updates:    1:1. Pt received in [place] agreeable to OT but very upset. Pt mad that previous therapist "kept saying 5 Downs" referring to chair sit ups. OT explained that sometimes we need to push you, but agreed to give pt a goal for the day prior to activity. Pt steers PWC into dayroom. Pt stands in sara+ with total A for WB and attempts to play Wii bowling, however pt has difficulty with speed and timing of the remote despite being on automatic setting requiring MAX HOH A for 2 frames prior to seated break. During second standing trial pt completes mini squats x5 in sara+ in prep for functional transfer. Pt R knee trying to slide laterally out of support and pt sat back into chair. Attempted to engage pt in towel gides at table, but pt fatigeud and falling asleep unable ot attend to activity. Returned to room/bed in sara+.   [place] Stim One 330 pulse width 35 Hz pulse rate On 8 sec/ off 8 sec Ramp up/ down 2 sec Symmetrical Biphasic wave form  Max intensity 174mA at 500 Ohm load Applied to deltoid for 60 min unattended stim with wife  supervising. Skin in tact at end of session.  Exited session with pt seated in bed, exit alarm on and call light in reach   Therapy Documentation Precautions:  Precautions Precautions: Fall,Other (comment) Precaution Comments: Strong BLE extensor tone, hx of R hand tremor Restrictions Weight Bearing Restrictions: No General:   Vital Signs: Therapy Vitals Temp: 98.2 F (36.8 C) Temp Source: Oral Pulse Rate: 80 Resp: 16 BP: (!) 155/76 Patient Position (if appropriate): Lying Oxygen Therapy SpO2: 97 % O2 Device: Room Air Pain:   ADL: ADL Grooming: Moderate assistance (per pt report) Where Assessed-Grooming: Bed level Upper Body Bathing: Moderate assistance Where Assessed-Upper Body Bathing: Bed level Lower Body Bathing: Dependent (+2 rolling) Where Assessed-Lower Body Bathing: Bed level Upper Body Dressing: Dependent (+2 for rolling to pull down back) Where Assessed-Upper Body Dressing: Bed level Lower Body Dressing: Dependent Where Assessed-Lower Body Dressing: Bed level Toileting: Unable to assess Vision   Perception    Praxis   Exercises:   Other Treatments:     Therapy/Group: Individual Therapy  [name] 05/24/2020, 6:51 AM

## 2020-05-24 NOTE — Progress Notes (Signed)
PROGRESS NOTE   Subjective/Complaints:  Pt reports no issues- LBM yesterday- denies HA or pain.  Working with therapy currently- wife in room.   ROS:  Pt denies SOB, abd pain, CP, N/V/C/D, and vision changes  Objective:   No results found. Recent Labs    05/23/20 0509  WBC 7.4  HGB 12.1*  HCT 37.9*  PLT 223   Recent Labs    05/23/20 0509  NA 138  K 3.5  CL 102  CO2 29  GLUCOSE 105*  BUN 9  CREATININE 0.92  CALCIUM 9.0    Intake/Output Summary (Last 24 hours) at 05/24/2020 1456 Last data filed at 05/24/2020 0827 Gross per 24 hour  Intake 890 ml  Output --  Net 890 ml        Physical Exam: Vital Signs Blood pressure (!) 142/76, pulse 63, temperature 98 F (36.7 C), temperature source Oral, resp. rate 19, height 6' (1.829 m), weight 124.4 kg, SpO2 97 %.    General: awake, alert, appropriate, laying in bed- therapy working getting him dressed; wife at bedside; NAD HENT: conjugate gaze; oropharynx moist CV: regular rate; no JVD Pulmonary: CTA B/L; no W/R/R- good air movement GI: soft, NT, ND, (+)BS Psychiatric: appropriate Neurological: alert  Ext: no clubbing, cyanosis, or edema Psych: pleasant and cooperative Skin: scalp incision clean, dry.  Neurologic: apraxic movments, speech. Tremor in RUE. Cranial nerves II through XII intact, motor strength is 5/5 in Left and 0/5 right deltoid, bicep, grip 1/5. Trace at best/5 right hip flexor, knee extensors, 1+ left hip flexor, KE---didn't move left leg much for me today 5/6. 0/5 ankle dorsiflexor and plantar flexor bilaterally. Continued extensor tone.  Musculoskeletal: bilateral shoulder discomfort with ROM      Assessment/Plan: 1. Functional deficits which require 3+ hours per day of interdisciplinary therapy in a comprehensive inpatient rehab setting.  Physiatrist is providing close team supervision and 24 hour management of active medical problems  listed below.  Physiatrist and rehab team continue to assess barriers to discharge/monitor patient progress toward functional and medical goals  Care Tool:  Bathing    Body parts bathed by patient: Left arm,Chest,Abdomen,Face   Body parts bathed by helper: Right arm,Front perineal area,Buttocks,Right upper leg,Left upper leg,Right lower leg,Left lower leg     Bathing assist Assist Level: 2 Helpers     Upper Body Dressing/Undressing Upper body dressing   What is the patient wearing?: Pull over shirt    Upper body assist Assist Level: Total Assistance - Patient < 25%    Lower Body Dressing/Undressing Lower body dressing      What is the patient wearing?: Pants     Lower body assist Assist for lower body dressing: 2 Helpers     Toileting Toileting    Toileting assist Assist for toileting: 2 Helpers     Transfers Chair/bed transfer  Transfers assist  Chair/bed transfer activity did not occur: Safety/medical concerns  Chair/bed transfer assist level: Dependent - mechanical lift     Locomotion Ambulation   Ambulation assist   Ambulation activity did not occur: Safety/medical concerns          Walk 10 feet activity   Assist  Walk 10 feet activity did not occur: Safety/medical concerns        Walk 50 feet activity   Assist Walk 50 feet with 2 turns activity did not occur: Safety/medical concerns         Walk 150 feet activity   Assist Walk 150 feet activity did not occur: Safety/medical concerns         Walk 10 feet on uneven surface  activity   Assist Walk 10 feet on uneven surfaces activity did not occur: Safety/medical concerns         Wheelchair     Assist Will patient use wheelchair at discharge?: Yes (TBD but likely) Type of Wheelchair: Power    Wheelchair assist level: Supervision/Verbal cueing Max wheelchair distance: >100 ft    Wheelchair 50 feet with 2 turns activity    Assist    Wheelchair 50 feet  with 2 turns activity did not occur: Safety/medical concerns   Assist Level: Supervision/Verbal cueing   Wheelchair 150 feet activity     Assist  Wheelchair 150 feet activity did not occur: Safety/medical concerns       Blood pressure (!) 142/76, pulse 63, temperature 98 F (36.7 C), temperature source Oral, resp. rate 19, height 6' (1.829 m), weight 124.4 kg, SpO2 97 %.    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 Decadron taper -Patient may  Shower with cap -ELOS/Goals: 3-4 wks minA goals  -Continue CIR therapies including PT, OT SLP. I have discussed goals of care with wife/pt.    5/6 -I am going to initiate ritalin to help with day time arousal and initiation. Spoke with pt and wife about it and both agree with trial       -path is positive for only Grade 1 meningioma. We'll check with NS, but likely can discuss radiation (if needed) at office after discharge  5/7- con't PT, OT and SLP 2. Left posterior tibial vein DVT:  Vascular ultrasound positive for age indeterminate left posterior tibial vein.   -continue xarelto. -antiplatelet therapy: N/A 3. Post-operative pain:tylenol prn  -voltaren gel for right shoulder---will rx left shoulder as well   -work on appropriate techniques, ROM with therapy  -continue baclofen 10mg  qhs for cramps and to assist with sleep   4. Mood/sleep:  improved  -improved with scheduled trazodone 50mg ---continue  - rx pain, on baclofen too -antipsychotic agents: N/A  -generally improving-continue with trazodone and melatonin  -added ritalin to help with sleep/wake cycle  5/7- per wife, more awake, more interactive- con't regimen 5. Neuropsych: This patientiscapable of making decisions on hisown behalf. 6. Skin/Wound  Care:rash resolved   -double sheeting bed   -turn in bed as needed 7. Fluids/Electrolytes/Nutrition:eating well     8. Hypertension. Lasix 20 mg daily   -Restarted home Flomax HS which will also help with BP   -no norvasc d/t swelling   5/2 lasix increased to 40mg  daily for elevated bp's   5/6 bp improved.     -continue metoprolol    -lasix at 20mg  daily  5/7- BP slightly elevated 202R/427C systolic- con't regiomen for today 9. Hyperlipidemia. Lipitor 10. BPH: increased Flomax to 0.8mg  HS 11. Urinary frequency: foley out. Continent/incontinent  -UA negative.  timed voiding q2H while awake with urinal- placed nursing order   -Flomax to 0.8mg  given continued symptoms and elevated BP 12. Leukocytosis. Likely d/t steroids. Afebrile. No s/s of  infection   -continue to follow   - decadron  off 13. Constipation:  -continue senna-s bid  -moving bowels regularly now  5/7- going daily- con't regimen  LOS: 22 days A FACE TO FACE EVALUATION WAS PERFORMED  Lex Linhares 05/24/2020, 2:56 PM

## 2020-05-25 NOTE — Progress Notes (Signed)
Occupational Therapy Session Note  Patient Details  Name: Anthony Downs MRN: [id number] Date of Birth: 10/07/1950  Today's Date: 05/25/2020 OT Individual Time: 1350-1448 OT Individual Time Calculation (min): 58 min   Skilled Therapeutic Interventions/Progress Updates:    Pt greeted in bed with no c/o pain. Family present. Started with hands on family training/education. OT and wife donned pts Ted hose. OT taught wife how to use adaptive fold method and also plastic bag method, wife preferring plastic bag method for increased ease of this LB dressing task. OT and wife then assisted pt with donning his pants, pt rolling Rt>Lt with Total-Max A respectively. +2 for using maxi slides to transfer pt to the tilt table. We used a pillow to support his head and were very careful when repositioning upper body for crani site safety. Worked on LE weightbearing, upright tolerance, and alertness. Pt tolerating upright tilt of 25 degrees, 35 degrees, 40 degrees, and 50 degrees. Pt stating he felt "wonderful," no c/o dizziness. BP assessed at 40 degrees with reading of 137/77. We played meaningful music with pt singing along, increased cervical control noted with pt lifting head off of the table. OT performed gentle stretching and AAROM/PROM of the tremulous Rt UE., resistive pushes completed x3. Discussed with family importance of mobilizing the Rt UE for edema prevention. Wife reports that she has been performing ROM for the Rt UE and B LEs as much as she is able. +2 for returning pt to the bed and for repositioning him for comfort. OT assisted wife with doffing pts clothing, per request, and then he remained in bed with all needs within reach and bed alarm set. Noted very good alertness/active participation from pt during tx today.  Note pt without any active movement in either LE during session, per family this is unusual Therapy Documentation Precautions:  Precautions Precautions: Fall,Other  (comment) Precaution Comments: Strong BLE extensor tone, hx of R hand tremor Restrictions Weight Bearing Restrictions: No Vital Signs: Therapy Vitals Temp: 97.9 F (36.6 C) Temp Source: Oral Pulse Rate: 68 Resp: 18 BP: (!) 147/77 Patient Position (if appropriate): Lying Oxygen Therapy SpO2: 97 % O2 Device: Room Air  ADL: ADL Grooming: Moderate assistance (per pt report) Where Assessed-Grooming: Bed level Upper Body Bathing: Moderate assistance Where Assessed-Upper Body Bathing: Bed level Lower Body Bathing: Dependent (+2 rolling) Where Assessed-Lower Body Bathing: Bed level Upper Body Dressing: Dependent (+2 for rolling to pull down back) Where Assessed-Upper Body Dressing: Bed level Lower Body Dressing: Dependent Where Assessed-Lower Body Dressing: Bed level Toileting: Unable to assess     Therapy/Group: Individual Therapy  Genesia Caslin A Keesha Pellum 05/25/2020, 3:47 PM

## 2020-05-25 NOTE — Progress Notes (Signed)
PROGRESS NOTE   Subjective/Complaints:  Pt reports last night around 3am, a spot that was swollen on his crani site started draining fluid- was wet, but couldn't see color.   Wife also noticed and was concerned about it and asked for NSU to be called.   Otherwise, no issues.  ROS:   Pt denies SOB, abd pain, CP, N/V/C/D, and vision changes  Objective:   No results found. Recent Labs    05/23/20 0509  WBC 7.4  HGB 12.1*  HCT 37.9*  PLT 223   Recent Labs    05/23/20 0509  NA 138  K 3.5  CL 102  CO2 29  GLUCOSE 105*  BUN 9  CREATININE 0.92  CALCIUM 9.0    Intake/Output Summary (Last 24 hours) at 05/25/2020 1019 Last data filed at 05/24/2020 1844 Gross per 24 hour  Intake 480 ml  Output --  Net 480 ml        Physical Exam: Vital Signs Blood pressure 140/81, pulse 79, temperature 97.6 F (36.4 C), temperature source Oral, resp. rate 18, height 6' (1.829 m), weight 124.5 kg, SpO2 97 %.     General: awake, alert, appropriate, laying in bed; wife at bedside; NAD HENT: conjugate gaze; oropharynx moist; has dressing on top part of crani incision- sutures vs staples out- healed except 1 spot on head- draining serous drainage. Dressing C/D/I CV: regular rate; no JVD Pulmonary: CTA B/L; no W/R/R- good air movement GI: soft, NT, ND, (+)BS Psychiatric: appropriate; slowed to respond, but cordial Neurological: delayed responses  Ext: no clubbing, cyanosis, or edema Psych: pleasant and cooperative Skin: scalp incision clean, dry except as above  Neurologic: apraxic movments, speech. Tremor in RUE. Cranial nerves II through XII intact, motor strength is 5/5 in Left and 0/5 right deltoid, bicep, grip 1/5. Trace at best/5 right hip flexor, knee extensors, 1+ left hip flexor, KE---didn't move left leg much for me today 5/6. 0/5 ankle dorsiflexor and plantar flexor bilaterally. Continued extensor tone.  Musculoskeletal:  bilateral shoulder discomfort with ROM      Assessment/Plan: 1. Functional deficits which require 3+ hours per day of interdisciplinary therapy in a comprehensive inpatient rehab setting.  Physiatrist is providing close team supervision and 24 hour management of active medical problems listed below.  Physiatrist and rehab team continue to assess barriers to discharge/monitor patient progress toward functional and medical goals  Care Tool:  Bathing    Body parts bathed by patient: Left arm,Chest,Abdomen,Face   Body parts bathed by helper: Right arm,Front perineal area,Buttocks,Right upper leg,Left upper leg,Right lower leg,Left lower leg     Bathing assist Assist Level: 2 Helpers     Upper Body Dressing/Undressing Upper body dressing   What is the patient wearing?: Pull over shirt    Upper body assist Assist Level: Total Assistance - Patient < 25%    Lower Body Dressing/Undressing Lower body dressing      What is the patient wearing?: Pants     Lower body assist Assist for lower body dressing: 2 Helpers     Toileting Toileting    Toileting assist Assist for toileting: 2 Helpers     Transfers Chair/bed transfer  Transfers assist  Chair/bed transfer activity did not occur: Safety/medical concerns  Chair/bed transfer assist level: Dependent - mechanical lift     Locomotion Ambulation   Ambulation assist   Ambulation activity did not occur: Safety/medical concerns          Walk 10 feet activity   Assist  Walk 10 feet activity did not occur: Safety/medical concerns        Walk 50 feet activity   Assist Walk 50 feet with 2 turns activity did not occur: Safety/medical concerns         Walk 150 feet activity   Assist Walk 150 feet activity did not occur: Safety/medical concerns         Walk 10 feet on uneven surface  activity   Assist Walk 10 feet on uneven surfaces activity did not occur: Safety/medical concerns          Wheelchair     Assist Will patient use wheelchair at discharge?: Yes (TBD but likely) Type of Wheelchair: Power    Wheelchair assist level: Supervision/Verbal cueing Max wheelchair distance: >100 ft    Wheelchair 50 feet with 2 turns activity    Assist    Wheelchair 50 feet with 2 turns activity did not occur: Safety/medical concerns   Assist Level: Supervision/Verbal cueing   Wheelchair 150 feet activity     Assist  Wheelchair 150 feet activity did not occur: Safety/medical concerns       Blood pressure 140/81, pulse 79, temperature 97.6 F (36.4 C), temperature source Oral, resp. rate 18, height 6' (1.829 m), weight 124.5 kg, SpO2 97 %.    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 Decadron taper -Patient may  Shower with cap -ELOS/Goals: 3-4 wks minA goals  -Continue CIR therapies including PT, OT SLP. I have discussed goals of care with wife/pt.    5/6 -I am going to initiate ritalin to help with day time arousal and initiation. Spoke with pt and wife about it and both agree with trial       -path is positive for only Grade 1 meningioma. We'll check with NS, but likely can discuss radiation (if needed) at office after discharge  5con't PT, OT and SLP- called [facility] to see pt- Spoke to Dr Cyndy Freeze- he said would see pt later today about possible Seroma on head draining vs unknown drainage? 2. Left posterior tibial vein DVT:  Vascular ultrasound positive for age indeterminate left posterior tibial vein.   -continue xarelto. -antiplatelet therapy: N/A 3. Post-operative pain:tylenol prn  -voltaren gel for right shoulder---will rx left shoulder as well   -work on appropriate techniques, ROM with therapy  -continue baclofen 10mg  qhs for cramps and to  assist with sleep   4. Mood/sleep:  improved  -improved with scheduled trazodone 50mg ---continue  - rx pain, on baclofen too -antipsychotic agents: N/A  -generally improving-continue with trazodone and melatonin  -added ritalin to help with sleep/wake cycle  5/7-8- pt more awake, interactive- con't regimen 5. Neuropsych: This patientiscapable of making decisions on hisown behalf. 6. Skin/Wound Care:rash resolved   -double sheeting bed   -turn in bed as needed 7. Fluids/Electrolytes/Nutrition:eating well     8. Hypertension. Lasix 20 mg daily   -Restarted home Flomax HS which will also help with BP   -no norvasc d/t swelling   5/2 lasix increased to 40mg  daily for elevated bp's  5/6 bp improved.     -continue metoprolol    -lasix at 20mg  daily  5/7- BP slightly elevated 893T/342A systolic- con't regiomen for today  5/8- BP 140/81 this AM- con't regimen 9. Hyperlipidemia. Lipitor 10. BPH: increased Flomax to 0.8mg  HS 11. Urinary frequency: foley out. Continent/incontinent  -UA negative.  timed voiding q2H while awake with urinal- placed nursing order   -Flomax to 0.8mg  given continued symptoms and elevated BP 12. Leukocytosis. Likely d/t steroids. Afebrile. No s/s of infection   -continue to follow   - decadron  off 13. Constipation:  -continue senna-s bid  -moving bowels regularly now  5/7- going daily- con't regimen   I spent a total of 25 minutes on pt today- >50% on coordination of care- calling [facility]U, speaking with and examining pt, as well as speaking with pt's wife outside the room.    LOS: 23 days A FACE TO FACE EVALUATION WAS PERFORMED  Anthony Downs 05/25/2020, 10:19 AM

## 2020-05-26 MED ORDER — METHYLPHENIDATE HCL 5 MG PO TABS
10.0000 mg | ORAL_TABLET | Freq: Two times a day (BID) | ORAL | Status: DC
  Administered 2020-05-26 – 2020-06-10 (×30): 10 mg via ORAL
  Filled 2020-05-26 (×30): qty 2

## 2020-05-26 NOTE — Progress Notes (Signed)
PROGRESS NOTE   Subjective/Complaints:  Pt up in bed. No new complaints. Feels that ritalin has helped with his energy levels.   ROS: Limited due to cognitive/behavioral    Objective:   No results found. No results for input(s): WBC, HGB, HCT, PLT in the last 72 hours. No results for input(s): NA, K, CL, CO2, GLUCOSE, BUN, CREATININE, CALCIUM in the last 72 hours.  Intake/Output Summary (Last 24 hours) at 05/26/2020 1049 Last data filed at 05/26/2020 0848 Gross per 24 hour  Intake 480 ml  Output --  Net 480 ml        Physical Exam: Vital Signs Blood pressure (!) 142/85, pulse 69, temperature 97.8 F (36.6 C), resp. rate 16, height 6' (1.829 m), weight 124.5 kg, SpO2 97 %.     Constitutional: No distress . Vital signs reviewed. HEENT: EOMI, oral membranes moist Neck: supple Cardiovascular: RRR without murmur. No JVD    Respiratory/Chest: CTA Bilaterally without wheezes or rales. Normal effort    GI/Abdomen: BS +, non-tender, non-distended Ext: no clubbing, cyanosis, or edema Psych: pleasant and cooperative Skin: scalp incision clean, no drainage seen today  Neurologic: apraxic movments, speech. Tremor in RUE. Cranial nerves II through XII intact, motor strength is 5/5 in Left and 0/5 right deltoid, bicep, grip 1/5. Trace at best/5 right hip flexor, knee extensors, 1+ left hip flexor, KE--no changes. 0/5 ankle dorsiflexor and plantar flexor bilaterally. Continued extensor tone.  Musculoskeletal: mild bilateral shoulder discomfort with ROM      Assessment/Plan: 1. Functional deficits which require 3+ hours per day of interdisciplinary therapy in a comprehensive inpatient rehab setting.  Physiatrist is providing close team supervision and 24 hour management of active medical problems listed below.  Physiatrist and rehab team continue to assess barriers to discharge/monitor patient progress toward functional and  medical goals  Care Tool:  Bathing    Body parts bathed by patient: Left arm,Chest,Abdomen,Front perineal area,Face   Body parts bathed by helper: Right arm,Buttocks,Right upper leg,Left upper leg,Right lower leg,Left lower leg     Bathing assist Assist Level: Maximal Assistance - Patient 24 - 49%     Upper Body Dressing/Undressing Upper body dressing   What is the patient wearing?: Pull over shirt    Upper body assist Assist Level: Moderate Assistance - Patient 50 - 74%    Lower Body Dressing/Undressing Lower body dressing      What is the patient wearing?: Pants     Lower body assist Assist for lower body dressing: Dependent - Patient 0%     Toileting Toileting    Toileting assist Assist for toileting: 2 Helpers     Transfers Chair/bed transfer  Transfers assist  Chair/bed transfer activity did not occur: Safety/medical concerns  Chair/bed transfer assist level: Dependent - mechanical lift     Locomotion Ambulation   Ambulation assist   Ambulation activity did not occur: Safety/medical concerns          Walk 10 feet activity   Assist  Walk 10 feet activity did not occur: Safety/medical concerns        Walk 50 feet activity   Assist Walk 50 feet with 2 turns activity did  not occur: Safety/medical concerns         Walk 150 feet activity   Assist Walk 150 feet activity did not occur: Safety/medical concerns         Walk 10 feet on uneven surface  activity   Assist Walk 10 feet on uneven surfaces activity did not occur: Safety/medical concerns         Wheelchair     Assist Will patient use wheelchair at discharge?: Yes (TBD but likely) Type of Wheelchair: Power    Wheelchair assist level: Supervision/Verbal cueing Max wheelchair distance: >100 ft    Wheelchair 50 feet with 2 turns activity    Assist    Wheelchair 50 feet with 2 turns activity did not occur: Safety/medical concerns   Assist Level:  Supervision/Verbal cueing   Wheelchair 150 feet activity     Assist  Wheelchair 150 feet activity did not occur: Safety/medical concerns       Blood pressure (!) 142/85, pulse 69, temperature 97.8 F (36.6 C), resp. rate 16, height 6' (1.829 m), weight 124.5 kg, SpO2 97 %.    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 Decadron taper -Patient may  Shower with cap -ELOS/Goals: 3-4 wks minA goals  -Continue CIR therapies including PT, OT SLP. I have discussed goals of care with wife/pt.    5/6 -I am going to initiate ritalin to help with day time arousal and initiation. Spoke with pt and wife about it and both agree with trial       -path is positive for only Grade 1 meningioma. We'll check with NS, but likely can discuss radiation (if needed) at office after discharge   2. Left posterior tibial vein DVT:  Vascular ultrasound positive for age indeterminate left posterior tibial vein.   -continue xarelto. -antiplatelet therapy: N/A 3. Post-operative pain:tylenol prn  -voltaren gel for right shoulder---will rx left shoulder as well   -work on appropriate techniques, ROM with therapy  -continue baclofen 10mg  qhs for cramps and to assist with sleep   4. Mood/sleep:  improved  -improved with scheduled trazodone 50mg ---continue  - rx pain, on baclofen too -antipsychotic agents: N/A  -generally improving-continue with trazodone and melatonin  -added ritalin to help with sleep/wake cycle---pt reports benefit. Still delayed though  5/9 increase ritalin to 10mg  5. Neuropsych: This patientiscapable of making decisions on hisown behalf. 6. Skin/Wound Care:rash resolved   -5/9 drainage from scalp incision. ?seroma. NS called.     -I see no notes or  orders    -area was not draining this morning 7. Fluids/Electrolytes/Nutrition:eating well     8. Hypertension. Lasix 20 mg daily   -Restarted home Flomax HS which will also help with BP   -no norvasc d/t swelling   5/2 lasix increased to 40mg  daily for elevated bp's   5/9 bp controlled/borderline     -continue metoprolol    -lasix at 20mg  daily   9. Hyperlipidemia. Lipitor 10. BPH: increased Flomax to 0.8mg  HS 11. Urinary frequency: mostly incontinent  -UA negative.  timed voiding q2H while awake with urinal- placed nursing order   -Flomax to 0.8mg  given continued symptoms and elevated BP 12. Leukocytosis. Resolved.  No s/s of infection   -continue to follow   - decadron  off 13. Constipation:  -continue senna-s bid  -moving bowels regularly now  +BM 5/8     LOS: 24 days  A FACE TO FACE EVALUATION WAS PERFORMED  [name] 05/26/2020, 10:49 AM

## 2020-05-26 NOTE — Progress Notes (Signed)
Occupational Therapy Session Note  Patient Details  Name: Anthony Downs MRN: [id number] Date of Birth: 04/28/1950  Today's Date: 05/26/2020 OT Individual Time: 0730-0840 OT Individual Time Calculation (min): 70 min   Session 2: OT Individual Time: 1330-1430 OT Individual Time Calculation (min): 60 min    Short Term Goals: Week 4:  OT Short Term Goal 1 (Week 4): Pt will manage trunk Sup>sit wiht MOD A consistently OT Short Term Goal 2 (Week 4): Pt will tolerate trialing toileting OOB in sara + daily OT Short Term Goal 3 (Week 4): Pt will don shirt with no more than MIN A for sitting balnace on EOB OT Short Term Goal 4 (Week 4): pt will thread 1LE into pants  Skilled Therapeutic Interventions/Progress Updates:    Pt received supine with no c/o pain. Pt agreeable to OT session. Pt completed oral care sitting up in bed with min cueing for RUE as gross stabilizer. Otherwise pt able to complete oral care with supervision. He then completed peri care with mod A. He rolled R with only min A! Pt able to voluntary activate hip flexion with increased visual attention limb, 2/5 trials. Pt completed bed mobility to EOB with max A to EOB. He was able to maintain sitting balance with CGA- supervision overall. Pt required mod HOH to bring RUE to his LUE for bathing. Increased tone and tremor in pt's RUE overall. Pt completed UB dressing with mod A, improved carryover of hemi dressing technique. Sara plus was used to assist pt in standing- 80% upright standing dependently with lift and then pt able to activate posterior chain to complete remainder of stand. Pt was transferred to his PWC. He navigated chair in room with min cueing. Pt closing eyes and required mod cueing for alertness/attention to task. TV turned on with loud volume to encourage arousal/alertness for pt to eat breakfast. Set up assist provided. PROM provided to pt's RUE as he ate. Pt left sitting in PWC.   [place] Stim One applied to pt's R  shoulder- wings activating anterior, middle, and posterior deltoid. 60 min unattended e-stim. Skin intact and no reports of pain before/after tx.  330 pulse width 35 Hz pulse rate On 8 sec/ off 8 sec Ramp up/ down 2 sec Symmetrical Biphasic wave form  Max intensity 110mA at 500 Ohm load   Session 2: Pt received supine with no c/o pain, agreeable to OT session. Drainage on pillow present from pt's head incision- MD notified, LPN also entered room to dress wound. Overall pt much more alert this session and engaging in conversation. He completed bed mobility to EOB with max +1 assist. He sat EOB with supervision, although requiring total A for scooting hips forward and placing feet firmly on the ground. Sara was used to transfer pt from sitting EOB to PWC. Tilt feature used to scoot pt's hips back in chair with gravity. Pt navigated PWC 300 ft to outside entrance of hospital. He required only min cueing and was able to maintain arousal throughout session. Edu provided re community accessibility from PWC. Pt sat outside and conversed with OT, with attempts made to address coping and emotional adjustment to meningioma, pt reluctant to delve into feelings, stating "i'm doing just fine". Pt was given assist to navigate chair back inside d/t fatigue. He was left sitting up in the PWC with all needs met, RN alerted to pt in chair with recommended use of sara to get back into bed.    Therapy Documentation Precautions:    Precautions Precautions: Fall,Other (comment) Precaution Comments: Strong BLE extensor tone, hx of R hand tremor Restrictions Weight Bearing Restrictions: No   Therapy/Group: Individual Therapy  Curtis Sites 05/26/2020, 6:29 AM

## 2020-05-26 NOTE — Progress Notes (Signed)
Physical Therapy Session Note  Patient Details  Name: Anthony Downs MRN: [id number] Date of Birth: 1950/12/01  Today's Date: 05/26/2020 PT Individual Time: 0905-1014 PT Individual Time Calculation (min): 69 min   Short Term Goals: Week 3:  PT Short Term Goal 1 (Week 3): Pt will perform bed mobility with max A +1 consistently. PT Short Term Goal 2 (Week 3): Patient will perform dynamic sitting balance at least 5 min with min A. PT Short Term Goal 3 (Week 3): Patient will perform pressure relief in power w/c indepenently. PT Short Term Goal 4 (Week 3): Patient will propel power w/c on outdoor/community surfaces >150 ft. PT Short Term Goal 5 (Week 3): Patient will perform dependent standing >8 min.  Skilled Therapeutic Interventions/Progress Updates:     Pt greeted seated in power w/c, MD present for morning rounds. Pt agreeable to PT tx. No reports of pain. Pt needed assist for managing w/c functions to transition for seat positioning to driving mode. Education provided on how to do this and cycle through functions - pt voiced understanding but then later on in the session when asked to recall functions, pt unable. Pt propelled himself in power w/c on indoor speed (0.24mph) from his room to main rehab gym. Pt needs min cues for awareness and improve alertness as he tends to fall asleep at times while driving w/c. Noted mild serosanguinous drainage from cranial incision site and surrounding edema - Charge RN notified who notified pt's RN - RN reports she's aware and will apply dressing when therapy session complete. Reminded patient to not scratch or irritate site.   Completed sit<>stand with totalA with use of Sara Plus from w/c - cues for initiation extension moment in hip and knees and cues for upright. Needs assist for stabilizing RLE into the knee block on Sara Plus to prevent for R knee abduction. Able to stand in [place] with CGA and transferred to high/low mat table dependently.  Performed mini-squats in [place], again needed assist for RLE stabilization and cues for R knee extension. Also worked on standing tolerance in [employer] - able to stand for > 5 minutes, playing games of tic-tac-to and maintaining conversations throughout. Seated rest breaks b/w activities for recovery due to fatigue.  Pt requesting to return to bed at end of session to rest. While standing in [place] with CGA for balance and +2 assist for steering, pt was transferred from dayroom therapy gym back to his room. Required +2 assist for sit>supine for BLE and trunk management. Pt remained supine in bed with HOB raised and needs within reach. Bed alarm on and pt made comfortable.   Therapy Documentation Precautions:  Precautions Precautions: Fall,Other (comment) Precaution Comments: Strong BLE extensor tone, hx of R hand tremor Restrictions Weight Bearing Restrictions: No General:    Therapy/Group: Individual Therapy  Monalisa Bayless P Minnette Merida PT 05/26/2020, 7:51 AM

## 2020-05-27 LAB — CBC
HCT: 40.1 % (ref 39.0–52.0)
Hemoglobin: 12.7 g/dL — ABNORMAL LOW (ref 13.0–17.0)
MCH: 30.3 pg (ref 26.0–34.0)
MCHC: 31.7 g/dL (ref 30.0–36.0)
MCV: 95.7 fL (ref 80.0–100.0)
Platelets: 238 10*3/uL (ref 150–400)
RBC: 4.19 MIL/uL — ABNORMAL LOW (ref 4.22–5.81)
RDW: 12.9 % (ref 11.5–15.5)
WBC: 6.8 10*3/uL (ref 4.0–10.5)
nRBC: 0 % (ref 0.0–0.2)

## 2020-05-27 LAB — BASIC METABOLIC PANEL
Anion gap: 7 (ref 5–15)
BUN: 8 mg/dL (ref 8–23)
CO2: 31 mmol/L (ref 22–32)
Calcium: 9.3 mg/dL (ref 8.9–10.3)
Chloride: 101 mmol/L (ref 98–111)
Creatinine, Ser: 0.89 mg/dL (ref 0.61–1.24)
GFR, Estimated: >60 mL/min (ref >60)
Glucose, Bld: 118 mg/dL — ABNORMAL HIGH (ref 70–99)
Potassium: 3.6 mmol/L (ref 3.5–5.1)
Sodium: 139 mmol/L (ref 135–145)

## 2020-05-27 NOTE — Progress Notes (Signed)
Physical Therapy Session Note  Patient Details  Name: Anthony Downs MRN: [id number] Date of Birth: 1950/09/28  Today's Date: 05/27/2020 PT Individual Time: 0800-0900 PT Individual Time Calculation (min): 60 min   Short Term Goals: Week 1:  PT Short Term Goal 1 (Week 1): Pt will perform R/L rolling in bed with +2 mod assist using bed features PT Short Term Goal 1 - Progress (Week 1): Partly met (roll R with mod A +1, roll L with max A +2) PT Short Term Goal 2 (Week 1): Pt will tolerate sitting EOB for at least 15 minutes during therapy PT Short Term Goal 2 - Progress (Week 1): Progressing toward goal PT Short Term Goal 3 (Week 1): Pt will maintain dynamic sitting balance EOM with max assist for at least 3 minutes PT Short Term Goal 3 - Progress (Week 1): Progressing toward goal PT Short Term Goal 4 (Week 1): Pt will perform supine<>sitting with +2 max assist PT Short Term Goal 4 - Progress (Week 1): Met PT Short Term Goal 5 (Week 1): Pt will tolerate sitting EOB for at least 15 minutes during therapy Week 2:  PT Short Term Goal 1 (Week 2): Pt will maintain dynamic sitting balance EOM with max assist for at least 3 minutes PT Short Term Goal 1 - Progress (Week 2): Met PT Short Term Goal 2 (Week 2): Pt will perform bed mobility with max A +1 consistently. PT Short Term Goal 2 - Progress (Week 2): Progressing toward goal PT Short Term Goal 3 (Week 2): Patient will tolerate dependent lower extremity weight bearing >5 min using LRAD. PT Short Term Goal 3 - Progress (Week 2): Met PT Short Term Goal 4 (Week 2): Patient will initiate power mobility. PT Short Term Goal 4 - Progress (Week 2): Met Week 3:  PT Short Term Goal 1 (Week 3): Pt will perform bed mobility with max A +1 consistently. PT Short Term Goal 2 (Week 3): Patient will perform dynamic sitting balance at least 5 min with min A. PT Short Term Goal 3 (Week 3): Patient will perform pressure relief in power w/c indepenently. PT  Short Term Goal 4 (Week 3): Patient will propel power w/c on outdoor/community surfaces >150 ft. PT Short Term Goal 5 (Week 3): Patient will perform dependent standing >8 min. Week 4:     Skilled Therapeutic Interventions/Progress Updates:    pt received in bed and agreeable to therapy. Wife present. Pt directed in donning shorts total A, B socks and TED hose total A and shirt max A. Pt then directed in supine>sit max A for BLE management and trunk assist to sitting and then positioning of BLE in sitting. Pt then directed in static sitting for clothing management/adjustments at min A-CGA and setup for sara plus. Pt then directed in x5 total Sit to stand with use of sara plus to come to ~75% upright then with tactile cues and VC pt able to bring self into full standing with fair glute and quad contractions. Pt able to stand in good upright position for limited time then required return to sitting EOB. Pt then transferred to [facility] with sara plus and required max A for repositioning in [place]. Pt then directed in [place] mobility SBA with intermittent VC for redirection 200' then prolonged rest break. Pt then directed in 200' back to room with pt demonstrated greatly increased fatigue at end of session and x4 fell asleep during PWC mobility back to room requiring PT to wake pt to  continue. Pt then required max A to position chair in room. Pt left in room, wife present, All needs in reach and in good condition. Call light in hand.    Session 2:  Therapy Documentation Precautions:  Precautions Precautions: Fall,Other (comment) Precaution Comments: Strong BLE extensor tone, hx of R hand tremor Restrictions Weight Bearing Restrictions: No General:   Vital Signs:  Pain:   Mobility:   Locomotion :    Trunk/Postural Assessment :    Balance:   Exercises:   Other Treatments:      Therapy/Group: Individual Therapy  Junie Panning 05/27/2020, 1:11 PM

## 2020-05-27 NOTE — Progress Notes (Addendum)
PROGRESS NOTE   Subjective/Complaints:  Pt up in power chair with therapy. No new issues this morning. Initiating more with therapy. Still is a heavy transfer though. Scalp incision still draining  ROS: Patient denies fever, rash, sore throat, blurred vision, nausea, vomiting, diarrhea, cough, shortness of breath or chest pain, headache, or mood change.   Objective:   No results found. Recent Labs    05/27/20 0459  WBC 6.8  HGB 12.7*  HCT 40.1  PLT 238   Recent Labs    05/27/20 0459  NA 139  K 3.6  CL 101  CO2 31  GLUCOSE 118*  BUN 8  CREATININE 0.89  CALCIUM 9.3    Intake/Output Summary (Last 24 hours) at 05/27/2020 1054 Last data filed at 05/27/2020 0800 Gross per 24 hour  Intake 840 ml  Output --  Net 840 ml        Physical Exam: Vital Signs Blood pressure (!) 142/73, pulse 66, temperature (!) 97.5 F (36.4 C), resp. rate 18, height 6' (1.829 m), weight 124.5 kg, SpO2 96 %.     Constitutional: No distress . Vital signs reviewed. HEENT: EOMI, oral membranes moist Neck: supple Cardiovascular: RRR without murmur. No JVD    Respiratory/Chest: CTA Bilaterally without wheezes or rales. Normal effort    GI/Abdomen: BS +, non-tender, non-distended Ext: no clubbing, cyanosis, or edema Psych: cooperative Skin: scalp incision clean, no drainage seen today  Neurologic: remains apraxic, more attentive.  Tremor in RUE more noticeable. Cranial nerves II through XII intact, motor strength is 5/5 in Left and 0/5 right deltoid, bicep, grip 1/5. Trace at best/5 right hip flexor, knee extensors, 1+ left hip flexor, KE--no changes. 0/5 ankle dorsiflexor and plantar flexor bilaterally. Continued extensor tone present.  Musculoskeletal: mild bilateral shoulder discomfort with ROM      Assessment/Plan: 1. Functional deficits which require 3+ hours per day of interdisciplinary therapy in a comprehensive inpatient rehab  setting.  Physiatrist is providing close team supervision and 24 hour management of active medical problems listed below.  Physiatrist and rehab team continue to assess barriers to discharge/monitor patient progress toward functional and medical goals  Care Tool:  Bathing    Body parts bathed by patient: Left arm,Chest,Abdomen,Front perineal area,Face   Body parts bathed by helper: Right arm,Buttocks,Right upper leg,Left upper leg,Right lower leg,Left lower leg     Bathing assist Assist Level: Maximal Assistance - Patient 24 - 49%     Upper Body Dressing/Undressing Upper body dressing   What is the patient wearing?: Pull over shirt    Upper body assist Assist Level: Moderate Assistance - Patient 50 - 74%    Lower Body Dressing/Undressing Lower body dressing      What is the patient wearing?: Pants     Lower body assist Assist for lower body dressing: Dependent - Patient 0%     Toileting Toileting    Toileting assist Assist for toileting: 2 Helpers     Transfers Chair/bed transfer  Transfers assist  Chair/bed transfer activity did not occur: Safety/medical concerns  Chair/bed transfer assist level: Dependent - mechanical lift     Locomotion Ambulation   Ambulation assist   Ambulation  activity did not occur: Safety/medical concerns          Walk 10 feet activity   Assist  Walk 10 feet activity did not occur: Safety/medical concerns        Walk 50 feet activity   Assist Walk 50 feet with 2 turns activity did not occur: Safety/medical concerns         Walk 150 feet activity   Assist Walk 150 feet activity did not occur: Safety/medical concerns         Walk 10 feet on uneven surface  activity   Assist Walk 10 feet on uneven surfaces activity did not occur: Safety/medical concerns         Wheelchair     Assist Will patient use wheelchair at discharge?: Yes (TBD but likely) Type of Wheelchair: Power    Wheelchair  assist level: Supervision/Verbal cueing Max wheelchair distance: >100 ft    Wheelchair 50 feet with 2 turns activity    Assist    Wheelchair 50 feet with 2 turns activity did not occur: Safety/medical concerns   Assist Level: Supervision/Verbal cueing   Wheelchair 150 feet activity     Assist  Wheelchair 150 feet activity did not occur: Safety/medical concerns       Blood pressure (!) 142/73, pulse 66, temperature (!) 97.5 F (36.4 C), resp. rate 18, height 6' (1.829 m), weight 124.5 kg, SpO2 96 %.    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   -no showering for now until scalp drainage resolves -ELOS/Goals:5/23, may need SNF, family ed this week  -Continue CIR therapies including PT, OT, and SLP    -path is positive for only Grade 1 meningioma.     2. Left posterior tibial vein DVT:  Vascular ultrasound positive for age indeterminate left posterior tibial vein.   -continue xarelto. -antiplatelet therapy: N/A 3. Post-operative pain:tylenol prn  -voltaren gel for right shoulder---will rx left shoulder as well   -work on appropriate techniques, ROM with therapy  -continue baclofen 10mg  qhs for cramps and to assist with sleep   4. Mood/sleep:  improved  -improved with scheduled trazodone 50mg ---continue  - rx pain, on baclofen too -antipsychotic agents: N/A  -generally improving-continue with trazodone and melatonin  -added ritalin to help with sleep/wake cycle---pt reports benefit. Still delayed though  5/9 increased ritalin to 10mg  5. Neuropsych: This patientiscapable of making decisions on hisown behalf. 6. Skin/Wound Care:rash resolved   -5/10 ongoing drainage from scalp incision. ?seroma.      -spoke to NS yesterday who will follow up 7.  Fluids/Electrolytes/Nutrition:eating well     -Potassium level borderline---supplement 8. Hypertension. Lasix 20 mg daily   -Restarted home Flomax HS which will also help with BP   -no norvasc d/t swelling   5/2 lasix increased to 40mg  daily for elevated bp's   5/10 bp controlled/borderline     -continue metoprolol    -lasix at 20mg  daily   9. Hyperlipidemia. Lipitor 10. BPH: increased Flomax to 0.8mg  HS 11. Urinary frequency: mostly incontinent  -UA negative.  timed voiding q2H while awake with urinal- placed nursing order   -Flomax to 0.8mg  given continued symptoms and elevated BP 12. Leukocytosis. Resolved off decadron 13. Constipation:  -continue senna-s bid  -moving bowels regularly now  +BM 5/9     LOS: 25 days A FACE TO FACE EVALUATION WAS PERFORMED  [name]  05/27/2020, 10:54 AM

## 2020-05-27 NOTE — Progress Notes (Signed)
Patient ID: Anthony Downs, male   DOB: [birth year], 70 y.o.   MRN: [id number]  SW met with pt and pt wife in room to provide updates from team conference, and d/c date 5/23. Discussed home with [place] vs SNF. Both are unsure on SNF being best option, however, pt is amenable to placement. Preferred SNFs : 1) [facility], 2) [facility], and 3) [facility]. Both understand that if a SNF is able to accept, pt will leave before current d/c date.  Loralee Pacas, MSW, [place] Office: [phone] Cell: [phone] Fax: [phone]

## 2020-05-27 NOTE — Progress Notes (Signed)
Occupational Therapy Session Note  Patient Details  Name: Anthony Downs MRN: [id number] Date of Birth: 07/20/1950  Today's Date: 05/27/2020 OT Individual Time: 0915-0956 OT Individual Time Calculation (min): 41 min   Session 2: OT Individual Time: 1400-1500 OT Individual Time Calculation (min): 60 min    Short Term Goals: Week 4:  OT Short Term Goal 1 (Week 4): Pt will manage trunk Sup>sit wiht MOD A consistently OT Short Term Goal 2 (Week 4): Pt will tolerate trialing toileting OOB in sara + daily OT Short Term Goal 3 (Week 4): Pt will don shirt with no more than MIN A for sitting balnace on EOB OT Short Term Goal 4 (Week 4): pt will thread 1LE into pants  Skilled Therapeutic Interventions/Progress Updates:    Pt received sitting up in PWC with his wife present. Pt with no c/o pain. Pt navigated PWC to the therapy gym at very slow pace but supervision overall. Sara plus used to assist pt into standing. Once at ~85% of standing pt was able to activate posterior chain and bring his hips forward to actively finish stand. He stood for several minutes to provide BLE weightbearing for bone density, BLE NMR, as well as for RUE weightbearing through elbow. Pt was lowered to the mat where he sat unsupported but with R lean. Pt required min cueing/assist to obtain midline orientation but then was able to maintain this positioning for several minutes. He completed dynamic sitting balance activity with reaching outside of his BOS L and then to the R with his RUE in facilitated full extension weightbearing with mod facilitation. Pt returned to the PWC via sara. He required assist to navigate w/c back to room d/t fatigue. He was left sitting in the PWC with all needs met, wife present.   [place] Stim One applied to pt's R shoulder to approximate joint and encourage NMR. 60 min unattended e-stim with no adverse skin reactions or pain reported.  330 pulse width 35 Hz pulse rate On 8 sec/ off 8  sec Ramp up/ down 2 sec Symmetrical Biphasic wave form  Max intensity 110mA at 500 Ohm load   Session 2: Pt received supine with no c/o pain. Discussed d/c planning, goals set, external aid that will be needed in the home, and equipment possibly needed. Pt transferred to EOB with max A. A stedy was obtained and trialed. Pt able to stand in stedy with mod A +2. He remained standing for 2 minutes with min A. His RUE was able to maintain grasp on the stedy bar without assist as well. Pt with difficulty activating B quads during descent to perched sitting and to bed after several more trials. Decided to trial stand with RW d/t incredible progress. With pt's wife stabilizing RW pt was able to stand with max +2 assist. Once standing pt unable to weightshift over to RLE, with heavy push toward R from the LLE. Max +2 to remain standing, but he was able to stand for 30-60 seconds. Max +2 and heavy cueing for eccentric control during descent to bed. Pt repeated this twice more, for 3 sit <> stands with. This is incredible progress for pt. Now quite fatigued he returned to supine in bed with all needs met, bed alarm set.    Therapy Documentation Precautions:  Precautions Precautions: Fall,Other (comment) Precaution Comments: Strong BLE extensor tone, hx of R hand tremor Restrictions Weight Bearing Restrictions: No  Therapy/Group: Individual Therapy   H  05/27/2020, 6:30 AM 

## 2020-05-27 NOTE — Progress Notes (Signed)
Physical Therapy Session Note  Patient Details  Name: Anthony Downs MRN: [id number] Date of Birth: 1950-09-23  Today's Date: 05/27/2020 PT Individual Time: 1100-1130 PT Individual Time Calculation (min): 30 min   Short Term Goals: Week 1:  PT Short Term Goal 1 (Week 1): Pt will perform R/L rolling in bed with +2 mod assist using bed features PT Short Term Goal 1 - Progress (Week 1): Partly met (roll R with mod A +1, roll L with max A +2) PT Short Term Goal 2 (Week 1): Pt will tolerate sitting EOB for at least 15 minutes during therapy PT Short Term Goal 2 - Progress (Week 1): Progressing toward goal PT Short Term Goal 3 (Week 1): Pt will maintain dynamic sitting balance EOM with max assist for at least 3 minutes PT Short Term Goal 3 - Progress (Week 1): Progressing toward goal PT Short Term Goal 4 (Week 1): Pt will perform supine<>sitting with +2 max assist PT Short Term Goal 4 - Progress (Week 1): Met PT Short Term Goal 5 (Week 1): Pt will tolerate sitting EOB for at least 15 minutes during therapy Week 2:  PT Short Term Goal 1 (Week 2): Pt will maintain dynamic sitting balance EOM with max assist for at least 3 minutes PT Short Term Goal 1 - Progress (Week 2): Met PT Short Term Goal 2 (Week 2): Pt will perform bed mobility with max A +1 consistently. PT Short Term Goal 2 - Progress (Week 2): Progressing toward goal PT Short Term Goal 3 (Week 2): Patient will tolerate dependent lower extremity weight bearing >5 min using LRAD. PT Short Term Goal 3 - Progress (Week 2): Met PT Short Term Goal 4 (Week 2): Patient will initiate power mobility. PT Short Term Goal 4 - Progress (Week 2): Met Week 3:  PT Short Term Goal 1 (Week 3): Pt will perform bed mobility with max A +1 consistently. PT Short Term Goal 2 (Week 3): Patient will perform dynamic sitting balance at least 5 min with min A. PT Short Term Goal 3 (Week 3): Patient will perform pressure relief in power w/c indepenently. PT  Short Term Goal 4 (Week 3): Patient will propel power w/c on outdoor/community surfaces >150 ft. PT Short Term Goal 5 (Week 3): Patient will perform dependent standing >8 min.  Skilled Therapeutic Interventions/Progress Updates:    pt received in [place] and agreeable to therapy. Pt directed in setup with sara plus for transfer training x2 with tactile and VC for increased muscle activation at glutes and quads for increased strength and I with transfers with sara to assist in 75% upright and pt able to complete remainder of 25% stance. Pt fatigued quickly and requested to rest in bed at end of session, sara plus used to complete this transfer dependently. Pt then directed in sit>supine max A for BLE placement and light assist for trunk support. Pt required max A for repositioning and pillow placement for comfort. Pt left in bed, All needs in reach and in good condition. Call light in hand.  Alarm set. Pt tolerated sitting in PWC for 3 hours this AM prior to laying back down in bed.   Therapy Documentation Precautions:  Precautions Precautions: Fall,Other (comment) Precaution Comments: Strong BLE extensor tone, hx of R hand tremor Restrictions Weight Bearing Restrictions: No General:   Vital Signs: Therapy Vitals Temp: 97.8 F (36.6 C) Pulse Rate: 61 Resp: 18 BP: (!) 144/65 Patient Position (if appropriate): Lying Oxygen Therapy SpO2: 98 % O2  Device: Room Air Pain:   Mobility:   Locomotion :    Trunk/Postural Assessment :    Balance:   Exercises:   Other Treatments:      Therapy/Group: Individual Therapy   S  05/27/2020, 3:53 PM  

## 2020-05-27 NOTE — Patient Care Conference (Signed)
Inpatient RehabilitationTeam Conference and Plan of Care Update Date: 05/27/2020   Time: 10:09 AM    Patient Name: Anthony Downs      Medical Record Number: [id number]  Date of Birth: September 19, 1950 Sex: Male         Room/Bed: 4W18C/4W18C-01 Payor Info: Payor: MEDICARE / Plan: MEDICARE PART A AND B / Product Type: *No Product type* /    Admit Date/Time:  05/02/2020  5:12 PM  Primary Diagnosis:  Brain tumor [facility])  Hospital Problems: Principal Problem:   Brain tumor [facility]) Active Problems:   Meningioma [facility])    Expected Discharge Date: Expected Discharge Date: 06/09/20  Team Members Present: Physician leading conference: Dr. Alger Simons Care Coodinator Present: Loralee Pacas, LCSWA;Khani Paino Creig Hines, RN, BSN, CRRN Nurse Present: Dorthula Nettles, RN PT Present: Tereasa Coop, PT OT Present: Laverle Hobby, OT SLP Present: Weston Anna, SLP PPS Coordinator present : Gunnar Fusi, SLP     Current Status/Progress Goal Weekly Team Focus  Bowel/Bladder   Incontinent of B/B. LBM 05/26/2020  Pt gains normal function of Bowel and Bladder  Timed toileitng Q2h   Swallow/Nutrition/ Hydration             ADL's   RUE with more tone at the elbow, improved hand grasp. LLE has volitional hip and knee flexion that when activated greatly improves bed mobility- rolling R with min A, L still with mod A. SUpervision sitting balance. Mod A UB ADLs, total A LB  Mod-max A overall  ADL retraining, d/c planning, RUE NMR, bed mobility   Mobility   maxA +2 for bed mobility, supervision unsupported sitting balance, dependent standing in [place] - improved facilitation of initiating posterior chain and completing "mini-squats" in [place]. Supervision for power wheelchair mobility  Mod A overall, removed gait goals, added power mobility goals  Activity tolerance, standing tolerance/balance, functional transfers, pt and caregiver eduction, NMR of extremities   Communication              Safety/Cognition/ Behavioral Observations            Pain   No c/o pain  Pt remains pain free  Assess Qshift and PRN   Skin   Minimal drainage from craniotomy site, Dressing in place. Rash on back.  Pt skin is crean dry and intact with no S/S of infection or breakdown  Assess Qshift and PRN     Discharge Planning:  Pt to d/c to home with 24/7 care from wife. Discussion on possible placement has been discussed. Fam edu on 5/11 10am-3pm to help determine care needs.   Team Discussion: Drainage from posterior incision site. Started Ritalin last week, has a Grade 1 tumor. Incontinent B/B, can have continent episodes. Wife coming in for family education tomorrow. Patient on target to meet rehab goals: RUE has more tone in the elbow, has improved hand grasp. LLE has volitional hip and knee flexion, when activated helps with bed mobility. Bed mobility becomes min assist when knee is able to bend. Max +2 when knee doesn't bend, dependent +2 with the [employer]. Supervision with the power wheelchair. SLP discharged patient to give him more time to work on mobility.  *See Care Plan and progress notes for long and short-term goals.   Revisions to Treatment Plan:  Not at this time.  Teaching Needs: Family education, medication management, pain management, skin/wound care, transfer training, gait training, balance training, endurance training, safety awareness.  Current Barriers to Discharge: Decreased caregiver support, Medical stability, Home  enviroment access/layout, Incontinence, Wound care, Lack of/limited family support, Weight, Medication compliance, Pending chemo/radiation, Behavior and Nutritional means  Possible Resolutions to Barriers: Continue current medications, provide emotional support.     Medical Summary Current Status: drainage from incision, ?seroma.  eating well. ritalin started to boost initiation and day time activity tolerance.  Barriers to Discharge: Medical stability    Possible Resolutions to Barriers/Weekly Focus: daily assessment of labs, maximize ROM, initiation. NS follow up wound   Continued Need for Acute Rehabilitation Level of Care: The patient requires daily medical management by a physician with specialized training in physical medicine and rehabilitation for the following reasons: Direction of a multidisciplinary physical rehabilitation program to maximize functional independence : Yes Medical management of patient stability for increased activity during participation in an intensive rehabilitation regime.: Yes Analysis of laboratory values and/or radiology reports with any subsequent need for medication adjustment and/or medical intervention. : Yes   I attest that I was present, lead the team conference, and concur with the assessment and plan of the team.   Cristi Loron 05/27/2020, 12:23 PM

## 2020-05-28 NOTE — Progress Notes (Signed)
Physical Therapy Session Note  Patient Details  Name: Anthony Downs MRN: [id number] Date of Birth: [birth year]  Today's Date: 05/28/2020 PT Individual Time: 0800-0827 PT Individual Time Calculation (min): 27 min   Short Term Goals: Week 3:  PT Short Term Goal 1 (Week 3): Pt will perform bed mobility with max A +1 consistently. PT Short Term Goal 2 (Week 3): Patient will perform dynamic sitting balance at least 5 min with min A. PT Short Term Goal 3 (Week 3): Patient will perform pressure relief in power w/c indepenently. PT Short Term Goal 4 (Week 3): Patient will propel power w/c on outdoor/community surfaces >150 ft. PT Short Term Goal 5 (Week 3): Patient will perform dependent standing >8 min.  Skilled Therapeutic Interventions/Progress Updates:    Patient received supine in bed, agreeable to PT. He denies pain. PT donning TED hose + gripper socks TotalA. He was able to come sit edge of bed with MaxA x2 with verbal cues for sequencing. Static sitting balance at edge of bed with consistent MinA, but no LOB noted. [name] used to dependently stand and transfer to [facility]. Patient requiring TotalA to reposition in wc. Morning ADLs completed seated at sink with set up assist. Patient remaining up in wc, seatbelt on, call light within reach, wife at bedside.   Therapy Documentation Precautions:  Precautions Precautions: Fall,Other (comment) Precaution Comments: Strong BLE extensor tone, hx of R hand tremor Restrictions Weight Bearing Restrictions: No    Therapy/Group: Individual Therapy  Karoline Caldwell, PT, DPT, CBIS  05/28/2020, 7:49 AM

## 2020-05-28 NOTE — Progress Notes (Signed)
Occupational Therapy Session Note  Patient Details  Name: Anthony Downs MRN: [id number] Date of Birth: Jun 15, 1950  Today's Date: 05/28/2020 OT Individual Time: 1100-1200 OT Individual Time Calculation (min): 60 min    Short Term Goals: Week 1:  OT Short Term Goal 1 (Week 1): Pt will roll with MAX A of 1 caregiver to decrease BOC for toileitng OT Short Term Goal 1 - Progress (Week 1): Met OT Short Term Goal 2 (Week 1): Pt will tolerate laying sidelying on R 1x per day to assist with tone management OT Short Term Goal 2 - Progress (Week 1): Met OT Short Term Goal 3 (Week 1): Pt will recall hemi dressing techniques with MOD VC OT Short Term Goal 3 - Progress (Week 1): Progressing toward goal OT Short Term Goal 4 (Week 1): Pt will sit EOB 10 min with mod A in prep for ADL OT Short Term Goal 4 - Progress (Week 1): Met  Skilled Therapeutic Interventions/Progress Updates:    1:1 pt received in bed agreeable to OT with handoff from PT in [place]. Initially pt, family and OT discuss CLOF, perusing SNF placement, equipment and current barriers. Pt shaves partially at sink and OT finishes. Pt drives chair to tx gym with mod VC at end of drive for arousal while still driving. Pt attempts UE ranger for gravity eliminated position. Pt limited by arousal/attention/visual fixation. Pt returned to bed and saebo applied to R deltoid. With skin in tact at end of 60 min session.   Saebo Stim One 330 pulse width 35 Hz pulse rate On 8 sec/ off 8 sec Ramp up/ down 2 sec Symmetrical Biphasic wave form  Max intensity 112mA at 500 Ohm load  Exited session with pt seated in bed, exit alarm on and call light in reach    Therapy Documentation Precautions:  Precautions Precautions: Fall,Other (comment) Precaution Comments: Strong BLE extensor tone, hx of R hand tremor Restrictions Weight Bearing Restrictions: No General:   Vital Signs: Therapy Vitals Temp: 97.8 F (36.6 C) Temp Source: Oral Pulse  Rate: 65 Resp: 20 BP: (!) 152/88 Patient Position (if appropriate): Lying Oxygen Therapy SpO2: 97 % O2 Device: Room Air Pain:   ADL: ADL Grooming: Moderate assistance (per pt report) Where Assessed-Grooming: Bed level Upper Body Bathing: Moderate assistance Where Assessed-Upper Body Bathing: Bed level Lower Body Bathing: Dependent (+2 rolling) Where Assessed-Lower Body Bathing: Bed level Upper Body Dressing: Dependent (+2 for rolling to pull down back) Where Assessed-Upper Body Dressing: Bed level Lower Body Dressing: Dependent Where Assessed-Lower Body Dressing: Bed level Toileting: Unable to assess Vision   Perception    Praxis   Exercises:   Other Treatments:     Therapy/Group: Individual Therapy  [name] 05/28/2020, 6:53 AM

## 2020-05-28 NOTE — NC FL2 (Signed)
[place] MEDICAID FL2 LEVEL OF CARE SCREENING TOOL     IDENTIFICATION  Patient Name: Anthony Downs Birthdate: 1950/05/04 Sex: male Admission Date (Current Location): 05/02/2020  [facility] and [place] Number:  Herbalist and Address:  [facility], [place] [address] [place], [place] [address]      Provider Number: [id number]  Attending Physician Name and Address:  [name], MD  Relative Name and Phone Number:  Christpoher Sievers #[phone]    Current Level of Care: Hospital Recommended Level of Care: [place] Prior Approval Number:    Date Approved/Denied:   PASRR Number: [id number]  Discharge Plan: SNF    Current Diagnoses: Patient Active Problem List   Diagnosis Date Noted  . Meningioma ([place]) 05/02/2020  . Brain tumor ([place]) 04/28/2020  . S/P nasal septoplasty 03/19/2020  . Special screening for malignant neoplasms, colon 01/09/2018    Orientation RESPIRATION BLADDER Height & Weight     Self,Time,Situation,Place  Normal Continent (some incontinent episodes) Weight: 274 lb 7.6 oz (124.5 kg) Height:  6' (182.9 cm)  BEHAVIORAL SYMPTOMS/MOOD NEUROLOGICAL BOWEL NUTRITION STATUS      Continent (some incontinent episodes)    AMBULATORY STATUS COMMUNICATION OF NEEDS Skin   Total Care Verbally Normal                       Personal Care Assistance Level of Assistance  Bathing,Dressing Bathing Assistance: Maximum assistance   Dressing Assistance: Maximum assistance     Functional Limitations Info             SPECIAL CARE FACTORS FREQUENCY  PT (By licensed PT),OT (By licensed OT),Speech therapy     PT Frequency: 5xs per week OT Frequency: 5xs per week     Speech Therapy Frequency: 5xs per week      Contractures Contractures Info: Not present    Additional Factors Info  Code Status Code Status Info: Full             Current Medications (05/28/2020):  This is the current hospital  active medication list Current Facility-Administered Medications  Medication Dose Route Frequency Provider Last Rate Last Admin  . acetaminophen (TYLENOL) tablet 650 mg  650 mg Oral Q4H PRN Cathlyn Parsons, PA-C   650 mg at 05/19/20 1713   Or  . acetaminophen (TYLENOL) suppository 650 mg  650 mg Rectal Q4H PRN Angiulli, Lavon Paganini, PA-C      . atorvastatin (LIPITOR) tablet 10 mg  10 mg Oral QHS AngiulliLavon Paganini, PA-C   10 mg at 05/27/20 2035  . baclofen (LIORESAL) tablet 10 mg  10 mg Oral QHS [name], MD   10 mg at 05/27/20 2035  . bisacodyl (DULCOLAX) suppository 10 mg  10 mg Rectal Daily PRN Angiulli, Lavon Paganini, PA-C      . diclofenac Sodium (VOLTAREN) 1 % topical gel 2 g  2 g Topical QID Raulkar, Clide Deutscher, MD   2 g at 05/28/20 0728  . furosemide (LASIX) tablet 20 mg  20 mg Oral Daily [name], MD   20 mg at 05/28/20 2878  . hydrocortisone cream 1 %   Topical TID Cathlyn Parsons, PA-C   Given at 05/28/20 6767  . loratadine (CLARITIN) tablet 10 mg  10 mg Oral Daily PRN [name], MD      . melatonin tablet 3 mg  3 mg Oral QHS Raulkar, Clide Deutscher, MD   3  mg at 05/27/20 2035  . methylphenidate (RITALIN) tablet 10 mg  10 mg Oral BID WC [name], MD   10 mg at 05/28/20 [phone]  . metoprolol tartrate (LOPRESSOR) tablet 12.5 mg  12.5 mg Oral BID [name], MD   12.5 mg at 05/28/20 5449  . ondansetron (ZOFRAN) tablet 4 mg  4 mg Oral Q4H PRN Angiulli, Lavon Paganini, PA-C       Or  . ondansetron [facility]) injection 4 mg  4 mg Intravenous Q4H PRN Angiulli, Lavon Paganini, PA-C      . pantoprazole (PROTONIX) EC tablet 40 mg  40 mg Oral Daily Cathlyn Parsons, PA-C   40 mg at 05/28/20 2010  . polyethylene glycol (MIRALAX / GLYCOLAX) packet 17 g  17 g Oral BID PRN Cathlyn Parsons, PA-C   17 g at 05/06/20 1008  . polyvinyl alcohol (LIQUIFILM TEARS) 1.4 % ophthalmic solution 1 drop  1 drop Both Eyes Daily PRN Angiulli, Lavon Paganini, PA-C      . rivaroxaban (XARELTO) tablet  20 mg  20 mg Oral Q supper [name], MD   20 mg at 05/27/20 1759  . senna-docusate (Senokot-S) tablet 2 tablet  2 tablet Oral BID [name], MD   2 tablet at 05/28/20 [phone]  . sorbitol 70 % solution 30 mL  30 mL Oral Daily PRN AngiulliLavon Paganini, PA-C   30 mL at 05/06/20 1200  . tamsulosin (FLOMAX) capsule 0.8 mg  0.8 mg Oral QPC supper Raulkar, Clide Deutscher, MD   0.8 mg at 05/27/20 1759  . traZODone (DESYREL) tablet 50 mg  50 mg Oral QHS [name], MD   50 mg at 05/27/20 2035     Discharge Medications: Please see discharge summary for a list of discharge medications.  Relevant Imaging Results:  Relevant Lab Results:   Additional Information RFX#[id number]  Rana Snare, LCSW

## 2020-05-28 NOTE — Progress Notes (Signed)
Occupational Therapy Session Note  Patient Details  Name: Anthony Downs MRN: [id number] Date of Birth: 19-Oct-1950  Today's Date: 05/28/2020 OT Individual Time: 1334-1430 OT Individual Time Calculation (min): 56 min    Short Term Goals: Week 4:  OT Short Term Goal 1 (Week 4): Pt will manage trunk Sup>sit wiht MOD A consistently OT Short Term Goal 2 (Week 4): Pt will tolerate trialing toileting OOB in sara + daily OT Short Term Goal 3 (Week 4): Pt will don shirt with no more than MIN A for sitting balnace on EOB OT Short Term Goal 4 (Week 4): pt will thread 1LE into pants  Skilled Therapeutic Interventions/Progress Updates:    Pt received supine with no c/o pain, agreeable to OT session. Pt completed bed mobility to mod A- great improvement in pushing off LUE to get to EOB. Pt sat EOB with supervision once his hips dependently scooted forward. He used the stedy with mod +2 assist to stand. Once standing he was able to maintain stand with RLE supported only (LLE not on knee plate) for 2 minutes with min A. He returned to sitting with mod facilitation to activate controlled descent. Pt then used RW and mod +2 assist to stand from EOB. He required heavy facilitation at the RLE to remain standing and promote more B weightbearing. He completed 2x repetitions in similar fashion. Pt now fatigued and requested to return to bed. Max A to return to supine. From supine pt completed RUE NMR with saebo applied to pt's R triceps while he was guided through [facility] elbow flexion/extension. Pt required mod cueing for visual attention to RUE. Pt then completed overhead extension press with slideboard as visual cue and for graded resistance. Pt was left supine with all needs met, bed alarm set. Wife present.   Saebo Stim One 330 pulse width 35 Hz pulse rate On 8 sec/ off 8 sec Ramp up/ down 2 sec Symmetrical Biphasic wave form  Max intensity 128mA at 500 Ohm load   Therapy Documentation Precautions:   Precautions Precautions: Fall,Other (comment) Precaution Comments: Strong BLE extensor tone, hx of R hand tremor Restrictions Weight Bearing Restrictions: No  Therapy/Group: Individual Therapy  Curtis Sites 05/28/2020, 6:25 AM

## 2020-05-28 NOTE — Progress Notes (Signed)
Physical Therapy Weekly Progress Note  Patient Details  Name: Anthony Downs MRN: [id number] Date of Birth: 1950/09/18  Beginning of progress report period: May 20, 2020 End of progress report period: May 28, 2020  Today's Date: 05/28/2020 PT Individual Time: 1003-1100 PT Individual Time Calculation (min): 57 min   Patient has met 4 of 5 short term goals.  Patient with great progress this week with improved volitional movement of L lower extremity equal to at least 3/5 strength throughout and intermittent proximal muscle activation of R lower extremity. Currently requires min A to roll L with bed rail and max A to roll R, max A for supine<>sit, dependent transfers with [name] or max-total A SBT, sitting tolerance in power w/c 2-4 hours, and standing tolerance up to 8 min in [name].  Patient continues to demonstrate the following deficits muscle weakness and muscle joint tightness, decreased cardiorespiratoy endurance, abnormal tone, unbalanced muscle activation, decreased coordination and decreased motor planning and decreased sitting balance, decreased standing balance, decreased postural control, hemiplegia and decreased balance strategies and therefore will continue to benefit from skilled PT intervention to increase functional independence with mobility.  Patient progressing toward long term goals..  Plan of care revisions: Patient will benefit from 2 more weeks of high intensity PT to achieve highest level of functional mobility prior to d/c to SNF to reduce caregiver burden and optimize motor recovery .  PT Short Term Goals Week 3:  PT Short Term Goal 1 (Week 3): Pt will perform bed mobility with max A +1 consistently. PT Short Term Goal 1 - Progress (Week 3): Met PT Short Term Goal 2 (Week 3): Patient will perform dynamic sitting balance at least 5 min with min A. PT Short Term Goal 2 - Progress (Week 3): Met PT Short Term Goal 3 (Week 3): Patient will perform pressure relief  in power w/c indepenently. PT Short Term Goal 3 - Progress (Week 3): Met PT Short Term Goal 4 (Week 3): Patient will propel power w/c on outdoor/community surfaces >150 ft. PT Short Term Goal 4 - Progress (Week 3): Progressing toward goal PT Short Term Goal 5 (Week 3): Patient will perform dependent standing >8 min. PT Short Term Goal 5 - Progress (Week 3): Met Week 4:  PT Short Term Goal 1 (Week 4): Patient will initiate dependent gait. PT Short Term Goal 2 (Week 4): Patient will perform sit to/from stand with max A of 1 person using LRAD. PT Short Term Goal 3 (Week 4): Patient will perform level transfers with mod A using LRAD.  Skilled Therapeutic Interventions/Progress Updates:     Patient in power w/c wit his wife in the room upon PT arrival. Patient alert and agreeable to PT session. Patient denied pain during session.  Therapeutic Activity: Bed Mobility: Patient performed supine to/from sit with max A for lower extremity management and initiation of pushing up to sitting. Provided verbal cues for log roll technique for improved leverage of L arm to push up to sitting and bringing knees to chest to bring legs on/off the bed. Transfers: Patient performed a slide board transfer power w/c>bed with max A progressing to total A due to decreased motor planning and lower extremity activation and total A for board placement. Provided cues for hand placement, board placement, and head-hips relationship for proper technique and decreased assist with transfers. Patient performed sit to/from stand x1 with total A in the [name] without the sling to increased patient motor activation and control. He  also performed a dependent transfer in [name] bed>power w/c with sling in place for patient safety. Performed scooting back in the chair with max A +2.  Neuromuscular Re-ed: Patient performed the following activities: -standing tolerance in [name] without sling for increased lower extremity and trunk  motor control, focused on R gluteal, quad, and lateral trunk activation, progressing to R upper extremity support only x8 min -sitting balance EOB >5 min with supervision focused on midline orientation and R trunk elongation   Discussed d/c planning and current caregiver burden throughout session. Patient's wife performed rolling, peri-care and lower body clothing management wit patient at bed level while providing min-max A for mobility without PT's assistance. Provided cues on proper body mechanics to assist patient and strategies to reduce extensor tone activation and breaking extensor tone in lying. Discussed use of hoyer lift versus Stedy or slide board and use of power mobility and probable need for +2 assist during transfers for safety if patient to d/c home from inpatient rehab. Patient and his wife stated they were agreeable to skilled nursing placement at d/c to allow patient to continue to progress and reduce burden of care prior to returning home. Recommending 2 weeks of continued high intensity PT to continue to progress with patient's motor recovery for improved functional mobility prior to progressing to SNF placement. Patient and his wife in agreement.   Patient in power w/c handed off to [name] at end of session with breaks locked and all needs within reach.    Therapy Documentation Precautions:  Precautions Precautions: Fall,Other (comment) Precaution Comments: Strong BLE extensor tone, hx of R hand tremor Restrictions Weight Bearing Restrictions: No  Therapy/Group: Individual Therapy   L  PT, DPT  05/28/2020, 4:22 PM

## 2020-05-28 NOTE — Progress Notes (Signed)
Patient ID: Anthony Downs, male   DOB: 07-Aug-1950, 70 y.o.   MRN: [id number]  SW sent out SNF referrals.   SW received message from pt wife [name] who stated pt asked if we could wait to hold off on sending out SNF referral.   SW met with pt and pt wife in room to discuss SNF placement process, and potential challenges associated with waiting as there may not be beds available, and not having an idea on if their preferred SNFs are willing to accept. Pt has decided that he would like SW to move forward with finding a SNF.  SW spoke with [place] with [facility] [phone]) to discuss decline in Epic. <isread note that indicated pt was walking 310ft vs power wheelchair 243ft. States they also do not allow power w/c in their facility. SW will follow-up if medical team does not see as this being necessary.  *Medical team states a power chair will be needed due to poor sitting posture.   Loralee Pacas, MSW, [place] Office: [phone] Cell: [phone] Fax: [phone]

## 2020-05-29 NOTE — Progress Notes (Signed)
Physical Therapy Session Note  Patient Details  Name: Anthony Downs MRN: [id number] Date of Birth: 02-Mar-1950  Today's Date: 05/29/2020 PT Individual Time: 0800-0910 and 1320-1405 PT Individual Time Calculation (min): 70 min and 45 min    Short Term Goals: Week 4:  PT Short Term Goal 1 (Week 4): Patient will initiate dependent gait. PT Short Term Goal 2 (Week 4): Patient will perform sit to/from stand with max A of 1 person using LRAD. PT Short Term Goal 3 (Week 4): Patient will perform level transfers with mod A using LRAD. Week 5:     Skilled Therapeutic Interventions/Progress Updates:  Session 1 Pt received supine in bed and agreeable to PT. Supine>sit transfer with max A with +2 present for safety. Sitting balance EOB with supervision assist x 5 minutes.  Cues for use of LUE intermittently to maintain midline and prevent R lateral lean. [name] transfer to and from [facility]. Pt performed WC mobility through hall in power [facility] with supervision assist and cues for safety in turns through doorway. Stedy transfer to and from mat table with mod-max assist+2 to achieve standing.   Sit>stand with maxisky walking harness in place with max assist +2 with L knee  Blocked. Pt maintained standing tolerance in maxi sky walking sling x 45 sec with Mod-max assist +2 for safety with max cues for improved activation of the R knee extensors.    Pt returned to room and performed sara + transfer to bed with total A. Sit>supine completed with +2 assist and left supine in bed with call bell in reach and all needs met.   Session 2.   Pt received supine in bed, reports having just received lunch, and requesting time to perform feeding with Wife present. PT returned in 20 minutes, and pt agreeable to PT.  Rolling R and L x 2 with max assist each side L>R to perform clothing management following urination.    Supine>sit transfer with +2 assist and cues for sequencing and use of deep core to control rolling and  push into sitting. SARA + transfer to [place].   PT performed manual therapy to BLE to improve muscle length and decrease joint tightness with HS strecth, GLute med/piriformis stretch, Heel cord stretch each completed 1 min x 3 with overpressure into end range with no reports of pain.   Pt returned to room and performed [name] + transfer to bed with total A. Sit>supine completed with +2 assist, and left supine in bed with call bell in reach and all needs met.   Donned PRAFO while supine with assist of wife upon completion of therapy session      Therapy Documentation Precautions:  Precautions Precautions: Fall,Other (comment) Precaution Comments: Strong BLE extensor tone, hx of R hand tremor Restrictions Weight Bearing Restrictions: No Pain: Pain Assessment Pain Scale: 0-10 Pain Score: 0-No pain    Therapy/Group: Individual Therapy  Lorie Phenix 05/29/2020, 10:04 AM

## 2020-05-29 NOTE — Progress Notes (Signed)
Patient ID: LEEUM SANKEY, male   DOB: 1950/04/18, 70 y.o.   MRN: [id number]  SW spoke with Allison/liasion for [place] with regard to referral. Willing to accept pt. Pt will need to confirm if he has had COVID booster to avoid quarantine. Reports power chairs are not allowed until the pt has received education on managed of w/c. If pt has power w/c, he can be accepted but unable to order through insurance since pt going to SNF, and family would be required to private pay for item. SW updated medical team on above, and waiting on follow-up with next steps. SW shared if therapy comes up with alternative to a power w/c, the family may want me to revisit [facility] again. SW to follow-up with Ebony Hail on plan.   SW informed medical team on above, and waiting on best plan of care for patient.   Loralee Pacas, MSW, [place] Office: [phone] Cell: [phone] Fax: [phone]

## 2020-05-29 NOTE — Progress Notes (Signed)
Occupational Therapy Session Note  Patient Details  Name: JACODY BENEKE MRN: [id number] Date of Birth: 1950/06/03  Today's Date: 05/29/2020 OT Individual Time: 1030-1130 OT Individual Time Calculation (min): 60 min    Short Term Goals: Week 4:  OT Short Term Goal 1 (Week 4): Pt will manage trunk Sup>sit wiht MOD A consistently OT Short Term Goal 2 (Week 4): Pt will tolerate trialing toileting OOB in sara + daily OT Short Term Goal 3 (Week 4): Pt will don shirt with no more than MIN A for sitting balnace on EOB OT Short Term Goal 4 (Week 4): pt will thread 1LE into pants  Skilled Therapeutic Interventions/Progress Updates:    Pt resting in bed upon arrival with wife present. OT intervention with focus on bed mobility, sitting balance, sit<>stand with Stedy, standing balance with Stedy, and activity tolerance. Supine<>sit EOB with tot A+2. Sitting balance with supervision. Sit<>stand in [place] with min A from elevated EOB. Pt performed sit<>stand from EOB X 3. Pt Performed sit<>stand from paddles of Stedy X 5 with min A/CGA.  Pt returned to supine and brief changed with pt rolling R/L; mod A to Rt and max A to Lt. Pt remained in bed with all needs within reach. Bed alarm activated and wife present. All needs within reach.   Therapy Documentation Precautions:  Precautions Precautions: Fall,Other (comment) Precaution Comments: Strong BLE extensor tone, hx of R hand tremor Restrictions Weight Bearing Restrictions: No  Pain:  Pt c/o Lt calf cramp/pain with standing; repositioned and soft tissue mobilizations   Therapy/Group: Individual Therapy  Leroy Libman 05/29/2020, 12:19 PM

## 2020-05-29 NOTE — Progress Notes (Signed)
PROGRESS NOTE   Subjective/Complaints:  No new issues. Was finishing up with PT when I came in this morning. Was fatigued at end of session  ROS: Limited due to cognitive/behavioral   Objective:   No results found. Recent Labs    05/27/20 0459  WBC 6.8  HGB 12.7*  HCT 40.1  PLT 238   Recent Labs    05/27/20 0459  NA 139  K 3.6  CL 101  CO2 31  GLUCOSE 118*  BUN 8  CREATININE 0.89  CALCIUM 9.3    Intake/Output Summary (Last 24 hours) at 05/29/2020 1104 Last data filed at 05/29/2020 0700 Gross per 24 hour  Intake 480 ml  Output 100 ml  Net 380 ml        Physical Exam: Vital Signs Blood pressure (!) 141/77, pulse 67, temperature 97.9 F (36.6 C), temperature source Oral, resp. rate 20, height 6' (1.829 m), weight 124.5 kg, SpO2 95 %.     Constitutional: No distress . Vital signs reviewed. HEENT: EOMI, oral membranes moist Neck: supple Cardiovascular: RRR without murmur. No JVD    Respiratory/Chest: CTA Bilaterally without wheezes or rales. Normal effort    GI/Abdomen: BS +, non-tender, non-distended Ext: no clubbing, cyanosis, or edema Psych: pleasant and cooperative Skin: scalp incision clean, drainage as ceased  Neurologic: remains apraxic, more attentive.  Pill rolling Tremor in RUE more noticeable. Cranial nerves II through XII intact, motor strength is 5/5 in Left and 0/5 right deltoid, bicep, grip 1/5. Trace at best/5 right hip flexor, knee extensors, 1+ left hip flexor, KE--no changes. 0/5 ankle dorsiflexor and plantar flexor bilaterally. Continued extensor tone present.  Musculoskeletal: mild bilateral shoulder discomfort with ROM      Assessment/Plan: 1. Functional deficits which require 3+ hours per day of interdisciplinary therapy in a comprehensive inpatient rehab setting.  Physiatrist is providing close team supervision and 24 hour management of active medical problems listed  below.  Physiatrist and rehab team continue to assess barriers to discharge/monitor patient progress toward functional and medical goals  Care Tool:  Bathing    Body parts bathed by patient: Left arm,Chest,Abdomen,Front perineal area,Face   Body parts bathed by helper: Right arm,Buttocks,Right upper leg,Left upper leg,Right lower leg,Left lower leg     Bathing assist Assist Level: Maximal Assistance - Patient 24 - 49%     Upper Body Dressing/Undressing Upper body dressing   What is the patient wearing?: Pull over shirt    Upper body assist Assist Level: Moderate Assistance - Patient 50 - 74%    Lower Body Dressing/Undressing Lower body dressing      What is the patient wearing?: Pants     Lower body assist Assist for lower body dressing: Dependent - Patient 0%     Toileting Toileting    Toileting assist Assist for toileting: 2 Helpers     Transfers Chair/bed transfer  Transfers assist  Chair/bed transfer activity did not occur: Safety/medical concerns  Chair/bed transfer assist level: Maximal Assistance - Patient 25 - 49% Chair/bed transfer assistive device: Sliding board   Locomotion Ambulation   Ambulation assist   Ambulation activity did not occur: Safety/medical concerns  Walk 10 feet activity   Assist  Walk 10 feet activity did not occur: Safety/medical concerns        Walk 50 feet activity   Assist Walk 50 feet with 2 turns activity did not occur: Safety/medical concerns         Walk 150 feet activity   Assist Walk 150 feet activity did not occur: Safety/medical concerns         Walk 10 feet on uneven surface  activity   Assist Walk 10 feet on uneven surfaces activity did not occur: Safety/medical concerns         Wheelchair     Assist Will patient use wheelchair at discharge?: Yes (TBD but likely) Type of Wheelchair: Power    Wheelchair assist level: Supervision/Verbal cueing Max wheelchair  distance: >100 ft    Wheelchair 50 feet with 2 turns activity    Assist    Wheelchair 50 feet with 2 turns activity did not occur: Safety/medical concerns   Assist Level: Supervision/Verbal cueing   Wheelchair 150 feet activity     Assist  Wheelchair 150 feet activity did not occur: Safety/medical concerns       Blood pressure (!) 141/77, pulse 67, temperature 97.9 F (36.6 C), temperature source Oral, resp. rate 20, height 6' (1.829 m), weight 124.5 kg, SpO2 95 %.    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   -no showering for now until scalp drainage resolves -ELOS/Goals:5/23, will likely need SNF  -Continue CIR therapies including PT, OT, and SLP    -path is positive for Grade 1 meningioma.     2. Left posterior tibial vein DVT:  Vascular ultrasound positive for age indeterminate left posterior tibial vein.   -continue xarelto. -antiplatelet therapy: N/A 3. Post-operative pain:tylenol prn  -voltaren gel for right shoulder---will rx left shoulder as well   -work on appropriate techniques, ROM with therapy  -continue baclofen 10mg  qhs for cramps and to assist with sleep   4. Mood/sleep:  improved  -improved with scheduled trazodone 50mg ---continue  - rx pain, on baclofen too -antipsychotic agents: N/A  -generally improving-continue with trazodone and melatonin  -added ritalin to help with sleep/wake cycle---pt reports benefit. Still delayed though  5/9 increased ritalin to 10mg  with benefit 5. Neuropsych: This patientiscapable of making decisions on hisown behalf. 6. Skin/Wound Care:rash resolved   -5/12 incisional drainage better--change to foam dressing 7. Fluids/Electrolytes/Nutrition:eating well     -Potassium level  borderline---supplemented 8. Hypertension. Lasix 20 mg daily   -Restarted home Flomax HS which will also help with BP   -no norvasc d/t swelling   5/2 lasix increased to 40mg  daily for elevated bp's   5/10 bp controlled/borderline     -continue metoprolol    -lasix at 20mg  daily   9. Hyperlipidemia. Lipitor 10. BPH: increased Flomax to 0.8mg  HS 11. Urinary frequency: mostly incontinent  -UA negative.  timed voiding q2H while awake with urinal- placed nursing order   -Flomax to 0.8mg  given continued symptoms and elevated BP 12. Leukocytosis. Resolved off decadron 13. Constipation:  -continue senna-s bid  -moving bowels regularly now, having flatus        LOS: 27 days A FACE TO [place] 05/29/2020, 11:04 AM

## 2020-05-30 LAB — BASIC METABOLIC PANEL
Anion gap: 7 (ref 5–15)
BUN: 8 mg/dL (ref 8–23)
CO2: 27 mmol/L (ref 22–32)
Calcium: 9.1 mg/dL (ref 8.9–10.3)
Chloride: 104 mmol/L (ref 98–111)
Creatinine, Ser: 0.83 mg/dL (ref 0.61–1.24)
GFR, Estimated: >60 mL/min (ref >60)
Glucose, Bld: 115 mg/dL — ABNORMAL HIGH (ref 70–99)
Potassium: 3.6 mmol/L (ref 3.5–5.1)
Sodium: 138 mmol/L (ref 135–145)

## 2020-05-30 LAB — CBC
HCT: 38.9 % — ABNORMAL LOW (ref 39.0–52.0)
Hemoglobin: 12.6 g/dL — ABNORMAL LOW (ref 13.0–17.0)
MCH: 30.3 pg (ref 26.0–34.0)
MCHC: 32.4 g/dL (ref 30.0–36.0)
MCV: 93.5 fL (ref 80.0–100.0)
Platelets: 223 10*3/uL (ref 150–400)
RBC: 4.16 MIL/uL — ABNORMAL LOW (ref 4.22–5.81)
RDW: 12.7 % (ref 11.5–15.5)
WBC: 7.4 10*3/uL (ref 4.0–10.5)
nRBC: 0 % (ref 0.0–0.2)

## 2020-05-30 NOTE — Progress Notes (Signed)
PROGRESS NOTE   Subjective/Complaints:  Pt doing fairly well. Had a good session with therapy this morning. Seemed to be transferring more easily  ROS: Limited due to cognitive/behavioral    Objective:   No results found. Recent Labs    05/30/20 0538  WBC 7.4  HGB 12.6*  HCT 38.9*  PLT 223   Recent Labs    05/30/20 0538  NA 138  K 3.6  CL 104  CO2 27  GLUCOSE 115*  BUN 8  CREATININE 0.83  CALCIUM 9.1    Intake/Output Summary (Last 24 hours) at 05/30/2020 1152 Last data filed at 05/30/2020 0835 Gross per 24 hour  Intake 440 ml  Output --  Net 440 ml        Physical Exam: Vital Signs Blood pressure (!) 156/73, pulse 61, temperature 98.3 F (36.8 C), temperature source Oral, resp. rate 14, height 6' (1.829 m), weight 124.5 kg, SpO2 96 %.     Constitutional: No distress . Vital signs reviewed. HEENT: EOMI, oral membranes moist Neck: supple Cardiovascular: RRR without murmur. No JVD    Respiratory/Chest: CTA Bilaterally without wheezes or rales. Normal effort    GI/Abdomen: BS +, non-tender, non-distended Ext: no clubbing, cyanosis, or edema Psych: pleasant and cooperative Skin: scalp incision clean, drainage as ceased. Residual scab, small seroma. Neurologic: remains apraxic, more attentive and initiates more..  Pill rolling Tremor in RUE more noticeable, ?cogwheeling. Cranial nerves II through XII intact, motor strength is 5/5 in Left and 0/5 right deltoid, bicep, grip 1/5. Trace at best/5 right hip flexor, knee extensors, 1+ left hip flexor, KE--no changes. 0/5 ankle dorsiflexor and plantar flexor bilaterally. Continued extensor tone present RLE>LLE Musculoskeletal: mild bilateral shoulder discomfort with ROM      Assessment/Plan: 1. Functional deficits which require 3+ hours per day of interdisciplinary therapy in a comprehensive inpatient rehab setting.  Physiatrist is providing close team  supervision and 24 hour management of active medical problems listed below.  Physiatrist and rehab team continue to assess barriers to discharge/monitor patient progress toward functional and medical goals  Care Tool:  Bathing    Body parts bathed by patient: Left arm,Chest,Abdomen,Front perineal area,Face   Body parts bathed by helper: Right arm,Buttocks,Right upper leg,Left upper leg,Right lower leg,Left lower leg     Bathing assist Assist Level: Maximal Assistance - Patient 24 - 49%     Upper Body Dressing/Undressing Upper body dressing   What is the patient wearing?: Pull over shirt    Upper body assist Assist Level: Moderate Assistance - Patient 50 - 74%    Lower Body Dressing/Undressing Lower body dressing      What is the patient wearing?: Pants     Lower body assist Assist for lower body dressing: Dependent - Patient 0%     Toileting Toileting    Toileting assist Assist for toileting: 2 Helpers     Transfers Chair/bed transfer  Transfers assist  Chair/bed transfer activity did not occur: Safety/medical concerns  Chair/bed transfer assist level: Dependent - mechanical lift Chair/bed transfer assistive device: Sliding board   Locomotion Ambulation   Ambulation assist   Ambulation activity did not occur: Safety/medical concerns  Walk 10 feet activity   Assist  Walk 10 feet activity did not occur: Safety/medical concerns        Walk 50 feet activity   Assist Walk 50 feet with 2 turns activity did not occur: Safety/medical concerns         Walk 150 feet activity   Assist Walk 150 feet activity did not occur: Safety/medical concerns         Walk 10 feet on uneven surface  activity   Assist Walk 10 feet on uneven surfaces activity did not occur: Safety/medical concerns         Wheelchair     Assist Will patient use wheelchair at discharge?: Yes (TBD but likely) Type of Wheelchair: Power    Wheelchair  assist level: Supervision/Verbal cueing Max wheelchair distance: >100 ft    Wheelchair 50 feet with 2 turns activity    Assist    Wheelchair 50 feet with 2 turns activity did not occur: Safety/medical concerns   Assist Level: Supervision/Verbal cueing   Wheelchair 150 feet activity     Assist  Wheelchair 150 feet activity did not occur: Safety/medical concerns       Blood pressure (!) 156/73, pulse 61, temperature 98.3 F (36.8 C), temperature source Oral, resp. rate 14, height 6' (1.829 m), weight 124.5 kg, SpO2 96 %.    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   -may shower -ELOS/Goals:5/23, is making some gains but will likely need SNF  -Continue CIR therapies including PT, OT, and SLP    -path is positive for Grade 1 meningioma.     2. Left posterior tibial vein DVT:  Vascular ultrasound positive for age indeterminate left posterior tibial vein.   -continue xarelto. -antiplatelet therapy: N/A 3. Post-operative pain:tylenol prn  -voltaren gel for right shoulder---will rx left shoulder as well  -continue baclofen 10mg  qhs for cramps and to assist with sleep   4. Mood/sleep:  improved  -improved with scheduled trazodone 50mg ---continue  - rx pain, on baclofen too -antipsychotic agents: N/A  -generally improving-continue with trazodone and melatonin  -continue ritalin to help with sleep/wake cycle, initiation. Does seem to have helped. 5. Neuropsych: This patientiscapable of making decisions on hisown behalf. 6. Skin/Wound Care:rash resolved   -5/13 incisional drainage better--continue foam dressing 7. Fluids/Electrolytes/Nutrition:eating well     -Potassium level borderline---supplemented    5/13 K+ 3.6 8. Hypertension. Lasix 20 mg  daily   -Restarted home Flomax HS which will also help with BP   -no norvasc d/t swelling   5/2 lasix increased to 40mg  daily for elevated bp's   5/10 bp controlled/borderline     -continue metoprolol    -lasix at 20mg  daily   9. Hyperlipidemia. Lipitor 10. BPH: increased Flomax to 0.8mg  HS 11. Urinary frequency: mostly incontinent  -UA negative.  timed voiding q2H while awake with urinal- placed nursing order   -continue Flomax to 0.8mg  given continued symptoms and HTN 12. Leukocytosis. Resolved off decadron 13. Constipation:  -continue senna-s bid  -moving bowels regularly now, having flatus        LOS: 28 days A FACE TO [place] 05/30/2020, 11:52 AM

## 2020-05-30 NOTE — Progress Notes (Signed)
Occupational Therapy Weekly Progress Note  Patient Details  Name: Anthony Downs MRN: [id number] Date of Birth: 1950-05-01  Beginning of progress report period: May 23, 2020 End of progress report period: May 30, 2020   Session 1: Today's Date: 05/30/2020 OT Individual Time: 1000-1100 OT Individual Time Calculation (min): 60 min   Session 2: Today's Date: 05/30/2020 OT Individual Time: 1300-1400 OT Individual Time Calculation (min): 60 min   Patient has met 3 of 4 short term goals.  Pt continues to make slow but steady progress. Pt has initiated trialing standing in stedy and with RW this reporting period. Pt continues to require total A via sara + to transfer to [facility] or [place]. Pt family has decided to pursue skilled nursing for DC at first however this clinician continues to recommend skilled OT at IP level to decrease BOC and maximize functional outcomes prior to DC SNF.   Patient continues to demonstrate the following deficits: muscle weakness, decreased cardiorespiratoy endurance, impaired timing and sequencing, abnormal tone, unbalanced muscle activation, motor apraxia, decreased coordination and decreased motor planning, decreased attention, decreased awareness, decreased problem solving, decreased safety awareness, decreased memory and delayed processing and decreased sitting balance, decreased standing balance, decreased postural control and decreased balance strategies and therefore will continue to benefit from skilled OT intervention to enhance overall performance with BADL and iADL.  Patient progressing toward long term goals..  Continue plan of care.  OT Short Term Goals Week 3:  OT Short Term Goal 1 (Week 3): Pt will complete 2/4 steps of UB dressing OT Short Term Goal 1 - Progress (Week 3): Met OT Short Term Goal 2 (Week 3): pt will mange trunk from supine>sit with MOD A consistently OT Short Term Goal 2 - Progress (Week 3): Progressing toward goal OT Short Term Goal 3  (Week 3): Pt will use RUE as stabilizer with MIN A consistently OT Short Term Goal 3 - Progress (Week 3): Met Week 4:  OT Short Term Goal 1 (Week 4): Pt will manage trunk Sup>sit wiht MOD A consistently OT Short Term Goal 1 - Progress (Week 4): Progressing toward goal OT Short Term Goal 2 (Week 4): Pt will tolerate trialing toileting OOB in sara + daily OT Short Term Goal 2 - Progress (Week 4): Met OT Short Term Goal 3 (Week 4): Pt will don shirt with no more than MIN A for sitting balnace on EOB OT Short Term Goal 3 - Progress (Week 4): Met OT Short Term Goal 4 (Week 4): pt will thread 1LE into pants OT Short Term Goal 4 - Progress (Week 4): Progressing toward goal  Skilled Therapeutic Interventions/Progress Updates:    Session 1: Pt received in bed agreeable to OT. Pt able to thread LLE into pants at bed level with VC for crossing into figure 4. Pt requires +2 A to assume EOB and completes sit to stand in stedy 4x during session with MAX +2 fading to MAX A of 1. Pt likely only needing MOD A for sit to stand in stedy. Pt transferred to [facility] in stedy requiring +2 A to scoot back into seat. Pt taken outside with total A for steering to complete self AAROM to completes chest press and shoulder press to target. Pt assigned 5 reps with wife every night. Exited session with pt seated in PWC, exit alarm on and call light in reach  Session 2: pt received in bed agreeable to OT with no pain reported. OT offered a shower and pt stated, "  absolutely." Pt requires MAX A for bed mobility to EOB and MOD A sit to stand at elevated EOB. Increased time for pt to process stand>sit throughout session. Pt bathes seated on BSC with A to wash B feet, back and buttocks. Pt able to bend forward with feet on stool and wash BLE down to ankles on bariatric BSC. Pt requires MAX A of 2 to stand in stedy from [facility] to return to bed and don brief at bed level total A. Pt states, "you need a promotion that was amazing." Pt very  appreciative of shower. Saebo applied to R deltoid with good response.  Saebo Stim One 330 pulse width 35 Hz pulse rate On 8 sec/ off 8 sec Ramp up/ down 2 sec Symmetrical Biphasic wave form  Max intensity 159mA at 500 Ohm load 60 min stimulation. Skin in tact at end of stim.  Exited session with pt seated in bed, exit alarm on and call light in reach    Therapy Documentation Precautions:  Precautions Precautions: Fall,Other (comment) Precaution Comments: Strong BLE extensor tone, hx of R hand tremor Restrictions Weight Bearing Restrictions: No General:   Vital Signs: Therapy Vitals Temp: 98.3 F (36.8 C) Temp Source: Oral Pulse Rate: 61 Resp: 14 BP: (!) 156/73 Patient Position (if appropriate): Lying Oxygen Therapy SpO2: 96 % O2 Device: Room Air Pain:   ADL: ADL Grooming: Moderate assistance (per pt report) Where Assessed-Grooming: Bed level Upper Body Bathing: Moderate assistance Where Assessed-Upper Body Bathing: Bed level Lower Body Bathing: Dependent (+2 rolling) Where Assessed-Lower Body Bathing: Bed level Upper Body Dressing: Dependent (+2 for rolling to pull down back) Where Assessed-Upper Body Dressing: Bed level Lower Body Dressing: Dependent Where Assessed-Lower Body Dressing: Bed level Toileting: Unable to assess Vision   Perception    Praxis   Exercises:   Other Treatments:     Therapy/Group: Individual Therapy  [name] 05/30/2020, 6:53 AM

## 2020-05-30 NOTE — Progress Notes (Signed)
Physical Therapy Session Note  Patient Details  Name: Anthony Downs MRN: [id number] Date of Birth: [birth year]  Today's Date: 05/30/2020 PT Individual Time: 1100-1155 PT Individual Time Calculation (min): 55 min   Short Term Goals: Week 1:  PT Short Term Goal 1 (Week 1): Pt will perform R/L rolling in bed with +2 mod assist using bed features PT Short Term Goal 1 - Progress (Week 1): Partly met (roll R with mod A +1, roll L with max A +2) PT Short Term Goal 2 (Week 1): Pt will tolerate sitting EOB for at least 15 minutes during therapy PT Short Term Goal 2 - Progress (Week 1): Progressing toward goal PT Short Term Goal 3 (Week 1): Pt will maintain dynamic sitting balance EOM with max assist for at least 3 minutes PT Short Term Goal 3 - Progress (Week 1): Progressing toward goal PT Short Term Goal 4 (Week 1): Pt will perform supine<>sitting with +2 max assist PT Short Term Goal 4 - Progress (Week 1): Met PT Short Term Goal 5 (Week 1): Pt will tolerate sitting EOB for at least 15 minutes during therapy Week 2:  PT Short Term Goal 1 (Week 2): Pt will maintain dynamic sitting balance EOM with max assist for at least 3 minutes PT Short Term Goal 1 - Progress (Week 2): Met PT Short Term Goal 2 (Week 2): Pt will perform bed mobility with max A +1 consistently. PT Short Term Goal 2 - Progress (Week 2): Progressing toward goal PT Short Term Goal 3 (Week 2): Patient will tolerate dependent lower extremity weight bearing >5 min using LRAD. PT Short Term Goal 3 - Progress (Week 2): Met PT Short Term Goal 4 (Week 2): Patient will initiate power mobility. PT Short Term Goal 4 - Progress (Week 2): Met Week 3:  PT Short Term Goal 1 (Week 3): Pt will perform bed mobility with max A +1 consistently. PT Short Term Goal 1 - Progress (Week 3): Met PT Short Term Goal 2 (Week 3): Patient will perform dynamic sitting balance at least 5 min with min A. PT Short Term Goal 2 - Progress (Week 3): Met PT Short  Term Goal 3 (Week 3): Patient will perform pressure relief in power w/c indepenently. PT Short Term Goal 3 - Progress (Week 3): Met PT Short Term Goal 4 (Week 3): Patient will propel power w/c on outdoor/community surfaces >150 ft. PT Short Term Goal 4 - Progress (Week 3): Progressing toward goal PT Short Term Goal 5 (Week 3): Patient will perform dependent standing >8 min. PT Short Term Goal 5 - Progress (Week 3): Met  Skilled Therapeutic Interventions/Progress Updates:    .Pain:  Pt reports no pain.  Treatment to tolerance.  Rest breaks and repositioning as needed.  Pt initially sleeping in [place], able to easily but briefly awaken and agreeable to treatment session w/focus on global strengthening, standing tolerance, Sit to stand transition.  Pt transported to gym, received meds en route.  Pt set up in parallel bars and therapist assists w/placing R hand onto bar, pt able to grasp.    Sit to stand w/heavy cueing for sequencing, forward trunk flexion, mod to max assist to achieve upright, therapist blocking R knee.  Repeated transition w/emphasis on ant wt shift mult times duing session.  Assist primarily required for initiation and first 30-40% of transition when pt able to complete transition w/cues and min to mod assist.    Pt stood up to 1 min at time x  5 reps, mirror used for feedback.  Attempted single step but pt unable to initiate movement even w/max assist for wt shifting and max cues for initiation on L   Sit to stand in [place] w/mod assist, cues for wt shifting, therapist places RUE/pt able to grasp.  wc to bed via [place] then semistand to stand to sit w/tactile cues for sequencing.  Sit to supine w/max assist of 2/pt fatigued.  Wife at bedside and discussed progress.   Pt much more alert when upright, good progress. Therapy Documentation Precautions:  Precautions Precautions: Fall,Other (comment) Precaution Comments: Strong BLE extensor tone, hx of R hand tremor Restrictions Weight  Bearing Restrictions: No   Therapy/Group: Individual Therapy  Callie Fielding, [place] 05/30/2020, 12:06 PM

## 2020-05-31 MED ORDER — HYDROXYZINE HCL 25 MG PO TABS
25.0000 mg | ORAL_TABLET | Freq: Three times a day (TID) | ORAL | Status: DC | PRN
  Administered 2020-05-31 – 2020-06-09 (×3): 25 mg via ORAL
  Filled 2020-05-31 (×3): qty 1

## 2020-05-31 NOTE — Progress Notes (Deleted)
Pt calls for assistance. Entered pt's room. Pt is undressed with a clean brief on the floor. Asked pt if there was a reason he took on his brief and threw it on the floor. Pt states "I must have had a dream and took it off". Placed clean brief on pt.

## 2020-05-31 NOTE — Progress Notes (Signed)
Occupational Therapy Session Note  Patient Details  Name: Anthony Downs MRN: [id number] Date of Birth: May 05, 1950  Today's Date: 05/31/2020 OT Individual Time: 1000-1100 OT Individual Time Calculation (min): 60 min   Today's Date: 05/31/2020 OT Individual Time: 1430-1520 OT Individual Time Calculation (min): 50 min   Short Term Goals: Week 1:  OT Short Term Goal 1 (Week 1): Pt will roll with MAX A of 1 caregiver to decrease BOC for toileitng OT Short Term Goal 1 - Progress (Week 1): Met OT Short Term Goal 2 (Week 1): Pt will tolerate laying sidelying on R 1x per day to assist with tone management OT Short Term Goal 2 - Progress (Week 1): Met OT Short Term Goal 3 (Week 1): Pt will recall hemi dressing techniques with MOD VC OT Short Term Goal 3 - Progress (Week 1): Progressing toward goal OT Short Term Goal 4 (Week 1): Pt will sit EOB 10 min with mod A in prep for ADL OT Short Term Goal 4 - Progress (Week 1): Met  Skilled Therapeutic Interventions/Progress Updates:    Session 1: pt received in bed agreeale to OT and dressed/ready to go. MOD-MAX A sit to stand and bed mobility throughout session with stedy utilized to transfers to/from [facility]  With +1 A. Pt steers to tx gym to work on trunk control activities anteriore weight shift 3x5 with 1KG med ball and OT faciliating chest press with RUE reaching forward. Anterior reach to pegs with pt placing in bucket on L to decrease R lean.  Sa[place]o Stim One 330 pulse width 35 Hz pulse rate On 8 sec/ off 8 sec Ramp up/ down 2 sec Symmetrical Biphasic wave form  Max intensity 123mA at 500 Ohm load Deltoid stim with good response and skin in tact after 60 min unattended   Exited session with pt seated in bed, exit alarm on and call light in reach  Session 2: pt received in bed with wife and brother present. Pt agreeable to shower this session and requesting to don only brief. Pt utilized stedy with MOD A of 1 off EOB and MAX A off of BSC in  shower. Pt requires total A to doff LB clothing and footwear but doffs shirt with S. Pt with improved activation in RUE assisting wash LUE with A to hold onto washcloth. Pt able to wash all body parts except back, buttocks, and R foot. Pt able to lean forward and wash peri area with cutout of BSC. Exited session with pt seated in bed, brief donned total A, exit alarm on and call light in reach.   Therapy Documentation Precautions:  Precautions Precautions: Fall,Other (comment) Precaution Comments: Strong BLE extensor tone, hx of R hand tremor Restrictions Weight Bearing Restrictions: No General:   Vital Signs: Therapy Vitals Temp: 97.9 F (36.6 C) Pulse Rate: 79 Resp: 18 BP: (!) 152/84 Patient Position (if appropriate): Lying Oxygen Therapy SpO2: 96 % O2 Device: Room Air Pain: Pain Assessment Pain Scale: 0-10 Pain Score: 2  Pain Type: Acute pain Pain Location: Arm Pain Orientation: Right;Left Pain Descriptors / Indicators: Aching Pain Frequency: Intermittent Pain Onset: Gradual Patients Stated Pain Goal: 1 Pain Intervention(s): Medication (See eMAR) ADL: ADL Grooming: Moderate assistance (per pt report) Where Assessed-Grooming: Bed level Upper Body Bathing: Moderate assistance Where Assessed-Upper Body Bathing: Bed level Lower Body Bathing: Dependent (+2 rolling) Where Assessed-Lower Body Bathing: Bed level Upper Body Dressing: Dependent (+2 for rolling to pull down back) Where Assessed-Upper Body Dressing: Bed level Lower Body  Dressing: Dependent Where Assessed-Lower Body Dressing: Bed level Toileting: Unable to assess Vision   Perception    Praxis   Exercises:   Other Treatments:     Therapy/Group: Individual Therapy  [name] 05/31/2020, 6:49 AM

## 2020-05-31 NOTE — Progress Notes (Signed)
PROGRESS NOTE   Subjective/Complaints:  Per OT, increased tone R>L LE No HA  ROS: Limited due to cognitive/behavioral    Objective:   No results found. Recent Labs    05/30/20 0538  WBC 7.4  HGB 12.6*  HCT 38.9*  PLT 223   Recent Labs    05/30/20 0538  NA 138  K 3.6  CL 104  CO2 27  GLUCOSE 115*  BUN 8  CREATININE 0.83  CALCIUM 9.1    Intake/Output Summary (Last 24 hours) at 05/31/2020 1051 Last data filed at 05/31/2020 0801 Gross per 24 hour  Intake 640 ml  Output --  Net 640 ml        Physical Exam: Vital Signs Blood pressure (!) 152/84, pulse 79, temperature 97.9 F (36.6 C), resp. rate 18, height 6' (1.829 m), weight 124.5 kg, SpO2 96 %.  General: No acute distress Mood and affect are appropriate Heart: Regular rate and rhythm no rubs murmurs or extra sounds Lungs: Clear to auscultation, breathing unlabored, no rales or wheezes Abdomen: Positive bowel sounds, soft nontender to palpation, nondistended Extremities: No clubbing, cyanosis, or edema   Skin: scalp incision clean, drainage as ceased. Residual scab, small seroma. Neurologic: remains apraxic, more attentive and initiates more..  Pill rolling Tremor in RUE more noticeable, ?cogwheeling. Cranial nerves II through XII intact, motor strength is 5/5 in Left and 0/5 right deltoid, bicep, grip 1/5. Trace at best/5 right hip flexor, knee extensors, 1+ left hip flexor, KE--no changes. 0/5 ankle dorsiflexor and plantar flexor bilaterally. Continued extensor tone present RLE>LLE Musculoskeletal: mild bilateral shoulder discomfort with ROM      Assessment/Plan: 1. Functional deficits which require 3+ hours per day of interdisciplinary therapy in a comprehensive inpatient rehab setting.  Physiatrist is providing close team supervision and 24 hour management of active medical problems listed below.  Physiatrist and rehab team continue to assess  barriers to discharge/monitor patient progress toward functional and medical goals  Care Tool:  Bathing    Body parts bathed by patient: Left arm,Chest,Abdomen,Front perineal area,Face   Body parts bathed by helper: Right arm,Buttocks,Right upper leg,Left upper leg,Right lower leg,Left lower leg     Bathing assist Assist Level: Maximal Assistance - Patient 24 - 49%     Upper Body Dressing/Undressing Upper body dressing   What is the patient wearing?: Pull over shirt    Upper body assist Assist Level: Moderate Assistance - Patient 50 - 74%    Lower Body Dressing/Undressing Lower body dressing      What is the patient wearing?: Pants     Lower body assist Assist for lower body dressing: Dependent - Patient 0%     Toileting Toileting    Toileting assist Assist for toileting: 2 Helpers     Transfers Chair/bed transfer  Transfers assist  Chair/bed transfer activity did not occur: Safety/medical concerns  Chair/bed transfer assist level: Dependent - mechanical lift Chair/bed transfer assistive device: Sliding board   Locomotion Ambulation   Ambulation assist   Ambulation activity did not occur: Safety/medical concerns          Walk 10 feet activity   Assist  Walk 10 feet activity did  not occur: Safety/medical concerns        Walk 50 feet activity   Assist Walk 50 feet with 2 turns activity did not occur: Safety/medical concerns         Walk 150 feet activity   Assist Walk 150 feet activity did not occur: Safety/medical concerns         Walk 10 feet on uneven surface  activity   Assist Walk 10 feet on uneven surfaces activity did not occur: Safety/medical concerns         Wheelchair     Assist Will patient use wheelchair at discharge?: Yes (TBD but likely) Type of Wheelchair: Power    Wheelchair assist level: Supervision/Verbal cueing Max wheelchair distance: >100 ft    Wheelchair 50 feet with 2 turns  activity    Assist    Wheelchair 50 feet with 2 turns activity did not occur: Safety/medical concerns   Assist Level: Supervision/Verbal cueing   Wheelchair 150 feet activity     Assist  Wheelchair 150 feet activity did not occur: Safety/medical concerns       Blood pressure (!) 152/84, pulse 79, temperature 97.9 F (36.6 C), resp. rate 18, height 6' (1.829 m), weight 124.5 kg, SpO2 96 %.    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   -may shower -ELOS/Goals:5/23, is making some gains but will likely need SNF  -Continue CIR therapies including PT, OT, and SLP    -path is positive for Grade 1 meningioma.     2. Left posterior tibial vein DVT:  Vascular ultrasound positive for age indeterminate left posterior tibial vein.   -continue xarelto. -antiplatelet therapy: N/A 3. Post-operative pain:tylenol prn  -voltaren gel for right shoulder---will rx left shoulder as well  -continue baclofen 10mg  qhs for cramps and to assist with sleep   4. Mood/sleep:  improved  -improved with scheduled trazodone 50mg ---continue  - rx pain, on baclofen too -antipsychotic agents: N/A  -generally improving-continue with trazodone and melatonin  -continue ritalin to help with sleep/wake cycle, initiation. Does seem to have helped. 5. Neuropsych: This patientiscapable of making decisions on hisown behalf. 6. Skin/Wound Care:rash resolved   -5/13 incisional drainage better--continue foam dressing 7. Fluids/Electrolytes/Nutrition:eating well     -Potassium level borderline---supplemented    5/13 K+ 3.6 8. Hypertension. Lasix 20 mg daily   -Restarted home Flomax HS which will also help with BP   -no norvasc d/t swelling   5/2 lasix increased to 40mg  daily for elevated  bp's   5/10 bp controlled/borderline     -continue metoprolol    -lasix at 20mg  daily   Vitals:   05/30/20 2029 05/31/20 0413  BP: (!) 143/81 (!) 152/84  Pulse: 66 79  Resp: 16 18  Temp: 98.1 F (36.7 C) 97.9 F (36.6 C)  SpO2: 97% 19%  mild/mod systolic elevation will monitor for now but may need to increase lasix  9. Hyperlipidemia. Lipitor 10. BPH: increased Flomax to 0.8mg  HS 11. Urinary frequency: mostly incontinent  -UA negative.  timed voiding q2H while awake with urinal- placed nursing order   -continue Flomax to 0.8mg  given continued symptoms and HTN 12. Leukocytosis. Resolved off decadron 13. Constipation:  -continue senna-s bid  -moving bowels regularly now, having flatus        LOS: 29 days A FACE TO [place] E Anthony Downs 05/31/2020, 10:51 AM

## 2020-06-01 NOTE — Progress Notes (Signed)
+/-   sleep. PRN atarax & PRN tylenol given at 2001. Mostly complains of HA. BLE edema noted. Declined scheduled voltaren gel and hydrocortisone cream. Incontinent of urine even with timed toileting. Patrici Ranks A

## 2020-06-01 NOTE — Progress Notes (Signed)
Physical Therapy Session Note  Patient Details  Name: Anthony Downs MRN: [id number] Date of Birth: 1950/05/13  Today's Date: 06/01/2020 PT Individual Time: 0910-1010 PT Individual Time Calculation (min): 60 min   Short Term Goals: Week 4:  PT Short Term Goal 1 (Week 4): Patient will initiate dependent gait. PT Short Term Goal 2 (Week 4): Patient will perform sit to/from stand with max A of 1 person using LRAD. PT Short Term Goal 3 (Week 4): Patient will perform level transfers with mod A using LRAD.  Skilled Therapeutic Interventions/Progress Updates:     Patient in bed with his wife at bedside upon PT arrival. Patient alert and agreeable to PT session. Patient denied pain during session. Reports that he is having "a bad day." Patient unable to determine cause of this, reports sleeping well, but does report increased anxiety and frustration with his "slow progress" during his recovery. Educated on fluctuations during recovery, currently level of progress during stay, progress in functional motor return, and coping strategies with frustration tolerance is poor.   Discussed d/c planning with patient and his wife and concerns about access to power mobility if transitioned to SNF. Educated on patient's progress and challenges of knowing his future functional progress. Current deficits do indicate need for power mobility for improved functional independence with locomotion, ALDs, IADLs, and pressure relief. Will discuss challenges with rehab team and CSW to determine d/c plan to optimize patient's progress and independence.   Neuromuscular Re-ed: Patient performed the following activities: -R prolonged pressure with inhibitory bias to biceps for elbow extension in supine for reduced flexor tone in R upper extremity -Rolling R/L x3 focused on opposite arm reaching and trunk rotation without facilitation to R and min-mod facilitation to L with increased time for motor activation, patient able to  bend and straighten his L leg with cues throughout, required max facilitation on R leg -L side-lying to sit: focused on brining knees to chest x3 with improved core activation to bring legs off the bed, R shoulder rotation forward and driving his R hip to the bed while pushing with L hand to come to his elbow then back to side-lying x2, then pushing up to sitting EOB with min-mod facilitation and increased time for initiation -sitting EOB with supervision >5 min, performed reaching outside of BOS with R hand to grasp a styrofoam cup then bringing back to drink x2 with mod A for R arm support and facilitation, and hand-over-hand assist to keep cup from shaking due to R hand tremors  -sit to stand in [place] x2 with min progressing to supervision with facilitation or cues for R hand placement, forward weight shift, and hip and knee extension R>L, patient stood ~1 min each trial performing weight shifts between his feet -scooting back in w/c following transfer in [place], first trial with hands on Stedy with limited hip clearance, second trial with hands on arm rests, facilitated R hand placement with hand-over-hand assist, and retropulsion at the hips once weight fulling shifted into his feet  During rolling performed peri-care and doffed/donned incontinence brief and shorts due to bladder incontinence at beginning of session. Patient's wife reports patient has not been telling her when he needs to void this morning.   Patient propelled the power w/c within the room with supervision for steering cues in tight spaces. Performed pressure relief x1 with min cues due to decreased attention to task this session.   Patient in power w/c with his wife in the room at end  of session with power off and all needs within reach.    Therapy Documentation Precautions:  Precautions Precautions: Fall,Other (comment) Precaution Comments: Strong BLE extensor tone, hx of R hand tremor Restrictions Weight Bearing  Restrictions: No   Therapy/Group: Individual Therapy  Candee Hoon L Tanyiah Laurich PT, DPT  06/01/2020, 1:32 PM

## 2020-06-02 NOTE — Progress Notes (Signed)
Patient ID: Anthony Downs, male   DOB: Jun 06, 1950, 70 y.o.   MRN: [id number]  Per PT, pt wife is considering taking pt home due to power chair pt is required to have. SW to follow-up with pt and pt wife to discuss further.  Loralee Pacas, MSW, [place] Office: [phone] Cell: [phone] Fax: [phone]

## 2020-06-02 NOTE — Progress Notes (Signed)
PROGRESS NOTE   Subjective/Complaints: Wife says he was more restless, "off" yesterday. Feels better today. Up with therapy early this morning.  ROS: Limited due to cognitive/behavioral   Objective:   No results found. No results for input(s): WBC, HGB, HCT, PLT in the last 72 hours. No results for input(s): NA, K, CL, CO2, GLUCOSE, BUN, CREATININE, CALCIUM in the last 72 hours.  Intake/Output Summary (Last 24 hours) at 06/02/2020 1122 Last data filed at 06/02/2020 0723 Gross per 24 hour  Intake 1200 ml  Output --  Net 1200 ml        Physical Exam: Vital Signs Blood pressure (!) 141/86, pulse 79, temperature 98.3 F (36.8 C), temperature source Oral, resp. rate 20, height 6' (1.829 m), weight 124.5 kg, SpO2 100 %.  Constitutional: No distress . Vital signs reviewed. HEENT: EOMI, oral membranes moist Neck: supple Cardiovascular: RRR without murmur. No JVD    Respiratory/Chest: CTA Bilaterally without wheezes or rales. Normal effort    GI/Abdomen: BS +, non-tender, non-distended Ext: no clubbing, cyanosis, or edema Psych: generally flat but cooperative  Skin: scalp incision CDI Neurologic: remains apraxic, more attentive and initiates more..  Pill rolling Tremor in RUE more noticeable, ?cogwheeling. Cranial nerves II through XII intact, motor strength is 5/5 in Left and 0/5 right deltoid, bicep, grip 1/5. Trace at best/5 right hip flexor, knee extensors, 1+ left hip flexor, KE--no changes. 0/5 ankle dorsiflexor and plantar flexor bilaterally. Continued extensor tone present RLE>LLE---no change Musculoskeletal: mild bilateral shoulder discomfort with ROM      Assessment/Plan: 1. Functional deficits which require 3+ hours per day of interdisciplinary therapy in a comprehensive inpatient rehab setting.  Physiatrist is providing close team supervision and 24 hour management of active medical problems listed  below.  Physiatrist and rehab team continue to assess barriers to discharge/monitor patient progress toward functional and medical goals  Care Tool:  Bathing    Body parts bathed by patient: Left arm,Chest,Abdomen,Front perineal area,Face   Body parts bathed by helper: Right arm,Buttocks,Right upper leg,Left upper leg,Right lower leg,Left lower leg     Bathing assist Assist Level: Maximal Assistance - Patient 24 - 49%     Upper Body Dressing/Undressing Upper body dressing   What is the patient wearing?: Pull over shirt    Upper body assist Assist Level: Moderate Assistance - Patient 50 - 74%    Lower Body Dressing/Undressing Lower body dressing      What is the patient wearing?: Pants     Lower body assist Assist for lower body dressing: Maximal Assistance - Patient 25 - 49%     Toileting Toileting    Toileting assist Assist for toileting: 2 Helpers     Transfers Chair/bed transfer  Transfers assist  Chair/bed transfer activity did not occur: Safety/medical concerns  Chair/bed transfer assist level: Dependent - Patient 0% Chair/bed transfer assistive device:  Charlaine Dalton)   Locomotion Ambulation   Ambulation assist   Ambulation activity did not occur: Safety/medical concerns          Walk 10 feet activity   Assist  Walk 10 feet activity did not occur: Safety/medical concerns        Walk  50 feet activity   Assist Walk 50 feet with 2 turns activity did not occur: Safety/medical concerns         Walk 150 feet activity   Assist Walk 150 feet activity did not occur: Safety/medical concerns         Walk 10 feet on uneven surface  activity   Assist Walk 10 feet on uneven surfaces activity did not occur: Safety/medical concerns         Wheelchair     Assist Will patient use wheelchair at discharge?: Yes (TBD but likely) Type of Wheelchair: Power    Wheelchair assist level: Supervision/Verbal cueing Max wheelchair distance:  >100 ft    Wheelchair 50 feet with 2 turns activity    Assist    Wheelchair 50 feet with 2 turns activity did not occur: Safety/medical concerns   Assist Level: Supervision/Verbal cueing   Wheelchair 150 feet activity     Assist  Wheelchair 150 feet activity did not occur: Safety/medical concerns       Blood pressure (!) 141/86, pulse 79, temperature 98.3 F (36.8 C), temperature source Oral, resp. rate 20, height 6' (1.829 m), weight 124.5 kg, SpO2 100 %.    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   -may shower -ELOS/Goals:5/23, is making some gains but will likely need SNF  -Continue CIR therapies including PT, OT, and SLP     -path is positive for Grade 1 meningioma.     2. Left posterior tibial vein DVT:  Vascular ultrasound positive for age indeterminate left posterior tibial vein.   -continue xarelto. -antiplatelet therapy: N/A 3. Post-operative pain:tylenol prn  -voltaren gel for right shoulder---will rx left shoulder as well  -continue baclofen 10mg  qhs for cramps and to assist with sleep   4. Mood/sleep:  improved  -improved with scheduled trazodone 50mg ---continue  - rx pain, on baclofen too -antipsychotic agents: N/A  -generally improving-continue with trazodone and melatonin  -ritalin for activation and initiation  -limiting antispasmodics d/t neurosedation 5. Neuropsych: This patientiscapable of making decisions on hisown behalf. 6. Skin/Wound Care:rash resolved   -5/16 incisional drainage resolved 7. Fluids/Electrolytes/Nutrition:eating well     -Potassium level borderline---supplemented    5/13 K+ 3.6--recheck this week 8. Hypertension. Lasix 20 mg daily   -Restarted home Flomax HS which will also help with BP   -no  norvasc d/t swelling   5/2 lasix increased to 40mg  daily for elevated bp's   5/16 fair control    -continue metoprolol    -lasix at 20mg  daily   Vitals:   06/01/20 2005 06/02/20 0415  BP: (!) 165/95 (!) 141/86  Pulse: 80 79  Resp: 20 20  Temp: 98.3 F (36.8 C) 98.3 F (36.8 C)  SpO2: 96% 100%   9. Hyperlipidemia. Lipitor 10. BPH: increased Flomax to 0.8mg  HS 11. Urinary frequency: mostly incontinent  -UA negative.  timed voiding q2H while awake with urinal- placed nursing order   -continue Flomax to 0.8mg  given continued symptoms and HTN 12. Leukocytosis. Resolved off decadron 13. Constipation:  -continue senna-s bid  -moving bowels regularly now         LOS: 31 days A FACE TO [place] 06/02/2020, 11:22 AM

## 2020-06-02 NOTE — Progress Notes (Signed)
Physical Therapy Session Note  Patient Details  Name: Anthony Downs MRN: [id number] Date of Birth: November 09, 1950  Today's Date: 06/02/2020 PT Individual Time: 1405-1505 PT Individual Time Calculation (min): 60 min   Short Term Goals: Week 4:  PT Short Term Goal 1 (Week 4): Patient will initiate dependent gait. PT Short Term Goal 2 (Week 4): Patient will perform sit to/from stand with max A of 1 person using LRAD. PT Short Term Goal 3 (Week 4): Patient will perform level transfers with mod A using LRAD.  Skilled Therapeutic Interventions/Progress Updates: Pt present supine in bed and agreeable to therapy.  Pt required mod A for sup to sit transfer.  Pt able to sit EOB w/ 1 LOB to place feet on Stedy.  Pt requires mod A to pull self up to [facility] after placing L hand on support.  Pt stood in [place] x 1' and then transferred to w/c.  Pt requires increased time to process stand to sit transfer.  Pt negotiated PWC to [employer] w/ supervision.  Pt performed seated "volleyball" w/ LUE only w/o trunk support.  Pt performed peg board diagrams to increase forward lean outside of BOS.  Pt returned to room w/ supervision, including through doorway.  Pt stood w/ mod A to [place] and returned to bed.  Pt able to stand from [facility] seat w/ min A and then required total A to doff shorts in standing.  Pt able to pull shirt over head and then assists to remove arms.  Pt requires assist of 2 for sit to sidelying and the rolls to supine.  LE splints donned w/ spouse assist.  Bed alarm on and all needs in reach.     Therapy Documentation Precautions:  Precautions Precautions: Fall,Other (comment) Precaution Comments: Strong BLE extensor tone, hx of R hand tremor Restrictions Weight Bearing Restrictions: No General:   Vital Signs: Therapy Vitals Temp: 98.1 F (36.7 C) Temp Source: Oral Pulse Rate: 79 Resp: 16 BP: (!) 165/81 Patient Position (if appropriate): Lying Oxygen Therapy SpO2: 95 % O2 Device: Room  Air Pain: pt states no pain.      Therapy/Group: Individual Therapy  Ladoris Gene 06/02/2020, 3:39 PM

## 2020-06-02 NOTE — Progress Notes (Signed)
Physical Therapy Session Note  Patient Details  Name: Anthony Downs MRN: [id number] Date of Birth: 1950/05/15  Today's Date: 06/02/2020 PT Individual Time: 0910-1020 PT Individual Time Calculation (min): 70 min   Short Term Goals: Week 4:  PT Short Term Goal 1 (Week 4): Patient will initiate dependent gait. PT Short Term Goal 2 (Week 4): Patient will perform sit to/from stand with max A of 1 person using LRAD. PT Short Term Goal 3 (Week 4): Patient will perform level transfers with mod A using LRAD.  Skilled Therapeutic Interventions/Progress Updates:     Patient in power w/c asleep with his wife in the room upon PT arrival. Patient slowly aroused to verbal stimuli and agreeable to PT session. Patient denied pain during session. Reports that he slept well, but feels fatigue after OT session this morning.   Therapeutic Activity: Bed Mobility: Patient performed sit to supine with max A for lower extremity managment. Provided verbal cues for use of bottom elbow to control trunk and bringing knees to chest to lift lower extremities onto the bed. Transfers: Patient performed Stedy transfers power w/c<>mat table and power w/c>bed with min A of 1-2 to stand. Provided verbal cues for R hand placement with facilitation, forward weight shift, and hip extension to come to standing.  Neuromuscular Re-ed: Patient performed the following activities: -sat EOM with supervision >5 min x2 to don Lite Gait harness and re-adjust harness a second time, performed multiple forward and lateral leans for harness placement with cues -sit to/from stand in Lite Gait x3 with cues for gluteal and quad activation to push to standing, patient uncomfortable with harness placement x2 trials, adjusted harness in standing >2 min and sitting -ambulated 22 feet using Lite Gait with ~30% BWS with B upper extremities on handles. Ambulated with max A for L limb advancement with increased hip external rotation and total A for  R limb advancement. Provided verbal cues for gluteal activation in stance phase and use of Lite gait for forward propulsion with stepping; activity successful for promoting reciprocal patterning with improved sequencing and rhythm with repetition, limited distance due to patient reporting harness discomfort and fatigue  Patient was continent of bladder in the bed with total A for urinal placement.   Discussed d/c planning with patient and his wife. Currently patient requires assist of 2 people for transfers for safety and is currently only functional for locomotion in a power wheelchair. His wife said that she has a ramp planned to be put in, discussed concerns about transporting a power wheelchair and she said that their church has a w/c Anthony Downs they could borrow for appointments. She reports she has been bathing and dressing him bed level every morning for the past few days without assistance. She is looking into purchasing a Stedy as well, which he has progressed to using in the past week. Will discuss with rehab team in conference tomorrow about d/c home vs SNF.   Patient in bed with his wife at bedside at end of session with breaks locked, bed alarm set, and all needs within reach.    Therapy Documentation Precautions:  Precautions Precautions: Fall,Other (comment) Precaution Comments: Strong BLE extensor tone, hx of R hand tremor Restrictions Weight Bearing Restrictions: No   Therapy/Group: Individual Therapy  Anthony Downs L Derl Abalos PT, DPT  06/02/2020, 5:12 PM

## 2020-06-02 NOTE — Progress Notes (Signed)
Occupational Therapy Session Note  Patient Details  Name: Anthony Downs MRN: [id number] Date of Birth: 18-Oct-1950  Today's Date: 06/02/2020 OT Individual Time: 5697-9480 OT Individual Time Calculation (min): 56 min    Skilled Therapeutic Interventions/Progress Updates:    Pt greeted in bed, awake and alert with no c/o pain. His wife Anthony Downs was present, just helped him with bathing. Pt reported his brief was soiled. Hands on caregiver education with functional bed mobility during pericare/brief change. We discussed timed toileting at home, spouse reports that she is going to buy a Stedy, will need assistance from therapy in regards to purchasing the correct Stedy, note that pt uses a bariatric Stedy. Afterwards, pt agreeable to proceed with dressing tasks. Mod A for supine<sit with pt having no activation in the Rt LE. Pt requiring close supervision-CGA for sitting balance while OT assisted with footwear and threading LEs into pants. Pt able to lift his Lt LE against gravity and pull pants up from calf to thigh with cuing. Mod A of 1 for sit<stand in [place] with increased time for pt to initiate/execute stand. Once pants were pulled up, pt had a very tough time motor planning sitting down. Spouse cued him to touch her forehead to facilitate bending forward. Tactile and manual cues for knee flexion to assist as well. While semi perched in [place], pt completed oral care with Mod A due to tremulous + nonfunctional Rt UE. Used the mirror to cue pt to correct his Rt lean, had him touch his Lt shoulder with OT's to improve midline. Afterwards pt transferred to the [facility], +2 for scooting him back fully. Used knee supports to prevent knee splaying, tilted him back for comfort. He then maneuvered PWC towards the bedside given min cuing and increased time. Pt remained sitting up in the [place], tilted back for comfort and wearing his seat belt. Tx focus placed on trunk control, caregiver education, ADL retraining, Rt  UE/B LE weightbearing using Stedy, and sitting/standing balance.   Therapy Documentation Precautions:  Precautions Precautions: Fall,Other (comment) Precaution Comments: Strong BLE extensor tone, hx of R hand tremor Restrictions Weight Bearing Restrictions: No ADL: ADL Grooming: Moderate assistance (per pt report) Where Assessed-Grooming: Bed level Upper Body Bathing: Moderate assistance Where Assessed-Upper Body Bathing: Bed level Lower Body Bathing: Dependent (+2 rolling) Where Assessed-Lower Body Bathing: Bed level Upper Body Dressing: Dependent (+2 for rolling to pull down back) Where Assessed-Upper Body Dressing: Bed level Lower Body Dressing: Dependent Where Assessed-Lower Body Dressing: Bed level Toileting: Unable to assess   Therapy/Group: Individual Therapy  [name] A Kerry-Anne Mezo 06/02/2020, 12:30 PM

## 2020-06-03 NOTE — Progress Notes (Signed)
PROGRESS NOTE   Subjective/Complaints: Had a good morning with therapy. Transfers getting better. Wife wants to bring him home  ROS: Limited due to cognitive/behavioral   Objective:   No results found. No results for input(s): WBC, HGB, HCT, PLT in the last 72 hours. No results for input(s): NA, K, CL, CO2, GLUCOSE, BUN, CREATININE, CALCIUM in the last 72 hours.  Intake/Output Summary (Last 24 hours) at 06/03/2020 1031 Last data filed at 06/03/2020 0817 Gross per 24 hour  Intake 660 ml  Output 200 ml  Net 460 ml        Physical Exam: Vital Signs Blood pressure (!) 149/90, pulse 76, temperature 97.8 F (36.6 C), temperature source Oral, resp. rate 16, height 6' (1.829 m), weight 124.5 kg, SpO2 98 %.  Constitutional: No distress . Vital signs reviewed. HEENT: EOMI, oral membranes moist Neck: supple Cardiovascular: RRR without murmur. No JVD    Respiratory/Chest: CTA Bilaterally without wheezes or rales. Normal effort    GI/Abdomen: BS +, non-tender, non-distended Ext: no clubbing, cyanosis, or edema Psych: pleasant and cooperative Skin: scalp incision CDI Neurologic: apraxic but initiates better. Moving well.  Cranial nerves II through XII intact, motor strength is 5/5 in Left and 0/5 right deltoid, bicep, grip 1/5. Trace at best/5 right hip flexor, knee extensors, 1+ left hip flexor, KE--no changes. 0/5 ankle dorsiflexor and plantar flexor bilaterally. Continued extensor tone present RLE>LLE. Musculoskeletal: mild bilateral shoulder discomfort with ROM      Assessment/Plan: 1. Functional deficits which require 3+ hours per day of interdisciplinary therapy in a comprehensive inpatient rehab setting.  Physiatrist is providing close team supervision and 24 hour management of active medical problems listed below.  Physiatrist and rehab team continue to assess barriers to discharge/monitor patient progress toward  functional and medical goals  Care Tool:  Bathing    Body parts bathed by patient: Left arm,Chest,Abdomen,Front perineal area,Face   Body parts bathed by helper: Right arm,Buttocks,Right upper leg,Left upper leg,Right lower leg,Left lower leg     Bathing assist Assist Level: Maximal Assistance - Patient 24 - 49%     Upper Body Dressing/Undressing Upper body dressing   What is the patient wearing?: Pull over shirt    Upper body assist Assist Level: Moderate Assistance - Patient 50 - 74%    Lower Body Dressing/Undressing Lower body dressing      What is the patient wearing?: Pants     Lower body assist Assist for lower body dressing: Maximal Assistance - Patient 25 - 49%     Toileting Toileting    Toileting assist Assist for toileting: 2 Helpers     Transfers Chair/bed transfer  Transfers assist  Chair/bed transfer activity did not occur: Safety/medical concerns  Chair/bed transfer assist level: Dependent - Patient 0% (Stedy) Chair/bed transfer assistive device: Other Customer service manager)   Locomotion Ambulation   Ambulation assist   Ambulation activity did not occur: Safety/medical concerns  Assist level: Dependent - Patient 0% Assistive device: Lite Gait Max distance: 22 ft   Walk 10 feet activity   Assist  Walk 10 feet activity did not occur: Safety/medical concerns  Assist level: Dependent - Patient 0% Assistive device: Lite Gait  Walk 50 feet activity   Assist Walk 50 feet with 2 turns activity did not occur: Safety/medical concerns         Walk 150 feet activity   Assist Walk 150 feet activity did not occur: Safety/medical concerns         Walk 10 feet on uneven surface  activity   Assist Walk 10 feet on uneven surfaces activity did not occur: Safety/medical concerns         Wheelchair     Assist Will patient use wheelchair at discharge?: Yes Type of Wheelchair: Power    Wheelchair assist level: Supervision/Verbal  cueing Max wheelchair distance: >100 ft    Wheelchair 50 feet with 2 turns activity    Assist    Wheelchair 50 feet with 2 turns activity did not occur: Safety/medical concerns   Assist Level: Supervision/Verbal cueing   Wheelchair 150 feet activity     Assist  Wheelchair 150 feet activity did not occur: Safety/medical concerns       Blood pressure (!) 149/90, pulse 76, temperature 97.8 F (36.6 C), temperature source Oral, resp. rate 16, height 6' (1.829 m), weight 124.5 kg, SpO2 98 %.    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   -may shower -ELOS/Goals:5/31, between equipment, help, improvement, will be able to go home  -Continue CIR therapies including PT, OT, and SLP     -path is positive for Grade 1 meningioma.     2. Left posterior tibial vein DVT:  Vascular ultrasound positive for age indeterminate left posterior tibial vein.   -continue xarelto. -antiplatelet therapy: N/A 3. Post-operative pain:tylenol prn  -voltaren gel for bilateral shoulder pain  -continue baclofen 10mg  qhs for cramps and to assist with sleep   4. Mood/sleep:  improved  -improved with scheduled trazodone 50mg ---continue  - rx pain, on baclofen too -antipsychotic agents: N/A  -generally improving-continue with trazodone and melatonin  -ritalin for activation and initiation  -limiting antispasmodics d/t neurosedation 5. Neuropsych: This patientiscapable of making decisions on hisown behalf. 6. Skin/Wound Care:rash resolved   -5/16 incisional drainage resolved 7. Fluids/Electrolytes/Nutrition:eating well     -Potassium level borderline---supplemented    5/13 K+ 3.6--recheck Thursday 8. Hypertension. Lasix 20 mg daily   -Restarted home Flomax HS which will also  help with BP   -no norvasc d/t swelling   5/2 lasix increased to 40mg  daily for elevated bp's   5/17 fair to borderline control    -continue metoprolol    -lasix at 20mg  daily    -no changes today Vitals:   06/02/20 2052 06/03/20 0326  BP: (!) 142/83 (!) 149/90  Pulse: 71 76  Resp: 18 16  Temp: 98.3 F (36.8 C) 97.8 F (36.6 C)  SpO2: 98% 98%   9. Hyperlipidemia. Lipitor 10. BPH: increased Flomax to 0.8mg  HS 11. Urinary frequency: mostly incontinent  -UA negative.  timed voiding q2H while awake with urinal- placed nursing order   -continue Flomax to 0.8mg  given continued symptoms and HTN 12. Leukocytosis. Resolved off decadron 13. Constipation:  -continue senna-s bid  -moving bowels regularly now         LOS: 32 days A FACE TO [place] 06/03/2020, 10:31 AM

## 2020-06-03 NOTE — Plan of Care (Signed)
  Problem: RH Car Transfers Goal: LTG Patient will perform car transfers with assist (PT) Description: LTG: Patient will perform car transfers with assistance (PT). Outcome: Not Applicable Flowsheets (Taken 06/03/2020 0626) LTG: Pt will perform car transfers with assist:: (Family to use church's wheelchair [name] for power wheelchair at d/c.) -- Note: Family to use church's wheelchair van for power wheelchair at d/c.   Problem: RH Balance Goal: LTG Patient will maintain dynamic standing balance (PT) Description: LTG:  Patient will maintain dynamic standing balance with assistance during mobility activities (PT) Flowsheets (Taken 06/03/2020 0626) LTG: Pt will maintain dynamic standing balance during mobility activities with:: (in Stedy to simulate home standing. downgraded goal due to slow progress secondary to limited motor return.) Dependent - Patient equals 0% Note: downgraded goal due to slow progress secondary to limited motor return.   Problem: Sit to Stand Goal: LTG:  Patient will perform sit to stand with assistance level (PT) Description: LTG:  Patient will perform sit to stand with assistance level (PT) Flowsheets (Taken 06/03/2020 0626) LTG: PT will perform sit to stand in preparation for functional mobility with assistance level: (in [place] for reduced caregiver burden at d/c. downgraded goal due to slow progress secondary to limited motor return.) Minimal Assistance - Patient > 75% Note: downgraded goal due to slow progress secondary to limited motor return.   Problem: RH Bed to Chair Transfers Goal: LTG Patient will perform bed/chair transfers w/assist (PT) Description: LTG: Patient will perform bed to chair transfers with assistance (PT). Flowsheets (Taken 06/03/2020 [phone]) LTG: Pt will perform Bed to Chair Transfers with assistance level: (in [place] for reduced caregiver burden at d/c. downgraded goal due to slow progress secondary to limited motor return.) -- Note: downgraded goal  due to slow progress secondary to limited motor return.   Problem: RH Ambulation Goal: LTG Patient will ambulate in controlled environment (PT) Description: LTG: Patient will ambulate in a controlled environment, # of feet with assistance (PT). Flowsheets (Taken 06/03/2020 0626) LTG: Pt will ambulate in controlled environ  assist needed:: Dependent - Patient equals 0% LTG: Ambulation distance in controlled environment: (downgraded goal due to slow progress secondary to limited motor return.) 50 feet  for therapeutic gait training for improved activity tolerance and lower extremity NMR Note: downgraded goal due to slow progress secondary to limited motor return.   Problem: RH Wheelchair Mobility Goal: LTG Patient will propel w/c in controlled environment (PT) Description: LTG: Patient will propel wheelchair in controlled environment, # of feet with assist (PT) Flowsheets (Taken 06/03/2020 0626) LTG: Pt will propel w/c in controlled environ  assist needed:: Supervision/Verbal cueing LTG: Propel w/c distance in controlled environment: (With power wheelchair.) 150 ft Goal: LTG Patient will propel w/c in home environment (PT) Description: LTG: Patient will propel wheelchair in home environment, # of feet with assistance (PT). Flowsheets (Taken 06/03/2020 0626) LTG: Pt will propel w/c in home environ  assist needed:: (in power wheelchair) Supervision/Verbal cueing LTG: Propel w/c distance in home environment: (in power wheelchair) 50 ft

## 2020-06-03 NOTE — Progress Notes (Signed)
Occupational Therapy Session Note  Patient Details  Name: Anthony Downs MRN: [id number] Date of Birth: November 04, 1950  Today's Date: 06/03/2020 OT Individual Time: 1300-1400 OT Individual Time Calculation (min): 60 min    Short Term Goals: Week 1:  OT Short Term Goal 1 (Week 1): Pt will roll with MAX A of 1 caregiver to decrease BOC for toileitng OT Short Term Goal 1 - Progress (Week 1): Met OT Short Term Goal 2 (Week 1): Pt will tolerate laying sidelying on R 1x per day to assist with tone management OT Short Term Goal 2 - Progress (Week 1): Met OT Short Term Goal 3 (Week 1): Pt will recall hemi dressing techniques with MOD VC OT Short Term Goal 3 - Progress (Week 1): Progressing toward goal OT Short Term Goal 4 (Week 1): Pt will sit EOB 10 min with mod A in prep for ADL OT Short Term Goal 4 - Progress (Week 1): Met Week 2:  OT Short Term Goal 1 (Week 2): Pt will maintain dynamic sitting balnace in min ranges outside BOS with MIN A overall for 5 min OT Short Term Goal 1 - Progress (Week 2): Met OT Short Term Goal 2 (Week 2): Pt will recall hemi dressing techniques wiht MOD VC OT Short Term Goal 2 - Progress (Week 2): Met OT Short Term Goal 3 (Week 2): Pt will complete 2 steps of UB dressing OT Short Term Goal 3 - Progress (Week 2): Progressing toward goal OT Short Term Goal 4 (Week 2): Pt will groom with MIN A OT Short Term Goal 4 - Progress (Week 2): Met Week 3:  OT Short Term Goal 1 (Week 3): Pt will complete 2/4 steps of UB dressing OT Short Term Goal 1 - Progress (Week 3): Met OT Short Term Goal 2 (Week 3): pt will mange trunk from supine>sit with MOD A consistently OT Short Term Goal 2 - Progress (Week 3): Progressing toward goal OT Short Term Goal 3 (Week 3): Pt will use RUE as stabilizer with MIN A consistently OT Short Term Goal 3 - Progress (Week 3): Met  Skilled Therapeutic Interventions/Progress Updates:    Patient in bed, fatigued but willing to participate.  Wife and  brother present for session.  Patient denies pain.  Supine to side lying left with mod A, side lying to sitting edge of bed with mod A - reviewed repositioning strategies with patient and wife - good understanding demonstrated.  Unsupported sitting with CS - completed trunk mobility activities, right AAROM, inhibition of flexors, weight bearing activities, facilitation of extensors, attempted hip/knee activation on right.  Returned to supine mod A, he is able to help pull to head of bed with rails.  Applied hand paddle right hand  - returned to sitting edge of bed, completed additional facilitation/inhibition activities with good results.  Sit to stand in stedy x2 with min A of 2, cues for flexed posture to return to sitting.  Returned to supine mod A.  Completed AAROM/stretch and inhibition in supine position, reviewed positioning with wife.  Patient remained in bed at close of session, bed alarm set and callbell in hand.    Therapy Documentation Precautions:  Precautions Precautions: Fall,Other (comment) Precaution Comments: Strong BLE extensor tone, hx of R hand tremor Restrictions Weight Bearing Restrictions: No   Therapy/Group: Individual Therapy  Carlos Levering 06/03/2020, 7:55 AM

## 2020-06-03 NOTE — Progress Notes (Signed)
Patient ID: Anthony Downs, male   DOB: 1950-12-17, 70 y.o.   MRN: [id number]  SW met with pt, pt wife, and pt brother in room to provide updates from team conference in which pt d/c date extended to 5/31 to allow for further family education since pt will discharge to home. Pt and family very happy about extension. Wife confirms PT reports on ramp will be expedited as of tomorrow, and she is working on getting help from others for them both when they go home. SW provided [facility] list and will follow-up once there are all final DME recommendations.   Loralee Pacas, MSW, [place] Office: [phone] Cell: [phone] Fax: [phone]

## 2020-06-03 NOTE — Progress Notes (Signed)
Physical Therapy Session Note  Patient Details  Name: Anthony Downs MRN: [id number] Date of Birth: [birth year]  Today's Date: 06/03/2020 PT Individual Time: 4332-9518 and 8416-6063 PT Individual Time Calculation (min): 60 min and 70 min   Short Term Goals: Week 4:  PT Short Term Goal 1 (Week 4): Patient will initiate dependent gait. PT Short Term Goal 2 (Week 4): Patient will perform sit to/from stand with max A of 1 person using LRAD. PT Short Term Goal 3 (Week 4): Patient will perform level transfers with mod A using LRAD.  Skilled Therapeutic Interventions/Progress Updates:     Session 1: Patient in bed with his wife in the room upon PT arrival. Patient alert and agreeable to PT session. Patient denied pain during session.  Discussed d/c planning with patient and his wife. Confirmed that ramp would be in place prior to d/c, power w/c transport options being investigated, and discussed doorway access with w/c. Patient's wife reports 25" bedroom doorway, provided options for widening the doorway, which she reports is able to be accommodated.  Patient's wife reported that she performed bathing, dressing, and brief change this morning without assistance from staff with patient assisting with mobility with cues. Performed hands on training with patient's wife with bed mobility and Stedy transfers.   Therapeutic Activity: Bed Mobility: Patient performed supine to/from sit with mod A for lower extremity management and min A for trunk management while performing log roll technique to the L. Provided verbal cues for bringing R shoulder over, looking down to the L, use of L elbow to manage trunk control, and bringing knees to chest to bring his legs on/off the bed. Transfers: Patient performed dependent Stedy transfers bed<>power w/c with mod-min A to stand with PT demonstrating assist technique. Patient's wife maneuvered the Stedy to transfer patient and followed cues for placement for  positioning in wheelchair or bed and use of breaks to feel the effort involved in assisting with transfers with this device. Reported that it was easier than she expected. Stated that the floors in the bedroom are wood, which will allow for smoother transfers using a Stedy at home. He performed sit to/from stand x2 with bariatric RW with mod A of 1 person and a second person to stabilize RW. Provided verbal cues for forward weight shift, gluteal activation for hip extension, and facilitation at the hip and shoulder to initiate sitting and reduced trunk and lower extremity extensor tone.  Gait Training:  Patient ambulated 35 feet in the Lite Gait with minimal BWS due to pinching at his groin from the sling. Ambulated with max-total A for B foot advancement with use of forward propulsion of the Lite Gait to promote stepping motion. Patient with increased difficulty with foot clearance without BWS and reduced limb extension in stance on R. Provided verbal cues for sequencing and facilitation for gluteal activation in stance to promote hip and knee extension.  Wheelchair Mobility:  Patient propelled wheelchair >150 feet with supervision. Provided verbal cues for steering and assist to increase speed x1. Patient required cues to attend to task and maintain arousal x2.  Patient in bed with his wife at bedside at end of session with breaks locked, bed alarm set, and all needs within reach.   Session 2: Patient in bed with his wife in the room upon PT arrival. Patient alert and agreeable to PT session. Patient denied pain during session.  Patient and his wife appreciative of team decision to change POC to d/c home for  improved access to equipment needs and due to patient's progress to reduce caregiver burden.    Patient's wife reports that the church's wheelchair accessible [name] is broken and will not be available at d/c. Discussed possible hospital transport home, looking into w/c van day rentals, use of a  trailer hitch for the w/c (but need for patient to be successful with car transfers), or discuss public transit options for transport to medical appointments.  Neuromuscular Re-ed: Patient performed the following activities for improved motor control with functional activities: -bridging x5 while using L hand to pull up shorts with facilitation and approximation at his R knee and foot to lift his hips, patient able to pull shorts up on the L and partially on the R during activity -rolling R x2 without use of bed rail with facilitation and shoulder and posterior thigh to promote forward rotation of L body (min A) -pushing up to R elbow from R side-lying facilitating at his L hip and R ribs to create arch in his trunk to use his R arm to push up to his elbow then return to lying x2 (mod A) -pushing up to sitting from R elbow x1 with approximation through his R hand and facilitation at L hip and cues for looking at the bed to bring his head and shoulder forward for improved leverage with coming to sitting (min-mod A) -sitting balance reaching to his brother on the L and therapist on the R (facilitating R hand reach with hand-over-hand assist and ~25-50% activation with elbow extension, improved with abductor activation) (CGA) -sitting balance trunk leans in all directions in circular motion increased range outside ov BOS for improved balance challenge (min A) -reaching to pick up a sock on the floor x2 for significant forward trunk flexion out of extensor pattern, able to then reach down with his L hand holding the sock and don sock 25% onto his R foot with mod-max A for dressing from therapist (min A for trunk support)  -sit to/from stand x2 using bariatric RW with mod a from 1 person and a second person blocking RW; focused on controlled ascent and descent and initiation of forward trunk lean, lower extremity extension to stand, weight shifting in standing 30-60 sec, and initiation of sit with facilitation  at his R shoulder and hip to break extensor tone  Patient in bed with his wife and brother in the room at end of session with breaks locked, bed alarm set, B DF night splints donned, and all needs within reach.    Therapy Documentation Precautions:  Precautions Precautions: Fall,Other (comment) Precaution Comments: Strong BLE extensor tone, hx of R hand tremor Restrictions Weight Bearing Restrictions: No   Therapy/Group: Individual Therapy  Mabry Tift L Matylda Fehring PT, DPT  06/03/2020, 4:05 PM

## 2020-06-04 NOTE — Progress Notes (Signed)
Physical Therapy Session Note  Patient Details  Name: Anthony Downs MRN: [id number] Date of Birth: Apr 07, 1950  Today's Date: 06/04/2020 PT Individual Time: 1020-1100 PT Individual Time Calculation (min): 40 min   Short Term Goals: Week 4:  PT Short Term Goal 1 (Week 4): Patient will initiate dependent gait. PT Short Term Goal 2 (Week 4): Patient will perform sit to/from stand with max A of 1 person using LRAD. PT Short Term Goal 3 (Week 4): Patient will perform level transfers with mod A using LRAD.  Skilled Therapeutic Interventions/Progress Updates:     Pt received supine in bed and agrees to therapy. No complaint of pain. Pt with soiled brief and wife present to assist with changing into clean brief. PT provides education on body mechanics and safe handling of pt. Pt rolls bilaterally in bed, requiring minA to roll to the L and modA to maxA to roll to the R. Pt able to reach across body and grab rail with R hand but requires increased time and cueing to complete. Following brief change and donning clean shorts, pt performs supine to sit with modA and elevated HOB. Pt's wife provides assistance to pt to perform sit to stand in [place], with PT providing cueing for safe management and positioning. Pt transfers to power WC with Stedy and wife assisting. Pt then cued to drive WC to gym. Pt is able to maneuver power WC into hallway but then becomes lethargic and/or distracted and requires PT to maneuver WC for time management. Pt then performs sit to stand training and standing balance training in hallway with handrail on L side. Pt stands with x2 bouts with modA +2 and able to maintain standing with modA +1. Pt practices lateral weight shifting and focus on bilateral hip extension with verbal and tactile cues. Return to room via power WC. Pt performs sit to stand in stedy with supervision and cues on hand placement. Transfer to bed and sit to supine with modA. Left with alarm intact and all needs  within reach.  Therapy Documentation Precautions:  Precautions Precautions: Fall,Other (comment) Precaution Comments: Strong BLE extensor tone, hx of R hand tremor Restrictions Weight Bearing Restrictions: No    Therapy/Group: Individual Therapy  Breck Coons, PT, DPT 06/04/2020, 12:34 PM

## 2020-06-04 NOTE — Progress Notes (Addendum)
Patient ID: VIRAL SCHRAMM, male   DOB: 14-May-1950, 70 y.o.   MRN: [id number]  SW sent e-fax for pt demographics to [facility] so he can verify insurance for w/c eval tomorrow.  *SW confirmed with [facility] that demo sheet received.   SW ordered fully electric hospital bed with [facility] via [employer].   SW left message for pt wife [name] [phone]) to inform on above and explain process of hospital beds being rentals. SW encouraged follow-up with SW if any questions.  *SW spoke with pt wife on above. HHA preference is [facility] and would like to work with Tressia Danas if possible.   SW sent referral for HHPT/OT/SLP/aide/?SN to [facility] and waiting on follow-up.  *referral accepted.   Loralee Pacas, MSW, [facility] Office: [phone] Cell: [phone] Fax: [phone]

## 2020-06-04 NOTE — Progress Notes (Addendum)
Physical Therapy Session Note  Patient Details  Name: Anthony Downs MRN: [id number] Date of Birth: Jun 24, 1950  Today's Date: 06/04/2020 PT Individual Time: 1418-1530 PT Individual Time Calculation (min): 72 min   Short Term Goals: Week 4:  PT Short Term Goal 1 (Week 4): Patient will initiate dependent gait. PT Short Term Goal 2 (Week 4): Patient will perform sit to/from stand with max A of 1 person using LRAD. PT Short Term Goal 3 (Week 4): Patient will perform level transfers with mod A using LRAD.  Skilled Therapeutic Interventions/Progress Updates:     Patient in bed with his wife in the room upon PT arrival. Patient alert and agreeable to PT session. Patient denied pain during session. Lattie Haw, RT present during session for co-treat.  Therapeutic Activity: Bed Mobility: Donned shorts with max a with patient performing bridging, as below, to pull pants up on L side and pulled up R side with total A in L side-lying during ed mobility. Donned/doffed B TED hose and non-skid socks total A bed level. Patient performed supine to sit with mod A and sit to supine with max A for lower extremity management in a flat bed without use of bed rail. Provided verbal cues for bringing knees to chest to bring his legs on/off the bed and use of L elbow for trunk control with manual facilitation and bottom ribs and hip to sit up. Transfers: Patient performed sit to stand with bariatric RW with mod A of 1 person and a second providing steady assist. Attempted to progress to step/pivot transfer to power w/c, however patient unable to initiate stepping with poor L weight shift and decreased motor planning. Attempted slide board transfer bed>power w/c as alternative transfer method. Patient with significant difficulty with trunk lean to off-weight his hips. Able to place L eblow to bed for board placement with total A, but unable to coordinate off-weighting his hips and pushing through his lower  extremities. Performed Stedy transfers bed>power w/c and mat table>bed with longer transfer through the halls with good trunk control. Required min A for sit/to stands from low surface and CGA from [facility] seat. Provided cues and manual facilitation for forward trunk lean and weight shift and gluteal activation to boost up to standing, also for hip and trunk flexion to initiate sitting.   Wheelchair Mobility:  Patient propelled power wheelchair >100 feet with supervision. Provided verbal cues for increasing propulsion speed on the hand controls and increased hand movement to increased speed of propulsion at the joystick.  Neuromuscular Re-ed: Patient performed the following activities: -bridging x2 with 3-4 sec hold while using L hand to pull up shorts with facilitation and approximation at his R knee and foot to lift his hips, patient able to pull shorts up on the L and partially on the R during activity -lateral leans to elbow and reaching with opposite arm cross body for trunk flexion and rotation x1 each side with increased time and encouragement to progress to full range of motion, patient reported fear of falling with activity, however, no LOB or risk of falling, provided education on safety and building confidence with mobility and increased strength, +2 assist for safety -performed R arm and hand extension stretching throughout session and focused on patient opening his hand and lifting it from [place] -cued patient to initiated lifting his feet on/off the Stedy with intermittent success on L and no activation on R, but encouraged visualization of activity for cortical activation  Patient in bed at end of  session with breaks locked, bed alarm set, and all needs within reach.    Therapy Documentation Precautions:  Precautions Precautions: Fall,Other (comment) Precaution Comments: Strong BLE extensor tone, hx of R hand tremor Restrictions Weight Bearing Restrictions: No   Therapy/Group:  Individual Therapy  Damisha Wolff L Minela [place] PT, DPT  06/04/2020, 5:13 PM

## 2020-06-04 NOTE — Patient Care Conference (Signed)
Inpatient RehabilitationTeam Conference and Plan of Care Update Date: 06/03/2020   Time: 10:14 AM    Patient Name: Anthony Downs      Medical Record Number: [id number]  Date of Birth: 09/03/1950 Sex: Male         Room/Bed: 4W18C/4W18C-01 Payor Info: Payor: MEDICARE / Plan: MEDICARE PART A AND B / Product Type: *No Product type* /    Admit Date/Time:  05/02/2020  5:12 PM  Primary Diagnosis:  Brain tumor [facility])  Hospital Problems: Principal Problem:   Brain tumor [facility]) Active Problems:   Meningioma [facility])    Expected Discharge Date: Expected Discharge Date: 06/17/20  Team Members Present: Physician leading conference: Dr. Alger Simons Care Coodinator Present: Dorthula Nettles, RN, BSN, CRRN;Loralee Pacas, LCSWA Nurse Present: Dorthula Nettles, RN PT Present: Apolinar Junes, PT OT Present: Cherylynn Ridges, OT PPS Coordinator present : Gunnar Fusi, SLP     Current Status/Progress Goal Weekly Team Focus  Bowel/Bladder   Incont of B/B. LBM 06/01/20  Pt gain cont. of B/B  Toliet pt q2h and PRN   Swallow/Nutrition/ Hydration             ADL's   Mod A UB ADLs, Total A LB ADLs, Improved grasp,  Mod-max A overall  pt/family education, dc planning, self-care retraining, R UE NMR, bed mobility, transfers   Mobility   Mod-min A rolling and supine-sit, max A sit to supine, mod A Stedy transfers +1-2, supervision in power w/c, initiated therapeutic gait in Lite Gait on 5/16 22 ft  Mod-min A bed mobility and Stedy transfers, supervision power mobility  Activity tolerance, standing tolerance/balance, Stedy transfers, NMR of extremities, therapuetic gait training, midline orientation, patient/caregiver education   Communication             Safety/Cognition/ Behavioral Observations            Pain   Pt states no pain  Pt remains pain free  Assess for pain qshift and provide PRN medication as needed   Skin   Incision on head from post craniotomy.  Insicision remains clean and  intact free of s/s of infection  assess skin qshift and provide care     Discharge Planning:  ? SNF vs HH. If pt to d/c to home, pt to have 24/7 care from wife. Discussion on possible placement has been discussed. Fam edu on 5/11 10am-3pm to help determine care needs.   Team Discussion: Wounds are healing, added Ritalin, eating well, BP up. Continent with incontinent episodes. Has pain with mobility. Does get agitated and restless at night, has melatonin and ativan prn. Incision to scalp now covered with foam dressing. Has people that are able to assist spouse out. Spouse is looking into getting a steady and ramp can be built for power wheel chair. Patient on target to meet rehab goals: yes, mod assist for upper body ADL's, total assist for lower body ADL's, grasp has improved. Mod/max assist overall goal. Mod/min assist rolling, mod assist +2 steady transfers, supervision in power wheelchair.  *See Care Plan and progress notes for long and short-term goals.   Revisions to Treatment Plan:  Not at this time.  Teaching Needs: Family education, medication management, pain management, skin/wound care, transfer training, steady training, balance training, endurance training, safety awareness.  Current Barriers to Discharge: Decreased caregiver support, Medical stability, Home enviroment access/layout, Incontinence, Wound care, Lack of/limited family support, Medication compliance, Behavior and Nutritional means  Possible Resolutions to Barriers: Continue current medications, provide emotional support.  Medical Summary Current Status: scalp wound healing. sleeping a little better but inconsistent. eating well. some neuro progress but slow  Barriers to Discharge: Medical stability   Possible Resolutions to [employer] Focus: daily assessment of labs, pt data   Continued Need for Acute Rehabilitation Level of Care: The patient requires daily medical management by a physician with  specialized training in physical medicine and rehabilitation for the following reasons: Direction of a multidisciplinary physical rehabilitation program to maximize functional independence : Yes Medical management of patient stability for increased activity during participation in an intensive rehabilitation regime.: Yes Analysis of laboratory values and/or radiology reports with any subsequent need for medication adjustment and/or medical intervention. : Yes   I attest that I was present, lead the team conference, and concur with the assessment and plan of the team.   Cristi Loron 06/04/2020, 6:16 PM

## 2020-06-04 NOTE — Progress Notes (Signed)
Recreational Therapy Session Note  Patient Details  Name: DURWARD MATRANGA MRN: [id number] Date of Birth: Jan 16, 1951 Today's Date: 06/04/2020  Pain: no c/o Skilled Therapeutic Interventions/Progress Updates: Session focused on activity tolerance, sit-stands in stedy, attempted slide board transfer and dynamic sitting balance EOM during co-treat with PT.  Pt required min assist for sit-stand in the stedy, instructional cues.  Attempted slide board transfer but pt unable to assist.  Utilized stedy for transferring into power w/c.  Pt utilized power w/c from his room to the dayroom with supervision.  Once in the dayroom, pt sat EOM performing lateral leans on to elbows, alternately while reaching across body with oppisite arm for trunk rotation, stretching. With min-mod assist.  Pt with fear of falling in this position, so +2 available with this task.  Therapy/Group: Co-Treatment  Nanie Dunkleberger 06/04/2020, 4:12 PM

## 2020-06-04 NOTE — Progress Notes (Signed)
Occupational Therapy Session Note  Patient Details  Name: Anthony Downs MRN: [id number] Date of Birth: [birth year]  Today's Date: 06/04/2020 OT Individual Time: 0900-1000 OT Individual Time Calculation (min): 60 min    Today's Date: 06/04/2020 OT Individual Time: 3887-1959 OT Individual Time Calculation (min): 30 min   Short Term Goals: Week 4:  OT Short Term Goal 1 (Week 4): Pt will manage trunk Sup>sit wiht MOD A consistently OT Short Term Goal 1 - Progress (Week 4): Met OT Short Term Goal 2 (Week 4): Pt will tolerate trialing toileting OOB in sara + daily OT Short Term Goal 2 - Progress (Week 4): Met OT Short Term Goal 3 (Week 4): Pt will don shirt with no more than MIN A for sitting balnace on EOB OT Short Term Goal 3 - Progress (Week 4): Met OT Short Term Goal 4 (Week 4): pt will thread 1LE into pants OT Short Term Goal 4 - Progress (Week 4): Progressing toward goal  Skilled Therapeutic Interventions/Progress Updates:     Session 1: 1:1. Pt received in bed ready to go with no pain. Pt completes bed mobility with MAX A of 1 for components but MOD A overall for effort of bed mobility with improved trunk elevation this date. tp completes STS in stedy with S-MIN A Overall! Pt sits EOB for trunk control/WB movements using lateral leans in B directions and weight shifting forward in sets of 5. Pt states after last round "this is hard because I feel like I have a ball of poop this is squeezing on." Pt returned to room to use stedy to transition to [facility] with total A for toielting but MIN A for sit to stand in stedy. Wife supervises toileting and completes transfer in stedy with coaching from OT for body mechanics. Wife demo good safety with stedy and able ot retunr pt to bed despite no luck for BM. Exited session with pt seated in bed exit alarm on and call light in reach   Session 2: Pt received in bed having just had another BM. Pt transported to shower with stedy with MOD A for bed  mobility and S for sit to stand in stedy to transfer to [facility] in shower. Pt able to wash L armpit with RUE with no help today, however still requires A for extension of R elbow. Total A for peri care/post BM hygeine. Pt returned to bed with brief donned total A. Exited session with pt seated in bed, exit alarm on and call light in reach   [place] Stim One 330 pulse width 35 Hz pulse rate On 8 sec/ off 8 sec Ramp up/ down 2 sec Symmetrical Biphasic wave form  Max intensity 117m at 500 Ohm load 60 min estim at deltoid with skin in tact     Therapy Documentation Precautions:  Precautions Precautions: Fall,Other (comment) Precaution Comments: Strong BLE extensor tone, hx of R hand tremor Restrictions Weight Bearing Restrictions: No General:   Vital Signs: Therapy Vitals Temp: 98.9 F (37.2 C) Temp Source: Oral Pulse Rate: 76 Resp: 16 BP: 137/77 Patient Position (if appropriate): Lying Oxygen Therapy SpO2: 98 % O2 Device: Room Air Pain:   ADL: ADL Grooming: Moderate assistance (per pt report) Where Assessed-Grooming: Bed level Upper Body Bathing: Moderate assistance Where Assessed-Upper Body Bathing: Bed level Lower Body Bathing: Dependent (+2 rolling) Where Assessed-Lower Body Bathing: Bed level Upper Body Dressing: Dependent (+2 for rolling to pull down back) Where Assessed-Upper Body Dressing: Bed level Lower Body Dressing: Dependent  Where Assessed-Lower Body Dressing: Bed level Toileting: Unable to assess Vision   Perception    Praxis   Exercises:   Other Treatments:     Therapy/Group: Individual Therapy  [name] 06/04/2020, 6:51 AM

## 2020-06-05 NOTE — Progress Notes (Signed)
PROGRESS NOTE   Subjective/Complaints: Slept well again. Arousal and day time energy seems better also. Still not able to process information like he wants.   ROS: Patient denies fever, rash, sore throat, blurred vision, nausea, vomiting, diarrhea, cough, shortness of breath or chest pain,  headache, or mood change.   Objective:   No results found. No results for input(s): WBC, HGB, HCT, PLT in the last 72 hours. No results for input(s): NA, K, CL, CO2, GLUCOSE, BUN, CREATININE, CALCIUM in the last 72 hours.  Intake/Output Summary (Last 24 hours) at 06/05/2020 1040 Last data filed at 06/05/2020 0800 Gross per 24 hour  Intake 720 ml  Output --  Net 720 ml        Physical Exam: Vital Signs Blood pressure 140/84, pulse 68, temperature 98 F (36.7 C), temperature source Oral, resp. rate 16, height 6' (1.829 m), weight 124.5 kg, SpO2 95 %.  Constitutional: No distress . Vital signs reviewed. HEENT: EOMI, oral membranes moist Neck: supple Cardiovascular: RRR without murmur. No JVD    Respiratory/Chest: CTA Bilaterally without wheezes or rales. Normal effort    GI/Abdomen: BS +, non-tender, non-distended Ext: no clubbing, cyanosis, or edema Psych: pleasant and cooperative Skin: scalp incision CDI Neurologic: apraxic but initiates better. Moving well.  Cranial nerves II through XII intact, motor strength is 5/5 in Left and 0/5 right deltoid, bicep, grip 1/5. Trace at best/5 right hip flexor, knee extensors, 1+ left hip flexor, KE--no changes. 0/5 ankle dorsiflexor and plantar flexor bilaterally. Continued extensor tone present RLE>LLE--stable. Able to remain up in [place] today. Musculoskeletal: sl bilateral shoulder discomfort with ROM      Assessment/Plan: 1. Functional deficits which require 3+ hours per day of interdisciplinary therapy in a comprehensive inpatient rehab setting.  Physiatrist is providing close team  supervision and 24 hour management of active medical problems listed below.  Physiatrist and rehab team continue to assess barriers to discharge/monitor patient progress toward functional and medical goals  Care Tool:  Bathing    Body parts bathed by patient: Left arm,Chest,Abdomen,Front perineal area,Face   Body parts bathed by helper: Right arm,Buttocks,Right upper leg,Left upper leg,Right lower leg,Left lower leg     Bathing assist Assist Level: Maximal Assistance - Patient 24 - 49%     Upper Body Dressing/Undressing Upper body dressing   What is the patient wearing?: Pull over shirt    Upper body assist Assist Level: Moderate Assistance - Patient 50 - 74%    Lower Body Dressing/Undressing Lower body dressing      What is the patient wearing?: Pants     Lower body assist Assist for lower body dressing: Maximal Assistance - Patient 25 - 49%     Toileting Toileting    Toileting assist Assist for toileting: 2 Helpers     Transfers Chair/bed transfer  Transfers assist  Chair/bed transfer activity did not occur: Safety/medical concerns  Chair/bed transfer assist level: Dependent - Patient 0% Chair/bed transfer assistive device: Other (stedy)   Locomotion Ambulation   Ambulation assist   Ambulation activity did not occur: Safety/medical concerns  Assist level: Dependent - Patient 0% Assistive device: Lite Gait Max distance: 35 ft  Walk 10 feet activity   Assist  Walk 10 feet activity did not occur: Safety/medical concerns  Assist level: Dependent - Patient 0% Assistive device: Lite Gait   Walk 50 feet activity   Assist Walk 50 feet with 2 turns activity did not occur: Safety/medical concerns         Walk 150 feet activity   Assist Walk 150 feet activity did not occur: Safety/medical concerns         Walk 10 feet on uneven surface  activity   Assist Walk 10 feet on uneven surfaces activity did not occur: Safety/medical  concerns         Wheelchair     Assist Will patient use wheelchair at discharge?: Yes Type of Wheelchair: Power    Wheelchair assist level: Supervision/Verbal cueing Max wheelchair distance: >150 ft    Wheelchair 50 feet with 2 turns activity    Assist    Wheelchair 50 feet with 2 turns activity did not occur: Safety/medical concerns   Assist Level: Supervision/Verbal cueing   Wheelchair 150 feet activity     Assist  Wheelchair 150 feet activity did not occur: Safety/medical concerns   Assist Level: Supervision/Verbal cueing   Blood pressure 140/84, pulse 68, temperature 98 F (36.7 C), temperature source Oral, resp. rate 16, height 6' (1.829 m), weight 124.5 kg, SpO2 95 %.    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   -may shower -ELOS/Goals:5/31, between equipment, help, improvement, will be able to go home  -power w/c  -Continue CIR therapies including PT, OT, and SLP     -path is positive for Grade 1 meningioma.     2. Left posterior tibial vein DVT:  Vascular ultrasound positive for age indeterminate left posterior tibial vein.   -continue xarelto. -antiplatelet therapy: N/A 3. Post-operative pain:tylenol prn  -voltaren gel for bilateral shoulder pain  -continue baclofen 10mg  qhs for cramps and to assist with sleep   4. Mood/sleep:  improved  -improved with scheduled trazodone 50mg ---continue  - rx pain, on baclofen too -antipsychotic agents: N/A  -generally improving-continue with trazodone and melatonin  -ritalin for activation and initiation has helped  -limiting antispasmodics d/t neurosedation 5. Neuropsych: This patientiscapable of making decisions on hisown behalf. 6. Skin/Wound Care:rash resolved   -5/16 incisional  drainage resolved 7. Fluids/Electrolytes/Nutrition:eating well     -Potassium level borderline---supplemented    5/13 K+ 3.6--recheck Friday 8. Hypertension. Lasix 20 mg daily   -Restarted home Flomax HS which will also help with BP   -no norvasc d/t swelling   5/2 lasix increased to 40mg  daily for elevated bp's   5/17 fair to borderline control    -continue metoprolol    -lasix at 20mg  daily    -stable Vitals:   06/04/20 1945 06/05/20 0508  BP: (!) 147/75 140/84  Pulse: 71 68  Resp: 18 16  Temp: 97.9 F (36.6 C) 98 F (36.7 C)  SpO2: 96% 95%   9. Hyperlipidemia. Lipitor 10. BPH: increased Flomax to 0.8mg  HS 11. Urinary frequency: mostly incontinent  -UA negative.  timed voiding q2H while awake with urinal- placed nursing order   -continue Flomax to 0.8mg  given continued symptoms and HTN 12. Leukocytosis. Resolved off decadron 13. Constipation:  -continue senna-s bid  -moving bowels fairly regularly now         LOS: 34 days A FACE TO FACE EVALUATION WAS PERFORMED  [name] 06/05/2020, 10:40 AM

## 2020-06-05 NOTE — Progress Notes (Signed)
Occupational Therapy Note  Patient Details  Name: Anthony Downs MRN: [id number] Date of Birth: 31-Mar-1950   Today's Date: 06/05/2020 OT Individual Time: 1660-6301 OT Individual Time Calculation (min): 60 min    OT Short Term Goals Week 4:  OT Short Term Goal 1 (Week 4): Pt will manage trunk Sup>sit wiht MOD A consistently OT Short Term Goal 1 - Progress (Week 4): Met OT Short Term Goal 2 (Week 4): Pt will tolerate trialing toileting OOB in sara + daily OT Short Term Goal 2 - Progress (Week 4): Met OT Short Term Goal 3 (Week 4): Pt will don shirt with no more than MIN A for sitting balnace on EOB OT Short Term Goal 3 - Progress (Week 4): Met OT Short Term Goal 4 (Week 4): pt will thread 1LE into pants OT Short Term Goal 4 - Progress (Week 4): Progressing toward goal  Skilled Therapeutic Interventions/Progress Updates:    Session 1: Pt received in bed with wife present. Pt, OT and wife to do practice [facility]ADL at [facility] tomorrow for family education with stedy. Pt agreeable to going to gym in stedy total A transport for time managemetn. On tx mat pt completes functional reaching with RUE in minimal ranges focusing on R attention and initiation of reach with RUE. Pt with improved iniation and ROM reaching with 1# wrist weight/repetition for improved sensory feedback. Pt completes unilateral reaching in standing with RUE on stedy bar for NMR grabbing cones with LUE wieght shifting anteriorly/laterally and placing behind pt on table in prep for clothing management in stedy. Up to MOD A for posterior/R lateral LOB with forced weight shfit forward and R to recover/balance strategies.  Saebo Stim One 330 pulse width 35 Hz pulse rate On 8 sec/ off 8 sec Ramp up/ down 2 sec Symmetrical Biphasic wave form  Max intensity 180mA at 500 Ohm load  Exited session with pt seated in bed, exit alarm on and call light in reach  Therapy Documentation Precautions:  Precautions Precautions: Fall,Other  (comment) Precaution Comments: Strong BLE extensor tone, hx of R hand tremor Restrictions Weight Bearing Restrictions: No General:   Vital Signs: Therapy Vitals Temp: 98 F (36.7 C) Temp Source: Oral Pulse Rate: 68 Resp: 16 BP: 140/84 Patient Position (if appropriate): Lying Oxygen Therapy SpO2: 95 % O2 Device: Room Air Pain:   ADL: ADL Grooming: Moderate assistance (per pt report) Where Assessed-Grooming: Bed level Upper Body Bathing: Moderate assistance Where Assessed-Upper Body Bathing: Bed level Lower Body Bathing: Dependent (+2 rolling) Where Assessed-Lower Body Bathing: Bed level Upper Body Dressing: Dependent (+2 for rolling to pull down back) Where Assessed-Upper Body Dressing: Bed level Lower Body Dressing: Dependent Where Assessed-Lower Body Dressing: Bed level Toileting: Unable to assess Vision   Perception    Praxis   Exercises:   Other Treatments:     Therapy/Group: Individual Therapy  [name] 06/05/2020, 6:48 AM

## 2020-06-05 NOTE — Progress Notes (Signed)
Physical Therapy Weekly Progress Note  Patient Details  Name: Anthony Downs MRN: [id number] Date of Birth: May 06, 1950  Beginning of progress report period: May 28, 2020 End of progress report period: Jun 05, 2020  Today's Date: 06/05/2020 PT Individual Time: 1045-1150 and 1300-1410 PT Individual Time Calculation (min): 65 min and 70 min   Patient has met 2 of 2 short term goals.  Patient with continues progress with functional mobility this week. Performing bed mobility with mod A with use of hospital bed features, transfers with mod-min A using a Stedy, power mobility >100 ft consistently with supervision, and initiated therapeutic gait training in the Lite Gait for improved activity tolerance, motor planning, and motor control with functional mobility.  Patient continues to demonstrate the following deficits muscle weakness and muscle joint tightness, decreased cardiorespiratoy endurance, impaired timing and sequencing, abnormal tone, decreased coordination and decreased motor planning, decreased initiation, decreased attention, decreased awareness, decreased problem solving, decreased memory and delayed processing and decreased sitting balance, decreased standing balance, decreased postural control, hemiplegia and decreased balance strategies and therefore will continue to benefit from skilled PT intervention to increase functional independence with mobility.  Patient progressing toward long term goals..  Continue plan of care.  PT Short Term Goals Week 4:  PT Short Term Goal 1 (Week 4): Patient will initiate dependent gait. PT Short Term Goal 1 - Progress (Week 4): Met PT Short Term Goal 2 (Week 4): Patient will perform sit to/from stand with max A of 1 person using LRAD. PT Short Term Goal 2 - Progress (Week 4): Met PT Short Term Goal 3 (Week 4): Patient will perform level transfers with mod A using LRAD. PT Short Term Goal 3 - Progress (Week 4): Discontinued (comment) (dicontinue  due to lack of progress with motor planning deficits, focus on Stedy transfers for reduced burden of care at d/c.) Week 5:  PT Short Term Goal 1 (Week 5): Patient will perform Stedy transfers with min A of 1 person >75% of the time. PT Short Term Goal 2 (Week 5): Patient will perform bed mobility with min A >75% of the time with use of hospital bed features. PT Short Term Goal 3 (Week 5): Patient will propel w/c in community setting >200 ft with supervision. PT Short Term Goal 4 (Week 5): Patient will perform dependent gait at least 3/7 days for improved activity tolerance and motor planning with functional mobility.  Skilled Therapeutic Interventions/Progress Updates:     Session 1: Patient in bed with his wife assisting with donning patient's shorts upon PT arrival. Patient alert and agreeable to PT session. Patient denied pain during session.   Patient demonstrating poor frustration tolerance and expressed frustration with his progress and burden of care on his wife. PT had patient review his chart on the computer while sitting EOB with therapist to review his progress since admission. Patient with improved affect after and stated, "well let's get going!"   Focused session on hands-on training with patient's wife for bed mobility and transfers using the [place].   Therapeutic Activity: Bed Mobility: Patient performed supine to/from sit x2 with min-mod A of 1 person using a leg lifter for his R leg and elevated HOB for reduced burden of care. Provided verbal cues for use of assistive devices and hand placement for assisting with shifting patient's hips and stabilizing his trunk. Provided demonstration for lifting patient's lower extremities onto the bed for good body mechanics and patient's wife followed demonstration well with min cues on  second trial. Patient sat EOB in midline >5 min with supervision for safety.  Transfers: Patient performed transfer bed<>power w/c using the Stedy with min-mod A  provided by his wife and PT SBA for safety. Provided verbal cues for hand placement and facilitation of forward trunk flexion to initiate standing and sitting and gluteal activation for hip extension to come to standing. Provided cues for Stedy placement around power w/c and cuing patient to sit far back to avoid safety concerns if sitting on the edge of the w/c. Educated on performing another stand in the [place] if patient not far enough back on the seat to try again to avoid sliding forward off the seat. Patient performed scooting back in the chair with feet blocked by the [facility] and hands on the armrests of the w/c x1 with mod A +2. Patient completed shifting his hips back in the chair using the tilt feature with cues for initiation and benefits of this function for this purpose.   Patient propelled the power w/c in the room with supervision for min cues to avoid hitting objects behind him to navigate to a spot he could see his wife and cousin that arrived during session.  Patient in power w/c with family in the room at end of session, patient agreeable to stay up until 1pm session to eat lunch and be ready for w/c evaluation with PT and ATP.  Power w/c off and all needs within reach.   Session 2: Patient in power w/c with his wife and cousin in the room upon PT arrival. Patient alert and agreeable to PT session. Patient denied pain during session. Jason Persons, ATP from [employer] present for 50% of session for power w/c evaluation, patient's wife participated in evaluation as well to provide details about home set-up and receive education on expectations for power w/c, transportation, and insurance coverage.   Per evaluation, PT and ATP recommending Tier 3 heavy duty mid wheel drive power wheelchair to accommodate patient's weight and functional limitations due to tetraplegia.  Recommending: -power tilt feature for positioning, pressure relief, and sitting tolerance -power recline for reduced  caregiver burden and to reduce number of transfers increased continence for voiding when in sitting -power adjustable leg rests to reduce lower extremity edema -adjustable flip back arm rests with R half lap tray to reduce gravitational forces on R upper extremity and promote use of R arm with ADLs/IADLs -B lateral thigh blocks to reduce hip abduction for improved sitting tolerance -positioning for safety, and reduced rotational forces at the hip -flip back foot plates to accommodate Stedy transfers to the w/c -padded lap belt for safety in sitting and to maintain patient's skin integrity. ATP attained patient's measurements and will provide patient with a loaner power w/c prior to d/c.  Will continue to assess patient's needs with progress and make recommendations to ATP prior to d/c.   Therapeutic Activity: Bed Mobility: Patient performed supine sit to supine with mod A, as above.  Transfers: Patient performed Stedy transfer power w/c>bed, as above  Wheelchair Mobility:  Patient propelled wheelchair >100 feet with supervision. Provided verbal cues for increased speed with propulsion for improved efficiency with travel time, demonstrating safe navigation at increased speed.   Patient was continent of bladder during session. Utilized power tilt and recline to accommodate use of urinal for voiding in sitting with total A for urinal management due to decreased upper extremity strength and coordination.   Patient in bed with his wife in the room at  end of session with breaks locked, bed alarm set, and all needs within reach. Discussed options for obtaining a Stedy for transfers at d/c with patient's wife. Provided recommendations of style and type to accommodate patient's size and maintain consistency with transfers during transition home.   Therapy Documentation Precautions:  Precautions Precautions: Fall,Other (comment) Precaution Comments: Strong BLE extensor tone, hx of R hand  tremor Restrictions Weight Bearing Restrictions: No  Therapy/Group: Individual Therapy  Menelik Mcfarren L Zadie Deemer PT, DPT  06/05/2020, 5:40 PM

## 2020-06-06 LAB — CBC
HCT: 40.8 % (ref 39.0–52.0)
Hemoglobin: 13 g/dL (ref 13.0–17.0)
MCH: 29.9 pg (ref 26.0–34.0)
MCHC: 31.9 g/dL (ref 30.0–36.0)
MCV: 93.8 fL (ref 80.0–100.0)
Platelets: 226 10*3/uL (ref 150–400)
RBC: 4.35 MIL/uL (ref 4.22–5.81)
RDW: 12.8 % (ref 11.5–15.5)
WBC: 6.8 10*3/uL (ref 4.0–10.5)
nRBC: 0 % (ref 0.0–0.2)

## 2020-06-06 LAB — BASIC METABOLIC PANEL
Anion gap: 5 (ref 5–15)
BUN: 6 mg/dL — ABNORMAL LOW (ref 8–23)
CO2: 27 mmol/L (ref 22–32)
Calcium: 9.1 mg/dL (ref 8.9–10.3)
Chloride: 103 mmol/L (ref 98–111)
Creatinine, Ser: 0.84 mg/dL (ref 0.61–1.24)
GFR, Estimated: >60 mL/min (ref >60)
Glucose, Bld: 117 mg/dL — ABNORMAL HIGH (ref 70–99)
Potassium: 3.7 mmol/L (ref 3.5–5.1)
Sodium: 135 mmol/L (ref 135–145)

## 2020-06-06 NOTE — Progress Notes (Signed)
Patient ID: Anthony Downs, male   DOB: 1950/04/07, 70 y.o.   MRN: [id number]   Diagnosis code: D49.6, D32.9, G82.50   Height: 6'0"  Weight: 274 lbs   Patient has tetraplegia which requires frequent repositioning  in ways not feasible with a normal bed. 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

## 2020-06-06 NOTE — Progress Notes (Signed)
Physical Therapy Session Note  Patient Details  Name: Anthony Downs MRN: [id number] Date of Birth: 02/21/1950  Today's Date: 06/06/2020 PT Individual Time: 1000-1045, [id number], and 1420-1445 PT Individual Time Calculation (min): 45 min, 40 min, and 25 min  Short Term Goals: Week 5:  PT Short Term Goal 1 (Week 5): Patient will perform Stedy transfers with min A of 1 person >75% of the time. PT Short Term Goal 2 (Week 5): Patient will perform bed mobility with min A >75% of the time with use of hospital bed features. PT Short Term Goal 3 (Week 5): Patient will propel w/c in community setting >200 ft with supervision. PT Short Term Goal 4 (Week 5): Patient will perform dependent gait at least 3/7 days for improved activity tolerance and motor planning with functional mobility.  Skilled Therapeutic Interventions/Progress Updates:     Session 1: Patient in bed with his wife at bedside upon PT arrival. Patient alert and agreeable to PT session. Patient denied pain during session.  Therapeutic Activity: Bed Mobility: Patient performed supine to/from sit with min A for trunk stabilization and R lower extremity management. Provided verbal cues for bending his R leg using a leg lifter then rolling with HOB flat to the L to push up to sitting and reverse technique to return to lying. Transfers: Patient performed sit to/from stand x2 using bariatric RW with mod A of 1 then max A of 1 with second person stabilizing the RW for safety, PT blocking R knee facilitate extension. Provided verbal cues and facilitation for forward trunk lean and weight shift before boosting to stand due to posterior bias with remaining extensor tone.  Neuromuscular Re-ed: Patient performed the following sitting balance activities EOB: -R upper extremity stretch into extension with weight bearing through his hand x2 min -L arm reaching to pick up a bottle off the floor with CGA for safety; performed for pre-stand  hip/trunk forward flexion to reduce posterior bias with functional transfers -R arm reaching to pick up a bottle off the floor with min A to facilitate reach and grasp with R hand, patient declined putting the bottle back on the floor due to fatigue from increased effort and time with use of R hand; performed for reasons stated above and focus or functional reach and grasp with his R hand with minimal facilitation    Therapeutic Exercise: Patient performed the following exercises with verbal and tactile cues for proper technique. -R hip/knee flexion ROM -R ankle DF stretch 2x2 min with increased tone noted today, able to progress past neutral Attempted to apply DF assist night splint to R foot with increased effort. Patient incontinent of bladder and required brief change. Patient's wife to assist patient. Night splint doffed for ease of mobility. Educated on donning night splints at least 2x/day for at least an hour and increasing patient's tolerance, as his wife reports that he has not worn them since 2 days ago.  Patient in bed with his wife preparing to change his brief at end of session with breaks locked, bed alarm set, and all needs within reach.   Session 2: Patient in bed with his wife in the room upon PT arrival. Patient alert and agreeable to PT session. Patient denied pain during session.  Therapeutic Activity: Bed Mobility: Patient performed supine to/from sit with mod-min A with use of bed rail. Provided verbal cues for rolling to the L to use his L arm to push to sit up and lower his trunk. Transfers: Patient  performed steady transfers bed<>main rehab gym with min A-CGA. Provided verbal cues for forward weight shift and hip flexion to initiation stand and sit. Patient maintained trunk stability with dynamic movement of the Stedy through the halls with supervision demonstrating improved core strength and midline orientation.  Neuromuscular Re-ed: Patient performed the following  functional motor control activities: -sit to/from stand in // bars from a standard chair with an air-ex pad in the seat for elevation x2 with min A and facilitation at hips and shoulders from 2 skilled therapists to promote forward trunk flexion to initiate sit and stand and at gluteals to promote trunk and limb extension, PT blocked R knee throughout for safety -weight shifting in standing in // bars with multi-modal external cues and tactile cues at his R quad and glutes to promote R hip/knee extension for L weight shift 2x2-3 min, on first trial, patient able to shift far enough to the L for therapist to assist R foot forward and back to step x1 with total A with a second therapist providing min A-CGA for safety/balance  Provided rest breaks between trials and demonstrated desired motor control in functional patterns for patient.   Patient very fatigue after standing activities and provided a 30 min lying rest break before PT returned to complete the last 30 min of session. Patient requested bed level exercise upon PT return due to fatigue.  Patient in bed with his wife in the room at end of session with breaks locked, bed alarm set, and all needs within reach.   Session 3: Patient in bed with his wife in the room upon PT arrival. Patient alert and agreeable to PT session, and appreciative of rest break provided. Patient denied pain during session. Patient did not recall requesting bed level exercise, but stated that was all he could tolerate at this time.  Neuromuscular Re-ed: Patient performed the following lower extremity motor control activities: -B knee hip flexion progressing from AROM to AROM with resistance on L and PROM to AAROM on R 2x10 -B hip IR/ER x10 AAROM>AROM on L and PROM with tactile cues to agonist muscles on R -B DF/PF x10 with total A on R, performed both sides together to promote muscle recruitment on R -bridging x5 with manual facilitation and accomodation at knee and foot  on R for increased hip clearance and glut/hamstring activation  Donned B DF adjustable night splints for DF stretch at end of session. Patient's wife donned L and PT donned R with total A.  Patient in bed with his wife in the room at end of session with breaks locked, bed alarm set, and all needs within reach.    Therapy Documentation Precautions:  Precautions Precautions: Fall,Other (comment) Precaution Comments: Strong BLE extensor tone, hx of R hand tremor Restrictions Weight Bearing Restrictions: No   Therapy/Group: Individual Therapy  Akina Maish L Chanti Golubski PT, DPT  06/06/2020, 10:47 AM

## 2020-06-06 NOTE — Progress Notes (Signed)
Patient ID: Anthony Downs, male   DOB: 01/21/1950, 71 y.o.   MRN: [id number]  SW received updates from PT that [facility] would be willing to get pt a hospital bed, and there would be no cost due to coverage. SW spoke with Sarah/[facility] ([phone]/f:[phone]) to discuss above. Confirms based on his coverage, supplement would cover the co-pay portion. Will need order and hospital bed statement.   SW received phone call from pt wife who was asking about ambulance transport home on date of discharge. SW confirmed that we will assist with transportation. SW informed on hospital bed being ordered through [facility] based on above.   SW faxed order to [place], MSW, [place] Office: [phone] Cell: [phone] Fax: [phone]

## 2020-06-06 NOTE — Progress Notes (Addendum)
PROGRESS NOTE   Subjective/Complaints: Slept well again. Arousal and day time energy seems better also. Still not able to process information like he wants.   ROS: Patient denies fever, rash, sore throat, blurred vision, nausea, vomiting, diarrhea, cough, shortness of breath or chest pain,  headache, or mood change.   Objective:   No results found. Recent Labs    06/06/20 0632  WBC 6.8  HGB 13.0  HCT 40.8  PLT 226   Recent Labs    06/06/20 0632  NA 135  K 3.7  CL 103  CO2 27  GLUCOSE 117*  BUN 6*  CREATININE 0.84  CALCIUM 9.1    Intake/Output Summary (Last 24 hours) at 06/06/2020 1122 Last data filed at 06/06/2020 0700 Gross per 24 hour  Intake 480 ml  Output --  Net 480 ml        Physical Exam: Vital Signs Blood pressure 129/77, pulse 72, temperature 98.4 F (36.9 C), resp. rate 18, height 6' (1.829 m), weight 124.5 kg, SpO2 97 %.  Constitutional: No distress . Vital signs reviewed. HEENT: EOMI, oral membranes moist Neck: supple Cardiovascular: RRR without murmur. No JVD    Respiratory/Chest: CTA Bilaterally without wheezes or rales. Normal effort    GI/Abdomen: BS +, non-tender, non-distended Ext: no clubbing, cyanosis, or edema Psych: pleasant and cooperative Skin: scalp incision CDI Neurologic: apraxic but initiates better. Moving well.  Cranial nerves II through XII intact, motor strength is 5/5 in Left and 0/5 right deltoid, bicep, grip 1/5. Trace at best/5 right hip flexor, knee extensors, 1+ left hip flexor, KE--no changes. 0/5 ankle dorsiflexor and plantar flexor bilaterally. Continued extensor tone present RLE>LLE--stable. Able to remain up in [place] today. Musculoskeletal: sl bilateral shoulder discomfort with ROM      Assessment/Plan: 1. Functional deficits which require 3+ hours per day of interdisciplinary therapy in a comprehensive inpatient rehab setting.  Physiatrist is providing  close team supervision and 24 hour management of active medical problems listed below.  Physiatrist and rehab team continue to assess barriers to discharge/monitor patient progress toward functional and medical goals  Care Tool:  Bathing    Body parts bathed by patient: Left arm,Chest,Abdomen,Front perineal area,Face   Body parts bathed by helper: Right arm,Buttocks,Right upper leg,Left upper leg,Right lower leg,Left lower leg     Bathing assist Assist Level: Maximal Assistance - Patient 24 - 49%     Upper Body Dressing/Undressing Upper body dressing   What is the patient wearing?: Pull over shirt    Upper body assist Assist Level: Moderate Assistance - Patient 50 - 74%    Lower Body Dressing/Undressing Lower body dressing      What is the patient wearing?: Pants     Lower body assist Assist for lower body dressing: Maximal Assistance - Patient 25 - 49%     Toileting Toileting    Toileting assist Assist for toileting: 2 Helpers     Transfers Chair/bed transfer  Transfers assist  Chair/bed transfer activity did not occur: Safety/medical concerns  Chair/bed transfer assist level: Dependent - Patient 0% Chair/bed transfer assistive device: Other (stedy)   Locomotion Ambulation   Ambulation assist   Ambulation activity did  not occur: Safety/medical concerns  Assist level: Dependent - Patient 0% Assistive device: Lite Gait Max distance: 35 ft   Walk 10 feet activity   Assist  Walk 10 feet activity did not occur: Safety/medical concerns  Assist level: Dependent - Patient 0% Assistive device: Lite Gait   Walk 50 feet activity   Assist Walk 50 feet with 2 turns activity did not occur: Safety/medical concerns         Walk 150 feet activity   Assist Walk 150 feet activity did not occur: Safety/medical concerns         Walk 10 feet on uneven surface  activity   Assist Walk 10 feet on uneven surfaces activity did not occur: Safety/medical  concerns         Wheelchair     Assist Will patient use wheelchair at discharge?: Yes Type of Wheelchair: Power    Wheelchair assist level: Supervision/Verbal cueing Max wheelchair distance: >150 ft    Wheelchair 50 feet with 2 turns activity    Assist    Wheelchair 50 feet with 2 turns activity did not occur: Safety/medical concerns   Assist Level: Supervision/Verbal cueing   Wheelchair 150 feet activity     Assist  Wheelchair 150 feet activity did not occur: Safety/medical concerns   Assist Level: Supervision/Verbal cueing   Blood pressure 129/77, pulse 72, temperature 98.4 F (36.9 C), resp. rate 18, height 6' (1.829 m), weight 124.5 kg, SpO2 97 %.    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 He is status post tumor resection 04/29/2020.   -may shower -ELOS/Goals:5/31, between equipment, help, improvement, will be able to go home  -patient was seen for   -Continue CIR therapies including PT, OT, and SLP     -path is positive for Grade 1 meningioma.    -pt demonstrating more functional use of RUE although he has substantial tremor 2. Left posterior tibial vein DVT:  Vascular ultrasound positive for age indeterminate left posterior tibial vein.   -continue xarelto. -antiplatelet therapy: N/A 3. Post-operative pain:tylenol prn  -voltaren gel for bilateral shoulder pain  -continue baclofen 10mg  qhs for cramps and to assist with sleep   4. Mood/sleep:  improved  -improved with scheduled trazodone 50mg ---continue  - rx pain, on baclofen too -antipsychotic agents: N/A  -generally improving-continue with trazodone and melatonin  -ritalin for activation and initiation has helped  -limiting antispasmodics d/t  neurosedation 5. Neuropsych: This patientiscapable of making decisions on hisown behalf. 6. Skin/Wound Care:rash resolved   -5/16 incisional drainage resolved--can dc dressing   -remove one remaining scalp staple 7. Fluids/Electrolytes/Nutrition:eating well     -Potassium level borderline---supplemented    5/20 K+ 3.7--continue same plan 8. Hypertension. Lasix 20 mg daily   -Restarted home Flomax HS which will also help with BP   -no norvasc d/t swelling   5/2 lasix increased to 40mg  daily for elevated bp's   5/20 fair to borderline control    -continue metoprolol    -lasix at 20mg  daily    -stable Vitals:   06/05/20 1923 06/06/20 0427  BP: 140/84 129/77  Pulse: 80 72  Resp: 17 18  Temp: 98.2 F (36.8 C) 98.4 F (36.9 C)  SpO2: 93% 97%   9. Hyperlipidemia. Lipitor 10. BPH: increased Flomax to 0.8mg  HS  11. Urinary frequency: mostly incontinent  -UA negative.  timed voiding q2H while awake with urinal- placed nursing order   -continue Flomax to 0.8mg  given continued symptoms and HTN 12. Leukocytosis. Resolved off decadron 13. Constipation:  -continue senna-s bid  -5/20 moving bowels fairly regularly now but hasn't had one since 5/18   -sorbitol today         LOS: 35 days A FACE TO [place] 06/06/2020, 11:22 AM

## 2020-06-06 NOTE — Progress Notes (Signed)
Occupational Therapy Weekly Progress Note  Patient Details  Name: Anthony Downs MRN: [id number] Date of Birth: 1950/07/29  Beginning of progress report period: May 30, 2020 End of progress report period: Jun 06, 2020  815-930 75 min  Patient has met 3 of 3 short term goals.  Pt has made excellent progress this reporting period improving in stedy transfers to MIN-occasional MOD A overall with transfers, MIN A UB bathing/dressing at at EOB and MAX A for LB bathing/dressing EOB. Pt wife has procured a stedy and is ready to take him home after next reporting period. Family training in stedy has been initiated with cuing still requried for body mechanics and decreasing overcuing provided from wife.   Patient continues to demonstrate the following deficits: muscle weakness, decreased cardiorespiratoy endurance, impaired timing and sequencing, abnormal tone, unbalanced muscle activation, motor apraxia, ataxia, decreased coordination, and decreased motor planning, decreased attention to right and decreased motor planning, decreased initiation, decreased attention, decreased awareness, decreased problem solving, decreased safety awareness, decreased memory, and delayed processing, and decreased sitting balance, decreased standing balance, decreased postural control, hemiplegia, and decreased balance strategies and therefore will continue to benefit from skilled OT intervention to enhance overall performance with BADL and iADL.  Patient  goals changed to reflect use of stedy .  Plan of care revisions: toilet transfer with stedy, MAX A LB dressing.  OT Short Term Goals Week 4:  OT Short Term Goal 1 (Week 4): Pt will manage trunk Sup>sit wiht MOD A consistently OT Short Term Goal 1 - Progress (Week 4): Met OT Short Term Goal 2 (Week 4): Pt will tolerate trialing toileting OOB in sara + daily OT Short Term Goal 2 - Progress (Week 4): Met OT Short Term Goal 3 (Week 4): Pt will don shirt with no more  than MIN A for sitting balnace on EOB OT Short Term Goal 3 - Progress (Week 4): Met OT Short Term Goal 4 (Week 4): pt will thread 1LE into pants OT Short Term Goal 4 - Progress (Week 4): Progressing toward goal Week 5:  OT Short Term Goal 1 (Week 5): Pt will don shirt with MIN A overall OT Short Term Goal 2 (Week 5): Pt will roll in B directions with MOD A OT Short Term Goal 3 (Week 5): Pt will groom at sink with no VC for sequencing to demo improve processing speed OT Short Term Goal 4 (Week 5): Pt will thread 1LE at EOB with A for sitting balance  Skilled Therapeutic Interventions/Progress Updates:     Pt received in bed with wife present ADL: Wife and pt complete ADL together with coaching on seuqnce to decreaseBOC/# of stands in stedy. Pt wife procvides all A to manage pt to EOB and STS with VC for body positioning to position Les on stedy. Wife cued to count to 10 to avoid overcuing d/t poor processing speed Pt completes bathing with A only to wash buttocks, back and B feet Pt completes UB dressing with MIN A to thread RUE Pt completes LB dressing with pt able ot advance pants past hips with MIN A in standing (MAX A to thread BLE) Pt completes footwear with total A   Therapeutic exercise Anterior weight shifts on ball at EOB 3x10 chest press and shoulder flex/ext in supine.   Pt left at end of session in  with exit alarm on, call light in reach and all needs met   Therapy Documentation Precautions:  Precautions Precautions: Fall,Other (comment) Precaution Comments:  Strong BLE extensor tone, hx of R hand tremor Restrictions Weight Bearing Restrictions: No General:   Vital Signs: Therapy Vitals Temp: 98.4 F (36.9 C) Pulse Rate: 72 Resp: 18 BP: 129/77 Patient Position (if appropriate): Lying Oxygen Therapy SpO2: 97 % O2 Device: Room Air Pain:   ADL: ADL Grooming: Moderate assistance (per pt report) Where Assessed-Grooming: Bed level Upper Body Bathing:  Moderate assistance Where Assessed-Upper Body Bathing: Bed level Lower Body Bathing: Dependent (+2 rolling) Where Assessed-Lower Body Bathing: Bed level Upper Body Dressing: Dependent (+2 for rolling to pull down back) Where Assessed-Upper Body Dressing: Bed level Lower Body Dressing: Dependent Where Assessed-Lower Body Dressing: Bed level Toileting: Unable to assess Vision   Perception    Praxis   Exercises:   Other Treatments:     Therapy/Group: Individual Therapy  [name] 06/06/2020, 7:01 AM

## 2020-06-07 NOTE — Plan of Care (Signed)
  Problem: RH Toilet Transfers Goal: LTG Patient will perform toilet transfers w/assist (OT) Description: LTG: Patient will perform toilet transfers with assist, with/without cues using equipment (OT) Flowsheets (Taken 06/07/2020 0656) LTG: Pt will perform toilet transfers with assistance level of: (Pt will transfer in stedy with MIN A consistently to/from [facility]) Other (Comment) Note: Changed to reflect use of stedy upon DC SMS OT   Problem: RH Tub/Shower Transfers Goal: LTG Patient will perform tub/shower transfers w/assist (OT) Description: LTG: Patient will perform tub/shower transfers with assist, with/without cues using equipment (OT) Outcome: Not Applicable Note: Discontinued d/t inaccessible shower SMS OT   Problem: RH Dressing Goal: LTG Patient will perform lower body dressing w/assist (OT) Description: LTG: Patient will perform lower body dressing with assist, with/without cues in positioning using equipment (OT) Flowsheets (Taken 06/07/2020 0656) LTG: Pt will perform lower body dressing with assistance level of: Maximal Assistance - Patient 25 - 49% Note: Downgraded d/t progress SMS OT

## 2020-06-07 NOTE — Progress Notes (Signed)
Occupational Therapy Session Note  Patient Details  Name: Anthony Downs MRN: [id number] Date of Birth: [birth year]  Today's Date: 06/07/2020 OT Individual Time: 1300-1400 OT Individual Time Calculation (min): 60 min    Short Term Goals: Week 1:  OT Short Term Goal 1 (Week 1): Pt will roll with MAX A of 1 caregiver to decrease BOC for toileitng OT Short Term Goal 1 - Progress (Week 1): Met OT Short Term Goal 2 (Week 1): Pt will tolerate laying sidelying on R 1x per day to assist with tone management OT Short Term Goal 2 - Progress (Week 1): Met OT Short Term Goal 3 (Week 1): Pt will recall hemi dressing techniques with MOD VC OT Short Term Goal 3 - Progress (Week 1): Progressing toward goal OT Short Term Goal 4 (Week 1): Pt will sit EOB 10 min with mod A in prep for ADL OT Short Term Goal 4 - Progress (Week 1): Met  Skilled Therapeutic Interventions/Progress Updates:    1:1. Pt received in bed agreeable ot OT. Pt with no pain and transfers instedy with S for sit>stand and MIN A for facilitation of hip flexion during stand>sit. Pt lies in supine on mat fr supine therex with dowel rod: shoulder flex/ext, chest press, horizontal ab/adduciton and elbow fle/ext with sup grip for BUE strengthening/NMR. Pt completes towel folding 3x with BUE for NMR during functional activity!! Exited session with pt seated in bed, exit alarm on and call light in reach   Therapy Documentation Precautions:  Precautions Precautions: Fall,Other (comment) Precaution Comments: Strong BLE extensor tone, hx of R hand tremor Restrictions Weight Bearing Restrictions: No General:   Vital Signs: Therapy Vitals Temp: 98 F (36.7 C) Pulse Rate: 80 Resp: 18 BP: (!) 145/102 Patient Position (if appropriate): Lying Oxygen Therapy SpO2: 98 % O2 Device: Room Air Pain:   ADL: ADL Grooming: Moderate assistance (per pt report) Where Assessed-Grooming: Bed level Upper Body Bathing: Moderate assistance Where  Assessed-Upper Body Bathing: Bed level Lower Body Bathing: Dependent (+2 rolling) Where Assessed-Lower Body Bathing: Bed level Upper Body Dressing: Dependent (+2 for rolling to pull down back) Where Assessed-Upper Body Dressing: Bed level Lower Body Dressing: Dependent Where Assessed-Lower Body Dressing: Bed level Toileting: Unable to assess Vision   Perception    Praxis   Exercises:   Other Treatments:     Therapy/Group: Individual Therapy  [name] 06/07/2020, 6:44 AM

## 2020-06-08 DIAGNOSIS — E876 Hypokalemia: Secondary | ICD-10-CM

## 2020-06-08 DIAGNOSIS — I10 Essential (primary) hypertension: Secondary | ICD-10-CM

## 2020-06-08 DIAGNOSIS — K5901 Slow transit constipation: Secondary | ICD-10-CM

## 2020-06-08 DIAGNOSIS — G8918 Other acute postprocedural pain: Secondary | ICD-10-CM

## 2020-06-08 DIAGNOSIS — D329 Benign neoplasm of meninges, unspecified: Secondary | ICD-10-CM

## 2020-06-08 NOTE — Progress Notes (Signed)
PROGRESS NOTE   Subjective/Complaints: Patient seen sitting up in bed this morning.  He states he did not sleep well overnight due to being in the hospital.  He ordered me to pick up his remote and then turn off his lines.  ROS: Denies CP, SOB, N/V/D  Objective:   No results found. Recent Labs    06/06/20 0632  WBC 6.8  HGB 13.0  HCT 40.8  PLT 226   Recent Labs    06/06/20 0632  NA 135  K 3.7  CL 103  CO2 27  GLUCOSE 117*  BUN 6*  CREATININE 0.84  CALCIUM 9.1    Intake/Output Summary (Last 24 hours) at 06/08/2020 0939 Last data filed at 06/08/2020 0745 Gross per 24 hour  Intake 840 ml  Output --  Net 840 ml        Physical Exam: Vital Signs Blood pressure (!) 143/63, pulse (!) 56, temperature 98.6 F (37 C), temperature source Oral, resp. rate 19, height 6' (1.829 m), weight 124.5 kg, SpO2 91 %. Constitutional: No distress . Vital signs reviewed. HENT: Normocephalic.  Scalp incision. Eyes: EOMI. No discharge. Cardiovascular: No JVD.  RRR. Respiratory: Normal effort.  No stridor.  Bilateral clear to auscultation. GI: Non-distended.  BS +. Skin: Warm and dry.   Incision with dressing CDI. Psych: Flat.  Delayed. Musc: No edema in extremities.  No tenderness in extremities. Neuro: Alert Motor: LUE: 5/5 proximal distal LLE: Hip flexion, knee extension 1/5, ankle dorsiflexion 0/5 RUE/RLE: 5/5 proximal distal Right upper extremity tremor Word finding difficulties  Assessment/Plan: 1. Functional deficits which require 3+ hours per day of interdisciplinary therapy in a comprehensive inpatient rehab setting.  Physiatrist is providing close team supervision and 24 hour management of active medical problems listed below.  Physiatrist and rehab team continue to assess barriers to discharge/monitor patient progress toward functional and medical goals  Care Tool:  Bathing    Body parts bathed by patient:  Left arm,Chest,Abdomen,Front perineal area,Face   Body parts bathed by helper: Right arm,Buttocks,Right upper leg,Left upper leg,Right lower leg,Left lower leg     Bathing assist Assist Level: Maximal Assistance - Patient 24 - 49%     Upper Body Dressing/Undressing Upper body dressing   What is the patient wearing?: Pull over shirt    Upper body assist Assist Level: Moderate Assistance - Patient 50 - 74%    Lower Body Dressing/Undressing Lower body dressing      What is the patient wearing?: Pants     Lower body assist Assist for lower body dressing: Maximal Assistance - Patient 25 - 49%     Toileting Toileting    Toileting assist Assist for toileting: 2 Helpers     Transfers Chair/bed transfer  Transfers assist  Chair/bed transfer activity did not occur: Safety/medical concerns  Chair/bed transfer assist level: Dependent - Patient 0% Chair/bed transfer assistive device: Other (stedy)   Locomotion Ambulation   Ambulation assist   Ambulation activity did not occur: Safety/medical concerns  Assist level: Dependent - Patient 0% Assistive device: Lite Gait Max distance: 35 ft   Walk 10 feet activity   Assist  Walk 10 feet activity did not occur:  Safety/medical concerns  Assist level: Dependent - Patient 0% Assistive device: Lite Gait   Walk 50 feet activity   Assist Walk 50 feet with 2 turns activity did not occur: Safety/medical concerns         Walk 150 feet activity   Assist Walk 150 feet activity did not occur: Safety/medical concerns         Walk 10 feet on uneven surface  activity   Assist Walk 10 feet on uneven surfaces activity did not occur: Safety/medical concerns         Wheelchair     Assist Will patient use wheelchair at discharge?: Yes Type of Wheelchair: Power    Wheelchair assist level: Supervision/Verbal cueing Max wheelchair distance: >150 ft    Wheelchair 50 feet with 2 turns activity    Assist     Wheelchair 50 feet with 2 turns activity did not occur: Safety/medical concerns   Assist Level: Supervision/Verbal cueing   Wheelchair 150 feet activity     Assist  Wheelchair 150 feet activity did not occur: Safety/medical concerns   Assist Level: Supervision/Verbal cueing   Blood pressure (!) 143/63, pulse (!) 56, temperature 98.6 F (37 C), temperature source Oral, resp. rate 19, height 6' (1.829 m), weight 124.5 kg, SpO2 91 %.    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 He is status post tumor resection 04/29/2020.   Continue CIR   -path is positive for Grade 1 meningioma.   2. Left posterior tibial vein DVT:  Vascular ultrasound positive for age indeterminate left posterior tibial vein.   -continue xarelto. -antiplatelet therapy: N/A 3. Post-operative pain:tylenol prn  -voltaren gel for bilateral shoulder pain  -continue baclofen 10mg  qhs for cramps and to assist with sleep  Appears to be controlled on 5/22 4. Mood/sleep:  improved  -improved with scheduled trazodone 50mg  and melatonin ---continue  - rx pain, on baclofen too -antipsychotic agents: N/A  -ritalin for activation and initiation has helped  -limiting antispasmodics d/t neurosedation 5. Neuropsych: This patientiscapable of making decisions on hisown behalf. 6. Skin/Wound Care:rash resolved 7. Fluids/Electrolytes/Nutrition:eating well     -Potassium level borderline---supplemented    Potassium 3.7 on 5/20 8. Hypertension.    -Restarted home Flomax HS which will also help with BP   -no norvasc d/t swelling   5/20 lasix increased to 40mg  daily for elevated bp's       -continue metoprolol   Slightly elevated on 5/22, continue to monitor Vitals:   06/07/20 1938  06/08/20 0524  BP: (!) 155/61 (!) 143/63  Pulse: 66 (!) 56  Resp: 18 19  Temp: 97.9 F (36.6 C) 98.6 F (37 C)  SpO2: 97% 91%   9. Hyperlipidemia. Lipitor 10. BPH: increased Flomax to 0.8mg  HS  11. Urinary frequency: mostly incontinent  -UA negative.  timed voiding q2H while awake with urinal- placed nursing order   -continue Flomax to 0.8mg  given continued symptoms and HTN 12. Leukocytosis. Resolved off decadron 13. Constipation:  -continue senna-s bid  Improving        LOS: 37 days A FACE TO FACE EVALUATION WAS PERFORMED  Anthony Downs 06/08/2020, 9:39 AM

## 2020-06-09 LAB — BASIC METABOLIC PANEL
Anion gap: 8 (ref 5–15)
BUN: 8 mg/dL (ref 8–23)
CO2: 28 mmol/L (ref 22–32)
Calcium: 9.1 mg/dL (ref 8.9–10.3)
Chloride: 103 mmol/L (ref 98–111)
Creatinine, Ser: 1.04 mg/dL (ref 0.61–1.24)
GFR, Estimated: >60 mL/min (ref >60)
Glucose, Bld: 105 mg/dL — ABNORMAL HIGH (ref 70–99)
Potassium: 3.7 mmol/L (ref 3.5–5.1)
Sodium: 139 mmol/L (ref 135–145)

## 2020-06-09 LAB — CBC
HCT: 41.5 % (ref 39.0–52.0)
Hemoglobin: 13.5 g/dL (ref 13.0–17.0)
MCH: 30.2 pg (ref 26.0–34.0)
MCHC: 32.5 g/dL (ref 30.0–36.0)
MCV: 92.8 fL (ref 80.0–100.0)
Platelets: 250 10*3/uL (ref 150–400)
RBC: 4.47 MIL/uL (ref 4.22–5.81)
RDW: 13 % (ref 11.5–15.5)
WBC: 8.6 10*3/uL (ref 4.0–10.5)
nRBC: 0 % (ref 0.0–0.2)

## 2020-06-09 LAB — URINALYSIS, COMPLETE (UACMP) WITH MICROSCOPIC
Bacteria, UA: NONE SEEN
Bilirubin Urine: NEGATIVE
Glucose, UA: NEGATIVE mg/dL
Hgb urine dipstick: NEGATIVE
Ketones, ur: NEGATIVE mg/dL
Leukocytes,Ua: NEGATIVE
Nitrite: NEGATIVE
Protein, ur: NEGATIVE mg/dL
Specific Gravity, Urine: 1.009 (ref 1.005–1.030)
pH: 7 (ref 5.0–8.0)

## 2020-06-09 NOTE — Progress Notes (Signed)
PROGRESS NOTE   Subjective/Complaints: Pt more confused this weekend, especially Sunday and again today. No cough, fever.  ROS: Limited due to cognitive/behavioral   Objective:   No results found. No results for input(s): WBC, HGB, HCT, PLT in the last 72 hours. No results for input(s): NA, K, CL, CO2, GLUCOSE, BUN, CREATININE, CALCIUM in the last 72 hours.  Intake/Output Summary (Last 24 hours) at 06/09/2020 1051 Last data filed at 06/08/2020 2251 Gross per 24 hour  Intake 600 ml  Output 175 ml  Net 425 ml        Physical Exam: Vital Signs Blood pressure (!) 151/80, pulse 70, temperature 98.4 F (36.9 C), resp. rate 17, height 6' (1.829 m), weight 124.5 kg, SpO2 97 %. Constitutional: No distress . Vital signs reviewed. HEENT: EOMI, oral membranes moist Neck: supple Cardiovascular: RRR without murmur. No JVD    Respiratory/Chest: CTA Bilaterally without wheezes or rales. Normal effort    GI/Abdomen: BS +, non-tender, non-distended Ext: no clubbing, cyanosis, or edema Psych: flat, more delayed Skin: scalp wound clean.  Musc: No edema in extremities.  No tenderness in extremities. Neuro: fairly Alert, more delayed today,  Motor: LUE: 5/5 proximal distal LLE: Hip flexion, knee extension 1/5, ankle dorsiflexion 0/5 RUE/RLE: 5/5 proximal distal Right upper extremity tremor ongoing    Assessment/Plan: 1. Functional deficits which require 3+ hours per day of interdisciplinary therapy in a comprehensive inpatient rehab setting.  Physiatrist is providing close team supervision and 24 hour management of active medical problems listed below.  Physiatrist and rehab team continue to assess barriers to discharge/monitor patient progress toward functional and medical goals  Care Tool:  Bathing    Body parts bathed by patient: Left arm,Chest,Abdomen,Front perineal area,Face   Body parts bathed by helper: Right  arm,Buttocks,Right upper leg,Left upper leg,Right lower leg,Left lower leg     Bathing assist Assist Level: Maximal Assistance - Patient 24 - 49%     Upper Body Dressing/Undressing Upper body dressing   What is the patient wearing?: Pull over shirt    Upper body assist Assist Level: Moderate Assistance - Patient 50 - 74%    Lower Body Dressing/Undressing Lower body dressing      What is the patient wearing?: Pants     Lower body assist Assist for lower body dressing: Maximal Assistance - Patient 25 - 49%     Toileting Toileting    Toileting assist Assist for toileting: 2 Helpers     Transfers Chair/bed transfer  Transfers assist  Chair/bed transfer activity did not occur: Safety/medical concerns  Chair/bed transfer assist level: Dependent - Patient 0% Chair/bed transfer assistive device: Other (stedy)   Locomotion Ambulation   Ambulation assist   Ambulation activity did not occur: Safety/medical concerns  Assist level: Dependent - Patient 0% Assistive device: Lite Gait Max distance: 35 ft   Walk 10 feet activity   Assist  Walk 10 feet activity did not occur: Safety/medical concerns  Assist level: Dependent - Patient 0% Assistive device: Lite Gait   Walk 50 feet activity   Assist Walk 50 feet with 2 turns activity did not occur: Safety/medical concerns  Walk 150 feet activity   Assist Walk 150 feet activity did not occur: Safety/medical concerns         Walk 10 feet on uneven surface  activity   Assist Walk 10 feet on uneven surfaces activity did not occur: Safety/medical concerns         Wheelchair     Assist Will patient use wheelchair at discharge?: Yes Type of Wheelchair: Power    Wheelchair assist level: Supervision/Verbal cueing Max wheelchair distance: >150 ft    Wheelchair 50 feet with 2 turns activity    Assist    Wheelchair 50 feet with 2 turns activity did not occur: Safety/medical  concerns   Assist Level: Supervision/Verbal cueing   Wheelchair 150 feet activity     Assist  Wheelchair 150 feet activity did not occur: Safety/medical concerns   Assist Level: Supervision/Verbal cueing   Blood pressure (!) 151/80, pulse 70, temperature 98.4 F (36.9 C), resp. rate 17, height 6' (1.829 m), weight 124.5 kg, SpO2 97 %.    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 He is status post tumor resection 04/29/2020.   Continue CIR PT, OT, SLP  ELOS 5/31   -path is positive for Grade 1 meningioma.   2. Left posterior tibial vein DVT:  Vascular ultrasound positive for age indeterminate left posterior tibial vein.   -continue xarelto. -antiplatelet therapy: N/A 3. Post-operative pain:tylenol prn  -voltaren gel for bilateral shoulder pain  -continue baclofen 10mg  qhs for cramps and to assist with sleep  Appears to be controlled on 5/22 4. Mood/sleep:  improved  -improved with scheduled trazodone 50mg  and melatonin ---continue  - rx pain, on baclofen too -antipsychotic agents: N/A  -ritalin for activation and initiation has helped  -limiting antispasmodics d/t neurosedation 5. Neuropsych: This patientiscapable of making decisions on hisown behalf. 6. Skin/Wound Care:rash resolved 7. Fluids/Electrolytes/Nutrition:eating well     -Potassium level borderline---supplemented    Potassium 3.7 on 5/20 8. Hypertension.    -Restarted home Flomax HS which will also help with BP   -no norvasc d/t swelling   5/20 lasix increased to 40mg  daily for elevated bp's       -continue metoprolol   Borderline 5/23 Vitals:   06/08/20 1914 06/09/20 0341  BP: (!) 162/81 (!) 151/80  Pulse: 71 70  Resp: 18 17  Temp: 97.8 F (36.6 C) 98.4 F (36.9  C)  SpO2: 95% 97%   9. Hyperlipidemia. Lipitor 10. BPH: increased Flomax to 0.8mg  HS  11. Urinary frequency: mostly incontinent  -UA negative.  timed voiding q2H while awake with urinal- placed nursing order   -continue Flomax to 0.8mg  given continued symptoms and HTN 12. Leukocytosis. Resolved off decadron 13. Constipation:  -continue senna-s bid 14. Confusion: check ua, ucx,   -cbc, bmet today        LOS: 38 days A FACE TO [place] 06/09/2020, 10:51 AM

## 2020-06-09 NOTE — Progress Notes (Signed)
Occupational Therapy Session Note  Patient Details  Name: Anthony Downs MRN: [id number] Date of Birth: [birth year]  Today's Date: 06/09/2020 OT Individual Time: 4098-1191 OT Individual Time Calculation (min): 60 min   Session 2: OT Individual Time: 1330-1430 OT Individual Time Calculation (min): 60 min    Short Term Goals: Week 5:  OT Short Term Goal 1 (Week 5): Pt will don shirt with MIN A overall OT Short Term Goal 2 (Week 5): Pt will roll in B directions with MOD A OT Short Term Goal 3 (Week 5): Pt will groom at sink with no VC for sequencing to demo improve processing speed OT Short Term Goal 4 (Week 5): Pt will thread 1LE at EOB with A for sitting balance  Skilled Therapeutic Interventions/Progress Updates:    Pt received supine with his wife reporting pt is more confused this morning and restless- MD aware and in room assessing. Pt agreeable to OT session. Completed bathing at bed level. Pt with improved spontaneous activation of the RUE during bathing- able to reach with the RUE to wash under LUE. Limited by tremor, almost clonus like at times, with deep pressure provided to settle hand. Pt was able to activate R shoulder flexion for the LUE to wash under. Pt often struggles with voluntary activation vs automatic activation during ADLs. He completed LB bathing with min A for anterior peri area and max A for posterior. Pt completed rolling with min A to roll R and mod A to roll L with max cueing for positioning/technique. Pt completed bed mobility to sidelying and then to sitting EOB with mod A. He does still require max A to position BLE and hips at EOB. He donned his shirt with supervision. With stedy pt stood with min A. He was transferred to power w/c via stedy. Chair was tilted backward to allow pt to scoot his hips back. PROM provided to pt's RUE especially for elbow extension d/t increasing tone. Pt was left sitting up with all needs met, wife present.    Session 2:  Pt  supine with no c/o pain, agreeable to OT session. Pt's wife reporting he is much more alert and oriented this afternoon compared to this morning. Pt completed bed mobility rolling R with min A and transferring to EOB with mod A. Pt completed sit > stand in the stedy with CGA. He was taken via stedy to the therapy gym. Pt stood again with CGA and sat EOM unsupported. He completed one sit > stand from EOM with the RW, with max +2 assist. Increased inattention affecting performance in today's session in busy gym setting, pt requiring frequent cueing for attention to task and RUE. Pt completed closed chain scapular retraction/protraction focused activity with soft ball- requiring mod facilitation at the R elbow for extension. Pt sat perched in the stedy and completed functional reaching activity with self guided R UE AAROM. He was transferred to the power w/c and he navigated back to his room with mod cueing for arousal. Pt was left sitting up in the power w/c tilted back , all needs within reach. Wife present.   Saebo Stim One applied to pt's R tricep to encourage voluntary activation. 45 min unattended, no c/o pain or skin breakdown.  330 pulse width 35 Hz pulse rate On 8 sec/ off 8 sec Ramp up/ down 2 sec Symmetrical Biphasic wave form  Max intensity 160m at 500 Ohm load  Therapy Documentation Precautions:  Precautions Precautions: Fall,Other (comment) Precaution Comments: Strong BLE  extensor tone, hx of R hand tremor Restrictions Weight Bearing Restrictions: No   Therapy/Group: Individual Therapy  Curtis Sites 06/09/2020, 6:26 AM

## 2020-06-09 NOTE — Progress Notes (Signed)
Physical Therapy Session Note  Patient Details  Name: Anthony Downs MRN: [id number] Date of Birth: 1950/09/24  Today's Date: 06/09/2020 PT Individual Time: 3664-4034 PT Individual Time Calculation (min): 70 min   Short Term Goals: Week 5:  PT Short Term Goal 1 (Week 5): Patient will perform Stedy transfers with min A of 1 person >75% of the time. PT Short Term Goal 2 (Week 5): Patient will perform bed mobility with min A >75% of the time with use of hospital bed features. PT Short Term Goal 3 (Week 5): Patient will propel w/c in community setting >200 ft with supervision. PT Short Term Goal 4 (Week 5): Patient will perform dependent gait at least 3/7 days for improved activity tolerance and motor planning with functional mobility.  Skilled Therapeutic Interventions/Progress Updates:     Patient in power w/c with his wife in the room upon PT arrival. Patient alert and agreeable to PT session. Patient reported moderate L biceps/deltoid soreness during session, RN made aware and provided Tylenol during session. PT provided repositioning, rest breaks, and distraction as pain interventions throughout session.   Patient reported having a "bad day," patient's wife stated that he had increased confusion, unable to state his last name and unaware of where he was this morning. Also decreased verbal communication and moderate agitation, tearing a blanket in frustration. Reports RN and MD aware. Patient oriented x4, although provided the month as March rather than May. No signs of change in functional status during session, except increased fatigue and frequently falling asleep.   Focused session on power w/c mobility and fitting loaner w/c with Corene Cornea, ATP from stalls.   Therapeutic Activity: Bed Mobility: Patient performed sit to supine with mod A, provided by his wife. Patient's wife provided verbal cues for patient to use his L elbow to bring his trunk down as she assisted with his lower  extremities. Transfers: Patient performed Stedy transfers power w/c>loaner power w/c and loaner chair to bed with min A-CGA for sit-to-stand. Provided verbal cues for forward weight shift to stand and hip flexion to sit.  Wheelchair Mobility:  Patient requested to void during session. Coached patient through using power tilt and recline to improved hip angle for use of urinal. Patient's wife placed urinal for total A. Patient required significant time, but was continent of bladder and patient and his wife reported power chair features aided easier voiding in sitting, improving incontinence. Patient with difficulty propelling power w/c initially due to decreased arousal, patient fell asleep x3 attempting to propel in the hallways. Played patient's favorite music while patient initiated weaving through cones in power w/c. Required significant time for weaving around 1 cone when APT arrived and task terminated. ATP demonstrated loaner w/c features. Demonstrated use of lateral thigh bocks for management of hip abduction and adjusted head rest to patient for improved head support with power tilt. Patient propelled the loaner power wheelchair >100 feet with supervision. Provided verbal cues for increased hand movement to maintain speed during propulsion.   Patient in bed with his wife in the room, requesting PT to leave to have a BM, at end of session with breaks locked and all needs within reach. Patient's wife reported she would call for assist if necessary for toileting bed level.    Therapy Documentation Precautions:  Precautions Precautions: Fall,Other (comment) Precaution Comments: Strong BLE extensor tone, hx of R hand tremor Restrictions Weight Bearing Restrictions: No   Therapy/Group: Individual Therapy  Sadako Cegielski L Dock Baccam PT, DPT  06/09/2020, 12:44  PM  

## 2020-06-10 ENCOUNTER — Inpatient Hospital Stay (HOSPITAL_COMMUNITY): Payer: Medicare Other

## 2020-06-10 MED ORDER — METHYLPHENIDATE HCL 5 MG PO TABS
5.0000 mg | ORAL_TABLET | Freq: Two times a day (BID) | ORAL | Status: DC
  Administered 2020-06-10 – 2020-06-17 (×14): 5 mg via ORAL
  Filled 2020-06-10 (×14): qty 1

## 2020-06-10 NOTE — Progress Notes (Signed)
Occupational Therapy Session Note  Patient Details  Name: Anthony Downs MRN: [id number] Date of Birth: 03/28/1950  Today's Date: 06/10/2020 OT Individual Time: 9024-0973 OT Individual Time Calculation (min): 53 min    Short Term Goals: Week 1:  OT Short Term Goal 1 (Week 1): Pt will roll with MAX A of 1 caregiver to decrease BOC for toileitng OT Short Term Goal 1 - Progress (Week 1): Met OT Short Term Goal 2 (Week 1): Pt will tolerate laying sidelying on R 1x per day to assist with tone management OT Short Term Goal 2 - Progress (Week 1): Met OT Short Term Goal 3 (Week 1): Pt will recall hemi dressing techniques with MOD VC OT Short Term Goal 3 - Progress (Week 1): Progressing toward goal OT Short Term Goal 4 (Week 1): Pt will sit EOB 10 min with mod A in prep for ADL OT Short Term Goal 4 - Progress (Week 1): Met Week 2:  OT Short Term Goal 1 (Week 2): Pt will maintain dynamic sitting balnace in min ranges outside BOS with MIN A overall for 5 min OT Short Term Goal 1 - Progress (Week 2): Met OT Short Term Goal 2 (Week 2): Pt will recall hemi dressing techniques wiht MOD VC OT Short Term Goal 2 - Progress (Week 2): Met OT Short Term Goal 3 (Week 2): Pt will complete 2 steps of UB dressing OT Short Term Goal 3 - Progress (Week 2): Progressing toward goal OT Short Term Goal 4 (Week 2): Pt will groom with MIN A OT Short Term Goal 4 - Progress (Week 2): Met Week 3:  OT Short Term Goal 1 (Week 3): Pt will complete 2/4 steps of UB dressing OT Short Term Goal 1 - Progress (Week 3): Met OT Short Term Goal 2 (Week 3): pt will mange trunk from supine>sit with MOD A consistently OT Short Term Goal 2 - Progress (Week 3): Progressing toward goal OT Short Term Goal 3 (Week 3): Pt will use RUE as stabilizer with MIN A consistently OT Short Term Goal 3 - Progress (Week 3): Met Week 4:  OT Short Term Goal 1 (Week 4): Pt will manage trunk Sup>sit wiht MOD A consistently OT Short Term Goal 1 -  Progress (Week 4): Met OT Short Term Goal 2 (Week 4): Pt will tolerate trialing toileting OOB in sara + daily OT Short Term Goal 2 - Progress (Week 4): Met OT Short Term Goal 3 (Week 4): Pt will don shirt with no more than MIN A for sitting balnace on EOB OT Short Term Goal 3 - Progress (Week 4): Met OT Short Term Goal 4 (Week 4): pt will thread 1LE into pants OT Short Term Goal 4 - Progress (Week 4): Progressing toward goal  Skilled Therapeutic Interventions/Progress Updates:    Pt received in room with family present and consented to OT tx. Pt seen for standing tolerance, FMC, attention to task, and BUE strengthening. Pt instructed in table top activity in room while standing in Stedy to increase standing tolerance and attention to task at hand. Pt req mod A to stand in Steady, therapist assisted with placement of RUE on Stedy. Pt required frequent seated rest breaks due to fatigue, but was able to complete task with increased time and tactile cuing to remain awake. Table top activity included large pegboard for [facility] and matching activity using reference card. Discussed home environment and level of assist with family, spouse had no questions for OT. Instructed in  hand squeezing activity with upgraded hand gripper in R hand for increased strength. Pt helped back to bed with steady with mod x1, required max A to bring BLEs into bed and properly position. After tx, pt left with bed alarm on, family at bedside with all needs met.   Therapy Documentation Precautions:  Precautions Precautions: Fall,Other (comment) Precaution Comments: Strong BLE extensor tone, hx of R hand tremor Restrictions Weight Bearing Restrictions: No   Pain: none     Therapy/Group: Individual Therapy  Anthony Downs 06/10/2020, 10:37 AM

## 2020-06-10 NOTE — Progress Notes (Signed)
Physical Therapy Session Note  Patient Details  Name: Anthony Downs MRN: [id number] Date of Birth: 1950-03-21  Today's Date: 06/10/2020 PT Individual Time: 4650-3546 PT Individual Time Calculation (min): 61 min   Short Term Goals: Week 5:  PT Short Term Goal 1 (Week 5): Patient will perform Stedy transfers with min A of 1 person >75% of the time. PT Short Term Goal 2 (Week 5): Patient will perform bed mobility with min A >75% of the time with use of hospital bed features. PT Short Term Goal 3 (Week 5): Patient will propel w/c in community setting >200 ft with supervision. PT Short Term Goal 4 (Week 5): Patient will perform dependent gait at least 3/7 days for improved activity tolerance and motor planning with functional mobility.  Skilled Therapeutic Interventions/Progress Updates:    Pt received supine in bed with his wife, [name], present and pt agreeable to therapy session. Pt's wife participated in hands-on assistance throughout session and demonstrates great knowledge of how to assist patient with bed mobility and stedy transfers with min cuing from thearpist. Supine in bed donned shorts with max/total assist for threading onto LEs (pt able to lift L LE to assist with this task) and then pulled over hips using L UE to assist via R/L rolling in bed - requires min assist to roll R and mod assist to roll L due to limited R LE muscle activation to move into hooklying. Supine>sitting L EOB, HOB slightly elevated and using bedrail, with min/mod assist for trunk upright and R LE management respectively. Sit>stand EOB>stedy with CGA for safety - min facilitation for R UE placement on/off stedy bar. Stedy transfer to [facility]. Performed PWC mobility ~1,028ft down to ground level and outside - navigating in/out of elevator 2x with cuing for safety to avoid obstacles (would pull in elevator forward and then turn 180degrees around to drive out forward) - navigated up/down a small curb "cut out" outside.  Discussed participating in [facility] navigation up/down a ramp in preparation for home entry. Pt able to navigate outside and start back towards room prior to becoming lethargic but then at that point required frequent verbal/tactile stimulus to remain alert and attentive to [facility] mobility (also noted more difficulty attending to task in busy environment). Stedy transfer PWC>EOB with wife performing >90% of the transfer set-up and assistance with only min cuing from therapist. Sit>supine with pt's wife assisting with B LE management and therapist providing close CGA for trunk control and cuing on sequencing to ensure pt lying trunk down while she is assisting with lifting LEs. Pt left supine in bed in the care of his wife.  Therapy Documentation Precautions:  Precautions Precautions: Fall,Other (comment) Precaution Comments: Strong BLE extensor tone, hx of R hand tremor Restrictions Weight Bearing Restrictions: No  Pain: No reports of pain throughout session.   Therapy/Group: Individual Therapy  Tawana Scale , PT, DPT, CSRS  06/10/2020, 12:33 PM

## 2020-06-10 NOTE — Progress Notes (Signed)
Occupational Therapy Session Note  Patient Details  Name: Anthony Downs MRN: [id number] Date of Birth: [birth year]  Today's Date: 06/10/2020 OT Individual Time: 6759-1638 OT Individual Time Calculation (min): 56 min    Short Term Goals: Week 5:  OT Short Term Goal 1 (Week 5): Pt will don shirt with MIN A overall OT Short Term Goal 2 (Week 5): Pt will roll in B directions with MOD A OT Short Term Goal 3 (Week 5): Pt will groom at sink with no VC for sequencing to demo improve processing speed OT Short Term Goal 4 (Week 5): Pt will thread 1LE at EOB with A for sitting balance  Skilled Therapeutic Interventions/Progress Updates:    Pt supine with no c/o pain but reporting he did not sleep last night, unknown reason. Pt slow to initiate and respond. Pt agreeable to morning ADLs. Pt completed anterior peri hygiene with set up assist with HOB raised. Pt completed rolling R with min A for assist with posterior peri hygiene while sidelying. He was able to complete half hip bridge with manual facilitation to plant LLE for support for OT and wife to pull up pants. Pt rolled L with mod A. He required management of the BLE to come EOB, with pt able to use his LUE to push up from elbow. Pt completed sidelying to sit with mod A overall- excellent progress in trunk control. Pt sat EOB unsupported for several minutes while OT donned socks and ted hose dependently. He then completed UB bathing, with 1.5 lb weight trialed on his R wrist to decrease R tremor- slightly successful but difficult to discern- will continue to trial. Pt required min A to don shirt. Stedy used to complete sit > stand from EOB with CGA. Pt was transferred to the power w/c via stedy. He required max A to scoot his hips backward in chair and required tilt feature of w/c. He was left sitting up with all needs met, wife present.   Saebo Stim One applied to pt's R tricep to increase voluntary muscle activation for 60 min unattended. No c/o  pain before or after application and no skin issues.  330 pulse width 35 Hz pulse rate On 8 sec/ off 8 sec Ramp up/ down 2 sec Symmetrical Biphasic wave form  Max intensity 167mA at 500 Ohm load  Therapy Documentation Precautions:  Precautions Precautions: Fall,Other (comment) Precaution Comments: Strong BLE extensor tone, hx of R hand tremor Restrictions Weight Bearing Restrictions: No  Therapy/Group: Individual Therapy  Curtis Sites 06/10/2020, 6:20 AM

## 2020-06-10 NOTE — Progress Notes (Signed)
PROGRESS NOTE   Subjective/Complaints: Wife says he is still fidgety, right arm with more tremor? Not sleeping as well at night, sleep during the day? Therapy reports functional improvements however  ROS: Limited due to cognitive/behavioral    Objective:   No results found. Recent Labs    06/09/20 1155  WBC 8.6  HGB 13.5  HCT 41.5  PLT 250   Recent Labs    06/09/20 1155  NA 139  K 3.7  CL 103  CO2 28  GLUCOSE 105*  BUN 8  CREATININE 1.04  CALCIUM 9.1    Intake/Output Summary (Last 24 hours) at 06/10/2020 1251 Last data filed at 06/10/2020 1246 Gross per 24 hour  Intake 590 ml  Output 150 ml  Net 440 ml        Physical Exam: Vital Signs Blood pressure (!) 147/94, pulse 80, temperature 98.1 F (36.7 C), temperature source Oral, resp. rate 20, height 6' (1.829 m), weight 124.5 kg, SpO2 97 %. Constitutional: No distress . Vital signs reviewed. HEENT: EOMI, oral membranes moist Neck: supple Cardiovascular: RRR without murmur. No JVD    Respiratory/Chest: CTA Bilaterally without wheezes or rales. Normal effort    GI/Abdomen: BS +, non-tender, non-distended Ext: no clubbing, cyanosis, or edema Psych: flat, kept eyes closed today Skin: scalp wound clean and now closed essentially. .  Musc: No edema in extremities.  No tenderness in extremities. Neuro: fairly Alert, more delayed today,  Motor: LUE: 4/5 proximal distal LLE: Hip flexion, knee extension 1-2/5, ankle dorsiflexion tr-1/5 RUE : 3-4/5  Right upper extremity tremor ongoing, may be higher in amplitude now    Assessment/Plan: 1. Functional deficits which require 3+ hours per day of interdisciplinary therapy in a comprehensive inpatient rehab setting.  Physiatrist is providing close team supervision and 24 hour management of active medical problems listed below.  Physiatrist and rehab team continue to assess barriers to discharge/monitor patient  progress toward functional and medical goals  Care Tool:  Bathing    Body parts bathed by patient: Left arm,Chest,Abdomen,Front perineal area,Face   Body parts bathed by helper: Right arm,Buttocks,Right upper leg,Left upper leg,Right lower leg,Left lower leg     Bathing assist Assist Level: Maximal Assistance - Patient 24 - 49%     Upper Body Dressing/Undressing Upper body dressing   What is the patient wearing?: Pull over shirt    Upper body assist Assist Level: Moderate Assistance - Patient 50 - 74%    Lower Body Dressing/Undressing Lower body dressing      What is the patient wearing?: Pants     Lower body assist Assist for lower body dressing: Maximal Assistance - Patient 25 - 49%     Toileting Toileting    Toileting assist Assist for toileting: 2 Helpers     Transfers Chair/bed transfer  Transfers assist  Chair/bed transfer activity did not occur: Safety/medical concerns  Chair/bed transfer assist level: Dependent - Patient 0% Chair/bed transfer assistive device: Other (stedy)   Locomotion Ambulation   Ambulation assist   Ambulation activity did not occur: Safety/medical concerns  Assist level: Dependent - Patient 0% Assistive device: Lite Gait Max distance: 35 ft   Walk 10 feet  activity   Assist  Walk 10 feet activity did not occur: Safety/medical concerns  Assist level: Dependent - Patient 0% Assistive device: Lite Gait   Walk 50 feet activity   Assist Walk 50 feet with 2 turns activity did not occur: Safety/medical concerns         Walk 150 feet activity   Assist Walk 150 feet activity did not occur: Safety/medical concerns         Walk 10 feet on uneven surface  activity   Assist Walk 10 feet on uneven surfaces activity did not occur: Safety/medical concerns         Wheelchair     Assist Will patient use wheelchair at discharge?: Yes Type of Wheelchair: Power    Wheelchair assist level: Supervision/Verbal  cueing Max wheelchair distance: 40'    Wheelchair 50 feet with 2 turns activity    Assist    Wheelchair 50 feet with 2 turns activity did not occur: Safety/medical concerns   Assist Level: Supervision/Verbal cueing   Wheelchair 150 feet activity     Assist  Wheelchair 150 feet activity did not occur: Safety/medical concerns   Assist Level: Supervision/Verbal cueing   Blood pressure (!) 147/94, pulse 80, temperature 98.1 F (36.7 C), temperature source Oral, resp. rate 20, height 6' (1.829 m), weight 124.5 kg, SpO2 97 %.    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 He is status post tumor resection 04/29/2020.   -Continue CIR therapies including PT, OT, and SLP   ELOS 5/31   -path is positive for Grade 1 meningioma.   2. Left posterior tibial vein DVT:  Vascular ultrasound positive for age indeterminate left posterior tibial vein.   -continue xarelto. -antiplatelet therapy: N/A 3. Post-operative pain:tylenol prn  -voltaren gel for bilateral shoulder pain  -continue baclofen 10mg  qhs for cramps and to assist with sleep  Appears to be controlled on 5/24 4. Mood/sleep:  improved  -improved with scheduled trazodone 50mg  and melatonin ---continue  - rx pain, on baclofen too -antipsychotic agents: N/A  -ritalin for activation and initiation--wife worried it's making him more ?nervous, restless. Will decrease to 5mg  today 5/24    -limiting antispasmodics d/t neurosedation 5. Neuropsych: This patientiscapable of making decisions on hisown behalf. 6. Skin/Wound Care:rash resolved 7. Fluids/Electrolytes/Nutrition:eating well     -Potassium level borderline---supplemented    Potassium 3.7 on 5/24 8. Hypertension.    -Restarted home  Flomax HS which will also help with BP   -no norvasc d/t swelling   5/20 lasix increased to 40mg  daily for elevated bp's       -continue metoprolol   Borderline 5/24 Vitals:   06/09/20 2015 06/10/20 0337  BP: (!) 152/81 (!) 147/94  Pulse: 74 80  Resp: 20 20  Temp: 98.4 F (36.9 C) 98.1 F (36.7 C)  SpO2: 97% 97%   9. Hyperlipidemia. Lipitor 10. BPH: increased Flomax to 0.8mg  HS  11. Urinary frequency: mostly incontinent  -UA negative.  timed voiding q2H while awake with urinal- placed nursing order   -continue Flomax to 0.8mg  given continued symptoms and HTN 12. Leukocytosis. Resolved off decadron 13. Constipation:  -continue senna-s bid 14. Confusion, restlessness : ua negative, ucx pending   -cbc, bmet wn   -check HCT today        LOS: 39 days A FACE TO FACE EVALUATION  WAS PERFORMED  Anthony Downs 06/10/2020, 12:51 PM

## 2020-06-10 NOTE — Progress Notes (Signed)
Physical Therapy Session Note  Patient Details  Name: Anthony Downs MRN: [id number] Date of Birth: 11/16/1950  Today's Date: 06/10/2020 PT Individual Time: 0900-1000 PT Individual Time Calculation (min): 60 min   Short Term Goals: Week 5:  PT Short Term Goal 1 (Week 5): Patient will perform Stedy transfers with min A of 1 person >75% of the time. PT Short Term Goal 2 (Week 5): Patient will perform bed mobility with min A >75% of the time with use of hospital bed features. PT Short Term Goal 3 (Week 5): Patient will propel w/c in community setting >200 ft with supervision. PT Short Term Goal 4 (Week 5): Patient will perform dependent gait at least 3/7 days for improved activity tolerance and motor planning with functional mobility.  Skilled Therapeutic Interventions/Progress Updates: Pt presents sitting in power w/c, reluctantly agreeable to therapy.  Encouraged by PT and spouse that participation is necessary for safe D/C to home.  Pt performed all steps to getting w/c ready to negotiate out of room.  Pt turns on power, positions seat/back in neutral.  Pt negotiates w/c in slow speed out of room, and down hallways.  Pt falling asleep after max distance of 40'  Pt arouses w/ tactile stimuli or wakes self on own.  PT attempting to continually ask questions to maintain alertness and improved slightly.  Pt negotiated around cones w/ same level of alertness.  Pt returned to room and able to back into spot along bed w/ verbal cueing.  Pt performed sit to stand w/ mod A to Stedy, w/ manual placement of L hand on bar.  Pt returned self to tilted back in [facility] w/ all power turned off.  Spouse present in room and handed off to OT.     Therapy Documentation Precautions:  Precautions Precautions: Fall,Other (comment) Precaution Comments: Strong BLE extensor tone, hx of R hand tremor Restrictions Weight Bearing Restrictions: No General:   Vital Signs:  Pain: no c/o      Therapy/Group:  Individual Therapy  Ladoris Gene 06/10/2020, 10:02 AM

## 2020-06-10 NOTE — Progress Notes (Signed)
Patient ID: BARTHOLOMEW RAMESH, male   DOB: April 15, 1950, 70 y.o.   MRN: [id number]  SW called pt wife [name] to follow-up with updates from team conference, and review discharge plan. Reports that hospital bed and power w/c will be delivered tomorrow by [facility]. SW informed on ambulance transport to be scheduled tomorrow for 9am pick up with PTAR to home on 5/31. Wife inquired about transportation services to/from appointments. When discussing understanding of church w/c [name] to be used. She stated the [name] is broke. SW informed will explore and will leave resources for her tomorrow.   Loralee Pacas, MSW, [place] Office: [phone] Cell: [phone] Fax: [phone]

## 2020-06-10 NOTE — Patient Care Conference (Signed)
Inpatient RehabilitationTeam Conference and Plan of Care Update Date: 06/10/2020   Time: 10:21 AM    Patient Name: Anthony Downs      Medical Record Number: [id number]  Date of Birth: 1950/05/19 Sex: Male         Room/Bed: 4W18C/4W18C-01 Payor Info: Payor: MEDICARE / Plan: MEDICARE PART A AND B / Product Type: *No Product type* /    Admit Date/Time:  05/02/2020  5:12 PM  Primary Diagnosis:  Brain tumor [facility])  Hospital Problems: Principal Problem:   Brain tumor [facility]) Active Problems:   Meningioma ([place])   Slow transit constipation   Essential hypertension   Hypokalemia   Postoperative pain    Expected Discharge Date: Expected Discharge Date: 06/17/20  Team Members Present: Physician leading conference: Dr. Alger Simons Care Coodinator Present: Loralee Pacas, LCSWA;Addalyne Vandehei Creig Hines, RN, BSN, [place] Nurse Present: Dorthula Nettles, RN PT Present: Tereasa Coop, PT OT Present: Laverle Hobby, OT PPS Coordinator present : Gunnar Fusi, SLP     Current Status/Progress Goal Weekly Team Focus  Bowel/Bladder   Incont. B/B. Last BM_5/21  Pt gain cont. of B/B  assess q shift and toilet pt q 2 hours.   Swallow/Nutrition/ Hydration             ADL's   CGA-min A UB ADLs, max A LB ADLs, big improvement in RUE functional use, still limited by tone and poor initiation at times  [facility] for transfers, min-max A for ADLs  pt/family education, d/c planning, self-care retraining, RUE NMR, transfers, bed mobility   Mobility   Mod A bed mobility, mod-min A sit to stand in steady or with RW, ambulating 35 ft in Lite Gait  Mod-min A bed mobility and Stedy transfers, supervision power mobility  Activity tolerance, standing tolerance/balance, Stedy transfers, NMR of extremities, therapuetic gait training, midline orientation, patient/caregiver education.   Communication             Safety/Cognition/ Behavioral Observations            Pain   Pt states no pain  Pt remains pain free  assess  for pain q shift and PRN   Skin   Incision on head  Insicision remains clean and intact free of s/s of infection  assess skin q shift and PRN     Discharge Planning:      Team Discussion: Less aware, put in for a CT of the head, discontinue bandage of the head. Intermittent continence B/B, bilateral shoulder pain, relieved with Tylenol.  Patient on target to meet rehab goals: yes, making good progress. Contact guard to min assist upper body ADL's, max assist lower body ADL's. Using a stedy for transfers. Mod assist for bed mobility, mod/min assist for stedy transfers.  *See Care Plan and progress notes for long and short-term goals.   Revisions to Treatment Plan:  Decreased Ritalin  Teaching Needs: Family education, medication management, pain management, skin/wound care, transfer training, stedy training, balance training, endurance training, safety awareness.  Current Barriers to Discharge: Decreased caregiver support, Medical stability, Home enviroment access/layout, Incontinence, Wound care, Lack of/limited family support, Weight, Medication compliance, Behavior and Nutritional means  Possible Resolutions to Barriers: Continue current medications, provide emotional support.     Medical Summary Current Status: ?more lethargy, restless at times. has made some functional gains.  Barriers to Discharge: Medical stability   Possible Resolutions to Barriers/Weekly Focus: check head CT today, ritalin dose decrease, lab review, treat urine cx if positive   Continued Need for  Acute Rehabilitation Level of Care: The patient requires daily medical management by a physician with specialized training in physical medicine and rehabilitation for the following reasons: Direction of a multidisciplinary physical rehabilitation program to maximize functional independence : Yes Medical management of patient stability for increased activity during participation in an intensive rehabilitation  regime.: Yes Analysis of laboratory values and/or radiology reports with any subsequent need for medication adjustment and/or medical intervention. : Yes   I attest that I was present, lead the team conference, and concur with the assessment and plan of the team.   Cristi Loron 06/10/2020, 2:36 PM

## 2020-06-11 LAB — URINE CULTURE: Culture: <10000 — AB

## 2020-06-11 NOTE — Progress Notes (Signed)
PROGRESS NOTE   Subjective/Complaints: Had a fair night. Still groggy this morning when I came to room  ROS: Limited due to cognitive/behavioral   Objective:   CT HEAD WO CONTRAST  Result Date: 06/10/2020 CLINICAL DATA:  Worsening mental status changes. 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 COMPARISON:  MRI 05/01/2020.  CT 04/29/2020. FINDINGS: Brain: No abnormality affects the brainstem or cerebellum. Old left frontal encephalomalacia and lesser encephalomalacia at the left temporal tip, likely relating to old head trauma. Previous vertex craniotomy and cranioplasty related to resection of meningioma. Encephalomalacia at the frontoparietal vertices, more on the left than the right. Resolution of previously seen postoperative edema. No subdural collection. No visible residual or recurrent tumor. No sign of acute ischemic infarction. No hydrocephalus. Vascular: No acute vascular finding. Previous resection of the superior sagittal sinus. Skull: Vertex craniotomy and cranioplasty as noted above. No complicating feature. Sinuses/Orbits: Clear/normal Other: None IMPRESSION: No acute or worrisome finding. Previous vertex craniotomy and cranioplasty for resection of meningioma. No evidence of residual or recurrent tumor. No complicating feature at the region of surgery. Vertex encephalomalacia, left more than right. Chronic left inferior frontal encephalomalacia and mild left temporal tip encephalomalacia probably relating to old head trauma. Electronically Signed   By: [name] M.D.   On: 06/10/2020 17:12   Recent Labs    06/09/20 1155  WBC 8.6  HGB 13.5  HCT 41.5  PLT 250   Recent Labs    06/09/20 1155  NA 139  K 3.7  CL 103  CO2 28  GLUCOSE 105*  BUN 8   CREATININE 1.04  CALCIUM 9.1    Intake/Output Summary (Last 24 hours) at 06/11/2020 0837 Last data filed at 06/11/2020 0730 Gross per 24 hour  Intake 714 ml  Output --  Net 714 ml        Physical Exam: Vital Signs Blood pressure (!) 135/54, pulse 88, temperature 97.9 F (36.6 C), temperature source Oral, resp. rate 20, height 6' (1.829 m), weight 124.5 kg, SpO2 96 %. Constitutional: No distress . Vital signs reviewed. HEENT: EOMI, oral membranes moist Neck: supple Cardiovascular: RRR without murmur. No JVD    Respiratory/Chest: CTA Bilaterally without wheezes or rales. Normal effort    GI/Abdomen: BS +, non-tender, non-distended Ext: no clubbing, cyanosis, or edema Psych: flat, Skin: scalp wound clean and closed .  Musc: No edema in extremities.  No tenderness in extremities. Neuro: fairly Alert, more delayed today,  Motor: LUE: 4/5 proximal distal LLE: Hip flexion, knee extension 1-2/5, ankle dorsiflexion tr-1/5 RUE : 3-4/5  Right upper extremity tremor ongoing--stable    Assessment/Plan: 1. Functional deficits which require 3+ hours per day of interdisciplinary therapy in a comprehensive inpatient rehab setting.  Physiatrist is providing close team supervision and 24 hour management of active medical problems listed below.  Physiatrist and rehab team continue to assess barriers to discharge/monitor patient progress toward functional and medical goals  Care Tool:  Bathing    Body parts bathed by patient: Left arm,Chest,Abdomen,Front perineal area,Face  Body parts bathed by helper: Right arm,Buttocks,Right upper leg,Left upper leg,Right lower leg,Left lower leg     Bathing assist Assist Level: Maximal Assistance - Patient 24 - 49%     Upper Body Dressing/Undressing Upper body dressing   What is the patient wearing?: Pull over shirt    Upper body assist Assist Level: Moderate Assistance - Patient 50 - 74%    Lower Body Dressing/Undressing Lower body  dressing      What is the patient wearing?: Pants     Lower body assist Assist for lower body dressing: Maximal Assistance - Patient 25 - 49%     Toileting Toileting    Toileting assist Assist for toileting: 2 Helpers     Transfers Chair/bed transfer  Transfers assist  Chair/bed transfer activity did not occur: Safety/medical concerns  Chair/bed transfer assist level: Dependent - mechanical lift Chair/bed transfer assistive device: Other (stedy)   Locomotion Ambulation   Ambulation assist   Ambulation activity did not occur: Safety/medical concerns  Assist level: Dependent - Patient 0% Assistive device: Lite Gait Max distance: 35 ft   Walk 10 feet activity   Assist  Walk 10 feet activity did not occur: Safety/medical concerns  Assist level: Dependent - Patient 0% Assistive device: Lite Gait   Walk 50 feet activity   Assist Walk 50 feet with 2 turns activity did not occur: Safety/medical concerns         Walk 150 feet activity   Assist Walk 150 feet activity did not occur: Safety/medical concerns         Walk 10 feet on uneven surface  activity   Assist Walk 10 feet on uneven surfaces activity did not occur: Safety/medical concerns         Wheelchair     Assist Will patient use wheelchair at discharge?: Yes Type of Wheelchair: Power    Wheelchair assist level: Supervision/Verbal cueing Max wheelchair distance: 40'    Wheelchair 50 feet with 2 turns activity    Assist    Wheelchair 50 feet with 2 turns activity did not occur: Safety/medical concerns   Assist Level: Supervision/Verbal cueing   Wheelchair 150 feet activity     Assist  Wheelchair 150 feet activity did not occur: Safety/medical concerns   Assist Level: Supervision/Verbal cueing   Blood pressure (!) 135/54, pulse 88, temperature 97.9 F (36.6 C), temperature source Oral, resp. rate 20, height 6' (1.829 m), weight 124.5 kg, SpO2 96 %.    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 He is status post tumor resection 04/29/2020.   -Continue CIR therapies including PT, OT, and SLP   ELOS 5/31   -path is positive for Grade 1 meningioma.   -f/u HCT shows improvement, no residual tumor  2. Left posterior tibial vein DVT:  Vascular ultrasound positive for age indeterminate left posterior tibial vein.   -continue xarelto. -antiplatelet therapy: N/A 3. Post-operative pain:tylenol prn  -voltaren gel for bilateral shoulder pain  -continue baclofen 10mg  qhs for cramps and to assist with sleep  Appears to be controlled on 5/24 4. Mood/sleep:  improved  -improved with scheduled trazodone 50mg  and melatonin ---continue  - rx pain, on baclofen too -antipsychotic agents: N/A  -ritalin for activation and initiation--wife worried it's making him more ?nervous, restless. decreased to 5mg  today 5/24    -limiting antispasmodics d/t  neurosedation  5/25 observe today 5. Neuropsych: This patientiscapable of making decisions on hisown behalf. 6. Skin/Wound Care:rash resolved 7. Fluids/Electrolytes/Nutrition:eating well     -Potassium level borderline---supplemented    Potassium 3.7 on 5/24 8. Hypertension.    -Restarted home Flomax HS which will also help with BP   -no norvasc d/t swelling   5/20 lasix increased to 40mg  daily for elevated bp's       -continue metoprolol   Reasonable control 5/25 Vitals:   06/11/20 0410 06/11/20 0803  BP: (!) 135/54   Pulse: (!) 54 88  Resp: 20   Temp: 97.9 F (36.6 C)   SpO2: 96%    9. Hyperlipidemia. Lipitor 10. BPH: increased Flomax to 0.8mg  HS  11. Urinary frequency: mostly incontinent  -UA negative.  timed voiding q2H while awake with urinal- placed nursing order    -continue Flomax to 0.8mg  given continued symptoms and HTN 12. Leukocytosis. Resolved off decadron 13. Constipation:  -continue senna-s bid 14. Confusion, restlessness : ua negative, ucx with insignificant growth   -cbc, bmet wnl   -HCT improved        LOS: 40 days A FACE TO [place] EVALUATION WAS PERFORMED  [name] 06/11/2020, 8:37 AM

## 2020-06-11 NOTE — Progress Notes (Signed)
Physical Therapy Session Note  Patient Details  Name: Anthony Downs MRN: [id number] Date of Birth: [birth year]  Today's Date: 06/11/2020 PT Individual Time: 1104-1200 PT Individual Time Calculation (min): 56 min   Short Term Goals: Week 5:  PT Short Term Goal 1 (Week 5): Patient will perform Stedy transfers with min A of 1 person >75% of the time. PT Short Term Goal 2 (Week 5): Patient will perform bed mobility with min A >75% of the time with use of hospital bed features. PT Short Term Goal 3 (Week 5): Patient will propel w/c in community setting >200 ft with supervision. PT Short Term Goal 4 (Week 5): Patient will perform dependent gait at least 3/7 days for improved activity tolerance and motor planning with functional mobility.  Skilled Therapeutic Interventions/Progress Updates:     Patient in bed with his wife in the room  upon PT arrival. Patient alert and agreeable to PT session. Patient denied pain during session.  Therapeutic Activity: Bed Mobility: Patient performed supine to sit with min A for R lower extremity management and sit to supine with mod A for B lower extremity management. Provided verbal cues for log roll technique, as patient initiated long sit technique spontaneously without success. Transfers: Patient performed Stedy transfer bed<>w/c with supervision-CGA for standing and min A for R hand placement. Provided verbal cues for forward trunk lean to initiate stand and facilitation for hip flexion to initiate sitting.   Wheelchair Mobility:  Patient propelled power wheelchair >150 feet x2 with supervision. Provided verbal cues for pushing the joystick further in the direction he wanted to go for increased speed with propulsion due to hypometria. Patient performed full tilt independently to shift his hips back in the chair following transfers and lowered the seat elevator with min cues for button selection.   Neuromuscular Re-ed: Patient performed the following  activities: -standing with bariatric RW 2x2 min with weight shifts in standing, required mod A of 1 to stand and mod A of 1 to sit with +2 to stabilize RW on second trial, focused on hip/trunk flexion during sit to stands to break up extensor tone, and R lower extremity weight shift with gluteal/quad activation to extend when weight bearing on his R leg -R elbow/wrist/finger extension with shoulder flexion for reaching and without shoulder flexion for weight bearing with facilitation for finger and elbow extension with light touch to posterior elbow and distal end of his 3rd digit  Patient in bed, due to fatigue, with his wife at bedside at end of session with breaks locked, bed alarm set, and all needs within reach.    Therapy Documentation Precautions:  Precautions Precautions: Fall,Other (comment) Precaution Comments: Strong BLE extensor tone, hx of R hand tremor Restrictions Weight Bearing Restrictions: No   Therapy/Group: Individual Therapy  Mauriah Mcmillen L Dorrian Doggett PT, DPT  06/11/2020, 5:30 PM

## 2020-06-11 NOTE — Progress Notes (Signed)
Recreational Therapy Session Note  Patient Details  Name: Anthony Downs MRN: [id number] Date of Birth: August 09, 1950 Today's Date: 06/11/2020  Pain: no c/o Skilled Therapeutic Interventions/Progress Updates: Session focused on activity tolerance, standing tolerance, dynamic standing balance with BUE or 1 UE support during co-treat with OT.  OT assisted pt with bed mobilty and sitting EOB.  Stedy placed, and pt able to stand with close supervision.  Pt stood for 2-3 minutes with closer supervision.  Transitioned to the therapy gym with an emphasis on sit-stands, standing tolerance, weight shifting, R knee control during reaching activity using basketball goal and clothes pins.  Pt required max +2 to complete sit-stands with RW from seated EOM.  Pt with difficulty maintaining midline needing instructional cues and mod-max facilitation to maintain this.  Pt stood in stedy weight shifting R<--> L.  And pt with R knee activation when reaching outside BOS diagonally forward and to the left.   Therapy/Group: Co-Treatment Christie Viscomi 06/11/2020, 2:33 PM

## 2020-06-11 NOTE — Progress Notes (Signed)
Occupational Therapy Session Note  Patient Details  Name: Anthony Downs MRN: [id number] Date of Birth: 11/08/1950  Today's Date: 06/11/2020 OT Individual Time: 0700-0800 OT Individual Time Calculation (min): 60 min    Short Term Goals: Week 5:  OT Short Term Goal 1 (Week 5): Pt will don shirt with MIN A overall OT Short Term Goal 1 - Progress (Week 5): Met OT Short Term Goal 2 (Week 5): Pt will roll in B directions with MOD A OT Short Term Goal 2 - Progress (Week 5): Met OT Short Term Goal 3 (Week 5): Pt will groom at sink with no VC for sequencing to demo improve processing speed OT Short Term Goal 3 - Progress (Week 5): Met OT Short Term Goal 4 (Week 5): Pt will thread 1LE at EOB with A for sitting balance OT Short Term Goal 4 - Progress (Week 5): Progressing toward goal Week 6:  OT Short Term Goal 1 (Week 6): STG= LTG d/t ELOS  Skilled Therapeutic Interventions/Progress Updates:   Pt received in bed this date with no pain but requiring increased time to process and respond to questions with OT waiting up to 20 seconds to repeat question or provide tactile cue to get a response.  ADL:  Pt completes grooming perched in stedy for WB through BLE at sink Pt completes bathing with A to wash B feet and buttocks. Pt would benefit from LHSS to wash B feet. Pt completes UB dressing with MIN A at EOB to thread  LUE  Pt completes LB dressing with A to thread BLE but pt able to advance pants past hips in standing with MOD A for standing balance in stedy Pt completes footwear with total A  Pt completes toileting transfer with S level sit <>stand in stedy totransfer into BSC In shower. Unable to void bowel or bladder on BSC seated despite increased time.   Pt left at end of session in bed with exit alarm on, call light in reach and all needs met   Therapy Documentation Precautions:  Precautions Precautions: Fall,Other (comment) Precaution Comments: Strong BLE extensor tone, hx of R  hand tremor Restrictions Weight Bearing Restrictions: No General:   Vital Signs: Therapy Vitals Temp: 97.9 F (36.6 C) Temp Source: Oral Pulse Rate: (!) 54 Resp: 20 BP: (!) 135/54 Patient Position (if appropriate): Lying Oxygen Therapy SpO2: 96 % O2 Device: Room Air Pain:   ADL: ADL Grooming: Moderate assistance (per pt report) Where Assessed-Grooming: Bed level Upper Body Bathing: Moderate assistance Where Assessed-Upper Body Bathing: Bed level Lower Body Bathing: Dependent (+2 rolling) Where Assessed-Lower Body Bathing: Bed level Upper Body Dressing: Dependent (+2 for rolling to pull down back) Where Assessed-Upper Body Dressing: Bed level Lower Body Dressing: Dependent Where Assessed-Lower Body Dressing: Bed level Toileting: Unable to assess Vision   Perception    Praxis   Exercises:   Other Treatments:     Therapy/Group: Individual Therapy  [name] 06/11/2020, 6:50 AM

## 2020-06-11 NOTE — Progress Notes (Signed)
Occupational Therapy Weekly Progress Note  Patient Details  Name: Anthony Downs MRN: [id number] Date of Birth: 1950-06-30  Beginning of progress report period: Jun 05, 2020 End of progress report period: Jun 11, 2020  Today's Date: 06/11/2020 OT Individual Time: 1300-1400 OT Individual Time Calculation (min): 60 min    Patient has met 3 of 4 short term goals. Pt continues to make good progress toward his OT Goals. He has been more confused/groggy in the morning yet functionally is making progress with ADLs. He is able to don a shirt with CGA and max A to don LB. Pt's RUE is making great progress and is able to functionally grasp and activate shoulder flexion during ADLs.   Patient continues to demonstrate the following deficits: muscle weakness, decreased cardiorespiratoy endurance, decreased initiation, decreased attention, decreased awareness, decreased problem solving, decreased safety awareness, decreased memory and delayed processing and decreased sitting balance, decreased standing balance, decreased postural control, hemiplegia and decreased balance strategies and therefore will continue to benefit from skilled OT intervention to enhance overall performance with BADL.  Patient progressing toward long term goals..  Continue plan of care.  OT Short Term Goals Week 5:  OT Short Term Goal 1 (Week 5): Pt will don shirt with MIN A overall OT Short Term Goal 1 - Progress (Week 5): Met OT Short Term Goal 2 (Week 5): Pt will roll in B directions with MOD A OT Short Term Goal 2 - Progress (Week 5): Met OT Short Term Goal 3 (Week 5): Pt will groom at sink with no VC for sequencing to demo improve processing speed OT Short Term Goal 3 - Progress (Week 5): Met OT Short Term Goal 4 (Week 5): Pt will thread 1LE at EOB with A for sitting balance OT Short Term Goal 4 - Progress (Week 5): Progressing toward goal Week 6:  OT Short Term Goal 1 (Week 6): STG= LTG d/t ELOS  Skilled Therapeutic  Interventions/Progress Updates:    Pt received supine with no c/o pain. Pt completed bed mobility, rolling L with mod A. Max A to come EOB. Pt completed sit to stand in the stedy with only supervision! Pt stood for several minutes in the stedy with close supervision. He was taken by stedy to the therapy gym and to the mat. He completed sit > stand from the mat with max A+2 assist using the RW. First trial he required only mod A for midline orientation when releasing the LUE from the RW. Second trial he required max A to maintain midline orientation with weightbearing through the LLE. Tremor in the RUE continued throughout activity. Seated rest break provided throughout. Pt completed a third trial with focus one equal weightbearing through BLE, very difficult to get pt over RLE with +2 assist to manage L knee locking out in extension and to promote extension in RLE. In the stedy pt worked on rocking R and L to promote weightshift onto RLE- activation observed in the quad. Pt returned to supine with all needs met, bed alarm set.   [place] Stim One applied to pt's R tricep to decrease tone and increase voluntary activation.  330 pulse width 35 Hz pulse rate On 8 sec/ off 8 sec Ramp up/ down 2 sec Symmetrical Biphasic wave form  Max intensity at 500 Ohm load   Therapy Documentation Precautions:  Precautions Precautions: Fall,Other (comment) Precaution Comments: Strong BLE extensor tone, hx of R hand tremor Restrictions Weight Bearing Restrictions: No  Therapy/Group: Individual Therapy  Curtis Sites 06/11/2020, 6:30 AM

## 2020-06-12 MED ORDER — PROPRANOLOL HCL 10 MG PO TABS
10.0000 mg | ORAL_TABLET | Freq: Three times a day (TID) | ORAL | Status: DC
  Administered 2020-06-12 – 2020-06-13 (×3): 10 mg via ORAL
  Filled 2020-06-12 (×3): qty 1

## 2020-06-12 NOTE — Progress Notes (Signed)
Occupational Therapy Session Note  Patient Details  Name: Anthony Downs MRN: [id number] Date of Birth: 06/04/1950  Today's Date: 06/12/2020 OT Individual Time: 0700-0800 OT Individual Time Calculation (min): 60 min   Today's Date: 06/12/2020 OT Individual Time: 1310-1410 OT Individual Time Calculation (min): 60 min   Short Term Goals: Week 5:  OT Short Term Goal 1 (Week 5): Pt will don shirt with MIN A overall OT Short Term Goal 1 - Progress (Week 5): Met OT Short Term Goal 2 (Week 5): Pt will roll in B directions with MOD A OT Short Term Goal 2 - Progress (Week 5): Met OT Short Term Goal 3 (Week 5): Pt will groom at sink with no VC for sequencing to demo improve processing speed OT Short Term Goal 3 - Progress (Week 5): Met OT Short Term Goal 4 (Week 5): Pt will thread 1LE at EOB with A for sitting balance OT Short Term Goal 4 - Progress (Week 5): Progressing toward goal Week 6:  OT Short Term Goal 1 (Week 6): STG= LTG d/t ELOS  Skilled Therapeutic Interventions/Progress Updates:    Session 1:  Pt received in bed beginning breakfast. No pain reported.   ADL: Pt finishes self feeding seated EOB with superviison Pt don shirt EOB with S!!! Min cuing for hemi technique Pt requires MAX A for donnning pants over feeet initiating reacher training. Pt resistant to attempting moving LLE into lpants stating, "this is why I have a wife" continued ed to decrease BOC/wife bending as wife has sensitive back. Pt completes grooming perched in stedy with improved functional reach and BUE use this date.   Therapeutic activity Standing balance in stedy with min to mod A for lateral weight shift L with tactile cues to grab horse shoes. Pt unable to motor plan long throw to target, but after dropping to floor for first round. Second round pt able to motor plan a short throw during second round.   Pt left at end of session in bed with exit alarm on, call light in reach and all needs  met   Session 2:  Pt received in [facility] requiring encouragement and explanation for early session but pt agreeable to shower.  ADL:  Pt completes bathing with A only to wash butocks nad back as pt provided with LHSS to wash B feet Pt completes UB dressing with VC for doffing strategy Pt completes LB dressing with MOD A for pushing pants down hips and VC for use of reacher to take off feet Pt completes footwear with TC required for reacher strategy to doff pants Pt completes shower/Tub transfer with S in stedy     Therapeutic exercise Pt completes 1x1 dowel rod therex for BUE shoulder strengthening required for BADLs/functional transfers as follows with demo cuing and 5 # dowel rod supine in bed  Shoulder flex/ext, shoulder press, horizonal ab/adduct Circles in B directions Elbow flex/ext, chest press Wrist flex/ext  Pt left at end of session in bed with exit alarm on, call light in reach and all needs met   Therapy Documentation Precautions:  Precautions Precautions: Fall,Other (comment) Precaution Comments: Strong BLE extensor tone, hx of R hand tremor Restrictions Weight Bearing Restrictions: No General:   Vital Signs: Therapy Vitals Temp: 98.4 F (36.9 C) Pulse Rate: 64 Resp: 17 BP: (!) 152/61 Patient Position (if appropriate): Lying Oxygen Therapy SpO2: 96 % O2 Device: Nasal Cannula Pain:   ADL: ADL Grooming: Moderate assistance (per pt report) Where Assessed-Grooming: Bed level Upper  Body Bathing: Moderate assistance Where Assessed-Upper Body Bathing: Bed level Lower Body Bathing: Dependent (+2 rolling) Where Assessed-Lower Body Bathing: Bed level Upper Body Dressing: Dependent (+2 for rolling to pull down back) Where Assessed-Upper Body Dressing: Bed level Lower Body Dressing: Dependent Where Assessed-Lower Body Dressing: Bed level Toileting: Unable to assess Vision   Perception    Praxis   Exercises:   Other Treatments:     Therapy/Group:  Individual Therapy  [name] 06/12/2020, 6:46 AM

## 2020-06-12 NOTE — Progress Notes (Signed)
PROGRESS NOTE   Subjective/Complaints: Didn't sleep all that well last night. Still having RUE tremors. Making gains with therapy still however.   ROS: limited due to language/communication   Objective:   CT HEAD WO CONTRAST  Result Date: 06/10/2020 CLINICAL DATA:  Worsening mental status changes. 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 COMPARISON:  MRI 05/01/2020.  CT 04/29/2020. FINDINGS: Brain: No abnormality affects the brainstem or cerebellum. Old left frontal encephalomalacia and lesser encephalomalacia at the left temporal tip, likely relating to old head trauma. Previous vertex craniotomy and cranioplasty related to resection of meningioma. Encephalomalacia at the frontoparietal vertices, more on the left than the right. Resolution of previously seen postoperative edema. No subdural collection. No visible residual or recurrent tumor. No sign of acute ischemic infarction. No hydrocephalus. Vascular: No acute vascular finding. Previous resection of the superior sagittal sinus. Skull: Vertex craniotomy and cranioplasty as noted above. No complicating feature. Sinuses/Orbits: Clear/normal Other: None IMPRESSION: No acute or worrisome finding. Previous vertex craniotomy and cranioplasty for resection of meningioma. No evidence of residual or recurrent tumor. No complicating feature at the region of surgery. Vertex encephalomalacia, left more than right. Chronic left inferior frontal encephalomalacia and mild left temporal tip encephalomalacia probably relating to old head trauma. Electronically Signed   By: [name] M.D.   On: 06/10/2020 17:12   Recent Labs    06/09/20 1155  WBC 8.6  HGB 13.5  HCT 41.5  PLT 250   Recent Labs    06/09/20 1155  NA 139  K  3.7  CL 103  CO2 28  GLUCOSE 105*  BUN 8  CREATININE 1.04  CALCIUM 9.1    Intake/Output Summary (Last 24 hours) at 06/12/2020 0956 Last data filed at 06/11/2020 1749 Gross per 24 hour  Intake 360 ml  Output --  Net 360 ml        Physical Exam: Vital Signs Blood pressure (!) 152/61, pulse 64, temperature 98.4 F (36.9 C), resp. rate 17, height 6' (1.829 m), weight 124.5 kg, SpO2 96 %. Constitutional: No distress . Vital signs reviewed. HEENT: EOMI, oral membranes moist Neck: supple Cardiovascular: RRR without murmur. No JVD    Respiratory/Chest: CTA Bilaterally without wheezes or rales. Normal effort    GI/Abdomen: BS +, non-tender, non-distended Ext: no clubbing, cyanosis, or edema Psych: flat but more engaging.  Skin: scalp wound clean and closed .  Musc: No edema in extremities.  No tenderness in extremities. Neuro: fairly Alert, more delayed today,  Motor: LUE: 4/5 proximal distal LLE: Hip flexion, knee extension 1-2/5, ankle dorsiflexion tr-1/5 RUE : 3-4/5  Right upper extremity with fairly high amplitude. Worse with volitional activity    Assessment/Plan: 1. Functional deficits which require 3+ hours per day of interdisciplinary therapy in a comprehensive inpatient rehab setting.  Physiatrist is providing close team supervision and 24 hour management of active medical problems listed below.  Physiatrist and rehab team continue to assess barriers to discharge/monitor patient progress toward functional and medical goals  Care Tool:  Bathing  Body parts bathed by patient: Left arm,Chest,Abdomen,Front perineal area,Face   Body parts bathed by helper: Right arm,Buttocks,Right upper leg,Left upper leg,Right lower leg,Left lower leg     Bathing assist Assist Level: Maximal Assistance - Patient 24 - 49%     Upper Body Dressing/Undressing Upper body dressing   What is the patient wearing?: Pull over shirt    Upper body assist Assist Level: Moderate Assistance  - Patient 50 - 74%    Lower Body Dressing/Undressing Lower body dressing      What is the patient wearing?: Pants     Lower body assist Assist for lower body dressing: Maximal Assistance - Patient 25 - 49%     Toileting Toileting    Toileting assist Assist for toileting: 2 Helpers     Transfers Chair/bed transfer  Transfers assist  Chair/bed transfer activity did not occur: Safety/medical concerns  Chair/bed transfer assist level: Dependent - mechanical lift Chair/bed transfer assistive device: Other (stedy)   Locomotion Ambulation   Ambulation assist   Ambulation activity did not occur: Safety/medical concerns  Assist level: Dependent - Patient 0% Assistive device: Lite Gait Max distance: 35 ft   Walk 10 feet activity   Assist  Walk 10 feet activity did not occur: Safety/medical concerns  Assist level: Dependent - Patient 0% Assistive device: Lite Gait   Walk 50 feet activity   Assist Walk 50 feet with 2 turns activity did not occur: Safety/medical concerns         Walk 150 feet activity   Assist Walk 150 feet activity did not occur: Safety/medical concerns         Walk 10 feet on uneven surface  activity   Assist Walk 10 feet on uneven surfaces activity did not occur: Safety/medical concerns         Wheelchair     Assist Will patient use wheelchair at discharge?: Yes Type of Wheelchair: Power    Wheelchair assist level: Supervision/Verbal cueing Max wheelchair distance: 40'    Wheelchair 50 feet with 2 turns activity    Assist    Wheelchair 50 feet with 2 turns activity did not occur: Safety/medical concerns   Assist Level: Supervision/Verbal cueing   Wheelchair 150 feet activity     Assist  Wheelchair 150 feet activity did not occur: Safety/medical concerns   Assist Level: Supervision/Verbal cueing   Blood pressure (!) 152/61, pulse 64, temperature 98.4 F (36.9 C), resp. rate 17, height 6' (1.829 m),  weight 124.5 kg, SpO2 96 %.    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 He is status post tumor resection 04/29/2020.   -Continue CIR therapies including PT, OT, and SLP   ELOS 5/31   -path is positive for Grade 1 meningioma.   -f/u HCT shows improvement, no residual tumor--reviewed with pt/wife today  5/26--will start propranolol today to see if we can reduce RUE tremor 2. Left posterior tibial vein DVT:  Vascular ultrasound positive for age indeterminate left posterior tibial vein.   -continue xarelto. -antiplatelet therapy: N/A 3. Post-operative pain:tylenol prn  -voltaren gel for bilateral shoulder pain  -continue baclofen 10mg  qhs for cramps and to assist with sleep  Appears to be controlled on 5/24 4. Mood/sleep:  improved  -improved with scheduled trazodone 50mg  and melatonin ---continue  - rx pain, on baclofen too -antipsychotic agents: N/A  -  ritalin for activation and initiation--wife worried it's making him more ?nervous, restless. decreased to 5mg  today 5/24---with less restlessness?    -limiting antispasmodics d/t neurosedation    5. Neuropsych: This patientiscapable of making decisions on hisown behalf. 6. Skin/Wound Care:rash resolved 7. Fluids/Electrolytes/Nutrition:eating well     -Potassium level borderline---supplemented    Potassium 3.7 on 5/24 8. Hypertension.    -Restarted home Flomax HS which will also help with BP   -no norvasc d/t swelling   5/20 lasix increased to 40mg  daily for elevated bp's   5/26 changing metoprolol to propranolol d/t tremor Vitals:   06/11/20 2016 06/12/20 0432  BP: (!) 149/63 (!) 152/61  Pulse: 64 64  Resp: 18 17  Temp: 98.4 F (36.9 C) 98.4 F (36.9 C)  SpO2: 96% 96%   9.  Hyperlipidemia. Lipitor 10. BPH: increased Flomax to 0.8mg  HS  11. Urinary frequency: mostly incontinent  -UA negative.  timed voiding q2H while awake with urinal- placed nursing order   -continue Flomax to 0.8mg  given continued symptoms and HTN 12. Leukocytosis. Resolved off decadron 13. Constipation:  -continue senna-s bid 14. Confusion, restlessness : improved, intermittent.  -ua negative, ucx with insignificant growth   -cbc, bmet wnl   -HCT improved        LOS: 41 days A FACE TO FACE EVALUATION WAS PERFORMED  [name] 06/12/2020, 9:56 AM

## 2020-06-12 NOTE — Progress Notes (Signed)
Physical Therapy Weekly Progress Note  Patient Details  Name: Anthony Downs MRN: [id number] Date of Birth: 20-Sep-1950  Beginning of progress report period: Jun 05, 2020 End of progress report period: Jun 12, 2020  Today's Date: 06/12/2020 PT Individual Time: 0905-1005 PT Individual Time Calculation (min): 60 min   Patient has met 3 of 3 short term goals.  Patient with steady progress this week. Currently requires min A for bed mobility and Stedy transfers >75% of the time and has participated in therapeutic gait training in Lite Gait 3/7 days this week demonstrating increased initiation with L lower extremity with stepping. Also progressed to min A standing balance with RW with B upper extremity support.   Patient continues to demonstrate the following deficits decreased cardiorespiratoy endurance, abnormal tone, decreased coordination and decreased motor planning, decreased midline orientation and decreased attention to right, decreased initiation, decreased attention, decreased awareness, decreased problem solving, decreased memory and delayed processing and decreased sitting balance, decreased standing balance, decreased postural control and decreased balance strategies and therefore will continue to benefit from skilled PT intervention to increase functional independence with mobility.  Patient progressing toward long term goals..  Continue plan of care.  PT Short Term Goals Week 5:  PT Short Term Goal 1 (Week 5): Patient will perform Stedy transfers with min A of 1 person >75% of the time. PT Short Term Goal 1 - Progress (Week 5): Met PT Short Term Goal 2 (Week 5): Patient will perform bed mobility with min A >75% of the time with use of hospital bed features. PT Short Term Goal 2 - Progress (Week 5): Met PT Short Term Goal 3 (Week 5): Patient will propel w/c in community setting >200 ft with supervision. PT Short Term Goal 3 - Progress (Week 5): Met PT Short Term Goal 4 (Week 5):  Patient will perform dependent gait at least 3/7 days for improved activity tolerance and motor planning with functional mobility. PT Short Term Goal 4 - Progress (Week 5): Met Week 6:  PT Short Term Goal 1 (Week 6): STG=LTG due to ELOS.  Skilled Therapeutic Interventions/Progress Updates:     Patient sitting EOB with RT preparing to [facility] transfer for co-tx upon PT arrival. Patient alert and agreeable to PT session. Patient denied pain during session. Patient's wife present throughout session.   Therapeutic Activity: Bed Mobility: Patient performed sit to supine with mod A provided by his wife. Provided verbal cues for body mechanic for patient's wife for safe lifting technique, and patient to bend his knees to his chest to lift his legs onto the bed. Patient's wife cued patient to use his L elbow to lower his trunk onto the bed. Transfers: Patient performed sit to stand x3 in the [place] with supervision and stand to sit in [place] with min/mod A to promote sitting far back in the w/c. Provided verbal cues for trunk and hip flexion to initiate stand and sit.  Neuromuscular Re-ed: Patient performed the following activities: Patient ambulated 40 feet in the Lite Gait with ~50% BWS with total A for B limb advancement with intermittent limited initiation from patient and a third person assist to propel the device. Performed activity for improved motor control with functional reciprocal stepping pattern and increased activity tolerance with a functional activity.   Provided seated rest breaks throughout session due to decreased activity tolerance. Discussed d/c planning and plan for increased transfers with wife and staff assisting in preparation for d/c next week. Patient and wife agreeable to  transfer patient OOB and back to bed for lunch today due to long break between therapies. LPN made aware that patient's wife is to lead transfer with nursing staff present for safety. Patient's wife in agreement to  call fur nursing assist for transfers.   Patient in bed at end of session with breaks locked, bed alarm set, and all needs within reach.   Therapy Documentation Precautions:  Precautions Precautions: Fall,Other (comment) Precaution Comments: Strong BLE extensor tone, hx of R hand tremor Restrictions Weight Bearing Restrictions: No General:     Therapy/Group: Individual Therapy  Omaya Nieland L Ronie Fleeger PT, DPT  06/12/2020, 12:33 PM

## 2020-06-13 LAB — CBC
HCT: 38.7 % — ABNORMAL LOW (ref 39.0–52.0)
Hemoglobin: 12.3 g/dL — ABNORMAL LOW (ref 13.0–17.0)
MCH: 30.1 pg (ref 26.0–34.0)
MCHC: 31.8 g/dL (ref 30.0–36.0)
MCV: 94.6 fL (ref 80.0–100.0)
Platelets: 205 10*3/uL (ref 150–400)
RBC: 4.09 MIL/uL — ABNORMAL LOW (ref 4.22–5.81)
RDW: 12.9 % (ref 11.5–15.5)
WBC: 6.8 10*3/uL (ref 4.0–10.5)
nRBC: 0 % (ref 0.0–0.2)

## 2020-06-13 LAB — BASIC METABOLIC PANEL
Anion gap: 4 — ABNORMAL LOW (ref 5–15)
BUN: 5 mg/dL — ABNORMAL LOW (ref 8–23)
CO2: 30 mmol/L (ref 22–32)
Calcium: 8.8 mg/dL — ABNORMAL LOW (ref 8.9–10.3)
Chloride: 105 mmol/L (ref 98–111)
Creatinine, Ser: 0.98 mg/dL (ref 0.61–1.24)
GFR, Estimated: >60 mL/min (ref >60)
Glucose, Bld: 147 mg/dL — ABNORMAL HIGH (ref 70–99)
Potassium: 3.4 mmol/L — ABNORMAL LOW (ref 3.5–5.1)
Sodium: 139 mmol/L (ref 135–145)

## 2020-06-13 MED ORDER — PROPRANOLOL HCL 20 MG PO TABS
20.0000 mg | ORAL_TABLET | Freq: Three times a day (TID) | ORAL | Status: DC
  Administered 2020-06-13 – 2020-06-17 (×12): 20 mg via ORAL
  Filled 2020-06-13 (×12): qty 1
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
## 2020-06-13 NOTE — Progress Notes (Signed)
Occupational Therapy Session Note  Patient Details  Name: Anthony Downs MRN: [id number] Date of Birth: October 25, 1950  Today's Date: 06/13/2020 OT Individual Time: 5093-2671 and 2458-0998 OT Individual Time Calculation (min): 56 min and 28 min   Short Term Goals: Week 1:  OT Short Term Goal 1 (Week 1): Pt will roll with MAX A of 1 caregiver to decrease BOC for toileitng OT Short Term Goal 1 - Progress (Week 1): Met OT Short Term Goal 2 (Week 1): Pt will tolerate laying sidelying on R 1x per day to assist with tone management OT Short Term Goal 2 - Progress (Week 1): Met OT Short Term Goal 3 (Week 1): Pt will recall hemi dressing techniques with MOD VC OT Short Term Goal 3 - Progress (Week 1): Progressing toward goal OT Short Term Goal 4 (Week 1): Pt will sit EOB 10 min with mod A in prep for ADL OT Short Term Goal 4 - Progress (Week 1): Met Week 2:  OT Short Term Goal 1 (Week 2): Pt will maintain dynamic sitting balnace in min ranges outside BOS with MIN A overall for 5 min OT Short Term Goal 1 - Progress (Week 2): Met OT Short Term Goal 2 (Week 2): Pt will recall hemi dressing techniques wiht MOD VC OT Short Term Goal 2 - Progress (Week 2): Met OT Short Term Goal 3 (Week 2): Pt will complete 2 steps of UB dressing OT Short Term Goal 3 - Progress (Week 2): Progressing toward goal OT Short Term Goal 4 (Week 2): Pt will groom with MIN A OT Short Term Goal 4 - Progress (Week 2): Met Week 3:  OT Short Term Goal 1 (Week 3): Pt will complete 2/4 steps of UB dressing OT Short Term Goal 1 - Progress (Week 3): Met OT Short Term Goal 2 (Week 3): pt will mange trunk from supine>sit with MOD A consistently OT Short Term Goal 2 - Progress (Week 3): Progressing toward goal OT Short Term Goal 3 (Week 3): Pt will use RUE as stabilizer with MIN A consistently OT Short Term Goal 3 - Progress (Week 3): Met  Skilled Therapeutic Interventions/Progress Updates:    Session 1: Pt greeted at time of  session up in power chair with wife present, needing to use urinal. Wife assisting with placement and hygiene, therapist stepping back per pt request for privacy. When finished, pt drove room <> gym via power chair with Min/Mod cues for avoiding obstacles. In gym, sit <> stand activity in stedy with Min/CGA for sit > stand and Supervision to stand from perch position for dynamic standing reaching with BUEs (attempted to have use RUE mostly, switched to LUE when fatigued) for clipping clothespins on basketball net. Rest breaks in perched position in [place]. Weight bearing through RUE as well to try to decrease tremors. Pt driving back to room via power chair but therapist resuming part of the way d/t fatigue. Once in room, pt adamant to go back to bed, Stedy via wife performing and therapist spotting as 2+ for trunk control to bed. Sit > supine Max A for LB. Scoot up in bed 2+ assist. Alarm on call bell in reach.   Session 2: Pt greeted at time of session supine in bed resting agreeable to OT session, wife in room and remained throughout session. Initiated BUE there ex and pt needing to urinate and wife assisted with urinal, pt requesting a few minutes for privacy. Resumed BUE there ex with 5# dowel for: chest  press, ventral raises, lateral rows, bicep curl for 1x10-15 reps with rest break in between. Pt in bed resting call bell in reach all needs met and wife present. Encouraged pt recall throughout session to remember there ex from previous OT sessions.   Therapy Documentation Precautions:  Precautions Precautions: Fall,Other (comment) Precaution Comments: Strong BLE extensor tone, hx of R hand tremor Restrictions Weight Bearing Restrictions: No     Therapy/Group: Individual Therapy  Viona Gilmore 06/13/2020, 7:28 AM

## 2020-06-13 NOTE — Progress Notes (Signed)
Physical Therapy Session Note  Patient Details  Name: Anthony Downs MRN: [id number] Date of Birth: 12/04/1950  Today's Date: 06/13/2020 PT Individual Time: 0930-1025 PT Individual Time Calculation (min): 55 min   Short Term Goals: Week 1:  PT Short Term Goal 1 (Week 1): Pt will perform R/L rolling in bed with +2 mod assist using bed features PT Short Term Goal 1 - Progress (Week 1): Partly met (roll R with mod A +1, roll L with max A +2) PT Short Term Goal 2 (Week 1): Pt will tolerate sitting EOB for at least 15 minutes during therapy PT Short Term Goal 2 - Progress (Week 1): Progressing toward goal PT Short Term Goal 3 (Week 1): Pt will maintain dynamic sitting balance EOM with max assist for at least 3 minutes PT Short Term Goal 3 - Progress (Week 1): Progressing toward goal PT Short Term Goal 4 (Week 1): Pt will perform supine<>sitting with +2 max assist PT Short Term Goal 4 - Progress (Week 1): Met PT Short Term Goal 5 (Week 1): Pt will tolerate sitting EOB for at least 15 minutes during therapy Week 2:  PT Short Term Goal 1 (Week 2): Pt will maintain dynamic sitting balance EOM with max assist for at least 3 minutes PT Short Term Goal 1 - Progress (Week 2): Met PT Short Term Goal 2 (Week 2): Pt will perform bed mobility with max A +1 consistently. PT Short Term Goal 2 - Progress (Week 2): Progressing toward goal PT Short Term Goal 3 (Week 2): Patient will tolerate dependent lower extremity weight bearing >5 min using LRAD. PT Short Term Goal 3 - Progress (Week 2): Met PT Short Term Goal 4 (Week 2): Patient will initiate power mobility. PT Short Term Goal 4 - Progress (Week 2): Met Week 3:  PT Short Term Goal 1 (Week 3): Pt will perform bed mobility with max A +1 consistently. PT Short Term Goal 1 - Progress (Week 3): Met PT Short Term Goal 2 (Week 3): Patient will perform dynamic sitting balance at least 5 min with min A. PT Short Term Goal 2 - Progress (Week 3): Met PT Short  Term Goal 3 (Week 3): Patient will perform pressure relief in power w/c indepenently. PT Short Term Goal 3 - Progress (Week 3): Met PT Short Term Goal 4 (Week 3): Patient will propel power w/c on outdoor/community surfaces >150 ft. PT Short Term Goal 4 - Progress (Week 3): Progressing toward goal PT Short Term Goal 5 (Week 3): Patient will perform dependent standing >8 min. PT Short Term Goal 5 - Progress (Week 3): Met Week 4:  PT Short Term Goal 1 (Week 4): Patient will initiate dependent gait. PT Short Term Goal 1 - Progress (Week 4): Met PT Short Term Goal 2 (Week 4): Patient will perform sit to/from stand with max A of 1 person using LRAD. PT Short Term Goal 2 - Progress (Week 4): Met PT Short Term Goal 3 (Week 4): Patient will perform level transfers with mod A using LRAD. PT Short Term Goal 3 - Progress (Week 4): Discontinued (comment) (dicontinue due to lack of progress with motor planning deficits, focus on Stedy transfers for reduced burden of care at d/c.) Week 5:  PT Short Term Goal 1 (Week 5): Patient will perform Stedy transfers with min A of 1 person >75% of the time. PT Short Term Goal 1 - Progress (Week 5): Met PT Short Term Goal 2 (Week 5): Patient will perform bed   mobility with min A >75% of the time with use of hospital bed features. PT Short Term Goal 2 - Progress (Week 5): Met PT Short Term Goal 3 (Week 5): Patient will propel w/c in community setting >200 ft with supervision. PT Short Term Goal 3 - Progress (Week 5): Met PT Short Term Goal 4 (Week 5): Patient will perform dependent gait at least 3/7 days for improved activity tolerance and motor planning with functional mobility. PT Short Term Goal 4 - Progress (Week 5): Met  Skilled Therapeutic Interventions/Progress Updates:    Pain:  Pt reports no pain.  Treatment to tolerance.  Rest breaks and repositioning as needed.  Pt initially supine and agreeable to treatment session w/focus on seating and positioning, wc  skills, family training.  Pt no longer w/wc in room, wife reports removed by vendor for transport to home in anticipation of DC Tues/Holiday Weekend.  Therapist obtained in house [place] w/Jay 2 cushion and spent majority of sesssion making adjustments to wc for comfort, instruction w/controls.  Wife present for full session.  Wife able to assist pt safely and w/good mechanics/use of hospital bed features as well as PwC features for assisting pt w/supine to side to sit, Sit to stand using Steady, bed to wc using steady w/no assistance or cueing from therapist.  Manages wc parts well. Pt then instructed w/controls and propels Independently to gym for adjustments to wc by therapist.   Pt wife requesting assistance contacting Corene Cornea from [place] to discuss possible assist w/transportation options.  Therapist facilitated contact between wife/[place]. Pt wife then assisted pt gym to room with no assistance needed.  Able to switch between drive and tilt modes without assist as needed.  Pt wife able to assist w/positiuoning wc in room.  Pt left oob in wc w/alarm belt set and needs in reach    Therapy Documentation Precautions:  Precautions Precautions: Fall,Other (comment) Precaution Comments: Strong BLE extensor tone, hx of R hand tremor Restrictions Weight Bearing Restrictions: No   Therapy/Group: Individual Therapy  Callie Fielding, [place] 06/13/2020, 12:45 PM

## 2020-06-13 NOTE — Progress Notes (Signed)
Patient ID: JAXTON CASALE, male   DOB: [birth year], 70 y.o.   MRN: [id number]  SW scheduled ambulance transport to home on 5/31 at [place] with [facility] [phone]).  SW left community transportation services in room for pt wife. SW met with pt and pt wife in therapy gym to inform on limited resource options in her area in which she can utilize [employer] transportation; [facility] not an option since no Medicaid, and [employer] (pt not listed as an Therapist, occupational). SW encouraged her to call Medicare to see if they have an transportation services. She intends to explore other resources that may be available, and considering non-ambulance transport or renting a w/c van.  Loralee Pacas, MSW, [place] Office: [phone] Cell: [phone] Fax: [phone]
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
## 2020-06-13 NOTE — Progress Notes (Signed)
PROGRESS NOTE   Subjective/Complaints: Had a reasonable night. Just had a bm. Wife about to help clean him up.   ROS: Limited due to cognitive/behavioral    Objective:   No results found. Recent Labs    06/13/20 0458  WBC 6.8  HGB 12.3*  HCT 38.7*  PLT 205   Recent Labs    06/13/20 0458  NA 139  K 3.4*  CL 105  CO2 30  GLUCOSE 147*  BUN 5*  CREATININE 0.98  CALCIUM 8.8*    Intake/Output Summary (Last 24 hours) at 06/13/2020 1038 Last data filed at 06/13/2020 0830 Gross per 24 hour  Intake 740 ml  Output --  Net 740 ml        Physical Exam: Vital Signs Blood pressure (!) 161/85, pulse 75, temperature 98.7 F (37.1 C), temperature source Oral, resp. rate 16, height 6' (1.829 m), weight 124.5 kg, SpO2 96 %. Constitutional: No distress . Vital signs reviewed. HEENT: EOMI, oral membranes moist Neck: supple Cardiovascular: RRR without murmur. No JVD    Respiratory/Chest: CTA Bilaterally without wheezes or rales. Normal effort    GI/Abdomen: BS +, non-tender, non-distended Ext: no clubbing, cyanosis, or edema Psych: pleasant and cooperative  Skin: scalp wound healed .  Musc: No edema in extremities.  No tenderness in extremities. Neuro: fairly Alert, more delayed today,  Motor: LUE: 4/5 proximal distal LLE: Hip flexion, knee extension 1-2/5, ankle dorsiflexion tr-1/5 RUE : 3-4/5  Right upper extremity tremor with sl lower amplitude today    Assessment/Plan: 1. Functional deficits which require 3+ hours per day of interdisciplinary therapy in a comprehensive inpatient rehab setting.  Physiatrist is providing close team supervision and 24 hour management of active medical problems listed below.  Physiatrist and rehab team continue to assess barriers to discharge/monitor patient progress toward functional and medical goals  Care Tool:  Bathing    Body parts bathed by patient: Right arm,Left  arm,Chest,Abdomen,Front perineal area,Right upper leg,Left upper leg,Right lower leg,Left lower leg,Face   Body parts bathed by helper: Buttocks     Bathing assist Assist Level: Minimal Assistance - Patient > 75%     Upper Body Dressing/Undressing Upper body dressing   What is the patient wearing?: Pull over shirt    Upper body assist Assist Level: Supervision/Verbal cueing    Lower Body Dressing/Undressing Lower body dressing      What is the patient wearing?: Pants     Lower body assist Assist for lower body dressing: Maximal Assistance - Patient 25 - 49%     Toileting Toileting    Toileting assist Assist for toileting: 2 Helpers     Transfers Chair/bed transfer  Transfers assist  Chair/bed transfer activity did not occur: Safety/medical concerns  Chair/bed transfer assist level: Dependent - mechanical lift Chair/bed transfer assistive device: Other (Stedy)   Locomotion Ambulation   Ambulation assist   Ambulation activity did not occur: Safety/medical concerns  Assist level: Dependent - Patient 0% Assistive device: Lite Gait Max distance: 40 ft   Walk 10 feet activity   Assist  Walk 10 feet activity did not occur: Safety/medical concerns  Assist level: Dependent - Patient 0% Assistive device: Lite  Gait   Walk 50 feet activity   Assist Walk 50 feet with 2 turns activity did not occur: Safety/medical concerns         Walk 150 feet activity   Assist Walk 150 feet activity did not occur: Safety/medical concerns         Walk 10 feet on uneven surface  activity   Assist Walk 10 feet on uneven surfaces activity did not occur: Safety/medical concerns         Wheelchair     Assist Will patient use wheelchair at discharge?: Yes Type of Wheelchair: Power    Wheelchair assist level: Supervision/Verbal cueing Max wheelchair distance: 40'    Wheelchair 50 feet with 2 turns activity    Assist    Wheelchair 50 feet with 2  turns activity did not occur: Safety/medical concerns   Assist Level: Supervision/Verbal cueing   Wheelchair 150 feet activity     Assist  Wheelchair 150 feet activity did not occur: Safety/medical concerns   Assist Level: Supervision/Verbal cueing   Blood pressure (!) 161/85, pulse 75, temperature 98.7 F (37.1 C), temperature source Oral, resp. rate 16, height 6' (1.829 m), weight 124.5 kg, SpO2 96 %.    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 He is status post tumor resection 04/29/2020.   -Continue CIR therapies including PT, OT, and SLP   ELOS 5/31  -f/u HCT shows improvement, no residual tumor--reviewed with pt/wife today   -propranolol trial for RUE tremor 2. Left posterior tibial vein DVT:  Vascular ultrasound positive for age indeterminate left posterior tibial vein.   -continue xarelto. -antiplatelet therapy: N/A 3. Post-operative pain:tylenol prn  -voltaren gel for bilateral shoulder pain  -continue baclofen 10mg  qhs for cramps and to assist with sleep  Appears to be controlled on 5/24 4. Mood/sleep:  improved  -improved with scheduled trazodone 50mg  and melatonin ---continue  - rx pain, on baclofen too -antipsychotic agents: N/A  -ritalin for activation and initiation--wife worried it's making him more ?nervous, restless. decreased to 5mg   5/24---with less restlessness?    -limiting antispasmodics d/t neurosedation    5. Neuropsych: This patientiscapable of making decisions on hisown behalf. 6. Skin/Wound Care:rash resolved 7. Fluids/Electrolytes/Nutrition:eating well     -Potassium level borderline---supplemented    5/27 3.4---increase supplementation 8. Hypertension.    -Restarted home Flomax HS which will also help with BP   -no  norvasc d/t swelling   5/20 lasix increased to 40mg  daily for elevated bp's   5/26 changed metoprolol to propranolol d/t tremor   5/27 elevated--increase propranolol to 20mg  tid Vitals:   06/12/20 1946 06/13/20 0428  BP: (!) 156/98 (!) 161/85  Pulse: 69 75  Resp: 18 16  Temp: 98.3 F (36.8 C) 98.7 F (37.1 C)  SpO2: 96% 96%   9. Hyperlipidemia. Lipitor 10. BPH: increased Flomax to 0.8mg  HS  11. Urinary frequency: mostly incontinent  -UA negative.  timed voiding q2H while awake with urinal- placed nursing order   -continue Flomax to 0.8mg  given continued symptoms and HTN 12. Leukocytosis. Resolved off decadron 13. Constipation:  -continue senna-s bid 14. Confusion, restlessness : improved, intermittent, likely behavioral component  -labs ok   -HCT improved        LOS: 42 days A FACE TO FACE EVALUATION WAS PERFORMED  [name] 06/13/2020, 10:38 AM

## 2020-06-14 MED ORDER — POTASSIUM CHLORIDE CRYS ER 20 MEQ PO TBCR
20.0000 meq | EXTENDED_RELEASE_TABLET | Freq: Every day | ORAL | Status: DC
  Administered 2020-06-14 – 2020-06-17 (×4): 20 meq via ORAL
  Filled 2020-06-14 (×4): qty 1

## 2020-06-14 NOTE — Progress Notes (Addendum)
PROGRESS NOTE   Subjective/Complaints: No problems over night. Seems to be in good spirits. Slept ok. Got emotional when talking about going home  ROS: Patient denies fever, rash, sore throat, blurred vision, nausea, vomiting, diarrhea, cough, shortness of breath or chest pain, joint or back pain, headache, or mood change.   Objective:   No results found. Recent Labs    06/13/20 0458  WBC 6.8  HGB 12.3*  HCT 38.7*  PLT 205   Recent Labs    06/13/20 0458  NA 139  K 3.4*  CL 105  CO2 30  GLUCOSE 147*  BUN 5*  CREATININE 0.98  CALCIUM 8.8*    Intake/Output Summary (Last 24 hours) at 06/14/2020 1057 Last data filed at 06/14/2020 0740 Gross per 24 hour  Intake 660 ml  Output 200 ml  Net 460 ml        Physical Exam: Vital Signs Blood pressure 134/73, pulse 75, temperature 98.2 F (36.8 C), temperature source Oral, resp. rate 18, height 6' (1.829 m), weight 124.5 kg, SpO2 93 %. Constitutional: No distress . Vital signs reviewed. HEENT: EOMI, oral membranes moist Neck: supple Cardiovascular: RRR without murmur. No JVD    Respiratory/Chest: CTA Bilaterally without wheezes or rales. Normal effort    GI/Abdomen: BS +, non-tender, non-distended Ext: no clubbing, cyanosis, or edema Psych: pleasant and cooperative Skin: scalp wound healed .  Musc: No edema in extremities.  No tenderness in extremities. Neuro: fairly Alert, more delayed today,  Motor: LUE: 4/5 proximal distal LLE: Hip flexion, knee extension 1+/5, ankle dorsiflexion tr-1/5 RUE : 3-4/5  Right upper extremity tremor diminished    Assessment/Plan: 1. Functional deficits which require 3+ hours per day of interdisciplinary therapy in a comprehensive inpatient rehab setting.  Physiatrist is providing close team supervision and 24 hour management of active medical problems listed below.  Physiatrist and rehab team continue to assess barriers to  discharge/monitor patient progress toward functional and medical goals  Care Tool:  Bathing    Body parts bathed by patient: Right arm,Left arm,Chest,Abdomen,Front perineal area,Right upper leg,Left upper leg,Right lower leg,Left lower leg,Face   Body parts bathed by helper: Buttocks     Bathing assist Assist Level: Minimal Assistance - Patient > 75%     Upper Body Dressing/Undressing Upper body dressing   What is the patient wearing?: Pull over shirt    Upper body assist Assist Level: Supervision/Verbal cueing    Lower Body Dressing/Undressing Lower body dressing      What is the patient wearing?: Pants     Lower body assist Assist for lower body dressing: Maximal Assistance - Patient 25 - 49%     Toileting Toileting    Toileting assist Assist for toileting: 2 Helpers     Transfers Chair/bed transfer  Transfers assist  Chair/bed transfer activity did not occur: Safety/medical concerns  Chair/bed transfer assist level: Dependent - mechanical lift Chair/bed transfer assistive device: Other (Stedy)   Locomotion Ambulation   Ambulation assist   Ambulation activity did not occur: Safety/medical concerns  Assist level: Dependent - Patient 0% Assistive device: Lite Gait Max distance: 40 ft   Walk 10 feet activity   Assist  Walk 10 feet activity did not occur: Safety/medical concerns  Assist level: Dependent - Patient 0% Assistive device: Lite Gait   Walk 50 feet activity   Assist Walk 50 feet with 2 turns activity did not occur: Safety/medical concerns         Walk 150 feet activity   Assist Walk 150 feet activity did not occur: Safety/medical concerns         Walk 10 feet on uneven surface  activity   Assist Walk 10 feet on uneven surfaces activity did not occur: Safety/medical concerns         Wheelchair     Assist Will patient use wheelchair at discharge?: Yes Type of Wheelchair: Power    Wheelchair assist level:  Supervision/Verbal cueing Max wheelchair distance: 40'    Wheelchair 50 feet with 2 turns activity    Assist    Wheelchair 50 feet with 2 turns activity did not occur: Safety/medical concerns   Assist Level: Supervision/Verbal cueing   Wheelchair 150 feet activity     Assist  Wheelchair 150 feet activity did not occur: Safety/medical concerns   Assist Level: Supervision/Verbal cueing   Blood pressure 134/73, pulse 75, temperature 98.2 F (36.8 C), temperature source Oral, resp. rate 18, height 6' (1.829 m), weight 124.5 kg, SpO2 93 %.    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 He is status post tumor resection 04/29/2020.   -Continue CIR therapies including PT, OT, and SLP   ELOS 5/31  -f/u HCT shows improvement, no residual tumor    -propranolol trial for RUE tremor seems to be helping 2. Left posterior tibial vein DVT:  Vascular ultrasound positive for age indeterminate left posterior tibial vein.   -continue xarelto. -antiplatelet therapy: N/A 3. Post-operative pain:tylenol prn  -voltaren gel for bilateral shoulder pain  -continue baclofen 10mg  qhs for cramps and to assist with sleep  Appears to be controlled on 5/24 4. Mood/sleep:  improved  -improved with scheduled trazodone 50mg  and melatonin ---continue  - rx pain, on baclofen too -antipsychotic agents: N/A  -ritalin for activation and initiation--wife worried it's making him more ?nervous, restless. decreased to 5mg   5/24---with less restlessness?    -limiting antispasmodics d/t neurosedation    5. Neuropsych: This patientiscapable of making decisions on hisown behalf. 6. Skin/Wound Care:rash resolved 7. Fluids/Electrolytes/Nutrition:eating well     -Potassium level  borderline---supplemented    5/27 3.4---resume supplementation 5/28 8. Hypertension.    -Restarted home Flomax HS which will also help with BP   -no norvasc d/t swelling   5/20 lasix increased to 40mg  daily for elevated bp's   5/26 changed metoprolol to propranolol d/t tremor   5/28 observe with propranolol at 20mg  tid----appears improved Vitals:   06/13/20 2040 06/14/20 0344  BP: (!) 165/82 134/73  Pulse: 75 75  Resp: 18 18  Temp: 97.7 F (36.5 C) 98.2 F (36.8 C)  SpO2: 97% 93%   9. Hyperlipidemia. Lipitor 10. BPH: increased Flomax to 0.8mg  HS  11. Urinary frequency: mostly incontinent  -UA negative.  timed voiding q2H while awake with urinal- placed nursing order   -continue Flomax to 0.8mg  given continued symptoms and HTN 12. Leukocytosis. Resolved off decadron 13. Constipation:  -continue senna-s bid 14. Confusion, restlessness : improved, intermittent, likely behavioral component  -labs ok   -HCT improved  LOS: 43 days A FACE TO [place] 06/14/2020, 10:57 AM

## 2020-06-15 LAB — URINALYSIS, COMPLETE (UACMP) WITH MICROSCOPIC
Bacteria, UA: NONE SEEN
Bilirubin Urine: NEGATIVE
Glucose, UA: NEGATIVE mg/dL
Hgb urine dipstick: NEGATIVE
Ketones, ur: NEGATIVE mg/dL
Leukocytes,Ua: NEGATIVE
Nitrite: NEGATIVE
Protein, ur: NEGATIVE mg/dL
Specific Gravity, Urine: 1.009 (ref 1.005–1.030)
pH: 5 (ref 5.0–8.0)

## 2020-06-15 NOTE — Progress Notes (Signed)
PROGRESS NOTE   Subjective/Complaints: Pt denies any pain or problems. Temp of 100.1 yesterday. Denies cough or changes in bladder habits.   ROS: Limited due to cognitive/behavioral    Objective:   No results found. Recent Labs    06/13/20 0458  WBC 6.8  HGB 12.3*  HCT 38.7*  PLT 205   Recent Labs    06/13/20 0458  NA 139  K 3.4*  CL 105  CO2 30  GLUCOSE 147*  BUN 5*  CREATININE 0.98  CALCIUM 8.8*    Intake/Output Summary (Last 24 hours) at 06/15/2020 0910 Last data filed at 06/15/2020 0720 Gross per 24 hour  Intake 720 ml  Output --  Net 720 ml        Physical Exam: Vital Signs Blood pressure (!) 168/97, pulse 77, temperature 98 F (36.7 C), temperature source Oral, resp. rate 20, height 6' (1.829 m), weight 124.5 kg, SpO2 97 %. Constitutional: No distress . Vital signs reviewed. HEENT: EOMI, oral membranes moist Neck: supple Cardiovascular: RRR without murmur. No JVD    Respiratory/Chest: CTA Bilaterally without wheezes or rales. Normal effort    GI/Abdomen: BS +, non-tender, non-distended Ext: no clubbing, cyanosis, or edema Psych: fairly engaging Skin: scalp wound healed .  Musc: No edema in extremities.  No tenderness in extremities. Neuro: fairly Alert, more delayed today,  Motor: LUE: 4/5 proximal distal LLE: Hip flexion, knee extension 1+/5, ankle dorsiflexion tr-1/5 RUE : 3-4/5  Right upper extremity tremor diminished--stable    Assessment/Plan: 1. Functional deficits which require 3+ hours per day of interdisciplinary therapy in a comprehensive inpatient rehab setting.  Physiatrist is providing close team supervision and 24 hour management of active medical problems listed below.  Physiatrist and rehab team continue to assess barriers to discharge/monitor patient progress toward functional and medical goals  Care Tool:  Bathing    Body parts bathed by patient: Right arm,Left  arm,Chest,Abdomen,Front perineal area,Right upper leg,Left upper leg,Right lower leg,Left lower leg,Face   Body parts bathed by helper: Buttocks     Bathing assist Assist Level: Minimal Assistance - Patient > 75%     Upper Body Dressing/Undressing Upper body dressing   What is the patient wearing?: Pull over shirt    Upper body assist Assist Level: Supervision/Verbal cueing    Lower Body Dressing/Undressing Lower body dressing      What is the patient wearing?: Pants     Lower body assist Assist for lower body dressing: Maximal Assistance - Patient 25 - 49%     Toileting Toileting    Toileting assist Assist for toileting: 2 Helpers     Transfers Chair/bed transfer  Transfers assist  Chair/bed transfer activity did not occur: Safety/medical concerns  Chair/bed transfer assist level: Dependent - mechanical lift Chair/bed transfer assistive device: Other (Stedy)   Locomotion Ambulation   Ambulation assist   Ambulation activity did not occur: Safety/medical concerns  Assist level: Dependent - Patient 0% Assistive device: Lite Gait Max distance: 40 ft   Walk 10 feet activity   Assist  Walk 10 feet activity did not occur: Safety/medical concerns  Assist level: Dependent - Patient 0% Assistive device: Lite Gait   Walk  50 feet activity   Assist Walk 50 feet with 2 turns activity did not occur: Safety/medical concerns         Walk 150 feet activity   Assist Walk 150 feet activity did not occur: Safety/medical concerns         Walk 10 feet on uneven surface  activity   Assist Walk 10 feet on uneven surfaces activity did not occur: Safety/medical concerns         Wheelchair     Assist Will patient use wheelchair at discharge?: Yes Type of Wheelchair: Power    Wheelchair assist level: Supervision/Verbal cueing Max wheelchair distance: 40'    Wheelchair 50 feet with 2 turns activity    Assist    Wheelchair 50 feet with 2  turns activity did not occur: Safety/medical concerns   Assist Level: Supervision/Verbal cueing   Wheelchair 150 feet activity     Assist  Wheelchair 150 feet activity did not occur: Safety/medical concerns   Assist Level: Supervision/Verbal cueing   Blood pressure (!) 168/97, pulse 77, temperature 98 F (36.7 C), temperature source Oral, resp. rate 20, height 6' (1.829 m), weight 124.5 kg, SpO2 97 %.    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 He is status post tumor resection 04/29/2020.   -Continue CIR therapies including PT, OT, and SLP   ELOS 5/31  -f/u HCT shows improvement, no residual tumor    -propranolol for RUE tremor seems to have helped 2. Left posterior tibial vein DVT:  Vascular ultrasound positive for age indeterminate left posterior tibial vein.   -continue xarelto. -antiplatelet therapy: N/A 3. Post-operative pain:tylenol prn  -voltaren gel for bilateral shoulder pain  -continue baclofen 10mg  qhs for cramps and to assist with sleep  Appears to be controlled on 5/24 4. Mood/sleep:  improved  -improved with scheduled trazodone 50mg  and melatonin ---continue  - rx pain, on baclofen too -antipsychotic agents: N/A  -ritalin for activation and initiation--wife worried it's making him more ?nervous, restless. decreased to 5mg   5/24---with less restlessness?    -limiting antispasmodics d/t neurosedation    5. Neuropsych: This patientiscapable of making decisions on hisown behalf. 6. Skin/Wound Care:rash resolved 7. Fluids/Electrolytes/Nutrition:eating well     -Potassium level borderline---supplemented    5/27 3.4---resume supplementation 5/28 8. Hypertension.    -Restarted home Flomax HS which will also help with BP   -no norvasc d/t  swelling   5/20 lasix increased to 40mg  daily for elevated bp's   5/26 changed metoprolol to propranolol d/t tremor   5/29 until this morning bp's improved with propranolol at 20mg  tid--hold on further change for now Vitals:   06/14/20 1925 06/15/20 0343  BP: (!) 141/74 (!) 168/97  Pulse: 60 77  Resp: 20 20  Temp: 98.3 F (36.8 C) 98 F (36.7 C)  SpO2: 97% 97%   9. Hyperlipidemia. Lipitor 10. BPH: increased Flomax to 0.8mg  HS  11. Urinary frequency: mostly incontinent  -UA negative.  timed voiding q2H while awake with urinal- placed nursing order   -continue Flomax to 0.8mg  given continued symptoms and HTN  -5/29 low grade temp---check ua,ucx 12. Leukocytosis. Resolved off decadron 13. Constipation:  -continue senna-s bid          LOS: 44 days A FACE TO [place] 06/15/2020, 9:10 AM

## 2020-06-15 NOTE — Discharge Summary (Signed)
Physician Discharge Summary  Patient ID: HARLAN ERVINE MRN: [id number] DOB/AGE: Jan 28, 1950 70 y.o.  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 COMPARISON:  MRI 05/01/2020.  CT 04/29/2020. FINDINGS: Brain: No abnormality affects the brainstem or cerebellum. Old left frontal encephalomalacia and lesser encephalomalacia at the left temporal tip, likely relating to old head trauma. Previous vertex craniotomy and cranioplasty related to resection of meningioma. Encephalomalacia at the frontoparietal vertices, more on the left than the right. Resolution of previously seen postoperative edema. No subdural collection. No visible residual or recurrent tumor. No sign of acute ischemic infarction. No hydrocephalus. Vascular: No acute vascular finding. Previous resection of the superior sagittal sinus. Skull: Vertex craniotomy and cranioplasty as noted above. No complicating feature. Sinuses/Orbits: Clear/normal Other: None IMPRESSION: No acute or worrisome finding. Previous vertex craniotomy and cranioplasty for resection of meningioma. No evidence of residual or recurrent tumor. No complicating feature at the region of surgery. Vertex encephalomalacia, left more than right. Chronic left inferior frontal encephalomalacia and mild left temporal  tip encephalomalacia probably relating to old head trauma. Electronically Signed   By: [name] M.D.   On: 06/10/2020 17:12    Labs:  Basic Metabolic Panel: Recent Labs  Lab 06/09/20 1155 06/13/20 0458  NA 139 139  K 3.7 3.4*  CL 103 105  CO2 28 30  GLUCOSE 105* 147*  BUN 8 5*  CREATININE 1.04 0.98  CALCIUM 9.1 8.8*    CBC: Recent Labs  Lab 06/09/20 1155 06/13/20 0458  WBC 8.6 6.8  HGB 13.5 12.3*  HCT 41.5 38.7*  MCV 92.8 94.6  PLT 250 205    CBG: No results for input(s): GLUCAP in the last 168 hours.  Family history.  Mother with breast cancer Father with aortic aneurysm.  Denies any colon cancer esophageal cancer or rectal cancer  Brief HPI:   Anthony Downs is a 70 y.o. right-handed male with history of hypertension, sleep apnea not on CPAP hyperlipidemia subdural hematoma post traumatic fall from a truck striking his head on concrete 2008.  Per chart review lives with spouse 1 level home 2 steps to entry.  Independent prior to admission.  Presented 04/28/2020 with progressive right lower extremity greater than right upper extremity weakness over the past few months.  He had a baseline slowly progressive tremor in the right upper extremity.  Denied any seizure.  MRI imaging revealed convexity meningioma left greater than right with surrounding vasogenic edema in the left posterior frontal lobe.  Patient underwent left craniectomy with tumor excision 04/29/2020 per Dr. Venetia Constable.  Maintained on Decadron protocol.  He was cleared to begin subcutaneous heparin for DVT prophylaxis 05/01/2020.  Tolerating a regular diet.  Therapy evaluations completed due to patient's progressive weakness recommendations physical medicine rehab consult patient was admitted for a comprehensive rehab program.   Hospital Course: JAMARIS THEARD was admitted to rehab 05/02/2020 for inpatient therapies to consist of  PT, ST and OT at least three hours five days a week. Past admission  physiatrist, therapy team and rehab RN have worked together to provide customized collaborative inpatient rehab.  Pertaining to patient's bilateral frontal grade 1 meningioma status post tumor resection 04/29/2020.  Patient would follow-up neurosurgery.  Surgical site well-healed.  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  He was attending full therapies.  Hospital course findings of left posterior tibial vein DVT he was cleared to begin Xarelto no bleeding episodes.  Pain management with the use of Voltaren gel bilateral shoulders as well as baclofen for cramps and to help assist with sleep.  Sleep deprivation schedule trazodone melatonin.  Ritalin had been initiated for patient better activation for ability to attend to task and focus.  Blood pressure controlled and monitored on low-dose Lasix as well as low-dose Inderal which was being used for blood pressure as well as tremor.  History of BPH continued on Flomax with no dysuria or hematuria.  Nursing was instructed on timed voiding every 2 hours while awake.  Flomax was also helping with patient's blood pressure control.  Lipitor ongoing for hyperlipidemia.   Blood pressures were monitored on TID basis and soft and monitored     Rehab course: During patient's stay in rehab weekly team conferences were held to monitor patient's progress, set goals and discuss barriers to discharge. At admission, patient required +2 physical assist for rolling total assist supine to sit sit to supine.  Max assist eating modified independent grooming total assist upper lower body bathing total assist lower body dressing  Physical exam.  Blood pressure 131/64 pulse 60 temperature 97.4 respirations 13 oxygen saturation 94% room air Constitutional.  No acute distress HEENT.  Craniotomy site clean and dry Eyes.  Pupils round and reactive to light no discharge without nystagmus Neck.  Supple nontender no JVD without  thyromegaly Cardiac regular rate and rhythm without extra sounds or murmur heard Abdomen.  Soft nontender positive bowel sounds without rebound Respiratory effort normal no respiratory distress without wheeze Skin.  Warm and dry Extremities.  No clubbing cyanosis or edema Neurologic.  Cranial nerves II through XII intact motor strength 5/5 in the left and 0/5 right deltoid bicep tricep grip trace bilateral hip flexors, knee extensors, ankle dorsi and plantar flexion Sensory exam normal sensation to light touch and proprioception in bilateral upper and lower extremities   He/She  has had improvement in activity tolerance, balance, postural control as well as ability to compensate for deficits. He/She has had improvement in functional use RUE/LUE  and RLE/LLE as well as improvement in awareness.  Patient attending therapies ongoing family teaching.  Wife able to assist patient safely with good mechanics use of hospital bed features as well as power wheelchair features for assisting patient with supine to side to sit, sit to stand using steady, bed to wheelchair using steady with no assistance or cueing from therapist.  Wife assisted with placement and hygiene.  Patient was able to maneuver his wheelchair to the therapy gym.  In the gym sit to stand activities with steady min guard assist.  Sit to stand supervision to stand from perched position from dynamic standing.  Full family teaching was completed plan discharge to home       Disposition: Discharged to home    Diet: Regular  Special Instructions: No driving smoking or alcohol  Medications at discharge 1.  Tylenol as needed 2.  Lipitor 10 mg p.o. nightly 3.  Baclofen 10 mg p.o. nightly 4.  Dulcolax suppository daily as needed moderate constipation 5.  Voltaren gel 2 g 4 times daily to affected area 6.  Lasix 20 mg p.o. daily 7.  Hydroxyzine 25 mg 3 times daily as needed anxiety 8.  Claritin 10 mg daily as needed allergies 9.   Ritalin 5 mg p.o. twice daily 10.  Protonix 40 mg p.o. daily 11.  MiraLAX twice daily as needed constipation 12.  Inderal 20 mg p.o. 3 times daily 13.  Xarelto 20 mg p.o. daily 14.  Senokot S2 tabs p.o. twice daily old for loose stools 15.  Flomax 0.8 mg p.o. daily after supper 16.  Trazodone 50 mg p.o. nightly  30-35 minutes were spent completing discharge summary and discharge planning  Discharge Instructions    Ambulatory referral to Physical Medicine Rehab   Complete by: As directed    Medical complexity follow-up 1 to 2 weeks bilateral frontal meningioma       Follow-up Information    [name], MD Follow up.   Specialty: Physical Medicine and Rehabilitation Why: Office to call for appointment Contact information: [address] [place] [address] [phone]        Judith Part, MD Follow up.   Specialty: Neurosurgery Why: Call for appointment Contact information: [name] [address] [phone]               Signed: Cathlyn Parsons 06/16/2020, 7:08 AM

## 2020-06-16 LAB — BASIC METABOLIC PANEL
Anion gap: 5 (ref 5–15)
BUN: 7 mg/dL — ABNORMAL LOW (ref 8–23)
CO2: 28 mmol/L (ref 22–32)
Calcium: 9 mg/dL (ref 8.9–10.3)
Chloride: 104 mmol/L (ref 98–111)
Creatinine, Ser: 1 mg/dL (ref 0.61–1.24)
GFR, Estimated: >60 mL/min (ref >60)
Glucose, Bld: 119 mg/dL — ABNORMAL HIGH (ref 70–99)
Potassium: 3.7 mmol/L (ref 3.5–5.1)
Sodium: 137 mmol/L (ref 135–145)

## 2020-06-16 LAB — CBC
HCT: 40.9 % (ref 39.0–52.0)
Hemoglobin: 13.2 g/dL (ref 13.0–17.0)
MCH: 30 pg (ref 26.0–34.0)
MCHC: 32.3 g/dL (ref 30.0–36.0)
MCV: 93 fL (ref 80.0–100.0)
Platelets: 239 10*3/uL (ref 150–400)
RBC: 4.4 MIL/uL (ref 4.22–5.81)
RDW: 12.8 % (ref 11.5–15.5)
WBC: 8.5 10*3/uL (ref 4.0–10.5)
nRBC: 0 % (ref 0.0–0.2)

## 2020-06-16 MED ORDER — AMOXICILLIN-POT CLAVULANATE 500-125 MG PO TABS
1.0000 | ORAL_TABLET | Freq: Three times a day (TID) | ORAL | Status: AC
  Administered 2020-06-16 (×2): 500 mg via ORAL
  Filled 2020-06-16 (×2): qty 1

## 2020-06-16 MED ORDER — AMOXICILLIN-POT CLAVULANATE 500-125 MG PO TABS
1.0000 | ORAL_TABLET | Freq: Two times a day (BID) | ORAL | Status: DC
  Administered 2020-06-17: 500 mg via ORAL
  Filled 2020-06-16: qty 1

## 2020-06-16 NOTE — Progress Notes (Signed)
Occupational Therapy Discharge Summary  Patient Details  Name: Anthony Downs MRN: [id number] Date of Birth: March 13, 1950  Today's Date: 06/16/2020 OT Individual Time: 9371-6967 OT Individual Time Calculation (min): 60 min    Patient has met 9 of 9 long term goals due to improved activity tolerance, improved balance, postural control, ability to compensate for deficits, functional use of  RIGHT upper extremity, improved attention, improved awareness and improved coordination.  Patient to discharge at [facility] Assist level using a stedy for transfers.  Patient's care partner is independent to provide the necessary physical and cognitive assistance at discharge.    Reasons goals not met: All treatment goals met.   Recommendation:  Patient will benefit from ongoing skilled OT services in home health setting to continue to advance functional skills in the area of BADL and Reduce care partner burden.  Equipment: BSC  Reasons for discharge: treatment goals met and discharge from hospital  Patient/family agrees with progress made and goals achieved: Yes   Skilled OT Intervention: Pt received supine with his wife reporting he is still not feeling his best. Pt was agreeable to take shower. Discussed d/c planning and caregiver instruction throughout session. Pt completed bed mobility with mod A to EOB. He stood with supervision in the stedy with min facilitation to place his RUE on the bar. Pt was transferred into the shower via stedy to bariatric BSC. Pt completed bathing seated on BSC with set up assist for UB (no cueing required for RUE use or for positioning!) and min A for thoroughness with washing buttocks. Pt completed sit > stand again with supervision in stedy. He completed oral care perched in stedy at the sink with set up assist. Pt donned shirt with min A d/t being disoriented with sleeve vs head hole. Pt sat EOB and donned shorts with max A. He stood with RW with max +2 assist. He  returned to supine in bed and was left supine with all needs met. Bed alarm set. Reviewed PROM with pt's wife for his RUE.   Sa[place]o Stim One placed on pt's tricep to encourage voluntary motor activation and for tone reduction for 60 min unattended e-stim. No adverse skin reaction or pain reported.  330 pulse width 35 Hz pulse rate On 8 sec/ off 8 sec Ramp up/ down 2 sec Symmetrical Biphasic wave form  Max intensity 167m at 500 Ohm load   OT Discharge Precautions/Restrictions  Precautions Precautions: Fall Precaution Comments: BLE extensor tone R>L, R hand tremor (increased from baseline) Restrictions Weight Bearing Restrictions: No Pain Pain Assessment Pain Scale: 0-10 Pain Score: 0-No pain Pain Type: Acute pain Pain Location: Shoulder Pain Orientation: Right Pain Descriptors / Indicators: Aching Pain Frequency: Intermittent Pain Onset: On-going Pain Intervention(s): Medication (See eMAR) ADL ADL Eating: Supervision/safety Where Assessed-Eating: Bed level Grooming: Supervision/safety Where Assessed-Grooming: Edge of bed Upper Body Bathing: Supervision/safety Where Assessed-Upper Body Bathing: Shower Lower Body Bathing: Minimal assistance Where Assessed-Lower Body Bathing: Shower Upper Body Dressing: Minimal assistance Where Assessed-Upper Body Dressing: Sitting at sink Lower Body Dressing: Maximal assistance Where Assessed-Lower Body Dressing: Other (Comment) (stedy) Toileting: Maximal assistance Where Assessed-Toileting: TGeneticist, molecularguard (CGA in stedy) Toilet Transfer Method: Other (comment) (stedy) Walk-In Shower Transfer: Contact guard (stedy) WSocial research officer, governmentMethod: Other (comment) (stedy) Walk-In Shower Equipment: Other (comment) (BSC) Vision Baseline Vision/History: Wears glasses Wears Glasses: Reading only Patient Visual Report: No change from baseline Vision Assessment?: No apparent visual deficits Perception  Perception:  Impaired Spatial Orientation: impaired midline  orientation, R inattention Praxis Praxis: Impaired Praxis Impairment Details: Motor planning;Initiation Cognition Overall Cognitive Status: Impaired/Different from baseline Arousal/Alertness:  (intermittent drowsiness) Orientation Level: Oriented X4 Attention: Sustained Focused Attention: Appears intact Sustained Attention: Appears intact Memory: Impaired Memory Impairment: Decreased short term memory;Decreased recall of new information Decreased Short Term Memory: Verbal basic;Functional basic Awareness: Appears intact Awareness Impairment: Emergent impairment Problem Solving: Appears intact Safety/Judgment: Appears intact Comments: Patient's cognitive functioning impacted by fatigue Sensation Sensation Light Touch: Appears Intact Central sensation comments: requires increased time and repetition to respond due to communication deficits Light Touch Impaired Details: Impaired LLE;Impaired RLE Proprioception: Impaired Detail Proprioception Impaired Details: Impaired RLE;Impaired LLE Coordination Gross Motor Movements are Fluid and Coordinated: No Fine Motor Movements are Fluid and Coordinated: No Coordination and Movement Description: Active movement through full range in L LE, trace movement and functional activation in R LE Motor  Motor Motor: Abnormal tone;Clonus;Abnormal postural alignment and control;Motor apraxia;Tetraplegia Motor - Discharge Observations: Extensor tone RLE>LLE (much improved since admission), hx of R UE tremor (increased from baseline), tetraplegia R>L Mobility  Bed Mobility Bed Mobility: Rolling Right;Rolling Left;Supine to Sit;Sit to Supine Rolling Right: Minimal Assistance - Patient > 75% Rolling Left: Minimal Assistance - Patient > 75% Supine to Sit: Minimal Assistance - Patient > 75% Sit to Supine: Moderate Assistance - Patient 50-74% Transfers Sit to Stand: Moderate Assistance - Patient  50-74% Stand to Sit: Moderate Assistance - Patient 50-74%  Trunk/Postural Assessment  Cervical Assessment Cervical Assessment: Exceptions to [facility] (cervical protraction) Thoracic Assessment Thoracic Assessment: Exceptions to [facility] (rounded shoulders) Lumbar Assessment Lumbar Assessment: Exceptions to [facility] (posterior pelvic tilt) Postural Control Postural Control: Deficits on evaluation Trunk Control: impaired - requires supervision for static and dynamic sitting balance Righting Reactions: reduced/delayed, improved since admission Protective Responses: reduced/delayed, improved since admission  Balance Balance Balance Assessed: Yes Static Sitting Balance Static Sitting - Balance Support: Feet supported;No upper extremity supported Static Sitting - Level of Assistance: 5: Stand by assistance Dynamic Sitting Balance Dynamic Sitting - Balance Support: Right upper extremity supported;Left upper extremity supported Dynamic Sitting - Level of Assistance: 5: Stand by assistance Static Standing Balance Static Standing - Balance Support: Bilateral upper extremity supported;During functional activity Static Standing - Level of Assistance: 4: Min assist Extremity/Trunk Assessment RUE Assessment RUE Assessment: Exceptions to [facility] Strength Comments: Pt able to actively bring shoulder to 90 degrees of flexion and flex elbow to full ROM. Unable to fully extend arm and limited by tone and tremor RUE Body System: Neuro Brunstrum levels for arm and hand: Arm;Hand Brunstrum level for arm: Stage IV Movement is deviating from synergy Brunstrum level for hand: Stage VI Isolated joint movements LUE Assessment LUE Assessment: Within Functional Limits   Curtis Sites 06/16/2020, 12:59 PM

## 2020-06-16 NOTE — Progress Notes (Signed)
Occupational Therapy Session Note  Patient Details  Name: OZ GAMMEL MRN: [id number] Date of Birth: 1951/01/09  Today's Date: 06/16/2020 OT Individual Time: 1445-1455 OT Individual Time Calculation (min): 10 min  and Today's Date: 06/16/2020 OT Missed Time: 35 Minutes Missed Time Reason: Patient fatigue   Short Term Goals: Week 4:  OT Short Term Goal 1 (Week 4): Pt will manage trunk Sup>sit wiht MOD A consistently OT Short Term Goal 1 - Progress (Week 4): Met OT Short Term Goal 2 (Week 4): Pt will tolerate trialing toileting OOB in sara + daily OT Short Term Goal 2 - Progress (Week 4): Met OT Short Term Goal 3 (Week 4): Pt will don shirt with no more than MIN A for sitting balnace on EOB OT Short Term Goal 3 - Progress (Week 4): Met OT Short Term Goal 4 (Week 4): pt will thread 1LE into pants OT Short Term Goal 4 - Progress (Week 4): Progressing toward goal Week 5:  OT Short Term Goal 1 (Week 5): Pt will don shirt with MIN A overall OT Short Term Goal 1 - Progress (Week 5): Met OT Short Term Goal 2 (Week 5): Pt will roll in B directions with MOD A OT Short Term Goal 2 - Progress (Week 5): Met OT Short Term Goal 3 (Week 5): Pt will groom at sink with no VC for sequencing to demo improve processing speed OT Short Term Goal 3 - Progress (Week 5): Met OT Short Term Goal 4 (Week 5): Pt will thread 1LE at EOB with A for sitting balance OT Short Term Goal 4 - Progress (Week 5): Progressing toward goal Week 6:  OT Short Term Goal 1 (Week 6): STG= LTG d/t ELOS  Skilled Therapeutic Interventions/Progress Updates:    Pt greeted at time of session supine in bed resting with wife present who remained throughout. Attempted to engage pt in OT session, pt and wife stating he is very fatigued from full day, pt repeatedly declining any OOB, EOB, or supine activity wanting to rest. Discussion with pt and wife transport home for tomorrow, as well as ensuring pt/wife feed prepared for stedy  transfers, DME needs, etc. Pt missed 35 mins of OT d/t fatigue.  Therapy Documentation Precautions:  Precautions Precautions: Fall,Other (comment) Precaution Comments: Strong BLE extensor tone, hx of R hand tremor Restrictions Weight Bearing Restrictions: No    Therapy/Group: Individual Therapy  Viona Gilmore 06/16/2020, 7:28 AM

## 2020-06-16 NOTE — Progress Notes (Signed)
Patient ID: BRYLEE MCGREAL, male   DOB: [birth year], 70 y.o.   MRN: [id number]  SW confirmed with [facility] [phone]) pt is scheduled for 9am pick up tomorrow.   SW received phone call from pt wife [name] requesting [place] be cancelled since it is too small for pt. She ordered a similar item off [facility]. Reports this items was sent back with the tech that delivered item to room. SW informed pt wife on above.   SW put in request to [place] to cancel [place].   Loralee Pacas, MSW, [place] Office: [phone] Cell: [phone] Fax: [phone]

## 2020-06-16 NOTE — Progress Notes (Signed)
Physical Therapy Discharge Summary  Patient Details  Name: Anthony Downs MRN: [id number] Date of Birth: [birth year]  Today's Date: 06/16/2020 PT Individual Time: 0800-0830 and [id number] PT Individual Time Calculation (min): 30 min and 70 min   Patient has met 7 of 8 long term goals due to improved activity tolerance, improved balance, improved postural control, increased strength, increased range of motion, decreased pain, ability to compensate for deficits, improved attention, improved awareness and improved coordination.  Patient to discharge at a wheelchair level [place].   Patient's care partner is independent to provide the necessary physical and cognitive assistance at discharge.  Reasons goals not met: Patient progressed to therapeutic ambulation up to 40 ft in the light gait, goal set to 50 ft, however, patient is not a functional ambulator and will not be ambulating at d/c. Goal is adequate for d/c.  Recommendation:  Patient will benefit from ongoing skilled PT services in home health setting to continue to advance safe functional mobility, address ongoing impairments in balance, strength, motor planning, activity tolerance, functional mobility, motor control, attention, awareness, and minimize fall risk.  Equipment: Power wheelchair provided by [employer], electric hospital bed, Denna Haggard for transfers (purchased by patient's wife)  Reasons for discharge: treatment goals met  Patient/family agrees with progress made and goals achieved: Yes  Skilled Therapeutic Intervention: Session 1: Patient in bed with his wife in the room upon PT arrival. Patient alert and agreeable to PT session. Patient reported mild R shoulder pain during session, RN made aware. PT provided repositioning, rest breaks, and distraction as pain interventions throughout session.   Focused session on d/c assessment, see details above.   Therapeutic Activity: Bed Mobility: Patient performed rolling  L with supervision with use of bed rail and rolling R with mod-min A without use of bed rail. Patient performed with min cues for turning his head and bringing his R shoulder over to roll to the R. He performed supine to/from sit with min-mod A for trunk and lower extremity management. Provided verbal cues for bringing knees to chest and brining R shoulder forward to promote pushing up with L hand. Patient sat EOB >5 min with distant supervision for safety. Discussed patient's progress since admission and educated on awareness of deficits. Patient and his wife very excited about going home and very pleased with his progress during his time in CIR.  Patient in bed with his wife at bedside at end of session with breaks locked and all needs within reach.   Session 2: Patient in bed with his wife at bedside upon PT arrival. Patient alert and agreeable to PT session. Patient reported mild R shoulder pain during session, RN made aware. PT provided repositioning, rest breaks, and distraction as pain interventions throughout session.   Applied Kinesiotape over R shoulder using structural support technique with the aim of reducing inferior subluxation at [facility] joint. Prior to application, patient denied any history of skin irritation or allergy to adhesive. Educated patient and his wife on purpose of kinesiotape placement and signs symptoms of allergic reaction or irritation. Cleaned patient's skin and applied test strip at beginning of session and removed at end of session without sings of skin irritation. Instructed patient that the tape can be left on up to 3 days and can be worn in the shower. Instructed to removed the tape if peeling off, signs of skin irritation arise, or it has been on for >3 days. Informed patient that the tape is best removed in the  shower or with a wet wash cloth. Patient and his wife stated understanding.   Focused remainder of session on hands on training and d/c education on home safety,  fall risk/prevention, home modifications to prevent falls, and activation of emergency services in the event of a fall, community access and resources for w/c accessible vans, maintenance of power w/c through [employer], recruiting support from friends, family, and community for respite care and management of home care.   All mobility performed with assist from patient's wife without cues from therapist. Performed bed mobility as above, transfers bed<>power w/c using Stedy with CGA-min A. Patient's wife demonstrated good body mechanics and safety awareness with use of all equipment, power chair off during both transfers, without cues throughout. Cleared patient's wife for transfers bed<>power w/c in the room. Encouraged a trial to transfer to power w/c this evening for dinner in preparation for d/c. RN made aware and safety plan updated.   Patient in bed with his wife at bedside at end of session with breaks locked, bed alarm set, and all needs within reach.    PT Discharge Precautions/Restrictions Precautions Precautions: Fall Precaution Comments: BLE extensor tone R>L, R hand tremor (increased from baseline) Restrictions Weight Bearing Restrictions: No Vision/Perception  Perception Perception: Impaired Spatial Orientation: impaired midline orientation, R inattention Praxis Praxis: Impaired Praxis Impairment Details: Motor planning;Initiation  Cognition Overall Cognitive Status: Impaired/Different from baseline Arousal/Alertness:  (intermittent drowsyness) Orientation Level: Oriented X4 Attention: Focused Focused Attention: Appears intact Sustained Attention: Appears intact Memory: Impaired Memory Impairment: Decreased short term memory;Decreased recall of new information Decreased Short Term Memory: Verbal basic;Functional basic Awareness: Appears intact Problem Solving: Appears intact Safety/Judgment: Appears intact Sensation Sensation Light Touch: Appears Intact Central  sensation comments: requires increased time and repetition to respond due to communication deficits Proprioception: Impaired Detail Proprioception Impaired Details: Impaired RLE;Impaired LLE Stereognosis: Not tested Coordination Gross Motor Movements are Fluid and Coordinated: No Fine Motor Movements are Fluid and Coordinated: No Coordination and Movement Description: Active movement through full range in L LE, trace movement and functional activation in R LE Motor  Motor Motor: Abnormal tone;Clonus;Abnormal postural alignment and control;Motor apraxia;Tetraplegia Motor - Discharge Observations: Extensor tone RLE>LLE (much improved since admission), hx of R UE tremor (increased from baseline), tetraplegia R>L  Mobility Bed Mobility Bed Mobility: Rolling Right;Rolling Left;Supine to Sit;Sit to Supine (use of hospital bed features to achieve highest level of function with bed mobility to reduce caregiver burden) Rolling Right: Minimal Assistance - Patient > 75% Rolling Left: Minimal Assistance - Patient > 75% Supine to Sit: Minimal Assistance - Patient > 75% Sit to Supine: Moderate Assistance - Patient 50-74% Transfers Transfers: Sit to Stand;Stand to Sit;Transfer via [place] to Stand: Moderate Assistance - Patient 50-74% Stand to Sit: Moderate Assistance - Patient 50-74% Transfer (Assistive device): Rolling walker Transfer via Lift Equipment: Stedy (min A-CGA with use of Stedy) Locomotion  Gait Ambulation: Yes Gait Assistance: Dependent - mechanical lift (Lite Gait) Gait Distance (Feet): 40 Feet Gait Gait: Yes Gait Pattern: Impaired (Required max-total A for B limb advancement with +3 assist for patient and equipment mangement) Stairs / Additional Locomotion Stairs: No Wheelchair Mobility Wheelchair Mobility: No  Trunk/Postural Assessment  Cervical Assessment Cervical Assessment: Exceptions to [facility] (cervical protraction) Thoracic Assessment Thoracic Assessment:  Exceptions to [facility] (rounded shoulders) Lumbar Assessment Lumbar Assessment: Exceptions to [facility] (posterior pelvic tilt in sitting) Postural Control Postural Control: Deficits on evaluation Trunk Control: impaired - requires supervision for static and dynamic sitting balance Righting  Reactions: reduced/delayed, improved since admission Protective Responses: reduced/delayed, improved since admission  Balance Balance Balance Assessed: Yes Static Sitting Balance Static Sitting - Balance Support: Feet supported;No upper extremity supported Static Sitting - Level of Assistance: 5: Stand by assistance Dynamic Sitting Balance Dynamic Sitting - Balance Support: Right upper extremity supported;Left upper extremity supported Dynamic Sitting - Level of Assistance: 5: Stand by assistance Dynamic Sitting - Balance Activities: Lateral lean/weight shifting;Forward lean/weight shifting;Reaching for objects;Reaching for weighted objects;Reaching across midline Static Standing Balance Static Standing - Balance Support: Bilateral upper extremity supported;During functional activity Static Standing - Level of Assistance: 4: Min assist Extremity Assessment  RLE Assessment RLE Assessment: Exceptions to [facility] Passive Range of Motion (PROM) Comments: DF limited to neutral, maintained with DF adjustable night splints throughout patient's stay General Strength Comments: trace to no active movement at rest, able to activate gluteals and quads in standing with functional mobility RLE Tone RLE Tone: Hypertonic;Modified Ashworth Body Part - Modified Ashworth Scale: Soleus;Gastrocnemius;Quadriceps;Hamstrings Hamstrings - Modified Ashworth Scale for Grading Hypertonia RLE: More marked increase in muscle tone through most of the ROM, but affected part(s) easily moved QUADRICEPS - Modified Ashworth Scale for Grading Hypertonia RLE: More marked increase in muscle tone through most of the ROM, but affected part(s) easily  moved GASTROCNEMIUS - Modified Ashworth Scale for Grading Hypertonia RLE: More marked increase in muscle tone through most of the ROM, but affected part(s) easily moved SOLEUS - Modified Ashworth Scale for Grading Hypertonia RLE: More marked increase in muscle tone through most of the ROM, but affected part(s) easily moved LLE Assessment LLE Assessment: Exceptions to [facility] Passive Range of Motion (PROM) Comments: DF ROM 0-5 degrees with over pressure Active Range of Motion (AROM) Comments: 75% hip and knee ROM General Strength Comments: Grossly 2 to 3+/5, greater proximal to distal LLE Tone LLE Tone: Mild;Moderate;Hypotonic;Modified Ashworth Body Part - Modified Ashworth Scale: Hamstrings;Quadriceps;Gastrocnemius;Soleus Hamstrings - Modified Ashworth Scale for Grading Hypertonia LLE: Slight increase in muscle tone, manifested by a catch and release or by minimal resistance at the end of the range of motion when the affected part(s) is moved in flexion or extension Quadriceps - Modified Ashworth Scale for Grading Hypertonia LLE: Slight increase in muscle tone, manifested by a catch and release or by minimal resistance at the end of the range of motion when the affected part(s) is moved in flexion or extension Gastrocnemius - Modified Ashworth Scale for Grading Hypertonia LLE: Slight increase in muscle tone, manifested by a catch, followed by minimal resistance throughout the remainder (less than half) of the ROM Soleus - Modified Ashworth Scale for Grading Hypertonia LLE: Slight increase in muscle tone, manifested by a catch, followed by minimal resistance throughout the remainder (less than half) of the ROM    Ryosuke Ericksen L Ember Henrikson PT, DPT  06/16/2020, 12:51 PM

## 2020-06-16 NOTE — Progress Notes (Signed)
PROGRESS NOTE   Subjective/Complaints:  No issues overnite, no further temp elevation , discussed labwork with pt and  wife,   ROS: Limited due to cognitive/behavioral    Objective:   No results found. Recent Labs    06/16/20 1059  WBC 8.5  HGB 13.2  HCT 40.9  PLT 239   Recent Labs    06/16/20 1059  NA 137  K 3.7  CL 104  CO2 28  GLUCOSE 119*  BUN 7*  CREATININE 1.00  CALCIUM 9.0    Intake/Output Summary (Last 24 hours) at 06/16/2020 1334 Last data filed at 06/16/2020 0720 Gross per 24 hour  Intake 420 ml  Output 150 ml  Net 270 ml        Physical Exam: Vital Signs Blood pressure 127/77, pulse 64, temperature 97.9 F (36.6 C), resp. rate 16, height 6' (1.829 m), weight 124.5 kg, SpO2 97 %.  General: No acute distress Mood and affect are appropriate Heart: Regular rate and rhythm no rubs murmurs or extra sounds Lungs: Clear to auscultation, breathing unlabored, no rales or wheezes Abdomen: Positive bowel sounds, soft nontender to palpation, nondistended Extremities: No clubbing, cyanosis, or edema Skin: No evidence of breakdown, no evidence of rash   Skin: scalp wound healed .  Musc: No edema in extremities.  No tenderness in extremities. Neuro: fairly Alert, more delayed today,  Motor: LUE: 4/5 proximal distal LLE: Hip flexion, knee extension 1+/5, ankle dorsiflexion tr-1/5 RUE : 3-4/5  Right upper extremity tremor diminished--stable    Assessment/Plan: 1. Functional deficits which require 3+ hours per day of interdisciplinary therapy in a comprehensive inpatient rehab setting.  Physiatrist is providing close team supervision and 24 hour management of active medical problems listed below.  Physiatrist and rehab team continue to assess barriers to discharge/monitor patient progress toward functional and medical goals  Care Tool:  Bathing    Body parts bathed by patient: Right arm,Left  arm,Chest,Abdomen,Front perineal area,Right upper leg,Left upper leg,Right lower leg,Left lower leg,Face   Body parts bathed by helper: Buttocks     Bathing assist Assist Level: Minimal Assistance - Patient > 75%     Upper Body Dressing/Undressing Upper body dressing   What is the patient wearing?: Pull over shirt    Upper body assist Assist Level: Minimal Assistance - Patient > 75%    Lower Body Dressing/Undressing Lower body dressing      What is the patient wearing?: Pants     Lower body assist Assist for lower body dressing: Maximal Assistance - Patient 25 - 49%     Toileting Toileting    Toileting assist Assist for toileting: Maximal Assistance - Patient 25 - 49%     Transfers Chair/bed transfer  Transfers assist  Chair/bed transfer activity did not occur: Safety/medical concerns  Chair/bed transfer assist level: Dependent - mechanical lift Chair/bed transfer assistive device: Other (stedy)   Locomotion Ambulation   Ambulation assist   Ambulation activity did not occur: Safety/medical concerns  Assist level: Dependent - Patient 0% Assistive device: Lite Gait Max distance: 40 ft   Walk 10 feet activity   Assist  Walk 10 feet activity did not occur: Safety/medical concerns  Assist level: Dependent - Patient 0% Assistive device: Lite Gait   Walk 50 feet activity   Assist Walk 50 feet with 2 turns activity did not occur: Safety/medical concerns         Walk 150 feet activity   Assist Walk 150 feet activity did not occur: Safety/medical concerns         Walk 10 feet on uneven surface  activity   Assist Walk 10 feet on uneven surfaces activity did not occur: Safety/medical concerns         Wheelchair     Assist Will patient use wheelchair at discharge?: Yes Type of Wheelchair: Power    Wheelchair assist level: Supervision/Verbal cueing Max wheelchair distance: 40'    Wheelchair 50 feet with 2 turns  activity    Assist    Wheelchair 50 feet with 2 turns activity did not occur: Safety/medical concerns   Assist Level: Supervision/Verbal cueing   Wheelchair 150 feet activity     Assist  Wheelchair 150 feet activity did not occur: Safety/medical concerns   Assist Level: Supervision/Verbal cueing   Blood pressure 127/77, pulse 64, temperature 97.9 F (36.6 C), resp. rate 16, height 6' (1.829 m), weight 124.5 kg, SpO2 97 %.    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 He is status post tumor resection 04/29/2020.   -Continue CIR therapies including PT, OT, and SLP   ELOS 5/31  -f/u HCT shows improvement, no residual tumor    -propranolol for RUE tremor seems to have helped 2. Left posterior tibial vein DVT:  Vascular ultrasound positive for age indeterminate left posterior tibial vein.   -continue xarelto. -antiplatelet therapy: N/A 3. Post-operative pain:tylenol prn  -voltaren gel for bilateral shoulder pain  -continue baclofen 10mg  qhs for cramps and to assist with sleep  Appears to be controlled on 5/24 4. Mood/sleep:  improved  -improved with scheduled trazodone 50mg  and melatonin ---continue  - rx pain, on baclofen too -antipsychotic agents: N/A  -ritalin for activation and initiation--wife worried it's making him more ?nervous, restless. decreased to 5mg   5/24---with less restlessness?    -limiting antispasmodics d/t neurosedation    5. Neuropsych: This patientiscapable of making decisions on hisown behalf. 6. Skin/Wound Care:rash resolved 7. Fluids/Electrolytes/Nutrition:eating well     -Potassium level borderline---supplemented    5/27 3.4---resume supplementation 5/28 8. Hypertension.    -Restarted home Flomax HS which will also help with  BP   -no norvasc d/t swelling   5/20 lasix increased to 40mg  daily for elevated bp's   5/26 changed metoprolol to propranolol d/t tremor   5/29 until this morning bp's improved with propranolol at 20mg  tid--hold on further change for now Vitals:   06/16/20 0436 06/16/20 1317  BP: (!) 142/72 127/77  Pulse: 67 64  Resp: 16 16  Temp: 98.4 F (36.9 C) 97.9 F (36.6 C)  SpO2: 92% 97%   9. Hyperlipidemia. Lipitor 10. BPH: increased Flomax to 0.8mg  HS  11. Urinary frequency: mostly incontinent  -UA negative.  timed voiding q2H while awake with urinal- placed nursing order   -continue Flomax to 0.8mg  given continued symptoms and HTN  -5/29 low grade temp---neg ua,ucx pnd 12. Leukocytosis. Resolved off decadron 13. Constipation:  -continue senna-s bid          LOS: 45 days A FACE TO FACE EVALUATION WAS PERFORMED  Luanna Salk Reesa Gotschall 06/16/2020, 1:34 PM

## 2020-06-17 LAB — CBC
HCT: 39 % (ref 39.0–52.0)
Hemoglobin: 12.6 g/dL — ABNORMAL LOW (ref 13.0–17.0)
MCH: 30.2 pg (ref 26.0–34.0)
MCHC: 32.3 g/dL (ref 30.0–36.0)
MCV: 93.5 fL (ref 80.0–100.0)
Platelets: 215 10*3/uL (ref 150–400)
RBC: 4.17 MIL/uL — ABNORMAL LOW (ref 4.22–5.81)
RDW: 13 % (ref 11.5–15.5)
WBC: 7.6 10*3/uL (ref 4.0–10.5)
nRBC: 0 % (ref 0.0–0.2)

## 2020-06-17 LAB — URINE CULTURE: Culture: >=100000 — AB

## 2020-06-17 LAB — BASIC METABOLIC PANEL
Anion gap: 4 — ABNORMAL LOW (ref 5–15)
BUN: <5 mg/dL — ABNORMAL LOW (ref 8–23)
CO2: 29 mmol/L (ref 22–32)
Calcium: 8.9 mg/dL (ref 8.9–10.3)
Chloride: 104 mmol/L (ref 98–111)
Creatinine, Ser: 0.88 mg/dL (ref 0.61–1.24)
GFR, Estimated: >60 mL/min (ref >60)
Glucose, Bld: 126 mg/dL — ABNORMAL HIGH (ref 70–99)
Potassium: 3.6 mmol/L (ref 3.5–5.1)
Sodium: 137 mmol/L (ref 135–145)

## 2020-06-17 MED ORDER — DICLOFENAC SODIUM 1 % EX GEL: 2.0000 g | Freq: Four times a day (QID) | CUTANEOUS | 0 refills | Status: DC

## 2020-06-17 MED ORDER — RIVAROXABAN 20 MG PO TABS: 20.0000 mg | ORAL_TABLET | Freq: Every day | ORAL | 0 refills | Status: DC

## 2020-06-17 MED ORDER — AMOXICILLIN-POT CLAVULANATE 500-125 MG PO TABS: 1.0000 | ORAL_TABLET | Freq: Two times a day (BID) | ORAL | 0 refills | Status: DC

## 2020-06-17 MED ORDER — TAMSULOSIN HCL 0.4 MG PO CAPS: 0.8000 mg | ORAL_CAPSULE | Freq: Every day | ORAL | 0 refills | Status: DC

## 2020-06-17 MED ORDER — ATORVASTATIN CALCIUM 10 MG PO TABS: 10.0000 mg | ORAL_TABLET | Freq: Every day | ORAL | 0 refills | Status: DC

## 2020-06-17 MED ORDER — HYDROCORTISONE 1 % EX CREA: TOPICAL_CREAM | Freq: Three times a day (TID) | CUTANEOUS | 0 refills | Status: DC

## 2020-06-17 MED ORDER — PANTOPRAZOLE SODIUM 40 MG PO TBEC: 40.0000 mg | DELAYED_RELEASE_TABLET | Freq: Every day | ORAL | 0 refills | Status: DC

## 2020-06-17 MED ORDER — FUROSEMIDE 20 MG PO TABS: 20.0000 mg | ORAL_TABLET | Freq: Every day | ORAL | 0 refills | Status: DC

## 2020-06-17 MED ORDER — SENNOSIDES-DOCUSATE SODIUM 8.6-50 MG PO TABS: 2.0000 | ORAL_TABLET | Freq: Two times a day (BID) | ORAL | 0 refills | Status: DC

## 2020-06-17 MED ORDER — BACLOFEN 10 MG PO TABS: 10.0000 mg | ORAL_TABLET | Freq: Every day | ORAL | 0 refills | Status: DC

## 2020-06-17 MED ORDER — HYDROXYZINE HCL 25 MG PO TABS: 25.0000 mg | ORAL_TABLET | Freq: Three times a day (TID) | ORAL | 0 refills | Status: DC | PRN

## 2020-06-17 MED ORDER — POTASSIUM CHLORIDE CRYS ER 20 MEQ PO TBCR: 20.0000 meq | EXTENDED_RELEASE_TABLET | Freq: Every day | ORAL | 0 refills | Status: DC

## 2020-06-17 MED ORDER — PROPRANOLOL HCL 20 MG PO TABS: 20.0000 mg | ORAL_TABLET | Freq: Three times a day (TID) | ORAL | 0 refills | Status: DC

## 2020-06-17 MED ORDER — TRAZODONE HCL 50 MG PO TABS: 50.0000 mg | ORAL_TABLET | Freq: Every day | ORAL | 0 refills | Status: DC

## 2020-06-17 NOTE — Progress Notes (Signed)
Recreational Therapy Discharge Summary Patient Details  Name: NATHANYAL ASHMEAD MRN: [id number] Date of Birth: 05-18-1950 Today's Date: 06/17/2020  Long term goals set: 1  Long term goals met: 1  Comments on progress toward goals: Pt has made good progress during LOS and is discharging home with wife to provide/coordinate 24 hour care.   TR sessions focused on activity tolerance, sitting/standing balance, reaching activities during co-treat with PT or OT.  Pt is discharging at [facility] assist w/c level, needing verbal cues/instructions for task completion.    Reasons for discharge: discharge from hospital  Patient/family agrees with progress made and goals achieved: Yes  Coburn Knaus 06/17/2020, 8:26 AM

## 2020-06-17 NOTE — Progress Notes (Signed)
Patient discharged off of unit with all belongings via [place]. Discharge papers/instructions explained by physician assistant to family. Patient and family have no further questions at time of discharge. No complications noted at this time.  Anthony Downs

## 2020-06-17 NOTE — Progress Notes (Signed)
Inpatient Rehabilitation Care Coordinator Discharge Note  The overall goal for the admission was met for:   Discharge location: Yes. D/c to  Home with 24/7 care from pt wife, and various friends.   Length of Stay: Yes. 45 days.   Discharge activity level: Yes. Minimal Assistance with stedy; power wheelchair level.   Home/community participation: Yes. Limited.   Services provided included: MD, RD, PT, OT, SLP, RN, CM, TR, Pharmacy, Neuropsych and SW  Financial Services: Medicare and Private Insurance: [place] offered to/list presented to:Yes  Follow-up services arranged: Home Health: [facility] for HHPT/OT/SLP/aide and DME: [facility] for hospital bed and power wheelchair; [place] for over the bedside table  Comments (or additional information):  Patient/Family verbalized understanding of follow-up arrangements: Yes  Individual responsible for coordination of the follow-up plan: contact pt wife [name] [phone]  Confirmed correct DME delivered: Rana Snare 06/17/2020    Rana Snare

## 2020-06-17 NOTE — Progress Notes (Signed)
PROGRESS NOTE   Subjective/Complaints:  In good spirits. EXCITED to be going home!. Tremor seems better  ROS: Patient denies fever, rash, sore throat, blurred vision, nausea, vomiting, diarrhea, cough, shortness of breath or chest pain, joint or back pain, headache, or mood change.    Objective:   No results found. Recent Labs    06/16/20 1059 06/17/20 0456  WBC 8.5 7.6  HGB 13.2 12.6*  HCT 40.9 39.0  PLT 239 215   Recent Labs    06/16/20 1059 06/17/20 0456  NA 137 137  K 3.7 3.6  CL 104 104  CO2 28 29  GLUCOSE 119* 126*  BUN 7* <5*  CREATININE 1.00 0.88  CALCIUM 9.0 8.9    Intake/Output Summary (Last 24 hours) at 06/17/2020 0956 Last data filed at 06/17/2020 0710 Gross per 24 hour  Intake 300 ml  Output 150 ml  Net 150 ml        Physical Exam: Vital Signs Blood pressure (!) 122/96, pulse 71, temperature 99.8 F (37.7 C), temperature source Oral, resp. rate 18, height 6' (1.829 m), weight 124.5 kg, SpO2 96 %.  Constitutional: No distress . Vital signs reviewed. HEENT: EOMI, oral membranes moist Neck: supple Cardiovascular: RRR without murmur. No JVD    Respiratory/Chest: CTA Bilaterally without wheezes or rales. Normal effort    GI/Abdomen: BS +, non-tender, non-distended Ext: no clubbing, cyanosis, or edema Psych: pleasant and cooperative, smiling today! Skin: scalp wound healed .  Musc: No edema in extremities.  No tenderness in extremities. Neuro: fairly Alert, more delayed today,  Motor: LUE: 4/5 proximal distal LLE: Hip flexion, knee extension 1+/5, ankle dorsiflexion tr-1/5 RUE : 3-4/5  Right upper extremity tremor of lower amplitude    Assessment/Plan: 1. Functional deficits which require 3+ hours per day of interdisciplinary therapy in a comprehensive inpatient rehab setting.  Physiatrist is providing close team supervision and 24 hour management of active medical problems listed  below.  Physiatrist and rehab team continue to assess barriers to discharge/monitor patient progress toward functional and medical goals  Care Tool:  Bathing    Body parts bathed by patient: Right arm,Left arm,Chest,Abdomen,Front perineal area,Right upper leg,Left upper leg,Right lower leg,Left lower leg,Face   Body parts bathed by helper: Buttocks     Bathing assist Assist Level: Minimal Assistance - Patient > 75%     Upper Body Dressing/Undressing Upper body dressing   What is the patient wearing?: Pull over shirt    Upper body assist Assist Level: Minimal Assistance - Patient > 75%    Lower Body Dressing/Undressing Lower body dressing      What is the patient wearing?: Pants     Lower body assist Assist for lower body dressing: Maximal Assistance - Patient 25 - 49%     Toileting Toileting    Toileting assist Assist for toileting: Maximal Assistance - Patient 25 - 49%     Transfers Chair/bed transfer  Transfers assist  Chair/bed transfer activity did not occur: Safety/medical concerns  Chair/bed transfer assist level: Dependent - mechanical lift Chair/bed transfer assistive device: Other (Stedy)   Locomotion Ambulation   Ambulation assist   Ambulation activity did not occur: Safety/medical concerns  Assist level: Dependent - Patient 0% Assistive device: Lite Gait Max distance: 40 ft   Walk 10 feet activity   Assist  Walk 10 feet activity did not occur: Safety/medical concerns  Assist level: Dependent - Patient 0% Assistive device: Lite Gait   Walk 50 feet activity   Assist Walk 50 feet with 2 turns activity did not occur: Safety/medical concerns (decreased strength/activity tolerance)         Walk 150 feet activity   Assist Walk 150 feet activity did not occur: Safety/medical concerns         Walk 10 feet on uneven surface  activity   Assist Walk 10 feet on uneven surfaces activity did not occur: Safety/medical concerns  (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)         Wheelchair     Assist Will patient use wheelchair at discharge?: Yes Type of Wheelchair: Power    Wheelchair assist level: Supervision/Verbal cueing Max wheelchair distance: >1000 ft    Wheelchair 50 feet with 2 turns activity    Assist    Wheelchair 50 feet with 2 turns activity did not occur: Safety/medical concerns   Assist Level: Supervision/Verbal cueing   Wheelchair 150 feet activity     Assist  Wheelchair 150 feet activity did not occur: Safety/medical concerns   Assist Level: Supervision/Verbal cueing   Blood pressure (!) 122/96, pulse 71, temperature 99.8 F (37.7 C), temperature source Oral, resp. rate 18, height 6' (1.829 m), weight 124.5 kg, SpO2 96 %.    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 He is status post tumor resection 04/29/2020.   -dc home today  -may f/u with [facility] via VIRTUAL visit initially   -propranolol for RUE tremor seems to have helped 2. Left posterior tibial vein DVT:  Vascular ultrasound positive for age indeterminate left posterior tibial vein.   -continue xarelto. -antiplatelet therapy: N/A 3. Post-operative pain:tylenol prn  -voltaren gel for bilateral shoulder pain  -continue baclofen 10mg  qhs for cramps and to assist with sleep  Appears to be controlled on 5/24 4. Mood/sleep:  improved  -improved with scheduled trazodone 50mg  and melatonin ---continue  - rx pain, on baclofen too -antipsychotic agents: N/A  -ritalin for activation and initiation--wife worried it's making him more ?nervous, restless. decreased to 5mg   5/24---with less restlessness? ---dc ritalin   -limiting antispasmodics d/t neurosedation    5. Neuropsych: This patientiscapable of  making decisions on hisown behalf. 6. Skin/Wound Care:rash resolved 7. Fluids/Electrolytes/Nutrition:eating well     -Potassium level borderline---supplemented    5/27 3.4---resumed supplementation 5/28 8. Hypertension.    -Restarted home Flomax HS which will also help with BP   -no norvasc d/t swelling   5/20 lasix increased to 40mg  daily for elevated bp's   5/26 changed metoprolol to propranolol d/t tremor   5/30 continue same meds/ bp better controlled Vitals:   06/16/20 1934 06/17/20 0443  BP: 123/83 (!) 122/96  Pulse: 66 71  Resp: 18 18  Temp: 98 F (36.7 C) 99.8 F (37.7 C)  SpO2: 96% 96%   9. Hyperlipidemia. Lipitor 10. BPH: increased Flomax to 0.8mg  HS  11. Urinary frequency: mostly incontinent  -UA negative.  timed voiding q2H while awake with urinal- placed nursing order   -continue Flomax to 0.8mg  given continued symptoms and HTN  -5/30--ucx +klebsiella--dc home on augmentin 12. Leukocytosis.  Resolved off decadron 13. Constipation:  -continue senna-s bid          LOS: 46 days A FACE TO FACE EVALUATION WAS PERFORMED  [name] 06/17/2020, 9:56 AM

## 2020-06-17 NOTE — Plan of Care (Signed)
  Problem: Consults Goal: RH BRAIN INJURY PATIENT EDUCATION Description: Description: See Patient Education module for eduction specifics Outcome: Completed/Met Goal: Skin Care Protocol Initiated - if Braden Score 18 or less Description: If consults are not indicated, leave blank or document N/A Outcome: Completed/Met   Problem: RH BOWEL ELIMINATION Goal: RH STG MANAGE BOWEL WITH ASSISTANCE Description: STG Manage Bowel with mod Assistance. Outcome: Completed/Met Goal: RH STG MANAGE BOWEL W/MEDICATION W/ASSISTANCE Description: STG Manage Bowel with Medication with mod Assistance. Outcome: Completed/Met   Problem: RH SKIN INTEGRITY Goal: RH STG MAINTAIN SKIN INTEGRITY WITH ASSISTANCE Description: STG Maintain Skin Integrity With mod Assistance. Outcome: Completed/Met Goal: RH STG ABLE TO PERFORM INCISION/WOUND CARE W/ASSISTANCE Description: STG Able To Perform Incision/Wound Care With mod Assistance. Outcome: Completed/Met   Problem: RH SAFETY Goal: RH STG ADHERE TO SAFETY PRECAUTIONS W/ASSISTANCE/DEVICE Description: STG Adhere to Safety Precautions With mod Assistance/Device. Outcome: Completed/Met   Problem: RH PAIN MANAGEMENT Goal: RH STG PAIN MANAGED AT OR BELOW PT'S PAIN GOAL Description: <3 on a 0-10 pain scale. Outcome: Completed/Met   Problem: RH KNOWLEDGE DEFICIT BRAIN INJURY Goal: RH STG INCREASE KNOWLEDGE OF SELF CARE AFTER BRAIN INJURY Description: Patient will be able to demonstrate knowledge of medication management, dietary management, skin/wound care, safety awareness with educational materials and handouts provided by staff, at discharge independently. Outcome: Completed/Met

## 2020-06-19 ENCOUNTER — Telehealth: Payer: Self-pay

## 2020-06-19 NOTE — Telephone Encounter (Signed)
Hospital discharge notes reviewed:   Verbal order given to Linna Hoff with [facility] for in-home  PT once a week for 4 weeks, then twice a week for 5 weeks.

## 2020-06-20 ENCOUNTER — Telehealth: Payer: Self-pay | Admitting: Physical Medicine & Rehabilitation

## 2020-06-20 ENCOUNTER — Telehealth: Payer: Self-pay

## 2020-06-20 NOTE — Telephone Encounter (Signed)
Date: 06/20/2020 [facility] [address] [place], [place] [address] Member Name: Anthony Downs Member ID Number: [id number] Thank you for trusting your Medicare prescription drug coverage to [employer] ([place]). As our member, we want to help you get the most value from your prescription drug coverage and help you understand how your coverage works. As a member of [employer] ([place]), we are pleased to inform you that, upon review of the information provided by you or your doctor, we have approved the requested coverage for the following prescription drug(s): HYDROXYZINE HCL Tablet Type of coverage approved: Prior Authorization This approval authorizes your coverage from 01/19/2020 - 06/20/2021, unless we notify you otherwise, and as long as the following conditions apply:

## 2020-06-20 NOTE — Telephone Encounter (Signed)
Yes that's fine 

## 2020-06-20 NOTE — Telephone Encounter (Signed)
Patient wife called. He does not have transportation and requested MyChart visit for 06/25/20. She stated she had spoked to you about the visit. Please advise.

## 2020-06-20 NOTE — Telephone Encounter (Signed)
Anthony Downs was recently prescribed Rx Xarelto. His wife called stating it too expensive.   Please advise or re-prescribe.   Call back phone [phone] [name] (wife)

## 2020-06-20 NOTE — Telephone Encounter (Signed)
Per Mrs. Husby a Prior Authorization is needed for Hydroxyzine. PA was submitted today 06/20/2020.  GROUP RXCVSD id- [id number] Bin- [id number] pcn -meddadv

## 2020-06-20 NOTE — Telephone Encounter (Signed)
Diclofenac Sodium 1% gel Prior Authorization was submitted on 06/20/2020.

## 2020-06-23 ENCOUNTER — Telehealth: Payer: Self-pay | Admitting: Physical Medicine & Rehabilitation

## 2020-06-23 NOTE — Telephone Encounter (Signed)
That would be fine 

## 2020-06-23 NOTE — Telephone Encounter (Signed)
Per deidre at [facility] she would like to add on social work to help id local resouces.  Her number [id number]

## 2020-06-25 ENCOUNTER — Encounter: Payer: Medicare Other | Attending: Physical Medicine & Rehabilitation | Admitting: Physical Medicine & Rehabilitation

## 2020-06-25 ENCOUNTER — Other Ambulatory Visit: Payer: Self-pay

## 2020-06-25 ENCOUNTER — Encounter: Payer: Self-pay | Admitting: Physical Medicine & Rehabilitation

## 2020-06-25 VITALS — BP 140/60 | Ht 73.0 in | Wt 264.0 lb

## 2020-06-25 DIAGNOSIS — D329 Benign neoplasm of meninges, unspecified: Secondary | ICD-10-CM | POA: Diagnosis not present

## 2020-06-25 DIAGNOSIS — G8250 Quadriplegia, unspecified: Secondary | ICD-10-CM

## 2020-06-25 DIAGNOSIS — F05 Delirium due to known physiological condition: Secondary | ICD-10-CM

## 2020-06-25 NOTE — Telephone Encounter (Signed)
Notified. 

## 2020-06-25 NOTE — Telephone Encounter (Signed)
I attempted to let Anthony Downs know but there was no name attached to VM so I have asked that she call the office.

## 2020-06-25 NOTE — Progress Notes (Signed)
Subjective:    Patient ID: Anthony Downs, male    DOB: March 22, 1950, 70 y.o.   MRN: [id number]  HPI   The patient has consented to a tele-health/telephone encounter with [facility]. The patient is at home and the provider is at the office. Two separate patient identifiers were utilized to confirm the identity of the patient.    Anthony Downs is meeting with me today via telehealth visit in regards to his bilateral frontal lobe meningiomas which required resection.   He has had confusion at night with restlessness and agitation.  Wife states that he is frequently grabbing and pulling at things and seems to be having some hallucinations.  He has completed treatment for his UTI.  He remains on baclofen at night for spasms as well as trazodone for sleep which seem to really help in the hospital.  Home health therapy began coming out to the house this week.  He has had evaluations last couple days apparently.  I spoke with the home health speech pathologist who stated that there is not a nurse coming out to the house currently.  Wife states that his appetite is beginning to pick up a bit.  He is moving his bowels and bladder.  He denies any pain.  Tremor seems a bit better as well.  Patient himself feels that he is doing okay during the day and seems somewhat motivated.  He does spend time up in his wheelchair during the day as much as possible.  There are no reports of skin breakdown.   Pain Inventory Average Pain 0 Pain Right Now 0 My pain is no pain  In the last 24 hours, has pain interfered with the following? General activity 0 Relation with others 0 Enjoyment of life 0 What TIME of day is your pain at its worst? No pain Sleep (in general) varies  Pain is worse with: no pain Pain improves with: no pain Relief from Meds: no pain  Family History  Problem Relation Age of Onset  . Breast cancer Mother   . Aortic aneurysm Father    Social  History   Socioeconomic History  . Marital status: Married    Spouse name: Not on file  . Number of children: Not on file  . Years of education: Not on file  . Highest education level: Not on file  Occupational History  . Not on file  Tobacco Use  . Smoking status: Never Smoker  . Smokeless tobacco: Never Used  Vaping Use  . Vaping Use: Never used  Substance and Sexual Activity  . Alcohol use: No  . Drug use: No  . Sexual activity: Yes  Other Topics Concern  . Not on file  Social History Narrative  . Not on file   Social Determinants of Health   Financial Resource Strain: Not on file  Food Insecurity: Not on file  Transportation Needs: Not on file  Physical Activity: Not on file  Stress: Not on file  Social Connections: Not on file   Past Surgical History:  Procedure Laterality Date  . APPLICATION OF CRANIAL NAVIGATION Left 04/29/2020   Procedure: APPLICATION OF CRANIAL NAVIGATION;  Surgeon: Judith Part, MD;  Location: [place];  Service: Neurosurgery;  Laterality: Left;  . arthroscopic knee    . COLONOSCOPY N/A 11/29/2018   Procedure: COLONOSCOPY;  Surgeon: Rogene Houston, MD;  [facility];  Service: Endoscopy;  Laterality: N/A;  730-rescheduled 9/2 same time per  Linward Foster Left 04/29/2020   Procedure: LEFT CRANIECTOMY WITH TUMOR EXCISION;  Surgeon: Judith Part, MD;  Location: [place];  Service: Neurosurgery;  Laterality: Left;  . NASAL SEPTOPLASTY W/ TURBINOPLASTY Bilateral 03/19/2020   Procedure: NASAL SEPTOPLASTY WITH BILATERAL  TURBINATE REDUCTION;  Surgeon: Leta Baptist, MD;  Location: [facility];  Service: ENT;  Laterality: Bilateral;  . VASECTOMY     Past Surgical History:  Procedure Laterality Date  . APPLICATION OF CRANIAL NAVIGATION Left 04/29/2020   Procedure: APPLICATION OF CRANIAL NAVIGATION;  Surgeon: Judith Part, MD;  Location: [facility];  Service: Neurosurgery;  Laterality: Left;  . arthroscopic knee    . COLONOSCOPY N/A  11/29/2018   Procedure: COLONOSCOPY;  Surgeon: Rogene Houston, MD;  [facility];  Service: Endoscopy;  Laterality: N/A;  730-rescheduled 9/2 same time per Lelon Frohlich  . CRANIOTOMY Left 04/29/2020   Procedure: LEFT CRANIECTOMY WITH TUMOR EXCISION;  Surgeon: Judith Part, MD;  Location: [facility];  Service: Neurosurgery;  Laterality: Left;  . NASAL SEPTOPLASTY W/ TURBINOPLASTY Bilateral 03/19/2020   Procedure: NASAL SEPTOPLASTY WITH BILATERAL  TURBINATE REDUCTION;  Surgeon: Leta Baptist, MD;  Location: [facility] [place];  Service: ENT;  Laterality: Bilateral;  . VASECTOMY     Past Medical History:  Diagnosis Date  . Arthritis   . Asthma    as a child  . 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 Complication of anesthesia    slow to wake up and pain medicines make him sick  . GERD (gastroesophageal reflux disease)   . Hypertension   . PONV (postoperative nausea and vomiting)   . Sleep apnea    no Cpap  . Subdural hematoma, post-traumatic ([place]) 2008   fell off truck hit head on concrete   BP 140/60 Comment: wife rports  Ht 6\' 1"  (1.854 m)   Wt 264 lb (119.7 kg)   BMI 34.83 kg/m   Opioid Risk Score:   Fall Risk Score:  `1  Depression screen PHQ 2/9  No flowsheet data found.  Review of Systems  Constitutional: Negative.   HENT: Negative.   Eyes: Negative.   Respiratory: Negative.   Cardiovascular: Negative.   Gastrointestinal: Negative.   Endocrine: Negative.   Genitourinary: Negative.   Musculoskeletal: Positive for gait problem.  Skin: Negative.   Allergic/Immunologic: Negative.   Neurological: Positive for weakness.  Hematological: Negative.   Psychiatric/Behavioral: Positive for confusion, hallucinations and sleep disturbance. The patient is nervous/anxious.   All other systems reviewed and are negative.      Objective:   Physical Exam       Assessment & Plan:  1. Incomplete tetraplegia with cognitive/linguistic deficits due to  bilateral frontal grade 1 meningiomas status post tumor resection on April 29, 2020  -continue HH PT, OT, SLP  -OOB as much as possible  -wife looking into respite care at night which I think is a good idea 2.  Left posterior tibial vein DVT: Patient remains on Xarelto 3.  Sleep disorder/evening hallucinations/sundowning  -was demonstrating some of this in the hospital  -hold baclofen for now  -re-check UA, UCX, spoke with [facility] SLP who will order [facility] to come to home.  -consider holding trazodone also based on response to above 4.  Hypertension--HHRN to check 5.  Urinary frequency with incontinence: History of recent urinary tract infection.  Patient was discharged home on Augmentin 06/17/2020  -see #3  12 minutes of  tele-visit time was spent with this patient today. Follow up in 2 months

## 2020-06-26 ENCOUNTER — Telehealth: Payer: Self-pay

## 2020-06-26 NOTE — Telephone Encounter (Signed)
Call back phone [phone].  [name] complaints of a sore tongue. [name] RN with [name] has requested  Rx swoosh & swallow.   If granted please send to the local pharmacy.

## 2020-07-06 ENCOUNTER — Emergency Department (HOSPITAL_COMMUNITY): Payer: Medicare Other

## 2020-07-06 ENCOUNTER — Encounter (HOSPITAL_COMMUNITY): Payer: Self-pay

## 2020-07-06 ENCOUNTER — Encounter: Payer: Self-pay | Admitting: Family Medicine

## 2020-07-06 ENCOUNTER — Other Ambulatory Visit: Payer: Self-pay

## 2020-07-06 ENCOUNTER — Inpatient Hospital Stay (HOSPITAL_COMMUNITY)
Admission: EM | Admit: 2020-07-06 | Discharge: 2020-07-15 | DRG: 070 | Disposition: A | Discharge status: Home Health | Payer: Medicare Other | Attending: Internal Medicine | Admitting: Internal Medicine

## 2020-07-06 DIAGNOSIS — G8250 Quadriplegia, unspecified: Secondary | ICD-10-CM | POA: Diagnosis present

## 2020-07-06 DIAGNOSIS — J45909 Unspecified asthma, uncomplicated: Secondary | ICD-10-CM | POA: Diagnosis present

## 2020-07-06 DIAGNOSIS — Z23 Encounter for immunization: Secondary | ICD-10-CM | POA: Diagnosis present

## 2020-07-06 DIAGNOSIS — Z85828 Personal history of other malignant neoplasm of skin: Secondary | ICD-10-CM | POA: Diagnosis not present

## 2020-07-06 DIAGNOSIS — G4730 Sleep apnea, unspecified: Secondary | ICD-10-CM | POA: Diagnosis present

## 2020-07-06 DIAGNOSIS — E669 Obesity, unspecified: Secondary | ICD-10-CM | POA: Diagnosis present

## 2020-07-06 DIAGNOSIS — K219 Gastro-esophageal reflux disease without esophagitis: Secondary | ICD-10-CM | POA: Insufficient documentation

## 2020-07-06 DIAGNOSIS — N390 Urinary tract infection, site not specified: Secondary | ICD-10-CM | POA: Clinically undetermined

## 2020-07-06 DIAGNOSIS — Z72820 Sleep deprivation: Secondary | ICD-10-CM

## 2020-07-06 DIAGNOSIS — E538 Deficiency of other specified B group vitamins: Secondary | ICD-10-CM | POA: Diagnosis present

## 2020-07-06 DIAGNOSIS — Z781 Physical restraint status: Secondary | ICD-10-CM | POA: Diagnosis not present

## 2020-07-06 DIAGNOSIS — F05 Delirium due to known physiological condition: Secondary | ICD-10-CM | POA: Diagnosis not present

## 2020-07-06 DIAGNOSIS — E876 Hypokalemia: Secondary | ICD-10-CM | POA: Diagnosis present

## 2020-07-06 DIAGNOSIS — Z86011 Personal history of benign neoplasm of the brain: Secondary | ICD-10-CM | POA: Diagnosis not present

## 2020-07-06 DIAGNOSIS — D329 Benign neoplasm of meninges, unspecified: Secondary | ICD-10-CM | POA: Diagnosis present

## 2020-07-06 DIAGNOSIS — Z79899 Other long term (current) drug therapy: Secondary | ICD-10-CM | POA: Diagnosis not present

## 2020-07-06 DIAGNOSIS — R4701 Aphasia: Secondary | ICD-10-CM | POA: Diagnosis present

## 2020-07-06 DIAGNOSIS — Z20822 Contact with and (suspected) exposure to covid-19: Secondary | ICD-10-CM | POA: Diagnosis present

## 2020-07-06 DIAGNOSIS — Z7901 Long term (current) use of anticoagulants: Secondary | ICD-10-CM | POA: Diagnosis not present

## 2020-07-06 DIAGNOSIS — Z86718 Personal history of other venous thrombosis and embolism: Secondary | ICD-10-CM

## 2020-07-06 DIAGNOSIS — G8191 Hemiplegia, unspecified affecting right dominant side: Secondary | ICD-10-CM | POA: Diagnosis present

## 2020-07-06 DIAGNOSIS — R4182 Altered mental status, unspecified: Secondary | ICD-10-CM

## 2020-07-06 DIAGNOSIS — Z6835 Body mass index (BMI) 35.0-35.9, adult: Secondary | ICD-10-CM

## 2020-07-06 DIAGNOSIS — G9341 Metabolic encephalopathy: Secondary | ICD-10-CM | POA: Diagnosis present

## 2020-07-06 DIAGNOSIS — R410 Disorientation, unspecified: Secondary | ICD-10-CM | POA: Diagnosis present

## 2020-07-06 DIAGNOSIS — I10 Essential (primary) hypertension: Secondary | ICD-10-CM | POA: Diagnosis present

## 2020-07-06 DIAGNOSIS — G4733 Obstructive sleep apnea (adult) (pediatric): Secondary | ICD-10-CM | POA: Diagnosis present

## 2020-07-06 DIAGNOSIS — R319 Hematuria, unspecified: Secondary | ICD-10-CM | POA: Diagnosis not present

## 2020-07-06 LAB — URINALYSIS, ROUTINE W REFLEX MICROSCOPIC
Bilirubin Urine: NEGATIVE
Glucose, UA: NEGATIVE mg/dL
Hgb urine dipstick: NEGATIVE
Ketones, ur: 5 mg/dL — AB
Leukocytes,Ua: NEGATIVE
Nitrite: NEGATIVE
Protein, ur: NEGATIVE mg/dL
Specific Gravity, Urine: 1.027 (ref 1.005–1.030)
pH: 5 (ref 5.0–8.0)

## 2020-07-06 LAB — CBC WITH DIFFERENTIAL/PLATELET
Abs Immature Granulocytes: 0.01 10*3/uL (ref 0.00–0.07)
Basophils Absolute: 0 10*3/uL (ref 0.0–0.1)
Basophils Relative: 0 %
Eosinophils Absolute: 0.2 10*3/uL (ref 0.0–0.5)
Eosinophils Relative: 2 %
HCT: 41.8 % (ref 39.0–52.0)
Hemoglobin: 13.5 g/dL (ref 13.0–17.0)
Immature Granulocytes: 0 %
Lymphocytes Relative: 38 %
Lymphs Abs: 3 10*3/uL (ref 0.7–4.0)
MCH: 30.1 pg (ref 26.0–34.0)
MCHC: 32.3 g/dL (ref 30.0–36.0)
MCV: 93.3 fL (ref 80.0–100.0)
Monocytes Absolute: 0.7 10*3/uL (ref 0.1–1.0)
Monocytes Relative: 9 %
Neutro Abs: 4 10*3/uL (ref 1.7–7.7)
Neutrophils Relative %: 51 %
Platelets: 204 10*3/uL (ref 150–400)
RBC: 4.48 MIL/uL (ref 4.22–5.81)
RDW: 13.1 % (ref 11.5–15.5)
WBC: 7.8 10*3/uL (ref 4.0–10.5)
nRBC: 0 % (ref 0.0–0.2)

## 2020-07-06 LAB — COMPREHENSIVE METABOLIC PANEL
ALT: 21 U/L (ref 0–44)
AST: 17 U/L (ref 15–41)
Albumin: 3.7 g/dL (ref 3.5–5.0)
Alkaline Phosphatase: 81 U/L (ref 38–126)
Anion gap: 8 (ref 5–15)
BUN: 12 mg/dL (ref 8–23)
CO2: 26 mmol/L (ref 22–32)
Calcium: 9 mg/dL (ref 8.9–10.3)
Chloride: 105 mmol/L (ref 98–111)
Creatinine, Ser: 0.91 mg/dL (ref 0.61–1.24)
GFR, Estimated: >60 mL/min (ref >60)
Glucose, Bld: 113 mg/dL — ABNORMAL HIGH (ref 70–99)
Potassium: 4 mmol/L (ref 3.5–5.1)
Sodium: 139 mmol/L (ref 135–145)
Total Bilirubin: 0.3 mg/dL (ref 0.3–1.2)
Total Protein: 6.6 g/dL (ref 6.5–8.1)

## 2020-07-06 MED ORDER — RIVAROXABAN 20 MG PO TABS
20.0000 mg | ORAL_TABLET | Freq: Every day | ORAL | Status: DC
  Administered 2020-07-06 – 2020-07-15 (×10): 20 mg via ORAL
  Filled 2020-07-06 (×10): qty 1

## 2020-07-06 MED ORDER — POTASSIUM CHLORIDE CRYS ER 20 MEQ PO TBCR
20.0000 meq | EXTENDED_RELEASE_TABLET | Freq: Every day | ORAL | Status: DC
  Administered 2020-07-07: 20 meq via ORAL
  Filled 2020-07-06: qty 1

## 2020-07-06 MED ORDER — KETOROLAC TROMETHAMINE 30 MG/ML IJ SOLN
15.0000 mg | Freq: Once | INTRAMUSCULAR | Status: AC
  Administered 2020-07-06: 15 mg via INTRAVENOUS
  Filled 2020-07-06: qty 1

## 2020-07-06 MED ORDER — SENNOSIDES-DOCUSATE SODIUM 8.6-50 MG PO TABS
2.0000 | ORAL_TABLET | Freq: Two times a day (BID) | ORAL | Status: DC
  Administered 2020-07-06 – 2020-07-09 (×6): 2 via ORAL
  Filled 2020-07-06 (×6): qty 2

## 2020-07-06 MED ORDER — ATORVASTATIN CALCIUM 10 MG PO TABS
10.0000 mg | ORAL_TABLET | Freq: Every day | ORAL | Status: DC
  Administered 2020-07-06: 10 mg via ORAL
  Filled 2020-07-06: qty 1

## 2020-07-06 MED ORDER — PANTOPRAZOLE SODIUM 40 MG PO TBEC
40.0000 mg | DELAYED_RELEASE_TABLET | Freq: Every day | ORAL | Status: DC
  Administered 2020-07-06 – 2020-07-15 (×10): 40 mg via ORAL
  Filled 2020-07-06 (×10): qty 1

## 2020-07-06 MED ORDER — TAMSULOSIN HCL 0.4 MG PO CAPS
0.8000 mg | ORAL_CAPSULE | Freq: Every morning | ORAL | Status: DC
  Administered 2020-07-07 – 2020-07-15 (×9): 0.8 mg via ORAL
  Filled 2020-07-06 (×8): qty 2

## 2020-07-06 MED ORDER — LORAZEPAM 2 MG/ML IJ SOLN
1.0000 mg | Freq: Once | INTRAMUSCULAR | Status: AC
  Administered 2020-07-06: 1 mg via INTRAVENOUS
  Filled 2020-07-06: qty 1

## 2020-07-06 MED ORDER — PROPRANOLOL HCL 20 MG PO TABS
20.0000 mg | ORAL_TABLET | Freq: Three times a day (TID) | ORAL | Status: DC
  Administered 2020-07-06 – 2020-07-15 (×26): 20 mg via ORAL
  Filled 2020-07-06 (×21): qty 1
  Filled 2020-07-06: qty 2
  Filled 2020-07-06 (×2): qty 1
  Filled 2020-07-06: qty 2
  Filled 2020-07-06 (×4): qty 1

## 2020-07-06 MED ORDER — ONDANSETRON HCL 4 MG/2ML IJ SOLN: 4.0000 mg | Freq: Four times a day (QID) | INTRAMUSCULAR | Status: DC | PRN

## 2020-07-06 MED ORDER — LORAZEPAM 2 MG/ML IJ SOLN: 1.0000 mg | INTRAMUSCULAR | Status: DC | PRN

## 2020-07-06 MED ORDER — FUROSEMIDE 40 MG PO TABS
20.0000 mg | ORAL_TABLET | Freq: Every day | ORAL | Status: DC
  Administered 2020-07-07: 20 mg via ORAL
  Filled 2020-07-06: qty 1

## 2020-07-06 MED ORDER — TRAZODONE HCL 50 MG PO TABS
50.0000 mg | ORAL_TABLET | Freq: Every day | ORAL | Status: DC
  Administered 2020-07-06 – 2020-07-07 (×2): 50 mg via ORAL
  Filled 2020-07-06 (×2): qty 1

## 2020-07-06 MED ORDER — ONDANSETRON HCL 4 MG PO TABS: 4.0000 mg | ORAL_TABLET | Freq: Four times a day (QID) | ORAL | Status: DC | PRN
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
## 2020-07-06 NOTE — ED Provider Notes (Signed)
  Face-to-face evaluation   History: Patient here for evaluation of confusion, associated with difficulty sleeping, as well as a sensation of his feeling agitated and anxious.  He also has right hip pain and right shoulder pain from "arthritis."  He was discharged from hospital about 2 weeks ago following meningioma resection.  He is not using narcotic pain relievers.  He is using Atarax at night to help sleep, but was recently stopped because his dreams were so vivid that they became "like they were real."  He is not having fever.  He does not drink alcohol.  He does not use illegal drugs.  He prefers to avoid narcotics.  He has a history of sleep apnea, diagnosed remotely, never on CPAP.  He recently had nasal surgery, and might be able to better tolerate CPAP now because of that.  Physical exam: Elderly male, alert, overweight.  No dysarthria.  He has very mild word finding disorder.  He has hemiparesis, right arm and right leg.  Medical screening examination/treatment/procedure(s) were conducted as a shared visit with non-physician practitioner(s) and myself.  I personally evaluated the patient during the encounter    Daleen Bo, MD 07/07/20 1158
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
## 2020-07-06 NOTE — ED Triage Notes (Signed)
Bib by ems from home for ams.  Reports having episodes of confusion and throwing things..  reports strong urine odor and frequency.  Symptoms started on Friday.   Hx of brain turmor.

## 2020-07-07 ENCOUNTER — Inpatient Hospital Stay (HOSPITAL_COMMUNITY): Payer: Medicare Other

## 2020-07-07 LAB — BASIC METABOLIC PANEL
Anion gap: 7 (ref 5–15)
BUN: 13 mg/dL (ref 8–23)
CO2: 26 mmol/L (ref 22–32)
Calcium: 9.1 mg/dL (ref 8.9–10.3)
Chloride: 104 mmol/L (ref 98–111)
Creatinine, Ser: 0.92 mg/dL (ref 0.61–1.24)
GFR, Estimated: >60 mL/min (ref >60)
Glucose, Bld: 107 mg/dL — ABNORMAL HIGH (ref 70–99)
Potassium: 3.7 mmol/L (ref 3.5–5.1)
Sodium: 137 mmol/L (ref 135–145)

## 2020-07-07 LAB — CBC
HCT: 40.8 % (ref 39.0–52.0)
Hemoglobin: 13.1 g/dL (ref 13.0–17.0)
MCH: 30.1 pg (ref 26.0–34.0)
MCHC: 32.1 g/dL (ref 30.0–36.0)
MCV: 93.8 fL (ref 80.0–100.0)
Platelets: 183 10*3/uL (ref 150–400)
RBC: 4.35 MIL/uL (ref 4.22–5.81)
RDW: 13 % (ref 11.5–15.5)
WBC: 7.1 10*3/uL (ref 4.0–10.5)
nRBC: 0 % (ref 0.0–0.2)

## 2020-07-07 LAB — RAPID URINE DRUG SCREEN, HOSP PERFORMED
Amphetamines: NOT DETECTED
Barbiturates: NOT DETECTED
Benzodiazepines: NOT DETECTED
Cocaine: NOT DETECTED
Opiates: NOT DETECTED
Tetrahydrocannabinol: NOT DETECTED

## 2020-07-07 LAB — TSH: TSH: 2.345 u[IU]/mL (ref 0.350–4.500)

## 2020-07-07 LAB — HEMOGLOBIN A1C
Hgb A1c MFr Bld: 6 % — ABNORMAL HIGH (ref 4.8–5.6)
Mean Plasma Glucose: 126 mg/dL

## 2020-07-07 LAB — SARS CORONAVIRUS 2 (TAT 6-24 HRS): SARS Coronavirus 2: NEGATIVE

## 2020-07-07 LAB — VITAMIN B12: Vitamin B-12: 179 pg/mL — ABNORMAL LOW (ref 180–914)

## 2020-07-07 MED ORDER — HALOPERIDOL LACTATE 5 MG/ML IJ SOLN
1.0000 mg | Freq: Four times a day (QID) | INTRAMUSCULAR | Status: DC | PRN
  Administered 2020-07-09 (×2): 2 mg via INTRAVENOUS
  Filled 2020-07-07 (×2): qty 1

## 2020-07-07 MED ORDER — QUETIAPINE FUMARATE 25 MG PO TABS
25.0000 mg | ORAL_TABLET | Freq: Every day | ORAL | Status: DC
  Administered 2020-07-07 – 2020-07-11 (×5): 25 mg via ORAL
  Filled 2020-07-07 (×6): qty 1

## 2020-07-07 MED ORDER — PNEUMOCOCCAL VAC POLYVALENT 25 MCG/0.5ML IJ INJ
0.5000 mL | INJECTION | INTRAMUSCULAR | Status: AC
  Administered 2020-07-08: 0.5 mL via INTRAMUSCULAR
  Filled 2020-07-07: qty 0.5

## 2020-07-07 MED ORDER — GADOBUTROL 1 MMOL/ML IV SOLN
10.0000 mL | Freq: Once | INTRAVENOUS | Status: AC | PRN
  Administered 2020-07-07: 10 mL via INTRAVENOUS

## 2020-07-07 MED ORDER — CYANOCOBALAMIN 1000 MCG/ML IJ SOLN
1000.0000 ug | Freq: Every day | INTRAMUSCULAR | Status: AC
  Administered 2020-07-07 – 2020-07-13 (×7): 1000 ug via SUBCUTANEOUS
  Filled 2020-07-07 (×7): qty 1

## 2020-07-07 NOTE — ED Notes (Signed)
Patient continues to take off gown and sheet after multiple attempts to keep gown an sheet on. Patient AMS

## 2020-07-07 NOTE — ED Notes (Signed)
Patient changed and provided with new brief. Male external catheter also applied. Patient repositioned in bed.

## 2020-07-07 NOTE — ED Notes (Signed)
Patient alert and awake with wife at bedside. Awaiting MRI at this time

## 2020-07-07 NOTE — Progress Notes (Addendum)
Anthony Downs  WNU:[id number] DOB: [birth year] DOA: 07/06/2020 PCP: Leeanne Rio, MD    Brief Narrative:  70 year old with a history of HTN, GERD, OSA not on CPAP due to intolerance, and parasagittal meningioma s/p biparietal craniectomy with resection of meningioma 2 months prior who presented to the ER with confusion.  Since surgery the patient has suffered with aphasia, right-sided weakness, and left lower extremity weakness.  Family reports left lower extremity weakness and aphasia have been slowly improving.  For 2-3 nights prior to his presentation however family had noted increasing generalized confusion with restlessness and nonsensical speech.  CT head in the ED revealed no acute changes.  The ED discussed the patient's case with his neurosurgical team who suggested MRI of brain.  Significant Events:  6/19 admit via [facility] with AMS  Consultants:  None  Code Status: FULL CODE  Antimicrobials:  None  DVT prophylaxis: Xarelto  Subjective: Afebrile.  Vital signs stable.  Saturation 99% on room air.  B12 quite low at 179.  Electrolytes balanced.  Hemoglobin stable.  The patient is alert and conversant though confused and at times moderately agitated.  His wife confirms that he has not slept for many nights in a row at home.  Assessment & Plan:  Acute delirium MRI head without acute findings -possibly related to B12 deficiency versus medication effect +/- sleep deprivation - minimize sedating medications as able - supplement B12 - TSH normal - UA unremarkable - attempt to re-establish night time sleep schedule/daytime alertness   Meningioma status post resection -incomplete tetraplegia with cognitive/linguistic deficits No evidence of acute complications at present - postop deficits stable  Left posterior tibial vein DVT - dx 05/04/20 Continue Xarelto started while patient on rehab unit  HTN Blood pressure reasonably controlled at present  OSA not on  CPAP Monitor while inpatient  GERD Continue PPI   Family Communication: Spoke at length with wife at bedside Status is: Inpatient  Remains inpatient appropriate because:Inpatient level of care appropriate due to severity of illness  Dispo: The patient is from: Home              Anticipated d/c is to:  Unclear              Patient currently is not medically stable to d/c.   Difficult to place patient No    Objective: Blood pressure 121/72, pulse 72, temperature 98.5 F (36.9 C), resp. rate 18, height 6\' 1"  (1.854 m), weight 119.3 kg, SpO2 99 %. No intake or output data in the 24 hours ending 07/07/20 1127 Filed Weights   07/06/20 1534  Weight: 119.3 kg    Examination: General: No acute respiratory distress Lungs: Clear to auscultation bilaterally without wheezes or crackles Cardiovascular: Regular rate and rhythm without murmur gallop or rub normal S1 and S2 Abdomen: Nontender, nondistended, soft, bowel sounds positive, no rebound, no ascites, no appreciable mass Extremities: No significant cyanosis, clubbing, or edema bilateral lower extremities  CBC: Recent Labs  Lab 07/06/20 1642 07/07/20 0430  WBC 7.8 7.1  NEUTROABS 4.0  --   HGB 13.5 13.1  HCT 41.8 40.8  MCV 93.3 93.8  PLT 204 034   Basic Metabolic Panel: Recent Labs  Lab 07/06/20 1642 07/07/20 0430  NA 139 137  K 4.0 3.7  CL 105 104  CO2 26 26  GLUCOSE 113* 107*  BUN 12 13  CREATININE 0.91 0.92  CALCIUM 9.0 9.1   GFR: Estimated Creatinine Clearance: 99.7 mL/min (by  C-G formula based on SCr of 0.92 mg/dL).  Liver Function Tests: Recent Labs  Lab 07/06/20 1642  AST 17  ALT 21  ALKPHOS 81  BILITOT 0.3  PROT 6.6  ALBUMIN 3.7     Scheduled Meds:  atorvastatin  10 mg Oral QHS   furosemide  20 mg Oral Daily   pantoprazole  40 mg Oral Daily   potassium chloride SA  20 mEq Oral Daily   propranolol  20 mg Oral TID   rivaroxaban  20 mg Oral Q supper   senna-docusate  2 tablet Oral BID    tamsulosin  0.8 mg Oral q morning   traZODone  50 mg Oral QHS     LOS: 1 day   Cherene Altes, MD [facility] Office  [phone] Pager - Text Page per Shea Evans  If 7PM-7AM, please contact night-coverage per Amion 07/07/2020, 11:27 AM

## 2020-07-07 NOTE — ED Notes (Signed)
Pt repositioned and cleansed.  Purwick in place.

## 2020-07-07 NOTE — ED Notes (Signed)
Patient transported to MRI 

## 2020-07-07 NOTE — ED Notes (Signed)
Pt cleansed, linen changed, new purwick in place.

## 2020-07-08 ENCOUNTER — Other Ambulatory Visit: Payer: Self-pay | Admitting: Physical Medicine and Rehabilitation

## 2020-07-08 LAB — URINE CULTURE: Culture: 30000 — AB

## 2020-07-08 NOTE — Telephone Encounter (Signed)
Will you check to see if Zella Ball NP can send this Rx to the pharmacy? If he stills need it.

## 2020-07-08 NOTE — Progress Notes (Signed)
Anthony Downs  VFI:[id number] DOB: [birth year] DOA: 07/06/2020 PCP: Leeanne Rio, MD    Brief Narrative:  70 year old with a history of HTN, GERD, OSA not on CPAP due to intolerance, and parasagittal meningioma s/p biparietal craniectomy with resection of meningioma 2 months prior who presented to the ER with confusion.  Since surgery the patient has suffered with aphasia, right-sided weakness, and left lower extremity weakness.  Family reports left lower extremity weakness and aphasia have been slowly improving.  For 2-3 nights prior to his presentation however family had noted increasing generalized confusion with restlessness and nonsensical speech.  CT head in the ED revealed no acute changes.  The ED discussed the patient's case with his neurosurgical team who suggested MRI of brain.  Significant Events:  6/19 admit via [facility] with AMS  Consultants:  None  Code Status: FULL CODE  Antimicrobials:  None  DVT prophylaxis: Xarelto  Subjective: Afebrile.  Vital signs stable.  Patient is more calm at the time of my interaction, but is somewhat groggy.  He denies any new complaints but his history is not felt to be entirely reliable.  I spoke with his wife at length at bedside.  She states he is more calm but not yet back to his baseline.  Assessment & Plan:  Acute delirium MRI head without acute findings -possibly related to B12 deficiency versus medication effect +/- sleep deprivation - minimize sedating medications as able - supplement B12 - TSH normal - UA unremarkable - attempt to re-establish night time sleep schedule/daytime alertness   Meningioma status post resection -incomplete tetraplegia with cognitive/linguistic deficits No evidence of acute complications at present - postop deficits stable  Left posterior tibial vein DVT - dx 05/04/20 Continue Xarelto started while patient on rehab unit -plan will be to complete 3 months of treatment  HTN Blood pressure  controlled  OSA not on CPAP Monitor while inpatient  GERD Continue PPI  Disposition Plan is to continue to minimize sedating medications, continue daily high-dose B12 supplementation, attempt to reestablish day/night routine, and monitor for mental status improvement -begin PT/OT to ambulate patient - home when patient consistently calm and wife confirms mental status back at baseline   Family Communication: Spoke with wife at bedside Status is: Inpatient  Remains inpatient appropriate because:Inpatient level of care appropriate due to severity of illness  Dispo: The patient is from: Home              Anticipated d/c is to:  Unclear              Patient currently is not medically stable to d/c.   Difficult to place patient No    Objective: Blood pressure 133/75, pulse 73, temperature 98 F (36.7 C), temperature source Oral, resp. rate 20, height 6' (1.829 m), weight 118.1 kg, SpO2 96 %.  Intake/Output Summary (Last 24 hours) at 07/08/2020 1201 Last data filed at 07/07/2020 2237 Gross per 24 hour  Intake 520 ml  Output --  Net 520 ml   Filed Weights   07/06/20 1534 07/07/20 1604  Weight: 119.3 kg 118.1 kg    Examination: General: No acute respiratory distress Lungs: CTA B without wheezing Cardiovascular: RRR without murmur or rub Abdomen: NT/ND, soft, BS positive, no rebound Extremities: No C/C/E B LE  CBC: Recent Labs  Lab 07/06/20 1642 07/07/20 0430  WBC 7.8 7.1  NEUTROABS 4.0  --   HGB 13.5 13.1  HCT 41.8 40.8  MCV 93.3 93.8  PLT 204 712    Basic Metabolic Panel: Recent Labs  Lab 07/06/20 1642 07/07/20 0430  NA 139 137  K 4.0 3.7  CL 105 104  CO2 26 26  GLUCOSE 113* 107*  BUN 12 13  CREATININE 0.91 0.92  CALCIUM 9.0 9.1    GFR: Estimated Creatinine Clearance: 97.7 mL/min (by C-G formula based on SCr of 0.92 mg/dL).  Liver Function Tests: Recent Labs  Lab 07/06/20 1642  AST 17  ALT 21  ALKPHOS 81  BILITOT 0.3  PROT 6.6  ALBUMIN 3.7       Scheduled Meds:  cyanocobalamin  1,000 mcg Subcutaneous Daily   pantoprazole  40 mg Oral Daily   propranolol  20 mg Oral TID   QUEtiapine  25 mg Oral QHS   rivaroxaban  20 mg Oral Q supper   senna-docusate  2 tablet Oral BID   tamsulosin  0.8 mg Oral q morning   traZODone  50 mg Oral QHS     LOS: 2 days   Cherene Altes, MD [facility] Office  [phone] Pager - Text Page per Shea Evans  If 7PM-7AM, please contact night-coverage per Amion 07/08/2020, 12:01 PM

## 2020-07-09 DIAGNOSIS — E669 Obesity, unspecified: Secondary | ICD-10-CM

## 2020-07-09 MED ORDER — SENNA 8.6 MG PO TABS
1.0000 | ORAL_TABLET | Freq: Every day | ORAL | Status: DC
  Administered 2020-07-09 – 2020-07-15 (×6): 8.6 mg via ORAL
  Filled 2020-07-09 (×7): qty 1

## 2020-07-09 MED ORDER — SENNOSIDES-DOCUSATE SODIUM 8.6-50 MG PO TABS
2.0000 | ORAL_TABLET | Freq: Every day | ORAL | Status: DC
  Administered 2020-07-10 – 2020-07-15 (×6): 2 via ORAL
  Filled 2020-07-09 (×6): qty 2

## 2020-07-09 MED ORDER — TRAZODONE HCL 50 MG PO TABS
50.0000 mg | ORAL_TABLET | Freq: Every day | ORAL | Status: DC
  Administered 2020-07-09 – 2020-07-10 (×2): 50 mg via ORAL
  Filled 2020-07-09 (×3): qty 1

## 2020-07-09 MED ORDER — HYDROCORTISONE 1 % EX CREA
1.0000 "application " | TOPICAL_CREAM | Freq: Two times a day (BID) | CUTANEOUS | Status: DC
  Administered 2020-07-09 – 2020-07-15 (×12): 1 via TOPICAL
  Filled 2020-07-09 (×3): qty 28

## 2020-07-09 MED ORDER — LORAZEPAM 2 MG/ML IJ SOLN
0.5000 mg | Freq: Four times a day (QID) | INTRAMUSCULAR | Status: DC | PRN
  Administered 2020-07-09 – 2020-07-10 (×2): 0.5 mg via INTRAVENOUS
  Filled 2020-07-09 (×2): qty 1

## 2020-07-09 NOTE — Progress Notes (Addendum)
PROGRESS NOTE  Anthony Downs HQP:[id number] DOB: 1950-12-29 DOA: 07/06/2020 PCP: Leeanne Rio, MD  HPI/Recap of past 10 hours: 70 year old with past medical history of hypertension, obesity, obstructive sleep apnea not on CPAP and parasagittal meningioma status post biparietal craniotomy with resection of meningioma 2 months ago presented to the emergency room on 6/19 with confusion.  Since patient surgery and recovery in inpatient rehab, he has had issues with aphasia, right upper extremity weakness and left lower extremity weakness plus some confusion, worse at night.  Symptoms have continued to progress so patient brought into the emergency room.  CT scan of head unremarkable and patient admitted to the hospitalist service.  MRI unremarkable as well.  Noted to have some B12 deficiency which has been restarted.  Symptoms felt to be from B12 deficiency and/or sleep deprivation.  No signs of infection.  Initially patient started to have some improvement, but at nights when he sundown's, things get worse.  Overnight, was quite confused requiring some Haldol which continued into the morning.  Patient seen around noon and at that point awake and alert and interactive, but somewhat anxious.  Assessment/Plan: Active Problems: Acute metabolic encephalopathy in patient with history of meningioma ([place]) status post resection: Definitely having sundowning and this plus B12 deficiency may be because of some issues.  He also has a history of sleep apnea not on any CPAP because unable to tolerate which may be leading to some chronic hypoxia as well.  CT and MRI unremarkable.  Possibly from medication issues?  We will try some nighttime trazodone to improve his sleep.  Given that he is getting Haldol, will check EKG. may also have an underlying anxiety disorder    Essential hypertension: Blood pressure trending upward, worse when he gets agitated.  Continue home medications.   Hypokalemia: Replace as  needed.   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[place])  Obesity: Meets criteria BMI greater than 30    Sleep apnea: Would love to try nasal CPAP, but unable if he will tolerate   GERD (gastroesophageal reflux disease): Continue PPI   Code Status: Full code  Family Communication: Wife at the bedside  Disposition Plan: Home once a bit more stable and improved   Consultants: None  Procedures: None  Antimicrobials: None  DVT prophylaxis: Already on Xarelto  Level of care: Med-Surg   Objective: Vitals:   07/09/20 0512 07/09/20 1410  BP: 139/73 (!) 175/81  Pulse: 70 70  Resp: 18 20  Temp: 97.8 F (36.6 C) 98.6 F (37 C)  SpO2: 98% 98%    Intake/Output Summary (Last 24 hours) at 07/09/2020 1639 Last data filed at 07/09/2020 1411 Gross per 24 hour  Intake 1240 ml  Output 1300 ml  Net -60 ml   Filed Weights   07/06/20 1534 07/07/20 1604  Weight: 119.3 kg 118.1 kg   Body mass index is 35.31 kg/m.  Exam:  General: Alert and oriented x2, somewhat anxious HEENT: Normocephalic and atraumatic, mucous membranes are moist Cardiovascular: Regular rate and rhythm, S1-S2 Respiratory: Clear to auscultation bilaterally Abdomen: Soft, obese, nontender, normoactive bowel sounds Musculoskeletal: No clubbing or cyanosis or edema Skin: No skin breaks, tears or lesions Psychiatry: Appropriate currently, but somewhat anxious.  At other times is quite confused   Data Reviewed: CBC: Recent Labs  Lab 07/06/20 1642 07/07/20 0430  WBC 7.8 7.1  NEUTROABS 4.0  --   HGB 13.5 13.1  HCT 41.8 40.8  MCV 93.3 93.8  PLT 204 599   Basic Metabolic Panel: Recent  Labs  Lab 07/06/20 1642 07/07/20 0430  NA 139 137  K 4.0 3.7  CL 105 104  CO2 26 26  GLUCOSE 113* 107*  BUN 12 13  CREATININE 0.91 0.92  CALCIUM 9.0 9.1   GFR: Estimated Creatinine Clearance: 97.7 mL/min (by C-G formula based on SCr of 0.92 mg/dL). Liver Function Tests: Recent Labs  Lab 07/06/20 1642  AST 17  ALT 21  ALKPHOS 81   BILITOT 0.3  PROT 6.6  ALBUMIN 3.7   No results for input(s): LIPASE, AMYLASE in the last 168 hours. No results for input(s): AMMONIA in the last 168 hours. Coagulation Profile: No results for input(s): INR, PROTIME in the last 168 hours. Cardiac Enzymes: No results for input(s): CKTOTAL, CKMB, CKMBINDEX, TROPONINI in the last 168 hours. BNP (last 3 results) No results for input(s): PROBNP in the last 8760 hours. HbA1C: Recent Labs    07/07/20 0430  HGBA1C 6.0*   CBG: No results for input(s): GLUCAP in the last 168 hours. Lipid Profile: No results for input(s): CHOL, HDL, LDLCALC, TRIG, CHOLHDL, LDLDIRECT in the last 72 hours. Thyroid Function Tests: Recent Labs    07/07/20 0430  TSH 2.345   Anemia Panel: Recent Labs    07/07/20 0430  VITAMINB12 179*   Urine analysis:    Component Value Date/Time   COLORURINE YELLOW 07/06/2020 1642   APPEARANCEUR CLEAR 07/06/2020 1642   LABSPEC 1.027 07/06/2020 1642   PHURINE 5.0 07/06/2020 1642   GLUCOSEU NEGATIVE 07/06/2020 1642   HGBUR NEGATIVE 07/06/2020 1642   BILIRUBINUR NEGATIVE 07/06/2020 1642   KETONESUR 5 (A) 07/06/2020 1642   PROTEINUR NEGATIVE 07/06/2020 1642   UROBILINOGEN 0.2 04/04/2011 2241   NITRITE NEGATIVE 07/06/2020 1642   LEUKOCYTESUR NEGATIVE 07/06/2020 1642   Sepsis Labs: @LABRCNTIP (procalcitonin:4,lacticidven:4)  ) Recent Results (from the past 240 hour(s))  Urine culture     Status: Abnormal   Collection Time: 07/06/20  4:42 PM   Specimen: Urine, Clean Catch  Result Value Ref Range Status   Specimen Description   Final    URINE, CLEAN CATCH Performed at [facility], [address], [place], [place] [address]    Special Requests   Final    NONE Performed at [facility], [address] [place], [place] [address]    Culture (A)  Final    30,000 COLONIES/mL MULTIPLE SPECIES PRESENT, SUGGEST RECOLLECTION   Report Status 07/08/2020 FINAL  Final  SARS CORONAVIRUS 2 (TAT 6-24 HRS) Nasopharyngeal  Nasopharyngeal Swab     Status: None   Collection Time: 07/06/20  9:15 PM   Specimen: Nasopharyngeal Swab  Result Value Ref Range Status   SARS Coronavirus 2 NEGATIVE NEGATIVE Final    Comment: (NOTE) SARS-CoV-2 target nucleic acids are NOT DETECTED.  The SARS-CoV-2 RNA is generally detectable in upper and lower respiratory specimens during the acute phase of infection. Negative results do not preclude SARS-CoV-2 infection, do not rule out co-infections with other pathogens, and should not be used as the sole basis for treatment or other patient management decisions. Negative results must be combined with clinical observations, patient history, and epidemiological information. The expected result is Negative.  Fact Sheet for Patients: SugarRoll.be  Fact Sheet for Healthcare Providers: https://www.woods-mathews.com/  This test is not yet approved or cleared by the [place] FDA and  has been authorized for detection and/or diagnosis of SARS-CoV-2 by FDA under an Emergency Use Authorization (EUA). This EUA will remain  in effect (meaning this test can be used) for the  duration of the COVID-19 declaration under Se ction 564(b)(1) of the Act, 21 U.S.C. section 360bbb-3(b)(1), unless the authorization is terminated or revoked sooner.  Performed at [facility], [place] [address] [place], [place] [address]       Studies: No results found.  Scheduled Meds:  cyanocobalamin  1,000 mcg Subcutaneous Daily   hydrocortisone cream  1 application Topical BID   pantoprazole  40 mg Oral Daily   propranolol  20 mg Oral TID   QUEtiapine  25 mg Oral QHS   rivaroxaban  20 mg Oral Q supper   senna  1 tablet Oral QHS   [START ON 07/10/2020] senna-docusate  2 tablet Oral Daily   tamsulosin  0.8 mg Oral q morning    Continuous Infusions:   LOS: 3 days     Annita Brod, MD [facility]   07/09/2020, 4:39 PM

## 2020-07-09 NOTE — Evaluation (Signed)
Physical Therapy Evaluation Patient Details Name: Anthony Downs MRN: [id number] DOB: 1951-01-03 Today's Date: 07/09/2020   History of Present Illness  Anthony Downs is a 70 y.o. male who presents with AMS. Since the patient's craniectomy, the patient has had postoperative aphsia, right-sided weakness. MRI head without acute findings. PMH: hypertension, GERD, OSA, parasagittal meningioma s/p biparietal craniectomy with resection of meningioma 04/29/20  Clinical Impression  Pt admitted with above diagnosis. Pt currently active with HH PT, OT and speech, wife providing total assist, transferring pt to electric w/c with STEDY. Pt currently requiring mod A with  bed mobility, min A with STEDY for transfers for R sided positioning. Pt up in chair reports BM and requests to return to supine for cleaning; spouse at bedside and RN notified. Educated nursing on using STEDY for transfers. Spouse's goal is to return home with nighttime assistance and resume HH therapies. Pt currently with functional limitations due to the deficits listed below (see PT Problem List). Pt will benefit from skilled PT to increase their independence and safety with mobility to allow discharge to the venue listed below.       Follow Up Recommendations Supervision/Assistance - 24 hour;Home health PT    Equipment Recommendations  None recommended by PT    Recommendations for Other Services       Precautions / Restrictions Precautions Precautions: Fall Precaution Comments: R hemiparesis Restrictions Weight Bearing Restrictions: No      Mobility  Bed Mobility Overal bed mobility: Needs Assistance Bed Mobility: Supine to Sit;Sit to Supine  Supine to sit: Mod assist Sit to supine: Mod assist   General bed mobility comments: mod A to assist BLE off of bed and upright trunk, VCs for sequencing, mod A to lift BLE back into bed into supine    Transfers Overall transfer level: Needs assistance   Transfers: Sit  to/from Stand Sit to Stand: Min assist  General transfer comment: min A to place R foot on foot plate and assist R hand to grab bar, min guard to power up to stand using stedy  Ambulation/Gait  General Gait Details: electric w/c at home  Stairs            Wheelchair Mobility    Modified Rankin (Stroke Patients Only)       Balance Overall balance assessment: Needs assistance Sitting-balance support: Feet supported;Single extremity supported Sitting balance-Leahy Scale: Fair Sitting balance - Comments: seated EOB       Pertinent Vitals/Pain Pain Assessment: No/denies pain Faces Pain Scale: No hurt    Home Living Family/patient expects to be discharged to:: Private residence Living Arrangements: Spouse/significant other Available Help at Discharge: Family;Available 24 hours/day Type of Home: House Home Access: Ramped entrance     Home Layout: One level Home Equipment: Bedside commode;Shower seat;Wheelchair - power;Hospital bed;Other (comment) (STEDY lift)      Prior Function Level of Independence: Needs assistance   Gait / Transfers Assistance Needed: pt hasn't ambulated since rehab, STEDY transfers to electric chair  ADL's / Homemaking Assistance Needed: total assist from spouse, bed level        Hand Dominance   Dominant Hand: Right    Extremity/Trunk Assessment        Lower Extremity Assessment Lower Extremity Assessment: RLE deficits/detail;LLE deficits/detail RLE Deficits / Details: MMT 0/5 throughout, muscle activation noted with STS transfer using STEDY RLE Coordination: decreased fine motor;decreased gross motor LLE Deficits / Details: AROM ~75%, strength grossly 3+/5    Cervical / Trunk Assessment Cervical /  Trunk Assessment: Normal  Communication   Communication: Expressive difficulties;Receptive difficulties  Cognition Arousal/Alertness: Awake/alert Behavior During Therapy: WFL for tasks assessed/performed Overall Cognitive Status:  Impaired/Different from baseline  General Comments: Pt's spouse at bedside assisting with cues      General Comments      Exercises     Assessment/Plan    PT Assessment Patient needs continued PT services  PT Problem List Decreased strength;Decreased range of motion;Decreased activity tolerance;Decreased balance;Decreased mobility;Decreased coordination;Decreased cognition;Decreased knowledge of use of DME;Decreased safety awareness       PT Treatment Interventions DME instruction;Functional mobility training;Therapeutic activities;Therapeutic exercise;Balance training;Neuromuscular re-education;Cognitive remediation;Patient/family education    PT Goals (Current goals can be found in the Care Plan section)  Acute Rehab PT Goals Patient Stated Goal: return home with assist PT Goal Formulation: With patient/family Time For Goal Achievement: 07/23/20 Potential to Achieve Goals: Good    Frequency Min 2X/week   Barriers to discharge        Co-evaluation               AM-PAC PT "6 Clicks" Mobility  Outcome Measure Help needed turning from your back to your side while in a flat bed without using bedrails?: Total Help needed moving from lying on your back to sitting on the side of a flat bed without using bedrails?: Total Help needed moving to and from a bed to a chair (including a wheelchair)?: Total Help needed standing up from a chair using your arms (e.g., wheelchair or bedside chair)?: Total Help needed to walk in hospital room?: Total Help needed climbing 3-5 steps with a railing? : Total 6 Click Score: 6    End of Session Equipment Utilized During Treatment: Gait belt Activity Tolerance: Patient tolerated treatment well Patient left: in bed;with call bell/phone within reach;with family/visitor present (pt had BM, assisted back to supine) Nurse Communication: Mobility status;Other (comment) (pt had BM) PT Visit Diagnosis: Other abnormalities of gait and mobility  (R26.89);Muscle weakness (generalized) (M62.81);Other symptoms and signs involving the nervous system (R29.898)    Time: 6761-9509 PT Time Calculation (min) (ACUTE ONLY): 29 min   Charges:   PT Evaluation $PT Eval Moderate Complexity: 1 Mod PT Treatments $Therapeutic Activity: 8-22 mins         Talbot Grumbling PT, DPT 07/09/20, 12:36 PM

## 2020-07-09 NOTE — Plan of Care (Signed)
  Problem: Acute Rehab PT Goals(only PT should resolve) Goal: Pt Will Go Supine/Side To Sit Outcome: Progressing Flowsheets (Taken 07/09/2020 1240) Pt will go Supine/Side to Sit: with minimal assist Goal: Pt Will Go Sit To Supine/Side Outcome: Progressing Flowsheets (Taken 07/09/2020 1240) Pt will go Sit to Supine/Side: with minimal assist Goal: Pt Will Transfer Bed To Chair/Chair To Bed Outcome: Progressing Flowsheets (Taken 07/09/2020 1240) Pt will Transfer Bed to Chair/Chair to Bed: (with STEDY) min guard assist   Tori Hunter Pinkard PT, DPT 07/09/20, 12:41 PM

## 2020-07-10 LAB — COMPREHENSIVE METABOLIC PANEL
ALT: 23 U/L (ref 0–44)
AST: 17 U/L (ref 15–41)
Albumin: 3.4 g/dL — ABNORMAL LOW (ref 3.5–5.0)
Alkaline Phosphatase: 75 U/L (ref 38–126)
Anion gap: 7 (ref 5–15)
BUN: 7 mg/dL — ABNORMAL LOW (ref 8–23)
CO2: 26 mmol/L (ref 22–32)
Calcium: 8.8 mg/dL — ABNORMAL LOW (ref 8.9–10.3)
Chloride: 104 mmol/L (ref 98–111)
Creatinine, Ser: 0.84 mg/dL (ref 0.61–1.24)
GFR, Estimated: >60 mL/min (ref >60)
Glucose, Bld: 107 mg/dL — ABNORMAL HIGH (ref 70–99)
Potassium: 3.8 mmol/L (ref 3.5–5.1)
Sodium: 137 mmol/L (ref 135–145)
Total Bilirubin: 0.5 mg/dL (ref 0.3–1.2)
Total Protein: 5.8 g/dL — ABNORMAL LOW (ref 6.5–8.1)

## 2020-07-10 LAB — CBC
HCT: 41.1 % (ref 39.0–52.0)
Hemoglobin: 12.9 g/dL — ABNORMAL LOW (ref 13.0–17.0)
MCH: 29.6 pg (ref 26.0–34.0)
MCHC: 31.4 g/dL (ref 30.0–36.0)
MCV: 94.3 fL (ref 80.0–100.0)
Platelets: 207 10*3/uL (ref 150–400)
RBC: 4.36 MIL/uL (ref 4.22–5.81)
RDW: 13.1 % (ref 11.5–15.5)
WBC: 6.9 10*3/uL (ref 4.0–10.5)
nRBC: 0 % (ref 0.0–0.2)

## 2020-07-10 LAB — BLOOD GAS, ARTERIAL
Acid-Base Excess: 2.3 mmol/L — ABNORMAL HIGH (ref 0.0–2.0)
Bicarbonate: 26.2 mmol/L (ref 20.0–28.0)
FIO2: 21
O2 Saturation: 95.6 %
Patient temperature: 36.6
pCO2 arterial: 42 mmHg (ref 32.0–48.0)
pH, Arterial: 7.415 (ref 7.350–7.450)
pO2, Arterial: 76.4 mmHg — ABNORMAL LOW (ref 83.0–108.0)

## 2020-07-10 LAB — PROCALCITONIN: Procalcitonin: <0.1 ng/mL

## 2020-07-10 LAB — AMMONIA: Ammonia: 23 umol/L (ref 9–35)

## 2020-07-10 MED ORDER — ACETAMINOPHEN 325 MG PO TABS
650.0000 mg | ORAL_TABLET | Freq: Four times a day (QID) | ORAL | Status: DC | PRN
  Administered 2020-07-10 – 2020-07-14 (×2): 650 mg via ORAL
  Filled 2020-07-10 (×2): qty 2

## 2020-07-10 NOTE — Progress Notes (Signed)
Left note via snap chat about patient concerns.

## 2020-07-10 NOTE — Progress Notes (Addendum)
Patient has been agitated and combative with care He has has made multiple attempts to climb out of bed with near misses for falls. He has pulled out his IV and has pulled off his external catheter x4. on call provider paged via amion and informed about above behavior. At the completion of this note wrist restrains have been initiated. I will continue to monitor patient closely has the shift ensues.

## 2020-07-10 NOTE — Progress Notes (Signed)
Spoke with patients wife Anthony Downs informing her that wrist restraints has been initiated. I explained that the patients actions were affecting his safety and care/ she is agreeable with plan of care. She is encouraged to visit with patient as she believes sitting at his bed side may help reduce his current level of agitation.

## 2020-07-10 NOTE — Progress Notes (Signed)
PROGRESS NOTE  Anthony Downs WHQ:[id number] DOB: 1950-08-26 DOA: 07/06/2020 PCP: Leeanne Rio, MD  HPI/Recap of past 69 hours: 70 year old with past medical history of hypertension, obesity, obstructive sleep apnea not on CPAP and parasagittal meningioma status post biparietal craniotomy with resection of meningioma 2 months ago presented to the emergency room on 6/19 with confusion.  Since patient surgery and recovery in inpatient rehab, he has had issues with aphasia, right upper extremity weakness and left lower extremity weakness plus some confusion, worse at night.  Symptoms have continued to progress so patient brought into the emergency room.  CT scan of head unremarkable and patient admitted to the hospitalist service.  MRI unremarkable as well.  Noted to have some B12 deficiency which has been restarted.  Symptoms felt to be from B12 deficiency and/or sleep deprivation.  No signs of infection.  Initially patient started to have some improvement, but at nights when he sundown's, things get worse.  Patient had another bad night.  Confused, requiring restraints.  At night, Haldol or Ativan those little.  As of late morning, he is much more calm and interactive, appropriate.  Lab work unrevealing.  Assessment/Plan: Active Problems: Acute metabolic encephalopathy in patient with history of meningioma ([place]) status post resection: Definitely having sundowning and this plus B12 deficiency may be because of some issues.  He also has a history of sleep apnea not on any CPAP because unable to tolerate which may be leading to some chronic hypoxia as well.  ABG really unrevealing.  CT and MRI unremarkable.  No evidence of any electrolyte abnormalities, thyroid dysfunction, medication reaction, medication withdrawal, hepatic encephalopathy, infection or hypoxia.  No evidence of CVA.  Have asked neurology for consultation.  At family's request, also had put in for transfer to [facility] for  additional evaluation.  I question if this is more from sundowning from encephalomalacia associated dementia?  Have asked for family's assistance to help keep him awake during the daytime.     Essential hypertension: Blood pressure trending upward, worse when he gets agitated.  Continue home medications.   Hypokalemia: Replace as needed.   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    GERD (gastroesophageal reflux disease): Continue PPI   Code Status: Full code  Family Communication: Wife at the bedside  Disposition Plan: Transferring to [facility] for [facility] work-up   Consultants: Neurology  Procedures: None  Antimicrobials: None  DVT prophylaxis: Already on Xarelto  Level of care: Med-Surg   Objective: Vitals:   07/09/20 2024 07/10/20 0542  BP: (!) 163/89 (!) 145/67  Pulse: 69 (!) 52  Resp: 18 18  Temp: 97.8 F (36.6 C) 97.9 F (36.6 C)  SpO2: 98% 92%    Intake/Output Summary (Last 24 hours) at 07/10/2020 1224 Last data filed at 07/10/2020 1036 Gross per 24 hour  Intake 720 ml  Output 450 ml  Net 270 ml    Filed Weights   07/06/20 1534 07/07/20 1604  Weight: 119.3 kg 118.1 kg   Body mass index is 35.31 kg/m.  Exam:  General: Alert and oriented x2, less anxious today, more calm HEENT: Normocephalic and atraumatic, mucous membranes are moist Cardiovascular: Regular rate and rhythm, S1-S2 Respiratory: Clear to auscultation bilaterally Abdomen: Soft, obese, nontender, normoactive bowel sounds Musculoskeletal: No clubbing or cyanosis or edema Skin: No skin breaks, tears or lesions Psychiatry: Appropriate currently, other times more confused, especially  at night   Data Reviewed: CBC: Recent Labs  Lab 07/06/20 1642 07/07/20 0430 07/10/20 1057  WBC 7.8 7.1 6.9  NEUTROABS 4.0  --   --   HGB 13.5 13.1 12.9*  HCT 41.8 40.8 41.1  MCV 93.3 93.8 94.3  PLT 204 183 207     Basic Metabolic Panel: Recent Labs  Lab 07/06/20 1642 07/07/20 0430 07/10/20 1057  NA 139 137 137  K 4.0 3.7 3.8  CL 105 104 104  CO2 26 26 26   GLUCOSE 113* 107* 107*  BUN 12 13 7*  CREATININE 0.91 0.92 0.84  CALCIUM 9.0 9.1 8.8*    GFR: Estimated Creatinine Clearance: 107 mL/min (by C-G formula based on SCr of 0.84 mg/dL). Liver Function Tests: Recent Labs  Lab 07/06/20 1642 07/10/20 1057  AST 17 17  ALT 21 23  ALKPHOS 81 75  BILITOT 0.3 0.5  PROT 6.6 5.8*  ALBUMIN 3.7 3.4*    No results for input(s): LIPASE, AMYLASE in the last 168 hours. Recent Labs  Lab 07/10/20 1057  AMMONIA 23   Coagulation Profile: No results for input(s): INR, PROTIME in the last 168 hours. Cardiac Enzymes: No results for input(s): CKTOTAL, CKMB, CKMBINDEX, TROPONINI in the last 168 hours. BNP (last 3 results) No results for input(s): PROBNP in the last 8760 hours. HbA1C: No results for input(s): HGBA1C in the last 72 hours.  CBG: No results for input(s): GLUCAP in the last 168 hours. Lipid Profile: No results for input(s): CHOL, HDL, LDLCALC, TRIG, CHOLHDL, LDLDIRECT in the last 72 hours. Thyroid Function Tests: No results for input(s): TSH, T4TOTAL, FREET4, T3FREE, THYROIDAB in the last 72 hours.  Anemia Panel: No results for input(s): VITAMINB12, FOLATE, FERRITIN, TIBC, IRON, RETICCTPCT in the last 72 hours.  Urine analysis:    Component Value Date/Time   COLORURINE YELLOW 07/06/2020 1642   APPEARANCEUR CLEAR 07/06/2020 1642   LABSPEC 1.027 07/06/2020 1642   PHURINE 5.0 07/06/2020 1642   GLUCOSEU NEGATIVE 07/06/2020 1642   HGBUR NEGATIVE 07/06/2020 1642   BILIRUBINUR NEGATIVE 07/06/2020 1642   KETONESUR 5 (A) 07/06/2020 1642   PROTEINUR NEGATIVE 07/06/2020 1642   UROBILINOGEN 0.2 04/04/2011 2241   NITRITE NEGATIVE 07/06/2020 1642   LEUKOCYTESUR NEGATIVE 07/06/2020 1642   Sepsis Labs: @LABRCNTIP (procalcitonin:4,lacticidven:4)  ) Recent Results (from the past  240 hour(s))  Urine culture     Status: Abnormal   Collection Time: 07/06/20  4:42 PM   Specimen: Urine, Clean Catch  Result Value Ref Range Status   Specimen Description   Final    URINE, CLEAN CATCH Performed at [facility], [address] [place], [place] [address]    Special Requests   Final    NONE Performed at [facility], [address] [place], [place] [address]    Culture (A)  Final    30,000 COLONIES/mL MULTIPLE SPECIES PRESENT, SUGGEST RECOLLECTION   Report Status 07/08/2020 FINAL  Final  SARS CORONAVIRUS 2 (TAT 6-24 HRS) Nasopharyngeal Nasopharyngeal Swab     Status: None   Collection Time: 07/06/20  9:15 PM   Specimen: Nasopharyngeal Swab  Result Value Ref Range Status   SARS Coronavirus 2 NEGATIVE NEGATIVE Final    Comment: (NOTE) SARS-CoV-2 target nucleic acids are NOT DETECTED.  The SARS-CoV-2 RNA is generally detectable in upper and lower respiratory specimens during the acute phase of infection. Negative results do not preclude SARS-CoV-2 infection, do not rule out co-infections with other pathogens, and should not be used as the sole basis for  treatment or other patient management decisions. Negative results must be combined with clinical observations, patient history, and epidemiological information. The expected result is Negative.  Fact Sheet for Patients: SugarRoll.be  Fact Sheet for Healthcare Providers: https://www.woods-mathews.com/  This test is not yet approved or cleared by the [place] FDA and  has been authorized for detection and/or diagnosis of SARS-CoV-2 by FDA under an Emergency Use Authorization (EUA). This EUA will remain  in effect (meaning this test can be used) for the duration of the COVID-19 declaration under Se ction 564(b)(1) of the Act, 21 U.S.C. section 360bbb-3(b)(1), unless the authorization is terminated or revoked sooner.  Performed at [facility], [place] [address] [place], [place] [address]       Studies: No results found.  Scheduled Meds:  cyanocobalamin  1,000 mcg Subcutaneous Daily   hydrocortisone cream  1 application Topical BID   pantoprazole  40 mg Oral Daily   propranolol  20 mg Oral TID   QUEtiapine  25 mg Oral QHS   rivaroxaban  20 mg Oral Q supper   senna  1 tablet Oral QHS   senna-docusate  2 tablet Oral Daily   tamsulosin  0.8 mg Oral q morning   traZODone  50 mg Oral QHS    Continuous Infusions:   LOS: 4 days     Annita Brod, MD [facility]   07/10/2020, 12:24 PM

## 2020-07-10 NOTE — Progress Notes (Signed)
Patients wife is at the bedside. She has requested that restraints be removed at this time. Restraints have promptly been removed on request. She wishes to speak to the treating provider to discuss " why there is no noted improvement to patients  baseline admission status" She wishes discuss possible transfer to  the [facility].  She is also requesting that Antivan/haldol to be stopped. I will report caregiver wishes this AM to oncoming primary nurse.

## 2020-07-10 NOTE — Progress Notes (Signed)
Went in to talk to patient about CPAP.  Patient seems to think that the surgery he had on his sinuses cured him of sleep apnea.  I tried to explain to patient that I understood that they took out growths and cleared his sinuses but that doesn't quite mean that he did not still have sleep apnea.  Patient is still refusing, but I told him that the best thing he could do was have a talk with his provider about getting another study done to see if he was better after the surgery, and that if he still had it, he needed to work towards using one at home.  Patient stated he understood and was going to talk to his provider about this.  He also stated that he wants to get through this hospital admission and he would work on the CPAP issue.

## 2020-07-10 NOTE — Progress Notes (Signed)
Patient's wife at bedside this morning. Patient relaxed and asking for something to drink. Restraints discontinued at this time.

## 2020-07-10 NOTE — Progress Notes (Signed)
Patient is alert, but non complaint with care and with safety measures. Staff has tried several times to redirect patient, but patient continues to be impulsive and interfere with care.

## 2020-07-11 MED ORDER — TRAZODONE HCL 50 MG PO TABS
100.0000 mg | ORAL_TABLET | Freq: Every day | ORAL | Status: DC
  Administered 2020-07-11 – 2020-07-12 (×2): 100 mg via ORAL
  Filled 2020-07-11 (×2): qty 2

## 2020-07-11 NOTE — Progress Notes (Signed)
Pt arrived to [facility] via [facility]. EKG done showing frequent PVCs, copy placed on chart. Pt placed on tele. Call bell in reach. Pt in no distress.

## 2020-07-11 NOTE — Progress Notes (Signed)
RN receiving pt from [facility]. 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 Dr. [place] Pink made aware. Will place pt on telemetry and obtain EKG when pt arrives to unit.

## 2020-07-11 NOTE — Plan of Care (Signed)
  Problem: Education: Goal: Knowledge of General Education information will improve Description: Including pain rating scale, medication(s)/side effects and non-pharmacologic comfort measures Outcome: Not Progressing   Problem: Health Behavior/Discharge Planning: Goal: Ability to manage health-related needs will improve Outcome: Not Progressing   Problem: Clinical Measurements: Goal: Ability to maintain clinical measurements within normal limits will improve Outcome: Progressing Goal: Will remain free from infection Outcome: Progressing Goal: Diagnostic test results will improve Outcome: Progressing Goal: Respiratory complications will improve Outcome: Progressing Goal: Cardiovascular complication will be avoided Outcome: Progressing   Problem: Activity: Goal: Risk for activity intolerance will decrease Outcome: Not Progressing   Problem: Nutrition: Goal: Adequate nutrition will be maintained Outcome: Progressing   Problem: Coping: Goal: Level of anxiety will decrease Outcome: Not Progressing   Problem: Elimination: Goal: Will not experience complications related to bowel motility Outcome: Progressing Goal: Will not experience complications related to urinary retention Outcome: Progressing   Problem: Pain Managment: Goal: General experience of comfort will improve Outcome: Progressing   Problem: Safety: Goal: Ability to remain free from injury will improve Outcome: Progressing   Problem: Skin Integrity: Goal: Risk for impaired skin integrity will decrease Outcome: Progressing   Problem: Safety: Goal: Non-violent Restraint(s) Outcome: Not Applicable

## 2020-07-11 NOTE — Progress Notes (Addendum)
PROGRESS NOTE  Anthony Downs HER:[id number] DOB: 1950-12-21 DOA: 07/06/2020 PCP: Leeanne Rio, MD  HPI/Recap of past 65 hours: 70 year old with past medical history of hypertension, obesity, obstructive sleep apnea not on CPAP and parasagittal meningioma status post biparietal craniotomy with resection of meningioma 2 months ago presented to the emergency room on 6/19 with confusion.  Since patient surgery and recovery in inpatient rehab, he has had issues with aphasia, right upper extremity weakness and left lower extremity weakness plus some confusion, worse at night.  Symptoms have continued to progress so patient brought into the emergency room.  CT scan of head unremarkable and patient admitted to the hospitalist service.  MRI unremarkable as well.  Noted to have some B12 deficiency which has been restarted.  Symptoms felt to be from B12 deficiency and/or sleep deprivation.  No signs of infection.  Initially patient started to have some improvement, but at nights when he sundown's, things get worse.  Patient had better night.  Slept some part of it.  More awake and alert today.  Assessment/Plan: Active Problems: Acute metabolic encephalopathy in patient with history of meningioma ([place]) status post resection: Definitely having sundowning and this plus B12 deficiency may be because of some issues.  He also has a history of sleep apnea not on any CPAP because unable to tolerate which may be leading to some chronic hypoxia as well.  ABG really unrevealing.  CT and MRI unremarkable.  No evidence of any electrolyte abnormalities, thyroid dysfunction, medication reaction, medication withdrawal, hepatic encephalopathy, infection or hypoxia.  No evidence of CVA.  Have asked neurology for consultation.  At family's request, also had put in for transfer to [facility] for additional evaluation.  I question if this is more from sundowning from encephalomalacia associated dementia?  Have asked for  family's assistance to help keep him awake during the daytime.  This seems to have helped as he slept better last night along with getting trazodone.  We will increase trazodone dose and have family continue to try to keep patient up during the day.  He refused nasal CPAP     Essential hypertension: Blood pressure trending upward, worse when he gets agitated.  Continue home medications.   Hypokalemia: Replace as needed.   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[place])  Obesity: Meets criteria BMI greater than 30    Sleep apnea: Refused nasal CPAP    GERD (gastroesophageal reflux disease): Continue PPI   Code Status: Full code  Family Communication: Wife at the bedside  Disposition Plan: Transferring to [facility] for further work-up   Consultants: Neurology  Procedures: None  Antimicrobials: None  DVT prophylaxis: Already on Xarelto  Level of care: Med-Surg   Objective: Vitals:   07/11/20 0447 07/11/20 1448  BP: 117/66 132/71  Pulse: 61 72  Resp: 18   Temp: 98.2 F (36.8 C) 98.2 F (36.8 C)  SpO2: 97% 98%    Intake/Output Summary (Last 24 hours) at 07/11/2020 1529 Last data filed at 07/11/2020 0900 Gross per 24 hour  Intake 920 ml  Output 875 ml  Net 45 ml    Filed Weights   07/06/20 1534 07/07/20 1604  Weight: 119.3 kg 118.1 kg   Body mass index is 35.31 kg/m.  Exam:  General: Alert and oriented x2, l somewhat less anxious today. HEENT: Normocephalic and atraumatic, mucous membranes are moist Cardiovascular: Regular rate and rhythm, S1-S2 Respiratory: Clear to auscultation bilaterally Abdomen: Soft, obese, nontender, normoactive bowel sounds Musculoskeletal: No clubbing or cyanosis or edema  Skin: No skin breaks, tears or lesions Psychiatry: Appropriate currently, other times more confused, especially at night   Data Reviewed: CBC: Recent Labs  Lab 07/06/20 1642 07/07/20 0430 07/10/20 1057  WBC 7.8 7.1 6.9  NEUTROABS 4.0  --   --   HGB 13.5 13.1 12.9*  HCT 41.8  40.8 41.1  MCV 93.3 93.8 94.3  PLT 204 183 696    Basic Metabolic Panel: Recent Labs  Lab 07/06/20 1642 07/07/20 0430 07/10/20 1057  NA 139 137 137  K 4.0 3.7 3.8  CL 105 104 104  CO2 26 26 26   GLUCOSE 113* 107* 107*  BUN 12 13 7*  CREATININE 0.91 0.92 0.84  CALCIUM 9.0 9.1 8.8*    GFR: Estimated Creatinine Clearance: 107 mL/min (by C-G formula based on SCr of 0.84 mg/dL). Liver Function Tests: Recent Labs  Lab 07/06/20 1642 07/10/20 1057  AST 17 17  ALT 21 23  ALKPHOS 81 75  BILITOT 0.3 0.5  PROT 6.6 5.8*  ALBUMIN 3.7 3.4*    No results for input(s): LIPASE, AMYLASE in the last 168 hours. Recent Labs  Lab 07/10/20 1057  AMMONIA 23    Coagulation Profile: No results for input(s): INR, PROTIME in the last 168 hours. Cardiac Enzymes: No results for input(s): CKTOTAL, CKMB, CKMBINDEX, TROPONINI in the last 168 hours. BNP (last 3 results) No results for input(s): PROBNP in the last 8760 hours. HbA1C: No results for input(s): HGBA1C in the last 72 hours.  CBG: No results for input(s): GLUCAP in the last 168 hours. Lipid Profile: No results for input(s): CHOL, HDL, LDLCALC, TRIG, CHOLHDL, LDLDIRECT in the last 72 hours. Thyroid Function Tests: No results for input(s): TSH, T4TOTAL, FREET4, T3FREE, THYROIDAB in the last 72 hours.  Anemia Panel: No results for input(s): VITAMINB12, FOLATE, FERRITIN, TIBC, IRON, RETICCTPCT in the last 72 hours.  Urine analysis:    Component Value Date/Time   COLORURINE YELLOW 07/06/2020 1642   APPEARANCEUR CLEAR 07/06/2020 1642   LABSPEC 1.027 07/06/2020 1642   PHURINE 5.0 07/06/2020 1642   GLUCOSEU NEGATIVE 07/06/2020 1642   HGBUR NEGATIVE 07/06/2020 1642   BILIRUBINUR NEGATIVE 07/06/2020 1642   KETONESUR 5 (A) 07/06/2020 1642   PROTEINUR NEGATIVE 07/06/2020 1642   UROBILINOGEN 0.2 04/04/2011 2241   NITRITE NEGATIVE 07/06/2020 1642   LEUKOCYTESUR NEGATIVE 07/06/2020 1642   Sepsis  Labs: @LABRCNTIP (procalcitonin:4,lacticidven:4)  ) Recent Results (from the past 240 hour(s))  Urine culture     Status: Abnormal   Collection Time: 07/06/20  4:42 PM   Specimen: Urine, Clean Catch  Result Value Ref Range Status   Specimen Description   Final    URINE, CLEAN CATCH Performed at [facility], [address] [place], [place] [address]    Special Requests   Final    NONE Performed at [facility], [address] [place], [place] [address]    Culture (A)  Final    30,000 COLONIES/mL MULTIPLE SPECIES PRESENT, SUGGEST RECOLLECTION   Report Status 07/08/2020 FINAL  Final  SARS CORONAVIRUS 2 (TAT 6-24 HRS) Nasopharyngeal Nasopharyngeal Swab     Status: None   Collection Time: 07/06/20  9:15 PM   Specimen: Nasopharyngeal Swab  Result Value Ref Range Status   SARS Coronavirus 2 NEGATIVE NEGATIVE Final    Comment: (NOTE) SARS-CoV-2 target nucleic acids are NOT DETECTED.  The SARS-CoV-2 RNA is generally detectable in upper and lower respiratory specimens during the acute phase of infection. Negative results do not preclude SARS-CoV-2 infection, do not  rule out co-infections with other pathogens, and should not be used as the sole basis for treatment or other patient management decisions. Negative results must be combined with clinical observations, patient history, and epidemiological information. The expected result is Negative.  Fact Sheet for Patients: SugarRoll.be  Fact Sheet for Healthcare Providers: https://www.woods-mathews.com/  This test is not yet approved or cleared by the [place] FDA and  has been authorized for detection and/or diagnosis of SARS-CoV-2 by FDA under an Emergency Use Authorization (EUA). This EUA will remain  in effect (meaning this test can be used) for the duration of the COVID-19 declaration under Se ction 564(b)(1) of the Act, 21 U.S.C. section 360bbb-3(b)(1), unless the authorization is  terminated or revoked sooner.  Performed at [facility], [place] [address], [place], [place] [address]       Studies: No results found.  Scheduled Meds:  cyanocobalamin  1,000 mcg Subcutaneous Daily   hydrocortisone cream  1 application Topical BID   pantoprazole  40 mg Oral Daily   propranolol  20 mg Oral TID   QUEtiapine  25 mg Oral QHS   rivaroxaban  20 mg Oral Q supper   senna  1 tablet Oral QHS   senna-docusate  2 tablet Oral Daily   tamsulosin  0.8 mg Oral q morning   traZODone  100 mg Oral QHS    Continuous Infusions:   LOS: 5 days     Annita Brod, MD [facility]   07/11/2020, 3:29 PM

## 2020-07-12 MED ORDER — QUETIAPINE FUMARATE 25 MG PO TABS
50.0000 mg | ORAL_TABLET | Freq: Every day | ORAL | Status: DC
  Administered 2020-07-12: 50 mg via ORAL
  Filled 2020-07-12: qty 2

## 2020-07-12 NOTE — Progress Notes (Signed)
RT went to talk to pt about CPAP, pt refused CPAP and stated he had discussed this with his provider. RT will continue to monitor.

## 2020-07-12 NOTE — Progress Notes (Signed)
PROGRESS NOTE  Anthony Downs XKG:[id number] DOB: 10/13/1950 DOA: 07/06/2020 PCP: Leeanne Rio, MD  HPI/Recap of past 79 hours: 70 year old with past medical history of hypertension, obesity, obstructive sleep apnea not on CPAP and parasagittal meningioma status post biparietal craniotomy with resection of meningioma 2 months ago presented to the emergency room on 6/19 with confusion.  Since patient surgery and recovery in inpatient rehab, he has had issues with aphasia, right upper extremity weakness and left lower extremity weakness plus some confusion, worse at night.  Symptoms have continued to progress so patient brought into the emergency room.  CT scan of head unremarkable and patient admitted to the hospitalist service.  MRI unremarkable as well.  Noted to have some B12 deficiency which has been restarted.  Symptoms felt to be from B12 deficiency and/or sleep deprivation.  No signs of infection.  Initially patient started to have some improvement, but at nights when he sundown's, things get worse.  Patient did well night of 6/23.  Transferred to [facility] evening of 6/24 and had very rough night, slept very little despite increased trazodone.  This morning, tired, but alert and interactive.  No complaints.  Neurology to see.  Assessment/Plan: Active Problems: Acute metabolic encephalopathy in patient with history of meningioma ([place]) status post resection: Definitely having sundowning and this plus B12 deficiency may be because of some issues.  He also has a history of sleep apnea not on any CPAP because unable to tolerate which may be leading to some chronic hypoxia as well.  ABG really unrevealing.  CT and MRI unremarkable.  No evidence of any electrolyte abnormalities, thyroid dysfunction, medication reaction, medication withdrawal, hepatic encephalopathy, infection or hypoxia.  No evidence of CVA.   At family's request, also had put in for transfer to [facility] for additional  evaluation and neurology to see.  I question if this is more from sundowning from encephalomalacia associated dementia?  Have asked for family's assistance to help keep him awake during the daytime.  Tried increasing trazodone, but new setting of transfer to [facility] he may have caused him to be more confused last night     Essential hypertension: Blood pressure at times trending upward, worse when he gets agitated.  Continue home medications.    Hypokalemia: Replace as needed.   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   Consultants: Neurology  Procedures: None  Antimicrobials: None  DVT prophylaxis: Already on Xarelto  Level of care: Med-Surg   Objective: Vitals:   07/11/20 2204 07/12/20 0700  BP: (!) 113/56 140/77  Pulse: 75 81  Resp:  18  Temp:  (!) 97.4 F (36.3 C)  SpO2:  97%    Intake/Output Summary (Last 24 hours) at 07/12/2020 0856 Last data filed at 07/11/2020 1700 Gross per 24 hour  Intake 2040 ml  Output --  Net 2040 ml    Filed Weights   07/06/20 1534 07/07/20 1604  Weight: 119.3 kg 118.1 kg   Body mass index is 35.31 kg/m.  Exam:  General: Alert and oriented x2, fatigued, but appropriate HEENT: Normocephalic and atraumatic, mucous membranes are moist Cardiovascular: Regular rate and rhythm, S1-S2 Respiratory: Clear to auscultation bilaterally Abdomen: Soft, obese, nontender, normoactive bowel sounds Musculoskeletal: No clubbing or cyanosis  or edema Skin: No skin breaks, tears or lesions Psychiatry: Appropriate currently, other times more confused, especially at night   Data Reviewed: CBC: Recent Labs  Lab 07/06/20 1642 07/07/20 0430 07/10/20 1057  WBC 7.8 7.1 6.9  NEUTROABS 4.0  --   --    HGB 13.5 13.1 12.9*  HCT 41.8 40.8 41.1  MCV 93.3 93.8 94.3  PLT 204 183 191    Basic Metabolic Panel: Recent Labs  Lab 07/06/20 1642 07/07/20 0430 07/10/20 1057  NA 139 137 137  K 4.0 3.7 3.8  CL 105 104 104  CO2 26 26 26   GLUCOSE 113* 107* 107*  BUN 12 13 7*  CREATININE 0.91 0.92 0.84  CALCIUM 9.0 9.1 8.8*    GFR: Estimated Creatinine Clearance: 107 mL/min (by C-G formula based on SCr of 0.84 mg/dL). Liver Function Tests: Recent Labs  Lab 07/06/20 1642 07/10/20 1057  AST 17 17  ALT 21 23  ALKPHOS 81 75  BILITOT 0.3 0.5  PROT 6.6 5.8*  ALBUMIN 3.7 3.4*    No results for input(s): LIPASE, AMYLASE in the last 168 hours. Recent Labs  Lab 07/10/20 1057  AMMONIA 23    Coagulation Profile: No results for input(s): INR, PROTIME in the last 168 hours. Cardiac Enzymes: No results for input(s): CKTOTAL, CKMB, CKMBINDEX, TROPONINI in the last 168 hours. BNP (last 3 results) No results for input(s): PROBNP in the last 8760 hours. HbA1C: No results for input(s): HGBA1C in the last 72 hours.  CBG: No results for input(s): GLUCAP in the last 168 hours. Lipid Profile: No results for input(s): CHOL, HDL, LDLCALC, TRIG, CHOLHDL, LDLDIRECT in the last 72 hours. Thyroid Function Tests: No results for input(s): TSH, T4TOTAL, FREET4, T3FREE, THYROIDAB in the last 72 hours.  Anemia Panel: No results for input(s): VITAMINB12, FOLATE, FERRITIN, TIBC, IRON, RETICCTPCT in the last 72 hours.  Urine analysis:    Component Value Date/Time   COLORURINE YELLOW 07/06/2020 1642   APPEARANCEUR CLEAR 07/06/2020 1642   LABSPEC 1.027 07/06/2020 1642   PHURINE 5.0 07/06/2020 1642   GLUCOSEU NEGATIVE 07/06/2020 1642   HGBUR NEGATIVE 07/06/2020 1642   BILIRUBINUR NEGATIVE 07/06/2020 1642   KETONESUR 5 (A) 07/06/2020 1642   PROTEINUR NEGATIVE 07/06/2020 1642   UROBILINOGEN 0.2 04/04/2011 2241   NITRITE NEGATIVE 07/06/2020 1642   LEUKOCYTESUR NEGATIVE 07/06/2020 1642   Sepsis  Labs: @LABRCNTIP (procalcitonin:4,lacticidven:4)  ) Recent Results (from the past 240 hour(s))  Urine culture     Status: Abnormal   Collection Time: 07/06/20  4:42 PM   Specimen: Urine, Clean Catch  Result Value Ref Range Status   Specimen Description   Final    URINE, CLEAN CATCH Performed at [facility], [address], [place], [place] [address]    Special Requests   Final    NONE Performed at [facility], [address] [place], [place] [address]    Culture (A)  Final    30,000 COLONIES/mL MULTIPLE SPECIES PRESENT, SUGGEST RECOLLECTION   Report Status 07/08/2020 FINAL  Final  SARS CORONAVIRUS 2 (TAT 6-24 HRS) Nasopharyngeal Nasopharyngeal Swab     Status: None   Collection Time: 07/06/20  9:15 PM   Specimen: Nasopharyngeal Swab  Result Value Ref Range Status   SARS Coronavirus 2 NEGATIVE NEGATIVE Final    Comment: (NOTE) SARS-CoV-2 target nucleic acids are NOT DETECTED.  The SARS-CoV-2 RNA is generally detectable in upper and lower respiratory specimens during the acute phase of infection. Negative results do not preclude SARS-CoV-2 infection,  do not rule out co-infections with other pathogens, and should not be used as the sole basis for treatment or other patient management decisions. Negative results must be combined with clinical observations, patient history, and epidemiological information. The expected result is Negative.  Fact Sheet for Patients: SugarRoll.be  Fact Sheet for Healthcare Providers: https://www.woods-mathews.com/  This test is not yet approved or cleared by the [place] FDA and  has been authorized for detection and/or diagnosis of SARS-CoV-2 by FDA under an Emergency Use Authorization (EUA). This EUA will remain  in effect (meaning this test can be used) for the duration of the COVID-19 declaration under Se ction 564(b)(1) of the Act, 21 U.S.C. section 360bbb-3(b)(1), unless the authorization is  terminated or revoked sooner.  Performed at [facility], [place] [address] [place], [place] [address]       Studies: No results found.  Scheduled Meds:  cyanocobalamin  1,000 mcg Subcutaneous Daily   hydrocortisone cream  1 application Topical BID   pantoprazole  40 mg Oral Daily   propranolol  20 mg Oral TID   QUEtiapine  25 mg Oral QHS   rivaroxaban  20 mg Oral Q supper   senna  1 tablet Oral QHS   senna-docusate  2 tablet Oral Daily   tamsulosin  0.8 mg Oral q morning   traZODone  100 mg Oral QHS    Continuous Infusions:   LOS: 6 days     Annita Brod, MD [facility]   07/12/2020, 8:56 AM
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
## 2020-07-12 NOTE — Progress Notes (Signed)
Occupational Therapy Evaluation Patient Details Name: Anthony Downs MRN: [id number] DOB: Apr 27, 1950 Today's Date: 07/12/2020    History of Present Illness Anthony Downs is a 70 y.o. male who presents with AMS. Since the patient's craniectomy, the patient has had postoperative aphsia, right-sided weakness. MRI head without acute findings. 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 PTA pt required assist for all ADLs, transferred via Steady and mobilized with PWC. Upon evaluation pt was mod A for bed mobility, and min A for sit<>stand with steady. Pt reports he is able to feed himself simple foods and requires his wife's assistance for messy foods. Max A overall for ADLs at this time. Pt would benefit from continued OT acutely to progress function in ADL and mobility. Recommend d/c to home with support of wife and HHOT    Follow Up Recommendations  Home health OT;Supervision/Assistance - 24 hour    Equipment Recommendations  None recommended by OT       Precautions / Restrictions Precautions Precautions: Fall Precaution Comments: R hemiparesis Restrictions Weight Bearing Restrictions: No      Mobility Bed Mobility Overal bed mobility: Needs Assistance Bed Mobility: Sit to Supine;Rolling;Sidelying to Sit Rolling: Mod assist Sidelying to sit: Mod assist   Sit to supine: Mod assist   General bed mobility comments: mod A to assist BLE off of bed and upright trunk, VCs for sequencing, mod A to lift BLE back into bed into supine    Transfers Overall transfer level: Needs assistance Equipment used:  (steady) Transfers: Sit to/from Stand Sit to Stand: Min assist         General transfer comment: min A to place R foot on foot plate, min guard to power up to standing using stedy    Balance Overall balance assessment:  Needs assistance Sitting-balance support: Feet supported;Single extremity supported Sitting balance-Leahy Scale: Fair     Standing balance support: Bilateral upper extremity supported Standing balance-Leahy Scale: Poor Standing balance comment: BUE reliant on external support of steady                           ADL either performed or assessed with clinical judgement   ADL Overall ADL's : Needs assistance/impaired Eating/Feeding: Minimal assistance;Sitting   Grooming: Set up;Sitting   Upper Body Bathing: Moderate assistance;Sitting   Lower Body Bathing: Maximal assistance;Sit to/from stand   Upper Body Dressing : Moderate assistance;Sitting   Lower Body Dressing: Maximal assistance;Sit to/from stand   Toilet Transfer: Total assistance (with steady)   Toileting- Clothing Manipulation and Hygiene: Total assistance;Sit to/from stand       Functional mobility during ADLs: Minimal assistance (with use of steady) General ADL Comments: Pt's wife assits wtih ADLs as needed, reports he is close ot his physical baseline, however is still expressing some confusion and "is not himeslf."     Vision Baseline Vision/History: Wears glasses Wears Glasses: Reading only Patient Visual Report: No change from baseline              Pertinent Vitals/Pain Pain Assessment: Faces Faces Pain Scale: Hurts a little bit Pain Location: generalized with movement Pain Descriptors / Indicators: Discomfort;Grimacing Pain Intervention(s): Monitored during session     Hand Dominance Right   Extremity/Trunk Assessment Upper Extremity Assessment Upper Extremity Assessment: RUE deficits/detail RUE Deficits / Details: RUE hemiparesis RUE Sensation: decreased light touch  RUE Coordination: decreased fine motor;decreased gross motor   Lower Extremity Assessment Lower Extremity Assessment: Defer to PT evaluation       Communication Communication Communication: Expressive  difficulties;Receptive difficulties   Cognition Arousal/Alertness: Awake/alert Behavior During Therapy: WFL for tasks assessed/performed Overall Cognitive Status: Impaired/Different from baseline Area of Impairment: Following commands;Awareness;Problem solving         Following Commands: Follows one step commands consistently   Awareness: Emergent Problem Solving: Slow processing;Decreased initiation;Difficulty sequencing;Requires verbal cues General Comments: Pt's spouse at bedside assisting with cues   General Comments  no concerns, pt and family very appreciative of therapy     Home Living Family/patient expects to be discharged to:: Private residence Living Arrangements: Spouse/significant other Available Help at Discharge: Family;Available 24 hours/day Type of Home: House Home Access: Ramped entrance Entrance Stairs-Number of Steps: 2 Entrance Stairs-Rails: Left Home Layout: One level     Bathroom Shower/Tub: Occupational psychologist: Standard     Home Equipment: Bedside commode;Shower seat;Wheelchair - power;Hospital bed;Other (comment)   Additional Comments: has a dog  Lives With: Spouse    Prior Functioning/Environment Level of Independence: Needs assistance  Gait / Transfers Assistance Needed: pt hasn't ambulated since rehab, STEDY transfers to electric chair ADL's / Homemaking Assistance Needed: total assist from spouse, bed level Communication / Swallowing Assistance Needed: expressive aphasia, requires incrased time and cues Comments: Pt likes to do yard work. Pt is independent with all ADLs and functional mobility at baseline.        OT Problem List: Decreased strength;Decreased range of motion;Decreased activity tolerance;Decreased knowledge of use of DME or AE;Pain      OT Treatment/Interventions: Self-care/ADL training;Therapeutic exercise;DME and/or AE instruction;Therapeutic activities;Patient/family education;Balance training    OT  Goals(Current goals can be found in the care plan section) Acute Rehab OT Goals Patient Stated Goal: return home with assist OT Goal Formulation: With patient Time For Goal Achievement: 07/26/20 Potential to Achieve Goals: Fair ADL Goals Pt Will Perform Grooming: with set-up;sitting Pt Will Perform Lower Body Bathing: with set-up;sitting/lateral leans Pt Will Perform Upper Body Dressing: with set-up;sitting Pt Will Perform Lower Body Dressing: with min assist;sit to/from stand  OT Frequency: Min 2X/week    AM-PAC OT "6 Clicks" Daily Activity     Outcome Measure Help from another person eating meals?: A Little Help from another person taking care of personal grooming?: A Little Help from another person toileting, which includes using toliet, bedpan, or urinal?: Total Help from another person bathing (including washing, rinsing, drying)?: A Lot Help from another person to put on and taking off regular upper body clothing?: A Lot Help from another person to put on and taking off regular lower body clothing?: A Lot 6 Click Score: 13   End of Session Equipment Utilized During Treatment:  (Steady) Nurse Communication: Mobility status (pt to sit up in chair for dinner, use of steady)  Activity Tolerance: Patient tolerated treatment well Patient left: in bed;with call bell/phone within reach;with bed alarm set;with family/visitor present  OT Visit Diagnosis: Unsteadiness on feet (R26.81)                Time: 1429-1501 OT Time Calculation (min): 32 min Charges:  OT General Charges $OT Visit: 1 Visit OT Evaluation $OT Eval Moderate Complexity: 1 Mod OT Treatments $Therapeutic Activity: 8-22 mins   Laura Caldas A Emaline Karnes 07/12/2020, 3:25 PM

## 2020-07-12 NOTE — Plan of Care (Signed)
Patient OOB for meals w/ steady. VSS. Condom cath in place. Family at bedside throughout the day. Call bell within reach. Bed alarm on.   Problem: Clinical Measurements: Goal: Ability to maintain clinical measurements within normal limits will improve Outcome: Progressing Goal: Will remain free from infection Outcome: Progressing Goal: Diagnostic test results will improve Outcome: Progressing Goal: Respiratory complications will improve Outcome: Progressing Goal: Cardiovascular complication will be avoided Outcome: Progressing   Problem: Activity: Goal: Risk for activity intolerance will decrease Outcome: Progressing   Problem: Nutrition: Goal: Adequate nutrition will be maintained Outcome: Progressing   Problem: Elimination: Goal: Will not experience complications related to bowel motility Outcome: Progressing Goal: Will not experience complications related to urinary retention Outcome: Progressing   Problem: Pain Managment: Goal: General experience of comfort will improve Outcome: Progressing   Problem: Safety: Goal: Ability to remain free from injury will improve Outcome: Progressing

## 2020-07-13 ENCOUNTER — Inpatient Hospital Stay (HOSPITAL_COMMUNITY): Payer: Medicare Other

## 2020-07-13 LAB — URINALYSIS, ROUTINE W REFLEX MICROSCOPIC
Bacteria, UA: NONE SEEN
Bilirubin Urine: NEGATIVE
Glucose, UA: NEGATIVE mg/dL
Ketones, ur: NEGATIVE mg/dL
Nitrite: POSITIVE — AB
Protein, ur: 100 mg/dL — AB
RBC / HPF: >50 RBC/hpf — ABNORMAL HIGH (ref 0–5)
Specific Gravity, Urine: 1.024 (ref 1.005–1.030)
WBC, UA: >50 WBC/hpf — ABNORMAL HIGH (ref 0–5)
pH: 7 (ref 5.0–8.0)

## 2020-07-13 MED ORDER — SODIUM CHLORIDE 0.9 % IV SOLN
1.0000 g | INTRAVENOUS | Status: DC
  Administered 2020-07-13 – 2020-07-14 (×2): 1 g via INTRAVENOUS
  Filled 2020-07-13 (×2): qty 10

## 2020-07-13 MED ORDER — TRAZODONE HCL 50 MG PO TABS
50.0000 mg | ORAL_TABLET | Freq: Every day | ORAL | Status: DC
  Administered 2020-07-13 – 2020-07-15 (×3): 50 mg via ORAL
  Filled 2020-07-13 (×3): qty 1

## 2020-07-13 MED ORDER — THIAMINE HCL 100 MG PO TABS
100.0000 mg | ORAL_TABLET | Freq: Every day | ORAL | Status: DC
  Administered 2020-07-14 – 2020-07-15 (×2): 100 mg via ORAL
  Filled 2020-07-13 (×4): qty 1

## 2020-07-13 MED ORDER — QUETIAPINE FUMARATE 25 MG PO TABS
50.0000 mg | ORAL_TABLET | Freq: Every day | ORAL | Status: DC
  Administered 2020-07-13 – 2020-07-15 (×3): 50 mg via ORAL
  Filled 2020-07-13 (×3): qty 2

## 2020-07-13 MED ORDER — THIAMINE HCL 100 MG/ML IJ SOLN
100.0000 mg | Freq: Every day | INTRAMUSCULAR | Status: DC
  Administered 2020-07-13: 100 mg via INTRAVENOUS
  Filled 2020-07-13: qty 2

## 2020-07-13 NOTE — Procedures (Signed)
Routine EEG Report  Anthony Downs is a 71 y.o. male with a history of convexity meningioma s/p resection and encephalopathy who is undergoing an EEG to evaluate for seizures.  Report: This EEG was acquired with electrodes placed according to the International 10-20 electrode system (including Fp1, Fp2, F3, F4, C3, C4, P3, P4, O1, O2, T3, T4, T5, T6, A1, A2, Fz, Cz, Pz). The following electrodes were missing or displaced: 0.5cm displacement Cz and Pz 2/2 midline incision.   The best background was 8.5 Hz with intermittent mild diffuse slowing. This activity was reactive to stimulation. There is focal slowing over L>R frontotemporal regions. There is a breach rhythm over Cz-Pz. Drowsiness was manifested by background fragmentation; deeper stages of sleep were identified by sleep spindles and K complexes. Photic stimulation and hyperventilation were not performed.   There were no interictal epileptiform discharges. There were no electrographic seizures identified. Intermittent high frequency low amplitude RUE tremors had no ictal correlate by EEG and clinical semiology by video does not appear epileptic.  Impression and clinical correlation This EEG was obtained while awake and drowsy and is abnormal due to: - Intermittent mild diffuse slowing indicating mild global cerebral dysfunction - Focal slowing indicating superimposed focal dysfunction in the L>R frontotemporal regions - Breach rhythm indicating skull defect over the area of prior midline craniotomy  There were no epileptiform abnormalities seen in this recording. R hand tremors delineated above showed no ictal correlate.  Su Monks, MD [facility] [phone]  If 7pm- 7am, please page neurology on call as listed in [place].

## 2020-07-13 NOTE — Progress Notes (Signed)
Awake most of the night frequently pulling the leads off to the telemetry- currently on standby - repeatedly removed the condom cath as well

## 2020-07-13 NOTE — Progress Notes (Signed)
Patient continuously pulling tele monitor and condom catheter off. Attempted to educate patient on importance. Will place new leads.

## 2020-07-13 NOTE — Progress Notes (Signed)
EEG completed, results pending. 

## 2020-07-13 NOTE — Progress Notes (Signed)
PROGRESS NOTE  Anthony Downs ZOX:[id number] DOB: [birth year] DOA: 07/06/2020 PCP: Leeanne Rio, MD  HPI/Recap of past 59 hours: 70 year old with past medical history of hypertension, obesity, obstructive sleep apnea not on CPAP and parasagittal meningioma status post biparietal craniotomy with resection of meningioma 2 months ago presented to the emergency room on 6/19 with confusion.  Since patient surgery and recovery in inpatient rehab, he has had issues with aphasia, right upper extremity weakness and left lower extremity weakness plus some confusion, worse at night.  Symptoms have continued to progress so patient brought into the emergency room.  CT scan of head unremarkable and patient admitted to the hospitalist service.  MRI unremarkable as well.  Noted to have some B12 deficiency which has been restarted.  Symptoms felt to be from B12 deficiency and/or sleep deprivation.  No signs of infection.  Initially patient started to have some improvement, but at nights when he sundown's, things get worse.  Patient did well in terms of sleep on the night of 6/23.  Transferred to [facility] evening of 6/24 and since then has lots of trouble with sleeping at night, staying awake and being disoriented.  Seen by neurology.  Delirium felt to be multifactorial and appreciate recommendations as detailed below.  This morning, patient noted to have pinkish urine.  Urinalysis sent.  Patient still somewhat groggy.  Assessment/Plan: Active Problems: Acute metabolic encephalopathy in patient with history of meningioma ([place]) status post resection: Definitely having sundowning and this plus B12 deficiency may be because of some issues.  He also has a history of sleep apnea not on any CPAP because unable to tolerate which may be leading to some chronic hypoxia as well.  ABG really unrevealing.  CT and MRI unremarkable.  No evidence of any electrolyte abnormalities, thyroid dysfunction, medication  reaction, medication withdrawal, hepatic encephalopathy, infection or hypoxia.  No evidence of CVA.  Appreciate neurology help.  We will also give trazodone earlier at 8 PM instead of 10 PM and decrease back to 50 mg.  100 mg does not seem to make him sleep, but then he is much more groggy during the daytime.  Neurology recommendations are as follows:  Increase Seroquel from 25mg  to 50mg  po qhs.  -Continue Vit B12-neuro goal level over 500. -check Vit B1 level and start Thiamine 100mg  po qd if level is low.  -OK to continue Trazodone. -Stop use of Ativan and/or Haldol. -Avoid medications on the [place] list for elderly. [email or web address]-[email or web address].org/ -out patient neuropsychology cognitive testing.  -check routine EEG to r/o seizure given recent brain surgery. -Delirium precautions. Lights on and blinds raised during the day and Turn lights off at bedtime.   Communicate clearly and address sensory impairment. Minimize the patient's confusion. Encourage mobility and self-care Optimize nutrition, hydration and regular continence. Minimize risk of injury and agitation. Monitor and respond to pain. Use night-time strategies to promote sleep.    Essential hypertension: Blood pressure at times trending upward, worse when he gets agitated.  Continue home medications.    Hypokalemia: Replace as needed.   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  Patient's wife does not want him to go to skilled nursing.  May end up going home with home health and wife is set up  some aids.  Potentially the next 1 to 2 days.  Would favor him getting more alert, hopefully sleeping at night will help.   Consultants: Neurology  Procedures: EEG results  pending  Antimicrobials: None  DVT prophylaxis: Already on Xarelto  Level of care: Med-Surg   Objective: Vitals:   07/12/20 1930 07/13/20 0728  BP: 116/79 (!) 148/83  Pulse: 90 96  Resp: 17 18  Temp: 98.1 F (36.7 C) 97.9 F (36.6 C)  SpO2: 97% 96%    Intake/Output Summary (Last 24 hours) at 07/13/2020 1011 Last data filed at 07/13/2020 0700 Gross per 24 hour  Intake 720 ml  Output 500 ml  Net 220 ml    Filed Weights   07/06/20 1534 07/07/20 1604  Weight: 119.3 kg 118.1 kg   Body mass index is 35.31 kg/m.  Exam:  General: Alert and oriented x2, but quite fatigued and groggy HEENT: Normocephalic and atraumatic, mucous membranes are moist Cardiovascular: Regular rate and rhythm, S1-S2 Respiratory: Clear to auscultation bilaterally Abdomen: Soft, obese, nontender, normoactive bowel sounds Musculoskeletal: No clubbing or cyanosis or edema Skin: No skin breaks, tears or lesions Psychiatry: Appropriate currently, other times more confused, especially at night   Data Reviewed: CBC: Recent Labs  Lab 07/06/20 1642 07/07/20 0430 07/10/20 1057  WBC 7.8 7.1 6.9  NEUTROABS 4.0  --   --   HGB 13.5 13.1 12.9*  HCT 41.8 40.8 41.1  MCV 93.3 93.8 94.3  PLT 204 183 778    Basic Metabolic Panel: Recent Labs  Lab 07/06/20 1642 07/07/20 0430 07/10/20 1057  NA 139 137 137  K 4.0 3.7 3.8  CL 105 104 104  CO2 26 26 26   GLUCOSE 113* 107* 107*  BUN 12 13 7*  CREATININE 0.91 0.92 0.84  CALCIUM 9.0 9.1 8.8*    GFR: Estimated Creatinine Clearance: 107 mL/min (by C-G formula based on SCr of 0.84 mg/dL). Liver Function Tests: Recent Labs  Lab 07/06/20 1642 07/10/20 1057  AST 17 17  ALT 21 23  ALKPHOS 81 75  BILITOT 0.3 0.5  PROT 6.6 5.8*  ALBUMIN 3.7 3.4*    No results for input(s): LIPASE, AMYLASE in the last 168 hours. Recent Labs  Lab 07/10/20 1057  AMMONIA 23    Coagulation Profile: No results for input(s): INR, PROTIME in the last 168  hours. Cardiac Enzymes: No results for input(s): CKTOTAL, CKMB, CKMBINDEX, TROPONINI in the last 168 hours. BNP (last 3 results) No results for input(s): PROBNP in the last 8760 hours. HbA1C: No results for input(s): HGBA1C in the last 72 hours.  CBG: No results for input(s): GLUCAP in the last 168 hours. Lipid Profile: No results for input(s): CHOL, HDL, LDLCALC, TRIG, CHOLHDL, LDLDIRECT in the last 72 hours. Thyroid Function Tests: No results for input(s): TSH, T4TOTAL, FREET4, T3FREE, THYROIDAB in the last 72 hours.  Anemia Panel: No results for input(s): VITAMINB12, FOLATE, FERRITIN, TIBC, IRON, RETICCTPCT in the last 72 hours.  Urine analysis:    Component Value Date/Time   COLORURINE YELLOW 07/06/2020 1642   APPEARANCEUR CLEAR 07/06/2020 1642   LABSPEC 1.027 07/06/2020 1642   PHURINE 5.0 07/06/2020 1642   GLUCOSEU NEGATIVE 07/06/2020 1642   HGBUR NEGATIVE 07/06/2020 1642   BILIRUBINUR NEGATIVE 07/06/2020 1642   KETONESUR 5 (A) 07/06/2020 1642   PROTEINUR NEGATIVE 07/06/2020 1642   UROBILINOGEN 0.2 04/04/2011 2241   NITRITE NEGATIVE 07/06/2020 1642   LEUKOCYTESUR NEGATIVE 07/06/2020 1642   Sepsis Labs: @LABRCNTIP (procalcitonin:4,lacticidven:4)  ) Recent Results (from the  past 240 hour(s))  Urine culture     Status: Abnormal   Collection Time: 07/06/20  4:42 PM   Specimen: Urine, Clean Catch  Result Value Ref Range Status   Specimen Description   Final    URINE, CLEAN CATCH Performed at [facility], [address] [place], [place] [address]    Special Requests   Final    NONE Performed at [facility], [address], [place], [place] [address]    Culture (A)  Final    30,000 COLONIES/mL MULTIPLE SPECIES PRESENT, SUGGEST RECOLLECTION   Report Status 07/08/2020 FINAL  Final  SARS CORONAVIRUS 2 (TAT 6-24 HRS) Nasopharyngeal Nasopharyngeal Swab     Status: None   Collection Time: 07/06/20  9:15 PM   Specimen: Nasopharyngeal Swab  Result Value Ref Range Status    SARS Coronavirus 2 NEGATIVE NEGATIVE Final    Comment: (NOTE) SARS-CoV-2 target nucleic acids are NOT DETECTED.  The SARS-CoV-2 RNA is generally detectable in upper and lower respiratory specimens during the acute phase of infection. Negative results do not preclude SARS-CoV-2 infection, do not rule out co-infections with other pathogens, and should not be used as the sole basis for treatment or other patient management decisions. Negative results must be combined with clinical observations, patient history, and epidemiological information. The expected result is Negative.  Fact Sheet for Patients: SugarRoll.be  Fact Sheet for Healthcare Providers: https://www.woods-mathews.com/  This test is not yet approved or cleared by the [place] FDA and  has been authorized for detection and/or diagnosis of SARS-CoV-2 by FDA under an Emergency Use Authorization (EUA). This EUA will remain  in effect (meaning this test can be used) for the duration of the COVID-19 declaration under Se ction 564(b)(1) of the Act, 21 U.S.C. section 360bbb-3(b)(1), unless the authorization is terminated or revoked sooner.  Performed at [facility], [place] [address], [place], [place] [address]       Studies: No results found.  Scheduled Meds:  hydrocortisone cream  1 application Topical BID   pantoprazole  40 mg Oral Daily   propranolol  20 mg Oral TID   QUEtiapine  50 mg Oral Q2000   rivaroxaban  20 mg Oral Q supper   senna  1 tablet Oral QHS   senna-docusate  2 tablet Oral Daily   tamsulosin  0.8 mg Oral q morning   thiamine injection  100 mg Intravenous Daily   Or   thiamine  100 mg Oral Daily   traZODone  100 mg Oral QHS    Continuous Infusions:   LOS: 7 days     Annita Brod, MD [facility]   07/13/2020, 10:11 AM

## 2020-07-13 NOTE — Plan of Care (Signed)

## 2020-07-14 DIAGNOSIS — N390 Urinary tract infection, site not specified: Secondary | ICD-10-CM | POA: Clinically undetermined

## 2020-07-14 MED ORDER — MAGIC MOUTHWASH W/LIDOCAINE
15.0000 mL | Freq: Three times a day (TID) | ORAL | Status: DC | PRN
  Administered 2020-07-14 – 2020-07-15 (×2): 15 mL via ORAL
  Filled 2020-07-14 (×3): qty 15

## 2020-07-14 MED ORDER — DICLOFENAC SODIUM 1 % EX GEL
2.0000 g | Freq: Two times a day (BID) | CUTANEOUS | Status: DC
  Administered 2020-07-14 – 2020-07-15 (×4): 2 g via TOPICAL
  Filled 2020-07-14 (×2): qty 100

## 2020-07-14 NOTE — Plan of Care (Signed)
  Problem: Activity: Goal: Risk for activity intolerance will decrease Outcome: Not Progressing   Problem: Nutrition: Goal: Adequate nutrition will be maintained Outcome: Progressing   Problem: Safety: Goal: Ability to remain free from injury will improve Outcome: Progressing

## 2020-07-14 NOTE — Care Management Important Message (Signed)
Important Message  Patient Details  Name: Anthony Downs MRN: [id number] Date of Birth: 09-09-1950   Medicare Important Message Given:  Yes     [name] [address] 07/14/2020, 3:55 PM

## 2020-07-14 NOTE — Progress Notes (Signed)
PROGRESS NOTE  Anthony Downs:[id number] DOB: 1950-12-05 DOA: 07/06/2020 PCP: Leeanne Rio, MD  HPI/Recap of past 47 hours: 70 year old with past medical history of hypertension, obesity, obstructive sleep apnea not on CPAP and parasagittal meningioma status post biparietal craniotomy with resection of meningioma 2 months ago presented to the emergency room on 6/19 with confusion.  Since patient surgery and recovery in inpatient rehab, he has had issues with aphasia, right upper extremity weakness and left lower extremity weakness plus some confusion, worse at night.  Symptoms have continued to progress so patient brought into the emergency room.  CT scan of head unremarkable and patient admitted to the hospitalist service.  MRI unremarkable as well.  Noted to have some B12 deficiency which has been restarted.  Symptoms felt to be from B12 deficiency and/or sleep deprivation.  No signs of infection.  Initially patient started to have some improvement, but at nights when he sundown's, things get worse.  Patient did well in terms of sleep on the night of 6/23.  Transferred to [facility] evening of 6/24 and since then has lots of trouble with sleeping at night, staying awake and being disoriented.  Seen by neurology.  Delirium felt to be multifactorial and appreciate recommendations as detailed below.  Noted to have hematuria on 6/26 and urinalysis sent and patient found to have urinary tract infection and started on Rocephin.  Overnight, much improved.  Slept good portion of the night with only mild confusion.  Fatigue but more alert this morning.  Assessment/Plan: Active Problems: Acute metabolic encephalopathy in patient with history of meningioma ([place]) status post resection: Definitely having sundowning and this plus B12 deficiency may be because of some issues.  He also has a history of sleep apnea not on any CPAP because unable to tolerate which may be leading to some chronic  hypoxia as well.  ABG really unrevealing.  CT and MRI unremarkable.  No evidence of any electrolyte abnormalities, thyroid dysfunction, medication reaction, medication withdrawal, hepatic encephalopathy, infection or hypoxia.  No evidence of CVA.  Appreciate neurology help.  Treating underlying UTI.  Helped giving seroquel and trazadone earlier in evening.  Neurology recommendations are as follows:  Increase Seroquel from 25mg  to 50mg  po qhs.  -Continue Vit B12-neuro goal level over 500. -check Vit B1 level and start Thiamine 100mg  po qd if level is low.  -OK to continue Trazodone. -Stop use of Ativan and/or Haldol. -Avoid medications on the [place] list for elderly. [email or web address]-[email or web address] -out patient neuropsychology cognitive testing.  -check routine EEG to r/o seizure given recent brain surgery. -Delirium precautions. Lights on and blinds raised during the day and Turn lights off at bedtime.   Communicate clearly and address sensory impairment. Minimize the patient's confusion. Encourage mobility and self-care Optimize nutrition, hydration and regular continence. Minimize risk of injury and agitation. Monitor and respond to pain. Use night-time strategies to promote sleep.    Essential hypertension: Blood pressure at times trending upward, worse when he gets agitated.  Continue home medications.    Hypokalemia: Replace as needed.   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  Patient's wife does not want him to go to skilled nursing.  May end up going home with home health and wife is set up  some aids.  Potentially the next 1 to 2 days.  Would favor him getting more alert, hopefully sleeping at night will  help.   Consultants: Neurology  Procedures: EEG results: Abnormal due to focal slowing of left greater than right frontotemporal regions and MRI with multifocal encephalomalacia is likely the cause of this.  No seizures noted.  Antimicrobials: IV Rocephin 6/26-present  DVT prophylaxis: Already on Xarelto  Level of care: Med-Surg   Objective: Vitals:   07/14/20 0455 07/14/20 0716  BP: (!) 159/67 (!) 159/85  Pulse: 91 90  Resp: 18 17  Temp:  98.4 F (36.9 C)  SpO2: 98% 98%    Intake/Output Summary (Last 24 hours) at 07/14/2020 1156 Last data filed at 07/14/2020 0900 Gross per 24 hour  Intake 360 ml  Output 600 ml  Net -240 ml    Filed Weights   07/06/20 1534 07/07/20 1604  Weight: 119.3 kg 118.1 kg   Body mass index is 35.31 kg/m.  Exam:  General: Alert and oriented x2, mildly fatigued, but improved from previous day HEENT: Normocephalic and atraumatic, mucous membranes are moist Cardiovascular: Regular rate and rhythm, S1-S2 Respiratory: Clear to auscultation bilaterally Abdomen: Soft, obese, nontender, normoactive bowel sounds Musculoskeletal: No clubbing or cyanosis or edema Skin: No skin breaks, tears or lesions Psychiatry: Appropriate currently, other times more confused, especially at night   Data Reviewed: CBC: Recent Labs  Lab 07/10/20 1057  WBC 6.9  HGB 12.9*  HCT 41.1  MCV 94.3  PLT 295    Basic Metabolic Panel: Recent Labs  Lab 07/10/20 1057  NA 137  K 3.8  CL 104  CO2 26  GLUCOSE 107*  BUN 7*  CREATININE 0.84  CALCIUM 8.8*    GFR: Estimated Creatinine Clearance: 107 mL/min (by C-G formula based on SCr of 0.84 mg/dL). Liver Function Tests: Recent Labs  Lab 07/10/20 1057  AST 17  ALT 23  ALKPHOS 75  BILITOT 0.5  PROT 5.8*  ALBUMIN 3.4*    No results for input(s): LIPASE, AMYLASE in the last 168 hours. Recent Labs  Lab 07/10/20 1057  AMMONIA 23    Coagulation Profile: No results for input(s): INR, PROTIME in  the last 168 hours. Cardiac Enzymes: No results for input(s): CKTOTAL, CKMB, CKMBINDEX, TROPONINI in the last 168 hours. BNP (last 3 results) No results for input(s): PROBNP in the last 8760 hours. HbA1C: No results for input(s): HGBA1C in the last 72 hours.  CBG: No results for input(s): GLUCAP in the last 168 hours. Lipid Profile: No results for input(s): CHOL, HDL, LDLCALC, TRIG, CHOLHDL, LDLDIRECT in the last 72 hours. Thyroid Function Tests: No results for input(s): TSH, T4TOTAL, FREET4, T3FREE, THYROIDAB in the last 72 hours.  Anemia Panel: No results for input(s): VITAMINB12, FOLATE, FERRITIN, TIBC, IRON, RETICCTPCT in the last 72 hours.  Urine analysis:    Component Value Date/Time   COLORURINE AMBER (A) 07/13/2020 1011   APPEARANCEUR CLOUDY (A) 07/13/2020 1011   LABSPEC 1.024 07/13/2020 1011   PHURINE 7.0 07/13/2020 1011   GLUCOSEU NEGATIVE 07/13/2020 1011   HGBUR LARGE (A) 07/13/2020 1011   BILIRUBINUR NEGATIVE 07/13/2020 1011   KETONESUR NEGATIVE 07/13/2020 1011   PROTEINUR 100 (A) 07/13/2020 1011   UROBILINOGEN 0.2 04/04/2011 2241   NITRITE POSITIVE (A) 07/13/2020 1011   LEUKOCYTESUR MODERATE (A) 07/13/2020 1011   Sepsis Labs: @LABRCNTIP (procalcitonin:4,lacticidven:4)  ) Recent Results (from the past 240 hour(s))  Urine culture     Status: Abnormal   Collection Time: 07/06/20  4:42 PM   Specimen: Urine, Clean Catch  Result Value Ref Range Status   Specimen Description   Final    URINE, CLEAN CATCH Performed at [facility], [address] [place], [place] [address]    Special Requests   Final    NONE Performed at [facility], [address], [place], [place] [address]    Culture (A)  Final    30,000 COLONIES/mL MULTIPLE SPECIES PRESENT, SUGGEST RECOLLECTION   Report Status 07/08/2020 FINAL  Final  SARS CORONAVIRUS 2 (TAT 6-24 HRS) Nasopharyngeal Nasopharyngeal Swab     Status: None   Collection Time: 07/06/20  9:15 PM   Specimen: Nasopharyngeal Swab   Result Value Ref Range Status   SARS Coronavirus 2 NEGATIVE NEGATIVE Final    Comment: (NOTE) SARS-CoV-2 target nucleic acids are NOT DETECTED.  The SARS-CoV-2 RNA is generally detectable in upper and lower respiratory specimens during the acute phase of infection. Negative results do not preclude SARS-CoV-2 infection, do not rule out co-infections with other pathogens, and should not be used as the sole basis for treatment or other patient management decisions. Negative results must be combined with clinical observations, patient history, and epidemiological information. The expected result is Negative.  Fact Sheet for Patients: SugarRoll.be  Fact Sheet for Healthcare Providers: https://www.woods-mathews.com/  This test is not yet approved or cleared by the [place] FDA and  has been authorized for detection and/or diagnosis of SARS-CoV-2 by FDA under an Emergency Use Authorization (EUA). This EUA will remain  in effect (meaning this test can be used) for the duration of the COVID-19 declaration under Se ction 564(b)(1) of the Act, 21 U.S.C. section 360bbb-3(b)(1), unless the authorization is terminated or revoked sooner.  Performed at [facility], [place] [address] [place], [place] [address]       Studies: EEG adult  Result Date: 07/27/2020 Derek Jack, MD     07-27-20  4:26 PM Routine EEG Report Coner Gibbard Whitmire is a 70 y.o. male with a history of convexity meningioma s/p resection and encephalopathy who is undergoing an EEG to evaluate for seizures. Report: This EEG was acquired with electrodes placed according to the International 10-20 electrode system (including Fp1, Fp2, F3, F4, C3, C4, P3, P4, O1, O2, T3, T4, T5, T6, A1, A2, Fz, Cz, Pz). The following electrodes were missing or displaced: 0.5cm displacement Cz and Pz 2/2 midline incision. The best background was 8.5 Hz with intermittent mild diffuse slowing.  This activity was reactive to stimulation. There is focal slowing over L>R frontotemporal regions. There is a breach rhythm over Cz-Pz. Drowsiness was manifested by background fragmentation; deeper stages of sleep were identified by sleep spindles and K complexes. Photic stimulation and hyperventilation were not performed. There were no interictal epileptiform discharges. There were no electrographic seizures identified. Intermittent high frequency low amplitude RUE tremors had no ictal correlate by EEG and clinical semiology by video does not appear epileptic. Impression and clinical correlation This EEG was obtained while awake and drowsy and is abnormal due to: - Intermittent mild diffuse slowing indicating mild global cerebral dysfunction - Focal slowing indicating superimposed focal dysfunction in the L>R frontotemporal regions - Breach rhythm indicating skull defect over the area of prior midline craniotomy There were no epileptiform abnormalities seen in this recording. R hand tremors delineated above showed no ictal correlate. Su Monks, MD [facility] [phone] If 7pm- 7am, please page neurology on call as listed in [place].    Scheduled Meds:  diclofenac Sodium  2 g Topical BID   hydrocortisone cream  1 application Topical BID   pantoprazole  40 mg Oral Daily   propranolol  20 mg Oral TID   QUEtiapine  50 mg Oral Q2000   rivaroxaban  20 mg Oral Q supper   senna  1 tablet Oral QHS   senna-docusate  2 tablet Oral Daily   tamsulosin  0.8 mg Oral q morning   thiamine  100 mg Oral Daily   traZODone  50 mg Oral QHS    Continuous Infusions:  cefTRIAXone (ROCEPHIN)  IV 1 g (07/13/20 1721)     LOS: 8 days     Annita Brod, MD [facility]   07/14/2020, 11:56 AM

## 2020-07-14 NOTE — Plan of Care (Signed)
Chart review note Was asked to f/u EEG in sign out. Reviewed EEG report from last night. No epileptiform discharges seen. Abnnormal - due to focal slowing L>R frontotemporal regions. MRI with multifocal encephalomalacia likely cause of the focal slowing. No seizures or epileptiform discharges seen. Recs as in initial consult note.  -- Amie Portland, MD Neurologist [facility] Pager: [phone]

## 2020-07-14 NOTE — Progress Notes (Signed)
Occupational Therapy Treatment Patient Details Name: Anthony Downs MRN: [id number] DOB: 07-03-1950 Today's Date: 07/14/2020    History of present illness Anthony Downs is a 70 y.o. male who presents with AMS. Since the patient's craniectomy, the patient has had postoperative aphsia, right-sided weakness. MRI head without acute findings. PMH: hypertension, GERD, OSA, parasagittal meningioma s/p biparietal craniectomy with resection of meningioma 04/29/20   OT comments  Anthony Downs is progressing well towards his OT goals. Pt completed 2 sit<>stand trials at the sink given max A +2 for boosting, balance in standing and RUE support during functional activity in standing. Pt completed oral hygiene and face/head washing while standing at the sink with use of LUE. Pt continued to benefit from OT acutely to progress function in ADLs and mobility. D/c recommendation remains appropriate.    Follow Up Recommendations  Home health OT;Supervision/Assistance - 24 hour    Equipment Recommendations  None recommended by OT       Precautions / Restrictions Precautions Precautions: Fall Precaution Comments: R hemiparesis Restrictions Weight Bearing Restrictions: No       Mobility Bed Mobility Overal bed mobility: Needs Assistance             General bed mobility comments: pt in chair upon arrival    Transfers Overall transfer level: Needs assistance Equipment used: 2 person hand held assist Transfers: Sit to/from Stand Sit to Stand: Max assist;+2 physical assistance;+2 safety/equipment         General transfer comment: Assistance to position bilat feet; BUE used to boost into standing and +2 asssitance for mod A to boost. Once standing, pt requried max A+2 to support is R side, and sustains standing balance and tolerance    Balance Overall balance assessment: Needs assistance Sitting-balance support: Feet supported Sitting balance-Leahy Scale: Fair     Standing balance  support: Bilateral upper extremity supported Standing balance-Leahy Scale: Poor             ADL either performed or assessed with clinical judgement   ADL Overall ADL's : Needs assistance/impaired     Grooming: Maximal assistance Grooming Details (indicate cue type and reason): Max A for grooming tasks in standing at sink for supoprt of RLE and RUE           Functional mobility during ADLs: Maximal assistance;+2 for physical assistance;+2 for safety/equipment General ADL Comments: pt completed gooming tasks while standing at the sink in 2 trials, pt tolerated standing all together about 4 minutes to complete tasks with one sitting break in between.     Vision   Vision Assessment?: No apparent visual deficits          Cognition Arousal/Alertness: Awake/alert Behavior During Therapy: WFL for tasks assessed/performed Overall Cognitive Status: Impaired/Different from baseline Area of Impairment: Following commands;Awareness;Problem solving         Following Commands: Follows one step commands consistently   Awareness: Emergent Problem Solving: Slow processing;Decreased initiation;Difficulty sequencing;Requires verbal cues General Comments: Pt's spouse at bedside assisting with cues              General Comments no new concerns - pt's wife applied cream to his back at the end of the session    Pertinent Vitals/ Pain       Pain Assessment: Faces Faces Pain Scale: Hurts a little bit Pain Location: generalized with movement Pain Descriptors / Indicators: Discomfort;Grimacing Pain Intervention(s): Monitored during session   Frequency  Min 2X/week        Progress Toward Goals  OT Goals(current goals can now be found in the care plan section)  Progress towards OT goals: Progressing toward goals  Acute Rehab OT Goals Patient Stated Goal: return home with assist OT Goal Formulation: With patient Time For Goal Achievement: 07/26/20 Potential to Achieve  Goals: Fair ADL Goals Pt Will Perform Grooming: with set-up;sitting Pt Will Perform Lower Body Bathing: with set-up;sitting/lateral leans Pt Will Perform Upper Body Dressing: with set-up;sitting Pt Will Perform Lower Body Dressing: with min assist;sit to/from stand  Plan Discharge plan remains appropriate    Co-evaluation    PT/OT/SLP Co-Evaluation/Treatment: Yes Reason for Co-Treatment: Complexity of the patient's impairments (multi-system involvement);To address functional/ADL transfers   OT goals addressed during session: ADL's and self-care;Other (comment) (functional activity in standing)      AM-PAC OT "6 Clicks" Daily Activity     Outcome Measure   Help from another person eating meals?: A Little Help from another person taking care of personal grooming?: A Little Help from another person toileting, which includes using toliet, bedpan, or urinal?: Total Help from another person bathing (including washing, rinsing, drying)?: A Lot Help from another person to put on and taking off regular upper body clothing?: A Lot Help from another person to put on and taking off regular lower body clothing?: A Lot 6 Click Score: 13    End of Session Equipment Utilized During Treatment: Gait belt  OT Visit Diagnosis: Unsteadiness on feet (R26.81)   Activity Tolerance Patient tolerated treatment well   Patient Left in chair;with call bell/phone within reach;with chair alarm set;with family/visitor present   Nurse Communication Mobility status        Time: 6553-7482 OT Time Calculation (min): 28 min  Charges: OT General Charges $OT Visit: 1 Visit OT Treatments $Self Care/Home Management : 8-22 mins     Ricci Dirocco A Augusta Hilbert 07/14/2020, 12:55 PM

## 2020-07-14 NOTE — Progress Notes (Signed)
Physical Therapy Treatment Patient Details Name: Anthony Downs MRN: [id number] DOB: 09-07-1950 Today's Date: 07/14/2020    History of Present Illness Anthony Downs is a 70 y.o. male who presents with AMS. Since the patient's craniectomy, the patient has had postoperative aphsia, right-sided weakness. MRI head without acute findings. PMH: hypertension, GERD, OSA, parasagittal meningioma s/p biparietal craniectomy with resection of meningioma 04/29/20    PT Comments    Focused on standing balance in conjuction with OT to perform ADL at sink. Pt requires maxAx2 to achieve and maintain standing but put forth great effort with OT brushing teeth and washing face in two separate standing episodes. Pt's spouse continues to feel confident in caring for him at home and feels his cognition and confusion is improving. Acute PT to cont to follow.   Follow Up Recommendations  Supervision/Assistance - 24 hour;Home health PT     Equipment Recommendations  None recommended by PT    Recommendations for Other Services       Precautions / Restrictions Precautions Precautions: Fall Precaution Comments: R hemiparesis Restrictions Weight Bearing Restrictions: No    Mobility  Bed Mobility Overal bed mobility: Needs Assistance             General bed mobility comments: pt in chair upon arrival    Transfers Overall transfer level: Needs assistance Equipment used: 2 person hand held assist Transfers: Sit to/from Stand Sit to Stand: Max assist;+2 physical assistance;+2 safety/equipment         General transfer comment: pt pulled up on sink with bilat knee blocked as well as right foot, maxA to power up and max assist at posterior hips to achieve full hip extension, upright posture, x 2 episodes, 1st stand about 90 sec, 2nd stand about 45 sec. pt attempted to use R UE to support but unable requiring PT to support at chest as well to prevent trunk flexion  Ambulation/Gait              General Gait Details: electric w/c at home   Stairs             Wheelchair Mobility    Modified Rankin (Stroke Patients Only)       Balance Overall balance assessment: Needs assistance Sitting-balance support: Feet supported Sitting balance-Leahy Scale: Fair Sitting balance - Comments: seated EOB   Standing balance support: Bilateral upper extremity supported Standing balance-Leahy Scale: Poor Standing balance comment: BUE reliant on external support of steady                            Cognition Arousal/Alertness: Awake/alert Behavior During Therapy: WFL for tasks assessed/performed Overall Cognitive Status: Impaired/Different from baseline Area of Impairment: Following commands;Awareness;Problem solving                       Following Commands: Follows one step commands consistently   Awareness: Emergent Problem Solving: Slow processing;Decreased initiation;Difficulty sequencing;Requires verbal cues General Comments: Pt's spouse at bedside assisting with cues, delayed response time but appropriate      Exercises      General Comments General comments (skin integrity, edema, etc.): v/c      Pertinent Vitals/Pain Pain Assessment: Faces Faces Pain Scale: Hurts a little bit Pain Location: generalized with movement Pain Descriptors / Indicators: Discomfort;Grimacing Pain Intervention(s): Monitored during session    Home Living  Prior Function            PT Goals (current goals can now be found in the care plan section) Acute Rehab PT Goals Patient Stated Goal: return home with assist PT Goal Formulation: With patient/family Time For Goal Achievement: 07/23/20 Potential to Achieve Goals: Good    Frequency    Min 2X/week      PT Plan Current plan remains appropriate    Co-evaluation PT/OT/SLP Co-Evaluation/Treatment: Yes Reason for Co-Treatment: Complexity of the patient's  impairments (multi-system involvement) PT goals addressed during session: Mobility/safety with mobility OT goals addressed during session: ADL's and self-care;Other (comment) (functional activity in standing)      AM-PAC PT "6 Clicks" Mobility   Outcome Measure  Help needed turning from your back to your side while in a flat bed without using bedrails?: Total Help needed moving from lying on your back to sitting on the side of a flat bed without using bedrails?: Total Help needed moving to and from a bed to a chair (including a wheelchair)?: Total Help needed standing up from a chair using your arms (e.g., wheelchair or bedside chair)?: Total Help needed to walk in hospital room?: Total Help needed climbing 3-5 steps with a railing? : Total 6 Click Score: 6    End of Session Equipment Utilized During Treatment: Gait belt Activity Tolerance: Patient tolerated treatment well Patient left: with call bell/phone within reach;with family/visitor present;in chair;with chair alarm set Nurse Communication: Mobility status;Other (comment) (pt had BM) PT Visit Diagnosis: Other abnormalities of gait and mobility (R26.89);Muscle weakness (generalized) (M62.81);Other symptoms and signs involving the nervous system (R29.898)     Time: 2426-8341 PT Time Calculation (min) (ACUTE ONLY): 25 min  Charges:  $Therapeutic Activity: 8-22 mins                     Kittie Plater, PT, DPT Acute Rehabilitation Services Pager #: [phone] Office #: [phone]    Berline Lopes 07/14/2020, 2:48 PM

## 2020-07-15 LAB — URINE CULTURE: Culture: NO GROWTH

## 2020-07-15 LAB — BASIC METABOLIC PANEL
Anion gap: 7 (ref 5–15)
BUN: 8 mg/dL (ref 8–23)
CO2: 28 mmol/L (ref 22–32)
Calcium: 9.3 mg/dL (ref 8.9–10.3)
Chloride: 103 mmol/L (ref 98–111)
Creatinine, Ser: 1 mg/dL (ref 0.61–1.24)
GFR, Estimated: >60 mL/min (ref >60)
Glucose, Bld: 114 mg/dL — ABNORMAL HIGH (ref 70–99)
Potassium: 3.7 mmol/L (ref 3.5–5.1)
Sodium: 138 mmol/L (ref 135–145)

## 2020-07-15 LAB — CBC
HCT: 40.1 % (ref 39.0–52.0)
Hemoglobin: 12.6 g/dL — ABNORMAL LOW (ref 13.0–17.0)
MCH: 29.5 pg (ref 26.0–34.0)
MCHC: 31.4 g/dL (ref 30.0–36.0)
MCV: 93.9 fL (ref 80.0–100.0)
Platelets: 201 10*3/uL (ref 150–400)
RBC: 4.27 MIL/uL (ref 4.22–5.81)
RDW: 13.4 % (ref 11.5–15.5)
WBC: 11.1 10*3/uL — ABNORMAL HIGH (ref 4.0–10.5)
nRBC: 0 % (ref 0.0–0.2)

## 2020-07-15 MED ORDER — CYANOCOBALAMIN 500 MCG PO TABS: 500.0000 ug | ORAL_TABLET | Freq: Every day | ORAL | 1 refills | Status: DC

## 2020-07-15 MED ORDER — TRAZODONE HCL 50 MG PO TABS: 50.0000 mg | ORAL_TABLET | Freq: Every day | ORAL | 1 refills | Status: DC

## 2020-07-15 MED ORDER — QUETIAPINE FUMARATE 50 MG PO TABS
50.0000 mg | ORAL_TABLET | Freq: Every day | ORAL | 1 refills | Status: DC
Start: 2020-07-15 — End: 2020-09-01

## 2020-07-15 MED ORDER — THIAMINE HCL 100 MG PO TABS: 100.0000 mg | ORAL_TABLET | Freq: Every day | ORAL | 1 refills | Status: DC

## 2020-07-15 MED ORDER — MAGIC MOUTHWASH W/LIDOCAINE: 15.0000 mL | Freq: Three times a day (TID) | ORAL | 0 refills | Status: DC | PRN

## 2020-07-15 MED ORDER — CEFPODOXIME PROXETIL 200 MG PO TABS: 200.0000 mg | ORAL_TABLET | Freq: Two times a day (BID) | ORAL | 0 refills | Status: DC

## 2020-07-15 MED ORDER — SODIUM CHLORIDE 0.9 % IV SOLN
1.0000 g | INTRAVENOUS | Status: DC
  Administered 2020-07-15: 1 g via INTRAVENOUS
  Filled 2020-07-15: qty 10

## 2020-07-15 MED ORDER — TAMSULOSIN HCL 0.4 MG PO CAPS: 0.8000 mg | ORAL_CAPSULE | Freq: Every morning | ORAL | Status: DC

## 2020-07-15 NOTE — Discharge Summary (Signed)
Discharge Summary  Anthony Downs YBO:[id number] DOB: [birth year]  PCP: Leeanne Rio, MD  Admit date: 07/06/2020 Discharge date: 07/15/2020  Time spent: 25 minutes  Recommendations for Outpatient Follow-up:  New medication: Vantin 200 mg p.o. daily x5 days New medication: Seroquel 50 mg p.o. nightly Medication change: Trazodone 50 mg p.o. nightly Recommendation for patient to discontinue Zyrtec and baclofen New medication: Thiamine 100 mg p.o. daily New medication: B12 p.o. 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    Discharge Condition: Improved, being discharged home with home health PT and RN  Diet recommendation: Low-sodium  Vitals:   07/15/20 0812 07/15/20 1158  BP: (!) 144/68 131/64  Pulse: 70 99  Resp: 20 18  Temp: 97.7 F (36.5 C) 98.7 F (37.1 C)  SpO2: 98% 100%    History of present illness:  70 year old with past medical history of hypertension, obesity, obstructive sleep apnea not on CPAP and parasagittal meningioma status post biparietal craniotomy with resection of meningioma 2 months ago presented to the emergency room on 6/19 with confusion.  Since patient surgery and recovery in inpatient rehab, he has had issues with aphasia, right upper extremity weakness and left lower extremity weakness plus some confusion, worse at night.  Symptoms have continued to progress so patient brought into the emergency room.  CT scan of head unremarkable and patient admitted to the hospitalist service.  MRI unremarkable as well.  Noted to have some B12 deficiency which has been restarted.  Symptoms felt to be from B12 deficiency and/or  sleep deprivation.  No signs of infection.  Initially patient started to have some improvement, but at nights when he sundown's, things get worse.  Hospital Course:  Active Problems:   Meningioma ([place])   Essential hypertension   Hypokalemia   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[facility])   Delirium   Sleep apnea   GERD (gastroesophageal reflux disease)   Acute lower UTI Active Problems: Acute metabolic encephalopathy in patient with history of meningioma ([place]) status post resection: Patient went through extensive evaluation.  No evidence of electrolyte abnormalities, thyroid dysfunction, medication reaction or withdrawal, hepatic encephalopathy, infection or hypoxia.  No evidence of CVA.  Was found to have a large urinary tract infection and started on Rocephin.  Found to have a mild B12 deficiency and replacement therapy initiated.  Neurology consulted.  It was felt that some of his issues were likely from and so attempts were made to ensure that he slept at night and stayed awake during the day including positioning, open windows and lites during the day as well as medication avoidances and initiations as below.   Increase Seroquel from 25mg  to 50mg  po qhs.  -Continue Vit B12-neuro goal level over 500. -check Vit B1 level and start Thiamine 100mg  po qd if level is low.  -OK to continue Trazodone. -Stop use of Ativan and/or Haldol. -Avoid medications on the [place] list for elderly.  -out patient neuropsychology cognitive testing.  -check routine EEG to r/o seizure given recent brain surgery. -Delirium precautions. Lights on and blinds raised during the day and Turn lights off at bedtime.   Communicate clearly and address sensory impairment. Minimize the patient's confusion.  Encourage mobility and self-care Optimize nutrition, hydration and regular continence. Minimize risk of injury and agitation. Monitor and respond to pain. Use night-time strategies to promote sleep.  By 6/28 patient had slept for several  nights consistently and urinary infection much better treated.  He seemed to be much more awake and interactive and responsive during the day.  Will go home with home health PT and OT and RN.  His wife is set up at nighttime aid to help care for him should he get agitated.     Essential hypertension: Blood pressure at times trending upward, worse when he was agitated.  Continue home medications.     Hypokalemia: Replaced as needed.    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[place])   Obesity: Meets criteria BMI greater than 30     Sleep apnea: Refused nasal CPAP     GERD (gastroesophageal reflux disease): Continue PPI  Consultants: Neurology   Procedures: EEG results: Abnormal due to focal slowing of left greater than right frontotemporal regions and MRI with multifocal encephalomalacia is likely the cause of this.  No seizures noted.  Discharge Exam: BP 131/64 (BP Location: Left Arm)   Pulse 99   Temp 98.7 F (37.1 C) (Oral)   Resp 18   Ht 6' (1.829 m)   Wt 118.1 kg   SpO2 100%   BMI 35.31 kg/m   General: Alert and oriented x2, no acute distress Cardiovascular: Regular rate and rhythm, S1-S2 Respiratory: Clear to auscultation bilaterally  Discharge Instructions You were cared for by a hospitalist during your hospital stay. If you have any questions about your discharge medications or the care you received while you were in the hospital after you are discharged, you can call the unit and asked to speak with the hospitalist on call if the hospitalist that took care of you is not available. Once you are discharged, your primary care physician will handle any further medical issues. Please note that NO REFILLS for any discharge medications will be authorized once you are discharged, as it is imperative that you return to your primary care physician (or establish a relationship with a primary care physician if you do not have one) for your aftercare needs so that they can reassess your need for medications and  monitor your lab values.  Discharge Instructions     Diet - low sodium heart healthy   Complete by: As directed    Increase activity slowly   Complete by: As directed       Allergies as of 07/15/2020   No Known Allergies      Medication List     STOP taking these medications    baclofen 10 MG tablet Commonly known as: LIORESAL   cetirizine 10 MG tablet Commonly known as: ZYRTEC       TAKE these medications    atorvastatin 10 MG tablet Commonly known as: LIPITOR Take 1 tablet (10 mg total) by mouth at bedtime.   cefpodoxime 200 MG tablet Commonly known as: VANTIN Take 1 tablet (200 mg total) by mouth 2 (two) times daily.   diclofenac Sodium 1 % Gel Commonly known as: VOLTAREN Apply 2 g topically 4 (four) times daily.   furosemide 20 MG tablet Commonly known as: LASIX Take 1 tablet (20 mg total) by mouth daily.   hydrocortisone cream 1 % Apply topically 3 (three) times daily. To rash on the back--purchase over the counter   hydrOXYzine 25 MG tablet Commonly known as: ATARAX/VISTARIL Take 1 tablet (25 mg total) by  mouth 3 (three) times daily as needed for anxiety.   magic mouthwash w/lidocaine Soln Take 15 mLs by mouth 3 (three) times daily as needed for mouth pain.   pantoprazole 40 MG tablet Commonly known as: PROTONIX Take 1 tablet (40 mg total) by mouth daily.   potassium chloride SA 20 MEQ tablet Commonly known as: KLOR-CON Take 1 tablet (20 mEq total) by mouth daily.   propranolol 20 MG tablet Commonly known as: INDERAL Take 1 tablet (20 mg total) by mouth 3 (three) times daily.   QUEtiapine 50 MG tablet Commonly known as: SEROQUEL Take 1 tablet (50 mg total) by mouth at bedtime.   rivaroxaban 20 MG Tabs tablet Commonly known as: XARELTO Take 1 tablet (20 mg total) by mouth daily with supper.   senna-docusate 8.6-50 MG tablet Commonly known as: Senokot-S Take 2 tablets by mouth 2 (two) times daily. Need to purchase over the counter    SOOTHE OP Apply 1 drop to eye daily as needed (dry eyes).   tamsulosin 0.4 MG Caps capsule Commonly known as: FLOMAX Take 2 capsules (0.8 mg total) by mouth every morning.   thiamine 100 MG tablet Take 1 tablet (100 mg total) by mouth daily. Start taking on: July 16, 2020   traZODone 50 MG tablet Commonly known as: DESYREL Take 1 tablet (50 mg total) by mouth at bedtime.   vitamin B-12 500 MCG tablet Commonly known as: CYANOCOBALAMIN Take 1 tablet (500 mcg total) by mouth daily.       No Known Allergies    The results of significant diagnostics from this hospitalization (including imaging, microbiology, ancillary and laboratory) are listed below for reference.    Significant Diagnostic Studies: CT Head Wo Contrast  Result Date: 07/06/2020 CLINICAL DATA:  Altered mental status. Delirium. Recent brain tumor resection EXAM: CT HEAD WITHOUT CONTRAST TECHNIQUE: Contiguous axial images were obtained from the base of the skull through the vertex without intravenous contrast. COMPARISON:  CT head 06/10/2020 FINDINGS: Brain: Convexity craniotomy and cranioplasty for removal of convexity meningioma. There is encephalomalacia left greater than right over the convexity, unchanged. No recurrent mass lesion seen. 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 Small area of encephalomalacia left anterior temporal lobe unchanged. Negative for acute infarct, or hemorrhage. Vascular: Normal arterial flow voids. Skull: Convexity craniotomy with cranioplasty. Sinuses/Orbits: Minimal mucosal edema in the maxillary sinus bilaterally. Negative orbit Other: None IMPRESSION: Postop resection of convexity meningioma. No acute abnormality and no change from the prior CT. Electronically Signed   By: Franchot Gallo M.D.   On: 07/06/2020 17:25   MR Brain W and Wo Contrast  Result Date: 07/07/2020 CLINICAL DATA:  Delirium. Additional history provided: Episodes of confusion and throwing things. Strong  urine odor and frequency. Symptoms started on Friday. History of brain tumor. EXAM: MRI HEAD WITHOUT AND WITH CONTRAST TECHNIQUE: Multiplanar, multiecho pulse sequences of the brain and surrounding structures were obtained without and with intravenous contrast. CONTRAST:  18mL GADAVIST GADOBUTROL 1 MMOL/ML IV SOLN COMPARISON:  Prior brain MRI examinations 05/01/2020 and earlier. FINDINGS: Brain: The examination is intermittently motion degraded, limiting evaluation. Most notably, there is moderate/severe motion degradation of the axial T1 weighted postcontrast sequence and moderate motion degradation of the sagittal T1 weighted postcontrast sequence. Redemonstrated sequela of prior biparietal craniotomy/cranioplasty for prior meningioma resection. Encephalomalacia within the left greater than right paramedian posterior frontal lobes at the resection site. As before, a residual subcentimeter focus of tumor is questioned within the posterosuperior sagittal sinus (series  16, image 6). No other masslike or nodular enhancement at the resection site. Small amount of chronic hemosiderin deposition within the posterior left frontal lobe. Redemonstrated chronic encephalomalacia within the anterior left frontal and temporal lobes, likely posttraumatic. Background mild multifocal T2/FLAIR hyperintensity within the cerebral white matter, nonspecific but compatible with chronic small vessel ischemic disease. Minimal chronic small-vessel ischemic changes are also present within the pons. Mild generalized parenchymal atrophy. There is no acute infarct. No extra-axial fluid collection. No midline shift. Vascular: Prior partial resection of the superior sagittal sinus. Expected proximal arterial flow voids. Skull and upper cervical spine: No focal marrow lesion. Sinuses/Orbits: Visualized orbits show no acute finding. No significant paranasal sinus disease at the imaged levels. IMPRESSION: Motion degraded examination, as described  and limiting evaluation. No evidence of acute intracranial abnormality. Interval evolution of postoperative changes related to previous parafalcine meningioma resection. As before, a subcentimeter focus of residual tumor is questioned within the posterosuperior sagittal sinus. Postoperative encephalomalacia within the left greater than right posteromedial frontal lobes. Redemonstrated chronic encephalomalacia and gliosis within the anterior left frontal and temporal lobes, likely posttraumatic. Stable background mild generalized parenchymal atrophy and chronic small vessel ischemic disease. Electronically Signed   By: Kellie Simmering DO   On: 07/07/2020 08:24   EEG adult  Result Date: 07/13/2020 Derek Jack, MD     07/13/2020  4:26 PM Routine EEG Report Anthony Downs is a 70 y.o. male with a history of convexity meningioma s/p resection and encephalopathy who is undergoing an EEG to evaluate for seizures. Report: This EEG was acquired with electrodes placed according to the International 10-20 electrode system (including Fp1, Fp2, F3, F4, C3, C4, P3, P4, O1, O2, T3, T4, T5, T6, A1, A2, Fz, Cz, Pz). The following electrodes were missing or displaced: 0.5cm displacement Cz and Pz 2/2 midline incision. The best background was 8.5 Hz with intermittent mild diffuse slowing. This activity was reactive to stimulation. There is focal slowing over L>R frontotemporal regions. There is a breach rhythm over Cz-Pz. Drowsiness was manifested by background fragmentation; deeper stages of sleep were identified by sleep spindles and K complexes. Photic stimulation and hyperventilation were not performed. There were no interictal epileptiform discharges. There were no electrographic seizures identified. Intermittent high frequency low amplitude RUE tremors had no ictal correlate by EEG and clinical semiology by video does not appear epileptic. Impression and clinical correlation This EEG was obtained while awake and drowsy  and is abnormal due to: - Intermittent mild diffuse slowing indicating mild global cerebral dysfunction - Focal slowing indicating superimposed focal dysfunction in the L>R frontotemporal regions - Breach rhythm indicating skull defect over the area of prior midline craniotomy There were no epileptiform abnormalities seen in this recording. R hand tremors delineated above showed no ictal correlate. Su Monks, MD [facility] [phone] If 7pm- 7am, please page neurology on call as listed in [place].    Microbiology: Recent Results (from the past 240 hour(s))  Urine culture     Status: Abnormal   Collection Time: 07/06/20  4:42 PM   Specimen: Urine, Clean Catch  Result Value Ref Range Status   Specimen Description   Final    URINE, CLEAN CATCH Performed at [facility], [address] [place], [place] [address]    Special Requests   Final    NONE Performed at [facility], [address], [place], [place] [address]    Culture (A)  Final    30,000 COLONIES/mL MULTIPLE SPECIES PRESENT, SUGGEST  RECOLLECTION   Report Status 07/08/2020 FINAL  Final  SARS CORONAVIRUS 2 (TAT 6-24 HRS) Nasopharyngeal Nasopharyngeal Swab     Status: None   Collection Time: 07/06/20  9:15 PM   Specimen: Nasopharyngeal Swab  Result Value Ref Range Status   SARS Coronavirus 2 NEGATIVE NEGATIVE Final    Comment: (NOTE) SARS-CoV-2 target nucleic acids are NOT DETECTED.  The SARS-CoV-2 RNA is generally detectable in upper and lower respiratory specimens during the acute phase of infection. Negative results do not preclude SARS-CoV-2 infection, do not rule out co-infections with other pathogens, and should not be used as the sole basis for treatment or other patient management decisions. Negative results must be combined with clinical observations, patient history, and epidemiological information. The expected result is Negative.  Fact Sheet for  Patients: SugarRoll.be  Fact Sheet for Healthcare Providers: https://www.woods-mathews.com/  This test is not yet approved or cleared by the [place] FDA and  has been authorized for detection and/or diagnosis of SARS-CoV-2 by FDA under an Emergency Use Authorization (EUA). This EUA will remain  in effect (meaning this test can be used) for the duration of the COVID-19 declaration under Se ction 564(b)(1) of the Act, 21 U.S.C. section 360bbb-3(b)(1), unless the authorization is terminated or revoked sooner.  Performed at [facility], [place] [address], [place], [place] [address]   Culture, Urine     Status: None   Collection Time: 07/14/20  1:47 PM   Specimen: Urine, Random  Result Value Ref Range Status   Specimen Description URINE, RANDOM  Final   Special Requests NONE  Final   Culture   Final    NO GROWTH Performed at [facility], [place] [address], [place], [place] [address]    Report Status 07/15/2020 FINAL  Final     Labs: Basic Metabolic Panel: Recent Labs  Lab 07/10/20 1057 07/15/20 0504  NA 137 138  K 3.8 3.7  CL 104 103  CO2 26 28  GLUCOSE 107* 114*  BUN 7* 8  CREATININE 0.84 1.00  CALCIUM 8.8* 9.3   Liver Function Tests: Recent Labs  Lab 07/10/20 1057  AST 17  ALT 23  ALKPHOS 75  BILITOT 0.5  PROT 5.8*  ALBUMIN 3.4*   No results for input(s): LIPASE, AMYLASE in the last 168 hours. Recent Labs  Lab 07/10/20 1057  AMMONIA 23   CBC: Recent Labs  Lab 07/10/20 1057 07/15/20 0504  WBC 6.9 11.1*  HGB 12.9* 12.6*  HCT 41.1 40.1  MCV 94.3 93.9  PLT 207 201   Cardiac Enzymes: No results for input(s): CKTOTAL, CKMB, CKMBINDEX, TROPONINI in the last 168 hours. BNP: BNP (last 3 results) No results for input(s): BNP in the last 8760 hours.  ProBNP (last 3 results) No results for input(s): PROBNP in the last 8760 hours.  CBG: No results for input(s): GLUCAP in the last 168  hours.     Signed:  Annita Brod, MD [facility] 07/15/2020, 2:11 PM

## 2020-07-15 NOTE — Progress Notes (Signed)
PTAR here to discharge pt home; vital signs stable & is no respiratory distress.

## 2020-07-15 NOTE — Plan of Care (Signed)
  Problem: Activity: Goal: Risk for activity intolerance will decrease Outcome: Progressing   Problem: Skin Integrity: Goal: Risk for impaired skin integrity will decrease Outcome: Progressing   

## 2020-07-15 NOTE — Progress Notes (Signed)
Pt given discharge instructions and gone over with wife. She verbalized understanding. Pt given last dose ABT IV. Pt awaiting PTAR for transport home. Pt in no distress.

## 2020-07-24 ENCOUNTER — Telehealth: Payer: Self-pay | Admitting: *Deleted

## 2020-07-24 LAB — VITAMIN B1: Vitamin B1 (Thiamine): 123.2 nmol/L (ref 66.5–200.0)

## 2020-07-24 NOTE — Telephone Encounter (Signed)
Anthony Downs called for POC 1wk1, 2wk3. Approval given.

## 2020-08-11 ENCOUNTER — Telehealth: Payer: Self-pay | Admitting: Orthopedic Surgery

## 2020-08-11 NOTE — Telephone Encounter (Signed)
Patient's wife and designated party contact called to relay that he needs to reschedule tomorrow's appointment, 08/12/20, due to testing positive for covid on 08/02/20. They have rescheduled, however, please advise if Xrays may be needed for the appointment, reschedule date 08/22/20, as Ms. Kochel explained that she will have a new wheelchair, but due to patient's recent surgery for brain tumor, please advise if an Xray is needed, would it need to be done at [facility] prior to this appointment?

## 2020-08-12 ENCOUNTER — Other Ambulatory Visit: Payer: Self-pay

## 2020-08-12 ENCOUNTER — Emergency Department (HOSPITAL_COMMUNITY): Payer: Medicare Other

## 2020-08-12 ENCOUNTER — Other Ambulatory Visit: Payer: Self-pay | Admitting: Physical Medicine and Rehabilitation

## 2020-08-12 ENCOUNTER — Ambulatory Visit: Payer: Medicare Other | Admitting: Orthopedic Surgery

## 2020-08-12 ENCOUNTER — Inpatient Hospital Stay (HOSPITAL_COMMUNITY)
Admission: EM | Admit: 2020-08-12 | Discharge: 2020-08-16 | DRG: 100 | Disposition: A | Discharge status: Home Health | Payer: Medicare Other | Attending: Internal Medicine | Admitting: Internal Medicine

## 2020-08-12 DIAGNOSIS — E669 Obesity, unspecified: Secondary | ICD-10-CM | POA: Diagnosis present

## 2020-08-12 DIAGNOSIS — K219 Gastro-esophageal reflux disease without esophagitis: Secondary | ICD-10-CM | POA: Diagnosis not present

## 2020-08-12 DIAGNOSIS — G9341 Metabolic encephalopathy: Secondary | ICD-10-CM | POA: Diagnosis present

## 2020-08-12 DIAGNOSIS — F05 Delirium due to known physiological condition: Secondary | ICD-10-CM | POA: Diagnosis not present

## 2020-08-12 DIAGNOSIS — G4733 Obstructive sleep apnea (adult) (pediatric): Secondary | ICD-10-CM | POA: Diagnosis not present

## 2020-08-12 DIAGNOSIS — R32 Unspecified urinary incontinence: Secondary | ICD-10-CM | POA: Diagnosis present

## 2020-08-12 DIAGNOSIS — Z7901 Long term (current) use of anticoagulants: Secondary | ICD-10-CM

## 2020-08-12 DIAGNOSIS — Z86718 Personal history of other venous thrombosis and embolism: Secondary | ICD-10-CM

## 2020-08-12 DIAGNOSIS — R451 Restlessness and agitation: Secondary | ICD-10-CM | POA: Diagnosis present

## 2020-08-12 DIAGNOSIS — Z86011 Personal history of benign neoplasm of the brain: Secondary | ICD-10-CM | POA: Diagnosis not present

## 2020-08-12 DIAGNOSIS — Z7401 Bed confinement status: Secondary | ICD-10-CM

## 2020-08-12 DIAGNOSIS — R569 Unspecified convulsions: Secondary | ICD-10-CM | POA: Diagnosis not present

## 2020-08-12 DIAGNOSIS — Z6834 Body mass index (BMI) 34.0-34.9, adult: Secondary | ICD-10-CM

## 2020-08-12 DIAGNOSIS — J45909 Unspecified asthma, uncomplicated: Secondary | ICD-10-CM | POA: Diagnosis present

## 2020-08-12 DIAGNOSIS — Z79899 Other long term (current) drug therapy: Secondary | ICD-10-CM | POA: Diagnosis not present

## 2020-08-12 DIAGNOSIS — B9620 Unspecified Escherichia coli [E. coli] as the cause of diseases classified elsewhere: Secondary | ICD-10-CM | POA: Diagnosis present

## 2020-08-12 DIAGNOSIS — R059 Cough, unspecified: Secondary | ICD-10-CM

## 2020-08-12 DIAGNOSIS — Z85828 Personal history of other malignant neoplasm of skin: Secondary | ICD-10-CM

## 2020-08-12 DIAGNOSIS — G9389 Other specified disorders of brain: Secondary | ICD-10-CM | POA: Diagnosis present

## 2020-08-12 DIAGNOSIS — D496 Neoplasm of unspecified behavior of brain: Secondary | ICD-10-CM | POA: Diagnosis present

## 2020-08-12 DIAGNOSIS — Z888 Allergy status to other drugs, medicaments and biological substances status: Secondary | ICD-10-CM

## 2020-08-12 DIAGNOSIS — U071 COVID-19: Secondary | ICD-10-CM | POA: Diagnosis present

## 2020-08-12 DIAGNOSIS — N390 Urinary tract infection, site not specified: Secondary | ICD-10-CM | POA: Diagnosis present

## 2020-08-12 DIAGNOSIS — E538 Deficiency of other specified B group vitamins: Secondary | ICD-10-CM | POA: Diagnosis not present

## 2020-08-12 DIAGNOSIS — I10 Essential (primary) hypertension: Secondary | ICD-10-CM | POA: Diagnosis present

## 2020-08-12 DIAGNOSIS — G9340 Encephalopathy, unspecified: Secondary | ICD-10-CM

## 2020-08-12 DIAGNOSIS — Z9889 Other specified postprocedural states: Secondary | ICD-10-CM

## 2020-08-12 DIAGNOSIS — I82409 Acute embolism and thrombosis of unspecified deep veins of unspecified lower extremity: Secondary | ICD-10-CM | POA: Diagnosis present

## 2020-08-12 LAB — CBC
HCT: 42.1 % (ref 39.0–52.0)
Hemoglobin: 13.7 g/dL (ref 13.0–17.0)
MCH: 29.7 pg (ref 26.0–34.0)
MCHC: 32.5 g/dL (ref 30.0–36.0)
MCV: 91.1 fL (ref 80.0–100.0)
Platelets: 248 10*3/uL (ref 150–400)
RBC: 4.62 MIL/uL (ref 4.22–5.81)
RDW: 13.7 % (ref 11.5–15.5)
WBC: 7.3 10*3/uL (ref 4.0–10.5)
nRBC: 0 % (ref 0.0–0.2)

## 2020-08-12 LAB — DIFFERENTIAL
Abs Immature Granulocytes: 0.04 10*3/uL (ref 0.00–0.07)
Basophils Absolute: 0 10*3/uL (ref 0.0–0.1)
Basophils Relative: 0 %
Eosinophils Absolute: 0.1 10*3/uL (ref 0.0–0.5)
Eosinophils Relative: 2 %
Immature Granulocytes: 1 %
Lymphocytes Relative: 26 %
Lymphs Abs: 1.9 10*3/uL (ref 0.7–4.0)
Monocytes Absolute: 0.6 10*3/uL (ref 0.1–1.0)
Monocytes Relative: 8 %
Neutro Abs: 4.6 10*3/uL (ref 1.7–7.7)
Neutrophils Relative %: 63 %

## 2020-08-12 LAB — PROTIME-INR
INR: 1.4 — ABNORMAL HIGH (ref 0.8–1.2)
Prothrombin Time: 16.9 seconds — ABNORMAL HIGH (ref 11.4–15.2)

## 2020-08-12 LAB — COMPREHENSIVE METABOLIC PANEL
ALT: 29 U/L (ref 0–44)
AST: 19 U/L (ref 15–41)
Albumin: 3.7 g/dL (ref 3.5–5.0)
Alkaline Phosphatase: 75 U/L (ref 38–126)
Anion gap: 7 (ref 5–15)
BUN: 13 mg/dL (ref 8–23)
CO2: 25 mmol/L (ref 22–32)
Calcium: 8.9 mg/dL (ref 8.9–10.3)
Chloride: 103 mmol/L (ref 98–111)
Creatinine, Ser: 0.83 mg/dL (ref 0.61–1.24)
GFR, Estimated: >60 mL/min (ref >60)
Glucose, Bld: 123 mg/dL — ABNORMAL HIGH (ref 70–99)
Potassium: 4.1 mmol/L (ref 3.5–5.1)
Sodium: 135 mmol/L (ref 135–145)
Total Bilirubin: 0.7 mg/dL (ref 0.3–1.2)
Total Protein: 6.6 g/dL (ref 6.5–8.1)

## 2020-08-12 LAB — APTT: aPTT: 29 seconds (ref 24–36)

## 2020-08-12 LAB — CBG MONITORING, ED: Glucose-Capillary: 115 mg/dL — ABNORMAL HIGH (ref 70–99)

## 2020-08-12 LAB — RESP PANEL BY RT-PCR (FLU A&B, COVID) ARPGX2
Influenza A by PCR: NEGATIVE
Influenza B by PCR: NEGATIVE
SARS Coronavirus 2 by RT PCR: POSITIVE — AB

## 2020-08-12 MED ORDER — RIVAROXABAN 20 MG PO TABS
20.0000 mg | ORAL_TABLET | Freq: Every day | ORAL | Status: DC
  Administered 2020-08-12 – 2020-08-15 (×4): 20 mg via ORAL
  Filled 2020-08-12 (×4): qty 1

## 2020-08-12 MED ORDER — SODIUM CHLORIDE 0.9% FLUSH
3.0000 mL | Freq: Once | INTRAVENOUS | Status: AC
Start: 2020-08-12 — End: 2020-08-12
  Administered 2020-08-12: 3 mL via INTRAVENOUS

## 2020-08-12 MED ORDER — ACETAMINOPHEN 325 MG PO TABS: 650.0000 mg | ORAL_TABLET | Freq: Four times a day (QID) | ORAL | Status: DC | PRN

## 2020-08-12 MED ORDER — VITAMIN B-12 100 MCG PO TABS
500.0000 ug | ORAL_TABLET | Freq: Every day | ORAL | Status: DC
  Administered 2020-08-13 – 2020-08-16 (×4): 500 ug via ORAL
  Filled 2020-08-12 (×4): qty 5

## 2020-08-12 MED ORDER — PROPRANOLOL HCL 20 MG PO TABS
20.0000 mg | ORAL_TABLET | Freq: Three times a day (TID) | ORAL | Status: DC
  Administered 2020-08-12 – 2020-08-14 (×5): 20 mg via ORAL
  Filled 2020-08-12 (×7): qty 1

## 2020-08-12 MED ORDER — SODIUM CHLORIDE 0.9 % IV SOLN: 2000.0000 mg | Freq: Once | INTRAVENOUS | Status: DC

## 2020-08-12 MED ORDER — TRAZODONE HCL 100 MG PO TABS
100.0000 mg | ORAL_TABLET | Freq: Every day | ORAL | Status: DC
  Administered 2020-08-12 – 2020-08-15 (×4): 100 mg via ORAL
  Filled 2020-08-12 (×4): qty 1

## 2020-08-12 MED ORDER — PANTOPRAZOLE SODIUM 40 MG PO TBEC
40.0000 mg | DELAYED_RELEASE_TABLET | Freq: Every day | ORAL | Status: DC
  Administered 2020-08-13 – 2020-08-16 (×4): 40 mg via ORAL
  Filled 2020-08-12 (×4): qty 1

## 2020-08-12 MED ORDER — LEVETIRACETAM 500 MG PO TABS
500.0000 mg | ORAL_TABLET | Freq: Two times a day (BID) | ORAL | Status: DC
  Administered 2020-08-12 – 2020-08-16 (×8): 500 mg via ORAL
  Filled 2020-08-12 (×7): qty 1
  Filled 2020-08-12: qty 2

## 2020-08-12 MED ORDER — ONDANSETRON HCL 4 MG/2ML IJ SOLN: 4.0000 mg | Freq: Four times a day (QID) | INTRAMUSCULAR | Status: DC | PRN

## 2020-08-12 MED ORDER — POLYETHYLENE GLYCOL 3350 17 G PO PACK: 17.0000 g | PACK | Freq: Every day | ORAL | Status: DC | PRN

## 2020-08-12 MED ORDER — ACETAMINOPHEN 650 MG RE SUPP: 650.0000 mg | Freq: Four times a day (QID) | RECTAL | Status: DC | PRN

## 2020-08-12 MED ORDER — THIAMINE HCL 100 MG PO TABS
100.0000 mg | ORAL_TABLET | Freq: Every day | ORAL | Status: DC
  Administered 2020-08-13 – 2020-08-16 (×4): 100 mg via ORAL
  Filled 2020-08-12 (×5): qty 1

## 2020-08-12 MED ORDER — LEVETIRACETAM IN NACL 500 MG/100ML IV SOLN
500.0000 mg | Freq: Two times a day (BID) | INTRAVENOUS | Status: DC
  Filled 2020-08-12: qty 100

## 2020-08-12 MED ORDER — TAMSULOSIN HCL 0.4 MG PO CAPS
0.8000 mg | ORAL_CAPSULE | Freq: Every day | ORAL | Status: DC
  Administered 2020-08-13 – 2020-08-15 (×3): 0.8 mg via ORAL
  Filled 2020-08-12 (×3): qty 2

## 2020-08-12 MED ORDER — ONDANSETRON HCL 4 MG PO TABS: 4.0000 mg | ORAL_TABLET | Freq: Four times a day (QID) | ORAL | Status: DC | PRN

## 2020-08-12 MED ORDER — LORAZEPAM 2 MG/ML IJ SOLN: 1.0000 mg | INTRAMUSCULAR | Status: DC | PRN

## 2020-08-12 MED ORDER — LORAZEPAM 2 MG/ML IJ SOLN: 0.5000 mg | Freq: Once | INTRAMUSCULAR | Status: DC

## 2020-08-12 MED ORDER — QUETIAPINE FUMARATE 50 MG PO TABS
50.0000 mg | ORAL_TABLET | Freq: Every day | ORAL | Status: DC
  Administered 2020-08-12 – 2020-08-15 (×4): 50 mg via ORAL
  Filled 2020-08-12 (×4): qty 1

## 2020-08-12 MED ORDER — LEVETIRACETAM IN NACL 1000 MG/100ML IV SOLN
1000.0000 mg | INTRAVENOUS | Status: AC
  Administered 2020-08-12 (×2): 1000 mg via INTRAVENOUS
  Filled 2020-08-12 (×2): qty 100
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
## 2020-08-12 NOTE — Progress Notes (Signed)
CTH reviewed, no acute findings, stable post-operative findings. I don't anticipate the patient needing any surgical intervention for his seizures, therefore no need to transfer to main campus specifically for neurosurgical management, defer management of seizures to pt's primary care team. Pt should follow up with me in clinic a few weeks after discharge.

## 2020-08-12 NOTE — ED Notes (Signed)
Not able to place pt on continuous cardiac monitor, pt in hallway.

## 2020-08-12 NOTE — ED Notes (Signed)
Date and time results received: 08/12/20 1806  Test: Covid Critical Value: Positive  Name of Provider Notified: Dr. Denton Brick  Orders Received? Or Actions Taken?: Acknowledged - per provider, does not need isolation d/t testing + at home 14 days ago.
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
## 2020-08-12 NOTE — ED Notes (Signed)
Pt is now able to tell Lab Tech his name and birthday.

## 2020-08-12 NOTE — ED Triage Notes (Signed)
Pt brought in by EMS from home.  Witnessed seizure by wife.  Seizure lasting about one min.  EMS reports pt being restless and confused..  Wife reports pt not sleeping well.  Was seen by Physical Therapy at home today.  History of brain tumor removal but not seizures.  Pt has deficient to right arm from brain tumor.  Pt was + for COVID  07/26/2020.

## 2020-08-13 ENCOUNTER — Observation Stay (HOSPITAL_COMMUNITY): Payer: Medicare Other

## 2020-08-13 DIAGNOSIS — K219 Gastro-esophageal reflux disease without esophagitis: Secondary | ICD-10-CM | POA: Diagnosis present

## 2020-08-13 DIAGNOSIS — G9341 Metabolic encephalopathy: Secondary | ICD-10-CM | POA: Diagnosis present

## 2020-08-13 DIAGNOSIS — B9620 Unspecified Escherichia coli [E. coli] as the cause of diseases classified elsewhere: Secondary | ICD-10-CM | POA: Diagnosis present

## 2020-08-13 DIAGNOSIS — R569 Unspecified convulsions: Secondary | ICD-10-CM | POA: Diagnosis present

## 2020-08-13 DIAGNOSIS — E669 Obesity, unspecified: Secondary | ICD-10-CM | POA: Diagnosis present

## 2020-08-13 DIAGNOSIS — R451 Restlessness and agitation: Secondary | ICD-10-CM | POA: Diagnosis present

## 2020-08-13 DIAGNOSIS — Z85828 Personal history of other malignant neoplasm of skin: Secondary | ICD-10-CM | POA: Diagnosis not present

## 2020-08-13 DIAGNOSIS — N390 Urinary tract infection, site not specified: Secondary | ICD-10-CM | POA: Diagnosis present

## 2020-08-13 DIAGNOSIS — Z7401 Bed confinement status: Secondary | ICD-10-CM | POA: Diagnosis not present

## 2020-08-13 DIAGNOSIS — R059 Cough, unspecified: Secondary | ICD-10-CM | POA: Diagnosis present

## 2020-08-13 DIAGNOSIS — Z6834 Body mass index (BMI) 34.0-34.9, adult: Secondary | ICD-10-CM | POA: Diagnosis not present

## 2020-08-13 DIAGNOSIS — G9340 Encephalopathy, unspecified: Secondary | ICD-10-CM | POA: Diagnosis not present

## 2020-08-13 DIAGNOSIS — G9389 Other specified disorders of brain: Secondary | ICD-10-CM | POA: Diagnosis present

## 2020-08-13 DIAGNOSIS — R32 Unspecified urinary incontinence: Secondary | ICD-10-CM | POA: Diagnosis present

## 2020-08-13 DIAGNOSIS — G4733 Obstructive sleep apnea (adult) (pediatric): Secondary | ICD-10-CM | POA: Diagnosis present

## 2020-08-13 DIAGNOSIS — J45909 Unspecified asthma, uncomplicated: Secondary | ICD-10-CM | POA: Diagnosis present

## 2020-08-13 DIAGNOSIS — Z888 Allergy status to other drugs, medicaments and biological substances status: Secondary | ICD-10-CM | POA: Diagnosis not present

## 2020-08-13 DIAGNOSIS — E538 Deficiency of other specified B group vitamins: Secondary | ICD-10-CM | POA: Diagnosis present

## 2020-08-13 DIAGNOSIS — Z86011 Personal history of benign neoplasm of the brain: Secondary | ICD-10-CM | POA: Diagnosis not present

## 2020-08-13 DIAGNOSIS — F05 Delirium due to known physiological condition: Secondary | ICD-10-CM | POA: Diagnosis present

## 2020-08-13 DIAGNOSIS — Z86718 Personal history of other venous thrombosis and embolism: Secondary | ICD-10-CM | POA: Diagnosis not present

## 2020-08-13 DIAGNOSIS — Z7901 Long term (current) use of anticoagulants: Secondary | ICD-10-CM | POA: Diagnosis not present

## 2020-08-13 DIAGNOSIS — U071 COVID-19: Secondary | ICD-10-CM | POA: Diagnosis present

## 2020-08-13 DIAGNOSIS — Z79899 Other long term (current) drug therapy: Secondary | ICD-10-CM | POA: Diagnosis not present

## 2020-08-13 DIAGNOSIS — I10 Essential (primary) hypertension: Secondary | ICD-10-CM | POA: Diagnosis present

## 2020-08-13 LAB — BASIC METABOLIC PANEL
Anion gap: 8 (ref 5–15)
BUN: 11 mg/dL (ref 8–23)
CO2: 28 mmol/L (ref 22–32)
Calcium: 9.2 mg/dL (ref 8.9–10.3)
Chloride: 101 mmol/L (ref 98–111)
Creatinine, Ser: 0.91 mg/dL (ref 0.61–1.24)
GFR, Estimated: >60 mL/min (ref >60)
Glucose, Bld: 103 mg/dL — ABNORMAL HIGH (ref 70–99)
Potassium: 3.6 mmol/L (ref 3.5–5.1)
Sodium: 137 mmol/L (ref 135–145)

## 2020-08-13 LAB — URINALYSIS, ROUTINE W REFLEX MICROSCOPIC
Bilirubin Urine: NEGATIVE
Glucose, UA: NEGATIVE mg/dL
Hgb urine dipstick: NEGATIVE
Ketones, ur: NEGATIVE mg/dL
Nitrite: NEGATIVE
Protein, ur: NEGATIVE mg/dL
Specific Gravity, Urine: 1.009 (ref 1.005–1.030)
WBC, UA: >50 WBC/hpf — ABNORMAL HIGH (ref 0–5)
pH: 6 (ref 5.0–8.0)

## 2020-08-13 LAB — PHOSPHORUS
Phosphorus: 3.1 mg/dL (ref 2.5–4.6)
Phosphorus: 3.7 mg/dL (ref 2.5–4.6)

## 2020-08-13 LAB — CBC
HCT: 39.4 % (ref 39.0–52.0)
Hemoglobin: 12.7 g/dL — ABNORMAL LOW (ref 13.0–17.0)
MCH: 29.1 pg (ref 26.0–34.0)
MCHC: 32.2 g/dL (ref 30.0–36.0)
MCV: 90.2 fL (ref 80.0–100.0)
Platelets: 239 10*3/uL (ref 150–400)
RBC: 4.37 MIL/uL (ref 4.22–5.81)
RDW: 13.5 % (ref 11.5–15.5)
WBC: 6.9 10*3/uL (ref 4.0–10.5)
nRBC: 0 % (ref 0.0–0.2)

## 2020-08-13 LAB — MAGNESIUM
Magnesium: 2.2 mg/dL (ref 1.7–2.4)
Magnesium: 2.1 mg/dL (ref 1.7–2.4)

## 2020-08-13 MED ORDER — HYDROXYZINE HCL 25 MG PO TABS
25.0000 mg | ORAL_TABLET | Freq: Three times a day (TID) | ORAL | Status: DC | PRN
  Administered 2020-08-13 – 2020-08-15 (×5): 25 mg via ORAL
  Filled 2020-08-13 (×5): qty 1

## 2020-08-13 NOTE — Plan of Care (Signed)

## 2020-08-13 NOTE — Progress Notes (Signed)
Pt on unit Aox2, confused and continuously pulling off telemetry leads.

## 2020-08-13 NOTE — Procedures (Signed)
Patient Name: Anthony Downs  MRN: [id number]  Epilepsy Attending: [name]  Referring Physician/Provider: Dr Jenetta Downer  Date: 08/13/2020 Duration: 23.52 mins  Patient history: 70 year old who has a history of peripheral hemangioma status postresection among other comorbidities, brought in for evaluation of witnessed seizure activity by the wife as well as continuing personality changes and altered mental status ever since surgery. EEG to evaluate for seizure  Level of alertness: Awake, asleep  AEDs during EEG study: LEV  Technical aspects: This EEG study was done with scalp electrodes positioned according to the 10-20 International system of electrode placement. Electrical activity was acquired at a sampling rate of '500Hz'$  and reviewed with a high frequency filter of '70Hz'$  and a low frequency filter of '1Hz'$ . EEG data were recorded continuously and digitally stored.   Description: The posterior dominant rhythm consists of 8-'9Hz'$  activity of moderate voltage (25-35 uV) seen predominantly in posterior head regions, symmetric and reactive to eye opening and eye closing. Sleep was characterized by vertex waves, sleep spindles (12 to 14 Hz), maximal frontocentral region. EEG showed continuous sharply contoured 3 to 6 Hz theta-delta slowing in left frontocentral region. Intermittent generalized 3-'6hz'$  theta-delta slowing was also noted. Hyperventilation and photic stimulation were not performed.     ABNORMALITY - Continuous slow, left frontocentral region - Intermittent slow, generalized  IMPRESSION: This study is suggestive of cortical dysfunction arising from left frontocentral region suggestive of underlying structural abnormality. There is also mild diffuse encephalopathy, nonspecific etiology. No seizures or definite epileptiform discharges were seen throughout the recording.  Danelle Curiale Barbra Sarks

## 2020-08-13 NOTE — Plan of Care (Signed)
?  Problem: Education: ?Goal: Expressions of having a comfortable level of knowledge regarding the disease process will increase ?Outcome: Progressing ?  ?Problem: Coping: ?Goal: Ability to adjust to condition or change in health will improve ?Outcome: Progressing ?Goal: Ability to identify appropriate support needs will improve ?Outcome: Progressing ?  ?Problem: Health Behavior/Discharge Planning: ?Goal: Compliance with prescribed medication regimen will improve ?Outcome: Progressing ?  ?Problem: Medication: ?Goal: Risk for medication side effects will decrease ?Outcome: Progressing ?  ?Problem: Clinical Measurements: ?Goal: Complications related to the disease process, condition or treatment will be avoided or minimized ?Outcome: Progressing ?Goal: Diagnostic test results will improve ?Outcome: Progressing ?  ?

## 2020-08-13 NOTE — Progress Notes (Signed)
EEG complete - results pending 

## 2020-08-13 NOTE — Telephone Encounter (Signed)
Spoke with pt wife and let her know that pt shouldn't need an x-ray. If he has a new injury then it may be requested. Wife states she would like to cancel appointment at this time, pt is currently admitted after having a seizure yesterday.

## 2020-08-13 NOTE — Progress Notes (Signed)
Progress Note    Anthony Downs  [id number] DOB: September 30, 1950  DOA: 08/12/2020 PCP: Leeanne Rio, MD    Brief Narrative:     Medical records reviewed and are as summarized below:  Anthony Downs is an 70 y.o. male with medical history significant for hypertension, OSA, DVT, meningioma status post left craniectomy.  Patient was brought to the ED via EMS with reports of witnessed seizure by patient's wife.  Assessment/Plan:   Principal Problem:   Seizures ([place]) Active Problems:   Brain tumor ([place])   Essential hypertension   Status post craniectomy   DVT (deep venous thrombosis) ([facility])   COVID-19 virus infection   Seizures, with metabolic encephalopathy- recent hospitalization with metabolic encephalopathy, extensive work-up unrevealing, now presenting with new onset seizures.    - EDP neurosurgeon Dr. Zada Finders, does not anticipate any surgical intervention for his seizures, and to follow-up in clinic, no need for transfer, but Wife requested admission to [facility] -Obtain EEG - MRI Brain - IV Keppra 1000 mg given, continue 500 twice daily -neurology consulted -Seizure precautions -Resume Seroquel -defer hydroxyzine stoppage to neurology (patient has been since April and takes for "fidgeting"   ? 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 Venetia Constable 04/29/2020. Has chronic right-sided weakness- present since surgery.   Recent COVID infection- patient tested positive for COVID ~ 07/26/2020, > 2 weeks ago.  Per spouse he had a fever only, no other symptoms.  He is vaccinated x2 and had the booster also.  He is on room air, and chest x-ray is unremarkable. -He is outside the window for COVID-19 treatment, continue routine precautions -COVID test positive today, persistent positive test from 7/9.   History of DVT -Resume Xarelto   Hypertension -Resume  propranolol, tamsulosin  obesity Body mass index is 34.64 kg/m.   Family Communication/Anticipated D/C date and plan/Code Status   DVT prophylaxis: xarelto Code Status: Full Code.  Family Communication: wife at bedside Disposition Plan: Status is: Observation  The patient will require care spanning > 2 midnights and should be moved to inpatient because: Inpatient level of care appropriate due to severity of illness  Dispo: The patient is from: Home              Anticipated d/c is to: Home              Patient currently is not medically stable to d/c.   Difficult to place patient No         Medical Consultants:   Neurology     Subjective:   Wants to go home  Objective:    Vitals:   08/12/20 2357 08/13/20 0315 08/13/20 0816 08/13/20 1300  BP: (!) 143/82  (!) 150/86 135/70  Pulse: 80  66 74  Resp: '18  16 16  '$ Temp: 98 F (36.7 C) 98 F (36.7 C) 98.5 F (36.9 C) 98.1 F (36.7 C)  TempSrc: Oral Oral Oral Oral  SpO2: 98%  99% 96%  Weight:      Height:        Intake/Output Summary (Last 24 hours) at 08/13/2020 1334 Last data filed at 08/13/2020 0824 Gross per 24 hour  Intake 245 ml  Output 600 ml  Net -355 ml   Filed Weights   08/12/20 1229 08/12/20 2236  Weight: 117.9 kg 119.1 kg    Exam:  General: Appearance:    Obese male  in no acute distress     Lungs:      respirations unlabored  Heart:    Normal heart rate.     MS:   All extremities are intact.    Neurologic:   Slow to respond but speech clear and understandable       Data Reviewed:   I have personally reviewed following labs and imaging studies:  Labs: Labs show the following:   Basic Metabolic Panel: Recent Labs  Lab 08/12/20 1303 08/12/20 2308 08/13/20 0309  NA 135  --  137  K 4.1  --  3.6  CL 103  --  101  CO2 25  --  28  GLUCOSE 123*  --  103*  BUN 13  --  11  CREATININE 0.83  --  0.91  CALCIUM 8.9  --  9.2  MG  --  2.2 2.1  PHOS  --  3.1 3.7   GFR Estimated  Creatinine Clearance: 100.7 mL/min (by C-G formula based on SCr of 0.91 mg/dL). Liver Function Tests: Recent Labs  Lab 08/12/20 1303  AST 19  ALT 29  ALKPHOS 75  BILITOT 0.7  PROT 6.6  ALBUMIN 3.7   No results for input(s): LIPASE, AMYLASE in the last 168 hours. No results for input(s): AMMONIA in the last 168 hours. Coagulation profile Recent Labs  Lab 08/12/20 1303  INR 1.4*    CBC: Recent Labs  Lab 08/12/20 1303 08/13/20 0309  WBC 7.3 6.9  NEUTROABS 4.6  --   HGB 13.7 12.7*  HCT 42.1 39.4  MCV 91.1 90.2  PLT 248 239   Cardiac Enzymes: No results for input(s): CKTOTAL, CKMB, CKMBINDEX, TROPONINI in the last 168 hours. BNP (last 3 results) No results for input(s): PROBNP in the last 8760 hours. CBG: Recent Labs  Lab 08/12/20 1305  GLUCAP 115*   D-Dimer: No results for input(s): DDIMER in the last 72 hours. Hgb A1c: No results for input(s): HGBA1C in the last 72 hours. Lipid Profile: No results for input(s): CHOL, HDL, LDLCALC, TRIG, CHOLHDL, LDLDIRECT in the last 72 hours. Thyroid function studies: No results for input(s): TSH, T4TOTAL, T3FREE, THYROIDAB in the last 72 hours.  Invalid input(s): FREET3 Anemia work up: No results for input(s): VITAMINB12, FOLATE, FERRITIN, TIBC, IRON, RETICCTPCT in the last 72 hours. Sepsis Labs: Recent Labs  Lab 08/12/20 1303 08/13/20 0309  WBC 7.3 6.9    Microbiology Recent Results (from the past 240 hour(s))  Resp Panel by RT-PCR (Flu A&B, Covid) Nasopharyngeal Swab     Status: Abnormal   Collection Time: 08/12/20  5:10 PM   Specimen: Nasopharyngeal Swab; Nasopharyngeal(NP) swabs in vial transport medium  Result Value Ref Range Status   SARS Coronavirus 2 by RT PCR POSITIVE (A) NEGATIVE Final    Comment: CRITICAL RESULT CALLED TO, READ BACK BY AND VERIFIED WITH: [id number] 08/12/2020 COLEMAN,R (NOTE) SARS-CoV-2 target nucleic acids are DETECTED.  The SARS-CoV-2 RNA is generally detectable in upper  respiratory specimens during the acute phase of infection. Positive results are indicative of the presence of the identified virus, but do not rule out bacterial infection or co-infection with other pathogens not detected by the test. Clinical correlation with patient history and other diagnostic information is necessary to determine patient infection status. The expected result is Negative.  Fact Sheet for Patients: EntrepreneurPulse.com.au  Fact Sheet for Healthcare Providers: IncredibleEmployment.be  This test is not yet approved or cleared by the [place] and  has been authorized for  detection and/or diagnosis of SARS-CoV-2 by FDA under an Emergency Use Authorization (EUA).  This EUA will remain in effect (meaning this t est can be used) for the duration of  the COVID-19 declaration under Section 564(b)(1) of the Act, 21 U.S.C. section 360bbb-3(b)(1), unless the authorization is terminated or revoked sooner.     Influenza A by PCR NEGATIVE NEGATIVE Final   Influenza B by PCR NEGATIVE NEGATIVE Final    Comment: (NOTE) The Xpert Xpress SARS-CoV-2/FLU/RSV plus assay is intended as an aid in the diagnosis of influenza from Nasopharyngeal swab specimens and should not be used as a sole basis for treatment. Nasal washings and aspirates are unacceptable for Xpert Xpress SARS-CoV-2/FLU/RSV testing.  Fact Sheet for Patients: EntrepreneurPulse.com.au  Fact Sheet for Healthcare Providers: IncredibleEmployment.be  This test is not yet approved or cleared by the [place] FDA and has been authorized for detection and/or diagnosis of SARS-CoV-2 by FDA under an Emergency Use Authorization (EUA). This EUA will remain in effect (meaning this test can be used) for the duration of the COVID-19 declaration under Section 564(b)(1) of the Act, 21 U.S.C. section 360bbb-3(b)(1), unless the authorization is  terminated or revoked.  Performed at [facility], [address], [place], [place] [address]     Procedures and diagnostic studies:  DG Chest 1 View  Result Date: 08/12/2020 CLINICAL DATA:  Status post seizure today. EXAM: CHEST  1 VIEW COMPARISON:  None. FINDINGS: Lungs clear. Heart size normal. No pneumothorax or pleural fluid. No acute or focal bony abnormality. IMPRESSION: Negative chest. Electronically Signed   By: Inge Rise M.D.   On: 08/12/2020 14:16   CT HEAD WO CONTRAST  Result Date: 08/12/2020 CLINICAL DATA:  TIA, history of meningioma resection EXAM: CT HEAD WITHOUT CONTRAST TECHNIQUE: Contiguous axial images were obtained from the base of the skull through the vertex without intravenous contrast. COMPARISON:  CT head 07/06/2020 FINDINGS: Brain: Postoperative changes are again identified at the vertex with encephalomalacia of the left greater than right parasagittal frontal lobes. Additional areas of encephalomalacia identified in the anterior left frontal lobe and bilateral anterior temporal lobes. Other patchy low-attenuation in the supratentorial white matter probably reflects similar chronic microvascular ischemic changes. Ventricles and sulci are stable in size and configuration. There is no acute intracranial hemorrhage. No new loss of gray-white differentiation. Vascular: No hyperdense vessel or unexpected calcification. Skull: Calvarium is unremarkable. Sinuses/Orbits: No acute finding. Other: None. IMPRESSION: No acute intracranial abnormality. Similar appearance of postoperative changes. Electronically Signed   By: Macy Mis M.D.   On: 08/12/2020 14:54    Medications:    levETIRAcetam  500 mg Oral BID   pantoprazole  40 mg Oral Daily   propranolol  20 mg Oral TID   QUEtiapine  50 mg Oral QHS   rivaroxaban  20 mg Oral Q supper   tamsulosin  0.8 mg Oral QPC supper   thiamine  100 mg Oral Daily   traZODone  100 mg Oral QHS   vitamin B-12  500 mcg Oral Daily    Continuous Infusions:   LOS: 0 days   Geradine Girt  [facility]   How to contact the [facility] Attending or Consulting provider [place] or covering provider during after hours [place], for this patient?  Check the care team in [facility] and look for a) attending/consulting [facility] provider listed and b) the [facility] team listed Log into [email or web address] and use [place] universal password to access. If you do not have  the password, please contact the hospital operator. Locate the [facility] provider you are looking for under [facility] and page to a number that you can be directly reached. If you still have difficulty reaching the provider, please page the [facility] (Director on Call) for the Hospitalists listed on amion for assistance.  08/13/2020, 1:34 PM

## 2020-08-14 ENCOUNTER — Inpatient Hospital Stay (HOSPITAL_COMMUNITY): Payer: Medicare Other

## 2020-08-14 LAB — BASIC METABOLIC PANEL
Anion gap: 7 (ref 5–15)
BUN: 9 mg/dL (ref 8–23)
CO2: 28 mmol/L (ref 22–32)
Calcium: 9.1 mg/dL (ref 8.9–10.3)
Chloride: 100 mmol/L (ref 98–111)
Creatinine, Ser: 0.92 mg/dL (ref 0.61–1.24)
GFR, Estimated: >60 mL/min (ref >60)
Glucose, Bld: 100 mg/dL — ABNORMAL HIGH (ref 70–99)
Potassium: 3.3 mmol/L — ABNORMAL LOW (ref 3.5–5.1)
Sodium: 135 mmol/L (ref 135–145)

## 2020-08-14 LAB — VITAMIN B12: Vitamin B-12: 556 pg/mL (ref 180–914)

## 2020-08-14 MED ORDER — PROPRANOLOL HCL 10 MG PO TABS
10.0000 mg | ORAL_TABLET | Freq: Three times a day (TID) | ORAL | Status: DC
  Administered 2020-08-14 – 2020-08-16 (×6): 10 mg via ORAL
  Filled 2020-08-14 (×6): qty 1

## 2020-08-14 MED ORDER — GADOBUTROL 1 MMOL/ML IV SOLN
10.0000 mL | Freq: Once | INTRAVENOUS | Status: AC | PRN
  Administered 2020-08-14: 10 mL via INTRAVENOUS

## 2020-08-14 MED ORDER — POTASSIUM CHLORIDE CRYS ER 20 MEQ PO TBCR
40.0000 meq | EXTENDED_RELEASE_TABLET | Freq: Once | ORAL | Status: AC
  Administered 2020-08-14: 40 meq via ORAL
  Filled 2020-08-14: qty 2

## 2020-08-14 NOTE — Progress Notes (Signed)
Progress Note    Anthony Downs  [id number] DOB: 1950/08/13  DOA: 08/12/2020 PCP: Leeanne Rio, MD    Brief Narrative:     Medical records reviewed and are as summarized below:  Anthony Downs is an 70 y.o. male with medical history significant for hypertension, OSA, DVT, meningioma status post left craniectomy.  Patient was brought to the ED via EMS with reports of witnessed seizure by patient's wife.  WOrk up in progress, MRI pending.    Assessment/Plan:   Principal Problem:   Seizures ([place]) Active Problems:   Brain tumor ([place])   Essential hypertension   Status post craniectomy   DVT (deep venous thrombosis) (HCC)   COVID-19 virus infection   Seizure (HCC)   Seizures, with metabolic encephalopathy- recent hospitalization with metabolic encephalopathy, extensive work-up unrevealing, now presenting with new onset seizures.    - EDP neurosurgeon Dr. Zada Finders, does not anticipate any surgical intervention for his seizures, and to follow-up in clinic, no need for transfer, but Wife requested admission to [facility] -EEG: This study is suggestive of cortical dysfunction arising from left frontocentral region suggestive of underlying structural abnormality. There is also mild diffuse encephalopathy, nonspecific etiology. No seizures or definite epileptiform discharges were seen throughout the recording. - MRI Brain still pending - IV Keppra 1000 mg given, continue 500 twice daily -neurology consulted -Seizure precautions -Resume Seroquel  ? 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 Venetia Constable 04/29/2020. Has chronic right-sided weakness- present since surgery.   Recent COVID infection- patient tested positive for COVID ~ 07/26/2020, > 2 weeks ago.  Per spouse he had a fever only, no other symptoms.  He is vaccinated x2 and had the booster also.  He is on room  air, and chest x-ray is unremarkable. -He is outside the window for COVID-19 treatment, continue routine precautions -COVID test positive today, persistent positive test from 7/9.   History of DVT -Resume Xarelto   Hypertension -Resume propranolol, tamsulosin -will decrease propranolol  obesity Body mass index is 34.64 kg/m.   Family Communication/Anticipated D/C date and plan/Code Status   DVT prophylaxis: xarelto Code Status: Full Code.  Family Communication: wife at bedside Disposition Plan: Status is: inpt  The patient will require care spanning > 2 midnights and should be moved to inpatient because: Inpatient level of care appropriate due to severity of illness  Dispo: The patient is from: Home              Anticipated d/c is to: Home              Patient currently is not medically stable to d/c.   Difficult to place patient No         Medical Consultants:   Neurology     Subjective:   Wife says his "fidgety" movements are improved  Objective:    Vitals:   08/14/20 0014 08/14/20 0400 08/14/20 0833 08/14/20 1126  BP: (!) 99/54 106/66 102/65 104/77  Pulse: 68 66 74 (!) 58  Resp: '20 19 16 16  '$ Temp: 98.3 F (36.8 C) 98.6 F (37 C) 98.4 F (36.9 C) 98.1 F (36.7 C)  TempSrc: Oral Axillary Oral Oral  SpO2: 93% 96% 99% 97%  Weight:      Height:        Intake/Output Summary (Last 24 hours) at 08/14/2020 1238 Last data filed at 08/13/2020 2100 Gross per 24  hour  Intake 100 ml  Output --  Net 100 ml   Filed Weights   08/12/20 1229 08/12/20 2236  Weight: 117.9 kg 119.1 kg    Exam:  General: Appearance:    Obese male in no acute distress     Lungs:      respirations unlabored  Heart:    Bradycardic.   MS:   All extremities are intact.    Neurologic:   Will awaken but falls back asleep       Data Reviewed:   I have personally reviewed following labs and imaging studies:  Labs: Labs show the following:   Basic Metabolic  Panel: Recent Labs  Lab 08/12/20 1303 08/12/20 2308 08/13/20 0309 08/14/20 0250  NA 135  --  137 135  K 4.1  --  3.6 3.3*  CL 103  --  101 100  CO2 25  --  28 28  GLUCOSE 123*  --  103* 100*  BUN 13  --  11 9  CREATININE 0.83  --  0.91 0.92  CALCIUM 8.9  --  9.2 9.1  MG  --  2.2 2.1  --   PHOS  --  3.1 3.7  --    GFR Estimated Creatinine Clearance: 99.6 mL/min (by C-G formula based on SCr of 0.92 mg/dL). Liver Function Tests: Recent Labs  Lab 08/12/20 1303  AST 19  ALT 29  ALKPHOS 75  BILITOT 0.7  PROT 6.6  ALBUMIN 3.7   No results for input(s): LIPASE, AMYLASE in the last 168 hours. No results for input(s): AMMONIA in the last 168 hours. Coagulation profile Recent Labs  Lab 08/12/20 1303  INR 1.4*    CBC: Recent Labs  Lab 08/12/20 1303 08/13/20 0309  WBC 7.3 6.9  NEUTROABS 4.6  --   HGB 13.7 12.7*  HCT 42.1 39.4  MCV 91.1 90.2  PLT 248 239   Cardiac Enzymes: No results for input(s): CKTOTAL, CKMB, CKMBINDEX, TROPONINI in the last 168 hours. BNP (last 3 results) No results for input(s): PROBNP in the last 8760 hours. CBG: Recent Labs  Lab 08/12/20 1305  GLUCAP 115*   D-Dimer: No results for input(s): DDIMER in the last 72 hours. Hgb A1c: No results for input(s): HGBA1C in the last 72 hours. Lipid Profile: No results for input(s): CHOL, HDL, LDLCALC, TRIG, CHOLHDL, LDLDIRECT in the last 72 hours. Thyroid function studies: No results for input(s): TSH, T4TOTAL, T3FREE, THYROIDAB in the last 72 hours.  Invalid input(s): FREET3 Anemia work up: Recent Labs    08/14/20 0911  VITAMINB12 556   Sepsis Labs: Recent Labs  Lab 08/12/20 1303 08/13/20 0309  WBC 7.3 6.9    Microbiology Recent Results (from the past 240 hour(s))  Resp Panel by RT-PCR (Flu A&B, Covid) Nasopharyngeal Swab     Status: Abnormal   Collection Time: 08/12/20  5:10 PM   Specimen: Nasopharyngeal Swab; Nasopharyngeal(NP) swabs in vial transport medium  Result Value Ref  Range Status   SARS Coronavirus 2 by RT PCR POSITIVE (A) NEGATIVE Final    Comment: CRITICAL RESULT CALLED TO, READ BACK BY AND VERIFIED WITH: [id number] 08/12/2020 COLEMAN,R (NOTE) SARS-CoV-2 target nucleic acids are DETECTED.  The SARS-CoV-2 RNA is generally detectable in upper respiratory specimens during the acute phase of infection. Positive results are indicative of the presence of the identified virus, but do not rule out bacterial infection or co-infection with other pathogens not detected by the test. Clinical correlation with patient history and other  diagnostic information is necessary to determine patient infection status. The expected result is Negative.  Fact Sheet for Patients: EntrepreneurPulse.com.au  Fact Sheet for Healthcare Providers: IncredibleEmployment.be  This test is not yet approved or cleared by the [place] FDA and  has been authorized for detection and/or diagnosis of SARS-CoV-2 by FDA under an Emergency Use Authorization (EUA).  This EUA will remain in effect (meaning this t est can be used) for the duration of  the COVID-19 declaration under Section 564(b)(1) of the Act, 21 U.S.C. section 360bbb-3(b)(1), unless the authorization is terminated or revoked sooner.     Influenza A by PCR NEGATIVE NEGATIVE Final   Influenza B by PCR NEGATIVE NEGATIVE Final    Comment: (NOTE) The Xpert Xpress SARS-CoV-2/FLU/RSV plus assay is intended as an aid in the diagnosis of influenza from Nasopharyngeal swab specimens and should not be used as a sole basis for treatment. Nasal washings and aspirates are unacceptable for Xpert Xpress SARS-CoV-2/FLU/RSV testing.  Fact Sheet for Patients: EntrepreneurPulse.com.au  Fact Sheet for Healthcare Providers: IncredibleEmployment.be  This test is not yet approved or cleared by the [place] FDA and has been authorized for detection and/or  diagnosis of SARS-CoV-2 by FDA under an Emergency Use Authorization (EUA). This EUA will remain in effect (meaning this test can be used) for the duration of the COVID-19 declaration under Section 564(b)(1) of the Act, 21 U.S.C. section 360bbb-3(b)(1), unless the authorization is terminated or revoked.  Performed at [facility], [address] [place], [place] [address]     Procedures and diagnostic studies:  DG Chest 1 View  Result Date: 08/12/2020 CLINICAL DATA:  Status post seizure today. EXAM: CHEST  1 VIEW COMPARISON:  None. FINDINGS: Lungs clear. Heart size normal. No pneumothorax or pleural fluid. No acute or focal bony abnormality. IMPRESSION: Negative chest. Electronically Signed   By: Inge Rise M.D.   On: 08/12/2020 14:16   CT HEAD WO CONTRAST  Result Date: 08/12/2020 CLINICAL DATA:  TIA, history of meningioma resection EXAM: CT HEAD WITHOUT CONTRAST TECHNIQUE: Contiguous axial images were obtained from the base of the skull through the vertex without intravenous contrast. COMPARISON:  CT head 07/06/2020 FINDINGS: Brain: Postoperative changes are again identified at the vertex with encephalomalacia of the left greater than right parasagittal frontal lobes. Additional areas of encephalomalacia identified in the anterior left frontal lobe and bilateral anterior temporal lobes. Other patchy low-attenuation in the supratentorial white matter probably reflects similar chronic microvascular ischemic changes. Ventricles and sulci are stable in size and configuration. There is no acute intracranial hemorrhage. No new loss of gray-white differentiation. Vascular: No hyperdense vessel or unexpected calcification. Skull: Calvarium is unremarkable. Sinuses/Orbits: No acute finding. Other: None. IMPRESSION: No acute intracranial abnormality. Similar appearance of postoperative changes. Electronically Signed   By: Macy Mis M.D.   On: 08/12/2020 14:54   EEG adult  Result Date:  08/13/2020 [name], MD     08/13/2020  6:08 PM Patient Name: Anthony Downs MRN: [id number] Epilepsy Attending: [name] Referring Physician/Provider: Dr Jenetta Downer Date: 08/13/2020 Duration: 23.52 mins Patient history: 70 year old who has a history of peripheral hemangioma status postresection among other comorbidities, brought in for evaluation of witnessed seizure activity by the wife as well as continuing personality changes and altered mental status ever since surgery. EEG to evaluate for seizure Level of alertness: Awake, asleep AEDs during EEG study: LEV Technical aspects: This EEG study was done with scalp electrodes positioned according to the 10-20 International  system of electrode placement. Electrical activity was acquired at a sampling rate of '500Hz'$  and reviewed with a high frequency filter of '70Hz'$  and a low frequency filter of '1Hz'$ . EEG data were recorded continuously and digitally stored. Description: The posterior dominant rhythm consists of 8-'9Hz'$  activity of moderate voltage (25-35 uV) seen predominantly in posterior head regions, symmetric and reactive to eye opening and eye closing. Sleep was characterized by vertex waves, sleep spindles (12 to 14 Hz), maximal frontocentral region. EEG showed continuous sharply contoured 3 to 6 Hz theta-delta slowing in left frontocentral region. Intermittent generalized 3-'6hz'$  theta-delta slowing was also noted. Hyperventilation and photic stimulation were not performed.   ABNORMALITY - Continuous slow, left frontocentral region - Intermittent slow, generalized IMPRESSION: This study is suggestive of cortical dysfunction arising from left frontocentral region suggestive of underlying structural abnormality. There is also mild diffuse encephalopathy, nonspecific etiology. No seizures or definite epileptiform discharges were seen throughout the recording. Priyanka Barbra Sarks    Medications:    levETIRAcetam  500 mg Oral BID    pantoprazole  40 mg Oral Daily   propranolol  20 mg Oral TID   QUEtiapine  50 mg Oral QHS   rivaroxaban  20 mg Oral Q supper   tamsulosin  0.8 mg Oral QPC supper   thiamine  100 mg Oral Daily   traZODone  100 mg Oral QHS   vitamin B-12  500 mcg Oral Daily   Continuous Infusions:   LOS: 1 day   Geradine Girt  [facility]   How to contact the [facility] Attending or Consulting provider Center or covering provider during after hours [place], for this patient?  Check the care team in [facility] and look for a) attending/consulting TRH provider listed and b) the [facility] team listed Log into [email or web address] and use [place]'s universal password to access. If you do not have the password, please contact the hospital operator. Locate the [facility] provider you are looking for under [facility] and page to a number that you can be directly reached. If you still have difficulty reaching the provider, please page the [facility] (Director on Call) for the Hospitalists listed on amion for assistance.  08/14/2020, 12:38 PM

## 2020-08-14 NOTE — Progress Notes (Addendum)
Subjective:  No seizure like activity overnight.  Nursing staff and pt's wife report ongoing restlessness.   Pt's wife is at bedside this morning. She reports that he has been bedbound since his surgery in April. Prior to that, he was functioning normally--able to walk, talk, perform all ADLs. Around February, he developed some difficulty lifting his right leg, which was what prompted MRI imaging and finding the meningioma.  He is incontinent of urine which is new over the past couple of weeks.  She notes that the seizure lasted until shortly before EMS arrived which she estimates to be around 5-10 minutes.  Objective: Current vital signs: BP 106/66 (BP Location: Left Arm)   Pulse 66   Temp 98.6 F (37 C) (Axillary)   Resp 19   Ht '6\' 1"'$  (1.854 m)   Wt 119.1 kg   SpO2 96%   BMI 34.64 kg/m  Vital signs in last 24 hours: Temp:  [97.8 F (36.6 C)-98.6 F (37 C)] 98.6 F (37 C) (07/28 0400) Pulse Rate:  [66-74] 66 (07/28 0400) Resp:  [16-20] 19 (07/28 0400) BP: (99-135)/(54-70) 106/66 (07/28 0400) SpO2:  [93 %-96 %] 96 % (07/28 0400)  Intake/Output from previous day: 07/27 0701 - 07/28 0700 In: 100 [P.O.:100] Out: 600 [Urine:600] Intake/Output this shift: No intake/output data recorded. Nutritional status:  Diet Order             Diet Heart Room service appropriate? Yes; Fluid consistency: Thin  Diet effective now                  General: elderly appearing gentleman laying in bed, fidgeting restlessly, chewing on the bed sheets   Neurologic Exam: Mental status: awake. Does not respond to orientation questions. Inconsistently follows commands. When he does follow them, they are delayed. Poor attention Cranial nerves: PERRL. EOMs intact. Peripheral vision intact. Symmetric smile. Hearing intact. Shoulder shrug equal bilaterally Motor: 3/5 strength in the RUE with increased tone. 5/5 strength in the left upper extremity. Did not follow commands for lower extremity  assessment. Right hand tremor present (noted to be chronic by wife). Bilateral asterixis.  Sensory: unable to assess Cerebellar: normal finger to nose bilaterally. Right hand tremor improves with intention.  Lab Results: CBC Latest Ref Rng & Units 08/13/2020 08/12/2020 07/15/2020  WBC 4.0 - 10.5 K/uL 6.9 7.3 11.1(H)  Hemoglobin 13.0 - 17.0 g/dL 12.7(L) 13.7 12.6(L)  Hematocrit 39.0 - 52.0 % 39.4 42.1 40.1  Platelets 150 - 400 K/uL 239 248 201   BMP Latest Ref Rng & Units 08/14/2020 08/13/2020 08/12/2020  Glucose 70 - 99 mg/dL 100(H) 103(H) 123(H)  BUN 8 - 23 mg/dL '9 11 13  '$ Creatinine 0.61 - 1.24 mg/dL 0.92 0.91 0.83  Sodium 135 - 145 mmol/L 135 137 135  Potassium 3.5 - 5.1 mmol/L 3.3(L) 3.6 4.1  Chloride 98 - 111 mmol/L 100 101 103  CO2 22 - 32 mmol/L '28 28 25  '$ Calcium 8.9 - 10.3 mg/dL 9.1 9.2 8.9     Studies/Results: DG Chest 1 View  Result Date: 08/12/2020 CLINICAL DATA:  Status post seizure today. EXAM: CHEST  1 VIEW COMPARISON:  None. FINDINGS: Lungs clear. Heart size normal. No pneumothorax or pleural fluid. No acute or focal bony abnormality. IMPRESSION: Negative chest. Electronically Signed   By: Inge Rise M.D.   On: 08/12/2020 14:16   CT HEAD WO CONTRAST  Result Date: 08/12/2020 CLINICAL DATA:  TIA, history of meningioma resection EXAM: CT HEAD WITHOUT CONTRAST TECHNIQUE: Contiguous  axial images were obtained from the base of the skull through the vertex without intravenous contrast. COMPARISON:  CT head 07/06/2020 FINDINGS: Brain: Postoperative changes are again identified at the vertex with encephalomalacia of the left greater than right parasagittal frontal lobes. Additional areas of encephalomalacia identified in the anterior left frontal lobe and bilateral anterior temporal lobes. Other patchy low-attenuation in the supratentorial white matter probably reflects similar chronic microvascular ischemic changes. Ventricles and sulci are stable in size and configuration. There  is no acute intracranial hemorrhage. No new loss of gray-white differentiation. Vascular: No hyperdense vessel or unexpected calcification. Skull: Calvarium is unremarkable. Sinuses/Orbits: No acute finding. Other: None. IMPRESSION: No acute intracranial abnormality. Similar appearance of postoperative changes. Electronically Signed   By: Macy Mis M.D.   On: 08/12/2020 14:54   EEG adult  Result Date: 08/13/2020 [name], MD     08/13/2020  6:08 PM Patient Name: Anthony Downs MRN: [id number] Epilepsy Attending: [name] Referring Physician/Provider: Dr Jenetta Downer Date: 08/13/2020 Duration: 23.52 mins Patient history: 70 year old who has a history of peripheral hemangioma status postresection among other comorbidities, brought in for evaluation of witnessed seizure activity by the wife as well as continuing personality changes and altered mental status ever since surgery. EEG to evaluate for seizure Level of alertness: Awake, asleep AEDs during EEG study: LEV Technical aspects: This EEG study was done with scalp electrodes positioned according to the 10-20 International system of electrode placement. Electrical activity was acquired at a sampling rate of '500Hz'$  and reviewed with a high frequency filter of '70Hz'$  and a low frequency filter of '1Hz'$ . EEG data were recorded continuously and digitally stored. Description: The posterior dominant rhythm consists of 8-'9Hz'$  activity of moderate voltage (25-35 uV) seen predominantly in posterior head regions, symmetric and reactive to eye opening and eye closing. Sleep was characterized by vertex waves, sleep spindles (12 to 14 Hz), maximal frontocentral region. EEG showed continuous sharply contoured 3 to 6 Hz theta-delta slowing in left frontocentral region. Intermittent generalized 3-'6hz'$  theta-delta slowing was also noted. Hyperventilation and photic stimulation were not performed.   ABNORMALITY - Continuous slow, left frontocentral region -  Intermittent slow, generalized IMPRESSION: This study is suggestive of cortical dysfunction arising from left frontocentral region suggestive of underlying structural abnormality. There is also mild diffuse encephalopathy, nonspecific etiology. No seizures or definite epileptiform discharges were seen throughout the recording. [name]     Assessment:  70 year old male with hypertension, OSA, DVT and meningioma s/p bifrontal crainiectomy (April 2022) who was brought in for evaluation after witnessed seizure activity by his wife.  -7/26 CT head: no acute abnormalities -7/26 EEG: no seizure or epileptiform activity; findings suggestive of cortical dysfunction arising from the left frontocentral region; mild diffuse encephalopathy -Brain MRI pending -B12 normal  Impression:   New onset seizure in the setting of bifrontal encephalomalacia secondary to meningoma s/p bifrontal craniectomy (April 2022). Question if UTI or recent COVID infection may have been an inciting factor.  -no epileptiform activity on EEG yesterday -no further seizure activity overnight  RUE weakness. His wife notes that this is new since admission. MRI will be helpful for further evaluation of this.   Bilateral asterixis. LFTs and renal function normal. It does not look like he received any benzodiazepines by EMS, none given here. I do not see literature supporting keppra as a cause of asterixis, although other anticonvulsants can.   Plan: -continue keppra '500mg'$  twice daily -seizure precautions  Other Problems -  Hypertension -History of DVT on xarelto    LOS: 1 day   Mitzi Hansen, MD Internal Medicine Resident PGY-3 [facility] Pager: [phone] 08/14/2020 8:49 AM      Electronically signed: Dr. Kerney Elbe

## 2020-08-14 NOTE — TOC Initial Note (Signed)
Transition of Care [facility]) - Initial/Assessment Note    Patient Details  Name: Anthony Downs MRN: [id number] Date of Birth: 09/01/1950  Transition of Care [facility]) CM/SW Contact:    Kermit Balo, RN Phone Number: 08/14/2020, 1:13 PM  Clinical Narrative:                 CM met with the patient and his wife. Wife states they are active with [facility]. She has needed DME and states they have been approved for a power wheelchair.  She uses [place] for transportation but it is expensive per trip. They are also in the process of acquiring a wheelchair van to assist with transport. Pt will need PTAR home when medically ready for d/c.  TOC following.  Expected Discharge Plan: Home w Home Health Services Barriers to Discharge: Continued Medical Work up   Patient Goals and CMS Choice   CMS Medicare.gov Compare Post Acute Care list provided to:: Patient Represenative (must comment) Choice offered to / list presented to : Spouse  Expected Discharge Plan and Services Expected Discharge Plan: Home w Home Health Services   Discharge Planning Services: CM Consult   Living arrangements for the past 2 months: Single Family Home                                      Prior Living Arrangements/Services Living arrangements for the past 2 months: Single Family Home Lives with:: Spouse Patient language and need for interpreter reviewed:: Yes Do you feel safe going back to the place where you live?: Yes      Need for Family Participation in Patient Care: Yes (Comment) Care giver support system in place?: Yes (comment) Current home services: DME (3 n 1/ wheelchair/ steady/ hospital bed/ [facility]) Criminal Activity/Legal Involvement Pertinent to Current Situation/Hospitalization: No - Comment as needed  Activities of Daily Living   ADL Screening (condition at time of admission) Patient's cognitive ability adequate to safely complete daily activities?: No  Permission  Sought/Granted                  Emotional Assessment Appearance:: Appears stated age     Orientation: : Oriented to Self   Psych Involvement: No (comment)  Admission diagnosis:  Cough [R05.9] Seizures (HCC) [R56.9] Acute encephalopathy [G93.40] Seizure (HCC) [R56.9] Patient Active Problem List   Diagnosis Date Noted   Seizure (HCC) 08/13/2020   Seizures (HCC) 08/12/2020   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   DVT (deep venous thrombosis) (HCC) 08/12/2020   COVID-19 virus infection 08/12/2020   Acute lower UTI 07/14/2020   Delirium 07/06/2020   Sleep apnea 07/06/2020   GERD (gastroesophageal reflux disease) 07/06/2020   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 [place] [address] Phone: [phone] Fax: [phone]     Social Determinants of Health (SDOH) Interventions  Readmission Risk Interventions No flowsheet data found.   

## 2020-08-15 LAB — BASIC METABOLIC PANEL
Anion gap: 4 — ABNORMAL LOW (ref 5–15)
BUN: 8 mg/dL (ref 8–23)
CO2: 30 mmol/L (ref 22–32)
Calcium: 9 mg/dL (ref 8.9–10.3)
Chloride: 105 mmol/L (ref 98–111)
Creatinine, Ser: 0.94 mg/dL (ref 0.61–1.24)
GFR, Estimated: >60 mL/min (ref >60)
Glucose, Bld: 124 mg/dL — ABNORMAL HIGH (ref 70–99)
Potassium: 3.9 mmol/L (ref 3.5–5.1)
Sodium: 139 mmol/L (ref 135–145)

## 2020-08-15 MED ORDER — CEPHALEXIN 500 MG PO CAPS
500.0000 mg | ORAL_CAPSULE | Freq: Two times a day (BID) | ORAL | Status: DC
  Administered 2020-08-15 – 2020-08-16 (×3): 500 mg via ORAL
  Filled 2020-08-15 (×3): qty 1

## 2020-08-15 MED ORDER — POLYETHYLENE GLYCOL 3350 17 G PO PACK
17.0000 g | PACK | Freq: Two times a day (BID) | ORAL | Status: DC | PRN
  Administered 2020-08-15: 17 g via ORAL
  Filled 2020-08-15: qty 1

## 2020-08-15 NOTE — Plan of Care (Signed)
  Problem: Education: Goal: Expressions of having a comfortable level of knowledge regarding the disease process will increase Outcome: Progressing   Problem: Coping: Goal: Ability to adjust to condition or change in health will improve Outcome: Progressing Goal: Ability to identify appropriate support needs will improve Outcome: Progressing   Problem: Health Behavior/Discharge Planning: Goal: Compliance with prescribed medication regimen will improve Outcome: Progressing   Problem: Medication: Goal: Risk for medication side effects will decrease Outcome: Progressing   Problem: Clinical Measurements: Goal: Complications related to the disease process, condition or treatment will be avoided or minimized Outcome: Progressing Goal: Diagnostic test results will improve Outcome: Progressing   Problem: Safety: Goal: Verbalization of understanding the information provided will improve Outcome: Progressing   Problem: Self-Concept: Goal: Level of anxiety will decrease Outcome: Progressing Goal: Ability to verbalize feelings about condition will improve Outcome: Progressing   .me

## 2020-08-15 NOTE — Care Management Important Message (Signed)
Important Message  Patient Details  Name: Anthony Downs MRN: [id number] Date of Birth: 08-06-1950   Medicare Important Message Given:  Yes     Memory Argue 08/15/2020, 5:07 PM

## 2020-08-15 NOTE — Progress Notes (Addendum)
Subjective:  Brain MRI was completed yesterday afternoon and did not reveal any acute or subacute infarcts. Urine culture grew >100,000 colonies of e.coli No significant overnight events. No witnessed seizure activity.  Wife was at bedside this morning. Discussed with her our opinion that his seizure was likely multifactorial, but that the UTI on top of the post surgical state was likely the trigger. Explained that he will continue with keppra and will need to follow up with neurology after discharge.   Objective: Current vital signs: BP 136/62 (BP Location: Left Arm)   Pulse 70   Temp 98.2 F (36.8 C) (Oral)   Resp 19   Ht '6\' 1"'$  (1.854 m)   Wt 119.1 kg   SpO2 99%   BMI 34.64 kg/m  Vital signs in last 24 hours: Temp:  [98 F (36.7 C)-98.6 F (37 C)] 98.2 F (36.8 C) (07/29 0758) Pulse Rate:  [58-74] 70 (07/29 0758) Resp:  [16-20] 19 (07/29 0758) BP: (101-136)/(62-82) 136/62 (07/29 0758) SpO2:  [96 %-100 %] 99 % (07/29 0758)  Intake/Output from previous day: 07/28 0701 - 07/29 0700 In: -  Out: 200 [Urine:200] Intake/Output this shift: No intake/output data recorded. Nutritional status:  Diet Order             Diet Heart Room service appropriate? Yes; Fluid consistency: Thin  Diet effective now                  General: elderly appearing gentleman, less restless than yesterday   Neurologic Exam: Mental status: awake. Does not respond to orientation questions. Following commands more consistently today. Poor attention. Answers simple yes and no questions.  Cranial nerves:  EOMs intact. Symmetric smile. Hearing intact.  Motor: 3/5 strength in the RUE with increased tone. 5/5 strength in the left upper extremity. Does not raise legs to antigravity bilaterally. Wiggles his left toes to commands, not on the right. Right hand tremor present (noted to be chronic by wife). Sensory: sensation intact throughout.  Cerebellar: normal finger to nose bilaterally. Right hand  tremor improves with intention.  Lab Results: CBC Latest Ref Rng & Units 08/13/2020 08/12/2020 07/15/2020  WBC 4.0 - 10.5 K/uL 6.9 7.3 11.1(H)  Hemoglobin 13.0 - 17.0 g/dL 12.7(L) 13.7 12.6(L)  Hematocrit 39.0 - 52.0 % 39.4 42.1 40.1  Platelets 150 - 400 K/uL 239 248 201   BMP Latest Ref Rng & Units 08/15/2020 08/14/2020 08/13/2020  Glucose 70 - 99 mg/dL 124(H) 100(H) 103(H)  BUN 8 - 23 mg/dL '8 9 11  '$ Creatinine 0.61 - 1.24 mg/dL 0.94 0.92 0.91  Sodium 135 - 145 mmol/L 139 135 137  Potassium 3.5 - 5.1 mmol/L 3.9 3.3(L) 3.6  Chloride 98 - 111 mmol/L 105 100 101  CO2 22 - 32 mmol/L '30 28 28  '$ Calcium 8.9 - 10.3 mg/dL 9.0 9.1 9.2     Studies/Results: MR BRAIN W WO CONTRAST  Result Date: 08/14/2020 CLINICAL DATA:  Brain mass or lesion. History of meningioma resection. EXAM: MRI HEAD WITHOUT AND WITH CONTRAST TECHNIQUE: Multiplanar, multiecho pulse sequences of the brain and surrounding structures were obtained without and with intravenous contrast. CONTRAST:  11m GADAVIST GADOBUTROL 1 MMOL/ML IV SOLN COMPARISON:  Head CT 2 days ago.  MRI 07/07/2020. FINDINGS: Brain: Diffusion imaging does not show any acute or subacute infarction or other cause of restricted diffusion. No focal abnormality affects the brainstem or cerebellum. There is an old left frontal cortical and subcortical infarction with atrophy and encephalomalacia. There are mild chronic  small-vessel ischemic changes of the white matter. There is atrophy, encephalomalacia and gliosis along the medial aspects of both sides of the brain at the frontoparietal vertex, sequela of the previous tumor and subsequent resection. It appears that the midportion of the superior sagittal sinus has been resected. No evidence of tumor in the venous sinuses anterior or posterior to that. Vascular: Major vessels at the base of the brain show flow. Skull and upper cervical spine: Craniectomy and cranioplasty at the vertex. Otherwise negative. Sinuses/Orbits:  Clear/normal Other: None IMPRESSION: No evidence of residual or recurrent meningioma tumor. Vertex craniectomy and cranioplasty for resection of meningioma. Segmental resection of the superior sagittal sinus. No residual or recurrent tumor seen in the region. Electronically Signed   By: [name] M.D.   On: 08/14/2020 14:33   EEG adult  Result Date: 08/13/2020 [name], MD     08/13/2020  6:08 PM Patient Name: Anthony Downs MRN: [id number] Epilepsy Attending: [name] Referring Physician/Provider: Dr Jenetta Downer Date: 08/13/2020 Duration: 23.52 mins Patient history: 70 year old who has a history of peripheral hemangioma status postresection among other comorbidities, brought in for evaluation of witnessed seizure activity by the wife as well as continuing personality changes and altered mental status ever since surgery. EEG to evaluate for seizure Level of alertness: Awake, asleep AEDs during EEG study: LEV Technical aspects: This EEG study was done with scalp electrodes positioned according to the 10-20 International system of electrode placement. Electrical activity was acquired at a sampling rate of '500Hz'$  and reviewed with a high frequency filter of '70Hz'$  and a low frequency filter of '1Hz'$ . EEG data were recorded continuously and digitally stored. Description: The posterior dominant rhythm consists of 8-'9Hz'$  activity of moderate voltage (25-35 uV) seen predominantly in posterior head regions, symmetric and reactive to eye opening and eye closing. Sleep was characterized by vertex waves, sleep spindles (12 to 14 Hz), maximal frontocentral region. EEG showed continuous sharply contoured 3 to 6 Hz theta-delta slowing in left frontocentral region. Intermittent generalized 3-'6hz'$  theta-delta slowing was also noted. Hyperventilation and photic stimulation were not performed.   ABNORMALITY - Continuous slow, left frontocentral region - Intermittent slow, generalized IMPRESSION: This study  is suggestive of cortical dysfunction arising from left frontocentral region suggestive of underlying structural abnormality. There is also mild diffuse encephalopathy, nonspecific etiology. No seizures or definite epileptiform discharges were seen throughout the recording. [name]     Assessment:  70 year old male with hypertension, OSA, DVT and meningioma s/p bifrontal crainiectomy (April 2022) who was brought in for evaluation after witnessed seizure activity by his wife.  -7/26 CT head: no acute abnormalities -7/26 EEG: no seizure or epileptiform activity; findings suggestive of cortical dysfunction arising from the left frontocentral region; mild diffuse encephalopathy -7/28 MRI Brain: no acute or subacute infarcts. 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 Atrophy, encephalomalacia along the medial aspect of the frontoparietal cortex. Mild chronic small vessel ischemic disease. Of note, old imaging studies describe the anterior left frontal lobe signal abnormality as being most consistent with an old traumatic contusion; on personal review of the images by Neurology, this seems to be more likely than an old infarct based on the appearance.  -B12 normal  Impressions:  -New onset seizure in the setting of bifrontal encephalomalacia secondary to meningoma s/p bifrontal craniectomy (April 2022). Suspect UTI was an exacerbating factor. -no further seizure activity witnessed overnight  -Behavioral changes that were noted by his wife are likely  multifactorial, mostly secondary to the chronic anterior left frontal lobe contusion.  -RUE weakness. His wife states that this is new since admission. However, no new infarct on MRI. Chronic cortical/subcortical anterior left frontal lobe contusion versus infarct seen on current scan, also present on review of MRI from 07/07/20, as well as old left frontal convexity encephalomalacia from prior meningioma resection, best explain his RUE  weakness.    Plan: -continue keppra '500mg'$  twice daily -follow up with neurology in 2-4 weeks after discharge. -seizure precautions -continue keflex for UTI  Other Problems -Hypertension -History of DVT on xarelto  Thank you for this consultation and allowing [place] to participate in the care of this patient. Neurology will sign off at this time. Please call us if there are additional questions.    LOS: 2 days   Mitzi Hansen, MD Internal Medicine Resident PGY-3 [facility] Pager: [phone] 08/15/2020 8:05 AM     Electronically signed: Dr. Kerney Elbe

## 2020-08-15 NOTE — Progress Notes (Signed)
Progress Note    Anthony Downs  [id number] DOB: [birth year]  DOA: 08/12/2020 PCP: Leeanne Rio, MD    Brief Narrative:     Medical records reviewed and are as summarized below:  Anthony Downs is an 70 y.o. male with medical history significant for hypertension, OSA, DVT, meningioma status post left craniectomy.  Patient was brought to the ED via EMS with reports of witnessed seizure by patient's wife.  WOrk up in progress, MRI pending.    Assessment/Plan:   Principal Problem:   Seizures ([place]) Active Problems:   Brain tumor ([place])   Essential hypertension   Status post craniectomy   DVT (deep venous thrombosis) (H[facility])   COVID-19 virus infection   Seizure ([facility])   Seizures, with metabolic encephalopathy- recent hospitalization with metabolic encephalopathy, extensive work-up unrevealing, now presenting with new onset seizures.    - EDP neurosurgeon Dr. Zada Finders, does not anticipate any surgical intervention for his seizures, and to follow-up in clinic, no need for transfer, but Wife requested admission to [facility] -EEG: This study is suggestive of cortical dysfunction arising from left frontocentral region suggestive of underlying structural abnormality. There is also mild diffuse encephalopathy, nonspecific etiology. No seizures or definite epileptiform discharges were seen throughout the recording. - MRI Brain still pending - IV Keppra 1000 mg given, continue 500 twice daily -neurology consulted -Seizure precautions -Resume Seroquel  ? 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 Venetia Constable 04/29/2020. Has chronic right-sided weakness- present since surgery.   Recent COVID infection- patient tested positive for COVID ~ 07/26/2020, > 2 weeks ago.  Per spouse he had a fever only, no other symptoms.  He is vaccinated x2 and had the booster also.  He is on room  air, and chest x-ray is unremarkable. -He is outside the window for COVID-19 treatment, continue routine precautions -COVID test positive today, persistent positive test from 7/9.   History of DVT -Resume Xarelto   Hypertension -Resume propranolol, tamsulosin -will decrease propranolol  obesity Body mass index is 34.64 kg/m.   Family Communication/Anticipated D/C date and plan/Code Status   DVT prophylaxis: xarelto Code Status: Full Code.  Family Communication: wife at bedside Disposition Plan: Status is: inpt  The patient will require care spanning > 2 midnights and should be moved to inpatient because: Inpatient level of care appropriate due to severity of illness  Dispo: The patient is from: Home              Anticipated d/c is to: Home              Patient currently is not medically stable to d/c.   Difficult to place patient No         Medical Consultants:   Neurology     Subjective:   Wife says his "fidgety" movements are improved  Objective:    Vitals:   08/14/20 1957 08/14/20 2351 08/15/20 0425 08/15/20 0758  BP: 121/66 135/82  136/62  Pulse: 70 67 65 70  Resp: '16 16 20 19  '$ Temp: 98.3 F (36.8 C) 98.6 F (37 C) 98.2 F (36.8 C) 98.2 F (36.8 C)  TempSrc: Oral Oral Oral Oral  SpO2: 96% 97% 98% 99%  Weight:      Height:        Intake/Output Summary (Last 24 hours) at 08/15/2020 1206 Last data filed at 08/15/2020 0256 Gross per 24 hour  Intake --  Output 200 ml  Net -200 ml   Filed Weights   08/12/20 1229 08/12/20 2236  Weight: 117.9 kg 119.1 kg    Exam:  General: Appearance:    Obese male in no acute distress     Lungs:     respirations unlabored  Heart:    Normal heart rate.     MS:   All extremities are intact.    Neurologic:   Awake, will answer simple questions with a slight delay      Data Reviewed:   I have personally reviewed following labs and imaging studies:  Labs: Labs show the following:   Basic Metabolic  Panel: Recent Labs  Lab 08/12/20 1303 08/12/20 2308 08/13/20 0309 08/14/20 0250 08/15/20 0348  NA 135  --  137 135 139  K 4.1  --  3.6 3.3* 3.9  CL 103  --  101 100 105  CO2 25  --  '28 28 30  '$ GLUCOSE 123*  --  103* 100* 124*  BUN 13  --  '11 9 8  '$ CREATININE 0.83  --  0.91 0.92 0.94  CALCIUM 8.9  --  9.2 9.1 9.0  MG  --  2.2 2.1  --   --   PHOS  --  3.1 3.7  --   --    GFR Estimated Creatinine Clearance: 97.5 mL/min (by C-G formula based on SCr of 0.94 mg/dL). Liver Function Tests: Recent Labs  Lab 08/12/20 1303  AST 19  ALT 29  ALKPHOS 75  BILITOT 0.7  PROT 6.6  ALBUMIN 3.7   No results for input(s): LIPASE, AMYLASE in the last 168 hours. No results for input(s): AMMONIA in the last 168 hours. Coagulation profile Recent Labs  Lab 08/12/20 1303  INR 1.4*    CBC: Recent Labs  Lab 08/12/20 1303 08/13/20 0309  WBC 7.3 6.9  NEUTROABS 4.6  --   HGB 13.7 12.7*  HCT 42.1 39.4  MCV 91.1 90.2  PLT 248 239   Cardiac Enzymes: No results for input(s): CKTOTAL, CKMB, CKMBINDEX, TROPONINI in the last 168 hours. BNP (last 3 results) No results for input(s): PROBNP in the last 8760 hours. CBG: Recent Labs  Lab 08/12/20 1305  GLUCAP 115*   D-Dimer: No results for input(s): DDIMER in the last 72 hours. Hgb A1c: No results for input(s): HGBA1C in the last 72 hours. Lipid Profile: No results for input(s): CHOL, HDL, LDLCALC, TRIG, CHOLHDL, LDLDIRECT in the last 72 hours. Thyroid function studies: No results for input(s): TSH, T4TOTAL, T3FREE, THYROIDAB in the last 72 hours.  Invalid input(s): FREET3 Anemia work up: Recent Labs    08/14/20 0911  VITAMINB12 556   Sepsis Labs: Recent Labs  Lab 08/12/20 1303 08/13/20 0309  WBC 7.3 6.9    Microbiology Recent Results (from the past 240 hour(s))  Resp Panel by RT-PCR (Flu A&B, Covid) Nasopharyngeal Swab     Status: Abnormal   Collection Time: 08/12/20  5:10 PM   Specimen: Nasopharyngeal Swab;  Nasopharyngeal(NP) swabs in vial transport medium  Result Value Ref Range Status   SARS Coronavirus 2 by RT PCR POSITIVE (A) NEGATIVE Final    Comment: CRITICAL RESULT CALLED TO, READ BACK BY AND VERIFIED WITH: [id number] 08/12/2020 COLEMAN,R (NOTE) SARS-CoV-2 target nucleic acids are DETECTED.  The SARS-CoV-2 RNA is generally detectable in upper respiratory specimens during the acute phase of infection. Positive results are indicative of the presence of the identified virus, but do not rule out  bacterial infection or co-infection with other pathogens not detected by the test. Clinical correlation with patient history and other diagnostic information is necessary to determine patient infection status. The expected result is Negative.  Fact Sheet for Patients: EntrepreneurPulse.com.au  Fact Sheet for Healthcare Providers: IncredibleEmployment.be  This test is not yet approved or cleared by the [place] FDA and  has been authorized for detection and/or diagnosis of SARS-CoV-2 by FDA under an Emergency Use Authorization (EUA).  This EUA will remain in effect (meaning this t est can be used) for the duration of  the COVID-19 declaration under Section 564(b)(1) of the Act, 21 U.S.C. section 360bbb-3(b)(1), unless the authorization is terminated or revoked sooner.     Influenza A by PCR NEGATIVE NEGATIVE Final   Influenza B by PCR NEGATIVE NEGATIVE Final    Comment: (NOTE) The Xpert Xpress SARS-CoV-2/FLU/RSV plus assay is intended as an aid in the diagnosis of influenza from Nasopharyngeal swab specimens and should not be used as a sole basis for treatment. Nasal washings and aspirates are unacceptable for Xpert Xpress SARS-CoV-2/FLU/RSV testing.  Fact Sheet for Patients: EntrepreneurPulse.com.au  Fact Sheet for Healthcare Providers: IncredibleEmployment.be  This test is not yet approved or cleared  by the [place] FDA and has been authorized for detection and/or diagnosis of SARS-CoV-2 by FDA under an Emergency Use Authorization (EUA). This EUA will remain in effect (meaning this test can be used) for the duration of the COVID-19 declaration under Section 564(b)(1) of the Act, 21 U.S.C. section 360bbb-3(b)(1), unless the authorization is terminated or revoked.  Performed at [facility], [address], [place], [place] [address]   Urine Culture     Status: Abnormal (Preliminary result)   Collection Time: 08/13/20  1:31 PM   Specimen: Urine, Clean Catch  Result Value Ref Range Status   Specimen Description URINE, CLEAN CATCH  Final   Special Requests NONE  Final   Culture (A)  Final    >=100,000 COLONIES/mL ESCHERICHIA COLI SUSCEPTIBILITIES TO FOLLOW Performed at [facility] [address], [place], [place] [address]    Report Status PENDING  Incomplete    Procedures and diagnostic studies:  MR BRAIN W WO CONTRAST  Result Date: 08/14/2020 CLINICAL DATA:  Brain mass or lesion. History of meningioma resection. EXAM: MRI HEAD WITHOUT AND WITH CONTRAST TECHNIQUE: Multiplanar, multiecho pulse sequences of the brain and surrounding structures were obtained without and with intravenous contrast. CONTRAST:  54m GADAVIST GADOBUTROL 1 MMOL/ML IV SOLN COMPARISON:  Head CT 2 days ago.  MRI 07/07/2020. FINDINGS: Brain: Diffusion imaging does not show any acute or subacute infarction or other cause of restricted diffusion. No focal abnormality affects the brainstem or cerebellum. There is an old left frontal cortical and subcortical infarction with atrophy and encephalomalacia. There are mild chronic small-vessel ischemic changes of the white matter. There is atrophy, encephalomalacia and gliosis along the medial aspects of both sides of the brain at the frontoparietal vertex, sequela of the previous tumor and subsequent resection. It appears that the midportion of the superior  sagittal sinus has been resected. No evidence of tumor in the venous sinuses anterior or posterior to that. Vascular: Major vessels at the base of the brain show flow. Skull and upper cervical spine: Craniectomy and cranioplasty at the vertex. Otherwise negative. Sinuses/Orbits: Clear/normal Other: None IMPRESSION: No evidence of residual or recurrent meningioma tumor. Vertex craniectomy and cranioplasty for resection of meningioma. Segmental resection of the superior sagittal sinus. No residual or recurrent tumor  seen in the region. Electronically Signed   By: [name] M.D.   On: 08/14/2020 14:33   EEG adult  Result Date: 08/13/2020 [name], MD     08/13/2020  6:08 PM Patient Name: Anthony Downs MRN: [id number] Epilepsy Attending: [name] Referring Physician/Provider: Dr Jenetta Downer Date: 08/13/2020 Duration: 23.52 mins Patient history: 70 year old who has a history of peripheral hemangioma status postresection among other comorbidities, brought in for evaluation of witnessed seizure activity by the wife as well as continuing personality changes and altered mental status ever since surgery. EEG to evaluate for seizure Level of alertness: Awake, asleep AEDs during EEG study: LEV Technical aspects: This EEG study was done with scalp electrodes positioned according to the 10-20 International system of electrode placement. Electrical activity was acquired at a sampling rate of '500Hz'$  and reviewed with a high frequency filter of '70Hz'$  and a low frequency filter of '1Hz'$ . EEG data were recorded continuously and digitally stored. Description: The posterior dominant rhythm consists of 8-'9Hz'$  activity of moderate voltage (25-35 uV) seen predominantly in posterior head regions, symmetric and reactive to eye opening and eye closing. Sleep was characterized by vertex waves, sleep spindles (12 to 14 Hz), maximal frontocentral region. EEG showed continuous sharply contoured 3 to 6 Hz theta-delta  slowing in left frontocentral region. Intermittent generalized 3-'6hz'$  theta-delta slowing was also noted. Hyperventilation and photic stimulation were not performed.   ABNORMALITY - Continuous slow, left frontocentral region - Intermittent slow, generalized IMPRESSION: This study is suggestive of cortical dysfunction arising from left frontocentral region suggestive of underlying structural abnormality. There is also mild diffuse encephalopathy, nonspecific etiology. No seizures or definite epileptiform discharges were seen throughout the recording. Priyanka Barbra Sarks    Medications:    cephALEXin  500 mg Oral Q12H   levETIRAcetam  500 mg Oral BID   pantoprazole  40 mg Oral Daily   propranolol  10 mg Oral TID   QUEtiapine  50 mg Oral QHS   rivaroxaban  20 mg Oral Q supper   tamsulosin  0.8 mg Oral QPC supper   thiamine  100 mg Oral Daily   traZODone  100 mg Oral QHS   vitamin B-12  500 mcg Oral Daily   Continuous Infusions:   LOS: 2 days   Geradine Girt  [facility]   How to contact the [facility] Attending or Consulting provider [place] or covering provider during after hours [place], for this patient?  Check the care team in [facility] and look for a) attending/consulting [facility] provider listed and b) the [facility] team listed Log into [email or web address] and use [place]'s universal password to access. If you do not have the password, please contact the hospital operator. Locate the [facility] provider you are looking for under [facility] and page to a number that you can be directly reached. If you still have difficulty reaching the provider, please page the [facility] (Director on Call) for the Hospitalists listed on amion for assistance.  08/15/2020, 12:06 PM

## 2020-08-16 LAB — URINE CULTURE: Culture: >=100000 — AB

## 2020-08-16 MED ORDER — LEVETIRACETAM 500 MG PO TABS: 500.0000 mg | ORAL_TABLET | Freq: Two times a day (BID) | ORAL | 1 refills | Status: DC

## 2020-08-16 MED ORDER — LEVETIRACETAM 500 MG PO TABS: 500.0000 mg | ORAL_TABLET | Freq: Two times a day (BID) | ORAL | 0 refills | Status: DC

## 2020-08-16 MED ORDER — CEPHALEXIN 500 MG PO CAPS: 500.0000 mg | ORAL_CAPSULE | Freq: Two times a day (BID) | ORAL | 0 refills | Status: DC

## 2020-08-16 MED ORDER — CEFDINIR 300 MG PO CAPS: 300.0000 mg | ORAL_CAPSULE | Freq: Two times a day (BID) | ORAL | 0 refills | Status: DC

## 2020-08-16 MED ORDER — TRAZODONE HCL 50 MG PO TABS: 100.0000 mg | ORAL_TABLET | Freq: Every day | ORAL | Status: DC

## 2020-08-16 NOTE — TOC Transition Note (Signed)
Transition of Care [facility]) - CM/SW Discharge Note   Patient Details  Name: Anthony Downs MRN: [id number] Date of Birth: 09/01/1950  Transition of Care [facility]) CM/SW Contact:  Konrad Penta, RN Phone Number: [phone] 08/16/2020, 9:27 AM   Clinical Narrative:   Patient slated for transition to home today with home health services with [facility]. Confirmed with Georgina Snell with [name]. Spoke with patient's wife and verified address for PTAR transportation to home. Spoke with patient's nurse as well.   PTAR contacted at 0917am. Paperwork taken to unit.   No further needs assessed.    Final next level of care: [place] Barriers to Discharge: No Barriers Identified   Patient Goals and CMS Choice Patient states their goals for this hospitalization and ongoing recovery are:: return home CMS Medicare.gov Compare Post Acute Care list provided to:: Patient Represenative (must comment) Choice offered to / list presented to : Spouse  Discharge Placement                       Discharge Plan and Services   Discharge Planning Services: CM Consult                                 Social Determinants of Health (SDOH) Interventions     Readmission Risk Interventions No flowsheet data found.

## 2020-08-16 NOTE — Discharge Summary (Signed)
Physician Discharge Summary  Anthony Downs [id number] DOB: December 09, 1950 DOA: 08/12/2020  PCP: Anthony Rio, MD  Admit date: 08/12/2020 Discharge date: 08/16/2020  Admitted From: home Discharge disposition: home   Recommendations for Outpatient Follow-Up:   Abx changed to omnicef based on cultures-- wife called and updated Neurology referral placed Home health    Discharge Diagnosis:   Principal Problem:   Seizures ([place]) Active Problems:   Brain tumor [facility])   Essential hypertension   Status post craniectomy   DVT (deep venous thrombosis) ([place])   COVID-19 virus infection   Seizure ([place])    Discharge Condition: Improved.  Diet recommendation: regular  Wound care: None.  Code status: Full.   History of Present Illness:   Anthony Downs is a 70 y.o. male with medical history significant for hypertension, OSA, DVT, meningioma status post left craniectomy.  Patient was brought to the ED via EMS with reports of witnessed seizure by patient's wife. Patient's wife is at bedside and provides the history.  Per my permission patient is awake, able to answer a few questions appropriately, but otherwise appears confused and is very fidgety. Patient's spouse reports that today she was in the kitchen when she had an unusual noise/shaking so she went to check and saw that her husband was having a seizure, his legs were jerking, his eyes were rolled up.  She is unsure how long the seizure lasted but probably 2 minutes.  Seizures stopped before EMS arrived.  No history of seizures.  After the seizure he was confused, speech appeared garbled, but this has improved in the ED, he is still confused, and his speech is incomprehensible.   Spouse reports since brain surgery in April, patient's mental status has changed.  He is still able to talk, but he is very fidgety, and episodes of sundowning at night.,  He has also not been able to ambulate due to right-sided  weakness.  Over the past several days he has not been sleeping.  Patient was prescribed hydroxyzine by outpatient provider, this helped calm patient a bit.   Hospital Course by Problem:   Seizures, with metabolic encephalopathy- recent hospitalization with metabolic encephalopathy, extensive work-up unrevealing, now presenting with new onset seizures.    - EDP neurosurgeon Dr. Zada Finders, does not anticipate any surgical intervention for his seizures, and to follow-up in clinic, no need for transfer, but Wife requested [facility] to [facility] -EEG: This study is suggestive of cortical dysfunction arising from left frontocentral region suggestive of underlying structural abnormality. There is also mild diffuse encephalopathy, nonspecific etiology. No seizures or definite epileptiform discharges were seen throughout the recording. - MRI Brain still pending - IV Keppra 1000 mg given, continue 500 twice daily -neurology outpatient follow up -Seizure precautions -Resume Seroquel   Ecoli UTI -abx started   Left posterior frontal lobe meningioma now status post biparietal craniectomy-by Dr. Venetia Constable 04/29/2020. Has chronic right-sided weakness- present since surgery.   Recent COVID infection- patient tested positive for COVID ~ 07/26/2020, > 2 weeks ago.  Per spouse he had a fever only, no other symptoms.  He is vaccinated x2 and had the booster also.  He is on room air, and chest x-ray is unremarkable. -He is outside the window for COVID-19 treatment, continue routine precautions -COVID test positive in hospital, persistent positive test from 7/9.   History of DVT -Resume Xarelto   Hypertension -Resume propranolol, tamsulosin   obesity Body mass index is 34.64 kg/m.  Medical Consultants:   neurology    Discharge Exam:   Vitals:   08/16/20 0359 08/16/20 0804  BP: (!) 146/77 (!) 154/75  Pulse: 78 80  Resp: 20 16  Temp: 97.7 F (36.5 C) 97.8 F (36.6 C)  SpO2: 98% 100%    Vitals:   08/15/20 2018 08/16/20 0001 08/16/20 0359 08/16/20 0804  BP: (!) 113/93 (!) 105/59 (!) 146/77 (!) 154/75  Pulse: 75 63 78 80  Resp: '18 18 20 16  '$ Temp: 98.3 F (36.8 C) 97.7 F (36.5 C) 97.7 F (36.5 C) 97.8 F (36.6 C)  TempSrc: Oral Oral Oral Oral  SpO2: 99% 97% 98% 100%  Weight:      Height:        General exam: Appears calm and comfortable.    The results of significant diagnostics from this hospitalization (including imaging, microbiology, ancillary and laboratory) are listed below for reference.     Procedures and Diagnostic Studies:   DG Chest 1 View  Result Date: 08/12/2020 CLINICAL DATA:  Status post seizure today. EXAM: CHEST  1 VIEW COMPARISON:  None. FINDINGS: Lungs clear. Heart size normal. No pneumothorax or pleural fluid. No acute or focal bony abnormality. IMPRESSION: Negative chest. Electronically Signed   By: Inge Rise M.D.   On: 08/12/2020 14:16   CT HEAD WO CONTRAST  Result Date: 08/12/2020 CLINICAL DATA:  TIA, history of meningioma resection EXAM: CT HEAD WITHOUT CONTRAST TECHNIQUE: Contiguous axial images were obtained from the base of the skull through the vertex without intravenous contrast. COMPARISON:  CT head 07/06/2020 FINDINGS: Brain: Postoperative changes are again identified at the vertex with encephalomalacia of the left greater than right parasagittal frontal lobes. Additional areas of encephalomalacia identified in the anterior left frontal lobe and bilateral anterior temporal lobes. Other patchy low-attenuation in the supratentorial white matter probably reflects similar chronic microvascular ischemic changes. Ventricles and sulci are stable in size and configuration. There is no acute intracranial hemorrhage. No new loss of gray-white differentiation. Vascular: No hyperdense vessel or unexpected calcification. Skull: Calvarium is unremarkable. Sinuses/Orbits: No acute finding. Other: None. IMPRESSION: No acute intracranial  abnormality. Similar appearance of postoperative changes. Electronically Signed   By: Macy Mis M.D.   On: 08/12/2020 14:54   EEG adult  Result Date: 08/13/2020 [name], MD     08/13/2020  6:08 PM Patient Name: Anthony Downs MRN: [id number] Epilepsy Attending: [name] Referring Physician/Provider: Dr Jenetta Downer Date: 08/13/2020 Duration: 23.52 mins Patient history: 70 year old who has a history of peripheral hemangioma status postresection among other comorbidities, brought in for evaluation of witnessed seizure activity by the wife as well as continuing personality changes and altered mental status ever since surgery. EEG to evaluate for seizure Level of alertness: Awake, asleep AEDs during EEG study: LEV Technical aspects: This EEG study was done with scalp electrodes positioned according to the 10-20 International system of electrode placement. Electrical activity was acquired at a sampling rate of '500Hz'$  and reviewed with a high frequency filter of '70Hz'$  and a low frequency filter of '1Hz'$ . EEG data were recorded continuously and digitally stored. Description: The posterior dominant rhythm consists of 8-'9Hz'$  activity of moderate voltage (25-35 uV) seen predominantly in posterior head regions, symmetric and reactive to eye opening and eye closing. Sleep was characterized by vertex waves, sleep spindles (12 to 14 Hz), maximal frontocentral region. EEG showed continuous sharply contoured 3 to 6 Hz theta-delta slowing in left frontocentral region. Intermittent generalized 3-'6hz'$  theta-delta slowing was also  noted. Hyperventilation and photic stimulation were not performed.   ABNORMALITY - Continuous slow, left frontocentral region - Intermittent slow, generalized IMPRESSION: This study is suggestive of cortical dysfunction arising from left frontocentral region suggestive of underlying structural abnormality. There is also mild diffuse encephalopathy, nonspecific etiology. No  seizures or definite epileptiform discharges were seen throughout the recording. [place]     Labs:   Basic Metabolic Panel: Recent Labs  Lab 08/12/20 1303 08/12/20 2308 08/13/20 0309 08/14/20 0250 08/15/20 0348  NA 135  --  137 135 139  K 4.1  --  3.6 3.3* 3.9  CL 103  --  101 100 105  CO2 25  --  '28 28 30  '$ GLUCOSE 123*  --  103* 100* 124*  BUN 13  --  '11 9 8  '$ CREATININE 0.83  --  0.91 0.92 0.94  CALCIUM 8.9  --  9.2 9.1 9.0  MG  --  2.2 2.1  --   --   PHOS  --  3.1 3.7  --   --    GFR Estimated Creatinine Clearance: 97.5 mL/min (by C-G formula based on SCr of 0.94 mg/dL). Liver Function Tests: Recent Labs  Lab 08/12/20 1303  AST 19  ALT 29  ALKPHOS 75  BILITOT 0.7  PROT 6.6  ALBUMIN 3.7   No results for input(s): LIPASE, AMYLASE in the last 168 hours. No results for input(s): AMMONIA in the last 168 hours. Coagulation profile Recent Labs  Lab 08/12/20 1303  INR 1.4*    CBC: Recent Labs  Lab 08/12/20 1303 08/13/20 0309  WBC 7.3 6.9  NEUTROABS 4.6  --   HGB 13.7 12.7*  HCT 42.1 39.4  MCV 91.1 90.2  PLT 248 239   Cardiac Enzymes: No results for input(s): CKTOTAL, CKMB, CKMBINDEX, TROPONINI in the last 168 hours. BNP: Invalid input(s): POCBNP CBG: Recent Labs  Lab 08/12/20 1305  GLUCAP 115*   D-Dimer No results for input(s): DDIMER in the last 72 hours. Hgb A1c No results for input(s): HGBA1C in the last 72 hours. Lipid Profile No results for input(s): CHOL, HDL, LDLCALC, TRIG, CHOLHDL, LDLDIRECT in the last 72 hours. Thyroid function studies No results for input(s): TSH, T4TOTAL, T3FREE, THYROIDAB in the last 72 hours.  Invalid input(s): FREET3 Anemia work up Recent Labs    08/14/20 0911  [id number]   Microbiology Recent Results (from the past 240 hour(s))  Resp Panel by RT-PCR (Flu A&B, Covid) Nasopharyngeal Swab     Status: Abnormal   Collection Time: 08/12/20  5:10 PM   Specimen: Nasopharyngeal Swab;  Nasopharyngeal(NP) swabs in vial transport medium  Result Value Ref Range Status   SARS Coronavirus 2 by RT PCR POSITIVE (A) NEGATIVE Final    Comment: CRITICAL RESULT CALLED TO, READ BACK BY AND VERIFIED WITH: [id number] 08/12/2020 COLEMAN,R (NOTE) SARS-CoV-2 target nucleic acids are DETECTED.  The SARS-CoV-2 RNA is generally detectable in upper respiratory specimens during the acute phase of infection. Positive results are indicative of the presence of the identified virus, but do not rule out bacterial infection or co-infection with other pathogens not detected by the test. Clinical correlation with patient history and other diagnostic information is necessary to determine patient infection status. The expected result is Negative.  Fact Sheet for Patients: EntrepreneurPulse.com.au  Fact Sheet for Healthcare Providers: IncredibleEmployment.be  This test is not yet approved or cleared by the [place] FDA and  has been authorized for detection and/or diagnosis of SARS-CoV-2 by  FDA under an Emergency Use Authorization (EUA).  This EUA will remain in effect (meaning this t est can be used) for the duration of  the COVID-19 declaration under Section 564(b)(1) of the Act, 21 U.S.C. section 360bbb-3(b)(1), unless the authorization is terminated or revoked sooner.     Influenza A by PCR NEGATIVE NEGATIVE Final   Influenza B by PCR NEGATIVE NEGATIVE Final    Comment: (NOTE) The Xpert Xpress SARS-CoV-2/FLU/RSV plus assay is intended as an aid in the diagnosis of influenza from Nasopharyngeal swab specimens and should not be used as a sole basis for treatment. Nasal washings and aspirates are unacceptable for Xpert Xpress SARS-CoV-2/FLU/RSV testing.  Fact Sheet for Patients: EntrepreneurPulse.com.au  Fact Sheet for Healthcare Providers: IncredibleEmployment.be  This test is not yet approved or cleared  by the [place] FDA and has been authorized for detection and/or diagnosis of SARS-CoV-2 by FDA under an Emergency Use Authorization (EUA). This EUA will remain in effect (meaning this test can be used) for the duration of the COVID-19 declaration under Section 564(b)(1) of the Act, 21 U.S.C. section 360bbb-3(b)(1), unless the authorization is terminated or revoked.  Performed at [facility], [address] [place], [place] [address]   Urine Culture     Status: Abnormal   Collection Time: 08/13/20  1:31 PM   Specimen: Urine, Clean Catch  Result Value Ref Range Status   Specimen Description URINE, CLEAN CATCH  Final   Special Requests   Final    NONE Performed at [facility], [place] [address] [place], [place] [address]    Culture >=100,000 COLONIES/mL ESCHERICHIA COLI (A)  Final   Report Status 08/16/2020 FINAL  Final   Organism ID, Bacteria ESCHERICHIA COLI (A)  Final      Susceptibility   Escherichia coli - MIC*    AMPICILLIN >=32 RESISTANT Resistant     CEFAZOLIN >=64 RESISTANT Resistant     CEFEPIME <=0.12 SENSITIVE Sensitive     CEFTRIAXONE <=0.25 SENSITIVE Sensitive     CIPROFLOXACIN <=0.25 SENSITIVE Sensitive     GENTAMICIN <=1 SENSITIVE Sensitive     IMIPENEM <=0.25 SENSITIVE Sensitive     NITROFURANTOIN <=16 SENSITIVE Sensitive     TRIMETH/SULFA <=20 SENSITIVE Sensitive     AMPICILLIN/SULBACTAM >=32 RESISTANT Resistant     PIP/TAZO >=128 RESISTANT Resistant     * >=100,000 COLONIES/mL ESCHERICHIA COLI     Discharge Instructions:   Discharge Instructions     Ambulatory referral to Neurology   Complete by: As directed    An appointment is requested in approximately: 2 weeks   Diet general   Complete by: As directed    Increase activity slowly   Complete by: As directed       Allergies as of 08/16/2020       Reactions   Ativan [lorazepam]    Worsening confusion   Haldol [haloperidol]    Worsening confusion        Medication List      STOP taking these medications    cefpodoxime 200 MG tablet Commonly known as: VANTIN   hydrocortisone cream 1 %   magic mouthwash w/lidocaine Soln       TAKE these medications    atorvastatin 10 MG tablet Commonly known as: LIPITOR Take 1 tablet (10 mg total) by mouth at bedtime.   cefdinir 300 MG capsule Commonly known as: OMNICEF Take 1 capsule (300 mg total) by mouth 2 (two) times daily.   diclofenac Sodium 1 % Gel  Commonly known as: VOLTAREN Apply 2 g topically 4 (four) times daily.   furosemide 20 MG tablet Commonly known as: LASIX Take 1 tablet (20 mg total) by mouth daily.   hydrOXYzine 25 MG tablet Commonly known as: ATARAX/VISTARIL Take 1 tablet (25 mg total) by mouth 3 (three) times daily as needed for anxiety.   levETIRAcetam 500 MG tablet Commonly known as: KEPPRA Take 1 tablet (500 mg total) by mouth 2 (two) times daily.   pantoprazole 40 MG tablet Commonly known as: PROTONIX Take 1 tablet (40 mg total) by mouth daily.   potassium chloride SA 20 MEQ tablet Commonly known as: KLOR-CON Take 1 tablet (20 mEq total) by mouth daily.   propranolol 20 MG tablet Commonly known as: INDERAL Take 1 tablet (20 mg total) by mouth 3 (three) times daily.   QUEtiapine 50 MG tablet Commonly known as: SEROQUEL Take 1 tablet (50 mg total) by mouth at bedtime.   rivaroxaban 20 MG Tabs tablet Commonly known as: XARELTO Take 1 tablet (20 mg total) by mouth daily with supper.   senna-docusate 8.6-50 MG tablet Commonly known as: Senokot-S Take 2 tablets by mouth 2 (two) times daily. Need to purchase over the counter   SOOTHE OP Apply 1 drop to eye daily as needed (dry eyes).   tamsulosin 0.4 MG Caps capsule Commonly known as: FLOMAX Take 2 capsules (0.8 mg total) by mouth every morning. What changed: when to take this   thiamine 100 MG tablet Take 1 tablet (100 mg total) by mouth daily.   traZODone 50 MG tablet Commonly known as: DESYREL Take 2  tablets (100 mg total) by mouth at bedtime.   vitamin B-12 500 MCG tablet Commonly known as: CYANOCOBALAMIN Take 1 tablet (500 mcg total) by mouth daily.        Follow-up Information     Anthony Rio, MD Follow up in 1 week(s).   Specialty: Family Medicine Contact information: [place] [address] [id number]                  Time coordinating discharge: 35 min  Signed:  Geradine Girt DO  [facility] 08/16/2020, 4:31 PM

## 2020-08-22 ENCOUNTER — Encounter: Payer: Self-pay | Admitting: Orthopedic Surgery

## 2020-08-22 ENCOUNTER — Other Ambulatory Visit: Payer: Self-pay

## 2020-08-22 ENCOUNTER — Ambulatory Visit: Payer: Medicare Other | Admitting: Orthopedic Surgery

## 2020-08-22 ENCOUNTER — Ambulatory Visit (INDEPENDENT_AMBULATORY_CARE_PROVIDER_SITE_OTHER): Payer: Medicare Other | Admitting: Orthopedic Surgery

## 2020-08-22 VITALS — BP 135/82 | HR 76

## 2020-08-22 DIAGNOSIS — M19019 Primary osteoarthritis, unspecified shoulder: Secondary | ICD-10-CM

## 2020-08-22 DIAGNOSIS — M19011 Primary osteoarthritis, right shoulder: Secondary | ICD-10-CM

## 2020-08-23 ENCOUNTER — Encounter: Payer: Self-pay | Admitting: Orthopedic Surgery

## 2020-08-23 NOTE — Progress Notes (Signed)
Orthopaedic Clinic Return  Assessment: Anthony Downs is a 70 y.o. male with the following: Persistent right shoulder pain; mild GH arthritis.  No relief with subacromial injection  Plan: Continued right shoulder pain.  Previously had subacromial injection but this did not help.  Since he was seen he has had a number of health issues.  He has limited use of his right arm, so I think that is contributing to his swelling.  It is unclear what is causing the right hand pain.  We discussed a repeat shoulder injection, and he would like to have this done a [employer].  We will place a referral for a image guided glenohumeral injection.  Continue with PT. Follow up as needed.  Follow-up: Return if symptoms worsen or fail to improve.   Subjective:  Chief Complaint  Patient presents with   Shoulder Pain    Right shoulder   Hand Pain    Right hand/ the brain tumor on mostly on the left side of the brain which controls the right side/is using shoulder even less now than when he was in the hospital    History of Present Illness: Anthony Downs is a 70 y.o. male who returns to clinic for repeat evaluation of his right shoulder.  He was last seen late in 2021 and given a subacromial injection. This did not improve his pain.  In April, he started to have some issues with motor control and was found to have a brain tumor.  This was subsequently removed and he has some right sided deficits.  More recently, he sustained a seizure and continues to recover.  He still has pain in his right shoulder.  Since surgery, he has swelling of the right upper extremity. He is also having pain in his right hand, no specific onset.  PT is working with since surgery.  He has pain with passive motion above his head.  It is also painful when they move him.    Review of Systems: No fevers or chills No numbness or tingling No chest pain No shortness of breath No bowel or bladder dysfunction No GI distress No  headaches  Objective: BP 135/82   Pulse 76   Physical Exam:  Seated in a wheelchair.  Right arm is swollen.  Pain with passive forward flexion to 90 degrees. Pain with passive abduction to 90. Limited active motion of the right arm and hand.  No trauma to right hand.   IMAGING: I personally ordered and reviewed the following images:  No new imaging obtained today.   Mordecai Rasmussen, MD 08/23/2020 9:35 PM

## 2020-08-28 ENCOUNTER — Other Ambulatory Visit: Payer: Self-pay

## 2020-09-01 ENCOUNTER — Other Ambulatory Visit: Payer: Self-pay

## 2020-09-01 ENCOUNTER — Ambulatory Visit (INDEPENDENT_AMBULATORY_CARE_PROVIDER_SITE_OTHER): Payer: Medicare Other | Admitting: Neurology

## 2020-09-01 ENCOUNTER — Encounter: Payer: Self-pay | Admitting: Neurology

## 2020-09-01 VITALS — BP 124/85 | HR 69

## 2020-09-01 DIAGNOSIS — Z86018 Personal history of other benign neoplasm: Secondary | ICD-10-CM

## 2020-09-01 DIAGNOSIS — R410 Disorientation, unspecified: Secondary | ICD-10-CM

## 2020-09-01 DIAGNOSIS — Z9889 Other specified postprocedural states: Secondary | ICD-10-CM | POA: Diagnosis not present

## 2020-09-01 DIAGNOSIS — G250 Essential tremor: Secondary | ICD-10-CM | POA: Diagnosis not present

## 2020-09-01 DIAGNOSIS — G40909 Epilepsy, unspecified, not intractable, without status epilepticus: Secondary | ICD-10-CM | POA: Diagnosis not present

## 2020-09-01 MED ORDER — DIVALPROEX SODIUM 500 MG PO DR TAB: 1000.0000 mg | DELAYED_RELEASE_TABLET | Freq: Two times a day (BID) | ORAL | 6 refills | Status: DC

## 2020-09-01 MED ORDER — QUETIAPINE FUMARATE 50 MG PO TABS: 100.0000 mg | ORAL_TABLET | Freq: Every day | ORAL | 6 refills | Status: DC
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
## 2020-09-01 NOTE — Patient Instructions (Addendum)
You have been prescribed Depakote 1000 mg twice a day   Day 1: Start Depakote 1000 mg twice a day and Keppra 500 mg twice a day   Day 2: Continue with Depakote 1000 mg twice a day and decrease Keppra to 500 mg nightly   Day 3: Discontinue Keppra and continue with Depakote 1000 mg twice a day   Increase Seroquel to 100 mg nightly  Return to clinic in 3 months

## 2020-09-03 ENCOUNTER — Ambulatory Visit
Admission: RE | Admit: 2020-09-03 | Discharge: 2020-09-03 | Disposition: A | Discharge status: Home / Self Care | Payer: Medicare Other | Source: Ambulatory Visit | Attending: Family Medicine | Admitting: Family Medicine

## 2020-09-03 ENCOUNTER — Ambulatory Visit (HOSPITAL_COMMUNITY)
Admission: RE | Admit: 2020-09-03 | Discharge: 2020-09-03 | Disposition: A | Discharge status: Home / Self Care | Payer: Medicare Other | Source: Ambulatory Visit | Attending: Orthopedic Surgery | Admitting: Orthopedic Surgery

## 2020-09-03 ENCOUNTER — Encounter (HOSPITAL_COMMUNITY): Payer: Self-pay

## 2020-09-03 ENCOUNTER — Other Ambulatory Visit: Payer: Self-pay

## 2020-09-03 VITALS — BP 103/68 | HR 68 | Temp 98.2°F | Resp 20

## 2020-09-03 DIAGNOSIS — M19011 Primary osteoarthritis, right shoulder: Secondary | ICD-10-CM | POA: Diagnosis not present

## 2020-09-03 DIAGNOSIS — G8929 Other chronic pain: Secondary | ICD-10-CM | POA: Diagnosis present

## 2020-09-03 DIAGNOSIS — R8290 Unspecified abnormal findings in urine: Secondary | ICD-10-CM | POA: Diagnosis not present

## 2020-09-03 DIAGNOSIS — M25511 Pain in right shoulder: Secondary | ICD-10-CM | POA: Diagnosis not present

## 2020-09-03 DIAGNOSIS — M19019 Primary osteoarthritis, unspecified shoulder: Secondary | ICD-10-CM

## 2020-09-03 LAB — POCT URINALYSIS DIP (MANUAL ENTRY)
Bilirubin, UA: NEGATIVE
Blood, UA: NEGATIVE
Glucose, UA: NEGATIVE mg/dL
Nitrite, UA: NEGATIVE
Protein Ur, POC: NEGATIVE mg/dL
Spec Grav, UA: 1.025 (ref 1.010–1.025)
Urobilinogen, UA: 0.2 E.U./dL
pH, UA: 6 (ref 5.0–8.0)

## 2020-09-03 MED ORDER — LIDOCAINE HCL (PF) 1 % IJ SOLN
INTRAMUSCULAR | Status: AC
  Administered 2020-09-03: 4 mL
  Filled 2020-09-03: qty 5

## 2020-09-03 MED ORDER — METHYLPREDNISOLONE ACETATE 40 MG/ML IJ SUSP
INTRAMUSCULAR | Status: AC
  Administered 2020-09-03: 40 mg
  Filled 2020-09-03: qty 1

## 2020-09-03 MED ORDER — CEPHALEXIN 500 MG PO CAPS: 500.0000 mg | ORAL_CAPSULE | Freq: Two times a day (BID) | ORAL | 0 refills | Status: DC

## 2020-09-03 MED ORDER — BUPIVACAINE HCL (PF) 0.5 % IJ SOLN
INTRAMUSCULAR | Status: AC
  Administered 2020-09-03: 3 mL
  Filled 2020-09-03: qty 30

## 2020-09-03 MED ORDER — POVIDONE-IODINE 10 % EX SOLN
CUTANEOUS | Status: AC
  Filled 2020-09-03: qty 15

## 2020-09-03 MED ORDER — IOPAMIDOL (ISOVUE-300) INJECTION 61%
30.0000 mL | Freq: Once | INTRAVENOUS | Status: AC | PRN
  Administered 2020-09-03: 3 mL
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
## 2020-09-03 NOTE — Procedures (Signed)
Preprocedure Dx: Chronic RIGHT shoulder pain Postprocedure Dx: Chronic RIGHT shoulder pain Procedure  Fluoroscopically guided RT shoulder joint injection therapeutic Radiologist:  Thornton Papas Anesthesia:  4 ml of 1% lidocaine Injectate:  40 mg DepoMedrol, 3 ml of 0.5% bupivicaine Fluoro time:  1 minutes 30 seconds EBL:   None Complications: None

## 2020-09-03 NOTE — Discharge Instructions (Addendum)
You have had labs (urine culture) sent today. We will call you with any significant abnormalities or if there is need to begin or change treatment or pursue further follow up.  You may also review your test results online through MyChart. If you do not have a MyChart account, instructions to sign up should be on your discharge paperwork.  

## 2020-09-03 NOTE — ED Triage Notes (Signed)
Pt here with foul smelling urine and being extra "fidgety" lately according to wife. 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

## 2020-09-11 MED ORDER — TRAZODONE HCL 50 MG PO TABS: 100.0000 mg | ORAL_TABLET | Freq: Every day | ORAL | 0 refills | Status: DC

## 2020-09-17 ENCOUNTER — Other Ambulatory Visit: Payer: Self-pay

## 2020-09-17 ENCOUNTER — Encounter: Payer: Medicare Other | Attending: Physical Medicine & Rehabilitation | Admitting: Physical Medicine & Rehabilitation

## 2020-09-17 ENCOUNTER — Encounter: Payer: Self-pay | Admitting: Physical Medicine & Rehabilitation

## 2020-09-17 VITALS — BP 100/60 | HR 74 | Temp 98.6°F | Ht 73.0 in

## 2020-09-17 DIAGNOSIS — R569 Unspecified convulsions: Secondary | ICD-10-CM | POA: Insufficient documentation

## 2020-09-17 DIAGNOSIS — D329 Benign neoplasm of meninges, unspecified: Secondary | ICD-10-CM | POA: Insufficient documentation

## 2020-09-17 DIAGNOSIS — Z5181 Encounter for therapeutic drug level monitoring: Secondary | ICD-10-CM | POA: Diagnosis present

## 2020-09-17 DIAGNOSIS — G8250 Quadriplegia, unspecified: Secondary | ICD-10-CM | POA: Insufficient documentation

## 2020-09-17 DIAGNOSIS — N390 Urinary tract infection, site not specified: Secondary | ICD-10-CM | POA: Insufficient documentation

## 2020-09-17 DIAGNOSIS — A499 Bacterial infection, unspecified: Secondary | ICD-10-CM | POA: Insufficient documentation

## 2020-09-17 NOTE — Progress Notes (Signed)
Subjective:    Patient ID: Anthony Downs, male    DOB: 08/27/1950, 70 y.o.   MRN: [id number]  HPI Anthony Downs is here in follow up of his meningioma and tetraplegia. His summer has been complicated by frequent UTI's and seizures.  He has been in the hospital on 3 different occasions.  The most recent one was due to seizure activity and UTI.  He and his wife have been working with neurology on seizure prophylaxis and he was changed to Depakote over the summer.  Wife has noticed that since being on the Depakote he has been much slower and more lethargic in general.  Therapy currently as noted as well.  Patient has completed home health therapies.  Patient remains wheelchair dependent and is using a power wheelchair currently.  He is able to operate the chair for short short distances on his own with some cues.  Wife is struggling more with his care due to his more recent lethargy.  She has a right ankle injury that has arisen due to have a his physical care needs.  He is sleeping fairly well now.  He is on trazodone 100 mg at night with Seroquel 50 mg.  Neurology briefly increased to 100 mg but this made him quite lethargic in the morning.  He is currently taking Depakote 500 mg in the morning and 1000 mg at night.  No lab levels have been checked.  He has been on numerous antibiotics over the summer.  A referral has been made to urology which will take place in October.  He is frequently incontinent and has difficult time indicating to his wife when he needs to empty.  Pain Inventory Average Pain 5 Pain Right Now 5 My pain is intermittent  LOCATION OF PAIN RIGHT HAND, RIGHT KNEE  BOWEL Number of stools per week: 5 Oral laxative use Yes  Type of laxative ORAL Enema or suppository use No  History of colostomy No  Incontinent No   BLADDER Pads In and out cath, frequency N/A Able to self cath No  Bladder incontinence Yes  Frequent urination No  Leakage with coughing No   Difficulty starting stream No  Incomplete bladder emptying No    Mobility walk with assistance ability to climb steps?  no do you drive?  no use a wheelchair needs help with transfers  Function retired  Neuro/Psych bladder control problems weakness tremor trouble walking confusion depression anxiety  Prior Studies Any changes since last visit?  yes CT/MRI nerve study  Physicians involved in your care Any changes since last visit?  yes, Dr. Marquette Saa   Family History  Problem Relation Age of Onset   Breast cancer Mother    Pancreatic cancer Mother    Aortic aneurysm Father    Social History   Socioeconomic History   Marital status: Married    Spouse name: Not on file   Number of children: 2   Years of education: 12   Highest education level: High school graduate  Occupational History   Occupation: Retired  Tobacco Use   Smoking status: Never   Smokeless tobacco: Never  Vaping Use   Vaping Use: Never used  Substance and Sexual Activity   Alcohol use: No    Comment: no use since 2008   Drug use: No   Sexual activity: Yes  Other Topics Concern   Not on file  Social History Narrative   Lives with wife.   Right-handed.   Two cans Dr. Malachi Bonds  daily.   Social Determinants of Health   Financial Resource Strain: Not on file  Food Insecurity: Not on file  Transportation Needs: Not on file  Physical Activity: Not on file  Stress: Not on file  Social Connections: Not on file   Past Surgical History:  Procedure Laterality Date   APPLICATION OF CRANIAL NAVIGATION Left 04/29/2020   Procedure: [place];  Surgeon: Judith Part, MD;  Location: [facility];  Service: Neurosurgery;  Laterality: Left;   arthroscopic knee     COLONOSCOPY N/A 11/29/2018   Procedure: COLONOSCOPY;  Surgeon: Rogene Houston, MD;  [facility];  Service: Endoscopy;  Laterality: N/A;  730-rescheduled 9/2 same time per Ann   CRANIOTOMY Left  04/29/2020   Procedure: LEFT CRANIECTOMY WITH TUMOR EXCISION;  Surgeon: Judith Part, MD;  Location: [facility];  Service: Neurosurgery;  Laterality: Left;   KNEE SURGERY Right    NASAL SEPTOPLASTY W/ TURBINOPLASTY Bilateral 03/19/2020   Procedure: NASAL SEPTOPLASTY WITH BILATERAL  TURBINATE REDUCTION;  Surgeon: Leta Baptist, MD;  Location: [facility];  Service: ENT;  Laterality: Bilateral;   VASECTOMY     Past Medical History:  Diagnosis Date   Acid reflux    Anxiety    Arthritis    Asthma    as a child   Benign brain tumor ([place])    Cancer ([place])    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  Review of Systems  Genitourinary:        Incontinence  Musculoskeletal:  Positive for gait problem.  Neurological:  Positive for tremors and weakness.  Psychiatric/Behavioral:  Positive for confusion.        Depression , anxiety  All other systems reviewed and are negative.     Objective:   Physical Exam  Gen: no distress, normal appearing. obese HEENT: oral mucosa pink and moist, NCAT Cardio: Reg rate Chest: normal effort, normal rate of breathing Abd: soft, non-distended Ext: no edema Psych: pleasant, normal affect Skin: intact Neuro: alert, , follows simple commands with delay.  Will speak spontaneously on his own but does not speak readily when spoken to.  Oriented to person. Intentional tremor. Resting tone 2/4 RUE and 2-3/5 with tight heel cord.  Upper  extremity strength grossly 3+ to 4 out of 5.  Lower extremities are 1-2 out of 5 proximal to trace distally.  He really does not initiate much movement in either legs.  Does seem to sense pain.  Reflexes are 3+. Musculoskeletal: normal passive rom. No pain in seated position in back      Assessment & Plan:  1. 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  Left posterior tibial vein DVT: Patient remains on Xarelto 3.  Sleep disorder/evening hallucinations/sundowning             -continue seroquel at '50mg'$  qhs  -continue trazodone '100mg'$  qhs 4.  Hypertension--per primary 5.  Urinary frequency with incontinence: frequent UTI's  -agree with urology consult  -aggressive rx of UTI 6.Seizures: depakote 500 am and '1000mg'$  pm  -check level today, may need reduction or different medication  -manage UTI's  Thirty minutes of face to face patient care time were spent during this visit. All questions were encouraged and answered. Follow up with me in 2 mos.

## 2020-09-17 NOTE — Patient Instructions (Addendum)
PLEASE FEEL FREE TO CALL OUR OFFICE WITH ANY PROBLEMS OR QUESTIONS [id number])  Try some heat to arm and leg before stretching. Try to stretch limbs 2-3 x per day to keep his joints loose.

## 2020-09-18 LAB — HEPATIC FUNCTION PANEL
ALT: 12 IU/L (ref 0–44)
AST: 9 IU/L (ref 0–40)
Albumin: 3.9 g/dL (ref 3.7–4.7)
Alkaline Phosphatase: 71 IU/L (ref 44–121)
Bilirubin Total: 0.5 mg/dL (ref 0.0–1.2)
Bilirubin, Direct: 0.13 mg/dL (ref 0.00–0.40)
Total Protein: 6.4 g/dL (ref 6.0–8.5)

## 2020-09-18 LAB — VALPROIC ACID LEVEL: Valproic Acid Lvl: 106 ug/mL — ABNORMAL HIGH (ref 50–100)

## 2020-09-18 MED ORDER — CEPHALEXIN 500 MG PO CAPS: 500.0000 mg | ORAL_CAPSULE | Freq: Two times a day (BID) | ORAL | 0 refills | Status: AC

## 2020-09-18 NOTE — Addendum Note (Signed)
Addended by: Alger Simons T on: 09/18/2020 11:51 AM   Modules accepted: Orders

## 2020-09-21 ENCOUNTER — Telehealth: Payer: Self-pay | Admitting: Neurology

## 2020-09-21 NOTE — Telephone Encounter (Signed)
Patient's wife called and stated that patient is having some strange episodes of blinking his eyes repeatedly, he seems to be able to converse with her during these episodes, he has been a little confused sometimes however he has another UTI and he is being treated for it but otherwise at baseline.  She is afraid that these blinking episodes are seizures.  She states that she has tried eyedrops to see if he is having some sort of eye discomfort but he still continues although not repeatedly, he is currently not blinking.  When I spoke with her he was at baseline and cognitively he had improved since decreasing the dose of Depakote.  She reports that he was initially on Keppra and that he was doing well however he had agitation, so she met with Dr. April Manson who changed him to Depakote however the dose was recently decreased due to high Depakote levels and patient sedation.  At this time I told patient to hold on the same Depakote level, to watch him, some of this unusual behavior may be due to his UTI.  His Depakote level was 106 and at that time his Depakote dose was 1000 mg twice daily, it was decreased to 500 mg twice daily.  I advised her to keep treating him for the UTI, call me if patient's mentation changes or he does have a seizure and at that time we can decide whether to increase the Depakote slightly or add on a little bit of the Keppra she already has at home.  I will let Dr. April Manson know so he can give her a call and advise.

## 2020-09-23 LAB — URINE CULTURE

## 2020-09-23 NOTE — Telephone Encounter (Signed)
Thank you Dr. Jaynee Eagles, I will follow up with the patient .

## 2020-09-24 MED ORDER — SULFAMETHOXAZOLE-TRIMETHOPRIM 400-80 MG PO TABS: 1.0000 | ORAL_TABLET | Freq: Two times a day (BID) | ORAL | 0 refills | Status: AC

## 2020-09-24 NOTE — Telephone Encounter (Signed)
E coli uti resistant to keflex. I called mrs Tibbetts yesterday and told her I would send in a bactrim rx if bug was R to keflex.

## 2020-09-25 ENCOUNTER — Encounter (HOSPITAL_COMMUNITY): Payer: Self-pay | Admitting: Occupational Therapy

## 2020-09-25 ENCOUNTER — Ambulatory Visit (HOSPITAL_COMMUNITY): Payer: Medicare Other | Attending: Physical Medicine & Rehabilitation | Admitting: Occupational Therapy

## 2020-09-25 ENCOUNTER — Other Ambulatory Visit: Payer: Self-pay

## 2020-09-25 DIAGNOSIS — R278 Other lack of coordination: Secondary | ICD-10-CM | POA: Diagnosis present

## 2020-09-25 DIAGNOSIS — R29898 Other symptoms and signs involving the musculoskeletal system: Secondary | ICD-10-CM | POA: Diagnosis not present

## 2020-09-25 DIAGNOSIS — D329 Benign neoplasm of meninges, unspecified: Secondary | ICD-10-CM | POA: Diagnosis present

## 2020-09-25 DIAGNOSIS — M6281 Muscle weakness (generalized): Secondary | ICD-10-CM | POA: Diagnosis present

## 2020-09-26 NOTE — Therapy (Signed)
[place], [place], [address] Phone: [phone]   Fax:  [phone]  Occupational Therapy Evaluation  Patient Details  Name: Anthony Downs MRN: [id number] Date of Birth: 11-14-1950 Referring Provider (OT): Dr. Alger Simons   Encounter Date: 09/25/2020   OT End of Session - 09/25/20 1251     Visit Number 1    Number of Visits 8    Date for OT Re-Evaluation 10/25/20    Authorization Type 1) Medicare 2) [employer]    Authorization Time Period 30 visit limit    Authorization - Visit Number 1    Authorization - Number of Visits 30    Progress Note Due on Visit 10    OT Start Time 1118    OT Stop Time 1155    OT Time Calculation (min) 37 min    Activity Tolerance Patient tolerated treatment well    Behavior During Therapy WFL for tasks assessed/performed             Past Medical History:  Diagnosis Date   Acid reflux    Anxiety    Arthritis    Asthma    as a child   Benign brain tumor ([place])    Cancer ([place])    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, squamous behind ear   Complication of anesthesia    slow to wake up and pain medicines make him sick   GERD (gastroesophageal reflux disease)    Hypercholesteremia    Hypertension    PONV (postoperative nausea and vomiting)    Sleep apnea    no Cpap   Subdural hematoma, post-traumatic ([place]) 2008   fell off truck hit head on concrete    Past Surgical History:  Procedure Laterality Date   APPLICATION OF CRANIAL NAVIGATION Left 04/29/2020   Procedure: [place];  Surgeon: Judith Part, MD;  Location: [facility];  Service: Neurosurgery;  Laterality: Left;   arthroscopic knee     COLONOSCOPY N/A 11/29/2018   Procedure: COLONOSCOPY;  Surgeon: Rogene Houston, MD;  [facility];  Service: Endoscopy;  Laterality: N/A;  730-rescheduled 9/2 same time per Ann   CRANIOTOMY Left 04/29/2020   Procedure: LEFT CRANIECTOMY WITH  TUMOR EXCISION;  Surgeon: Judith Part, MD;  Location: [facility];  Service: Neurosurgery;  Laterality: Left;   KNEE SURGERY Right    NASAL SEPTOPLASTY W/ TURBINOPLASTY Bilateral 03/19/2020   Procedure: NASAL SEPTOPLASTY WITH BILATERAL  TURBINATE REDUCTION;  Surgeon: Leta Baptist, MD;  [facility];  Service: ENT;  Laterality: Bilateral;   VASECTOMY      There were no vitals filed for this visit.   Subjective Assessment - 09/25/20 1241     Subjective  S: That's correct.    Patient is accompanied by: Family member   wife   Pertinent History Pt is a 70 y/o male s/p meningioma resection on 04/29/20 with residual tetraplegia presenting with right side weakness. Pt's wife assisting with PLOF information, reports after CIR stay pt was able to use RUE to assist with ADLs and could functionally use his hand. Pt then had a seizure and his functioning has declined. Pt was referred to occupational therapy for evaluation and treatment by Dr. Alger Simons.    Patient Stated Goals To be able to use my right arm.    Currently in Pain? Yes    Pain Score 4     Pain Location Hand  Pain Orientation Right    Pain Descriptors / Indicators Sore    Pain Type Acute pain    Pain Radiating Towards wrist    Pain Onset More than a month ago    Pain Frequency Intermittent    Aggravating Factors  wrist mobility    Pain Relieving Factors rest    Effect of Pain on Daily Activities max effect on functional use of RUE during ADLs    Multiple Pain Sites No               [facility] OT Assessment - 09/25/20 1117       Assessment   Medical Diagnosis meningioma with tetraplegia    Referring Provider (OT) Dr. Alger Simons    Onset Date/Surgical Date 04/29/20   s/p resection   Hand Dominance Right    Next MD Visit 11/26/20    Prior Therapy CIR, Home Health OT      Precautions   Precautions Fall      Restrictions   Weight Bearing Restrictions No      Balance Screen   Has the patient fallen in the past 6  months No    Has the patient had a decrease in activity level because of a fear of falling?  No    Is the patient reluctant to leave their home because of a fear of falling?  No      Prior Function   Level of Independence Independent      ADL   ADL comments Pt is not using RUE volitionally, will try to use when prompted. Wife reports pt actively engages in ADLs such as dressing, bathing, and grooming, does get up daily and sit in his recliner, goes out in power chair when feeling well enough. Pt sponge bathes as they are unable to transfer him into the walk-in shower.      Mobility   Mobility Status Needs assist    Mobility Status Comments Wife uses Stedy lift for transfers as he is unable to complete a stand-pivot, has a lift chair. He assists in pulling to stand with his LUE, was using RUE until pain began to increase.      Written Expression   Dominant Hand Right      Cognition   Overall Cognitive Status Within Functional Limits for tasks assessed      Coordination   Gross Motor Movements are Fluid and Coordinated No    Fine Motor Movements are Fluid and Coordinated No      Tone   Assessment Location Right Upper Extremity      ROM / Strength   AROM / PROM / Strength AROM;Strength;PROM      AROM   Overall AROM Comments Pt unable to complete shoulder ROM due to weakness, elbow ROM is WFL.      PROM   Overall PROM Comments P/ROM of shoulder is approximately 50%, elbow is WFL, wrist is ~25%, digits ~25% due to pain      Strength   Overall Strength Comments Unable to assess grip and pinch strength due to stiffness and pain    Strength Assessment Site Shoulder;Elbow;Forearm;Wrist;Hand    Right/Left Shoulder Right    Right Shoulder Flexion 2-/5    Right Shoulder ABduction 1/5    Right Shoulder Internal Rotation 2-/5    Right Shoulder External Rotation 1/5    Right/Left Elbow Right    Right Elbow Flexion 3-/5    Right Elbow Extension 3-/5    Right/Left Forearm Left  Left  Forearm Pronation 2-/5    Left Forearm Supination 2-/5    Right/Left Wrist Left    Left Wrist Flexion 2-/5    Left Wrist Extension 2-/5    Left Wrist Radial Deviation 2-/5    Left Wrist Ulnar Deviation 2-/5      RUE Tone   RUE Tone Modified Ashworth      RUE Tone   Modified Ashworth Scale for Grading Hypertonia RUE More marked increase in muscle tone through most of the ROM, but affected part(s) easily moved                              OT Education - 09/26/20 0716     Education Details Discussed POC with pt and wife; educated on edema glove wear    Person(s) Educated Patient;Spouse    Methods Explanation;Demonstration    Comprehension Verbalized understanding              OT Short Term Goals - 09/25/20 1354       OT SHORT TERM GOAL #1   Title Pt will be provided with and educated on HEP to improve mobility of RUE required for incorporating into ADL tasks.    Time 4    Period Weeks    Status New    Target Date 10/25/20      OT SHORT TERM GOAL #2   Title Pt will increase functional use of RUE by 25% to improve ability to incorporate RUE into ADL tasks such as dressing and bathing.    Time 4    Period Weeks    Status New      OT SHORT TERM GOAL #3   Title Pt will be educated on adaptive strategies and techniques to improve independence in ADL completion.    Time 4    Period Weeks    Status New      OT SHORT TERM GOAL #4   Title Pt will be educated on weightbearing techniques to facilitate NMR and decrease pain in RUE.    Time 4    Period Weeks    Status New      OT SHORT TERM GOAL #5   Title Pt will be educated on available AE and DME to assist with ADL completion as needed for improved independence.    Time 4    Period Weeks    Status New                      Plan - 09/25/20 1252     Clinical Impression Statement A: Pt is a 70 y/o male s/p meningioma resection and residual tetraplegia resulting in right-side weakness.  Pt presenting in loaner power chair, reports his chair should be arriving this week. Pt with very limited functioning of RUE, OT notes swelling and pt reporting pain, fitted with size medium edema glove to limit further edema. Pt's wife reporting pt's functioning has declined since his seizure after discharging from CIR. Wife also reports severe redness at ischial tuberosities after sitting for an extended period of time, she is unsure if he is receiving a cushion with his new power chair. She purchased a cushion to trial however today is the first day he is using it, pt verbalizing discomfort towards end of session.    OT Occupational Profile and History Problem Focused Assessment - Including review of records relating to presenting problem    Occupational performance  deficits (Please refer to evaluation for details): ADL's;IADL's;Rest and Sleep;Leisure    Body Structure / Function / Physical Skills ADL;Endurance;UE functional use;Fascial restriction;Pain;ROM;Sensation;IADL;Skin integrity;Strength;Edema;Tone    Rehab Potential Fair    Clinical Decision Making Several treatment options, min-mod task modification necessary    Comorbidities Affecting Occupational Performance: May have comorbidities impacting occupational performance    Modification or Assistance to Complete Evaluation  Min-Moderate modification of tasks or assist with assess necessary to complete eval    OT Frequency 2x / week    OT Duration 4 weeks    OT Treatment/Interventions Self-care/ADL training;Ultrasound;Energy conservation;DME and/or AE instruction;Patient/family education;Passive range of motion;Cryotherapy;Electrical Stimulation;Splinting;Functional Mobility Training;Moist Heat;Therapeutic exercise;Manual Therapy;Therapeutic activities    Plan P: Pt will benefit from skilled OT services to increase functional use of RUE and incorporating into ADLs, and improve independence in ADL completion. Treatment plan: NMES, weightbearing,  ADL training/education, RUE P/ROM, A/ROM, and strengthening    OT Home Exercise Plan eval: self-ROM for protraction, elbow flexion/extension, and wrist flexion/extension; provided medium edema glove for right hand    Consulted and Agree with Plan of Care Patient             Patient will benefit from skilled therapeutic intervention in order to improve the following deficits and impairments:   Body Structure / Function / Physical Skills: ADL, Endurance, UE functional use, Fascial restriction, Pain, ROM, Sensation, IADL, Skin integrity, Strength, Edema, Tone       Visit Diagnosis: Other symptoms and signs involving the musculoskeletal system  Other lack of coordination    Problem List Patient Active Problem List   Diagnosis Date Noted   Seizure ([place]) 08/13/2020   Seizures ([place]) 08/12/2020   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   DVT (deep venous thrombosis) ([place]) 08/12/2020   COVID-19 virus infection 08/12/2020   Acute lower UTI 07/14/2020   Delirium 07/06/2020   Sleep apnea 07/06/2020   GERD (gastroesophageal reflux disease) 07/06/2020   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[place]) 06/25/2020   Sundowning 06/25/2020   Slow transit constipation    Essential hypertension    Hypokalemia    Postoperative pain    Meningioma ([place]) 05/02/2020   Brain tumor ([place]) 04/28/2020   S/P nasal septoplasty 03/19/2020   Special screening for malignant neoplasms, colon 01/09/2018    Guadelupe Sabin, OTR/L  [phone] 09/26/2020, 9:08 AM  [place], [place], [address] Phone: [phone]   Fax:  [phone]  Name: Anthony Downs MRN: [id number] Date of Birth: [birth year]

## 2020-09-28 ENCOUNTER — Other Ambulatory Visit: Payer: Self-pay | Admitting: Physical Medicine and Rehabilitation

## 2020-09-29 ENCOUNTER — Other Ambulatory Visit: Payer: Self-pay

## 2020-09-29 ENCOUNTER — Ambulatory Visit (HOSPITAL_COMMUNITY): Payer: Medicare Other | Admitting: Occupational Therapy

## 2020-09-29 DIAGNOSIS — D329 Benign neoplasm of meninges, unspecified: Secondary | ICD-10-CM

## 2020-09-29 DIAGNOSIS — R29898 Other symptoms and signs involving the musculoskeletal system: Secondary | ICD-10-CM | POA: Diagnosis not present

## 2020-09-29 DIAGNOSIS — R278 Other lack of coordination: Secondary | ICD-10-CM

## 2020-09-29 NOTE — Therapy (Signed)
[place], [place], [address] Phone: [phone]   Fax:  [phone]  Occupational Therapy Treatment  Patient Details  Name: JAMOR AJAYI MRN: [id number] Date of Birth: [birth year] Referring Provider (OT): Dr. Alger Simons   Encounter Date: 09/29/2020   OT End of Session - 09/29/20 1236     Visit Number 2    Number of Visits 8    Date for OT Re-Evaluation 10/25/20    Authorization Type 1) Medicare 2) UHC    Authorization Time Period 30 visit limit    Authorization - Visit Number 2    Authorization - Number of Visits 30    Progress Note Due on Visit 10    OT Start Time [id number]    OT Stop Time 1027    OT Time Calculation (min) 41 min    Activity Tolerance Patient tolerated treatment well    Behavior During Therapy WFL for tasks assessed/performed             Past Medical History:  Diagnosis Date   Acid reflux    Anxiety    Arthritis    Asthma    as a child   Benign brain tumor ([place])    Cancer ([place])    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, squamous behind ear   Complication of anesthesia    slow to wake up and pain medicines make him sick   GERD (gastroesophageal reflux disease)    Hypercholesteremia    Hypertension    PONV (postoperative nausea and vomiting)    Sleep apnea    no Cpap   Subdural hematoma, 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   fell off truck hit head on concrete    Past Surgical History:  Procedure Laterality Date   APPLICATION OF CRANIAL NAVIGATION Left 04/29/2020   Procedure: [place];  Surgeon: Judith Part, MD;  Location: [place] [facility];  Service: Neurosurgery;  Laterality: Left;   arthroscopic knee     COLONOSCOPY N/A 11/29/2018   Procedure: COLONOSCOPY;  Surgeon: Rogene Houston, MD;  [facility];  Service: Endoscopy;  Laterality: N/A;  730-rescheduled 9/2 same time per Ann   CRANIOTOMY Left 04/29/2020   Procedure: LEFT CRANIECTOMY WITH  TUMOR EXCISION;  Surgeon: Judith Part, MD;  Location: [facility];  Service: Neurosurgery;  Laterality: Left;   KNEE SURGERY Right    NASAL SEPTOPLASTY W/ TURBINOPLASTY Bilateral 03/19/2020   Procedure: NASAL SEPTOPLASTY WITH BILATERAL  TURBINATE REDUCTION;  Surgeon: Leta Baptist, MD;  [facility];  Service: ENT;  Laterality: Bilateral;   VASECTOMY      There were no vitals filed for this visit.   Subjective Assessment - 09/29/20 0949     Subjective  S: No pain.    Currently in Pain? Yes   during end range of P/ROM   Pain Score 8     Pain Location Shoulder   same for wrist and elbow   Pain Orientation Right    Pain Descriptors / Indicators Aching    Pain Type Acute pain    Pain Radiating Towards to wrist    Pain Onset More than a month ago    Pain Frequency Intermittent    Aggravating Factors  mobility    Pain Relieving Factors rest    Effect of Pain on Daily Activities max effect                [facility] OT Assessment - 09/29/20  0001       Assessment   Medical Diagnosis meningioma with tetraplegia    Referring Provider (OT) Dr. Alger Simons      Precautions   Precautions Fall                      OT Treatments/Exercises (OP) - 09/29/20 0001       Exercises   Exercises Shoulder;Elbow;Wrist      Shoulder Exercises: Seated   Protraction PROM;Right;5 reps    Horizontal ABduction PROM;5 reps;Right    External Rotation PROM;5 reps;Right    Internal Rotation PROM;Right;5 reps;AAROM;10 reps;Other (comment)   x5 A/AROM table slides using pillow case for first 5 then another 5 with kids prone scooter to decrease friction and increase movement.   Flexion PROM;Right;5 reps;AAROM   scooter and pillow case used for A/AROM table slides x5   Abduction PROM;Right;5 reps      Elbow Exercises   Elbow Extension PROM;5 reps;Right;AAROM   x5 P/ROM flexion as well; x 10 A/AROM table slide using pillow case and prone scoother with rest after 5 repts                      OT Short Term Goals - 09/29/20 1241       OT SHORT TERM GOAL #1   Title Pt will be provided with and educated on HEP to improve mobility of RUE required for incorporating into ADL tasks.    Time 4    Period Weeks    Status On-going    Target Date 10/25/20      OT SHORT TERM GOAL #2   Title Pt will increase functional use of RUE by 25% to improve ability to incorporate RUE into ADL tasks such as dressing and bathing.    Time 4    Period Weeks    Status On-going      OT SHORT TERM GOAL #3   Title Pt will be educated on adaptive strategies and techniques to improve independence in ADL completion.    Time 4    Period Weeks    Status On-going      OT SHORT TERM GOAL #4   Title Pt will be educated on weightbearing techniques to facilitate NMR and decrease pain in RUE.    Time 4    Period Weeks    Status On-going      OT SHORT TERM GOAL #5   Title Pt will be educated on available AE and DME to assist with ADL completion as needed for improved independence.    Time 4    Period Weeks    Status On-going                      Plan - 09/29/20 1236     Clinical Impression Statement A: Pt tolerated seated P/ROM with shoulder flexion and abduction limited to ~90* due to pain. Pt was limited in elbow and wrist as well due to reports of 8 to 9 out of 10 pain during movement. Pt engaged in towel slides at table top while seated in power chair with mod A needed for A/AROM and grading of task to pt using child's prone scooter under arm to decrease friction and increase active movement, which it did. Pt is able to flex elbow and internally rotate shouler better than extending elbow  and externally rotating shoulder. Pt was provided HEP on table slides. Tactile, verbal cuing, and frequent  rest breaks needed during session.    Occupational performance deficits (Please refer to evaluation for details): ADL's;IADL's;Rest and Sleep;Leisure    Body Structure / Function  / Physical Skills ADL;Endurance;UE functional use;Fascial restriction;Pain;ROM;Sensation;IADL;Skin integrity;Strength;Edema;Tone    OT Treatment/Interventions Self-care/ADL training;Ultrasound;Energy conservation;DME and/or AE instruction;Patient/family education;Passive range of motion;Cryotherapy;Electrical Stimulation;Splinting;Functional Mobility Training;Moist Heat;Therapeutic exercise;Manual Therapy;Therapeutic activities    Plan P: Continue P/ROM followed by table slides possibly using NMES while pillow case or scooter to assist with mobility. Attempt weight bearing.    OT Home Exercise Plan eval: self-ROM for protraction, elbow flexion/extension, and wrist flexion/extension; provided medium edema glove for right hand; 9/12: table slides    Consulted and Agree with Plan of Care Patient             Patient will benefit from skilled therapeutic intervention in order to improve the following deficits and impairments:   Body Structure / Function / Physical Skills: ADL, Endurance, UE functional use, Fascial restriction, Pain, ROM, Sensation, IADL, Skin integrity, Strength, Edema, Tone       Visit Diagnosis: Other symptoms and signs involving the musculoskeletal system  Other lack of coordination  Meningioma [facility])    Problem List Patient Active Problem List   Diagnosis Date Noted   Seizure ([place]) 08/13/2020   Seizures ([place]) 08/12/2020   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   DVT (deep venous thrombosis) ([place]) 08/12/2020   COVID-19 virus infection 08/12/2020   Acute lower UTI 07/14/2020   Delirium 07/06/2020   Sleep apnea 07/06/2020   GERD (gastroesophageal reflux disease) 07/06/2020   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[place]) 06/25/2020   Sundowning 06/25/2020   Slow transit constipation    Essential hypertension    Hypokalemia    Postoperative pain    Meningioma ([place]) 05/02/2020   Brain tumor ([place]) 04/28/2020   S/P nasal septoplasty 03/19/2020   Special screening for malignant neoplasms, colon  01/09/2018   Larey Seat OT, MOT  Larey Seat, OT/L 09/29/2020, 12:41 PM  [place], [place], [address] Phone: [phone]   Fax:  [phone]  Name: BRNDON MIYA MRN: [id number] Date of Birth: 1950-02-06

## 2020-09-29 NOTE — Patient Instructions (Signed)
1) SHOULDER: Flexion On Table     PLACE TOWEL UNDER ARM TO ASSIST   Place hands on towel placed on table, elbows straight. Lean forward with you upper body, pushing towel away from body.  __5 to 10_ reps per set, __2_ sets per day  2) Abduction (Passive)   With arm out to side, resting on towel placed on table with palm DOWN, keeping trunk away from table, lean to the side while pushing towel away from body.  Repeat __6 to 10__ times. Do _2___ sessions per day.  Copyright  VHI. All rights reserved.     3) Internal Rotation (Assistive)   Seated with elbow bent at right angle and held against side, slide arm on table surface in an inward arc keeping elbow anchored in place. Repeat __5 to 10__ times. Do __2__ sessions per day. Activity: Use this motion to brush crumbs off the table.  Copyright  VHI. All rights reserved.

## 2020-10-02 ENCOUNTER — Other Ambulatory Visit: Payer: Self-pay

## 2020-10-02 ENCOUNTER — Telehealth: Payer: Self-pay | Admitting: Nurse Practitioner

## 2020-10-02 ENCOUNTER — Ambulatory Visit (HOSPITAL_COMMUNITY): Payer: Medicare Other | Admitting: Physical Therapy

## 2020-10-02 DIAGNOSIS — R29898 Other symptoms and signs involving the musculoskeletal system: Secondary | ICD-10-CM | POA: Diagnosis not present

## 2020-10-02 DIAGNOSIS — D329 Benign neoplasm of meninges, unspecified: Secondary | ICD-10-CM

## 2020-10-02 DIAGNOSIS — M6281 Muscle weakness (generalized): Secondary | ICD-10-CM

## 2020-10-02 NOTE — Telephone Encounter (Signed)
Attempted to contact patient's wife to offer to schedule a Palliative Consult, no answer - left message with my contact information, requesting a return call

## 2020-10-02 NOTE — Therapy (Signed)
[place], [place], [address] Phone: [phone]   Fax:  [phone]  Physical Therapy Evaluation  Patient Details  Name: Anthony Downs MRN: [id number] Date of Birth: 04-04-1950 Alger Simons   Encounter Date: 10/02/2020   PT End of Session - 10/02/20 1314     Visit Number 1    Number of Visits 8    Date for PT Re-Evaluation 11/01/20    Authorization Type medicare    Progress Note Due on Visit 8    PT Start Time [id number]    PT Stop Time 1215    PT Time Calculation (min) 40 min             Past Medical History:  Diagnosis Date   Acid reflux    Anxiety    Arthritis    Asthma    as a child   Benign brain tumor ([place])    Cancer ([place])    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, squamous behind ear   Complication of anesthesia    slow to wake up and pain medicines make him sick   GERD (gastroesophageal reflux disease)    Hypercholesteremia    Hypertension    PONV (postoperative nausea and vomiting)    Sleep apnea    no Cpap   Subdural hematoma, 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   fell off truck hit head on concrete    Past Surgical History:  Procedure Laterality Date   APPLICATION OF CRANIAL NAVIGATION Left 04/29/2020   Procedure: [place];  Surgeon: Judith Part, MD;  Location: [facility];  Service: Neurosurgery;  Laterality: Left;   arthroscopic knee     COLONOSCOPY N/A 11/29/2018   Procedure: COLONOSCOPY;  Surgeon: Rogene Houston, MD;  [facility];  Service: Endoscopy;  Laterality: N/A;  730-rescheduled 9/2 same time per Ann   CRANIOTOMY Left 04/29/2020   Procedure: LEFT CRANIECTOMY WITH TUMOR EXCISION;  Surgeon: Judith Part, MD;  Location: [facility];  Service: Neurosurgery;  Laterality: Left;   KNEE SURGERY Right    NASAL SEPTOPLASTY W/ TURBINOPLASTY Bilateral 03/19/2020   Procedure: NASAL SEPTOPLASTY WITH BILATERAL  TURBINATE REDUCTION;  Surgeon: Leta Baptist,  MD;  [facility];  Service: ENT;  Laterality: Bilateral;   VASECTOMY      There were no vitals filed for this visit.    Subjective Assessment - 10/02/20 1138     Subjective Pt is a 70 y/o male s/p meningioma resection on 04/29/20 with residual tetraplegia presenting with right side weakness. Pt's wife assisting with PLOF information, reports after CIR stay pt was standing on a walker but could not take a step.  They put him in an unweighting machine but moved his legs for him. He was discharged from rehab on 5/31.  He has been in the hospital 2 x since this incident , one due to a UTI the second was due to a  seizure.  HIs wife is currently using a steady to assist him to transfer.  She use to just stabilize his feet but now he has to assist him going sit to stand as well.  Pallative care will be coming to the home on October the 3rd to speak with the patient and  wife.    Pertinent History anxiety, OA, cancer, subdural hematoma    How long can you sit comfortably? no problem    How long can you stand comfortably? can not  How long can you walk comfortably? can not    Currently in Pain? --   "just miserable"  unable to vocalize exactly what is going on.               [facility] PT Assessment - 10/02/20 0001       Assessment   Medical Diagnosis meningioma with tetraplegia    Onset Date/Surgical Date 04/29/20   s/p resection   Hand Dominance Right    Next MD Visit 11/26/20    Prior Therapy CIR, Home Health PT      Precautions   Precautions Fall      Restrictions   Weight Bearing Restrictions No      Balance Screen   Has the patient fallen in the past 6 months No    Has the patient had a decrease in activity level because of a fear of falling?  Yes    Is the patient reluctant to leave their home because of a fear of falling?  Yes      Prior Function   Level of Independence Independent      Cognition   Overall Cognitive Status Within Functional Limits for tasks assessed       Strength   Strength Assessment Site Hip;Knee;Ankle    Right/Left Hip Right;Left    Right Hip Flexion 0/5    Right Hip Extension 0/5    Right Hip ABduction 0/5    Right Hip ADduction 0/5    Left Hip Extension 1/5    Left Hip ABduction 1/5    Left Hip ADduction 1/5    Right/Left Knee Right;Left    Right Knee Flexion 1/5    Right Knee Extension 0/5    Left Knee Flexion 2+/5    Left Knee Extension 3/5    Right/Left Ankle Right;Left    Right Ankle Dorsiflexion 1/5    Right Ankle Plantar Flexion 2-/5    Left Ankle Dorsiflexion 0/5    Left Ankle Plantar Flexion 0/5      Transfers   Transfers Sit to Stand;Stand to Sit    Stand to Sit 2: Max assist    Stand to Sit Details (indicate cue type and reason) --   elevated from pt wheelchair.   Number of Reps --   2 x max hold 45 seconds.     RUE Tone   RUE Tone --      RUE Tone   Modified Ashworth Scale for Grading Hypertonia RUE --                        Objective measurements completed on examination: See above findings.       [facility] Adult PT Treatment/Exercise - 10/02/20 0001       Exercises   Exercises Knee/Hip      Knee/Hip Exercises: Seated   Long Arc Quad Left;10 reps    Other Seated Knee/Hip Exercises PROM to Rt ankle and knee    Other Seated Knee/Hip Exercises glut set x 10    Marching Left;10 reps    Abduction/Adduction  Left;10 reps                     PT Education - 10/02/20 1314     Education Details HEP    Person(s) Educated Spouse    Methods Explanation    Comprehension Verbalized understanding              PT Short Term Goals -  10/02/20 1452       PT SHORT TERM GOAL #1   Title Pt I in HEP to improve LE strength to increase I in mobility    Time 2    Period Weeks    Status New    Target Date 10/16/20      PT SHORT TERM GOAL #2   Title Pt to be able to come sit to stand with min assist    Time 2    Period Weeks    Status New               PT Long Term  Goals - 10/02/20 1454       PT LONG TERM GOAL #1   Title Pt to be able to come sit to stand with mod I    Time 4    Period Weeks    Status New    Target Date 10/30/20      PT LONG TERM GOAL #2   Title PT to be able to transfer with min assist    Time 4    Period Weeks    Status New      PT LONG TERM GOAL #3   Title PT to be able to complete bed mobility with min to mod  assist    Time 4    Period Weeks    Status New                    Plan - 10/02/20 1316     Clinical Impression Statement Anthony Downs is a 70 yo who has been diagnosed with tetriplegia.  He has been seen by acut, IP and HH therapy.  Since he has been home from IP therapy he has been admitted into the hospital due to seizure.  Prior to the seizure the pt could stand I and assist with transfers.  At this time the pt is unable to do so and will benefit from skilled PT to improve his strength and balance to decrease the burden of care from his wife.    Personal Factors and Comorbidities Comorbidity 3+;Fitness;Time since onset of injury/illness/exacerbation    Comorbidities OA, CA, HTN anxiety    Examination-Activity Limitations Bathing;Bed Mobility;Bend;Caring for Others;Carry;Dressing;Hygiene/Grooming;Lift;Continence;Locomotion Level;Reach Overhead;Self Feeding;Sit;Squat;Stairs;Stand;Toileting;Transfers    Examination-Participation Restrictions Church;Cleaning;Community Activity;Driving;Interpersonal Relationship;Laundry;Medication Management;Meal Prep;Occupation;Personal Finances;School;Shop;Volunteer;Yard Work    Stability/Clinical Decision Making Stable/Uncomplicated    Clinical Decision Making Moderate    Rehab Potential Good    PT Frequency 2x / week    PT Duration 4 weeks    PT Treatment/Interventions Functional mobility training;Therapeutic activities;Therapeutic exercise;Gait training;Balance training;Passive range of motion;Patient/family education    PT Next Visit Plan Continue to work on sit to  stand, standing tolerance and strengthening , progress to transfers and bed mobility as able    PT Home Exercise Plan eval:  PROM To RT; Lt ankle pumps, LAQ, hip marching, hip ab/adduction and glut sets             Patient will benefit from skilled therapeutic intervention in order to improve the following deficits and impairments:  Decreased strength, Decreased mobility, Decreased balance, Difficulty walking  Visit Diagnosis: Meningioma ([place])  Muscle weakness (generalized)     Problem List Patient Active Problem List   Diagnosis Date Noted   Seizure ([place]) 08/13/2020   Seizures ([place]) 08/12/2020   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   DVT (deep venous thrombosis) ([place]) 08/12/2020   COVID-19 virus infection 08/12/2020   Acute lower UTI 07/14/2020  Delirium 07/06/2020   Sleep apnea 07/06/2020   GERD (gastroesophageal reflux disease) 07/06/2020   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[place]) 06/25/2020   Sundowning 06/25/2020   Slow transit constipation    Essential hypertension    Hypokalemia    Postoperative pain    Meningioma ([place]) 05/02/2020   Brain tumor ([place]) 04/28/2020   S/P nasal septoplasty 03/19/2020   Special screening for malignant neoplasms, colon 01/09/2018   Rayetta Humphrey, PT CLT [phone] , PT 10/02/2020, 2:57 PM  [facility] [address] [place], [place], [address] Phone: [phone]   Fax:  [phone]  Name: Anthony Downs MRN: [id number] Date of Birth: 1950/08/28

## 2020-10-03 ENCOUNTER — Ambulatory Visit (HOSPITAL_COMMUNITY): Payer: Medicare Other | Admitting: Occupational Therapy

## 2020-10-03 ENCOUNTER — Encounter (HOSPITAL_COMMUNITY): Payer: Self-pay | Admitting: Occupational Therapy

## 2020-10-03 DIAGNOSIS — R29898 Other symptoms and signs involving the musculoskeletal system: Secondary | ICD-10-CM | POA: Diagnosis not present

## 2020-10-03 DIAGNOSIS — R278 Other lack of coordination: Secondary | ICD-10-CM

## 2020-10-03 NOTE — Therapy (Signed)
[place], [place], [address] Phone: [phone]   Fax:  [phone]  Occupational Therapy Treatment  Patient Details  Name: Anthony Downs MRN: [id number] Date of Birth: Sep 10, 1950 Referring Provider (OT): Dr. Alger Simons   Encounter Date: 10/03/2020   OT End of Session - 10/03/20 1518     Visit Number 3    Number of Visits 8    Date for OT Re-Evaluation 10/25/20    Authorization Type 1) Medicare 2) UHC    Authorization Time Period 30 visit limit    Authorization - Visit Number 3    Authorization - Number of Visits 30    Progress Note Due on Visit 10    OT Start Time 1430    OT Stop Time 1508    OT Time Calculation (min) 38 min    Activity Tolerance Patient tolerated treatment well    Behavior During Therapy WFL for tasks assessed/performed             Past Medical History:  Diagnosis Date   Acid reflux    Anxiety    Arthritis    Asthma    as a child   Benign brain tumor ([place])    Cancer ([place])    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, squamous behind ear   Complication of anesthesia    slow to wake up and pain medicines make him sick   GERD (gastroesophageal reflux disease)    Hypercholesteremia    Hypertension    PONV (postoperative nausea and vomiting)    Sleep apnea    no Cpap   Subdural hematoma, 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   fell off truck hit head on concrete    Past Surgical History:  Procedure Laterality Date   APPLICATION OF CRANIAL NAVIGATION Left 04/29/2020   Procedure: [place];  Surgeon: Judith Part, MD;  Location: [facility];  Service: Neurosurgery;  Laterality: Left;   arthroscopic knee     COLONOSCOPY N/A 11/29/2018   Procedure: COLONOSCOPY;  Surgeon: Rogene Houston, MD;  [facility];  Service: Endoscopy;  Laterality: N/A;  730-rescheduled 9/2 same time per Ann   CRANIOTOMY Left 04/29/2020   Procedure: LEFT CRANIECTOMY WITH  TUMOR EXCISION;  Surgeon: Judith Part, MD;  Location: [facility];  Service: Neurosurgery;  Laterality: Left;   KNEE SURGERY Right    NASAL SEPTOPLASTY W/ TURBINOPLASTY Bilateral 03/19/2020   Procedure: NASAL SEPTOPLASTY WITH BILATERAL  TURBINATE REDUCTION;  Surgeon: Leta Baptist, MD;  [facility];  Service: ENT;  Laterality: Bilateral;   VASECTOMY      There were no vitals filed for this visit.   Subjective Assessment - 10/03/20 1428     Subjective  S: I wear it some.    Patient is accompanied by: Family member   wife   Currently in Pain? No/denies                [name] OT Assessment - 10/03/20 1428       Assessment   Medical Diagnosis meningioma with tetraplegia      Precautions   Precautions Fall                      OT Treatments/Exercises (OP) - 10/03/20 1512       Exercises   Exercises Shoulder;Elbow;Wrist      Elbow Exercises   Elbow Extension PROM;AROM;5 reps    Other elbow exercises  Elbow flexion, P/ROM 5X, A/ROM 5X      Neurological Re-education Exercises   Shoulder Flexion PROM;10 reps    Shoulder ABduction PROM;10 reps    Shoulder External Rotation PROM;10 reps    Shoulder Internal Rotation PROM;10 reps    Finger Flexion P/ROM 5x    Finger Extension P/ROM 5x    Other Exercises 1 Pt seated at kitchen table, hand and forearm positioned on towel on table, pt completing flexion table slide 5x    Weight Bearing Position Seated    Seated with weight on forearm Pt seated parallel to table in ADL kitchen, forearm positioned on black foam block and pt sitting tall to lean over onto forearm, 3x30"      Manual Therapy   Manual Therapy Edema management    Manual therapy comments complete separately from therapeutic exercises    Edema Management retrograde massage completed to right hand, forearm, elbow, and distal upper arm to decrease edema and improve RUE mobility                      OT Short Term Goals - 09/29/20 1241        OT SHORT TERM GOAL #1   Title Pt will be provided with and educated on HEP to improve mobility of RUE required for incorporating into ADL tasks.    Time 4    Period Weeks    Status On-going    Target Date 10/25/20      OT SHORT TERM GOAL #2   Title Pt will increase functional use of RUE by 25% to improve ability to incorporate RUE into ADL tasks such as dressing and bathing.    Time 4    Period Weeks    Status On-going      OT SHORT TERM GOAL #3   Title Pt will be educated on adaptive strategies and techniques to improve independence in ADL completion.    Time 4    Period Weeks    Status On-going      OT SHORT TERM GOAL #4   Title Pt will be educated on weightbearing techniques to facilitate NMR and decrease pain in RUE.    Time 4    Period Weeks    Status On-going      OT SHORT TERM GOAL #5   Title Pt will be educated on available AE and DME to assist with ADL completion as needed for improved independence.    Time 4    Period Weeks    Status On-going                      Plan - 10/03/20 1519     Clinical Impression Statement A: Pt arrived without cushion in chair, PT assisting with sit to stand to place pillow under pt and improve tolerance for session length. Pt completing weightbearing at tabletop, on forearm, OT cuing for proper form and RUE loading. Edema management completed to RUE as pt with increased edema at hand, forearm, and elbow today. Passive stretching to RUE completed, AA/ROM with towel slides and A/ROM elbow flexion/extension. Verbal and visual cuing for form and technique.    Body Structure / Function / Physical Skills ADL;Endurance;UE functional use;Fascial restriction;Pain;ROM;Sensation;IADL;Skin integrity;Strength;Edema;Tone    Plan P: weightbearing on forearm as pt is able to complete with good form. edema management, passive stretching. Trial NMES    OT Home Exercise Plan eval: self-ROM for protraction, elbow flexion/extension, and wrist  flexion/extension;  provided medium edema glove for right hand; 9/12: table slides    Consulted and Agree with Plan of Care Patient             Patient will benefit from skilled therapeutic intervention in order to improve the following deficits and impairments:   Body Structure / Function / Physical Skills: ADL, Endurance, UE functional use, Fascial restriction, Pain, ROM, Sensation, IADL, Skin integrity, Strength, Edema, Tone       Visit Diagnosis: Other symptoms and signs involving the musculoskeletal system  Other lack of coordination    Problem List Patient Active Problem List   Diagnosis Date Noted   Seizure ([place]) 08/13/2020   Seizures ([place]) 08/12/2020   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   DVT (deep venous thrombosis) ([place]) 08/12/2020   COVID-19 virus infection 08/12/2020   Acute lower UTI 07/14/2020   Delirium 07/06/2020   Sleep apnea 07/06/2020   GERD (gastroesophageal reflux disease) 07/06/2020   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[place]) 06/25/2020   Sundowning 06/25/2020   Slow transit constipation    Essential hypertension    Hypokalemia    Postoperative pain    Meningioma ([place]) 05/02/2020   Brain tumor ([place]) 04/28/2020   S/P nasal septoplasty 03/19/2020   Special screening for malignant neoplasms, colon 01/09/2018    Guadelupe Sabin, OTR/L  [phone] 10/03/2020, 3:21 PM  [place], [place], [address] Phone: [phone]   Fax:  [phone]  Name: Anthony Downs MRN: [id number] Date of Birth: Nov 14, 1950

## 2020-10-07 ENCOUNTER — Encounter (HOSPITAL_COMMUNITY): Payer: Self-pay

## 2020-10-07 ENCOUNTER — Ambulatory Visit (HOSPITAL_COMMUNITY): Payer: Medicare Other

## 2020-10-07 ENCOUNTER — Other Ambulatory Visit: Payer: Self-pay

## 2020-10-07 DIAGNOSIS — R29898 Other symptoms and signs involving the musculoskeletal system: Secondary | ICD-10-CM | POA: Diagnosis not present

## 2020-10-07 DIAGNOSIS — R278 Other lack of coordination: Secondary | ICD-10-CM

## 2020-10-07 NOTE — Therapy (Signed)
[place], [place], [address] Phone: [phone]   Fax:  [phone]  Occupational Therapy Treatment  Patient Details  Name: Anthony Downs MRN: [id number] Date of Birth: 1950/07/10 Referring Provider (OT): Dr. Alger Simons   Encounter Date: 10/07/2020   OT End of Session - 10/07/20 1717     Visit Number 4    Number of Visits 8    Date for OT Re-Evaluation 10/25/20    Authorization Type 1) Medicare 2) UHC    Authorization Time Period 30 visit limit    Authorization - Visit Number 4    Authorization - Number of Visits 30    Progress Note Due on Visit 10    OT Start Time 1345    OT Stop Time 1425    OT Time Calculation (min) 40 min    Activity Tolerance Patient tolerated treatment well    Behavior During Therapy WFL for tasks assessed/performed             Past Medical History:  Diagnosis Date   Acid reflux    Anxiety    Arthritis    Asthma    as a child   Benign brain tumor ([place])    Cancer ([place])    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, squamous behind ear   Complication of anesthesia    slow to wake up and pain medicines make him sick   GERD (gastroesophageal reflux disease)    Hypercholesteremia    Hypertension    PONV (postoperative nausea and vomiting)    Sleep apnea    no Cpap   Subdural hematoma, 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   fell off truck hit head on concrete    Past Surgical History:  Procedure Laterality Date   APPLICATION OF CRANIAL NAVIGATION Left 04/29/2020   Procedure: [place];  Surgeon: Judith Part, MD;  Location: [facility];  Service: Neurosurgery;  Laterality: Left;   arthroscopic knee     COLONOSCOPY N/A 11/29/2018   Procedure: COLONOSCOPY;  Surgeon: Rogene Houston, MD;  [facility];  Service: Endoscopy;  Laterality: N/A;  730-rescheduled 9/2 same time per Ann   CRANIOTOMY Left 04/29/2020   Procedure: LEFT CRANIECTOMY WITH  TUMOR EXCISION;  Surgeon: Judith Part, MD;  Location: [facility];  Service: Neurosurgery;  Laterality: Left;   KNEE SURGERY Right    NASAL SEPTOPLASTY W/ TURBINOPLASTY Bilateral 03/19/2020   Procedure: NASAL SEPTOPLASTY WITH BILATERAL  TURBINATE REDUCTION;  Surgeon: Leta Baptist, MD;  [facility];  Service: ENT;  Laterality: Bilateral;   VASECTOMY      There were no vitals filed for this visit.   Subjective Assessment - 10/07/20 1358     Subjective  S: I think the Steady is not helping my hand.    Patient is accompanied by: Family member   Wife   Currently in Pain? No/denies                [facility] OT Assessment - 10/07/20 1358       Assessment   Medical Diagnosis meningioma with tetraplegia      Precautions   Precautions Fall                      OT Treatments/Exercises (OP) - 10/07/20 1358       Exercises   Exercises Shoulder;Elbow;Wrist      Shoulder Exercises: Supine   Protraction PROM;AAROM;5 reps  External Rotation PROM;5 reps    Internal Rotation PROM;5 reps    Flexion PROM;5 reps;Limitations    Flexion Limitations to shoulder level      Shoulder Exercises: Isometric Strengthening   Flexion Supine;3X3"    Extension Supine;3X3"    External Rotation Supine;3X3"    Internal Rotation Supine;3X3"    ABduction Supine;3X3"    ADduction Supine;3X3"      Elbow Exercises   Other elbow exercises Elbow flexion, P/ROM 5X, A/ROM 5X      Wrist Exercises   Other wrist exercises P/ROM flexion/extension 5X      Modalities   Modalities Electrical Stimulation      Electrical Stimulation   Electrical Stimulation Location right wrist extensors    Electrical Stimulation Action Engineer, petroleum Parameters N/A. Unable to establish a therapeutic response    Electrical Stimulation Goals Neuromuscular facilitation      Manual Therapy   Manual Therapy Edema management    Manual therapy comments complete separately from therapeutic  exercises    Edema Management Lymphatic drainage massage completed to right hand, forearm, elbow, and distal upper arm to decrease edema and improve RUE mobility                      OT Short Term Goals - 09/29/20 1241       OT SHORT TERM GOAL #1   Title Pt will be provided with and educated on HEP to improve mobility of RUE required for incorporating into ADL tasks.    Time 4    Period Weeks    Status On-going    Target Date 10/25/20      OT SHORT TERM GOAL #2   Title Pt will increase functional use of RUE by 25% to improve ability to incorporate RUE into ADL tasks such as dressing and bathing.    Time 4    Period Weeks    Status On-going      OT SHORT TERM GOAL #3   Title Pt will be educated on adaptive strategies and techniques to improve independence in ADL completion.    Time 4    Period Weeks    Status On-going      OT SHORT TERM GOAL #4   Title Pt will be educated on weightbearing techniques to facilitate NMR and decrease pain in RUE.    Time 4    Period Weeks    Status On-going      OT SHORT TERM GOAL #5   Title Pt will be educated on available AE and DME to assist with ADL completion as needed for improved independence.    Time 4    Period Weeks    Status On-going                      Plan - 10/07/20 1718     Clinical Impression Statement A: Began session with manual lymphatic massage to right UE starting at shoulder working down to hand then back to shoulder. Passive ROM was completed to right shoulder, elbow, and wrist with powered wheelchair in full tilt position. AA/ROM completed to right shoulder with therapist unweighting extremity. Isometrics completed with mod difficulty initially to active appropriate muscles. Occasional rest breaks provided for repositioning as needed. Verbal and tactile cues was provided during session for form and technique. NMES was attempted towards end of session. patient was unable to achieve muscle  faciliation with movement of wrist into extension. Did  not feel ES until number was in the mid 20's and unable to increase due to experienced pain.    Body Structure / Function / Physical Skills ADL;Endurance;UE functional use;Fascial restriction;Pain;ROM;Sensation;IADL;Skin integrity;Strength;Edema;Tone    Plan P: weightbearing on forearm as pt is able to complete with good form. edema management, passive stretching. Trial NMES at start of session versus at the end.    Consulted and Agree with Plan of Care Patient;Family member/caregiver    Family Member Consulted Wife             Patient will benefit from skilled therapeutic intervention in order to improve the following deficits and impairments:   Body Structure / Function / Physical Skills: ADL, Endurance, UE functional use, Fascial restriction, Pain, ROM, Sensation, IADL, Skin integrity, Strength, Edema, Tone       Visit Diagnosis: Other lack of coordination  Other symptoms and signs involving the musculoskeletal system    Problem List Patient Active Problem List   Diagnosis Date Noted   Seizure ([employer]) 08/13/2020   Seizures ([place]) 08/12/2020   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   DVT (deep venous thrombosis) ([place]) 08/12/2020   COVID-19 virus infection 08/12/2020   Acute lower UTI 07/14/2020   Delirium 07/06/2020   Sleep apnea 07/06/2020   GERD (gastroesophageal reflux disease) 07/06/2020   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[place]) 06/25/2020   Sundowning 06/25/2020   Slow transit constipation    Essential hypertension    Hypokalemia    Postoperative pain    Meningioma ([place]) 05/02/2020   Brain tumor ([place]) 04/28/2020   S/P nasal septoplasty 03/19/2020   Special screening for malignant neoplasms, colon 01/09/2018    Ailene Ravel, OTR/L,CBIS  [phone]  10/07/2020, 5:23 PM  [place], [place], [address] Phone: [phone]   Fax:  [phone]  Name: Anthony Downs MRN: [id number] Date of Birth: 03/09/1950

## 2020-10-09 ENCOUNTER — Ambulatory Visit (HOSPITAL_COMMUNITY): Payer: Medicare Other | Admitting: Occupational Therapy

## 2020-10-09 ENCOUNTER — Encounter (HOSPITAL_COMMUNITY): Payer: Self-pay | Admitting: Occupational Therapy

## 2020-10-09 ENCOUNTER — Other Ambulatory Visit: Payer: Self-pay

## 2020-10-09 ENCOUNTER — Ambulatory Visit (HOSPITAL_COMMUNITY): Payer: Medicare Other | Admitting: Physical Therapy

## 2020-10-09 DIAGNOSIS — R29898 Other symptoms and signs involving the musculoskeletal system: Secondary | ICD-10-CM | POA: Diagnosis not present

## 2020-10-09 DIAGNOSIS — R278 Other lack of coordination: Secondary | ICD-10-CM

## 2020-10-09 NOTE — Therapy (Signed)
[place], [place], [address] Phone: [phone]   Fax:  [phone]  Occupational Therapy Treatment  Patient Details  Name: Anthony Downs MRN: [id number] Date of Birth: 03/23/1950 Referring Provider (OT): Dr. Alger Simons   Encounter Date: 10/09/2020   OT End of Session - 10/09/20 1733     Visit Number 5    Number of Visits 8    Date for OT Re-Evaluation 10/25/20    Authorization Type 1) Medicare 2) UHC    Authorization Time Period 30 visit limit    Authorization - Visit Number 5    Authorization - Number of Visits 30    Progress Note Due on Visit 10    OT Start Time 1517    OT Stop Time 1557    OT Time Calculation (min) 40 min    Activity Tolerance Patient tolerated treatment well    Behavior During Therapy WFL for tasks assessed/performed             Past Medical History:  Diagnosis Date   Acid reflux    Anxiety    Arthritis    Asthma    as a child   Benign brain tumor ([place])    Cancer ([place])    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, squamous behind ear   Complication of anesthesia    slow to wake up and pain medicines make him sick   GERD (gastroesophageal reflux disease)    Hypercholesteremia    Hypertension    PONV (postoperative nausea and vomiting)    Sleep apnea    no Cpap   Subdural hematoma, 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   fell off truck hit head on concrete    Past Surgical History:  Procedure Laterality Date   APPLICATION OF CRANIAL NAVIGATION Left 04/29/2020   Procedure: [place];  Surgeon: Judith Part, MD;  Location: [facility];  Service: Neurosurgery;  Laterality: Left;   arthroscopic knee     COLONOSCOPY N/A 11/29/2018   Procedure: COLONOSCOPY;  Surgeon: Rogene Houston, MD;  [facility];  Service: Endoscopy;  Laterality: N/A;  730-rescheduled 9/2 same time per Ann   CRANIOTOMY Left 04/29/2020   Procedure: LEFT CRANIECTOMY WITH  TUMOR EXCISION;  Surgeon: Judith Part, MD;  Location: [facility];  Service: Neurosurgery;  Laterality: Left;   KNEE SURGERY Right    NASAL SEPTOPLASTY W/ TURBINOPLASTY Bilateral 03/19/2020   Procedure: NASAL SEPTOPLASTY WITH BILATERAL  TURBINATE REDUCTION;  Surgeon: Leta Baptist, MD;  [facility];  Service: ENT;  Laterality: Bilateral;   VASECTOMY      There were no vitals filed for this visit.   Subjective Assessment - 10/09/20 1516     Subjective  S: Can we move on from this?    Patient is accompanied by: Family member   wife   Currently in Pain? No/denies                [facility] OT Assessment - 10/09/20 1516       Assessment   Medical Diagnosis meningioma with tetraplegia      Precautions   Precautions Fall                      OT Treatments/Exercises (OP) - 10/09/20 1731       Exercises   Exercises Shoulder;Elbow;Wrist      Elbow Exercises   Elbow Extension PROM;AROM;5 reps    Forearm  Supination PROM;5 reps    Forearm Pronation PROM;5 reps      Wrist Exercises   Wrist Flexion PROM;5 reps    Wrist Extension PROM;5 reps      Neurological Re-education Exercises   Shoulder Flexion PROM;10 reps    Shoulder ABduction PROM;10 reps    Shoulder External Rotation PROM;10 reps    Shoulder Internal Rotation PROM;10 reps    Finger Flexion P/ROM 5X    Finger Extension P/ROM 5X    Other Exercises 1 Seated at kitchen table, pt RUE placed on scooterboard and pt pushing into flexion and abduction 5X each    Other Exercises 2 Pt reaching forward to shake OTs hand using his right hand, then pulling back to his side. Completed 5X    Weight Bearing Position Seated    Seated with weight on forearm Pt seated parallel to table in ADL kitchen, forearm positioned on pillow and pt sitting tall to weightbear on forearm and move 10 rings from table to saebo tree using left hand. Pt then moved the rings from the bottom branch to the middle branch.      Manual Therapy    Manual Therapy Edema management    Manual therapy comments complete separately from therapeutic exercises    Edema Management Lymphatic drainage massage completed to right hand, forearm, elbow, and distal upper arm to decrease edema and improve RUE mobility                      OT Short Term Goals - 09/29/20 1241       OT SHORT TERM GOAL #1   Title Pt will be provided with and educated on HEP to improve mobility of RUE required for incorporating into ADL tasks.    Time 4    Period Weeks    Status On-going    Target Date 10/25/20      OT SHORT TERM GOAL #2   Title Pt will increase functional use of RUE by 25% to improve ability to incorporate RUE into ADL tasks such as dressing and bathing.    Time 4    Period Weeks    Status On-going      OT SHORT TERM GOAL #3   Title Pt will be educated on adaptive strategies and techniques to improve independence in ADL completion.    Time 4    Period Weeks    Status On-going      OT SHORT TERM GOAL #4   Title Pt will be educated on weightbearing techniques to facilitate NMR and decrease pain in RUE.    Time 4    Period Weeks    Status On-going      OT SHORT TERM GOAL #5   Title Pt will be educated on available AE and DME to assist with ADL completion as needed for improved independence.    Time 4    Period Weeks    Status On-going                      Plan - 10/09/20 1734     Clinical Impression Statement A: Pt had a great session today, continued with manual edema management techniques to RUE at beginning of session and pt was able to tolerate greater ROM during passive stretching after. Resumed weightbearing with pt reaching to complete a task with left hand while weightbearing on RUE. Pt also completing AA/ROM and A/ROM with RUE, reaching to shake OTs hand today.  Body Structure / Function / Physical Skills ADL;Endurance;UE functional use;Fascial restriction;Pain;ROM;Sensation;IADL;Skin  integrity;Strength;Edema;Tone    Plan P: weightbearing on forearm as pt is able to complete with good form. edema management, passive stretching. Attempt AA/ROM using dowel rod    OT Home Exercise Plan eval: self-ROM for protraction, elbow flexion/extension, and wrist flexion/extension; provided medium edema glove for right hand; 9/12: table slides    Consulted and Agree with Plan of Care Patient;Family member/caregiver    Family Member Consulted Wife             Patient will benefit from skilled therapeutic intervention in order to improve the following deficits and impairments:   Body Structure / Function / Physical Skills: ADL, Endurance, UE functional use, Fascial restriction, Pain, ROM, Sensation, IADL, Skin integrity, Strength, Edema, Tone       Visit Diagnosis: Other lack of coordination  Other symptoms and signs involving the musculoskeletal system    Problem List Patient Active Problem List   Diagnosis Date Noted   Seizure ([place]) 08/13/2020   Seizures ([place]) 08/12/2020   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   DVT (deep venous thrombosis) ([place]) 08/12/2020   COVID-19 virus infection 08/12/2020   Acute lower UTI 07/14/2020   Delirium 07/06/2020   Sleep apnea 07/06/2020   GERD (gastroesophageal reflux disease) 07/06/2020   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[place]) 06/25/2020   Sundowning 06/25/2020   Slow transit constipation    Essential hypertension    Hypokalemia    Postoperative pain    Meningioma ([place]) 05/02/2020   Brain tumor ([place]) 04/28/2020   S/P nasal septoplasty 03/19/2020   Special screening for malignant neoplasms, colon 01/09/2018    Guadelupe Sabin, OTR/L  [phone] 10/09/2020, 5:36 PM  [place], [place], [address] Phone: [phone]   Fax:  [phone]  Name: Anthony Downs MRN: [id number] Date of Birth: 1950-11-23

## 2020-10-10 ENCOUNTER — Ambulatory Visit (HOSPITAL_COMMUNITY): Payer: Medicare Other

## 2020-10-10 DIAGNOSIS — D329 Benign neoplasm of meninges, unspecified: Secondary | ICD-10-CM

## 2020-10-10 DIAGNOSIS — R278 Other lack of coordination: Secondary | ICD-10-CM

## 2020-10-10 DIAGNOSIS — R29898 Other symptoms and signs involving the musculoskeletal system: Secondary | ICD-10-CM | POA: Diagnosis not present

## 2020-10-10 DIAGNOSIS — M6281 Muscle weakness (generalized): Secondary | ICD-10-CM

## 2020-10-10 NOTE — Therapy (Signed)
[place], [place], [address] Phone: [phone]   Fax:  [phone]  Physical Therapy Treatment  Patient Details  Name: Anthony Downs MRN: [id number] Date of Birth: Dec 05, 1950 Referring Provider (PT): Alger Simons   Encounter Date: 10/10/2020   PT End of Session - 10/10/20 1122     Visit Number 2    Number of Visits 8    Date for PT Re-Evaluation 11/01/20    Authorization Type medicare    Progress Note Due on Visit 8    PT Start Time 1030    PT Stop Time 1115    PT Time Calculation (min) 45 min    Activity Tolerance Patient tolerated treatment well    Behavior During Therapy [facility] for tasks assessed/performed             Past Medical History:  Diagnosis Date   Acid reflux    Anxiety    Arthritis    Asthma    as a child   Benign brain tumor ([place])    Cancer ([place])    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, squamous behind ear   Complication of anesthesia    slow to wake up and pain medicines make him sick   GERD (gastroesophageal reflux disease)    Hypercholesteremia    Hypertension    PONV (postoperative nausea and vomiting)    Sleep apnea    no Cpap   Subdural hematoma, 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   fell off truck hit head on concrete    Past Surgical History:  Procedure Laterality Date   APPLICATION OF CRANIAL NAVIGATION Left 04/29/2020   Procedure: [place];  Surgeon: Judith Part, MD;  Location: [place];  Service: Neurosurgery;  Laterality: Left;   arthroscopic knee     COLONOSCOPY N/A 11/29/2018   Procedure: COLONOSCOPY;  Surgeon: Rogene Houston, MD;  [facility];  Service: Endoscopy;  Laterality: N/A;  730-rescheduled 9/2 same time per Ann   CRANIOTOMY Left 04/29/2020   Procedure: LEFT CRANIECTOMY WITH TUMOR EXCISION;  Surgeon: Judith Part, MD;  Location: [place];  Service: Neurosurgery;  Laterality: Left;   KNEE SURGERY Right    NASAL  SEPTOPLASTY W/ TURBINOPLASTY Bilateral 03/19/2020   Procedure: NASAL SEPTOPLASTY WITH BILATERAL  TURBINATE REDUCTION;  Surgeon: Leta Baptist, MD;  [facility];  Service: ENT;  Laterality: Bilateral;   VASECTOMY      There were no vitals filed for this visit.   Subjective Assessment - 10/10/20 1122     Subjective Spouse notes some movement in right foot lately                [facility] PT Assessment - 10/10/20 0001       Assessment   Medical Diagnosis meningioma with tetraplegia                           [facility] Adult PT Treatment/Exercise - 10/10/20 0001       Transfers   Transfers Sit to Stand;Stand to Sit;Stand Pivot Transfers    Sit to Stand 3: Mod assist;2: Max assist    Stand to Sit 3: Mod assist;2: Max Social research officer, government Transfers 2: Max Doctor, general practice Details (indicate cue type and reason) therapist stabilizing right knee into extension and providing tactile cues through trunk for weight shifting and LLE advancement with good  return dmeonstration and able to perform weight shift with assistance      Neuro Re-ed    Neuro Re-ed Details  techniuqes to facilitate selective control RLE with use of visual tactile cues for patterned hip/knee/ankle flexion for clearance. Assisted techniques for selective right knee extension with visual/tactile guide 3x10 reps. Use of massage gun and visual guidance to RLE for stimulation and pt attempting volitional movement with approx 10% success of trials. Actiities to promote trunk flexion over BOS to improve trunk strength and dynamic balance for EOB activities. Techniuqes to improve rapid, alternating movements                     PT Education - 10/10/20 1222     Education Details education in use of lumbar support roll to use in power chair to improve posture and decrease posterior pelvic tilt    Person(s) Educated Patient;Spouse    Methods Explanation;Demonstration    Comprehension  Verbalized understanding;Returned demonstration              PT Short Term Goals - 10/02/20 1452       PT SHORT TERM GOAL #1   Title Pt I in HEP to improve LE strength to increase I in mobility    Time 2    Period Weeks    Status New    Target Date 10/16/20      PT SHORT TERM GOAL #2   Title Pt to be able to come sit to stand with min assist    Time 2    Period Weeks    Status New               PT Long Term Goals - 10/02/20 1454       PT LONG TERM GOAL #1   Title Pt to be able to come sit to stand with mod I    Time 4    Period Weeks    Status New    Target Date 10/30/20      PT LONG TERM GOAL #2   Title PT to be able to transfer with min assist    Time 4    Period Weeks    Status New      PT LONG TERM GOAL #3   Title PT to be able to complete bed mobility with min to mod  assist    Time 4    Period [place] - 10/10/20 1222     Clinical Impression Statement Progressing with motor control activities and demonstrating improved RLE awareness and spontaneous use noted. Able to achieve mobility with external support and demonstrate ability to participate in stand-pivot transfers with improved ability to weight shift and perform LLE advancement without assistance. Ctonitinued sessions indicated to progress functional mobility and reduce level of assistance from caregivers    Personal Factors and Comorbidities Comorbidity 3+;Fitness;Time since onset of injury/illness/exacerbation    Comorbidities OA, CA, HTN anxiety    Examination-Activity Limitations Bathing;Bed Mobility;Bend;Caring for Others;Carry;Dressing;Hygiene/Grooming;Lift;Continence;Locomotion Level;Reach Overhead;Self Feeding;Sit;Squat;Stairs;Stand;Toileting;Transfers    Examination-Participation Restrictions Church;Cleaning;Community Activity;Driving;Interpersonal Relationship;Laundry;Medication Management;Meal Prep;Occupation;Personal  Finances;School;Shop;Volunteer;Yard Work    Stability/Clinical Decision Making Stable/Uncomplicated    Rehab Potential Good    PT Frequency 2x / week    PT Duration 4 weeks    PT Treatment/Interventions Functional mobility training;Therapeutic activities;Therapeutic exercise;Gait training;Balance training;Passive range of motion;Patient/family education  PT Next Visit Plan Continue to work on sit to stand, standing tolerance and strengthening , progress to transfers and bed mobility as able    PT Home Exercise Plan eval:  PROM To RT; Lt ankle pumps, LAQ, hip marching, hip ab/adduction and glut sets             Patient will benefit from skilled therapeutic intervention in order to improve the following deficits and impairments:  Decreased strength, Decreased mobility, Decreased balance, Difficulty walking  Visit Diagnosis: Other lack of coordination  Other symptoms and signs involving the musculoskeletal system  Meningioma (HCC)  Muscle weakness (generalized)     Problem List Patient Active Problem List   Diagnosis Date Noted   Seizure ([place]) 08/13/2020   Seizures ([place]) 08/12/2020   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   DVT (deep venous thrombosis) ([place]) 08/12/2020   COVID-19 virus infection 08/12/2020   Acute lower UTI 07/14/2020   Delirium 07/06/2020   Sleep apnea 07/06/2020   GERD (gastroesophageal reflux disease) 07/06/2020   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[place]) 06/25/2020   Sundowning 06/25/2020   Slow transit constipation    Essential hypertension    Hypokalemia    Postoperative pain    Meningioma ([place]) 05/02/2020   Brain tumor ([place]) 04/28/2020   S/P nasal septoplasty 03/19/2020   Special screening for malignant neoplasms, colon 01/09/2018    Toniann Fail, PT 10/10/2020, 12:26 PM  [facility] [address] [place], [place], [address] Phone: [phone]   Fax:  [phone]  Name: Anthony Downs MRN: [id number] Date of  Birth: [birth year]

## 2020-10-14 ENCOUNTER — Encounter (HOSPITAL_COMMUNITY): Payer: Self-pay | Admitting: Occupational Therapy

## 2020-10-14 ENCOUNTER — Other Ambulatory Visit: Payer: Self-pay

## 2020-10-14 ENCOUNTER — Ambulatory Visit (HOSPITAL_COMMUNITY): Payer: Medicare Other | Admitting: Occupational Therapy

## 2020-10-14 DIAGNOSIS — R29898 Other symptoms and signs involving the musculoskeletal system: Secondary | ICD-10-CM

## 2020-10-14 DIAGNOSIS — R278 Other lack of coordination: Secondary | ICD-10-CM

## 2020-10-14 NOTE — Therapy (Signed)
[place], [place], [address] Phone: [phone]   Fax:  [phone]  Occupational Therapy Treatment  Patient Details  Name: Anthony Downs MRN: [id number] Date of Birth: 1950/03/23 Referring Provider (OT): Dr. Alger Simons   Encounter Date: 10/14/2020   OT End of Session - 10/14/20 1651     Visit Number 6    Number of Visits 8    Date for OT Re-Evaluation 10/25/20    Authorization Type 1) Medicare 2) UHC    Authorization Time Period 30 visit limit    Authorization - Visit Number 6    Authorization - Number of Visits 30    Progress Note Due on Visit 10    OT Start Time 1519    OT Stop Time 1557    OT Time Calculation (min) 38 min    Activity Tolerance Patient tolerated treatment well    Behavior During Therapy WFL for tasks assessed/performed             Past Medical History:  Diagnosis Date   Acid reflux    Anxiety    Arthritis    Asthma    as a child   Benign brain tumor ([place])    Cancer ([place])    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, squamous behind ear   Complication of anesthesia    slow to wake up and pain medicines make him sick   GERD (gastroesophageal reflux disease)    Hypercholesteremia    Hypertension    PONV (postoperative nausea and vomiting)    Sleep apnea    no Cpap   Subdural hematoma, 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   fell off truck hit head on concrete    Past Surgical History:  Procedure Laterality Date   APPLICATION OF CRANIAL NAVIGATION Left 04/29/2020   Procedure: [place];  Surgeon: Judith Part, MD;  Location: [place];  Service: Neurosurgery;  Laterality: Left;   arthroscopic knee     COLONOSCOPY N/A 11/29/2018   Procedure: COLONOSCOPY;  Surgeon: Rogene Houston, MD;  [facility];  Service: Endoscopy;  Laterality: N/A;  730-rescheduled 9/2 same time per Ann   CRANIOTOMY Left 04/29/2020   Procedure: LEFT CRANIECTOMY WITH  TUMOR EXCISION;  Surgeon: Judith Part, MD;  Location: [place];  Service: Neurosurgery;  Laterality: Left;   KNEE SURGERY Right    NASAL SEPTOPLASTY W/ TURBINOPLASTY Bilateral 03/19/2020   Procedure: NASAL SEPTOPLASTY WITH BILATERAL  TURBINATE REDUCTION;  Surgeon: Leta Baptist, MD;  [facility];  Service: ENT;  Laterality: Bilateral;   VASECTOMY      There were no vitals filed for this visit.   Subjective Assessment - 10/14/20 1648     Subjective  S: It's actually kind of hard.    Patient is accompanied by: Family member   wife   Currently in Pain? No/denies                [facility] OT Assessment - 10/14/20 1648       Assessment   Medical Diagnosis meningioma with tetraplegia      Precautions   Precautions Fall                      OT Treatments/Exercises (OP) - 10/14/20 1648       Neurological Re-education Exercises   Shoulder Flexion PROM;5 reps;AAROM;10 reps    Shoulder ABduction PROM;5 reps    Shoulder Protraction AAROM;10  reps    Shoulder External Rotation PROM;5 reps;AROM;10 reps    Shoulder Internal Rotation PROM;5 reps;AROM;10 reps    Elbow Flexion PROM;5 reps;AROM;10 reps    Elbow Extension PROM;5 reps;AROM;10 reps    Forearm Supination PROM;5 reps;AROM;10 reps    Forearm Pronation PROM;5 reps;AROM;10 reps    Wrist Flexion PROM;5 reps    Wrist Extension PROM;5 reps    Development of Reach Reaching    Reaching to Waist Pt completing functional reaching task, placing clothespins along middle horizontal and vertical bars of pinch tree. Pt able to place 3 yellow clothespins along vertical bar and remaining yellow and all red clothespins along middle horizontal bar. Pt occasionally using left hand to assist as RUE became fatigued.                    OT Education - 10/14/20 1541     Education Details AA/ROM protraction and flexion; A/ROM-elbow flexion/extension, forearm supination/pronation, er/IR, wrist flexion/extension    Person(s)  Educated Patient;Spouse    Methods Explanation;Demonstration;Handout    Comprehension Verbalized understanding              OT Short Term Goals - 09/29/20 1241       OT SHORT TERM GOAL #1   Title Pt will be provided with and educated on HEP to improve mobility of RUE required for incorporating into ADL tasks.    Time 4    Period Weeks    Status On-going    Target Date 10/25/20      OT SHORT TERM GOAL #2   Title Pt will increase functional use of RUE by 25% to improve ability to incorporate RUE into ADL tasks such as dressing and bathing.    Time 4    Period Weeks    Status On-going      OT SHORT TERM GOAL #3   Title Pt will be educated on adaptive strategies and techniques to improve independence in ADL completion.    Time 4    Period Weeks    Status On-going      OT SHORT TERM GOAL #4   Title Pt will be educated on weightbearing techniques to facilitate NMR and decrease pain in RUE.    Time 4    Period Weeks    Status On-going      OT SHORT TERM GOAL #5   Title Pt will be educated on available AE and DME to assist with ADL completion as needed for improved independence.    Time 4    Period Weeks    Status On-going                      Plan - 10/14/20 1652     Clinical Impression Statement A: Pt had a great session, continues to improve strength and functional use of RUE. Pt able to complete AA/ROM and A/ROM today, also completing functional reaching task with pinch tree. Pt provided with rest breaks for fatigue, exercises completed in increments of 5, 2 rounds completed to get 10 reps. Wife reports he has been completing some AA/ROM while lying in bed, updated HEP for AA/ROM and A/ROM and encouraged pt to complete daily.    Body Structure / Function / Physical Skills ADL;Endurance;UE functional use;Fascial restriction;Pain;ROM;Sensation;IADL;Skin integrity;Strength;Edema;Tone    Plan P: Follow up on HEP completion, continue with AA/ROM, functional  reaching task using saebo ring tree, add wrist exercises    OT Home Exercise Plan eval: self-ROM for  protraction, elbow flexion/extension, and wrist flexion/extension; provided medium edema glove for right hand; 9/12: table slides; 9/27: AA/ROM shoulder protraction and flexion, A/ROM elbow flex/ext, forearm sup/pron, wrist flexion/ext    Consulted and Agree with Plan of Care Patient;Family member/caregiver    Family Member Consulted Wife             Patient will benefit from skilled therapeutic intervention in order to improve the following deficits and impairments:   Body Structure / Function / Physical Skills: ADL, Endurance, UE functional use, Fascial restriction, Pain, ROM, Sensation, IADL, Skin integrity, Strength, Edema, Tone       Visit Diagnosis: Other lack of coordination  Other symptoms and signs involving the musculoskeletal system    Problem List Patient Active Problem List   Diagnosis Date Noted   Seizure ([place]) 08/13/2020   Seizures ([place]) 08/12/2020   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   DVT (deep venous thrombosis) ([place]) 08/12/2020   COVID-19 virus infection 08/12/2020   Acute lower UTI 07/14/2020   Delirium 07/06/2020   Sleep apnea 07/06/2020   GERD (gastroesophageal reflux disease) 07/06/2020   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[place]) 06/25/2020   Sundowning 06/25/2020   Slow transit constipation    Essential hypertension    Hypokalemia    Postoperative pain    Meningioma ([place]) 05/02/2020   Brain tumor ([place]) 04/28/2020   S/P nasal septoplasty 03/19/2020   Special screening for malignant neoplasms, colon 01/09/2018    Guadelupe Sabin, OTR/L  [phone] 10/14/2020, 4:55 PM  [place], [place], [address] Phone: [phone]   Fax:  [phone]  Name: Anthony Downs MRN: [id number] Date of Birth: [birth year]

## 2020-10-14 NOTE — Patient Instructions (Signed)
Perform each exercise ____10____ reps. 2-3x days.   1) Protraction   Start by holding a wand or cane at chest height.  Next, slowly push the wand outwards in front of your body so that your elbows become fully straightened. Then, return to the original position.     2) Shoulder FLEXION   In the standing position, hold a wand/cane with both arms, palms down on both sides. Raise up the wand/cane allowing your unaffected arm to perform most of the effort. Your affected arm should be partially relaxed.        3) Internal & External Rotation  Standing:   Stand with elbows at the side and elbows bent 90 degrees. Move your forearms away from your body, then bring back inward toward the body.    AROM Exercises   4) Wrist Flexion  Start with wrist at edge of table, palm facing up. With wrist hanging slightly off table, curl wrist upward, and back down.      5) Wrist Extension  Start with wrist at edge of table, palm facing down. With wrist slightly off the edge of the table, curl wrist up and back down.       6) Elbow flexion and extension Bend your elbow upwards as shown and then lower to a straighten position.     7) Forearm supination and pronation Hold elbow at a right angle stabilizing on a table or armrest. Keep elbow at side and turn palm up and down.

## 2020-10-16 ENCOUNTER — Other Ambulatory Visit: Payer: Self-pay

## 2020-10-16 ENCOUNTER — Ambulatory Visit (HOSPITAL_COMMUNITY): Payer: Medicare Other | Admitting: Occupational Therapy

## 2020-10-16 ENCOUNTER — Encounter (HOSPITAL_COMMUNITY): Payer: Medicare Other | Admitting: Physical Therapy

## 2020-10-16 ENCOUNTER — Encounter (HOSPITAL_COMMUNITY): Payer: Self-pay | Admitting: Occupational Therapy

## 2020-10-16 DIAGNOSIS — R278 Other lack of coordination: Secondary | ICD-10-CM

## 2020-10-16 DIAGNOSIS — D329 Benign neoplasm of meninges, unspecified: Secondary | ICD-10-CM

## 2020-10-16 DIAGNOSIS — R29898 Other symptoms and signs involving the musculoskeletal system: Secondary | ICD-10-CM | POA: Diagnosis not present

## 2020-10-16 NOTE — Therapy (Signed)
[place], [place], [address] Phone: [phone]   Fax:  [phone]  Occupational Therapy Treatment  Patient Details  Name: Anthony Downs MRN: [id number] Date of Birth: 1950-03-12 Referring Provider (OT): Dr. Alger Simons   Encounter Date: 10/16/2020   OT End of Session - 10/16/20 1425     Visit Number 7    Number of Visits 8    Date for OT Re-Evaluation 10/25/20    Authorization Type 1) Medicare 2) UHC    Authorization Time Period 30 visit limit    Authorization - Visit Number 7    Authorization - Number of Visits 30    Progress Note Due on Visit 10    OT Start Time 1300    OT Stop Time 1340    OT Time Calculation (min) 40 min    Activity Tolerance Patient tolerated treatment well    Behavior During Therapy WFL for tasks assessed/performed             Past Medical History:  Diagnosis Date   Acid reflux    Anxiety    Arthritis    Asthma    as a child   Benign brain tumor ([place])    Cancer ([place])    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, squamous behind ear   Complication of anesthesia    slow to wake up and pain medicines make him sick   GERD (gastroesophageal reflux disease)    Hypercholesteremia    Hypertension    PONV (postoperative nausea and vomiting)    Sleep apnea    no Cpap   Subdural hematoma, 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   fell off truck hit head on concrete    Past Surgical History:  Procedure Laterality Date   APPLICATION OF CRANIAL NAVIGATION Left 04/29/2020   Procedure: [place];  Surgeon: Judith Part, MD;  Location: [place];  Service: Neurosurgery;  Laterality: Left;   arthroscopic knee     COLONOSCOPY N/A 11/29/2018   Procedure: COLONOSCOPY;  Surgeon: Rogene Houston, MD;  [facility];  Service: Endoscopy;  Laterality: N/A;  730-rescheduled 9/2 same time per Ann   CRANIOTOMY Left 04/29/2020   Procedure: LEFT CRANIECTOMY WITH  TUMOR EXCISION;  Surgeon: Judith Part, MD;  Location: [place];  Service: Neurosurgery;  Laterality: Left;   KNEE SURGERY Right    NASAL SEPTOPLASTY W/ TURBINOPLASTY Bilateral 03/19/2020   Procedure: NASAL SEPTOPLASTY WITH BILATERAL  TURBINATE REDUCTION;  Surgeon: Leta Baptist, MD;  [facility];  Service: ENT;  Laterality: Bilateral;   VASECTOMY      There were no vitals filed for this visit.   Subjective Assessment - 10/16/20 1257     Subjective  S: It's hard to reach out.    Patient is accompanied by: Family member   wife   Currently in Pain? No/denies                [facility] OT Assessment - 10/16/20 1256       Assessment   Medical Diagnosis meningioma with tetraplegia      Precautions   Precautions Fall                      OT Treatments/Exercises (OP) - 10/16/20 1400       Neurological Re-education Exercises   Shoulder Flexion PROM;5 reps;AAROM;10 reps    Shoulder ABduction PROM;5 reps    Shoulder Protraction AAROM;10  reps    Shoulder External Rotation PROM;5 reps;AROM;10 reps    Shoulder Internal Rotation PROM;5 reps;AROM;10 reps    Elbow Flexion PROM;5 reps;AROM;10 reps    Elbow Extension PROM;5 reps;AROM;10 reps    Finger Flexion A/ROM 10X    Finger Extension A/ROM 10X    Other Grasp and Release Exercises  Pt working on grasping pegs to place into pegboard, grasping with lateral pinch.    Weight Bearing Position Seated    Seated with weight on forearm Pt seated parallel to table in ADL kitchen, forearm positioned on towel and pt sitting tall to weightbear on forearm and move 10 rings from table to saebo tree using left hand. Pt then moved the rings from the bottom branch to the middle branch.    Development of Reach Reaching    Reaching to Waist Pt reaching for pegs in various planes, then reaching forward into protraction to place into pegboard. Completed 10 pegs    Reaching to Shoulder Height Pt completing functional reaching task, reaching  for rings and saebo balls and placing on low and middle bars of ring tree. Pt then reaching for rings and placing onto vertical bar. OT holding rings and balls in various planes for active reach.                      OT Short Term Goals - 09/29/20 1241       OT SHORT TERM GOAL #1   Title Pt will be provided with and educated on HEP to improve mobility of RUE required for incorporating into ADL tasks.    Time 4    Period Weeks    Status On-going    Target Date 10/25/20      OT SHORT TERM GOAL #2   Title Pt will increase functional use of RUE by 25% to improve ability to incorporate RUE into ADL tasks such as dressing and bathing.    Time 4    Period Weeks    Status On-going      OT SHORT TERM GOAL #3   Title Pt will be educated on adaptive strategies and techniques to improve independence in ADL completion.    Time 4    Period Weeks    Status On-going      OT SHORT TERM GOAL #4   Title Pt will be educated on weightbearing techniques to facilitate NMR and decrease pain in RUE.    Time 4    Period Weeks    Status On-going      OT SHORT TERM GOAL #5   Title Pt will be educated on available AE and DME to assist with ADL completion as needed for improved independence.    Time 4    Period Weeks    Status On-going                      Plan - 10/16/20 1426     Clinical Impression Statement A: Pt continues to improve with strength and functional use of RUE. Pt working on AA/ROM and A/ROM of RUE today, added functional reaching tasks for development of grasp and reach. Also working on Art gallery manager with pegboard. Pt with mod fatigue at end of session, occasional short rest breaks provided as needed during session.    Body Structure / Function / Physical Skills ADL;Endurance;UE functional use;Fascial restriction;Pain;ROM;Sensation;IADL;Skin integrity;Strength;Edema;Tone    Plan P: Reassessment & recertification, add wrist exercises and continue  with object  manipulation    OT Home Exercise Plan eval: self-ROM for protraction, elbow flexion/extension, and wrist flexion/extension; provided medium edema glove for right hand; 9/12: table slides; 9/27: AA/ROM shoulder protraction and flexion, A/ROM elbow flex/ext, forearm sup/pron, wrist flexion/ext    Consulted and Agree with Plan of Care Patient;Family member/caregiver    Family Member Consulted Wife             Patient will benefit from skilled therapeutic intervention in order to improve the following deficits and impairments:   Body Structure / Function / Physical Skills: ADL, Endurance, UE functional use, Fascial restriction, Pain, ROM, Sensation, IADL, Skin integrity, Strength, Edema, Tone       Visit Diagnosis: Other lack of coordination  Other symptoms and signs involving the musculoskeletal system  Meningioma [facility])    Problem List Patient Active Problem List   Diagnosis Date Noted   Seizure ([place]) 08/13/2020   Seizures ([place]) 08/12/2020   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   DVT (deep venous thrombosis) ([place]) 08/12/2020   COVID-19 virus infection 08/12/2020   Acute lower UTI 07/14/2020   Delirium 07/06/2020   Sleep apnea 07/06/2020   GERD (gastroesophageal reflux disease) 07/06/2020   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[place]) 06/25/2020   Sundowning 06/25/2020   Slow transit constipation    Essential hypertension    Hypokalemia    Postoperative pain    Meningioma ([place]) 05/02/2020   Brain tumor ([place]) 04/28/2020   S/P nasal septoplasty 03/19/2020   Special screening for malignant neoplasms, colon 01/09/2018    Guadelupe Sabin, OTR/L  [phone] 10/16/2020, 2:31 PM  [place], [place], [address] Phone: [phone]   Fax:  [phone]  Name: DOVBER ERNEST MRN: [id number] Date of Birth: 11/19/1950

## 2020-10-20 ENCOUNTER — Ambulatory Visit (HOSPITAL_COMMUNITY): Payer: Medicare Other | Attending: Physical Medicine & Rehabilitation

## 2020-10-20 ENCOUNTER — Other Ambulatory Visit: Payer: Self-pay

## 2020-10-20 ENCOUNTER — Encounter (HOSPITAL_COMMUNITY): Payer: Self-pay

## 2020-10-20 DIAGNOSIS — R29898 Other symptoms and signs involving the musculoskeletal system: Secondary | ICD-10-CM | POA: Diagnosis present

## 2020-10-20 DIAGNOSIS — D329 Benign neoplasm of meninges, unspecified: Secondary | ICD-10-CM | POA: Diagnosis present

## 2020-10-20 DIAGNOSIS — R278 Other lack of coordination: Secondary | ICD-10-CM | POA: Insufficient documentation

## 2020-10-20 DIAGNOSIS — M6281 Muscle weakness (generalized): Secondary | ICD-10-CM | POA: Diagnosis present
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
## 2020-10-21 ENCOUNTER — Encounter (HOSPITAL_COMMUNITY): Payer: Medicare Other | Admitting: Physical Therapy

## 2020-10-21 ENCOUNTER — Ambulatory Visit (HOSPITAL_COMMUNITY): Payer: Medicare Other | Admitting: Occupational Therapy

## 2020-10-21 ENCOUNTER — Encounter (HOSPITAL_COMMUNITY): Payer: Self-pay | Admitting: Occupational Therapy

## 2020-10-21 DIAGNOSIS — R29898 Other symptoms and signs involving the musculoskeletal system: Secondary | ICD-10-CM

## 2020-10-21 DIAGNOSIS — R278 Other lack of coordination: Secondary | ICD-10-CM | POA: Diagnosis not present

## 2020-10-22 ENCOUNTER — Other Ambulatory Visit: Payer: Self-pay

## 2020-10-22 ENCOUNTER — Encounter (HOSPITAL_COMMUNITY): Payer: Self-pay | Admitting: Physical Therapy

## 2020-10-22 ENCOUNTER — Ambulatory Visit (HOSPITAL_COMMUNITY): Payer: Medicare Other | Admitting: Physical Therapy

## 2020-10-22 DIAGNOSIS — R278 Other lack of coordination: Secondary | ICD-10-CM

## 2020-10-22 DIAGNOSIS — R29898 Other symptoms and signs involving the musculoskeletal system: Secondary | ICD-10-CM

## 2020-10-22 DIAGNOSIS — M6281 Muscle weakness (generalized): Secondary | ICD-10-CM
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
## 2020-10-23 ENCOUNTER — Encounter (HOSPITAL_COMMUNITY): Payer: Medicare Other | Admitting: Physical Therapy

## 2020-10-23 ENCOUNTER — Encounter (HOSPITAL_COMMUNITY): Payer: Medicare Other | Admitting: Occupational Therapy

## 2020-10-27 ENCOUNTER — Ambulatory Visit (HOSPITAL_COMMUNITY): Payer: Medicare Other

## 2020-10-27 ENCOUNTER — Other Ambulatory Visit: Payer: Self-pay

## 2020-10-27 DIAGNOSIS — R278 Other lack of coordination: Secondary | ICD-10-CM

## 2020-10-27 DIAGNOSIS — D329 Benign neoplasm of meninges, unspecified: Secondary | ICD-10-CM

## 2020-10-27 DIAGNOSIS — M6281 Muscle weakness (generalized): Secondary | ICD-10-CM

## 2020-10-27 DIAGNOSIS — R29898 Other symptoms and signs involving the musculoskeletal system: Secondary | ICD-10-CM
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
## 2020-10-28 ENCOUNTER — Encounter (HOSPITAL_COMMUNITY): Payer: Medicare Other

## 2020-10-29 ENCOUNTER — Encounter (HOSPITAL_COMMUNITY): Payer: Self-pay | Admitting: Occupational Therapy

## 2020-10-29 ENCOUNTER — Other Ambulatory Visit: Payer: Self-pay

## 2020-10-29 ENCOUNTER — Ambulatory Visit (HOSPITAL_COMMUNITY): Payer: Medicare Other | Admitting: Occupational Therapy

## 2020-10-29 DIAGNOSIS — R278 Other lack of coordination: Secondary | ICD-10-CM | POA: Diagnosis not present

## 2020-10-29 DIAGNOSIS — R29898 Other symptoms and signs involving the musculoskeletal system: Secondary | ICD-10-CM
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
## 2020-10-30 ENCOUNTER — Encounter (HOSPITAL_COMMUNITY): Payer: Self-pay | Admitting: Occupational Therapy

## 2020-10-30 ENCOUNTER — Ambulatory Visit (HOSPITAL_COMMUNITY): Payer: Medicare Other | Admitting: Occupational Therapy

## 2020-10-30 DIAGNOSIS — R278 Other lack of coordination: Secondary | ICD-10-CM

## 2020-10-30 DIAGNOSIS — R29898 Other symptoms and signs involving the musculoskeletal system: Secondary | ICD-10-CM
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
## 2020-10-31 ENCOUNTER — Ambulatory Visit (HOSPITAL_COMMUNITY): Payer: Medicare Other

## 2020-10-31 ENCOUNTER — Other Ambulatory Visit: Payer: Self-pay

## 2020-10-31 DIAGNOSIS — R29898 Other symptoms and signs involving the musculoskeletal system: Secondary | ICD-10-CM

## 2020-10-31 DIAGNOSIS — D329 Benign neoplasm of meninges, unspecified: Secondary | ICD-10-CM

## 2020-10-31 DIAGNOSIS — R278 Other lack of coordination: Secondary | ICD-10-CM

## 2020-10-31 DIAGNOSIS — M6281 Muscle weakness (generalized): Secondary | ICD-10-CM
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
## 2020-11-03 ENCOUNTER — Encounter: Payer: Self-pay | Admitting: Urology

## 2020-11-03 ENCOUNTER — Other Ambulatory Visit: Payer: Self-pay

## 2020-11-03 ENCOUNTER — Ambulatory Visit (INDEPENDENT_AMBULATORY_CARE_PROVIDER_SITE_OTHER): Payer: Medicare Other | Admitting: Urology

## 2020-11-03 VITALS — BP 137/86 | HR 60 | Temp 98.1°F

## 2020-11-03 DIAGNOSIS — R350 Frequency of micturition: Secondary | ICD-10-CM

## 2020-11-03 LAB — URINALYSIS, ROUTINE W REFLEX MICROSCOPIC
Bilirubin, UA: NEGATIVE
Glucose, UA: NEGATIVE
Ketones, UA: NEGATIVE
Nitrite, UA: NEGATIVE
Protein,UA: NEGATIVE
Specific Gravity, UA: 1.015 (ref 1.005–1.030)
Urobilinogen, Ur: 0.2 mg/dL (ref 0.2–1.0)
pH, UA: 6.5 (ref 5.0–7.5)

## 2020-11-03 MED ORDER — FINASTERIDE 5 MG PO TABS: 5.0000 mg | ORAL_TABLET | Freq: Every day | ORAL | 3 refills | Status: DC
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
## 2020-11-03 NOTE — Progress Notes (Signed)
Urological Symptom Review  Patient is experiencing the following symptoms: Frequent urination Hard to postpone urination Stream starts and stops Urinary tract infection   Review of Systems  Gastrointestinal (upper)  : Indigestion/heartburn  Gastrointestinal (lower) : Constipation  Constitutional : Weight loss Fatigue  Skin: Itching  Eyes: Negative for eye symptoms  Ear/Nose/Throat : Negative for Ear/Nose/Throat symptoms  Hematologic/Lymphatic: Negative for Hematologic/Lymphatic symptoms  Cardiovascular : Negative for cardiovascular symptoms  Respiratory : Negative for respiratory symptoms  Endocrine: Negative for endocrine symptoms  Musculoskeletal: Joint pain  Neurological: Negative for neurological symptoms  Psychologic: Depression Anxiety

## 2020-11-03 NOTE — Progress Notes (Signed)
post void residual =246ml

## 2020-11-04 ENCOUNTER — Ambulatory Visit (HOSPITAL_COMMUNITY): Payer: Medicare Other

## 2020-11-04 DIAGNOSIS — D329 Benign neoplasm of meninges, unspecified: Secondary | ICD-10-CM

## 2020-11-04 DIAGNOSIS — M6281 Muscle weakness (generalized): Secondary | ICD-10-CM

## 2020-11-04 DIAGNOSIS — R29898 Other symptoms and signs involving the musculoskeletal system: Secondary | ICD-10-CM

## 2020-11-04 DIAGNOSIS — R278 Other lack of coordination: Secondary | ICD-10-CM | POA: Diagnosis not present

## 2020-11-04 NOTE — Telephone Encounter (Signed)
Patient added to my schedule 9/20 at [facility]
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
## 2020-11-05 ENCOUNTER — Other Ambulatory Visit: Payer: Self-pay

## 2020-11-05 ENCOUNTER — Encounter (HOSPITAL_COMMUNITY): Payer: Self-pay

## 2020-11-05 ENCOUNTER — Ambulatory Visit (HOSPITAL_COMMUNITY): Payer: Medicare Other

## 2020-11-05 DIAGNOSIS — R278 Other lack of coordination: Secondary | ICD-10-CM | POA: Diagnosis not present

## 2020-11-05 DIAGNOSIS — R29898 Other symptoms and signs involving the musculoskeletal system: Secondary | ICD-10-CM
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
## 2020-11-06 ENCOUNTER — Ambulatory Visit (INDEPENDENT_AMBULATORY_CARE_PROVIDER_SITE_OTHER): Payer: Medicare Other | Admitting: Neurology

## 2020-11-06 ENCOUNTER — Encounter: Payer: Self-pay | Admitting: Neurology

## 2020-11-06 VITALS — BP 124/81 | HR 79

## 2020-11-06 DIAGNOSIS — Z9889 Other specified postprocedural states: Secondary | ICD-10-CM | POA: Diagnosis not present

## 2020-11-06 DIAGNOSIS — Z86018 Personal history of other benign neoplasm: Secondary | ICD-10-CM

## 2020-11-06 DIAGNOSIS — R569 Unspecified convulsions: Secondary | ICD-10-CM | POA: Diagnosis not present

## 2020-11-06 DIAGNOSIS — F070 Personality change due to known physiological condition: Secondary | ICD-10-CM

## 2020-11-06 DIAGNOSIS — R5383 Other fatigue: Secondary | ICD-10-CM

## 2020-11-06 DIAGNOSIS — Z5181 Encounter for therapeutic drug level monitoring: Secondary | ICD-10-CM

## 2020-11-06 MED ORDER — DIVALPROEX SODIUM 500 MG PO DR TAB: 500.0000 mg | DELAYED_RELEASE_TABLET | Freq: Two times a day (BID) | ORAL | 6 refills | Status: DC

## 2020-11-06 MED ORDER — METHYLPHENIDATE HCL 5 MG PO TABS: 5.0000 mg | ORAL_TABLET | Freq: Every day | ORAL | 0 refills | Status: DC

## 2020-11-06 NOTE — Progress Notes (Signed)
[facility]  PATIENT: Anthony Downs DOB: [birth year]  REFERRING CLINICIAN: Redmond School, MD HISTORY FROM: Spouse Anthony Downs  REASON FOR VISIT: Seizure s/p meningioma resection    HISTORICAL  CHIEF COMPLAINT:  Chief Complaint  Patient presents with   Follow-up    Rm 13, with sister , c/o weakness after starting Depakote      INTERVAL HISTORY 11/06/2020 Anthony Downs presents today for follow-up, Anthony Downs presents today with his sister as his wife Anthony Downs has a doctor's appointment.  At last visit on August 15 plan was to discontinue the Keppra and switch him to Depakote.  Per wife Anthony Downs was very somnolent initially, his Depakote was checked and was 106 at that time advised her to decrease Depakote to 500 mg twice daily.  Anthony Downs was also started on Seroquel for delirium and titrated to 50 mg nightly.  Per wife who contacted me via MyChart, she expressed that Anthony Downs is still somnolent during the daytime, sometimes has difficulty performing physical therapy and she is worried that Anthony Downs will discharged from physical therapy.  Anthony Downs has not had any seizures since last visit.  His sleep is better but still required the Seroquel at night.    HISTORY OF PRESENT ILLNESS:  This is a 70 year old male with past medical history of essential hypertension, essential tremor, meningioma status post removal in April 2022 who is presenting with new onset seizure. In brief, wife reports that patient was normal prior to his meningioma resection, no memory deficits, no behavioral problems and no seizure.  Anthony Downs did have minimal right upper extremity and right lower extremity weakness as Anthony Downs was holding his right arm in close to his body and also dragging his right foot. Because of this, his PMD ordered a MRI brain which showed a meningioma left greater than right with surrounding vasogenic edema in the left posterior frontal lobe which was resected in April 2022.  Per chart review his neurosurgery note reports  "postop MRI showed a gross total resection except for some possible residual tumor in the patent portion of the sinus.  The tumor was very difficult to dissected free and appeared to be invasive.  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  His aphasia improved, strength slowly improved.  Anthony Downs was seen by PT/OT\SLP who recommended rehab placement".  Anthony Downs was in rehab for a total of Meningioma removed in April, rehab for 51 days then home. Wife reported since leaving rehab Anthony Downs is having delirium, sundowning syndrome.  Anthony Downs is on Seroquel 50 mg nightly and trazodone 100 mg nightly but Anthony Downs is not sleeping during the night. In the contrary, Anthony Downs sleeps during the day, per wife it is very difficult to keep him up during the day.   Wife reports on July 26 she heard the bridge the bed shake, when she went to check on him his whole body was shaking, head turned to the right, eyes rolled back.  She reported episode lasted more than 2 minutes because she was still shaking while she was on the phone with EMS.  After the event, per wife Anthony Downs look like Anthony Downs was passed out.  Anthony Downs was initially taken to [facility] but later transferred to [facility] where Anthony Downs had a EEG that showed left greater than right slowing but no seizures.  Anthony Downs was also found to have a UTI.  Anthony Downs was started on levetiracetam 500 mg twice daily and since  leaving the hospital wife denies any additional seizure or seizure-like event.  Since the seizure was reported memory is worse, with less on the right side is worse, and Anthony Downs is more irritable.  Sleep is worse as is not sleeping during the night.   Handedness: Right handed    Seizure Type: Generalized convulsion   Current frequency: only one   Any injuries from seizures: None  Seizure risk factors: Meningioma s/p resection   Previous ASMs: Levetiracetam    Currenty ASMs: Valproic Acid   ASMs side effects:  Memory, sleep, irritability with Levetiracetam, Sleep apnea   Brain Images 7/28:  No evidence of residual or recurrent meningioma tumor. Vertex craniectomy and cranioplasty for resection of meningioma. Segmental resection of the superior sagittal sinus. No residual or recurrent tumor seen in the region.  Previous EEGs: 7/27: This study is suggestive of cortical dysfunction arising from left frontocentral region suggestive of underlying structural abnormality. There is also mild diffuse encephalopathy, nonspecific etiology. No seizures or definite epileptiform discharges were seen throughout the recording.   OTHER MEDICAL CONDITIONS: HTN, Essential tremors   REVIEW OF SYSTEMS: Unable to fully obtain ROS due to patient mental status   ALLERGIES: Allergies  Allergen Reactions   Ativan [Lorazepam]     Worsening confusion   Haldol [Haloperidol]     Worsening confusion    HOME MEDICATIONS: Outpatient Medications Prior to Visit  Medication Sig Dispense Refill   atorvastatin (LIPITOR) 10 MG tablet Take 1 tablet (10 mg total) by mouth at bedtime. 30 tablet 0   diclofenac Sodium (VOLTAREN) 1 % GEL Apply 2 g topically 4 (four) times daily. 350 g 0   finasteride (PROSCAR) 5 MG tablet Take 1 tablet (5 mg total) by mouth daily. 90 tablet 3   furosemide (LASIX) 20 MG tablet Take 1 tablet (20 mg total) by mouth daily. 30 tablet 0   hydrOXYzine (ATARAX/VISTARIL) 25 MG tablet Take 1 tablet (25 mg total) by mouth 3 (three) times daily as needed for anxiety. 30 tablet 0   pantoprazole (PROTONIX) 40 MG tablet Take 1 tablet (40 mg total) by mouth daily. 30 tablet 0   potassium chloride SA (KLOR-CON) 20 MEQ tablet Take 1 tablet (20 mEq total) by mouth daily. 30 tablet 0   propranolol (INDERAL) 20 MG tablet Take 1 tablet (20 mg total) by mouth 3 (three) times daily. 90 tablet 0   Propylene Glycol-Glycerin (SOOTHE OP) Apply 1 drop to eye daily as needed (dry eyes).     QUEtiapine (SEROQUEL) 50 MG tablet Take  50 mg by mouth at bedtime.     rivaroxaban (XARELTO) 20 MG TABS tablet Take 1 tablet (20 mg total) by mouth daily with supper. 30 tablet 0   sennosides-docusate sodium (SENOKOT-S) 8.6-50 MG tablet Take 1 tablet by mouth daily.     tamsulosin (FLOMAX) 0.4 MG CAPS capsule TAKE 2 CAPSULES BY MOUTH AT BEDTIME. 60 capsule 2   thiamine 100 MG tablet Take 1 tablet (100 mg total) by mouth daily. 30 tablet 1   QUEtiapine (SEROQUEL) 50 MG tablet Take 2 tablets (100 mg total) by mouth at bedtime. (Patient not taking: Reported on 09/17/2020) 60 tablet 6   traZODone (DESYREL) 50 MG tablet Take 2 tablets (100 mg total) by mouth at bedtime. 60 tablet 0   divalproex (DEPAKOTE) 500 MG DR tablet Take 2 tablets (1,000 mg total) by mouth 2 (two) times daily. 120 tablet 6   levETIRAcetam (KEPPRA) 500 MG tablet Take by mouth.  No facility-administered medications prior to visit.    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, squamous behind ear   Complication of anesthesia    slow to wake up and pain medicines make him sick   GERD (gastroesophageal reflux disease)    Hypercholesteremia    Hypertension    PONV (postoperative nausea and vomiting)    Sleep apnea    no Cpap   Subdural hematoma, post-traumatic 2008   fell off truck hit head on concrete    PAST SURGICAL HISTORY: Past Surgical History:  Procedure Laterality Date   APPLICATION OF CRANIAL NAVIGATION Left 04/29/2020   Procedure: APPLICATION OF CRANIAL NAVIGATION;  Surgeon: Judith Part, MD;  Location: [place];  Service: Neurosurgery;  Laterality: Left;   arthroscopic knee     COLONOSCOPY N/A 11/29/2018   Procedure: COLONOSCOPY;  Surgeon: Rogene Houston, MD;  [facility];  Service: Endoscopy;  Laterality: N/A;  730-rescheduled 9/2 same time per Ann   CRANIOTOMY  Left 04/29/2020   Procedure: LEFT CRANIECTOMY WITH TUMOR EXCISION;  Surgeon: Judith Part, MD;  Location: [place];  Service: Neurosurgery;  Laterality: Left;   KNEE SURGERY Right    NASAL SEPTOPLASTY W/ TURBINOPLASTY Bilateral 03/19/2020   Procedure: NASAL SEPTOPLASTY WITH BILATERAL  TURBINATE REDUCTION;  Surgeon: Leta Baptist, MD;  Location: [facility];  Service: ENT;  Laterality: Bilateral;   VASECTOMY      FAMILY HISTORY: Family History  Problem Relation Age of Onset   Breast cancer Mother    Pancreatic cancer Mother    Aortic aneurysm Father     SOCIAL HISTORY: Social History   Socioeconomic History   Marital status: Married    Spouse name: Not on file   Number of children: 2   Years of education: 12   Highest education level: High school graduate  Occupational History   Occupation: Retired  Tobacco Use   Smoking status: Never   Smokeless tobacco: Never  Vaping Use   Vaping Use: Never used  Substance and Sexual Activity   Alcohol use: No    Comment: no use since 2008   Drug use: No   Sexual activity: Yes  Other Topics Concern   Not on file  Social History Narrative   Lives with wife.   Right-handed.   Two cans Dr. Malachi Bonds daily.   Social Determinants of Health   Financial Resource Strain: Not on file  Food Insecurity: Not on file  Transportation Needs: Not on file  Physical Activity: Not on file  Stress: Not on file  Social Connections: Not on file  Intimate Partner Violence: Not on file     PHYSICAL EXAM  GENERAL EXAM/CONSTITUTIONAL: Vitals:  Vitals:   11/06/20 1408  BP: 124/81  Pulse: 79    There is no height or weight on file to calculate BMI. Wt Readings from Last 3 Encounters:  08/12/20 262 lb 9.1 oz (119.1 kg)  07/07/20 260 lb 5.8 oz (118.1 kg)  06/25/20 264 lb (119.7 kg)   Patient is in no distress; well developed, nourished and groomed; neck is supple, sitting in his electronic wheelchair     EYES: Pupils round and reactive to light,  Visual fields full to confrontation, Extraocular movements intacts,  MUSCULOSKELETAL: Gait, strength, tone, movements noted in Neurologic exam below  NEUROLOGIC: MENTAL STATUS:  awake, alert, oriented to person, place, Anthony Downs is very interactive  normal attention and concentration language fluent, comprehension intact, naming intact fund of knowledge appropriate  CRANIAL NERVE:  2nd, 3rd, 4th, 6th - pupils equal and reactive to light, visual fields full to confrontation, extraocular muscles intact, no nystagmus 5th - facial sensation symmetric 7th - facial strength symmetric 8th - hearing intact 9th - palate elevates symmetrically, uvula midline 12th - tongue protrusion midline  MOTOR:  RUE tremor noted (has a history of essential tremors)  normal bulk and tone, full strength in the RU and LE extremity. Right upper extremity weakness noted 2/5 (there is also pain in the right shoulder and right wrist). RLE 0/5, only able to wiggle toes minimally.   SENSORY:  normal and symmetric to light touch  REFLEXES:  deep tendon reflexes present and symmetric  GAIT/STATION:  Not done, in a wheelchair.    DIAGNOSTIC DATA (LABS, IMAGING, TESTING) - I reviewed patient records, labs, notes, testing and imaging myself where available.  Lab Results  Component Value Date   WBC 6.9 08/13/2020   HGB 12.7 (L) 08/13/2020   HCT 39.4 08/13/2020   MCV 90.2 08/13/2020   PLT 239 08/13/2020      Component Value Date/Time   NA 142 11/06/2020 1451   K 4.4 11/06/2020 1451   CL 104 11/06/2020 1451   CO2 26 11/06/2020 1451   GLUCOSE 100 (H) 11/06/2020 1451   GLUCOSE 124 (H) 08/15/2020 0348   BUN 15 11/06/2020 1451   CREATININE 1.04 11/06/2020 1451   CALCIUM 9.2 11/06/2020 1451   PROT 6.4 11/06/2020 1451   ALBUMIN 4.0 11/06/2020 1451   AST 11 11/06/2020 1451   ALT 12 11/06/2020 1451   ALKPHOS 75 11/06/2020 1451   BILITOT 0.3 11/06/2020 1451   GFRNONAA >60 08/15/2020 0348   GFRAA 70 (L)  04/04/2011 2128   No results found for: CHOL, HDL, LDLCALC, LDLDIRECT, TRIG Lab Results  Component Value Date   HGBA1C 6.0 (H) 07/07/2020   Lab Results  Component Value Date   VEHMCNOB09 628 08/14/2020   Lab Results  Component Value Date   TSH 2.345 07/07/2020    MRI Brain 08/14/2020 No evidence of residual or recurrent meningioma tumor. Vertex craniectomy and cranioplasty for resection of meningioma. Segmental resection of the superior sagittal sinus. No residual or recurrent tumor seen in the region.  CT Venogram 04/28/2020 1. 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 A small portion of the mass does extend to the right of the falx and overlies the medial aspect of the posterior right frontal lobe. Unchanged mass effect upon the left frontal lobe with mild-to-moderate underlying vasogenic edema. Notably, there is mass effect upon the left motor cortex. 2. The parafalcine meningioma fills and expands portions of the superior sagittal sinus. However, anterior and posterior to this, the superior sagittal sinus appears patent. No evidence of intracranial venous thrombosis elsewhere. 3. Subtle remodeling of the inner table of the parietal calvarium adjacent to the mass. 4. Chronic cortically-based foci of encephalomalacia within the anteroinferior left frontal lobe and anterior left temporal lobe, likely posttraumatic in etiology   Routine EEG 08/13/2020 This study is suggestive of cortical dysfunction arising from left frontocentral region suggestive of underlying structural abnormality. There is also mild diffuse encephalopathy, nonspecific etiology. No seizures or definite  epileptiform discharges were seen throughout the recording.   I personally reviewed brain Images and previous EEG reports.   ASSESSMENT AND PLAN  70 y.o. year old male with past medical history of  parafalcine meningioma s/p resection, seizure disorder, essential tremor, hypertension, diabetes who is presenting for following.  On exam today, Anthony Downs appears more engaged and more interactive.  Anthony Downs is compliant with her Depakote 500 mg twice daily, denies any side effect from the medication.  Wife has noted that there are some daytime tiredness/sleepiness and sometimes Anthony Downs has difficulty completing physical therapy.  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  I will also check his Depakote level and CMP today and adjust as needed.  I will see him in 3 months for follow-up.    1. Seizures ([place])   2. Other fatigue   3. S/P resection of meningioma   4. Frontal lobe syndrome   5. Therapeutic drug monitoring      PLAN: Continue with Depakote 500 mg BID  Will obtain Depakote level today Consider decreasing Seroquel to 25 mg nightly  Continue with Propanolol 20 mg daily  Trial of Ritalin 5 mg before breakfast, can give a second dose before lunch as tolerated to promote wakefulness Return to clinic in 3 months    Per [facility] statutes, patients with seizures are not allowed to drive until they have been seizure-free for six months.  Other recommendations include using caution when using heavy equipment or power tools. Avoid working on ladders or at heights. Take showers instead of baths.  Do not swim alone.  Ensure the water temperature is not too high on the home water heater. Do not go swimming alone. Do not lock yourself in a room alone (i.e. bathroom). When caring for infants or small children, sit down when holding, feeding, or changing them to minimize risk of injury to the child in the event you have  a seizure. Maintain good sleep hygiene. Avoid alcohol.  Also recommend adequate sleep, hydration, good diet and minimize stress.   During the Seizure  - First, ensure adequate ventilation and place patients on the floor on their left side  Loosen clothing around the neck and ensure the airway is patent. If the patient is clenching the teeth, do not force the mouth open with any object as this can cause severe damage - Remove all items from the surrounding that can be hazardous. The patient may be oblivious to what's happening and may not even know what Anthony Downs or she is doing. If the patient is confused and wandering, either gently guide him/her away and block access to outside areas - Reassure the individual and be comforting - Call 911. In most cases, the seizure ends before EMS arrives. However, there are cases when seizures may last over 3 to 5 minutes. Or the individual may have developed breathing difficulties or severe injuries. If a pregnant patient or a person with diabetes develops a seizure, it is prudent to call an ambulance. - Finally, if the patient does not regain full consciousness, then call EMS. Most patients will remain confused for about 45 to 90 minutes after a seizure, so you must use judgment in calling for help. - Avoid  restraints but make sure the patient is in a bed with padded side rails - Place the individual in a lateral position with the neck slightly flexed; this will help the saliva drain from the mouth and prevent the tongue from falling backward - Remove all nearby furniture and other hazards from the area - Provide verbal assurance as the individual is regaining consciousness - Provide the patient with privacy if possible - Call for help and start treatment as ordered by the caregiver   After the Seizure (Postictal Stage)  After a seizure, most patients experience confusion, fatigue, muscle pain and/or a headache. Thus, one should permit the individual to sleep. For  the next few days, reassurance is essential. Being calm and helping reorient the person is also of importance.  Most seizures are painless and end spontaneously. Seizures are not harmful to others but can lead to complications such as stress on the lungs, brain and the heart. Individuals with prior lung problems may develop labored breathing and respiratory distress.     Orders Placed This Encounter  Procedures   Valproic Acid Level   CMP     Meds ordered this encounter  Medications   methylphenidate (RITALIN) 5 MG tablet    Sig: Take 1 tablet (5 mg total) by mouth daily before breakfast.    Dispense:  30 tablet    Refill:  0   methylphenidate (RITALIN) 5 MG tablet    Sig: Take 1 tablet (5 mg total) by mouth daily before breakfast.    Dispense:  30 tablet    Refill:  0   methylphenidate (RITALIN) 5 MG tablet    Sig: Take 1 tablet (5 mg total) by mouth daily before breakfast.    Dispense:  30 tablet    Refill:  0   divalproex (DEPAKOTE) 500 MG DR tablet    Sig: Take 1 tablet (500 mg total) by mouth 2 (two) times daily.    Dispense:  120 tablet    Refill:  6     Return in about 3 months (around 02/06/2021).    Alric Ran, MD 11/09/2020, 1:22 PM  [facility] [address] [place], [place] [address] [phone]

## 2020-11-06 NOTE — Patient Instructions (Signed)
Continue with Depakote 500 mg BID  Will obtain Depakote level today Consider decreasing Seroquel to 25 mg nightly  Continue with Propanolol 20 mg daily  Trial of Ritalin 5 mg before breakfast, can give a second dose before lunch as tolerated to promote wakefulness Follow up as scheduled.

## 2020-11-07 ENCOUNTER — Ambulatory Visit (HOSPITAL_COMMUNITY): Payer: Medicare Other

## 2020-11-07 ENCOUNTER — Telehealth: Payer: Self-pay

## 2020-11-07 LAB — COMPREHENSIVE METABOLIC PANEL
ALT: 12 IU/L (ref 0–44)
AST: 11 IU/L (ref 0–40)
Albumin/Globulin Ratio: 1.7 (ref 1.2–2.2)
Albumin: 4 g/dL (ref 3.7–4.7)
Alkaline Phosphatase: 75 IU/L (ref 44–121)
BUN/Creatinine Ratio: 14 (ref 10–24)
BUN: 15 mg/dL (ref 8–27)
Bilirubin Total: 0.3 mg/dL (ref 0.0–1.2)
CO2: 26 mmol/L (ref 20–29)
Calcium: 9.2 mg/dL (ref 8.6–10.2)
Chloride: 104 mmol/L (ref 96–106)
Creatinine, Ser: 1.04 mg/dL (ref 0.76–1.27)
Globulin, Total: 2.4 g/dL (ref 1.5–4.5)
Glucose: 100 mg/dL — ABNORMAL HIGH (ref 70–99)
Potassium: 4.4 mmol/L (ref 3.5–5.2)
Sodium: 142 mmol/L (ref 134–144)
Total Protein: 6.4 g/dL (ref 6.0–8.5)
eGFR: 77 mL/min/{1.73_m2} (ref >59)

## 2020-11-07 LAB — VALPROIC ACID LEVEL: Valproic Acid Lvl: 62 ug/mL (ref 50–100)

## 2020-11-07 NOTE — Telephone Encounter (Signed)
Pt's wife called and stated that he is having diaherra from the medication that was given for his prostate and want to know if there was a different medication he can try, please advise

## 2020-11-10 ENCOUNTER — Encounter (HOSPITAL_COMMUNITY): Payer: Self-pay

## 2020-11-10 ENCOUNTER — Other Ambulatory Visit: Payer: Self-pay

## 2020-11-10 ENCOUNTER — Ambulatory Visit (HOSPITAL_COMMUNITY): Payer: Medicare Other

## 2020-11-10 DIAGNOSIS — D329 Benign neoplasm of meninges, unspecified: Secondary | ICD-10-CM

## 2020-11-10 DIAGNOSIS — M6281 Muscle weakness (generalized): Secondary | ICD-10-CM

## 2020-11-10 DIAGNOSIS — R278 Other lack of coordination: Secondary | ICD-10-CM

## 2020-11-10 DIAGNOSIS — R29898 Other symptoms and signs involving the musculoskeletal system: Secondary | ICD-10-CM

## 2020-11-10 NOTE — Telephone Encounter (Signed)
Called and spoke with pt wife, advised her that the medication for psa will not cause diaherra follow up w/ pcp , pt she said for the 3 days that he took he had diaherra and once he stopped he stopped  have diaherra. She said that he hasn't diaherra since.
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
## 2020-11-12 ENCOUNTER — Ambulatory Visit (HOSPITAL_COMMUNITY): Payer: Medicare Other | Admitting: Physical Therapy

## 2020-11-12 ENCOUNTER — Ambulatory Visit (HOSPITAL_COMMUNITY): Payer: Medicare Other

## 2020-11-12 ENCOUNTER — Other Ambulatory Visit: Payer: Self-pay

## 2020-11-12 DIAGNOSIS — M6281 Muscle weakness (generalized): Secondary | ICD-10-CM

## 2020-11-12 DIAGNOSIS — D329 Benign neoplasm of meninges, unspecified: Secondary | ICD-10-CM

## 2020-11-12 DIAGNOSIS — R278 Other lack of coordination: Secondary | ICD-10-CM | POA: Diagnosis not present

## 2020-11-12 DIAGNOSIS — R29898 Other symptoms and signs involving the musculoskeletal system: Secondary | ICD-10-CM
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
## 2020-11-12 NOTE — Patient Instructions (Signed)
Access Code: [email or web address] URL: [email or web address] Date: 11/12/2020 Prepared by: Josue Hector  Exercises Seated Scapular Retraction - 3 x daily - 7 x weekly - 1-2 sets - 10 reps - 5 second hold

## 2020-11-14 ENCOUNTER — Ambulatory Visit (HOSPITAL_COMMUNITY): Payer: Medicare Other

## 2020-11-14 ENCOUNTER — Other Ambulatory Visit: Payer: Self-pay

## 2020-11-14 ENCOUNTER — Encounter (HOSPITAL_COMMUNITY): Payer: Self-pay

## 2020-11-14 DIAGNOSIS — R29898 Other symptoms and signs involving the musculoskeletal system: Secondary | ICD-10-CM

## 2020-11-14 DIAGNOSIS — R278 Other lack of coordination: Secondary | ICD-10-CM | POA: Diagnosis not present
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
## 2020-11-17 ENCOUNTER — Encounter (HOSPITAL_COMMUNITY): Payer: Medicare Other

## 2020-11-19 ENCOUNTER — Encounter (HOSPITAL_COMMUNITY): Payer: Medicare Other

## 2020-11-21 ENCOUNTER — Ambulatory Visit (HOSPITAL_COMMUNITY): Payer: Medicare Other | Attending: Physical Medicine & Rehabilitation

## 2020-11-21 ENCOUNTER — Encounter (HOSPITAL_COMMUNITY): Payer: Self-pay

## 2020-11-21 ENCOUNTER — Other Ambulatory Visit: Payer: Self-pay

## 2020-11-21 DIAGNOSIS — M6281 Muscle weakness (generalized): Secondary | ICD-10-CM | POA: Diagnosis present

## 2020-11-21 DIAGNOSIS — R278 Other lack of coordination: Secondary | ICD-10-CM | POA: Diagnosis present

## 2020-11-21 DIAGNOSIS — R29898 Other symptoms and signs involving the musculoskeletal system: Secondary | ICD-10-CM | POA: Insufficient documentation

## 2020-11-21 NOTE — Therapy (Signed)
[place], [place], [address] Phone: [phone]   Fax:  [phone]  Occupational Therapy Treatment  Patient Details  Name: Anthony Downs MRN: [id number] Date of Birth: May 16, 1950 Referring Provider (OT): Dr. Alger Simons   Encounter Date: 11/21/2020   OT End of Session - 11/21/20 0955     Visit Number 13    Number of Visits 16    Date for OT Re-Evaluation 11/28/20    Authorization Type 1) Medicare 2) UHC    Authorization Time Period 30 visit limit    Authorization - Visit Number 60    Authorization - Number of Visits 30    Progress Note Due on Visit 19    OT Start Time 0900    OT Stop Time 0943    OT Time Calculation (min) 43 min    Activity Tolerance Patient tolerated treatment well    Behavior During Therapy WFL for tasks assessed/performed             Past Medical History:  Diagnosis Date   Acid reflux    Anxiety    Arthritis    Asthma    as a child   Benign brain tumor ([place])    Cancer ([place])    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, squamous behind ear   Complication of anesthesia    slow to wake up and pain medicines make him sick   GERD (gastroesophageal reflux disease)    Hypercholesteremia    Hypertension    PONV (postoperative nausea and vomiting)    Sleep apnea    no Cpap   Subdural hematoma, post-traumatic 2008   fell off truck hit head on concrete    Past Surgical History:  Procedure Laterality Date   APPLICATION OF CRANIAL NAVIGATION Left 04/29/2020   Procedure: [place];  Surgeon: Judith Part, MD;  Location: [facility];  Service: Neurosurgery;  Laterality: Left;   arthroscopic knee     COLONOSCOPY N/A 11/29/2018   Procedure: COLONOSCOPY;  Surgeon: Rogene Houston, MD;  [facility];  Service: Endoscopy;  Laterality: N/A;  730-rescheduled 9/2 same time per Ann   CRANIOTOMY Left 04/29/2020   Procedure: LEFT CRANIECTOMY WITH TUMOR  EXCISION;  Surgeon: Judith Part, MD;  Location: [facility];  Service: Neurosurgery;  Laterality: Left;   KNEE SURGERY Right    NASAL SEPTOPLASTY W/ TURBINOPLASTY Bilateral 03/19/2020   Procedure: NASAL SEPTOPLASTY WITH BILATERAL  TURBINATE REDUCTION;  Surgeon: Leta Baptist, MD;  [facility];  Service: ENT;  Laterality: Bilateral;   VASECTOMY      There were no vitals filed for this visit.   Subjective Assessment - 11/21/20 0909     Subjective  Wife reports that she purchased the clothespins.    Patient is accompanied by: Family member   Wife   Currently in Pain? No/denies                [facility] OT Assessment - 11/21/20 0910       Assessment   Medical Diagnosis meningioma with tetraplegia      Precautions   Precautions Fall    Precaution Comments Patient uses powered wheelchair                      OT Treatments/Exercises (OP) - 11/21/20 0910       Exercises   Exercises Shoulder;Hand      Neurological Re-education Exercises  Shoulder Flexion AAROM;10 reps;Seated   OT unweighted arm   Elbow Extension PROM;5 reps    Forearm Supination PROM;5 reps    Forearm Pronation PROM;5 reps    Wrist Flexion PROM;5 reps    Wrist Extension PROM;5 reps    Finger Flexion P/ROM composite; 10X digits 2-5    Other Grasp and Release Exercises  5 jenga blocks used. 5 sponges placed on each one.Functional grasp and release continued while using jenga pieces stacked 2 together 3 long total. Worked on transferring sponges from left to right clearing the wall then forward and back and back to the front.    Other Grasp and Release Exercises  Able to place 5 soft sponges; 1 at a time followed by 6 jenga blocks into storage container at end of session.      Modalities   Modalities Moist Heat      Moist Heat Therapy   Number Minutes Moist Heat 5 Minutes    Moist Heat Location Hand                    OT Education - 11/21/20 1020     Education Details Provided  built up foam piece for toothbrush. Encouraged patient to use his right hand to stabilize the toothpaste when opening with his left hand and hold his toothbrush in his righ hand when applying the paste with his left.    Person(s) Educated Patient;Spouse    Methods Explanation    Comprehension Verbalized understanding              OT Short Term Goals - 11/05/20 1537       OT SHORT TERM GOAL #1   Title Pt will be provided with and educated on HEP to improve mobility of RUE required for incorporating into ADL tasks.    Time 4    Period Weeks    Status On-going    Target Date 10/25/20      OT SHORT TERM GOAL #2   Title Pt will increase functional use of RUE by 25% to improve ability to incorporate RUE into ADL tasks such as dressing and bathing.    Time 4    Period Weeks    Status Partially Met      OT SHORT TERM GOAL #3   Title Pt will be educated on adaptive strategies and techniques to improve independence in ADL completion.    Time 4    Period Weeks    Status On-going      OT SHORT TERM GOAL #4   Title Pt will be educated on weightbearing techniques to facilitate NMR and decrease pain in RUE.    Time 4    Period Weeks      OT SHORT TERM GOAL #5   Title Pt will be educated on available AE and DME to assist with ADL completion as needed for improved independence.    Time 4    Period Weeks    Status On-going      OT SHORT TERM GOAL #6   Title Pt will increase RUE strength to 3+/5 or greater to improve ability to reach to shoulder height or higher when performing functional reaching tasks.    Time 4    Period Weeks    Status On-going      OT SHORT TERM GOAL #7   Title Pt will increase right grip strength by 5# and pinch strength by 2# to improve ability to grasp and hold lightweight objects  such as a water bottle.    Time 4    Period Weeks    Status On-going                      Plan - 11/21/20 0956     Clinical Impression Statement A: Pt continues  to experience decreased joint mobility in the right UE with pain in the right hand joints with any passive stretching. Heat was applied briefly at start of session prior to passive stretching to help with mobility. Patient completes activities within a close proximity of his body although as sesson progressed he demonstrated more movement and functional reaching ability. VC for form and technique were provided.    Body Structure / Function / Physical Skills ADL;Endurance;UE functional use;Fascial restriction;Pain;ROM;Sensation;IADL;Skin integrity;Strength;Edema;Tone    Plan P: Completed reassessment next session due to upcoming follow up appointment with Dr. Naaman Plummer.    Consulted and Agree with Plan of Care Patient;Family member/caregiver    Family Member Consulted Wife             Patient will benefit from skilled therapeutic intervention in order to improve the following deficits and impairments:   Body Structure / Function / Physical Skills: ADL, Endurance, UE functional use, Fascial restriction, Pain, ROM, Sensation, IADL, Skin integrity, Strength, Edema, Tone       Visit Diagnosis: Other lack of coordination  Other symptoms and signs involving the musculoskeletal system    Problem List Patient Active Problem List   Diagnosis Date Noted   Seizure ([place]) 08/13/2020   Seizures ([place]) 08/12/2020   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   DVT (deep venous thrombosis) ([place]) 08/12/2020   COVID-19 virus infection 08/12/2020   Acute lower UTI 07/14/2020   Delirium 07/06/2020   Sleep apnea 07/06/2020   GERD (gastroesophageal reflux disease) 07/06/2020   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[place]) 06/25/2020   Sundowning 06/25/2020   Slow transit constipation    Essential hypertension    Hypokalemia    Postoperative pain    Meningioma ([place]) 05/02/2020   Brain tumor ([place]) 04/28/2020   S/P nasal septoplasty 03/19/2020   Special screening for malignant neoplasms, colon 01/09/2018   Ailene Ravel,  OTR/L,CBIS  [phone]  11/21/2020, 10:22 AM  [place], [place], [address] Phone: [phone]   Fax:  [phone]  Name: DACE DENN MRN: [id number] Date of Birth: 11-23-1950

## 2020-11-25 ENCOUNTER — Other Ambulatory Visit: Payer: Self-pay

## 2020-11-25 ENCOUNTER — Ambulatory Visit (HOSPITAL_COMMUNITY): Payer: Medicare Other | Admitting: Occupational Therapy

## 2020-11-25 ENCOUNTER — Encounter (HOSPITAL_COMMUNITY): Payer: Self-pay | Admitting: Occupational Therapy

## 2020-11-25 DIAGNOSIS — R29898 Other symptoms and signs involving the musculoskeletal system: Secondary | ICD-10-CM

## 2020-11-25 DIAGNOSIS — R278 Other lack of coordination: Secondary | ICD-10-CM

## 2020-11-25 NOTE — Therapy (Signed)
[place], [place], [address] Phone: [phone]   Fax:  [phone]  Occupational Therapy Reassessment & Treatment  Patient Details  Name: Anthony Downs MRN: [id number] Date of Birth: [birth year] Referring Provider (OT): Dr. Alger Simons   Encounter Date: 11/25/2020   OT End of Session - 11/25/20 1911     Visit Number 14    Number of Visits 16    Date for OT Re-Evaluation 11/28/20    Authorization Type 1) Medicare 2) UHC    Authorization Time Period 30 visit limit    Authorization - Visit Number 28    Authorization - Number of Visits 30    Progress Note Due on Visit 19    OT Start Time 1515    OT Stop Time 1559    OT Time Calculation (min) 44 min    Activity Tolerance Patient tolerated treatment well    Behavior During Therapy WFL for tasks assessed/performed             Past Medical History:  Diagnosis Date   Acid reflux    Anxiety    Arthritis    Asthma    as a child   Benign brain tumor ([place])    Cancer (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, squamous behind ear   Complication of anesthesia    slow to wake up and pain medicines make him sick   GERD (gastroesophageal reflux disease)    Hypercholesteremia    Hypertension    PONV (postoperative nausea and vomiting)    Sleep apnea    no Cpap   Subdural hematoma, post-traumatic 2008   fell off truck hit head on concrete    Past Surgical History:  Procedure Laterality Date   APPLICATION OF CRANIAL NAVIGATION Left 04/29/2020   Procedure: [place];  Surgeon: Judith Part, MD;  Location: [facility];  Service: Neurosurgery;  Laterality: Left;   arthroscopic knee     COLONOSCOPY N/A 11/29/2018   Procedure: COLONOSCOPY;  Surgeon: Rogene Houston, MD;  [facility];  Service: Endoscopy;  Laterality: N/A;  730-rescheduled 9/2 same time per Ann   CRANIOTOMY Left 04/29/2020   Procedure: LEFT  CRANIECTOMY WITH TUMOR EXCISION;  Surgeon: Judith Part, MD;  Location: [facility];  Service: Neurosurgery;  Laterality: Left;   KNEE SURGERY Right    NASAL SEPTOPLASTY W/ TURBINOPLASTY Bilateral 03/19/2020   Procedure: NASAL SEPTOPLASTY WITH BILATERAL  TURBINATE REDUCTION;  Surgeon: Leta Baptist, MD;  [facility];  Service: ENT;  Laterality: Bilateral;   VASECTOMY      There were no vitals filed for this visit.   Subjective Assessment - 11/25/20 1517     Subjective  Wife reports going to Dr. Naaman Plummer tomorrow.    Patient is accompanied by: Family member   wife   Currently in Pain? No/denies    Pain Location Buttocks    Pain Orientation Posterior    Pain Descriptors / Indicators Aching    Pain Type Acute pain    Pain Radiating Towards N/A    Pain Onset More than a month ago    Pain Frequency Intermittent    Aggravating Factors  sitting too long in power chair    Pain Relieving Factors changing position    Effect of Pain on Daily Activities limited endurance    Multiple Pain Sites No  [facility] OT Assessment - 11/25/20 1521       Assessment   Medical Diagnosis meningioma with tetraplegia      Precautions   Precautions Fall    Precaution Comments Patient uses powered wheelchair      AROM   Overall AROM Comments Unable to assess shoulder flexion and abduction today due to weakness    Right Shoulder Internal Rotation 90 Degrees   same as previous   Right Shoulder External Rotation 25 Degrees   same as previous     PROM   Overall PROM Comments P/ROM of shoulder is approximately 50%, elbow is WFL, wrist is ~25%, digits ~25% due to pain      Strength   Right/Left Shoulder Right    Right Shoulder Flexion 2-/5   2+/5 previous   Right Shoulder ABduction 2-/5   2/5 previous   Right Shoulder Internal Rotation 2+/5   same as previous   Right Shoulder External Rotation 2/5   same as previous   Right/Left Elbow Right    Right Elbow Flexion 4-/5   3/5 previous    Right Elbow Extension 3/5   3-/5 previous   Right Forearm Pronation 2+/5   2-/5 previous   Right Forearm Supination 2-/5   same as previous   Right/Left Wrist Right    Right Wrist Flexion 2-/5   same as previous   Right Wrist Extension 2-/5   same as previous   Right Wrist Radial Deviation 2-/5   same as previous   Right Wrist Ulnar Deviation 2-/5   same as previous   Right/Left hand Right    Right Hand Grip (lbs) 10   20 previous   Right Hand Lateral Pinch 4 lbs   4 prevoius   Right Hand 3 Point Pinch 3 lbs   3 previous                     OT Treatments/Exercises (OP) - 11/25/20 1908       Exercises   Exercises Shoulder;Hand      Shoulder Exercises: Stretch   Elbow Flexion AROM;10 reps      Neurological Re-education Exercises   Shoulder Flexion AAROM;5 reps;Seated   OT giving target to touch   Shoulder ABduction PROM;5 reps    Shoulder Protraction AAROM;10 reps   low and medium levels, OT giving target to touch   Shoulder External Rotation PROM;5 reps    Elbow Extension AROM;10 reps    Finger Flexion A/ROM digit flexion/extension 10X    Other Exercises 1 Scooterboard: shoulder flexion on tabletop 10X                      OT Short Term Goals - 11/25/20 1533       OT SHORT TERM GOAL #1   Title Pt will be provided with and educated on HEP to improve mobility of RUE required for incorporating into ADL tasks.    Time 4    Period Weeks    Status On-going    Target Date 10/25/20      OT SHORT TERM GOAL #2   Title Pt will increase functional use of RUE by 25% to improve ability to incorporate RUE into ADL tasks such as dressing and bathing.    Time 4    Period Weeks    Status Partially Met      OT SHORT TERM GOAL #3   Title Pt will be educated on adaptive strategies and  techniques to improve independence in ADL completion.    Time 4    Period Weeks    Status On-going      OT SHORT TERM GOAL #4   Title Pt will be educated on weightbearing  techniques to facilitate NMR and decrease pain in RUE.    Time 4    Period Weeks      OT SHORT TERM GOAL #5   Title Pt will be educated on available AE and DME to assist with ADL completion as needed for improved independence.    Time 4    Period Weeks    Status On-going      OT SHORT TERM GOAL #6   Title Pt will increase RUE strength to 3+/5 or greater to improve ability to reach to shoulder height or higher when performing functional reaching tasks.    Time 4    Period Weeks    Status On-going      OT SHORT TERM GOAL #7   Title Pt will increase right grip strength by 5# and pinch strength by 2# to improve ability to grasp and hold lightweight objects such as a water bottle.    Time 4    Period Weeks    Status On-going                      Plan - 11/25/20 1912     Clinical Impression Statement A: Reassessment completed this session, pt has not met any additional goals at this time. Pt presenting with decreased functioning since last reassessment, this OT noting a significant difference in RUE strength and functional use. Pt's wife also notes a decline in functioning over the past few weeks and is questioning a medication effect. In mid-October pt was able to actively reach and place pegs, use boomwackers to reach into various planes, and today is unable to actively lift RUE into flexion due to weakness. He also continues to have decreased joint mobility in right wrist and digits with limitations in function and ROM due to pain. OT does note more edema in digits today as well. Discussed observations with pt and wife who would like to discuss potential effect of medications and look for a cause of decline with MD at appointment tomorrow.    Occupational performance deficits (Please refer to evaluation for details): ADL's;IADL's;Rest and Sleep;Leisure    Body Structure / Function / Physical Skills ADL;Endurance;UE functional use;Fascial restriction;Pain;ROM;Sensation;IADL;Skin  integrity;Strength;Edema;Tone    Rehab Potential Fair    Plan P: Discuss MD appt with pt and wife and determine if it would be beneficial to add more appointments or if we need to hold or discharge pt    OT Home Exercise Plan eval: self-ROM for protraction, elbow flexion/extension, and wrist flexion/extension; provided medium edema glove for right hand; 9/12: table slides; 9/27: AA/ROM shoulder protraction and flexion, A/ROM elbow flex/ext, forearm sup/pron, wrist flexion/ext    Consulted and Agree with Plan of Care Patient;Family member/caregiver    Family Member Consulted Wife             Patient will benefit from skilled therapeutic intervention in order to improve the following deficits and impairments:   Body Structure / Function / Physical Skills: ADL, Endurance, UE functional use, Fascial restriction, Pain, ROM, Sensation, IADL, Skin integrity, Strength, Edema, Tone       Visit Diagnosis: Other lack of coordination  Other symptoms and signs involving the musculoskeletal system    Problem List Patient Active Problem  List   Diagnosis Date Noted   Seizure ([place]) 08/13/2020   Seizures ([place]) 08/12/2020   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   DVT (deep venous thrombosis) ([place]) 08/12/2020   COVID-19 virus infection 08/12/2020   Acute lower UTI 07/14/2020   Delirium 07/06/2020   Sleep apnea 07/06/2020   GERD (gastroesophageal reflux disease) 07/06/2020   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[place]) 06/25/2020   Sundowning 06/25/2020   Slow transit constipation    Essential hypertension    Hypokalemia    Postoperative pain    Meningioma ([place]) 05/02/2020   Brain tumor ([place]) 04/28/2020   S/P nasal septoplasty 03/19/2020   Special screening for malignant neoplasms, colon 01/09/2018    Guadelupe Sabin, OTR/L  [phone] 11/25/2020, 7:18 PM  [facility] [address] [place], [place], [address] Phone: [phone]   Fax:  [phone]  Name: Anthony Downs MRN: [id number] Date of Birth: 01/20/1950

## 2020-11-26 ENCOUNTER — Ambulatory Visit (HOSPITAL_COMMUNITY): Payer: Medicare Other

## 2020-11-26 ENCOUNTER — Encounter: Payer: Self-pay | Admitting: Physical Medicine & Rehabilitation

## 2020-11-26 ENCOUNTER — Encounter: Payer: Medicare Other | Attending: Physical Medicine & Rehabilitation | Admitting: Physical Medicine & Rehabilitation

## 2020-11-26 VITALS — BP 128/85 | HR 65 | Ht 73.0 in

## 2020-11-26 DIAGNOSIS — G8250 Quadriplegia, unspecified: Secondary | ICD-10-CM | POA: Insufficient documentation

## 2020-11-26 DIAGNOSIS — Z9889 Other specified postprocedural states: Secondary | ICD-10-CM | POA: Diagnosis present

## 2020-11-26 NOTE — Patient Instructions (Signed)
PLEASE FEEL FREE TO CALL OUR OFFICE WITH ANY PROBLEMS OR QUESTIONS ([phone])      

## 2020-11-26 NOTE — Progress Notes (Signed)
Subjective:    Patient ID: Anthony Downs, male    DOB: 24-Feb-1950, 70 y.o.   MRN: [id number]  HPI Anthony Downs is here in follow up of his tetraplegia and chronic cog/linguistic deficits d/t bilateral meningiomas and resection. I last saw him in August. He continues to work with OT and PT at [facility] and is making some progress. His wife however notes that his legs are weaker since his seizure. He denies any pain his legs.Transfers are max assist. She questions whether its the seizure medication or the seizures themselves. His VPA dose was decreased to 500mg  bid after we found that the level was high. He was on keppra previously. He is followed by neurology for dosing.   From a cognitive standpoint he has been more alert and focused. He tries to participate in his care but is limited by the weakness in his legs.   He's seen urology and hasn't had any further UTI's.   Pain Inventory Average Pain 0 Pain Right Now 0 My pain is  no pain  LOCATION OF PAIN  no pain locations  BOWEL Number of stools per week: 3  Oral laxative use Yes  Type of laxative Senna Plus   BLADDER Normal In and out cath, frequency N/A Able to self cath  N/A Bladder incontinence Yes  Frequent urination No  Leakage with coughing No  Difficulty starting stream No  Incomplete bladder emptying No    Mobility ability to climb steps?  no do you drive?  no use a wheelchair needs help with transfers  Function retired I need assistance with the following:  dressing and toileting  Neuro/Psych weakness tremor trouble walking anxiety  Prior Studies Any changes since last visit?  no  Physicians involved in your care Any changes since last visit?  no   Family History  Problem Relation Age of Onset   Breast cancer Mother    Pancreatic cancer Mother    Aortic aneurysm Father    Social History   Socioeconomic History   Marital status: Married    Spouse name: Not on file   Number of children: 2    Years of education: 12   Highest education level: High school graduate  Occupational History   Occupation: Retired  Tobacco Use   Smoking status: Never   Smokeless tobacco: Never  Vaping Use   Vaping Use: Never used  Substance and Sexual Activity   Alcohol use: No    Comment: no use since 2008   Drug use: No   Sexual activity: Yes  Other Topics Concern   Not on file  Social History Narrative   Lives with wife.   Right-handed.   Two cans Dr. Malachi Bonds daily.   Social Determinants of Health   Financial Resource Strain: Not on file  Food Insecurity: Not on file  Transportation Needs: Not on file  Physical Activity: Not on file  Stress: Not on file  Social Connections: Not on file   Past Surgical History:  Procedure Laterality Date   APPLICATION OF CRANIAL NAVIGATION Left 04/29/2020   Procedure: [place];  Surgeon: Judith Part, MD;  Location: [facility];  Service: Neurosurgery;  Laterality: Left;   arthroscopic knee     COLONOSCOPY N/A 11/29/2018   Procedure: COLONOSCOPY;  Surgeon: Rogene Houston, MD;  [facility];  Service: Endoscopy;  Laterality: N/A;  730-rescheduled 9/2 same time per Ann   CRANIOTOMY Left 04/29/2020   Procedure: LEFT CRANIECTOMY WITH TUMOR  EXCISION;  Surgeon: Judith Part, MD;  Location: [place];  Service: Neurosurgery;  Laterality: Left;   KNEE SURGERY Right    NASAL SEPTOPLASTY W/ TURBINOPLASTY Bilateral 03/19/2020   Procedure: NASAL SEPTOPLASTY WITH BILATERAL  TURBINATE REDUCTION;  Surgeon: Leta Baptist, MD;  [facility];  Service: ENT;  Laterality: Bilateral;   VASECTOMY     Past Medical History:  Diagnosis Date   Acid reflux    Anxiety    Arthritis    Asthma    as a child   Benign brain tumor ([place])    Cancer ([employer])    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, squamous behind ear   Complication of anesthesia    slow to wake up and pain medicines make him sick   GERD (gastroesophageal reflux disease)     Hypercholesteremia    Hypertension    PONV (postoperative nausea and vomiting)    Sleep apnea    no Cpap   Subdural hematoma, post-traumatic 2008   fell off truck hit head on concrete   Ht 6\' 1"  (1.854 m)   BMI 34.64 kg/m   Opioid Risk Score:   Fall Risk Score:  `1  Depression screen PHQ 2/9  Depression screen [facility] 2/9 11/26/2020 09/17/2020 09/17/2020  Decreased Interest 0 3 1  Down, Depressed, Hopeless 0 2 1  PHQ - 2 Score 0 5 2  Altered sleeping - 3 -  Tired, decreased energy - 3 -  Change in appetite - 3 -  Feeling bad or failure about yourself  - 0 -  Trouble concentrating - 3 -  Moving slowly or fidgety/restless - 3 -  Suicidal thoughts - 0 -  PHQ-9 Score - 20 -     Review of Systems  Constitutional: Negative.   HENT: Negative.    Eyes: Negative.   Respiratory: Negative.    Cardiovascular: Negative.   Gastrointestinal: Negative.   Endocrine: Negative.   Genitourinary: Negative.   Musculoskeletal:  Positive for gait problem.  Skin: Negative.   Allergic/Immunologic: Negative.   Neurological:  Positive for tremors and weakness.  Hematological: Negative.   Psychiatric/Behavioral:  The patient is nervous/anxious.       Objective:   Physical Exam Constitutional: No distress . Vital signs reviewed. HEENT: NCAT, EOMI, oral membranes moist Neck: supple Cardiovascular: RRR without murmur. No JVD    Respiratory/Chest: CTA Bilaterally without wheezes or rales. Normal effort    GI/Abdomen: BS +, non-tender, non-distended Ext: no clubbing, cyanosis, or edema Psych: pleasant and cooperative  Skin: intact Neuro: alert, , follows simple commands with delay.  Will speak spontaneously on his own but does not speak readily when spoken to.  Oriented to person. Intentional tremor. Resting tone 2/4 RUE and 2-3/5 with tight heel cord ongoing.  Upper extremity strength grossly 3+ to 4 out of 5.     Initiates more movement in legs but only 1-2/5.  Does seem to sense pain.  Reflexes  are still 3+. Intentional tremor in UE R>L Musculoskeletal: fair sitting posture.          Assessment & Plan:  1. 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 Consider revisiting pt/ot at his next visit -hep       -she will d/w neuro re: change in sz medicine although the leg weakness  may be related to his recent sz.     2.  Left posterior tibial vein DVT: Patient remains on Xarelto 3.  Sleep disorder             -continue seroquel at 50mg  qhs             -continue trazodone 100mg  qhs 4.  Hypertension--per primary 5.  Urinary frequency with incontinence: frequent UTI's             -per urology            -flomax 6.Seizures: depakote 500 mg bid             - recent levels and lft's have been checked   15 minutes of face to face patient care time were spent during this visit. All questions were encouraged and answered. Follow up with me in 2 mos.

## 2020-11-27 ENCOUNTER — Other Ambulatory Visit: Payer: Self-pay

## 2020-11-27 ENCOUNTER — Encounter (HOSPITAL_COMMUNITY): Payer: Self-pay | Admitting: Occupational Therapy

## 2020-11-27 ENCOUNTER — Ambulatory Visit (HOSPITAL_COMMUNITY): Payer: Medicare Other | Admitting: Occupational Therapy

## 2020-11-27 DIAGNOSIS — R278 Other lack of coordination: Secondary | ICD-10-CM | POA: Diagnosis not present

## 2020-11-27 DIAGNOSIS — R29898 Other symptoms and signs involving the musculoskeletal system: Secondary | ICD-10-CM

## 2020-11-27 NOTE — Patient Instructions (Addendum)
Perform each exercise ____10____ reps. 2-3x days.   1) Protraction   Start by holding a wand or cane at chest height.  Next, slowly push the wand outwards in front of your body so that your elbows become fully straightened. Then, return to the original position.     2) Shoulder FLEXION   In the standing position, hold a wand/cane with both arms, palms down on both sides. Raise up the wand/cane allowing your unaffected arm to perform most of the effort. Your affected arm should be partially relaxed.      3) Internal/External ROTATION   In the standing position, hold a wand/cane with both hands keeping your elbows bent. Move your arms and wand/cane to one side.  Your affected arm should be partially relaxed while your unaffected arm performs most of the effort.         4) Elbow flexion and extension Bend your elbow upwards as shown and then lower to a straighten position.     5) Forearm supination and pronation Hold elbow at a right angle stabilizing on a table or armrest. Keep elbow at side and turn palm up and down.      6) Wrist Flexion  Start with wrist at edge of table, palm facing up. With wrist hanging slightly off table, curl wrist upward, and back down.      7) Wrist Extension  Start with wrist at edge of table, palm facing down. With wrist slightly off the edge of the table, curl wrist up and back down.         Hand Exercises:    AROM: Finger Flexion / Extension   Actively bend fingers of right hand. Start with knuckles furthest from palm, and slowly make a fist. Then straighten fingers as far as possible. Repeat __10__ times per set. Do __1__ sets per session. Do __2-3__ sessions per day.  Copyright  VHI. All rights reserved.   Paper Crumpling Exercise   Begin with right palm down on piece of paper. Maintaining contact between surface and heel of hand, crumple paper into a ball. Repeat __10__ times per set. Do ___1_ sets per session. Do  __2-3__ sessions per day.  Copyright  VHI. All rights reserved.     Towel Roll Squeeze   With right forearm resting on surface, gently squeeze towel. Repeat _10___ times per set. Do __1__ sets per session. Do __2-3__ sessions per day.  Copyright  VHI. All rights reserved.   Abduction / Adduction (Active)    With hand flat on table, spread all fingers apart, then bring them together as close as possible. Repeat _10___ times. Do __2-3__ sessions per day.
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
## 2020-12-02 ENCOUNTER — Telehealth: Payer: Self-pay | Admitting: Orthopedic Surgery

## 2020-12-02 ENCOUNTER — Ambulatory Visit: Payer: Medicare Other | Admitting: Neurology

## 2020-12-02 DIAGNOSIS — M19019 Primary osteoarthritis, unspecified shoulder: Secondary | ICD-10-CM

## 2020-12-02 NOTE — Telephone Encounter (Signed)
Patient inquiring about having another flouro injection, right shoulder. Please advise.

## 2020-12-02 NOTE — Telephone Encounter (Signed)
Pt would like a repeat right shoulder injection at [facility], last injection 09/03/20.

## 2020-12-03 ENCOUNTER — Other Ambulatory Visit: Payer: Self-pay

## 2020-12-03 ENCOUNTER — Ambulatory Visit (HOSPITAL_COMMUNITY): Payer: Medicare Other

## 2020-12-03 DIAGNOSIS — R278 Other lack of coordination: Secondary | ICD-10-CM

## 2020-12-03 DIAGNOSIS — R29898 Other symptoms and signs involving the musculoskeletal system: Secondary | ICD-10-CM

## 2020-12-03 DIAGNOSIS — M6281 Muscle weakness (generalized): Secondary | ICD-10-CM
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
## 2020-12-03 NOTE — Telephone Encounter (Signed)
No answer and mailbox full. Will attempt to call again tomorrow.

## 2020-12-04 NOTE — Telephone Encounter (Signed)
Pt notified orders placed  

## 2020-12-09 ENCOUNTER — Encounter (HOSPITAL_COMMUNITY): Payer: Self-pay

## 2020-12-09 ENCOUNTER — Other Ambulatory Visit: Payer: Self-pay

## 2020-12-09 ENCOUNTER — Ambulatory Visit (HOSPITAL_COMMUNITY)
Admission: RE | Admit: 2020-12-09 | Discharge: 2020-12-09 | Disposition: A | Discharge status: Home / Self Care | Payer: Medicare Other | Source: Ambulatory Visit | Attending: Orthopedic Surgery | Admitting: Orthopedic Surgery

## 2020-12-09 DIAGNOSIS — M19019 Primary osteoarthritis, unspecified shoulder: Secondary | ICD-10-CM

## 2020-12-09 DIAGNOSIS — M19011 Primary osteoarthritis, right shoulder: Secondary | ICD-10-CM | POA: Insufficient documentation

## 2020-12-09 MED ORDER — BUPIVACAINE HCL (PF) 0.5 % IJ SOLN
INTRAMUSCULAR | Status: AC
  Administered 2020-12-09: 3 mL
  Filled 2020-12-09: qty 30

## 2020-12-09 MED ORDER — LIDOCAINE HCL (PF) 1 % IJ SOLN
INTRAMUSCULAR | Status: AC
  Administered 2020-12-09: 3 mL
  Filled 2020-12-09: qty 5

## 2020-12-09 MED ORDER — LIDOCAINE HCL (PF) 2 % IJ SOLN
INTRAMUSCULAR | Status: AC
  Filled 2020-12-09: qty 5

## 2020-12-09 MED ORDER — METHYLPREDNISOLONE ACETATE 40 MG/ML IJ SUSP
INTRAMUSCULAR | Status: AC
  Administered 2020-12-09: 40 mg
  Filled 2020-12-09: qty 1

## 2020-12-09 MED ORDER — IOHEXOL 180 MG/ML  SOLN
20.0000 mL | Freq: Once | INTRAMUSCULAR | Status: AC | PRN
  Administered 2020-12-09: 2 mL

## 2020-12-18 ENCOUNTER — Encounter: Payer: Self-pay | Admitting: Physical Medicine & Rehabilitation

## 2020-12-18 ENCOUNTER — Telehealth: Payer: Self-pay | Admitting: Neurology

## 2020-12-18 NOTE — Addendum Note (Signed)
Addended by: Noberto Retort C on: 12/18/2020 12:18 PM   Modules accepted: Orders

## 2020-12-18 NOTE — Telephone Encounter (Signed)
Pt's wife is asking if the current medication is causing pt's weakness or if this is coming from the seizure in July.  Wife asking if there is another medication they may help pt's weakness.  Please call

## 2020-12-18 NOTE — Telephone Encounter (Signed)
I spoke to the patient's wife. The methylphenidate 5mg  daily is helpful for his wakefulness. She has reduced the quetiapine to 25mg  at bedtime. He has continued divalproex 500mg , one tab BID. Says he was stronger after completing 6-8 week of PT (completed two weeks ago). However, he has maxed out his insurance allowance of visits for the year. Plans to restart it again in 2023. She is trying to continue his home exercises as much as possible. Feels like his leg weakness is worse. She understands this progression in weakness is not related to his seizure or medications from [place]. He may need an earlier evaluation with Dr. Naaman Plummer. I reviewed this plan with Dr. April Manson and he is in agreement this is the correct direction. The patient's wife verbalized understanding.

## 2021-01-05 ENCOUNTER — Encounter: Payer: Self-pay | Admitting: Urology

## 2021-01-05 ENCOUNTER — Ambulatory Visit (INDEPENDENT_AMBULATORY_CARE_PROVIDER_SITE_OTHER): Payer: Medicare Other | Admitting: Urology

## 2021-01-05 ENCOUNTER — Other Ambulatory Visit: Payer: Self-pay

## 2021-01-05 VITALS — BP 124/85 | HR 60

## 2021-01-05 DIAGNOSIS — R350 Frequency of micturition: Secondary | ICD-10-CM | POA: Diagnosis not present

## 2021-01-05 DIAGNOSIS — N401 Enlarged prostate with lower urinary tract symptoms: Secondary | ICD-10-CM | POA: Diagnosis not present

## 2021-01-05 DIAGNOSIS — N138 Other obstructive and reflux uropathy: Secondary | ICD-10-CM

## 2021-01-05 LAB — URINALYSIS, ROUTINE W REFLEX MICROSCOPIC
Bilirubin, UA: NEGATIVE
Glucose, UA: NEGATIVE
Ketones, UA: NEGATIVE
Leukocytes,UA: NEGATIVE
Nitrite, UA: NEGATIVE
Protein,UA: NEGATIVE
RBC, UA: NEGATIVE
Specific Gravity, UA: 1.025 (ref 1.005–1.030)
Urobilinogen, Ur: 0.2 mg/dL (ref 0.2–1.0)
pH, UA: 5.5 (ref 5.0–7.5)

## 2021-01-05 LAB — BLADDER SCAN AMB NON-IMAGING

## 2021-01-05 NOTE — Progress Notes (Signed)
Urological Symptom Review  Patient is experiencing the following symptoms: Stream starts and stops Urinary tract infection   Review of Systems  Gastrointestinal (upper)  : Negative for upper GI symptoms  Gastrointestinal (lower) : Diarrhea  Constitutional : Fatigue  Skin: Negative for skin symptoms  Eyes: Negative for eye symptoms  Ear/Nose/Throat : Negative for Ear/Nose/Throat symptoms  Hematologic/Lymphatic: Negative for Hematologic/Lymphatic symptoms  Cardiovascular : Negative for cardiovascular symptoms  Respiratory : Negative for respiratory symptoms  Endocrine: Negative for endocrine symptoms  Musculoskeletal: Joint pain  Neurological: Negative for neurological symptoms  Psychologic: Negative for psychiatric symptoms
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
## 2021-01-05 NOTE — Progress Notes (Signed)
post void residual=35ml

## 2021-01-06 LAB — PSA: Prostate Specific Ag, Serum: 1.2 ng/mL (ref 0.0–4.0)

## 2021-01-26 ENCOUNTER — Other Ambulatory Visit: Payer: Self-pay

## 2021-01-26 ENCOUNTER — Ambulatory Visit (INDEPENDENT_AMBULATORY_CARE_PROVIDER_SITE_OTHER): Payer: Medicare Other | Admitting: Dermatology

## 2021-01-26 DIAGNOSIS — Z85828 Personal history of other malignant neoplasm of skin: Secondary | ICD-10-CM

## 2021-01-26 DIAGNOSIS — D485 Neoplasm of uncertain behavior of skin: Secondary | ICD-10-CM

## 2021-01-26 DIAGNOSIS — C4441 Basal cell carcinoma of skin of scalp and neck: Secondary | ICD-10-CM

## 2021-01-26 DIAGNOSIS — C4491 Basal cell carcinoma of skin, unspecified: Secondary | ICD-10-CM

## 2021-01-26 DIAGNOSIS — Z1283 Encounter for screening for malignant neoplasm of skin: Secondary | ICD-10-CM | POA: Diagnosis not present

## 2021-01-26 HISTORY — DX: Basal cell carcinoma of skin, unspecified: C44.91

## 2021-01-26 NOTE — Patient Instructions (Signed)

## 2021-02-02 ENCOUNTER — Telehealth: Payer: Self-pay | Admitting: *Deleted

## 2021-02-02 ENCOUNTER — Encounter: Payer: Self-pay | Admitting: *Deleted

## 2021-02-02 NOTE — Telephone Encounter (Signed)
Path to patient. Mohs referral sent to skin surgery center.

## 2021-02-02 NOTE — Telephone Encounter (Signed)
-----   Message from Lavonna Monarch, MD sent at 01/31/2021  7:30 AM EST ----- Patient has 2 adjacent BCCs 1 of which might be locally prone to recur so Dr. Darene Lamer recommends removal by Mohs surgery.

## 2021-02-10 ENCOUNTER — Ambulatory Visit: Payer: Medicare Other | Admitting: Neurology

## 2021-02-18 ENCOUNTER — Ambulatory Visit: Payer: Medicare Other | Admitting: Neurology

## 2021-02-18 ENCOUNTER — Encounter: Payer: Self-pay | Admitting: Dermatology

## 2021-02-18 NOTE — Progress Notes (Signed)
° °  Follow-Up Visit   Subjective  Anthony Downs is a 71 y.o. male who presents for the following: Skin Problem (Has a sore on back of right ear. X 1 month ago. 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 /).  Skin check, change in spot spine right ear (history of Mohs surgery on that year) Location:  Duration:  Quality:  Associated Signs/Symptoms: Modifying Factors:  Severity:  Timing: Context:   Objective  Well appearing patient in no apparent distress; mood and affect are within normal limits. Waist up skin examination, no atypical pigmented lesions.  2 possible nonmelanoma skin cancers in back of right ear will be biopsied and may be referred for Mohs surgery.  Right Postauricular Area - Superior Waxy 6 mm focally eroded papule       Right Postauricular Area - Inferior Waxy 7 mm crust almost contiguous with other lesion.       All skin waist up examined.   Assessment & Plan    Neoplasm of uncertain behavior of skin (2) Right Postauricular Area - Superior  Skin / nail biopsy Type of biopsy: tangential   Informed consent: discussed and consent obtained   Timeout: patient name, date of birth, surgical site, and procedure verified   Anesthesia: the lesion was anesthetized in a standard fashion   Anesthetic:  1% lidocaine w/ epinephrine 1-100,000 local infiltration Instrument used: flexible razor blade   Hemostasis achieved with: ferric subsulfate   Outcome: patient tolerated procedure well   Post-procedure details: sterile dressing applied and wound care instructions given   Dressing type: bandage and petrolatum    Specimen 1 - Surgical pathology Differential Diagnosis: R/O BCC VS SCC  Check Margins: No  Right Postauricular Area - Inferior  Skin / nail biopsy Type of biopsy: tangential   Informed consent: discussed and consent obtained   Timeout: patient name, date of birth, surgical site, and procedure verified   Anesthesia: the lesion was anesthetized  in a standard fashion   Anesthetic:  1% lidocaine w/ epinephrine 1-100,000 local infiltration Instrument used: flexible razor blade   Hemostasis achieved with: ferric subsulfate   Outcome: patient tolerated procedure well   Post-procedure details: sterile dressing applied and wound care instructions given   Dressing type: bandage and petrolatum    Specimen 2 - Surgical pathology Differential Diagnosis: R/O BCC VS SCC  Check Margins: No  Encounter for screening for malignant neoplasm of skin  Annual skin examination.      I, Lavonna Monarch, MD, have reviewed all documentation for this visit.  The documentation on 02/18/21 for the exam, diagnosis, procedures, and orders are all accurate and complete.

## 2021-02-26 ENCOUNTER — Ambulatory Visit (INDEPENDENT_AMBULATORY_CARE_PROVIDER_SITE_OTHER): Payer: Medicare Other | Admitting: Neurology

## 2021-02-26 ENCOUNTER — Encounter: Payer: Self-pay | Admitting: Neurology

## 2021-02-26 VITALS — BP 133/77 | HR 59

## 2021-02-26 DIAGNOSIS — R569 Unspecified convulsions: Secondary | ICD-10-CM

## 2021-02-26 DIAGNOSIS — Z9889 Other specified postprocedural states: Secondary | ICD-10-CM

## 2021-02-26 DIAGNOSIS — G250 Essential tremor: Secondary | ICD-10-CM

## 2021-02-26 DIAGNOSIS — F070 Personality change due to known physiological condition: Secondary | ICD-10-CM | POA: Diagnosis not present

## 2021-02-26 DIAGNOSIS — Z86018 Personal history of other benign neoplasm: Secondary | ICD-10-CM

## 2021-02-26 DIAGNOSIS — Z5181 Encounter for therapeutic drug level monitoring: Secondary | ICD-10-CM

## 2021-02-26 MED ORDER — DIVALPROEX SODIUM 500 MG PO DR TAB: 500.0000 mg | DELAYED_RELEASE_TABLET | Freq: Two times a day (BID) | ORAL | 4 refills | Status: DC
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
## 2021-02-27 LAB — CBC WITH DIFFERENTIAL/PLATELET
Basophils Absolute: 0 10*3/uL (ref 0.0–0.2)
Basos: 1 %
EOS (ABSOLUTE): 0.2 10*3/uL (ref 0.0–0.4)
Eos: 4 %
Hematocrit: 42 % (ref 37.5–51.0)
Hemoglobin: 13.9 g/dL (ref 13.0–17.7)
Immature Grans (Abs): 0 10*3/uL (ref 0.0–0.1)
Immature Granulocytes: 0 %
Lymphocytes Absolute: 2.3 10*3/uL (ref 0.7–3.1)
Lymphs: 40 %
MCH: 30.6 pg (ref 26.6–33.0)
MCHC: 33.1 g/dL (ref 31.5–35.7)
MCV: 93 fL (ref 79–97)
Monocytes Absolute: 0.4 10*3/uL (ref 0.1–0.9)
Monocytes: 8 %
Neutrophils Absolute: 2.8 10*3/uL (ref 1.4–7.0)
Neutrophils: 47 %
Platelets: 158 10*3/uL (ref 150–450)
RBC: 4.54 x10E6/uL (ref 4.14–5.80)
RDW: 13.1 % (ref 11.6–15.4)
WBC: 5.8 10*3/uL (ref 3.4–10.8)

## 2021-02-27 LAB — COMPREHENSIVE METABOLIC PANEL
ALT: 9 IU/L (ref 0–44)
AST: 9 IU/L (ref 0–40)
Albumin/Globulin Ratio: 1.6 (ref 1.2–2.2)
Albumin: 3.5 g/dL — ABNORMAL LOW (ref 3.7–4.7)
Alkaline Phosphatase: 80 IU/L (ref 44–121)
BUN/Creatinine Ratio: 15 (ref 10–24)
BUN: 13 mg/dL (ref 8–27)
Bilirubin Total: <0.2 mg/dL (ref 0.0–1.2)
CO2: 26 mmol/L (ref 20–29)
Calcium: 9 mg/dL (ref 8.6–10.2)
Chloride: 103 mmol/L (ref 96–106)
Creatinine, Ser: 0.89 mg/dL (ref 0.76–1.27)
Globulin, Total: 2.2 g/dL (ref 1.5–4.5)
Glucose: 108 mg/dL — ABNORMAL HIGH (ref 70–99)
Potassium: 4.3 mmol/L (ref 3.5–5.2)
Sodium: 143 mmol/L (ref 134–144)
Total Protein: 5.7 g/dL — ABNORMAL LOW (ref 6.0–8.5)
eGFR: 91 mL/min/{1.73_m2} (ref >59)

## 2021-02-27 LAB — VALPROIC ACID LEVEL: Valproic Acid Lvl: 46 ug/mL — ABNORMAL LOW (ref 50–100)

## 2021-02-27 LAB — TSH: TSH: 4.22 u[IU]/mL (ref 0.450–4.500)

## 2021-02-27 NOTE — Patient Instructions (Addendum)
Continue with Depakote 500 mg BID  We will obtain a Depakote level today  Continue with propanolol 20 mg daily  Follow up your primary care physician  Return in 1 year

## 2021-03-04 ENCOUNTER — Other Ambulatory Visit: Payer: Self-pay

## 2021-03-04 ENCOUNTER — Encounter: Payer: Medicare Other | Attending: Physical Medicine & Rehabilitation | Admitting: Physical Medicine & Rehabilitation

## 2021-03-04 ENCOUNTER — Encounter: Payer: Self-pay | Admitting: Physical Medicine & Rehabilitation

## 2021-03-04 VITALS — BP 128/85 | HR 65 | Ht 73.0 in

## 2021-03-04 DIAGNOSIS — R569 Unspecified convulsions: Secondary | ICD-10-CM | POA: Insufficient documentation

## 2021-03-04 DIAGNOSIS — D329 Benign neoplasm of meninges, unspecified: Secondary | ICD-10-CM | POA: Diagnosis present

## 2021-03-04 DIAGNOSIS — G8250 Quadriplegia, unspecified: Secondary | ICD-10-CM | POA: Diagnosis present

## 2021-03-04 NOTE — Patient Instructions (Signed)
PLEASE FEEL FREE TO CALL OUR OFFICE WITH ANY PROBLEMS OR QUESTIONS ([phone])      

## 2021-03-04 NOTE — Progress Notes (Signed)
Subjective:    Patient ID: Anthony Downs, male    DOB: 1950/04/11, 71 y.o.   MRN: [id number]  HPI  Anthony Downs is here in follow up of his meningioma and associated weakness and cognitive deficits. He says that he's been sitting around watching a bunch of "old westerns".  He is controlling his bladder better using a urinal but his bowels have not been as a reliable. He is emptying the bowels inconsistently and has incontinence. Usually it's hard followed by mushy stool.   He is using the SAEBO at home on his RUE and is able to lift his right arm to his head now. He is also moving his RLE more.   From a cognitive standpoint he has improved. He is off seroquel and hydroxizine and is only on 50mg  at trazodone which seemed to help him also. His sleep has maintained.   Pain Inventory Average Pain 0 Pain Right Now 0 My pain is  no pain  LOCATION OF PAIN  no pain  BOWEL Number of stools per week: 3 Oral laxative use No  Enema or suppository use No  History of colostomy No  Incontinent Yes   BLADDER Normal and Pads Bladder incontinence Yes  Frequent urination No  Leakage with coughing No  Difficulty starting stream No  Incomplete bladder emptying No    Mobility ability to climb steps?  no do you drive?  no use a wheelchair  Function not employed: date last employed 2008 I need assistance with the following:  dressing, bathing, and toileting  Neuro/Psych bladder control problems weakness trouble walking  Prior Studies Any changes since last visit?  no  Physicians involved in your care Any changes since last visit?  no   Family History  Problem Relation Age of Onset   Breast cancer Mother    Pancreatic cancer Mother    Aortic aneurysm Father    Social History   Socioeconomic History   Marital status: Married    Spouse name: Not on file   Number of children: 2   Years of education: 12   Highest education level: High school graduate  Occupational History    Occupation: Retired  Tobacco Use   Smoking status: Never   Smokeless tobacco: Never  Vaping Use   Vaping Use: Never used  Substance and Sexual Activity   Alcohol use: No    Comment: no use since 2008   Drug use: No   Sexual activity: Yes  Other Topics Concern   Not on file  Social History Narrative   Lives with wife.   Right-handed.   Two cans Dr. Malachi Bonds daily.   Social Determinants of Health   Financial Resource Strain: Not on file  Food Insecurity: Not on file  Transportation Needs: Not on file  Physical Activity: Not on file  Stress: Not on file  Social Connections: Not on file   Past Surgical History:  Procedure Laterality Date   APPLICATION OF CRANIAL NAVIGATION Left 04/29/2020   Procedure: [place];  Surgeon: Judith Part, MD;  Location: [facility];  Service: Neurosurgery;  Laterality: Left;   arthroscopic knee     COLONOSCOPY N/A 11/29/2018   Procedure: COLONOSCOPY;  Surgeon: Rogene Houston, MD;  [facility];  Service: Endoscopy;  Laterality: N/A;  730-rescheduled 9/2 same time per Ann   CRANIOTOMY Left 04/29/2020   Procedure: LEFT CRANIECTOMY WITH TUMOR EXCISION;  Surgeon: Judith Part, MD;  Location: [place];  Service: Neurosurgery;  Laterality: Left;   KNEE SURGERY Right    NASAL SEPTOPLASTY W/ TURBINOPLASTY Bilateral 03/19/2020   Procedure: NASAL SEPTOPLASTY WITH BILATERAL  TURBINATE REDUCTION;  Surgeon: Leta Baptist, MD;  Location: [facility];  Service: ENT;  Laterality: Bilateral;   VASECTOMY     Past Medical History:  Diagnosis Date   Acid reflux    Anxiety    Arthritis    Asthma    as a child   Basal cell carcinoma 01/26/2021   RIGHT POST SURICULAR INFERIOR   Basal cell carcinoma 01/26/2021   RIGHT POST AURICULAR SUPERIOR   Benign brain tumor ([place])    Cancer ([place])    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, squamous behind ear   Complication of anesthesia    slow to wake up and pain medicines make him sick   GERD  (gastroesophageal reflux disease)    Hypercholesteremia    Hypertension    PONV (postoperative nausea and vomiting)    Sleep apnea    no Cpap   Subdural hematoma, post-traumatic 2008   fell off truck hit head on concrete   BP 128/85    Pulse 65    Ht 6\' 1"  (1.854 m)    SpO2 97%    BMI 34.64 kg/m   Opioid Risk Score:   Fall Risk Score:  `1  Depression screen PHQ 2/9  Depression screen [facility] 2/9 03/04/2021 11/26/2020 09/17/2020 09/17/2020  Decreased Interest 0 0 3 1  Down, Depressed, Hopeless 0 0 2 1  PHQ - 2 Score 0 0 5 2  Altered sleeping - - 3 -  Tired, decreased energy - - 3 -  Change in appetite - - 3 -  Feeling bad or failure about yourself  - - 0 -  Trouble concentrating - - 3 -  Moving slowly or fidgety/restless - - 3 -  Suicidal thoughts - - 0 -  PHQ-9 Score - - 20 -     Review of Systems  Constitutional:  Positive for unexpected weight change.  HENT: Negative.    Eyes: Negative.   Respiratory:  Positive for apnea.   Cardiovascular: Negative.   Gastrointestinal: Negative.   Endocrine: Negative.   Genitourinary: Negative.   Musculoskeletal:  Positive for gait problem.  Skin: Negative.   Allergic/Immunologic: Negative.   Hematological: Negative.   Psychiatric/Behavioral: Negative.        Objective:   Physical Exam  Constitutional: No distress . Vital signs reviewed. HEENT: NCAT, EOMI, oral membranes moist Neck: supple Cardiovascular: RRR without murmur. No JVD    Respiratory/Chest: CTA Bilaterally without wheezes or rales. Normal effort    GI/Abdomen: BS +, non-tender, non-distended Ext: no clubbing, cyanosis, or edema Psych: pleasant and cooperative  Skin: intact Neuro: very alert, initiates more. Speech clear/loud .  Oriented to person. Intentional tremor. Resting tone 1-2/4 RUE and 1-2/4 with tight heel cord improved.  Upper extremity strength grossly  4 out of 5 on left, RUE 2/5. LLE 1 to 2+/5. RLE trace/t.        Reflexes are still 3+. Intentional tremor  in UE R>L Musculoskeletal:  good sitting posture         Assessment & Plan:  1. 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 Eventual aquatic therapy -hep       -  2.  Left posterior tibial vein DVT: Patient remains on Xarelto 3.  Sleep disorder             -continue trazodone 50mg  qhs 4.  Neurogenic bowel:  -suggested suppository or dig stim on toilet once he's to that point 5.  Urinary frequency with incontinence: frequent UTI's             -per urology            -flomax  -timed voids,   -use of commode/toilet 6.Seizures: depakote 500 mg bid             - recent levels and lft's have been checked   15 minutes of face to face patient care time were spent during this visit. All questions were encouraged and answered. Follow up with me in 2 mos.

## 2021-04-01 ENCOUNTER — Encounter: Payer: Self-pay | Admitting: Occupational Therapy

## 2021-04-01 ENCOUNTER — Ambulatory Visit: Payer: Medicare Other | Attending: Physical Medicine & Rehabilitation | Admitting: Rehabilitation

## 2021-04-01 ENCOUNTER — Other Ambulatory Visit: Payer: Self-pay

## 2021-04-01 ENCOUNTER — Encounter: Payer: Self-pay | Admitting: Rehabilitation

## 2021-04-01 ENCOUNTER — Ambulatory Visit: Payer: Medicare Other | Admitting: Occupational Therapy

## 2021-04-01 DIAGNOSIS — R569 Unspecified convulsions: Secondary | ICD-10-CM | POA: Diagnosis not present

## 2021-04-01 DIAGNOSIS — M25611 Stiffness of right shoulder, not elsewhere classified: Secondary | ICD-10-CM | POA: Diagnosis present

## 2021-04-01 DIAGNOSIS — R251 Tremor, unspecified: Secondary | ICD-10-CM | POA: Insufficient documentation

## 2021-04-01 DIAGNOSIS — M25621 Stiffness of right elbow, not elsewhere classified: Secondary | ICD-10-CM | POA: Insufficient documentation

## 2021-04-01 DIAGNOSIS — R208 Other disturbances of skin sensation: Secondary | ICD-10-CM

## 2021-04-01 DIAGNOSIS — R278 Other lack of coordination: Secondary | ICD-10-CM

## 2021-04-01 DIAGNOSIS — R293 Abnormal posture: Secondary | ICD-10-CM | POA: Diagnosis present

## 2021-04-01 DIAGNOSIS — M6281 Muscle weakness (generalized): Secondary | ICD-10-CM | POA: Diagnosis not present

## 2021-04-01 DIAGNOSIS — D329 Benign neoplasm of meninges, unspecified: Secondary | ICD-10-CM | POA: Diagnosis not present

## 2021-04-01 DIAGNOSIS — G8250 Quadriplegia, unspecified: Secondary | ICD-10-CM | POA: Diagnosis not present

## 2021-04-01 DIAGNOSIS — R29898 Other symptoms and signs involving the musculoskeletal system: Secondary | ICD-10-CM

## 2021-04-01 DIAGNOSIS — R2689 Other abnormalities of gait and mobility: Secondary | ICD-10-CM

## 2021-04-01 NOTE — Therapy (Addendum)
?OUTPATIENT PHYSICAL THERAPY NEURO EVALUATION ? ? ?Patient Name: Anthony Downs ?MRN: [id number] ?DOB:10-28-1950, 71 y.o., male ?Today's Date: 04/01/2021 ? ?PCP: Redmond School, MD ?REFERRING PROVIDER: [name], MD ? ? PT End of Session - 04/01/21 1238   ? ? Visit Number 1   ? Number of Visits 17   ? Date for PT Re-Evaluation 05/31/21   ? Authorization Type Medicare and UHC (needs 10th visit progress note)   ? Progress Note Due on Visit 10   ? PT Start Time 1135   ? PT Stop Time 1230   ? PT Time Calculation (min) 55 min   ? Equipment Utilized During Treatment --   stedy for transfers  ? Activity Tolerance Patient tolerated treatment well   ? Behavior During Therapy [facility] for tasks assessed/performed   ? ?  ?  ? ?  ? ? ?Past Medical History:  ?Diagnosis Date  ? Acid reflux   ? Anxiety   ? Arthritis   ? Asthma   ? as a child  ? Basal cell carcinoma 01/26/2021  ? RIGHT POST SURICULAR INFERIOR  ? Basal cell carcinoma 01/26/2021  ? RIGHT POST AURICULAR SUPERIOR  ? Benign brain tumor ([place])   ? Cancer [facility])   ? 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, squamous behind ear  ? Complication of anesthesia   ? slow to wake up and pain medicines make him sick  ? GERD (gastroesophageal reflux disease)   ? Hypercholesteremia   ? Hypertension   ? PONV (postoperative nausea and vomiting)   ? Sleep apnea   ? no Cpap  ? Subdural hematoma, post-traumatic 2008  ? fell off truck hit head on concrete  ? ?Past Surgical History:  ?Procedure Laterality Date  ? APPLICATION OF CRANIAL NAVIGATION Left 04/29/2020  ? Procedure: APPLICATION OF CRANIAL NAVIGATION;  Surgeon: Judith Part, MD;  Location: [place];  Service: Neurosurgery;  Laterality: Left;  ? arthroscopic knee    ? COLONOSCOPY N/A 11/29/2018  ? Procedure: COLONOSCOPY;  Surgeon: Rogene Houston, MD;  [facility];  Service: Endoscopy;  Laterality: N/A;  730-rescheduled 9/2 same time per Lelon Frohlich  ? CRANIOTOMY Left 04/29/2020  ? Procedure: LEFT CRANIECTOMY WITH TUMOR  EXCISION;  Surgeon: Judith Part, MD;  Location: [place];  Service: Neurosurgery;  Laterality: Left;  ? KNEE SURGERY Right   ? NASAL SEPTOPLASTY W/ TURBINOPLASTY Bilateral 03/19/2020  ? Procedure: NASAL SEPTOPLASTY WITH BILATERAL  TURBINATE REDUCTION;  Surgeon: Leta Baptist, MD;  Location: [place];  Service: ENT;  Laterality: Bilateral;  ? VASECTOMY    ? ?Patient Active Problem List  ? Diagnosis Date Noted  ? Seizure ([place]) 08/13/2020  ? Seizures ([place]) 08/12/2020  ? 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 DVT (deep venous thrombosis) ([place]) 08/12/2020  ? COVID-19 virus infection 08/12/2020  ? Acute lower UTI 07/14/2020  ? Delirium 07/06/2020  ? Sleep apnea 07/06/2020  ? GERD (gastroesophageal reflux disease) 07/06/2020  ? 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[place]) 06/25/2020  ? Sundowning 06/25/2020  ? Slow transit constipation   ? Essential hypertension   ? Hypokalemia   ? Postoperative pain   ? Meningioma ([place]) 05/02/2020  ? Brain tumor ([place]) 04/28/2020  ? S/P nasal septoplasty 03/19/2020  ? Special screening for malignant neoplasms, colon 01/09/2018  ? ? ?ONSET DATE: April 2022 ? ?REFERRING DIAG: D32.9 (ICD-10-CM) - Meningioma ([place]) G82.50 (ICD-10-CM) - 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[place]) R56.9 (ICD-10-CM) - Seizures ([place])  ? ?THERAPY DIAG:  ?Muscle weakness (generalized) ? ?Other symptoms and  signs involving the musculoskeletal system ? ?Other lack of coordination ? ?SUBJECTIVE:  ?                                                                                                                                                                                           ? ?SUBJECTIVE STATEMENT: ?"I want to be able to walk.  I have been able to move my arm a little more and my leg I can lift a little to put my shoe on." ?Pt accompanied by: family member sister in law Pamala Hurry) ? ?PERTINENT HISTORY: anxiety, OA, cancer, subdural hematoma  ? ?PAIN:  ?Are you having pain? No Sometimes does have pain in B ischial tuberosities when sitting in w/c too long.   ? ?PRECAUTIONS: Fall ? ?WEIGHT BEARING RESTRICTIONS No ? ?FALLS: Has patient fallen in last 6 months? No, Number of falls: 0 ? ?LIVING ENVIRONMENT: ?Lives with: lives with their family and lives with their spouse ?Lives in: House/apartment ?Stairs: No; ramped entrance  ?Has following equipment at home: Wheelchair (power) and has walk in shower with small lip to clear, no shower equipment yet. ? ?PLOF:  He is able to stand using the [place] mostly by himself.   ? ?PATIENT GOALS "I want to walk"  ? ?OBJECTIVE:  ? 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 There are mild chronic small-vessel ischemic ?changes of the white matter. There is atrophy, encephalomalacia and ?gliosis along the medial aspects of both sides of the brain at the ?frontoparietal vertex, sequela of the previous tumor and subsequent ?resection. It appears that the midportion of the superior sagittal ?sinus has been resected. No evidence of tumor in the venous sinuses ?anterior or posterior to that. ? ?COGNITION: ?Overall cognitive status: Within functional limits for tasks assessed, some delay in processing verbal commands.  ?  ?SENSATION: ?Light touch: WFL ? ?COORDINATION: ?Decreased due to BLE weakness (R>L) ? ?EDEMA:  ?Swelling noted in RUE and BLE, did not measure.  ? ?MUSCLE TONE: No tone noted ? ? ?MUSCLE LENGTH: ?Hamstrings: Right approx 5-10 deg from full ext, Left marked lag, approx 20-30 deg from extension  ? ? ? ?POSTURE: rounded shoulders, forward head, increased thoracic kyphosis, posterior pelvic tilt, and flexed trunk  ? ?LE ROM:   Will formally assess at future visit. ? ?Passive  Right ?04/01/2021 Left ?04/01/2021  ?Hip flexion    ?Hip extension    ?Hip abduction    ?Hip adduction    ?Hip internal rotation    ?Hip external rotation    ?Knee flexion    ?  Knee extension    ?Ankle dorsiflexion    ?Ankle plantarflexion    ?Ankle inversion     ?Ankle eversion    ? (Blank rows = not tested) ? ?MMT:   ? ?MMT Right ?04/01/2021 Left ?04/01/2021  ?Hip flexion 1/5 3-/5  ?Hip extension    ?Hip abduction    ?Hip adduction    ?Hip internal rotation    ?Hip external rotation    ?Knee flexion 0/5 3-/5  ?Knee extension 0/5 3-/5  ?Ankle dorsiflexion 0/5 1/5  ?Ankle plantarflexion    ?Ankle inversion    ?Ankle eversion    ?(Blank rows = not tested)Taken in supine and seated position  ? ?BED MOBILITY:  ?Sit to supine Mod A ?Supine to sit Performed L SL to sit with max A (PT lowering BLEs and min A for trunk with cues for appropriate use of LUE to elevate trunk to sitting) ?Rolling to Right Min A and Mod A ?Rolling to Left Max A and Total A ? ?TRANSFERS: ?Assistive device utilized:  Stedy   ?Sit to stand: Min A to stedy with pt using BUEs to pull into stand ?Stand to sit: Min A from stedy to elevated mat  ?Chair to chair:  complete dependence with stedy ? ? ? ? ?FUNCTIONAL TESTs:  ?None at this time ? ? ? ?TODAY'S TREATMENT:  ?Evaluated pt, see above for info. Did initiate BLE stretching program.  See HEP below.  ? ? ?PATIENT EDUCATION: ?Education details: Educated on goals, POC, evaluation findings and initial HEP ?Person educated: Patient and sister in law ?Education method: Explanation, Demonstration, Verbal cues, and Handouts ?Education comprehension: verbalized understanding, returned demonstration, and needs further education ? ? ?HOME EXERCISE PROGRAM: ?[email or web address] ? ?ASSESSMENT: ? ?CLINICAL IMPRESSION: ?Pt is a 71 y/o male s/p meningioma resection on 04/29/20 with residual tetraplegia presenting with right side>LLE weakness. Pt's wife assisting with all aspects of care and transfers with use of stedy.  He was discharged from rehab on 5/31. He has been in the hospital 2 x since this incident , one due to a UTI the second was due to a seizure. HIs wife is currently using a steady to assist him  to transfer. Upon PT evaluation, note only trace to no active movement in RLE and marked weakness in LLE as well, but more active movement.  Utilized stedy for sit<>stand at min A level.  Pt relies heavily on UEs to as

## 2021-04-01 NOTE — Therapy (Signed)
[facility] ?[place], [place], [address] ?Phone: [phone]   Fax:  [phone] ? ?Occupational Therapy Evaluation ? ?Patient Details  ?Name: Anthony Downs ?MRN: [id number] ?Date of Birth: Apr 20, 1950 ?Referring Provider (OT): Dr. Naaman Plummer ? ? ?Encounter Date: 04/01/2021 ? ? OT End of Session - 04/01/21 1357   ? ? Visit Number 1   ? Number of Visits 17   ? Date for OT Re-Evaluation 06/15/21   ? Authorization Type Medicare and UHC   ? Authorization Time Period UHC VL - Need medical auth after 30 visits   ? OT Start Time 1230   ? OT Stop Time 1320   ? OT Time Calculation (min) 50 min   ? Activity Tolerance Patient tolerated treatment well   ? Behavior During Therapy [facility] for tasks assessed/performed   ? ?  ?  ? ?  ? ? ?Past Medical History:  ?Diagnosis Date  ? Acid reflux   ? Anxiety   ? Arthritis   ? Asthma   ? as a child  ? Basal cell carcinoma 01/26/2021  ? RIGHT POST SURICULAR INFERIOR  ? Basal cell carcinoma 01/26/2021  ? RIGHT POST AURICULAR SUPERIOR  ? Benign brain tumor ([place])   ? Cancer [facility])   ? 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, squamous behind ear  ? Complication of anesthesia   ? slow to wake up and pain medicines make him sick  ? GERD (gastroesophageal reflux disease)   ? Hypercholesteremia   ? Hypertension   ? PONV (postoperative nausea and vomiting)   ? Sleep apnea   ? no Cpap  ? Subdural hematoma, post-traumatic 2008  ? fell off truck hit head on concrete  ? ? ?Past Surgical History:  ?Procedure Laterality Date  ? APPLICATION OF CRANIAL NAVIGATION Left 04/29/2020  ? Procedure: APPLICATION OF CRANIAL NAVIGATION;  Surgeon: Judith Part, MD;  Location: [place];  Service: Neurosurgery;  Laterality: Left;  ? arthroscopic knee    ? COLONOSCOPY N/A 11/29/2018  ? Procedure: COLONOSCOPY;  Surgeon: Rogene Houston, MD;  [facility];  Service: Endoscopy;  Laterality: N/A;  730-rescheduled 9/2 same time per Lelon Frohlich  ?  CRANIOTOMY Left 04/29/2020  ? Procedure: LEFT CRANIECTOMY WITH TUMOR EXCISION;  Surgeon: Judith Part, MD;  Location: [place];  Service: Neurosurgery;  Laterality: Left;  ? KNEE SURGERY Right   ? NASAL SEPTOPLASTY W/ TURBINOPLASTY Bilateral 03/19/2020  ? Procedure: NASAL SEPTOPLASTY WITH BILATERAL  TURBINATE REDUCTION;  Surgeon: Leta Baptist, MD;  Location: [place];  Service: ENT;  Laterality: Bilateral;  ? VASECTOMY    ? ? ?There were no vitals filed for this visit. ? ? Subjective Assessment - 04/01/21 1355   ? ? Subjective  I want to walk across the floor   ? Patient is accompanied by: Family member   sister in law [place]  ? Currently in Pain? No/denies   ? ?  ?  ? ?  ? ? ? ? [facility] OT Assessment - 04/01/21 0001   ? ?  ? Assessment  ? Medical Diagnosis Menigioma, Tetraplegia   ? Referring Provider (OT) Dr. Naaman Plummer   ? Onset Date/Surgical Date 04/28/20   ? Hand Dominance Right   ? Prior Therapy [facility]   ?  ? Precautions  ? Precautions Fall;Other (comment)   ? Precaution Comments H/O Seizures   ?  ? Balance Screen  ? Has the patient fallen in the past 6  months No   ?  ? Prior Function  ? Level of Independence Needs assistance with ADLs;Needs assistance with transfers   ? Vocation Retired   ? Vocation Requirements Worked as Theme park manager and for [employer]   ? Leisure time with family   ?  ? ADL  ? Eating/Feeding Modified independent   ? Grooming Moderate assistance   ? Upper Body Bathing Minimal assistance   ? Lower Body Bathing Maximal assistance   ? Upper Body Dressing Moderate assistance   ? Lower Body Dressing +1 Total aassistance   ? Toilet Transfer + 1 Total assistance   ? Toileting - Clothing Manipulation + 1 Total assistance   ? Toileting -  Hygiene + 1 Total assistance   ? Tub/Shower Transfer Other (comment)   unable to complete  ? ADL comments Patient has shower seat, BSC, hospital bed, and Stedy Lift.  Patient has caregiver at night currently as wife has had recent breast surgery   ?  ? Written Expression  ?  Dominant Hand Right   ? Handwriting --   unable to maintain grip on pen  ?  ? Vision - History  ? Baseline Vision Wears glasses only for reading   ? Additional Comments Went to the eye doctor recently - little change to prescription   ?  ? Vision Assessment  ? Eye Alignment Within Functional Limits   ? Ocular Range of Motion Within Functional Limits   ? Comment Reports no changes   ?  ? Activity Tolerance  ? Activity Tolerance Comments Patient fatigues quickly with activity   ?  ? Cognition  ? Cognition Comments Patient reports feeling slower with processing   ?  ? Observation/Other Assessments  ? Focus on Therapeutic Outcomes (FOTO)  NA   ?  ? Posture/Postural Control  ? Posture/Postural Control Postural limitations   ? Postural Limitations Increased thoracic kyphosis;Posterior pelvic tilt;Flexed trunk   ? Posture Comments Limited pelvic rotation - able to reach outside BOS R/L.  Able to weight shift slightly forard and backward in unsupported sitting without LOB   ?  ? Sensation  ? Light Touch Appears Intact   ? Stereognosis Not tested   ? Hot/Cold Not tested   ? Proprioception Impaired by gross assessment   ? Additional Comments Less attention to RUE   ?  ? Coordination  ? Gross Motor Movements are Fluid and Coordinated No   ? Fine Motor Movements are Fluid and Coordinated No   ? 9 Hole Peg Test Left;Right   ? Right 9 Hole Peg Test Unable to obtain peg from dish.  Could obtain if upright and place in hole - 3 in one minute   ? Left 9 Hole Peg Test 37.03   ? Box and Blocks R: 4, L:  32   ?  ? Edema  ? Edema dorsum of right hand   ?  ? Tone  ? Assessment Location Right Upper Extremity   ?  ? ROM / Strength  ? AROM / PROM / Strength AROM;PROM;Strength   ?  ? AROM  ? Overall AROM  Deficits   ? AROM Assessment Site Shoulder;Elbow;Forearm;Wrist   ? Right/Left Shoulder Right;Left   ? Right Shoulder Flexion 65 Degrees   ? Right Shoulder ABduction 70 Degrees   ? Right Shoulder External Rotation 30 Degrees   ? Left  Shoulder Flexion 110 Degrees   ? Left Shoulder ABduction 110 Degrees   ? Right/Left Elbow Right   ? Right  Elbow Flexion 75   ? Right Elbow Extension 150   ? Right/Left Forearm Right   ? Right Forearm Supination 20 Degrees   ? Right/Left Wrist Right   ? Right Wrist Extension 10 Degrees   ? Right Wrist Flexion 15 Degrees   ?  ? PROM  ? Overall PROM  Deficits   ? PROM Assessment Site Shoulder;Elbow   ? Right/Left Shoulder Right;Left   ? Right Shoulder Flexion 100 Degrees   ? Right Shoulder ABduction 100 Degrees   ? Left Shoulder Flexion 125 Degrees   ? Left Shoulder ABduction 120 Degrees   ? Right/Left Elbow Right   ? Right Elbow Flexion 45   ? Right Elbow Extension 150   ?  ? Strength  ? Overall Strength Deficits   ? Overall Strength Comments LUE   ? Strength Assessment Site Shoulder;Elbow   ? Right/Left Shoulder Left   ? Left Shoulder Flexion 4-/5   ? Right/Left Elbow Left   ? Left Elbow Extension 4-/5   ?  ? Hand Function  ? Right Hand Gross Grasp Impaired   ? Right Hand Grip (lbs) 6.1   ? Right Hand Lateral Pinch 2 lbs   ? Left Hand Gross Grasp Functional   ? Left Hand Grip (lbs) 65.2   ? Left Hand Lateral Pinch 18 lbs   ?  ? RUE Tone  ? RUE Tone Moderate;Hypertonic   ?  ? LUE Tone  ? LUE Tone Mild;Hypertonic   ? ?  ?  ? ?  ? ? ? ? ? ? ? ? ? ? ? ? ? ? ? ? ? ? ? OT Education - 04/01/21 1354   ? ? Education Details Results of eval and potential goals   ? Person(s) Educated Associate Professor)   sister in law [place]  ? Methods Explanation   ? Comprehension Verbalized understanding;Need further instruction   ? ?  ?  ? ?  ? ? ? OT Short Term Goals - 04/01/21 1645   ? ?  ? OT SHORT TERM GOAL #1  ? Title Patient and caregiver will complete HEP designed to improve passive and active range of motion in BUE   ? Time 4   ? Period Weeks   ? Status New   ? Target Date 05/16/21   ?  ? OT SHORT TERM GOAL #2  ? Title Patient will utilize RUE to assist with at least one ADL task with mod cueing and minimal assistance   ? Time 4    ? Period Weeks   ? Status New   ?  ? OT SHORT TERM GOAL #3  ? Title Patient will reach at least mid calf toward feet with BUE in preparation for participating in LB dressing   ? Time 4   ? Period Weeks   ? Status New

## 2021-04-07 ENCOUNTER — Other Ambulatory Visit: Payer: Self-pay

## 2021-04-07 ENCOUNTER — Ambulatory Visit: Payer: Medicare Other | Admitting: Physical Therapy

## 2021-04-07 ENCOUNTER — Ambulatory Visit: Payer: Medicare Other | Admitting: Occupational Therapy

## 2021-04-07 ENCOUNTER — Encounter: Payer: Self-pay | Admitting: Occupational Therapy

## 2021-04-07 DIAGNOSIS — M6281 Muscle weakness (generalized): Secondary | ICD-10-CM | POA: Diagnosis not present

## 2021-04-07 DIAGNOSIS — M25611 Stiffness of right shoulder, not elsewhere classified: Secondary | ICD-10-CM

## 2021-04-07 DIAGNOSIS — R2689 Other abnormalities of gait and mobility: Secondary | ICD-10-CM

## 2021-04-07 DIAGNOSIS — R208 Other disturbances of skin sensation: Secondary | ICD-10-CM

## 2021-04-07 DIAGNOSIS — R293 Abnormal posture: Secondary | ICD-10-CM

## 2021-04-07 DIAGNOSIS — M25621 Stiffness of right elbow, not elsewhere classified: Secondary | ICD-10-CM

## 2021-04-07 DIAGNOSIS — R278 Other lack of coordination: Secondary | ICD-10-CM

## 2021-04-07 NOTE — Therapy (Signed)
?OUTPATIENT PHYSICAL THERAPY TREATMENT NOTE ? ? ?Patient Name: Anthony Downs ?MRN: [id number] ?DOB:1950-04-25, 71 y.o., male ?Today's Date: 04/07/2021 ? ?PCP: Redmond School, MD ?REFERRING PROVIDER: [name], MD  ? ? PT End of Session - 04/07/21 1614   ? ? Visit Number 2   ? Number of Visits 17   ? Date for PT Re-Evaluation 05/31/21   ? Authorization Type Medicare and UHC (needs 10th visit progress note)   ? Progress Note Due on Visit 10   ? PT Start Time 0160   ? PT Stop Time 1093   ? PT Time Calculation (min) 45 min   ? Equipment Utilized During Treatment --   stedy for transfers  ? Activity Tolerance Patient limited by fatigue   ? Behavior During Therapy [facility] for tasks assessed/performed   ? ?  ?  ? ?  ? ? ?Past Medical History:  ?Diagnosis Date  ? Acid reflux   ? Anxiety   ? Arthritis   ? Asthma   ? as a child  ? Basal cell carcinoma 01/26/2021  ? RIGHT POST SURICULAR INFERIOR  ? Basal cell carcinoma 01/26/2021  ? RIGHT POST AURICULAR SUPERIOR  ? Benign brain tumor ([place])   ? Cancer [facility])   ? 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, squamous behind ear  ? Complication of anesthesia   ? slow to wake up and pain medicines make him sick  ? GERD (gastroesophageal reflux disease)   ? Hypercholesteremia   ? Hypertension   ? PONV (postoperative nausea and vomiting)   ? Sleep apnea   ? no Cpap  ? Subdural hematoma, post-traumatic 2008  ? fell off truck hit head on concrete  ? ?Past Surgical History:  ?Procedure Laterality Date  ? APPLICATION OF CRANIAL NAVIGATION Left 04/29/2020  ? Procedure: APPLICATION OF CRANIAL NAVIGATION;  Surgeon: Judith Part, MD;  Location: [place];  Service: Neurosurgery;  Laterality: Left;  ? arthroscopic knee    ? COLONOSCOPY N/A 11/29/2018  ? Procedure: COLONOSCOPY;  Surgeon: Rogene Houston, MD;  [facility];  Service: Endoscopy;  Laterality: N/A;  730-rescheduled 9/2 same time per Lelon Frohlich  ? CRANIOTOMY Left 04/29/2020  ? Procedure: LEFT CRANIECTOMY WITH TUMOR EXCISION;   Surgeon: Judith Part, MD;  Location: [place];  Service: Neurosurgery;  Laterality: Left;  ? KNEE SURGERY Right   ? NASAL SEPTOPLASTY W/ TURBINOPLASTY Bilateral 03/19/2020  ? Procedure: NASAL SEPTOPLASTY WITH BILATERAL  TURBINATE REDUCTION;  Surgeon: Leta Baptist, MD;  Location: [place];  Service: ENT;  Laterality: Bilateral;  ? VASECTOMY    ? ?Patient Active Problem List  ? Diagnosis Date Noted  ? Seizure ([place]) 08/13/2020  ? Seizures ([place]) 08/12/2020  ? 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 DVT (deep venous thrombosis) ([place]) 08/12/2020  ? COVID-19 virus infection 08/12/2020  ? Acute lower UTI 07/14/2020  ? Delirium 07/06/2020  ? Sleep apnea 07/06/2020  ? GERD (gastroesophageal reflux disease) 07/06/2020  ? 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[place]) 06/25/2020  ? Sundowning 06/25/2020  ? Slow transit constipation   ? Essential hypertension   ? Hypokalemia   ? Postoperative pain   ? Meningioma ([place]) 05/02/2020  ? Brain tumor ([place]) 04/28/2020  ? S/P nasal septoplasty 03/19/2020  ? Special screening for malignant neoplasms, colon 01/09/2018  ? ? ?REFERRING DIAG: D32.9 (ICD-10-CM) - Meningioma ([place]) G82.50 (ICD-10-CM) - 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[place]) R56.9 (ICD-10-CM) - Seizures ([place])   ? ?THERAPY DIAG:  ?Muscle weakness (generalized) ? ?Abnormal posture ? ?Other abnormalities of  gait and mobility ? ?PERTINENT HISTORY: anxiety, OA, cancer, subdural hematoma  ?  ? ?PRECAUTIONS: Fall ? ?SUBJECTIVE: "I am ready to get in the chair". Pt reports being extremely fatigued following OT session.  ? ?PAIN:  ?Are you having pain? No ? ? ?OBJECTIVE:  ?  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 There are mild chronic small-vessel ischemic ?changes of the white matter. There is atrophy, encephalomalacia and ?gliosis along the medial aspects of both sides of the brain at the ?frontoparietal vertex, sequela of the previous tumor and subsequent ?resection. It appears that  the midportion of the superior sagittal ?sinus has been resected. No evidence of tumor in the venous sinuses ?anterior or posterior to that. ?  ?  ?MUSCLE LENGTH: ?Hamstrings: Right approx 5-10 deg from full ext, Left marked lag, approx 20-30 deg from extension  ?  ? ?POSTURE: rounded shoulders, forward head, increased thoracic kyphosis, posterior pelvic tilt, and flexed trunk  ?  ?LE ROM:   Will formally assess at future visit. ?  ?Passive  Right ?04/01/2021 Left ?04/01/2021  ?Hip flexion      ?Hip extension      ?Hip abduction      ?Hip adduction      ?Hip internal rotation      ?Hip external rotation      ?Knee flexion      ?Knee extension      ?Ankle dorsiflexion      ?Ankle plantarflexion      ?Ankle inversion      ?Ankle eversion      ? (Blank rows = not tested) ?  ?  ?  ?TODAY'S TREATMENT:  ?NMR ?Pt received seated on edge of low mat, handoff w/OT. Pt very fatigued but agreeable to PT. Pt performed sit <>stand from mat to [facility] w/mod A due to fatigue, max tactile cues to anterior shoulder and glutes to facilitate trunk extension and min verbal cues for anterior weight shift. Once in stedy, pt performed 4 sit <>stands inside stedy w/BUE support w/emphasis on maintaining trunk extension and holding stand as long as possible, min A throughout. Mod verbal cues to place hands on bar closer to pt to reduce forward lean w/stand, but pt reported inability to change hand position due to fatigue. Pt able to hold stand for 5-15s at a time, lengthy rest breaks taken in between sets.  ? ?Stand <>sit from [facility] to mat w/max A for eccentric control and educated pt and wife on practicing stands in [place] at home for improved BLE strength and weightbearing tolerance, pt and wife verbalized understanding. Attempted to have pt scoot on edge of mat to L side, but pt unable to scoot. Provided max visual and verbal cues for lateral weight shift towards R side to scoot to L side, but pt too fatigued to try.  ? ?Sit <>stand from mat to  [facility] w/max A x2 due to extreme fatigue. Pt transported to chair via Stedy and stand <>sit w/max A x2 for safety. Posterior scoot into chair w/max A x2.  ?  ?  ?PATIENT EDUCATION: ?Education details: Sit <>stands at home with [facility], importance of lateral weight shifting for transfers  ?Person educated: Patient and wife ?Education method: Explanation, Demonstration, Verbal cues, and Handouts ?Education comprehension: verbalized understanding, returned demonstration, and needs further education ?  ?  ?HOME EXERCISE PROGRAM: ?[email or web address] ?  ?ASSESSMENT: ?  ?CLINICAL IMPRESSION: ?Emphasis of  skilled PT session on BLE strength and sit <>stand transfers. Pt required mod-max A for transfers today due to fatigue. Pt able to place L foot into Stedy on his own. Pt continues to demonstrate heavy forward lean in standing and provided max multimodal cues to facilitate extensor strength for upright posture. Will continue to progress transfers and standing tolerance for improved independence and functional mobility.  ?  ?  ?OBJECTIVE IMPAIRMENTS decreased activity tolerance, decreased balance, decreased coordination, decreased endurance, decreased knowledge of use of DME, decreased mobility, difficulty walking, decreased ROM, decreased strength, hypomobility, impaired perceived functional ability, impaired flexibility, impaired UE functional use, and postural dysfunction.  ?  ?ACTIVITY LIMITATIONS cleaning, community activity, meal prep, laundry, and medication management.  ?  ?PERSONAL FACTORS Time since onset of injury/illness/exacerbation and 3+ comorbidities: anxiety, OA, cancer, subdural hematoma  are also affecting patient's functional outcome.  ?  ?  ?REHAB POTENTIAL: Fair due to amount of time since onset and previous progress with PT ?  ?CLINICAL DECISION MAKING: Evolving/moderate complexity ?  ?EVALUATION COMPLEXITY: Moderate ?  ?  ?GOALS: ?Goals  reviewed with patient? Yes ?  ?SHORT TERM GOALS: Target date: 04/29/2021 ?  ?Pt/caregiver will be IND with HEP in order to indicate improved functional mobility and improved flexibility. ?Baseline:  ?G

## 2021-04-07 NOTE — Therapy (Signed)
[facility] [address] [place], [place], [address] Phone: [phone]   Fax:  [phone]  Occupational Therapy Treatment  Patient Details  Name: Anthony Downs MRN: [id number] Date of Birth: 18-May-1950 Referring Provider (OT): Dr. Riley Kill   Encounter Date: 04/07/2021   OT End of Session - 04/07/21 1357     Visit Number 2    Number of Visits 17    Date for OT Re-Evaluation 06/15/21    Authorization Type Medicare and UHC    Authorization Time Period UHC VL - Need medical auth after 30 visits    OT Start Time 1356    OT Stop Time 1438    OT Time Calculation (min) 42 min    Activity Tolerance Patient tolerated treatment well    Behavior During Therapy [facility] for tasks assessed/performed             Past Medical History:  Diagnosis Date   Acid reflux    Anxiety    Arthritis    Asthma    as a child   Basal cell carcinoma 01/26/2021   RIGHT POST SURICULAR INFERIOR   Basal cell carcinoma 01/26/2021   RIGHT POST AURICULAR SUPERIOR   Benign brain tumor ([facility])    Cancer ([facility])    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, squamous behind ear   Complication of anesthesia    slow to wake up and pain medicines make him sick   GERD (gastroesophageal reflux disease)    Hypercholesteremia    Hypertension    PONV (postoperative nausea and vomiting)    Sleep apnea    no Cpap   Subdural hematoma, post-traumatic 2008   fell off truck hit head on concrete    Past Surgical History:  Procedure Laterality Date   APPLICATION OF CRANIAL NAVIGATION Left 04/29/2020   Procedure: APPLICATION OF CRANIAL NAVIGATION;  Surgeon: Jadene Pierini, MD;  [facility];  Service: Neurosurgery;  Laterality: Left;   arthroscopic knee     COLONOSCOPY N/A 11/29/2018   Procedure: COLONOSCOPY;  Surgeon: Malissa Hippo, MD;  [facility];  Service: Endoscopy;  Laterality: N/A;  730-rescheduled 9/2 same time per Ann    CRANIOTOMY Left 04/29/2020   Procedure: LEFT CRANIECTOMY WITH TUMOR EXCISION;  Surgeon: Jadene Pierini, MD;  Location: [facility] OR;  Service: Neurosurgery;  Laterality: Left;   KNEE SURGERY Right    NASAL SEPTOPLASTY W/ TURBINOPLASTY Bilateral 03/19/2020   Procedure: NASAL SEPTOPLASTY WITH BILATERAL  TURBINATE REDUCTION;  Surgeon: Newman Pies, MD;  [facility];  Service: ENT;  Laterality: Bilateral;   VASECTOMY      There were no vitals filed for this visit.   Subjective Assessment - 04/07/21 1356     Subjective  Pt is here with his spouse. "I never have any pain"    Patient is accompanied by: Family member   spouse   Currently in Pain? No/denies             Pt's spouse is present for therapy session today.   Pt and spouse were instructed on PROM and AAROM for shoulder flexion and RUE elbow extension, wrist extension and flexion and composite flexion and extnesion. Pt with significant spasticity/tone in RUE and limited ROM in RUE. Pt able to achieve only approx 50% of composite flexion PROM.   Transfer with Steady to mat from power chair with min A for standing assistance and total assistance for managing device.   Pt sat  edge of mat with no assistance or LOB although pt with poor activity tolerance and significant fatigue after transfer and with sitting edge of mat. Pt worked on lateral flexion, reaching to floor and weight bearing into BUE forearms for increasing and progressing independence and ability to complete transfers and LB Dressing.                       OT Short Term Goals - 04/01/21 1645       OT SHORT TERM GOAL #1   Title Patient and caregiver will complete HEP designed to improve passive and active range of motion in BUE    Time 4    Period Weeks    Status New    Target Date 05/16/21      OT SHORT TERM GOAL #2   Title Patient will utilize RUE to assist with at least one ADL task with mod cueing and minimal assistance    Time 4    Period  Weeks    Status New      OT SHORT TERM GOAL #3   Title Patient will reach at least mid calf toward feet with BUE in preparation for participating in LB dressing    Time 4    Period Weeks    Status New      OT SHORT TERM GOAL #4   Title Patient will demonstrate sufficient lateral lean left or right in seated position to prepare for placement of sliding board to encourage more active transfers and increase potential for commode transfers.    Time 4    Period Weeks    Status New      OT SHORT TERM GOAL #5   Title Pt will be educated on available AE and DME to assist with ADL completion as needed for improved independence.    Time 4    Status New               OT Long Term Goals - 04/01/21 1652       OT LONG TERM GOAL #1   Title Patient will complete updated HEP designed to improve range, strength, and functional use of BUE    Time 8    Period Weeks    Status New    Target Date 06/15/21      OT LONG TERM GOAL #2   Title Patient will complete an active transfer level surface with slide board toward right with max assistance in preparation for commode transfer    Time 8    Period Weeks    Status New      OT LONG TERM GOAL #3   Title Patient will demonstrate improved coordination in RUE as evidenced by at least 8 blocks (box in blocks) in one minute    Baseline 4    Time 8    Period Weeks    Status New      OT LONG TERM GOAL #4   Title Patient will demonstrate a 4lb increase in grip strength in RUE    Baseline 6.1    Time 8    Period Weeks    Status New      OT LONG TERM GOAL #5   Title Per patient and wife, patient will complete at least 30% more of bathing and dressing tasks    Time 8    Period Weeks    Status New  Plan - 04/07/21 1635     Clinical Impression Statement pt and spouse verbalized understanding of goals. pt with godo trunk stability with sitting edge of mat and working towards increasing independence and  participation in [place].    OT Occupational Profile and History Detailed Assessment- Review of Records and additional review of physical, cognitive, psychosocial history related to current functional performance    Occupational performance deficits (Please refer to evaluation for details): ADL's;IADL's;Leisure    Body Structure / Function / Physical Skills ADL;Decreased knowledge of use of DME;Gait;Obesity;Strength;Tone;GMC;Dexterity;Balance;Body mechanics;Edema;Proprioception;UE functional use;Cardiopulmonary status limiting activity;Endurance;IADL;ROM;Continence;Coordination;Flexibility;Mobility;Sensation;FMC;Decreased knowledge of precautions;Muscle spasms    Rehab Potential Good    Clinical Decision Making Several treatment options, min-mod task modification necessary    Comorbidities Affecting Occupational Performance: May have comorbidities impacting occupational performance    Modification or Assistance to Complete Evaluation  Min-Moderate modification of tasks or assist with assess necessary to complete eval    OT Frequency 2x / week    OT Duration 8 weeks    OT Treatment/Interventions Self-care/ADL training;Aquatic Therapy;DME and/or AE instruction;Splinting;Balance training;Therapeutic activities;Therapeutic exercise;Cognitive remediation/compensation;Passive range of motion;Functional Mobility Training;Neuromuscular education;Electrical Stimulation;Energy conservation;Patient/family education;Manual Therapy    Plan continue progressing home stretching program, dynamic sitting balance, increasing independence toward ADLs.    Consulted and Agree with Plan of Care Patient;Family member/caregiver    Family Member Consulted sister in law [place]             Patient will benefit from skilled therapeutic intervention in order to improve the following deficits and impairments:   Body Structure / Function / Physical Skills: ADL, Decreased knowledge of use of DME, Gait, Obesity, Strength, Tone,  GMC, Dexterity, Balance, Body mechanics, Edema, Proprioception, UE functional use, Cardiopulmonary status limiting activity, Endurance, IADL, ROM, Continence, Coordination, Flexibility, Mobility, Sensation, FMC, Decreased knowledge of precautions, Muscle spasms       Visit Diagnosis: Muscle weakness (generalized)  Other lack of coordination  Stiffness of right shoulder, not elsewhere classified  Stiffness of right elbow, not elsewhere classified  Other disturbances of skin sensation  Other abnormalities of gait and mobility    Problem List Patient Active Problem List   Diagnosis Date Noted   Seizure ([facility]) 08/13/2020   Seizures ([facility]) 08/12/2020   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   DVT (deep venous thrombosis) ([facility]) 08/12/2020   COVID-19 virus infection 08/12/2020   Acute lower UTI 07/14/2020   Delirium 07/06/2020   Sleep apnea 07/06/2020   GERD (gastroesophageal reflux disease) 07/06/2020   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[facility]) 06/25/2020   Sundowning 06/25/2020   Slow transit constipation    Essential hypertension    Hypokalemia    Postoperative pain    Meningioma ([facility]) 05/02/2020   Brain tumor ([facility]) 04/28/2020   S/P nasal septoplasty 03/19/2020   Special screening for malignant neoplasms, colon 01/09/2018    Junious Dresser, OT 04/07/2021, 4:36 PM  [facility] [address] [place], [place], [address] Phone: [phone]   Fax:  [phone]  Name: SKYLAND FEIST MRN: [id number] Date of Birth: Nov 19, 1950

## 2021-04-09 ENCOUNTER — Other Ambulatory Visit: Payer: Self-pay

## 2021-04-09 ENCOUNTER — Ambulatory Visit: Payer: Medicare Other | Admitting: Occupational Therapy

## 2021-04-09 ENCOUNTER — Ambulatory Visit: Payer: Medicare Other | Admitting: Physical Therapy

## 2021-04-09 ENCOUNTER — Encounter: Payer: Self-pay | Admitting: Occupational Therapy

## 2021-04-09 DIAGNOSIS — M6281 Muscle weakness (generalized): Secondary | ICD-10-CM

## 2021-04-09 DIAGNOSIS — R293 Abnormal posture: Secondary | ICD-10-CM

## 2021-04-09 DIAGNOSIS — M25611 Stiffness of right shoulder, not elsewhere classified: Secondary | ICD-10-CM

## 2021-04-09 DIAGNOSIS — R2689 Other abnormalities of gait and mobility: Secondary | ICD-10-CM

## 2021-04-09 DIAGNOSIS — M25621 Stiffness of right elbow, not elsewhere classified: Secondary | ICD-10-CM

## 2021-04-09 DIAGNOSIS — R208 Other disturbances of skin sensation: Secondary | ICD-10-CM

## 2021-04-09 DIAGNOSIS — R278 Other lack of coordination: Secondary | ICD-10-CM

## 2021-04-09 NOTE — Therapy (Signed)
[facility], [place], [address] ?Phone: [phone]   Fax:  [phone] ? ?Occupational Therapy Treatment ? ?Patient Details  ?Name: Anthony Downs ?MRN: [id number] ?Date of Birth: Mar 14, 1950 ?Referring Provider (OT): Dr. Naaman Plummer ? ? ?Encounter Date: 04/09/2021 ? ? OT End of Session - 04/09/21 1534   ? ? Visit Number 3   ? Number of Visits 17   ? Date for OT Re-Evaluation 06/15/21   ? Authorization Type Medicare and UHC   ? Authorization Time Period UHC VL - Need medical auth after 30 visits   ? OT Start Time 1532   ? OT Stop Time 1615   ? OT Time Calculation (min) 43 min   ? Activity Tolerance Patient tolerated treatment well   ? Behavior During Therapy [facility] for tasks assessed/performed   ? ?  ?  ? ?  ? ? ?Past Medical History:  ?Diagnosis Date  ? Acid reflux   ? Anxiety   ? Arthritis   ? Asthma   ? as a child  ? Basal cell carcinoma 01/26/2021  ? RIGHT POST SURICULAR INFERIOR  ? Basal cell carcinoma 01/26/2021  ? RIGHT POST AURICULAR SUPERIOR  ? Benign brain tumor ([place])   ? Cancer [facility])   ? 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, squamous behind ear  ? Complication of anesthesia   ? slow to wake up and pain medicines make him sick  ? GERD (gastroesophageal reflux disease)   ? Hypercholesteremia   ? Hypertension   ? PONV (postoperative nausea and vomiting)   ? Sleep apnea   ? no Cpap  ? Subdural hematoma, post-traumatic 2008  ? fell off truck hit head on concrete  ? ? ?Past Surgical History:  ?Procedure Laterality Date  ? APPLICATION OF CRANIAL NAVIGATION Left 04/29/2020  ? Procedure: APPLICATION OF CRANIAL NAVIGATION;  Surgeon: Judith Part, MD;  Location: [place];  Service: Neurosurgery;  Laterality: Left;  ? arthroscopic knee    ? COLONOSCOPY N/A 11/29/2018  ? Procedure: COLONOSCOPY;  Surgeon: Rogene Houston, MD;  [facility];  Service: Endoscopy;  Laterality: N/A;  730-rescheduled 9/2 same time per Lelon Frohlich  ?  CRANIOTOMY Left 04/29/2020  ? Procedure: LEFT CRANIECTOMY WITH TUMOR EXCISION;  Surgeon: Judith Part, MD;  Location: [place];  Service: Neurosurgery;  Laterality: Left;  ? KNEE SURGERY Right   ? NASAL SEPTOPLASTY W/ TURBINOPLASTY Bilateral 03/19/2020  ? Procedure: NASAL SEPTOPLASTY WITH BILATERAL  TURBINATE REDUCTION;  Surgeon: Leta Baptist, MD;  Location: [place];  Service: ENT;  Laterality: Bilateral;  ? VASECTOMY    ? ? ?There were no vitals filed for this visit. ? ? Subjective Assessment - 04/09/21 1536   ? ? Subjective  "it felt good to stand up"   ? Patient is accompanied by: Family member   ? Currently in Pain? No/denies   ? ?  ?  ? ?  ? ? ? ? ?Transfers: sliding board transfer to mat from power chair with total assistance. Pt able to initiate a couple of scoots to left but mostly total assistance for transfer. Use of STEADY back to power chair from edge of mat with max A for lifting to steady and total assistance for management of device.  ? ?Hemi Glide with RUE for increased forward reaching and slight horizontal abduction. Pt with good control but unable to maintaining full grasp on pole. Pt with significant tightness limiting range of motion and  report of increased pain at shoulder.  ? ?Lateral Lean bilaterally to elevated surface for weight bearing slightly on forearm and pushing up through palm x 5 each side. Pt with mod assistance and encouragement for leaning to right.  ? ?Simulated reaching to shins and feet for increased independence with bathing. Pt with limited range of motion from sitting position and able to reach to shins but not all the way to feet.  ? ? ? ? ? ? ? ? ? ? ? ? ? ? ? ? ? ? ? ? ? ? ? ? ? OT Short Term Goals - 04/01/21 1645   ? ?  ? OT SHORT TERM GOAL #1  ? Title Patient and caregiver will complete HEP designed to improve passive and active range of motion in BUE   ? Time 4   ? Period Weeks   ? Status New   ? Target Date 05/16/21   ?  ? OT SHORT TERM GOAL #2  ? Title Patient will  utilize RUE to assist with at least one ADL task with mod cueing and minimal assistance   ? Time 4   ? Period Weeks   ? Status New   ?  ? OT SHORT TERM GOAL #3  ? Title Patient will reach at least mid calf toward feet with BUE in preparation for participating in LB dressing   ? Time 4   ? Period Weeks   ? Status New   ?  ? OT SHORT TERM GOAL #4  ? Title Patient will demonstrate sufficient lateral lean left or right in seated position to prepare for placement of sliding board to encourage more active transfers and increase potential for commode transfers.   ? Time 4   ? Period Weeks   ? Status New   ?  ? OT SHORT TERM GOAL #5  ? Title Pt will be educated on available AE and DME to assist with ADL completion as needed for improved independence.   ? Time 4   ? Status New   ? ?  ?  ? ?  ? ? ? ? OT Long Term Goals - 04/01/21 1652   ? ?  ? OT LONG TERM GOAL #1  ? Title Patient will complete updated HEP designed to improve range, strength, and functional use of BUE   ? Time 8   ? Period Weeks   ? Status New   ? Target Date 06/15/21   ?  ? OT LONG TERM GOAL #2  ? Title Patient will complete an active transfer level surface with slide board toward right with max assistance in preparation for commode transfer   ? Time 8   ? Period Weeks   ? Status New   ?  ? OT LONG TERM GOAL #3  ? Title Patient will demonstrate improved coordination in RUE as evidenced by at least 8 blocks (box in blocks) in one minute   ? Baseline 4   ? Time 8   ? Period Weeks   ? Status New   ?  ? OT LONG TERM GOAL #4  ? Title Patient will demonstrate a 4lb increase in grip strength in RUE   ? Baseline 6.1   ? Time 8   ? Period Weeks   ? Status New   ?  ? OT LONG TERM GOAL #5  ? Title Per patient and wife, patient will complete at least 30% more of bathing and dressing tasks   ?  Time 8   ? Period Weeks   ? Status New   ? ?  ?  ? ?  ? ? ? ? ? ? ? ? Plan - 04/09/21 1624   ? ? Clinical Impression Statement pt with increased pain in RUE shoulder today with  range of motion. Pt continues to progress and demonstrates good trunk control for increased balance with ADLs.   ? OT Occupational Profile and History Detailed Assessment- Review of Records and additional review of physical, cognitive, psychosocial history related to current functional performance   ? Occupational performance deficits (Please refer to evaluation for details): ADL's;IADL's;Leisure   ? Body Structure / Function / Physical Skills ADL;Decreased knowledge of use of DME;Gait;Obesity;Strength;Tone;GMC;Dexterity;Balance;Body mechanics;Edema;Proprioception;UE functional use;Cardiopulmonary status limiting activity;Endurance;IADL;ROM;Continence;Coordination;Flexibility;Mobility;Sensation;FMC;Decreased knowledge of precautions;Muscle spasms   ? Rehab Potential Good   ? Clinical Decision Making Several treatment options, min-mod task modification necessary   ? Comorbidities Affecting Occupational Performance: May have comorbidities impacting occupational performance   ? Modification or Assistance to Complete Evaluation  Min-Moderate modification of tasks or assist with assess necessary to complete eval   ? OT Frequency 2x / week   ? OT Duration 8 weeks   ? OT Treatment/Interventions Self-care/ADL training;Aquatic Therapy;DME and/or AE instruction;Splinting;Balance training;Therapeutic activities;Therapeutic exercise;Cognitive remediation/compensation;Passive range of motion;Functional Mobility Training;Neuromuscular education;Electrical Stimulation;Energy conservation;Patient/family education;Manual Therapy   ? Plan continue progressing home stretching program, dynamic sitting balance, increasing independence toward ADLs.   ? Consulted and Agree with Plan of Care Patient;Family member/caregiver   ? Family Member Consulted sister in law [place]   ? ?  ?  ? ?  ? ? ?Patient will benefit from skilled therapeutic intervention in order to improve the following deficits and impairments:   ?Body Structure / Function /  Physical Skills: ADL, Decreased knowledge of use of DME, Gait, Obesity, Strength, Tone, GMC, Dexterity, Balance, Body mechanics, Edema, Proprioception, UE functional use, Cardiopulmonary status limiting activity, E

## 2021-04-09 NOTE — Therapy (Signed)
?OUTPATIENT PHYSICAL THERAPY TREATMENT NOTE ? ? ?Patient Name: Anthony Downs ?MRN: [id number] ?DOB:1950/08/08, 71 y.o., male ?Today's Date: 04/09/2021 ? ?PCP: Redmond School, MD ?REFERRING PROVIDER: [name], MD  ? ? PT End of Session - 04/09/21 1449   ? ? Visit Number 3   ? Number of Visits 17   ? Date for PT Re-Evaluation 05/31/21   ? Authorization Type Medicare and UHC (needs 10th visit progress note)   ? Progress Note Due on Visit 10   ? PT Start Time 6213   ? PT Stop Time 1530   ? PT Time Calculation (min) 43 min   ? Equipment Utilized During Treatment Other (comment)   stedy for transfers  ? Activity Tolerance Patient limited by fatigue   ? Behavior During Therapy [facility] for tasks assessed/performed   ? ?  ?  ? ?  ? ? ? ?Past Medical History:  ?Diagnosis Date  ? Acid reflux   ? Anxiety   ? Arthritis   ? Asthma   ? as a child  ? Basal cell carcinoma 01/26/2021  ? RIGHT POST SURICULAR INFERIOR  ? Basal cell carcinoma 01/26/2021  ? RIGHT POST AURICULAR SUPERIOR  ? Benign brain tumor ([place])   ? Cancer [facility])   ? 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, squamous behind ear  ? Complication of anesthesia   ? slow to wake up and pain medicines make him sick  ? GERD (gastroesophageal reflux disease)   ? Hypercholesteremia   ? Hypertension   ? PONV (postoperative nausea and vomiting)   ? Sleep apnea   ? no Cpap  ? Subdural hematoma, post-traumatic 2008  ? fell off truck hit head on concrete  ? ?Past Surgical History:  ?Procedure Laterality Date  ? APPLICATION OF CRANIAL NAVIGATION Left 04/29/2020  ? Procedure: APPLICATION OF CRANIAL NAVIGATION;  Surgeon: Judith Part, MD;  Location: [place];  Service: Neurosurgery;  Laterality: Left;  ? arthroscopic knee    ? COLONOSCOPY N/A 11/29/2018  ? Procedure: COLONOSCOPY;  Surgeon: Rogene Houston, MD;  [facility];  Service: Endoscopy;  Laterality: N/A;  730-rescheduled 9/2 same time per Lelon Frohlich  ? CRANIOTOMY Left 04/29/2020  ? Procedure: LEFT CRANIECTOMY WITH  TUMOR EXCISION;  Surgeon: Judith Part, MD;  Location: [place];  Service: Neurosurgery;  Laterality: Left;  ? KNEE SURGERY Right   ? NASAL SEPTOPLASTY W/ TURBINOPLASTY Bilateral 03/19/2020  ? Procedure: NASAL SEPTOPLASTY WITH BILATERAL  TURBINATE REDUCTION;  Surgeon: Leta Baptist, MD;  Location: [place];  Service: ENT;  Laterality: Bilateral;  ? VASECTOMY    ? ?Patient Active Problem List  ? Diagnosis Date Noted  ? Seizure ([place]) 08/13/2020  ? Seizures ([place]) 08/12/2020  ? 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 DVT (deep venous thrombosis) ([place]) 08/12/2020  ? COVID-19 virus infection 08/12/2020  ? Acute lower UTI 07/14/2020  ? Delirium 07/06/2020  ? Sleep apnea 07/06/2020  ? GERD (gastroesophageal reflux disease) 07/06/2020  ? 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[place]) 06/25/2020  ? Sundowning 06/25/2020  ? Slow transit constipation   ? Essential hypertension   ? Hypokalemia   ? Postoperative pain   ? Meningioma ([place]) 05/02/2020  ? Brain tumor ([place]) 04/28/2020  ? S/P nasal septoplasty 03/19/2020  ? Special screening for malignant neoplasms, colon 01/09/2018  ? ? ?REFERRING DIAG: D32.9 (ICD-10-CM) - Meningioma ([place]) G82.50 (ICD-10-CM) - 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[place]) R56.9 (ICD-10-CM) - Seizures ([place])   ? ?THERAPY DIAG:  ?Muscle weakness (generalized) ? ?Other abnormalities of gait  and mobility ? ?Abnormal posture ? ?PERTINENT HISTORY: anxiety, OA, cancer, subdural hematoma  ?  ? ?PRECAUTIONS: Fall ? ?SUBJECTIVE: "I will be walking this summer". Reports he did not get to work on HEP much due to having company over but did stand 6 times in [place]. No new changes.  ? ?PAIN:  ?Are you having pain? No ? ? ?OBJECTIVE:  ?  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 There are mild chronic small-vessel ischemic ?changes of the white matter. There is atrophy, encephalomalacia and ?gliosis along the medial aspects of both sides of the brain at the ?frontoparietal  vertex, sequela of the previous tumor and subsequent ?resection. It appears that the midportion of the superior sagittal ?sinus has been resected. No evidence of tumor in the venous sinuses ?anterior or posterior to that. ?  ?  ?MUSCLE LENGTH: ?Hamstrings: Right approx 5-10 deg from full ext, Left marked lag, approx 20-30 deg from extension  ?  ? ?POSTURE: rounded shoulders, forward head, increased thoracic kyphosis, posterior pelvic tilt, and flexed trunk  ?  ?LE ROM:   Will formally assess at future visit. ?  ?Passive  Right ?04/01/2021 Left ?04/01/2021  ?Hip flexion      ?Hip extension      ?Hip abduction      ?Hip adduction      ?Hip internal rotation      ?Hip external rotation      ?Knee flexion      ?Knee extension      ?Ankle dorsiflexion      ?Ankle plantarflexion      ?Ankle inversion      ?Ankle eversion      ? (Blank rows = not tested) ?  ?  ?  ?TODAY'S TREATMENT:  ?NMR ?In // bars, pt performed 4 sit <>stands for improved transfers, glute strength and weightbearing tolerance. All performed in front of mirror to provide biofeedback of body position. Provided seated hamstring stretch for 45s per side in between each stand. Stand <>sits w/min-mod A due to poor eccentric control: ?-Stand 1: Required CGA, pt able to hold stand for 1.25 minutes. Noted significant bilateral knee flexion in stance, provided min tactile cues to facilitate knee extension and pt able to demonstrate improved knee extension with cues. Noted improved trunk extension vs standing in [place]   ?-Stand 2: Min A for glute extension, pt able to hold for 3.5 minutes. Noted minor improvement w/B knee extension, heavy reliance on BUEs  ?-Stand 3 and 4: required mod A due to fatigue, max tactile cues provided to glutes to promote extension. Pt able to hold each stand for 1.25 minutes, increased forward lean noted 2/2 fatigue.  ? ?Ther Act  ?Discussed continuation of HEP and adding seated hamstring stretch due to strain of caregiver performing  supine stretch. Began to initiate lateral leans to prime for slide board transfers. Due to fire drill, unable to practice lateral scoots today.   ? ?PATIENT EDUCATION: ?Education details: Sit <>stands at home with [facility], importance of lateral weight shifting for transfers, performing seated hamstring stretch for improved knee extension in weightbearing   ?Person educated: Patient and wife ?Education method: Explanation, Demonstration, Verbal cues, and Handouts ?Education comprehension: verbalized understanding, returned demonstration, and needs further education ?  ?  ?HOME EXERCISE PROGRAM: ?[email or web address] ?  ?ASSESSMENT: ?  ?CLINICAL IMPRESSION: ?Emphasis of skilled PT session on sit <>stands, weightbearing tolerance and priming for  slide board transfers. Pt able to perform initial sit <>stand w/CGA but fatigues quickly. Pt requires bilat knee block to provide tactile cues for knee extension and prevent knee buckle. Pt educated on practicing lateral weight shifts in seated position to prime for slide board transfers but was unable to practice today. Will continue to progress transfers and standing tolerance for improved independence and functional mobility.  ?  ?  ?OBJECTIVE IMPAIRMENTS decreased activity tolerance, decreased balance, decreased coordination, decreased endurance, decreased knowledge of use of DME, decreased mobility, difficulty walking, decreased ROM, decreased strength, hypomobility, impaired perceived functional ability, impaired flexibility, impaired UE functional use, and postural dysfunction.  ?  ?ACTIVITY LIMITATIONS cleaning, community activity, meal prep, laundry, and medication management.  ?  ?PERSONAL FACTORS Time since onset of injury/illness/exacerbation and 3+ comorbidities: anxiety, OA, cancer, subdural hematoma  are also affecting patient's functional outcome.  ?  ?  ?REHAB POTENTIAL: Fair due to amount of time  since onset and previous progress with PT ?  ?CLINICAL DECISION MAKING: Evolving/moderate complexity ?  ?EVALUATION COMPLEXITY: Moderate ?  ?  ?GOALS: ?Goals reviewed with patient? Yes ?  ?SHORT TERM GOALS: [place]

## 2021-04-13 ENCOUNTER — Telehealth: Payer: Self-pay | Admitting: Orthopedic Surgery

## 2021-04-13 DIAGNOSIS — M19019 Primary osteoarthritis, unspecified shoulder: Secondary | ICD-10-CM

## 2021-04-13 NOTE — Telephone Encounter (Signed)
Patient's wife Anthony Downs, designated contact on file, ph# [phone], relays his shoulder is 'ready for another flouro injection', as he has previously had done at [facility]. States insurance and all information is the same.Please advise. ?

## 2021-04-14 ENCOUNTER — Ambulatory Visit: Payer: Medicare Other

## 2021-04-14 ENCOUNTER — Other Ambulatory Visit: Payer: Self-pay

## 2021-04-14 ENCOUNTER — Encounter: Payer: Self-pay | Admitting: Occupational Therapy

## 2021-04-14 ENCOUNTER — Ambulatory Visit: Payer: Medicare Other | Admitting: Occupational Therapy

## 2021-04-14 DIAGNOSIS — R208 Other disturbances of skin sensation: Secondary | ICD-10-CM

## 2021-04-14 DIAGNOSIS — M25611 Stiffness of right shoulder, not elsewhere classified: Secondary | ICD-10-CM

## 2021-04-14 DIAGNOSIS — R29898 Other symptoms and signs involving the musculoskeletal system: Secondary | ICD-10-CM

## 2021-04-14 DIAGNOSIS — R278 Other lack of coordination: Secondary | ICD-10-CM

## 2021-04-14 DIAGNOSIS — R2689 Other abnormalities of gait and mobility: Secondary | ICD-10-CM

## 2021-04-14 DIAGNOSIS — M25621 Stiffness of right elbow, not elsewhere classified: Secondary | ICD-10-CM

## 2021-04-14 DIAGNOSIS — R293 Abnormal posture: Secondary | ICD-10-CM

## 2021-04-14 DIAGNOSIS — M6281 Muscle weakness (generalized): Secondary | ICD-10-CM

## 2021-04-14 NOTE — Telephone Encounter (Signed)
Injection ordered and pt wife notified.  ?

## 2021-04-14 NOTE — Therapy (Signed)
?OUTPATIENT PHYSICAL THERAPY TREATMENT NOTE ? ? ?Patient Name: Anthony Downs ?MRN: [id number] ?DOB:1950/05/02, 71 y.o., male ?Today's Date: 04/14/2021 ? ?PCP: Redmond School, MD ?REFERRING PROVIDER: [name], MD  ? ? PT End of Session - 04/14/21 1852   ? ? Visit Number 4   ? Number of Visits 17   ? Date for PT Re-Evaluation 05/31/21   ? Authorization Type Medicare and UHC (needs 10th visit progress note)   ? Progress Note Due on Visit 10   ? PT Start Time 1400   ? PT Stop Time 1324   ? PT Time Calculation (min) 45 min   ? Equipment Utilized During Treatment Other (comment);Gait belt   stedy for transfers  ? Activity Tolerance Patient tolerated treatment well   ? Behavior During Therapy [facility] for tasks assessed/performed   ? ?  ?  ? ?  ? ? ? ? ?Past Medical History:  ?Diagnosis Date  ? Acid reflux   ? Anxiety   ? Arthritis   ? Asthma   ? as a child  ? Basal cell carcinoma 01/26/2021  ? RIGHT POST SURICULAR INFERIOR  ? Basal cell carcinoma 01/26/2021  ? RIGHT POST AURICULAR SUPERIOR  ? Benign brain tumor ([place])   ? Cancer [facility])   ? 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, squamous behind ear  ? Complication of anesthesia   ? slow to wake up and pain medicines make him sick  ? GERD (gastroesophageal reflux disease)   ? Hypercholesteremia   ? Hypertension   ? PONV (postoperative nausea and vomiting)   ? Sleep apnea   ? no Cpap  ? Subdural hematoma, post-traumatic 2008  ? fell off truck hit head on concrete  ? ?Past Surgical History:  ?Procedure Laterality Date  ? APPLICATION OF CRANIAL NAVIGATION Left 04/29/2020  ? Procedure: APPLICATION OF CRANIAL NAVIGATION;  Surgeon: Judith Part, MD;  Location: [place];  Service: Neurosurgery;  Laterality: Left;  ? arthroscopic knee    ? COLONOSCOPY N/A 11/29/2018  ? Procedure: COLONOSCOPY;  Surgeon: Rogene Houston, MD;  [facility];  Service: Endoscopy;  Laterality: N/A;  730-rescheduled 9/2 same time per Lelon Frohlich  ? CRANIOTOMY Left 04/29/2020  ? Procedure: LEFT  CRANIECTOMY WITH TUMOR EXCISION;  Surgeon: Judith Part, MD;  Location: [place];  Service: Neurosurgery;  Laterality: Left;  ? KNEE SURGERY Right   ? NASAL SEPTOPLASTY W/ TURBINOPLASTY Bilateral 03/19/2020  ? Procedure: NASAL SEPTOPLASTY WITH BILATERAL  TURBINATE REDUCTION;  Surgeon: Leta Baptist, MD;  Location: [place];  Service: ENT;  Laterality: Bilateral;  ? VASECTOMY    ? ?Patient Active Problem List  ? Diagnosis Date Noted  ? Seizure ([place]) 08/13/2020  ? Seizures ([place]) 08/12/2020  ? 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 DVT (deep venous thrombosis) ([place]) 08/12/2020  ? COVID-19 virus infection 08/12/2020  ? Acute lower UTI 07/14/2020  ? Delirium 07/06/2020  ? Sleep apnea 07/06/2020  ? GERD (gastroesophageal reflux disease) 07/06/2020  ? 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[place]) 06/25/2020  ? Sundowning 06/25/2020  ? Slow transit constipation   ? Essential hypertension   ? Hypokalemia   ? Postoperative pain   ? Meningioma ([place]) 05/02/2020  ? Brain tumor ([place]) 04/28/2020  ? S/P nasal septoplasty 03/19/2020  ? Special screening for malignant neoplasms, colon 01/09/2018  ? ? ?REFERRING DIAG: D32.9 (ICD-10-CM) - Meningioma ([place]) G82.50 (ICD-10-CM) - 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[place]) R56.9 (ICD-10-CM) - Seizures ([place])   ? ?THERAPY DIAG:  ?Muscle weakness (generalized) ? ?Abnormal posture ? ?  Other abnormalities of gait and mobility ? ?PERTINENT HISTORY: anxiety, OA, cancer, subdural hematoma  ?  ? ?PRECAUTIONS: Fall ? ?SUBJECTIVE: Pt reports he is doing okay. No new complaints.Pt present with wife. ? ?PAIN:  ?Are you having pain? No ? ? ?OBJECTIVE:  ?  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 There are mild chronic small-vessel ischemic ?changes of the white matter. There is atrophy, encephalomalacia and ?gliosis along the medial aspects of both sides of the brain at the ?frontoparietal vertex, sequela of the previous tumor and subsequent ?resection. It  appears that the midportion of the superior sagittal ?sinus has been resected. No evidence of tumor in the venous sinuses ?anterior or posterior to that. ?  ?  ?MUSCLE LENGTH: ?Hamstrings: Right approx 5-10 deg from full ext, Left marked lag, approx 20-30 deg from extension  ?  ? ?POSTURE: rounded shoulders, forward head, increased thoracic kyphosis, posterior pelvic tilt, and flexed trunk  ?  ?LE ROM:   Will formally assess at future visit. ?  ?Passive  Right ?04/01/2021 Left ?04/01/2021  ?Hip flexion      ?Hip extension      ?Hip abduction      ?Hip adduction      ?Hip internal rotation      ?Hip external rotation      ?Knee flexion      ?Knee extension      ?Ankle dorsiflexion      ?Ankle plantarflexion      ?Ankle inversion      ?Ankle eversion      ? (Blank rows = not tested) ?  ?  ?  ?TODAY'S TREATMENT:  ?At the start of the session, pt was lying in bed in supine after OT session. ?Session initiated by observing patient's supine to sit transfer. Discussed with wife on how she does this at home. Wife and patient educated on safe patient handling by going through steps with verbal instructions, demonstrations and hand placement during patient transfer to reduce strain on caregiver and patient.  ?- Education: Bend patient's contralateral knee, have patient reach to bed rail with bil UE and then assist pt with rolling by placing one hand on knee and one hand on posterior contralateral shoulder (not pull from arm). ?- once patient is in sidelying, bend hips/knees to 90/90 in sidelying and drop lower legs off edge of bed. Ask patient to push through L arm to assist from sidelyign to sitting. Wife can place her forearm under pt's neck but lift him from his shoulders and never strain pt's neck while lifting. Mod to max A with supine to sit transfer ? ?We practiced standing with Standing frame: 5 triasl: 30 sec, 1 min, 1 min, 2 min, 5 min. Pt was initially complaining of L knee anterior pain with first initial trials.  Knee pads were adjusted to improve comfort. Pt cued to engage gluts when standing and pushing shoulders back to improve spinal muscle engagement. Tactile cues provided at anterior shoulders to improve erect posture.  ? ?Performed scooting at edge of bed, tactile cues to lean forward> pt scooted 1 foot to R and L, mod A required ? ?Elevated bed to power chair transfer: with slide board: min to mod A required for safety: 1x ? ? ?PATIENT EDUCATION: ?Education details: Sit <>stands at home with [facility], importance of lateral weight shifting for transfers, performing seated hamstring stretch for improved knee extension in  weightbearing   ?Person educated: Patient and wife ?Education method: Explanation, Demonstration, Verbal cues, and Handouts ?Education comprehension: verbalized understanding, returned demonstration, and needs further education ?  ?  ?HOME EXERCISE PROGRAM: ?[email or web address] ?  ?ASSESSMENT: ?  ?CLINICAL IMPRESSION: ?Today's skilled session focused on continued improvement with standing tolerance to improve WB through LE and spine. Pt has significant posterior knee and anterior hip contractures which prevents him from standing straight and limits his standing endurance due to excessive fatigue. Pt able to perform lateral transfer with slide board with min to mod A ?  ?OBJECTIVE IMPAIRMENTS decreased activity tolerance, decreased balance, decreased coordination, decreased endurance, decreased knowledge of use of DME, decreased mobility, difficulty walking, decreased ROM, decreased strength, hypomobility, impaired perceived functional ability, impaired flexibility, impaired UE functional use, and postural dysfunction.  ?  ?ACTIVITY LIMITATIONS cleaning, community activity, meal prep, laundry, and medication management.  ?  ?PERSONAL FACTORS Time since onset of injury/illness/exacerbation and 3+ comorbidities: anxiety, OA, cancer, subdural  hematoma  are also affecting patient's functional outcome.  ?  ?  ?REHAB POTENTIAL: Fair due to amount of time since onset and previous progress with PT ?  ?CLINICAL DECISION MAKING: Evolving/moderate comple

## 2021-04-14 NOTE — Therapy (Signed)
?OUTPATIENT OCCUPATIONAL THERAPY TREATMENT NOTE ? ? ?Patient Name: Anthony Downs ?MRN: [id number] ?DOB:April 12, 1950, 71 y.o., male ?Today's Date: 04/14/2021 ? ?PCP: Redmond School, MD ?REFERRING PROVIDER: [name], MD ? ? OT End of Session - 04/14/21 1319   ? ? Visit Number 4   ? Number of Visits 17   ? Date for OT Re-Evaluation 06/15/21   ? Authorization Type Medicare and UHC   ? Authorization Time Period UHC VL - Need medical auth after 30 visits   ? OT Start Time 1318   ? OT Stop Time 1400   ? OT Time Calculation (min) 42 min   ? Activity Tolerance Patient tolerated treatment well   ? Behavior During Therapy [facility] for tasks assessed/performed   ? ?  ?  ? ?  ? ? ?Past Medical History:  ?Diagnosis Date  ? Acid reflux   ? Anxiety   ? Arthritis   ? Asthma   ? as a child  ? Basal cell carcinoma 01/26/2021  ? RIGHT POST SURICULAR INFERIOR  ? Basal cell carcinoma 01/26/2021  ? RIGHT POST AURICULAR SUPERIOR  ? Benign brain tumor ([place])   ? Cancer [facility])   ? 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, squamous behind ear  ? Complication of anesthesia   ? slow to wake up and pain medicines make him sick  ? GERD (gastroesophageal reflux disease)   ? Hypercholesteremia   ? Hypertension   ? PONV (postoperative nausea and vomiting)   ? Sleep apnea   ? no Cpap  ? Subdural hematoma, post-traumatic 2008  ? fell off truck hit head on concrete  ? ?Past Surgical History:  ?Procedure Laterality Date  ? APPLICATION OF CRANIAL NAVIGATION Left 04/29/2020  ? Procedure: APPLICATION OF CRANIAL NAVIGATION;  Surgeon: Judith Part, MD;  Location: [place];  Service: Neurosurgery;  Laterality: Left;  ? arthroscopic knee    ? COLONOSCOPY N/A 11/29/2018  ? Procedure: COLONOSCOPY;  Surgeon: Rogene Houston, MD;  [facility];  Service: Endoscopy;  Laterality: N/A;  730-rescheduled 9/2 same time per Lelon Frohlich  ? CRANIOTOMY Left 04/29/2020  ? Procedure: LEFT CRANIECTOMY WITH TUMOR EXCISION;  Surgeon: Judith Part, MD;  Location: [place];  Service: Neurosurgery;  Laterality: Left;  ? KNEE SURGERY Right   ? NASAL SEPTOPLASTY W/ TURBINOPLASTY Bilateral 03/19/2020  ? Procedure: NASAL SEPTOPLASTY WITH BILATERAL  TURBINATE REDUCTION;  Surgeon: Leta Baptist, MD;  Location: [place];  Service: ENT;  Laterality: Bilateral;  ? VASECTOMY    ? ?Patient Active Problem List  ? Diagnosis Date Noted  ? Seizure ([place]) 08/13/2020  ? Seizures ([place]) 08/12/2020  ? 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 DVT (deep venous thrombosis) ([place]) 08/12/2020  ? COVID-19 virus infection 08/12/2020  ? Acute lower UTI 07/14/2020  ? Delirium 07/06/2020  ? Sleep apnea 07/06/2020  ? GERD (gastroesophageal reflux disease) 07/06/2020  ? 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[place]) 06/25/2020  ? Sundowning 06/25/2020  ? Slow transit constipation   ? Essential hypertension   ? Hypokalemia   ? Postoperative pain   ? Meningioma ([place]) 05/02/2020  ? Brain tumor ([place]) 04/28/2020  ? S/P nasal septoplasty 03/19/2020  ? Special screening for malignant neoplasms, colon 01/09/2018  ? ? ?ONSET DATE: 04/28/20 ? ?REFERRING DIAG: Meningioma, Tetraplegia ? ?THERAPY DIAG:  ?Muscle weakness (generalized) ? ?Abnormal posture ? ?Other abnormalities of gait and mobility ? ?Other lack of coordination ? ?Stiffness of right shoulder, not elsewhere classified ? ?Other disturbances of skin sensation ? ?  Stiffness of right elbow, not elsewhere classified ? ?Other symptoms and signs involving the musculoskeletal system ? ? ?PERTINENT HISTORY: traumatic SDH 2008 ? ?PRECAUTIONS: Fall Risk ? ?SUBJECTIVE: Denies any changes or anything new. Denies pain ? ?PAIN:  ?Are you having pain? No ? ? ? ? ?OBJECTIVE:  ? ?TODAY'S TREATMENT:  ?04/14/21 ?Transfer via Stedy to mat from power chair. ? ?Bed mobility  with transitioning from edge of mat to sidelying on right side with max A. Pt hesitant with lateral flexion to RUE elbow/forearm and needed assistance for leg management. Pt could not tolerate sidelying d/t RUE pain. ?Transition from sidelying to supine to  with min A.  ?Transition from supine to sidelying on left side with total A ? ?RUE NMR with gravity eliminated on block for shoulder flexion/extension and scap retraction and protraction while in sidelying. Pt req'd min cueing for task. ? ?Pt was left supine on mat with spouse at side for any assistance. Waiting on PT. ? ? OT Short Term Goals - 04/01/21 1645   ? ?  ? OT SHORT TERM GOAL #1  ? Title Patient and caregiver will complete HEP designed to improve passive and active range of motion in BUE   ? Time 4   ? Period Weeks   ? Status New   ? Target Date 05/16/21   ?  ? OT SHORT TERM GOAL #2  ? Title Patient will utilize RUE to assist with at least one ADL task with mod cueing and minimal assistance   ? Time 4   ? Period Weeks   ? Status New   ?  ? OT SHORT TERM GOAL #3  ? Title Patient will reach at least mid calf toward feet with BUE in preparation for participating in LB dressing   ? Time 4   ? Period Weeks   ? Status New   ?  ? OT SHORT TERM GOAL #4  ? Title Patient will demonstrate sufficient lateral lean left or right in seated position to prepare for placement of sliding board to encourage more active transfers and increase potential for commode transfers.   ? Time 4   ? Period Weeks   ? Status New   ?  ? OT SHORT TERM GOAL #5  ? Title Pt will be educated on available AE and DME to assist with ADL completion as needed for improved independence.   ? Time 4   ? Status New   ? ?  ?  ? ?  ? ? ? OT Long Term Goals - 04/01/21 1652   ? ?  ? OT LONG TERM GOAL #1  ? Title Patient will complete updated HEP designed to improve range, strength, and functional use of BUE   ? Time 8   ? Period Weeks   ? Status New   ? Target Date 06/15/21   ?  ? OT LONG TERM GOAL #2  ? Title Patient will complete an active transfer level surface with slide board toward right with max assistance in preparation for commode transfer   ? Time 8   ? Period Weeks   ? Status New   ?  ? OT LONG TERM GOAL #3  ? Title Patient will demonstrate  improved coordination in RUE as evidenced by at least 8 blocks (box in blocks) in one minute   ? Baseline 4   ? Time 8   ? Period Weeks   ? Status New   ?  ? OT  LONG TERM GOAL #4  ? Title Patient will demonstrate a 4lb increase in grip strength in RUE   ? Baseline 6.1   ? Time 8   ? Period Weeks   ? Status New   ?  ? OT LONG TERM GOAL #5  ? Title Per patient and wife, patient will complete at least 30% more of bathing and dressing tasks   ? Time 8   ? Period Weeks   ? Status New   ? ?  ?  ? ?  ? ? ? Plan - 04/14/21 1535   ? ? Clinical Impression Statement Pt continues to be limited by pain in RUE but tolerated PROM and AAROM today. Pt worked on closed chain ROM and tolerated with min/mod assistance for support.   ? OT Occupational Profile and History Detailed Assessment- Review of Records and additional review of physical, cognitive, psychosocial history related to current functional performance   ? Occupational performance deficits (Please refer to evaluation for details): ADL's;IADL's;Leisure   ? Body Structure / Function / Physical Skills ADL;Decreased knowledge of use of DME;Gait;Obesity;Strength;Tone;GMC;Dexterity;Balance;Body mechanics;Edema;Proprioception;UE functional use;Cardiopulmonary status limiting activity;Endurance;IADL;ROM;Continence;Coordination;Flexibility;Mobility;Sensation;FMC;Decreased knowledge of precautions;Muscle spasms   ? Rehab Potential Good   ? Clinical Decision Making Several treatment options, min-mod task modification necessary   ? Comorbidities Affecting Occupational Performance: May have comorbidities impacting occupational performance   ? Modification or Assistance to Complete Evaluation  Min-Moderate modification of tasks or assist with assess necessary to complete eval   ? OT Frequency 2x / week   ? OT Duration 8 weeks   ? OT Treatment/Interventions Self-care/ADL training;Aquatic Therapy;DME and/or AE instruction;Splinting;Balance training;Therapeutic activities;Therapeutic  exercise;Cognitive remediation/compensation;Passive range of motion;Functional Mobility Training;Neuromuscular education;Electrical Stimulation;Energy conservation;Patient/family education;Manual Therapy   ? Plan

## 2021-04-16 ENCOUNTER — Encounter: Payer: Self-pay | Admitting: Occupational Therapy

## 2021-04-16 ENCOUNTER — Ambulatory Visit: Payer: Medicare Other | Admitting: Physical Therapy

## 2021-04-16 ENCOUNTER — Ambulatory Visit: Payer: Medicare Other | Admitting: Occupational Therapy

## 2021-04-16 DIAGNOSIS — R2689 Other abnormalities of gait and mobility: Secondary | ICD-10-CM

## 2021-04-16 DIAGNOSIS — M25611 Stiffness of right shoulder, not elsewhere classified: Secondary | ICD-10-CM

## 2021-04-16 DIAGNOSIS — M6281 Muscle weakness (generalized): Secondary | ICD-10-CM | POA: Diagnosis not present

## 2021-04-16 DIAGNOSIS — R208 Other disturbances of skin sensation: Secondary | ICD-10-CM

## 2021-04-16 DIAGNOSIS — R278 Other lack of coordination: Secondary | ICD-10-CM

## 2021-04-16 DIAGNOSIS — M25621 Stiffness of right elbow, not elsewhere classified: Secondary | ICD-10-CM

## 2021-04-16 DIAGNOSIS — R293 Abnormal posture: Secondary | ICD-10-CM

## 2021-04-16 NOTE — Therapy (Signed)
?OUTPATIENT PHYSICAL THERAPY TREATMENT NOTE ? ? ?Patient Name: Anthony Downs ?MRN: [id number] ?DOB:[birth year], 71 y.o., male ?Today's Date: 04/16/2021 ? ?PCP: Redmond School, MD ?REFERRING PROVIDER: [name], MD  ? ? PT End of Session - 04/16/21 1449   ? ? Visit Number 5   ? Number of Visits 17   ? Date for PT Re-Evaluation 05/31/21   ? Authorization Type Medicare and UHC (needs 10th visit progress note)   ? Progress Note Due on Visit 10   ? PT Start Time 1017   ? PT Stop Time 1526   ? PT Time Calculation (min) 39 min   ? Equipment Utilized During Treatment Other (comment);Gait belt   stedy for transfers  ? Activity Tolerance Patient tolerated treatment well   ? Behavior During Therapy [facility] for tasks assessed/performed   ? ?  ?  ? ?  ? ? ? ? ? ?Past Medical History:  ?Diagnosis Date  ? Acid reflux   ? Anxiety   ? Arthritis   ? Asthma   ? as a child  ? Basal cell carcinoma 01/26/2021  ? RIGHT POST SURICULAR INFERIOR  ? Basal cell carcinoma 01/26/2021  ? RIGHT POST AURICULAR SUPERIOR  ? Benign brain tumor ([place])   ? Cancer [facility])   ? 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, squamous behind ear  ? Complication of anesthesia   ? slow to wake up and pain medicines make him sick  ? GERD (gastroesophageal reflux disease)   ? Hypercholesteremia   ? Hypertension   ? PONV (postoperative nausea and vomiting)   ? Sleep apnea   ? no Cpap  ? Subdural hematoma, post-traumatic 2008  ? fell off truck hit head on concrete  ? ?Past Surgical History:  ?Procedure Laterality Date  ? APPLICATION OF CRANIAL NAVIGATION Left 04/29/2020  ? Procedure: APPLICATION OF CRANIAL NAVIGATION;  Surgeon: Judith Part, MD;  Location: [place];  Service: Neurosurgery;  Laterality: Left;  ? arthroscopic knee    ? COLONOSCOPY N/A 11/29/2018  ? Procedure: COLONOSCOPY;  Surgeon: Rogene Houston, MD;  [facility];  Service: Endoscopy;  Laterality: N/A;  730-rescheduled 9/2 same time per Lelon Frohlich  ? CRANIOTOMY Left 04/29/2020  ? Procedure: LEFT  CRANIECTOMY WITH TUMOR EXCISION;  Surgeon: Judith Part, MD;  Location: [place];  Service: Neurosurgery;  Laterality: Left;  ? KNEE SURGERY Right   ? NASAL SEPTOPLASTY W/ TURBINOPLASTY Bilateral 03/19/2020  ? Procedure: NASAL SEPTOPLASTY WITH BILATERAL  TURBINATE REDUCTION;  Surgeon: Leta Baptist, MD;  Location: [place];  Service: ENT;  Laterality: Bilateral;  ? VASECTOMY    ? ?Patient Active Problem List  ? Diagnosis Date Noted  ? Seizure ([place]) 08/13/2020  ? Seizures ([place]) 08/12/2020  ? 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 DVT (deep venous thrombosis) ([place]) 08/12/2020  ? COVID-19 virus infection 08/12/2020  ? Acute lower UTI 07/14/2020  ? Delirium 07/06/2020  ? Sleep apnea 07/06/2020  ? GERD (gastroesophageal reflux disease) 07/06/2020  ? 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[place]) 06/25/2020  ? Sundowning 06/25/2020  ? Slow transit constipation   ? Essential hypertension   ? Hypokalemia   ? Postoperative pain   ? Meningioma ([place]) 05/02/2020  ? Brain tumor ([place]) 04/28/2020  ? S/P nasal septoplasty 03/19/2020  ? Special screening for malignant neoplasms, colon 01/09/2018  ? ? ?REFERRING DIAG: D32.9 (ICD-10-CM) - Meningioma ([place]) G82.50 (ICD-10-CM) - 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[place]) R56.9 (ICD-10-CM) - Seizures ([place])   ? ?THERAPY DIAG:  ?Muscle weakness (generalized) ? ?Abnormal  posture ? ?Other abnormalities of gait and mobility ? ?PERTINENT HISTORY: anxiety, OA, cancer, subdural hematoma  ?  ? ?PRECAUTIONS: Fall ? ?SUBJECTIVE: Pt reports he is sore in his shoulders today but other wise doing well. Standing at home is getting better.  ? ?PAIN:  ?Are you having pain? No ? ? ?OBJECTIVE:  ?  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 There are mild chronic small-vessel ischemic ?changes of the white matter. There is atrophy, encephalomalacia and ?gliosis along the medial aspects of both sides of the brain at the ?frontoparietal vertex, sequela of the  previous tumor and subsequent ?resection. It appears that the midportion of the superior sagittal ?sinus has been resected. No evidence of tumor in the venous sinuses ?anterior or posterior to that. ?  ? ?POSTURE: rounded shoulders, forward head, increased thoracic kyphosis, posterior pelvic tilt, and flexed trunk  ?   ?  ?TODAY'S TREATMENT:  ?NMR  ?In // bars, pt performed 3 sit <>stands w/min-mod A for improved glute activation, upright posture and knee extension. Mod tactile cues provided to L knee to facilitate quad activation and knee extension, which pt able to hold for roughly 20s at a time. Pt able to hold each stand for 3+ minutes with reduced reliance on BUEs. On stand 2 & 3, max tactile cues to facilitate lateral weight shifting to improve weightbearing on RLE and begin pre-gait training. Pt required lengthy rest break between sets but maintained consistency with standing tolerance and assistance required to stand.  ? ?Pt performed lateral slide board from power chair to low mat to L side w/min-mod A for lateral weight shift assistance, foot and hand placement. Pt able to clear hips from mat but is unable to push w/RUE to slide across board well.  ? ? ?PATIENT EDUCATION: ?Education details: Performing lateral weight shifts while standing in stedy at home, continued hamstring stretch for improved knee extension in standing  ?Person educated: Patient and wife ?Education method: Explanation, Demonstration, Verbal cues, and Handouts ?Education comprehension: verbalized understanding, returned demonstration, and needs further education ?  ?  ?HOME EXERCISE PROGRAM: ?[email or web address] ?  ?ASSESSMENT: ?  ?CLINICAL IMPRESSION: ?Emphasis of skilled PT session on improved sit <>stand transfers, weightbearing tolerance, lateral weight shifting and lateral board transfers. Pt required lower level of assistance to achieve standing position and is able  to maintain upright posture with L knee extension for roughly 20s at a time before resuming forward flexed posture. Pt continues to be limited by bilat hamstring contracture and RLE weakness. Pt performed lateral board transfer to L side w/min-mod A using very small scoots, but demonstrates good forward lean and hip clearance for transfer. Continue POC.  ?  ?OBJECTIVE IMPAIRMENTS decreased activity tolerance, decreased balance, decreased coordination, decreased endurance, decreased knowledge of use of DME, decreased mobility, difficulty walking, decreased ROM, decreased strength, hypomobility, impaired perceived functional ability, impaired flexibility, impaired UE functional use, and postural dysfunction.  ?  ?ACTIVITY LIMITATIONS cleaning, community activity, meal prep, laundry, and medication management.  ?  ?PERSONAL FACTORS Time since onset of injury/illness/exacerbation and 3+ comorbidities: anxiety, OA, cancer, subdural hematoma  are also affecting patient's functional outcome.  ?  ?  ?REHAB POTENTIAL: Fair due to amount of time since onset and previous progress with PT ?  ?CLINICAL DECISION MAKING: Evolving/moderate complexity ?  ?EVALUATION COMPLEXITY: Moderate ?  ?  ?GOALS: ?Goals reviewed with patient? Yes ?  ?SHORT TERM GOALS:  Target date: 04/29/2021 ?  ?Pt/caregiver will be IND with HEP in order to indicate improved functional mobility and improved flexibility. ?Baseline:  ?Goal status: INITIAL ?  ?2.  Pt will perform rolling R at min A level and L at mod/max A level in order to indicate improved strength and independence with bed mobility.  ?Baseline:  ?Goal status: INITIAL ?  ?3.  Pt will perform SL>sit at mod A level in order to indicate improved functional mobility.  ?Baseline:  ?Goal status: INITIAL ?  ?4.  Pt will perform sit>SL/supine at min A level in order to indicate improved functional mobility.   ?Baseline:  ?Goal status: INITIAL ?  ?5.  Pt will perform slideboard transers at max A level in  order to indicate improved functional mobility.   ?Baseline:  ?Goal status: INITIAL ?  ?6.  Pt will tolerate standing with use of stedy x 5 mins in order to indicate improved standing tolerance and improved

## 2021-04-16 NOTE — Therapy (Signed)
?OUTPATIENT OCCUPATIONAL THERAPY TREATMENT NOTE ? ? ?Patient Name: Anthony Downs ?MRN: [id number] ?DOB:05-04-1950, 71 y.o., male ?Today's Date: 04/16/2021 ? ?PCP: Redmond School, MD ?REFERRING PROVIDER: [name], MD ? ? OT End of Session - 04/16/21 1620   ? ? Visit Number 5   ? Number of Visits 17   ? Date for OT Re-Evaluation 06/15/21   ? Authorization Type Medicare and UHC   ? Authorization Time Period UHC VL - Need medical auth after 30 visits   ? OT Start Time 1530   ? OT Stop Time 1612   ? OT Time Calculation (min) 42 min   ? Activity Tolerance Patient tolerated treatment well   ? Behavior During Therapy [facility] for tasks assessed/performed   ? ?  ?  ? ?  ? ? ? ?Past Medical History:  ?Diagnosis Date  ? Acid reflux   ? Anxiety   ? Arthritis   ? Asthma   ? as a child  ? Basal cell carcinoma 01/26/2021  ? RIGHT POST SURICULAR INFERIOR  ? Basal cell carcinoma 01/26/2021  ? RIGHT POST AURICULAR SUPERIOR  ? Benign brain tumor ([place])   ? Cancer [facility])   ? 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, squamous behind ear  ? Complication of anesthesia   ? slow to wake up and pain medicines make him sick  ? GERD (gastroesophageal reflux disease)   ? Hypercholesteremia   ? Hypertension   ? PONV (postoperative nausea and vomiting)   ? Sleep apnea   ? no Cpap  ? Subdural hematoma, post-traumatic 2008  ? fell off truck hit head on concrete  ? ?Past Surgical History:  ?Procedure Laterality Date  ? APPLICATION OF CRANIAL NAVIGATION Left 04/29/2020  ? Procedure: APPLICATION OF CRANIAL NAVIGATION;  Surgeon: Judith Part, MD;  Location: [address];  Service: Neurosurgery;  Laterality: Left;  ? arthroscopic knee    ? COLONOSCOPY N/A 11/29/2018  ? Procedure: COLONOSCOPY;  Surgeon: Rogene Houston, MD;  [facility];  Service: Endoscopy;  Laterality: N/A;  730-rescheduled 9/2 same time per Lelon Frohlich  ? CRANIOTOMY Left 04/29/2020  ? Procedure: LEFT CRANIECTOMY WITH TUMOR EXCISION;  Surgeon: Judith Part, MD;  Location: [place];  Service: Neurosurgery;  Laterality: Left;  ? KNEE SURGERY Right   ? NASAL SEPTOPLASTY W/ TURBINOPLASTY Bilateral 03/19/2020  ? Procedure: NASAL SEPTOPLASTY WITH BILATERAL  TURBINATE REDUCTION;  Surgeon: Leta Baptist, MD;  Location: [place];  Service: ENT;  Laterality: Bilateral;  ? VASECTOMY    ? ?Patient Active Problem List  ? Diagnosis Date Noted  ? Seizure ([place]) 08/13/2020  ? Seizures ([place]) 08/12/2020  ? 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 DVT (deep venous thrombosis) ([place]) 08/12/2020  ? COVID-19 virus infection 08/12/2020  ? Acute lower UTI 07/14/2020  ? Delirium 07/06/2020  ? Sleep apnea 07/06/2020  ? GERD (gastroesophageal reflux disease) 07/06/2020  ? 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[place]) 06/25/2020  ? Sundowning 06/25/2020  ? Slow transit constipation   ? Essential hypertension   ? Hypokalemia   ? Postoperative pain   ? Meningioma ([place]) 05/02/2020  ? Brain tumor ([place]) 04/28/2020  ? S/P nasal septoplasty 03/19/2020  ? Special screening for malignant neoplasms, colon 01/09/2018  ? ? ?ONSET DATE: 04/28/20 ? ?REFERRING DIAG: Meningioma, Tetraplegia ? ?THERAPY DIAG:  ?Muscle weakness (generalized) ? ?Other lack of coordination ? ?Stiffness of right shoulder, not elsewhere classified ? ?Stiffness of right elbow, not elsewhere classified ? ?Other disturbances of skin sensation ? ? ?  PERTINENT HISTORY: traumatic SDH 2008 ? ?PRECAUTIONS: Fall Risk ? ?SUBJECTIVE: Pt reports some pain in back after PT ? ?PAIN:  ?Are you having pain? Yes: NPRS scale: 3-4/10 ?Pain location: back ?Pain description: ache ?Aggravating factors: after PT - standing and transfer ?Relieving factors: tylenol and rest ? ? ? ? ?OBJECTIVE:  ? ?TODAY'S TREATMENT:  ?04/16/21 ?Pt seated edge of mat upon arrival. Pt worked on reaching to floor while maintaining sitting balance with reaching to around mid shin and sitting back up with SBA. Pt worked on trunk rotation and dynamic balance sitting edge of mat with reaching outside of base of support with unilateral UE support.  Pt has good static and dynamic sitting balance. ? ?Transfer via Sliding Board from edge of mat to power chair to left with MAX assistance. ? ?Attempted arm bike however patient has wheelchair lock on bottom preventing from getting close enough. ? ?Dowel exercises while seated in power chair at low level for integrating RUE into closed chain, AAROM, exercises x 10 reps for chest press, shoulder flexion to chest and circumduction ? ? ?04/14/21 ?Transfer via Stedy to mat from power chair. ? ?Bed mobility  with transitioning from edge of mat to sidelying on right side with max A. Pt hesitant with lateral flexion to RUE elbow/forearm and needed assistance for leg management. Pt could not tolerate sidelying d/t RUE pain. ?Transition from sidelying to supine to with min A.  ?Transition from supine to sidelying on left side with total A ? ?RUE NMR with gravity eliminated on block for shoulder flexion/extension and scap retraction and protraction while in sidelying. Pt req'd min cueing for task. ? ?Pt was left supine on mat with spouse at side for any assistance. Waiting on PT. ? ? OT Short Term Goals - 04/01/21 1645   ? ?  ? OT SHORT TERM GOAL #1  ? Title Patient and caregiver will complete HEP designed to improve passive and active range of motion in BUE   ? Time 4   ? Period Weeks   ? Status Ongoing  ? Target Date 05/16/21   ?  ? OT SHORT TERM GOAL #2  ? Title Patient will utilize RUE to assist with at least one ADL task with mod cueing and minimal assistance   ? Time 4   ? Period Weeks   ? Status Ongoing spouse reports encouraging patient to use RUE a lot of the time during ADLs.  ?  ? OT SHORT TERM GOAL #3  ? Title Patient will reach at least mid calf toward feet with BUE in preparation for participating in LB dressing   ? Time 4   ? Period Weeks   ? Status Ongoing   ?  ? OT SHORT TERM GOAL #4  ? Title Patient will demonstrate sufficient lateral lean left or right in seated position to prepare for placement of sliding  board to encourage more active transfers and increase potential for commode transfers.   ? Time 4   ? Period Weeks   ? Status Ongoing  ?  ? OT SHORT TERM GOAL #5  ? Title Pt will be educated on available AE and DME to assist with ADL completion as needed for improved independence.   ? Time 4   ? Status Ongoing  ? ?  ?  ? ?  ? ? ? OT Long Term Goals - 04/01/21 1652   ? ?  ? OT LONG TERM GOAL #1  ? Title Patient will complete  updated HEP designed to improve range, strength, and functional use of BUE   ? Time 8   ? Period Weeks   ? Status New   ? Target Date 06/15/21   ?  ? OT LONG TERM GOAL #2  ? Title Patient will complete an active transfer level surface with slide board toward right with max assistance in preparation for commode transfer   ? Time 8   ? Period Weeks   ? Status New   ?  ? OT LONG TERM GOAL #3  ? Title Patient will demonstrate improved coordination in RUE as evidenced by at least 8 blocks (box in blocks) in one minute   ? Baseline 4   ? Time 8   ? Period Weeks   ? Status New   ?  ? OT LONG TERM GOAL #4  ? Title Patient will demonstrate a 4lb increase in grip strength in RUE   ? Baseline 6.1   ? Time 8   ? Period Weeks   ? Status New   ?  ? OT LONG TERM GOAL #5  ? Title Per patient and wife, patient will complete at least 30% more of bathing and dressing tasks   ? Time 8   ? Period Weeks   ? Status New   ? ?  ?  ? ?  ? ? ? Plan - 04/14/21 1535   ? ? Clinical Impression Statement Pt continues to be limited by pain in RUE but tolerated PROM and AAROM today. Pt worked on closed chain ROM and tolerated with min/mod assistance for support.   ? OT Occupational Profile and History Detailed Assessment- Review of Records and additional review of physical, cognitive, psychosocial history related to current functional performance   ? Occupational performance deficits (Please refer to evaluation for details): ADL's;IADL's;Leisure   ? Body Structure / Function / Physical Skills ADL;Decreased knowledge of use of  DME;Gait;Obesity;Strength;Tone;GMC;Dexterity;Balance;Body mechanics;Edema;Proprioception;UE functional use;Cardiopulmonary status limiting activity;Endurance;IADL;ROM;Continence;Coordination;Flexibility;M

## 2021-04-21 ENCOUNTER — Ambulatory Visit: Payer: Medicare Other | Attending: Internal Medicine | Admitting: Rehabilitation

## 2021-04-21 ENCOUNTER — Encounter: Payer: Self-pay | Admitting: Rehabilitation

## 2021-04-21 ENCOUNTER — Encounter: Payer: Self-pay | Admitting: Occupational Therapy

## 2021-04-21 ENCOUNTER — Ambulatory Visit: Payer: Medicare Other | Admitting: Occupational Therapy

## 2021-04-21 DIAGNOSIS — M25621 Stiffness of right elbow, not elsewhere classified: Secondary | ICD-10-CM | POA: Insufficient documentation

## 2021-04-21 DIAGNOSIS — R208 Other disturbances of skin sensation: Secondary | ICD-10-CM | POA: Diagnosis present

## 2021-04-21 DIAGNOSIS — R2681 Unsteadiness on feet: Secondary | ICD-10-CM | POA: Insufficient documentation

## 2021-04-21 DIAGNOSIS — M25611 Stiffness of right shoulder, not elsewhere classified: Secondary | ICD-10-CM

## 2021-04-21 DIAGNOSIS — M6281 Muscle weakness (generalized): Secondary | ICD-10-CM | POA: Diagnosis present

## 2021-04-21 DIAGNOSIS — R2689 Other abnormalities of gait and mobility: Secondary | ICD-10-CM | POA: Insufficient documentation

## 2021-04-21 DIAGNOSIS — R293 Abnormal posture: Secondary | ICD-10-CM

## 2021-04-21 DIAGNOSIS — R29898 Other symptoms and signs involving the musculoskeletal system: Secondary | ICD-10-CM

## 2021-04-21 DIAGNOSIS — R278 Other lack of coordination: Secondary | ICD-10-CM

## 2021-04-21 NOTE — Therapy (Signed)
?OUTPATIENT PHYSICAL THERAPY TREATMENT NOTE ? ? ?Patient Name: Anthony Downs ?MRN: [id number] ?DOB:[birth year], 71 y.o., male ?Today's Date: 04/21/2021 ? ?PCP: Redmond School, MD ?REFERRING PROVIDER: [name], MD  ? ? PT End of Session - 04/21/21 1629   ? ? Visit Number 6   ? Number of Visits 17   ? Date for PT Re-Evaluation 05/31/21   ? Authorization Type Medicare and UHC (needs 10th visit progress note)   ? Progress Note Due on Visit 10   ? PT Start Time 1318   ? PT Stop Time 1400   ? PT Time Calculation (min) 42 min   ? Equipment Utilized During Treatment Other (comment);Gait belt   stedy for transfers  ? Activity Tolerance Patient tolerated treatment well   ? Behavior During Therapy [facility] for tasks assessed/performed   ? ?  ?  ? ?  ? ? ? ? ? ?Past Medical History:  ?Diagnosis Date  ? Acid reflux   ? Anxiety   ? Arthritis   ? Asthma   ? as a child  ? Basal cell carcinoma 01/26/2021  ? RIGHT POST SURICULAR INFERIOR  ? Basal cell carcinoma 01/26/2021  ? RIGHT POST AURICULAR SUPERIOR  ? Benign brain tumor ([place])   ? Cancer [facility])   ? 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, squamous behind ear  ? Complication of anesthesia   ? slow to wake up and pain medicines make him sick  ? GERD (gastroesophageal reflux disease)   ? Hypercholesteremia   ? Hypertension   ? PONV (postoperative nausea and vomiting)   ? Sleep apnea   ? no Cpap  ? 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 fell off truck hit head on concrete  ? ?Past Surgical History:  ?Procedure Laterality Date  ? APPLICATION OF CRANIAL NAVIGATION Left 04/29/2020  ? Procedure: APPLICATION OF CRANIAL NAVIGATION;  Surgeon: Judith Part, MD;  Location: [place];  Service: Neurosurgery;  Laterality: Left;  ? arthroscopic knee    ? COLONOSCOPY N/A 11/29/2018  ? Procedure: COLONOSCOPY;  Surgeon: Rogene Houston, MD;  [facility];  Service: Endoscopy;  Laterality: N/A;  730-rescheduled 9/2 same time per Lelon Frohlich  ? CRANIOTOMY Left 04/29/2020  ? Procedure:  LEFT CRANIECTOMY WITH TUMOR EXCISION;  Surgeon: Judith Part, MD;  Location: [place];  Service: Neurosurgery;  Laterality: Left;  ? KNEE SURGERY Right   ? NASAL SEPTOPLASTY W/ TURBINOPLASTY Bilateral 03/19/2020  ? Procedure: NASAL SEPTOPLASTY WITH BILATERAL  TURBINATE REDUCTION;  Surgeon: Leta Baptist, MD;  Location: [place];  Service: ENT;  Laterality: Bilateral;  ? VASECTOMY    ? ?Patient Active Problem List  ? Diagnosis Date Noted  ? Seizure ([place]) 08/13/2020  ? Seizures ([place]) 08/12/2020  ? 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 DVT (deep venous thrombosis) ([place]) 08/12/2020  ? COVID-19 virus infection 08/12/2020  ? Acute lower UTI 07/14/2020  ? Delirium 07/06/2020  ? Sleep apnea 07/06/2020  ? GERD (gastroesophageal reflux disease) 07/06/2020  ? 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[place]) 06/25/2020  ? Sundowning 06/25/2020  ? Slow transit constipation   ? Essential hypertension   ? Hypokalemia   ? Postoperative pain   ? Meningioma ([place]) 05/02/2020  ? Brain tumor ([place]) 04/28/2020  ? S/P nasal septoplasty 03/19/2020  ? Special screening for malignant neoplasms, colon 01/09/2018  ? ? ?REFERRING DIAG: D32.9 (ICD-10-CM) - Meningioma ([place]) G82.50 (ICD-10-CM) - 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[place]) R56.9 (ICD-10-CM) - Seizures ([place])   ? ?THERAPY DIAG:  ?Muscle weakness (generalized) ? ?  Abnormal posture ? ?Other abnormalities of gait and mobility ? ?Other disturbances of skin sensation ? ?PERTINENT HISTORY: anxiety, OA, cancer, subdural hematoma  ?  ? ?PRECAUTIONS: Fall ? ?SUBJECTIVE: Pt reports he is sore in his shoulders today but other wise doing well. Standing at home is getting better.  ? ?PAIN:  ?Are you having pain? No ? ? ?OBJECTIVE:  ?  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 There are mild chronic small-vessel ischemic ?changes of the white matter. There is atrophy, encephalomalacia and ?gliosis along the medial aspects of both sides of the brain at  the ?frontoparietal vertex, sequela of the previous tumor and subsequent ?resection. It appears that the midportion of the superior sagittal ?sinus has been resected. No evidence of tumor in the venous sinuses ?anterior or posterior to that. ?  ? ?POSTURE: rounded shoulders, forward head, increased thoracic kyphosis, posterior pelvic tilt, and flexed trunk  ?   ?  ?TODAY'S TREATMENT:  ?TA:  ?In // bars, pt performed 4 sit <>stands w/min-mod A for improved glute activation, upright posture and knee extension. Also cues for increased forward weight shift and trunk lean. Mod tactile cues provided to L knee to facilitate quad activation and knee extension, which pt able to hold for roughly 20s at a time. Pt able to hold each stand for 1-2.30 minutes with reduced reliance on BUEs. On stand 2, had him work to lift RUE from bars and reach right as he tends to keep weight shifting left.  On stand 3 and 4, worked on him shifting onto RLE and lifting heel of L foot.  He is able to do x 3 reps each time with max cues.   ?Pt performed lateral slide board from mat>power w/c to the R with mod A for lateral weight shift assistance, foot and hand placement. Pt able to clear hips from mat at times with increased facilitation and cues for forward weight shift.  ? ? ?PATIENT EDUCATION: ?Education details: Performing lateral weight shifts while standing in stedy at home, continued hamstring stretch for improved knee extension in standing  ?Person educated: Patient and wife ?Education method: Explanation, Demonstration, Verbal cues, and Handouts ?Education comprehension: verbalized understanding, returned demonstration, and needs further education ?  ?  ?HOME EXERCISE PROGRAM: ?[email or web address] ?  ?ASSESSMENT: ?  ?CLINICAL IMPRESSION: ?Session continues to focus on functional slideboard transfers, sit<>stand, standing tolerance with improved upright posture and  improving overall extension when in standing.  Pt needing max cues to maintain hip and knee extension throughout only able to maintain for short bursts at a time.  ?  ?OBJECTIVE IMPAIRMENTS decreased activity tolerance, decreased balance, decreased coordination, decreased endurance, decreased knowledge of use of DME, decreased mobility, difficulty walking, decreased ROM, decreased strength, hypomobility, impaired perceived functional ability, impaired flexibility, impaired UE functional use, and postural dysfunction.  ?  ?ACTIVITY LIMITATIONS cleaning, community activity, meal prep, laundry, and medication management.  ?  ?PERSONAL FACTORS Time since onset of injury/illness/exacerbation and 3+ comorbidities: anxiety, OA, cancer, subdural hematoma  are also affecting patient's functional outcome.  ?  ?  ?REHAB POTENTIAL: Fair due to amount of time since onset and previous progress with PT ?  ?CLINICAL DECISION MAKING: Evolving/moderate complexity ?  ?EVALUATION COMPLEXITY: Moderate ?  ?  ?GOALS: ?Goals reviewed with patient? Yes ?  ?SHORT TERM GOALS: Target date: 04/29/2021 ?  ?Pt/caregiver will be IND with HEP in order to indicate improved  functional mobility and improved flexibility. ?Baseline:  ?Goal status: INITIAL ?  ?2.  Pt will perform rolling R at min A level and L at mod/max A level in order to indicate improved strength and independence with bed mobility.  ?Baseline:  ?Goal status: INITIAL ?  ?3.  Pt will perform SL>sit at mod A level in order to indicate improved functional mobility.  ?Baseline:  ?Goal status: INITIAL ?  ?4.  Pt will perform sit>SL/supine at min A level in order to indicate improved functional mobility.   ?Baseline:  ?Goal status: INITIAL ?  ?5.  Pt will perform slideboard transers at max A level in order to indicate improved functional mobility.   ?Baseline:  ?Goal status: INITIAL ?  ?6.  Pt will tolerate standing with use of stedy x 5 mins in order to indicate improved standing tolerance  and improved BLE flexibility.   ?Baseline:  ?Goal status: INITIAL ?  ?7.  Pt will perform dynamic sitting balance reaching outside of BOS x 5 mins in order to indicate improved sitting balance/tolerance.  ?[place]

## 2021-04-21 NOTE — Therapy (Signed)
?OUTPATIENT OCCUPATIONAL THERAPY TREATMENT NOTE ? ? ?Patient Name: Anthony Downs ?MRN: [id number] ?DOB:08/07/1950, 71 y.o., male ?Today's Date: 04/21/2021 ? ?PCP: Redmond School, MD ?REFERRING PROVIDER: [name], MD ? ? OT End of Session - 04/21/21 1329   ? ? Visit Number 6   ? Number of Visits 17   ? Date for OT Re-Evaluation 06/15/21   ? Authorization Type Medicare and UHC   ? Authorization Time Period UHC VL - Need medical auth after 30 visits   ? OT Start Time 1230   ? OT Stop Time 1315   ? OT Time Calculation (min) 45 min   ? Equipment Utilized During Treatment transfer board   ? Activity Tolerance Patient tolerated treatment well   ? Behavior During Therapy [facility] for tasks assessed/performed   ? ?  ?  ? ?  ? ? ? ?Past Medical History:  ?Diagnosis Date  ? Acid reflux   ? Anxiety   ? Arthritis   ? Asthma   ? as a child  ? Basal cell carcinoma 01/26/2021  ? RIGHT POST SURICULAR INFERIOR  ? Basal cell carcinoma 01/26/2021  ? RIGHT POST AURICULAR SUPERIOR  ? Benign brain tumor ([place])   ? Cancer [name])   ? 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, squamous behind ear  ? Complication of anesthesia   ? slow to wake up and pain medicines make him sick  ? GERD (gastroesophageal reflux disease)   ? Hypercholesteremia   ? Hypertension   ? PONV (postoperative nausea and vomiting)   ? Sleep apnea   ? no Cpap  ? 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 fell off truck hit head on concrete  ? ?Past Surgical History:  ?Procedure Laterality Date  ? APPLICATION OF CRANIAL NAVIGATION Left 04/29/2020  ? Procedure: APPLICATION OF CRANIAL NAVIGATION;  Surgeon: Judith Part, MD;  Location: [place];  Service: Neurosurgery;  Laterality: Left;  ? arthroscopic knee    ? COLONOSCOPY N/A 11/29/2018  ? Procedure: COLONOSCOPY;  Surgeon: Rogene Houston, MD;  [facility];  Service: Endoscopy;  Laterality: N/A;  730-rescheduled 9/2 same time per Lelon Frohlich  ? CRANIOTOMY Left 04/29/2020  ? Procedure: LEFT CRANIECTOMY WITH  TUMOR EXCISION;  Surgeon: Judith Part, MD;  Location: [place];  Service: Neurosurgery;  Laterality: Left;  ? KNEE SURGERY Right   ? NASAL SEPTOPLASTY W/ TURBINOPLASTY Bilateral 03/19/2020  ? Procedure: NASAL SEPTOPLASTY WITH BILATERAL  TURBINATE REDUCTION;  Surgeon: Leta Baptist, MD;  Location: [place];  Service: ENT;  Laterality: Bilateral;  ? VASECTOMY    ? ?Patient Active Problem List  ? Diagnosis Date Noted  ? Seizure ([place]) 08/13/2020  ? Seizures ([place]) 08/12/2020  ? 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 DVT (deep venous thrombosis) ([place]) 08/12/2020  ? COVID-19 virus infection 08/12/2020  ? Acute lower UTI 07/14/2020  ? Delirium 07/06/2020  ? Sleep apnea 07/06/2020  ? GERD (gastroesophageal reflux disease) 07/06/2020  ? 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[place]) 06/25/2020  ? Sundowning 06/25/2020  ? Slow transit constipation   ? Essential hypertension   ? Hypokalemia   ? Postoperative pain   ? Meningioma ([place]) 05/02/2020  ? Brain tumor ([place]) 04/28/2020  ? S/P nasal septoplasty 03/19/2020  ? Special screening for malignant neoplasms, colon 01/09/2018  ? ? ?ONSET DATE: 04/28/20 ? ?REFERRING DIAG: Meningioma, Tetraplegia ? ?THERAPY DIAG:  ?Muscle weakness (generalized) ? ?Other lack of coordination ? ?Stiffness of right shoulder, not elsewhere classified ? ?Stiffness of right elbow, not  elsewhere classified ? ?Other disturbances of skin sensation ? ?Abnormal posture ? ?Other symptoms and signs involving the musculoskeletal system ? ? ?PERTINENT HISTORY: traumatic SDH 2008 ? ?PRECAUTIONS: Fall Risk ? ?SUBJECTIVE: Pt reports some pain in back after PT ? ?PAIN:  ?Are you having pain? Yes: NPRS scale: 3-4/10 ?Pain location: back ?Pain description: ache ?Aggravating factors: after PT - standing and transfer ?Relieving factors: tylenol and rest ? ? ? ? ?OBJECTIVE:  ? ?TODAY'S TREATMENT:  ? ?04/21/21 ?Transfer:  Patient able to transfer from power wheelchair to mat table - level surface, or slightly downhill with mod assist moving toward left.   Facilitated sufficient weight shift forward to load LE's and unload hips.  Patient able to assist with lift off of surface, and direct lateral weight shift.   ?Dynamic sitting balance in all planes - forward/backward, lateral, rotational.  Followed with functional low reaching BUE toward feet.  Encouraging exhalation during stretch.   ?Seated lateral stretch with RUE on mat beside patient with fingers and wrist extended.  Worked on reach patterns shoulder flex with elbow extend, and wrist flex/ext.  Use of RUE in hand to mouth pattern in preparation for sipping from a cup.   ? ?04/16/21 ?Pt seated edge of mat upon arrival. Pt worked on reaching to floor while maintaining sitting balance with reaching to around mid shin and sitting back up with SBA. Pt worked on trunk rotation and dynamic balance sitting edge of mat with reaching outside of base of support with unilateral UE support. Pt has good static and dynamic sitting balance. ? ?Transfer via Sliding Board from edge of mat to power chair to left with MAX assistance. ? ?Attempted arm bike however patient has wheelchair lock on bottom preventing from getting close enough. ? ?Dowel exercises while seated in power chair at low level for integrating RUE into closed chain, AAROM, exercises x 10 reps for chest press, shoulder flexion to chest and circumduction ? ? ? ? OT Short Term Goals - 04/01/21 1645   ? ?  ? OT SHORT TERM GOAL #1  ? Title Patient and caregiver will complete HEP designed to improve passive and active range of motion in BUE   ? Time 4   ? Period Weeks   ? Status Ongoing  ? Target Date 05/16/21   ?  ? OT SHORT TERM GOAL #2  ? Title Patient will utilize RUE to assist with at least one ADL task with mod cueing and minimal assistance   ? Time 4   ? Period Weeks   ? Status Ongoing spouse reports encouraging patient to use RUE a lot of the time during ADLs.  ?  ? OT SHORT TERM GOAL #3  ? Title Patient will reach at least mid calf toward feet with BUE in  preparation for participating in LB dressing   ? Time 4   ? Period Weeks   ? Status Ongoing   ?  ? OT SHORT TERM GOAL #4  ? Title Patient will demonstrate sufficient lateral lean left or right in seated position to prepare for placement of sliding board to encourage more active transfers and increase potential for commode transfers.   ? Time 4   ? Period Weeks   ? Status Ongoing  ?  ? OT SHORT TERM GOAL #5  ? Title Pt will be educated on available AE and DME to assist with ADL completion as needed for improved independence.   ? Time 4   ? Status  Ongoing  ? ?  ?  ? ?  ? ? ? OT Long Term Goals - 04/01/21 1652   ? ?  ? OT LONG TERM GOAL #1  ? Title Patient will complete updated HEP designed to improve range, strength, and functional use of BUE   ? Time 8   ? Period Weeks   ? Status New   ? Target Date 06/15/21   ?  ? OT LONG TERM GOAL #2  ? Title Patient will complete an active transfer level surface with slide board toward right with max assistance in preparation for commode transfer   ? Time 8   ? Period Weeks   ? Status New   ?  ? OT LONG TERM GOAL #3  ? Title Patient will demonstrate improved coordination in RUE as evidenced by at least 8 blocks (box in blocks) in one minute   ? Baseline 4   ? Time 8   ? Period Weeks   ? Status New   ?  ? OT LONG TERM GOAL #4  ? Title Patient will demonstrate a 4lb increase in grip strength in RUE   ? Baseline 6.1   ? Time 8   ? Period Weeks   ? Status New   ?  ? OT LONG TERM GOAL #5  ? Title Per patient and wife, patient will complete at least 30% more of bathing and dressing tasks   ? Time 8   ? Period Weeks   ? Status New   ? ?  ?  ? ?  ? ? ? Plan - 04/14/21 1535   ? ? Clinical Impression Statement Pt continues to be limited by pain in RUE but tolerated PROM and AAROM today. Pt worked on closed chain ROM and tolerated with min/mod assistance for support.   ? OT Occupational Profile and History Detailed Assessment- Review of Records and additional review of physical, cognitive,  psychosocial history related to current functional performance   ? Occupational performance deficits (Please refer to evaluation for details): ADL's;IADL's;Leisure   ? Body Structure / Function / Physical Skills

## 2021-04-22 ENCOUNTER — Ambulatory Visit (HOSPITAL_COMMUNITY)
Admission: RE | Admit: 2021-04-22 | Discharge: 2021-04-22 | Disposition: A | Discharge status: Home / Self Care | Payer: Medicare Other | Source: Ambulatory Visit | Attending: Orthopedic Surgery | Admitting: Orthopedic Surgery

## 2021-04-22 ENCOUNTER — Encounter (HOSPITAL_COMMUNITY): Payer: Self-pay

## 2021-04-22 DIAGNOSIS — M25511 Pain in right shoulder: Secondary | ICD-10-CM | POA: Insufficient documentation

## 2021-04-22 DIAGNOSIS — G8929 Other chronic pain: Secondary | ICD-10-CM | POA: Diagnosis present

## 2021-04-22 DIAGNOSIS — M19019 Primary osteoarthritis, unspecified shoulder: Secondary | ICD-10-CM

## 2021-04-22 MED ORDER — IOHEXOL 180 MG/ML  SOLN
20.0000 mL | Freq: Once | INTRAMUSCULAR | Status: AC | PRN
  Administered 2021-04-22: 2 mL via INTRA_ARTICULAR

## 2021-04-22 MED ORDER — POVIDONE-IODINE 10 % EX SOLN
CUTANEOUS | Status: AC
  Administered 2021-04-22: 1
  Filled 2021-04-22: qty 14.8

## 2021-04-22 MED ORDER — METHYLPREDNISOLONE ACETATE 40 MG/ML IJ SUSP
INTRAMUSCULAR | Status: AC
  Administered 2021-04-22: 40 mg
  Filled 2021-04-22: qty 1

## 2021-04-22 MED ORDER — BUPIVACAINE HCL (PF) 0.5 % IJ SOLN
INTRAMUSCULAR | Status: AC
  Administered 2021-04-22: 3 mL
  Filled 2021-04-22: qty 30

## 2021-04-22 MED ORDER — LIDOCAINE HCL (PF) 1 % IJ SOLN
INTRAMUSCULAR | Status: AC
  Administered 2021-04-22: 5 mL
  Filled 2021-04-22: qty 5

## 2021-04-22 NOTE — Procedures (Signed)
Preprocedure Dx: Chronic right shoulder pain ?Postprocedure Dx: Chronic right shoulder pain ?Procedure  Fluoroscopically guided therapeutic RIGHT shoulder joint injection ?Radiologist:  Thornton Papas ?Anesthesia:  3 ml ml of 1% lidocaine ?Injectate:  '40mg'$  DepoMedrol, 3 ml Sensorcaine 0.5% ?Fluoro time:  1 minutes 48 seconds ?EBL:   None ?Complications: None  ?

## 2021-04-23 ENCOUNTER — Encounter: Payer: Self-pay | Admitting: Occupational Therapy

## 2021-04-23 ENCOUNTER — Ambulatory Visit: Payer: Medicare Other

## 2021-04-23 ENCOUNTER — Ambulatory Visit: Payer: Medicare Other | Admitting: Occupational Therapy

## 2021-04-23 DIAGNOSIS — M6281 Muscle weakness (generalized): Secondary | ICD-10-CM

## 2021-04-23 DIAGNOSIS — R278 Other lack of coordination: Secondary | ICD-10-CM

## 2021-04-23 DIAGNOSIS — R293 Abnormal posture: Secondary | ICD-10-CM

## 2021-04-23 DIAGNOSIS — M25621 Stiffness of right elbow, not elsewhere classified: Secondary | ICD-10-CM

## 2021-04-23 DIAGNOSIS — R29898 Other symptoms and signs involving the musculoskeletal system: Secondary | ICD-10-CM

## 2021-04-23 DIAGNOSIS — R2689 Other abnormalities of gait and mobility: Secondary | ICD-10-CM

## 2021-04-23 DIAGNOSIS — R208 Other disturbances of skin sensation: Secondary | ICD-10-CM

## 2021-04-23 DIAGNOSIS — M25611 Stiffness of right shoulder, not elsewhere classified: Secondary | ICD-10-CM

## 2021-04-23 NOTE — Therapy (Signed)
?OUTPATIENT OCCUPATIONAL THERAPY TREATMENT NOTE ? ? ?Patient Name: Anthony Downs ?MRN: [id number] ?DOB:20-Mar-1950, 71 y.o., male ?Today's Date: 04/23/2021 ? ?PCP: Redmond School, MD ?REFERRING PROVIDER: Redmond School, MD ? ? OT End of Session - 04/23/21 1330   ? ? Visit Number 7   ? Number of Visits 17   ? Date for OT Re-Evaluation 06/15/21   ? Authorization Type Medicare and UHC   ? Authorization Time Period UHC VL - Need medical auth after 30 visits   ? OT Start Time 1230   ? OT Stop Time 1315   ? OT Time Calculation (min) 45 min   ? Equipment Utilized During Best boy, reacher   ? Activity Tolerance Patient tolerated treatment well   ? Behavior During Therapy [facility] for tasks assessed/performed   ? ?  ?  ? ?  ? ? ? ?Past Medical History:  ?Diagnosis Date  ? Acid reflux   ? Anxiety   ? Arthritis   ? Asthma   ? as a child  ? Basal cell carcinoma 01/26/2021  ? RIGHT POST SURICULAR INFERIOR  ? Basal cell carcinoma 01/26/2021  ? RIGHT POST AURICULAR SUPERIOR  ? Benign brain tumor ([place])   ? Cancer [facility])   ? 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, squamous behind ear  ? Complication of anesthesia   ? slow to wake up and pain medicines make him sick  ? GERD (gastroesophageal reflux disease)   ? Hypercholesteremia   ? Hypertension   ? PONV (postoperative nausea and vomiting)   ? Sleep apnea   ? no Cpap  ? 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 fell off truck hit head on concrete  ? ?Past Surgical History:  ?Procedure Laterality Date  ? APPLICATION OF CRANIAL NAVIGATION Left 04/29/2020  ? Procedure: APPLICATION OF CRANIAL NAVIGATION;  Surgeon: Judith Part, MD;  Location: [place];  Service: Neurosurgery;  Laterality: Left;  ? arthroscopic knee    ? COLONOSCOPY N/A 11/29/2018  ? Procedure: COLONOSCOPY;  Surgeon: Rogene Houston, MD;  [facility];  Service: Endoscopy;  Laterality: N/A;  730-rescheduled 9/2 same time per Lelon Frohlich  ? CRANIOTOMY Left 04/29/2020  ? Procedure: LEFT CRANIECTOMY  WITH TUMOR EXCISION;  Surgeon: Judith Part, MD;  Location: [place];  Service: Neurosurgery;  Laterality: Left;  ? KNEE SURGERY Right   ? NASAL SEPTOPLASTY W/ TURBINOPLASTY Bilateral 03/19/2020  ? Procedure: NASAL SEPTOPLASTY WITH BILATERAL  TURBINATE REDUCTION;  Surgeon: Leta Baptist, MD;  Location: [place];  Service: ENT;  Laterality: Bilateral;  ? VASECTOMY    ? ?Patient Active Problem List  ? Diagnosis Date Noted  ? Seizure ([place]) 08/13/2020  ? Seizures ([place]) 08/12/2020  ? 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 DVT (deep venous thrombosis) ([place]) 08/12/2020  ? COVID-19 virus infection 08/12/2020  ? Acute lower UTI 07/14/2020  ? Delirium 07/06/2020  ? Sleep apnea 07/06/2020  ? GERD (gastroesophageal reflux disease) 07/06/2020  ? 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[place]) 06/25/2020  ? Sundowning 06/25/2020  ? Slow transit constipation   ? Essential hypertension   ? Hypokalemia   ? Postoperative pain   ? Meningioma ([place]) 05/02/2020  ? Brain tumor ([place]) 04/28/2020  ? S/P nasal septoplasty 03/19/2020  ? Special screening for malignant neoplasms, colon 01/09/2018  ? ? ?ONSET DATE: 04/28/20 ? ?REFERRING DIAG: Meningioma, Tetraplegia ? ?THERAPY DIAG:  ?Muscle weakness (generalized) ? ?Other lack of coordination ? ?Stiffness of right shoulder, not elsewhere classified ? ?Stiffness of right elbow, not  elsewhere classified ? ?Other disturbances of skin sensation ? ?Abnormal posture ? ?Other symptoms and signs involving the musculoskeletal system ? ? ?PERTINENT HISTORY: traumatic SDH 2008 ? ?PRECAUTIONS: Fall Risk ? ?SUBJECTIVE: Pt reports some pain in back after PT ? ?PAIN:  ?Are you having pain? Yes: NPRS scale: 3-4/10 ?Pain location: back ?Pain description: ache ?Aggravating factors: after PT - standing and transfer ?Relieving factors: tylenol and rest ? ? ? ? ?OBJECTIVE:  ? ?TODAY'S TREATMENT:  ?04/23/21:  Patient on mat at start of session reporting feeling tired after PT session.  Worked in [place] on standing with more erect posture and  decreased reliance on UE's for standing - with ultimate goal of having patient able to help pull up/down LB clothing.   ?Worked on standing and reaching high with left UE using only RUE as support.  Patient able to sustain "standing" for 15 seconds while using hands in reach/place task before needing a rest.  ?Also worked on grasp/pinch/release and reaching pattenrs in [place] while in high prop position in [place].   ?Seated without support worked on [place].  Patient able to don/doff shorts over feet with support.  Patient showing active movement in RLE - movement is delayed and minimal, but is improvement over statues at evaluation.  Patient used right hand effectively with reacher.   ?Transferred toward right with slide board  assist only to manage alignment of RLE.   ? ?04/21/21 ?Transfer:  Patient able to transfer from power wheelchair to mat table - level surface, or slightly downhill with mod assist moving toward left.  Facilitated sufficient weight shift forward to load LE's and unload hips.  Patient able to assist with lift off of surface, and direct lateral weight shift.   ?Dynamic sitting balance in all planes - forward/backward, lateral, rotational.  Followed with functional low reaching BUE toward feet.  Encouraging exhalation during stretch.   ?Seated lateral stretch with RUE on mat beside patient with fingers and wrist extended.  Worked on reach patterns shoulder flex with elbow extend, and wrist flex/ext.  Use of RUE in hand to mouth pattern in preparation for sipping from a cup.   ? ? ? ?Transfer via Sliding Board from edge of mat to power chair to left with MAX assistance. ? ?Attempted arm bike however patient has wheelchair lock on bottom preventing from getting close enough. ? ?Dowel exercises while seated in power chair at low level for integrating RUE into closed chain, AAROM, exercises x 10 reps for chest press, shoulder flexion to chest and circumduction ? ? ? ? OT Short  Term Goals - 04/01/21 1645   ? ?  ? OT SHORT TERM GOAL #1  ? Title Patient and caregiver will complete HEP designed to improve passive and active range of motion in BUE   ? Time 4   ? Period Weeks   ? Status Ongoing  ? Target Date 05/16/21   ?  ? OT SHORT TERM GOAL #2  ? Title Patient will utilize RUE to assist with at least one ADL task with mod cueing and minimal assistance   ? Time 4   ? Period Weeks   ? Status Ongoing spouse reports encouraging patient to use RUE a lot of the time during ADLs.  ?  ? OT SHORT TERM GOAL #3  ? Title Patient will reach at least mid calf toward feet with BUE in preparation for participating in LB dressing   ? Time 4   ?  Period Weeks   ? Status Ongoing   ?  ? OT SHORT TERM GOAL #4  ? Title Patient will demonstrate sufficient lateral lean left or right in seated position to prepare for placement of sliding board to encourage more active transfers and increase potential for commode transfers.   ? Time 4   ? Period Weeks   ? Status Ongoing  ?  ? OT SHORT TERM GOAL #5  ? Title Pt will be educated on available AE and DME to assist with ADL completion as needed for improved independence.   ? Time 4   ? Status Ongoing  ? ?  ?  ? ?  ? ? ? OT Long Term Goals - 04/01/21 1652   ? ?  ? OT LONG TERM GOAL #1  ? Title Patient will complete updated HEP designed to improve range, strength, and functional use of BUE   ? Time 8   ? Period Weeks   ? Status New   ? Target Date 06/15/21   ?  ? OT LONG TERM GOAL #2  ? Title Patient will complete an active transfer level surface with slide board toward right with max assistance in preparation for commode transfer   ? Time 8   ? Period Weeks   ? Status New   ?  ? OT LONG TERM GOAL #3  ? Title Patient will demonstrate improved coordination in RUE as evidenced by at least 8 blocks (box in blocks) in one minute   ? Baseline 4   ? Time 8   ? Period Weeks   ? Status New   ?  ? OT LONG TERM GOAL #4  ? Title Patient will demonstrate a 4lb increase in grip strength  in RUE   ? Baseline 6.1   ? Time 8   ? Period Weeks   ? Status New   ?  ? OT LONG TERM GOAL #5  ? Title Per patient and wife, patient will complete at least 30% more of bathing and dressing tasks   ? Time 8

## 2021-04-23 NOTE — Therapy (Signed)
?OUTPATIENT PHYSICAL THERAPY TREATMENT NOTE ? ? ?Patient Name: Anthony Downs ?MRN: [id number] ?DOB:[birth year], 71 y.o., male ?Today's Date: 04/23/2021 ? ?PCP: Redmond School, MD ?REFERRING PROVIDER: [name], MD  ? ? PT End of Session - 04/23/21 1149   ? ? Visit Number 7   ? Number of Visits 17   ? Date for PT Re-Evaluation 05/31/21   ? Authorization Type Medicare and UHC (needs 10th visit progress note)   ? Progress Note Due on Visit 10   ? PT Start Time 1147   ? PT Stop Time 1232   ? PT Time Calculation (min) 45 min   ? Equipment Utilized During Treatment Other (comment);Gait belt   stedy for transfers, slideboard  ? Activity Tolerance Patient tolerated treatment well   ? Behavior During Therapy [facility] for tasks assessed/performed   ? ?  ?  ? ?  ? ? ? ? ? ?Past Medical History:  ?Diagnosis Date  ? Acid reflux   ? Anxiety   ? Arthritis   ? Asthma   ? as a child  ? Basal cell carcinoma 01/26/2021  ? RIGHT POST SURICULAR INFERIOR  ? Basal cell carcinoma 01/26/2021  ? RIGHT POST AURICULAR SUPERIOR  ? Benign brain tumor ([place])   ? Cancer [facility])   ? 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, squamous behind ear  ? Complication of anesthesia   ? slow to wake up and pain medicines make him sick  ? GERD (gastroesophageal reflux disease)   ? Hypercholesteremia   ? Hypertension   ? PONV (postoperative nausea and vomiting)   ? Sleep apnea   ? no Cpap  ? 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 fell off truck hit head on concrete  ? ?Past Surgical History:  ?Procedure Laterality Date  ? APPLICATION OF CRANIAL NAVIGATION Left 04/29/2020  ? Procedure: APPLICATION OF CRANIAL NAVIGATION;  Surgeon: Judith Part, MD;  Location: [place];  Service: Neurosurgery;  Laterality: Left;  ? arthroscopic knee    ? COLONOSCOPY N/A 11/29/2018  ? Procedure: COLONOSCOPY;  Surgeon: Rogene Houston, MD;  [facility];  Service: Endoscopy;  Laterality: N/A;  730-rescheduled 9/2 same time per Lelon Frohlich  ? CRANIOTOMY Left 04/29/2020   ? Procedure: LEFT CRANIECTOMY WITH TUMOR EXCISION;  Surgeon: Judith Part, MD;  Location: [place];  Service: Neurosurgery;  Laterality: Left;  ? KNEE SURGERY Right   ? NASAL SEPTOPLASTY W/ TURBINOPLASTY Bilateral 03/19/2020  ? Procedure: NASAL SEPTOPLASTY WITH BILATERAL  TURBINATE REDUCTION;  Surgeon: Leta Baptist, MD;  Location: [place];  Service: ENT;  Laterality: Bilateral;  ? VASECTOMY    ? ?Patient Active Problem List  ? Diagnosis Date Noted  ? Seizure ([place]) 08/13/2020  ? Seizures ([place]) 08/12/2020  ? 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 DVT (deep venous thrombosis) ([place]) 08/12/2020  ? COVID-19 virus infection 08/12/2020  ? Acute lower UTI 07/14/2020  ? Delirium 07/06/2020  ? Sleep apnea 07/06/2020  ? GERD (gastroesophageal reflux disease) 07/06/2020  ? 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[place]) 06/25/2020  ? Sundowning 06/25/2020  ? Slow transit constipation   ? Essential hypertension   ? Hypokalemia   ? Postoperative pain   ? Meningioma ([place]) 05/02/2020  ? Brain tumor ([place]) 04/28/2020  ? S/P nasal septoplasty 03/19/2020  ? Special screening for malignant neoplasms, colon 01/09/2018  ? ? ?REFERRING DIAG: D32.9 (ICD-10-CM) - Meningioma ([place]) G82.50 (ICD-10-CM) - 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[place]) R56.9 (ICD-10-CM) - Seizures ([place])   ? ?THERAPY DIAG:  ?Other abnormalities of  gait and mobility ? ?Abnormal posture ? ?Muscle weakness (generalized) ? ?PERTINENT HISTORY: anxiety, OA, cancer, subdural hematoma  ?  ? ?PRECAUTIONS: Fall ? ?SUBJECTIVE: Pt reports that he had an injection in right shoulder yesterday. Didn't sleep well last night probably due to that. It was his 3rd time. Shoulder feeling a little better. ? ?PAIN:  ?Are you having pain? No ? ? ?OBJECTIVE:  ?  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 There are mild chronic small-vessel ischemic ?changes of the white matter. There is atrophy, encephalomalacia and ?gliosis along the medial aspects  of both sides of the brain at the ?frontoparietal vertex, sequela of the previous tumor and subsequent ?resection. It appears that the midportion of the superior sagittal ?sinus has been resected. No evidence of tumor in the venous sinuses ?anterior or posterior to that. ?  ? ?   ?  ?TODAY'S TREATMENT:  ?04/23/21: ?Slideboard transfer: max assist with 4" step under feet, verbal and tactile cues to use left arm to scoot and lean head away to help with bottom coming over. ? ?Sitting edge of mat: coming down on left forearm to side x 2 and back up then reaching across for PT hand  with right hand x 2. Switched to coming down on right forearm with about 25% weight and reaching for targets across body with left arm x 5. Seated reaching across body x 5 each side working on increasing trunk rotation. ? ?PROM: tested seated ?Right ankle DF=-27 ?Left ankle DF=-32 ?Right knee ext=-14 ?Left knee ext=-25 ? ?Standing in [place] x 3 bouts just over a minute each. Pt able to pull to stand on Stedy CGA. In standing verbal and tactile cues for upright posture to stick out chest and pull in bottom. Reaching with left arm on last bout and looking to left at times to decrease UE support. Pt fatigued as went on. ? ?PATIENT EDUCATION: ? ?  ?  ?HOME EXERCISE PROGRAM: ?[email or web address] ?  ?ASSESSMENT: ?  ?CLINICAL IMPRESSION: ?Pt was max assist with slideboard going to left from w/c to mat today. Difficulty with using head/hips ratio to initiate movement. Did well maintaining sitting balance edge of mat with activities. Able to get more upright posture with standing in [place] not pushing on shin blockers while up. Does fatigue with longer standing. Added LTG for knee and ankle ROM. ?  ?OBJECTIVE IMPAIRMENTS decreased activity tolerance, decreased balance, decreased coordination, decreased endurance, decreased knowledge of use of DME, decreased mobility, difficulty walking,  decreased ROM, decreased strength, hypomobility, impaired perceived functional ability, impaired flexibility, impaired UE functional use, and postural dysfunction.  ?  ?ACTIVITY LIMITATIONS cleaning, community activity, meal prep, laundry, and medication management.  ?  ?PERSONAL FACTORS Time since onset of injury/illness/exacerbation and 3+ comorbidities: anxiety, OA, cancer, subdural hematoma  are also affecting patient's functional outcome.  ?  ?  ?REHAB POTENTIAL: Fair due to amount of time since onset and previous progress with PT ?  ?CLINICAL DECISION MAKING: Evolving/moderate complexity ?  ?EVALUATION COMPLEXITY: Moderate ?  ?  ?GOALS: ?Goals reviewed with patient? Yes ?  ?SHORT TERM GOALS: Target date: 04/29/2021 ?  ?Pt/caregiver will be IND with HEP in order to indicate improved functional mobility and improved flexibility. ?Baseline:  ?Goal status: INITIAL ?  ?2.  Pt will perform rolling R at min A level and L at mod/max A level in order to indicate improved strength and independence with  bed mobility.  ?Baseline:  ?Goal status: INITIAL ?  ?3.  Pt will perform SL>sit at mod A level in order to indicate improved functional mobility.  ?Baseline:  ?Goal status: INITIAL ?  ?4.  Pt will perform sit>SL/supine at min A level in order to indicate improved functional mobility.   ?Baseline:  ?Goal status: INITIAL ?  ?5.  Pt will perform slideboard transers at max A level in order to indicate improved functional mobility.   ?Baseline:  ?Goal status: INITIAL ?  ?6.  Pt will tolerate standing with use of stedy x 5 mins in order to indicate improved standing tolerance and improved BLE flexibility.   ?Baseline:  ?Goal status: INITIAL ?  ?7.  Pt will perform dynamic sitting balance reaching outside of BOS x 5 mins in order to indicate improved sitting balance/tolerance.  ?Baseline:  ?Goal status: INITIAL ?  ?LONG TERM GOALS: Target date: 05/27/2021 ?  ?Pt/caregiver will be IND with final HEP in order to indicate improved  functional mobility and dec fall risk. ?Baseline:  ?Goal status: INITIAL ?  ?2.  Pt will perform rolling R at S level and L at min/mod A level in order to indicate improved functional mobility.  ?Baseline:

## 2021-04-27 ENCOUNTER — Ambulatory Visit: Payer: Medicare Other | Admitting: Occupational Therapy

## 2021-04-27 ENCOUNTER — Encounter: Payer: Self-pay | Admitting: Occupational Therapy

## 2021-04-27 ENCOUNTER — Ambulatory Visit: Payer: Medicare Other | Admitting: Physical Therapy

## 2021-04-27 DIAGNOSIS — M6281 Muscle weakness (generalized): Secondary | ICD-10-CM | POA: Diagnosis not present

## 2021-04-27 DIAGNOSIS — R208 Other disturbances of skin sensation: Secondary | ICD-10-CM

## 2021-04-27 DIAGNOSIS — M25621 Stiffness of right elbow, not elsewhere classified: Secondary | ICD-10-CM

## 2021-04-27 DIAGNOSIS — R29898 Other symptoms and signs involving the musculoskeletal system: Secondary | ICD-10-CM

## 2021-04-27 DIAGNOSIS — R278 Other lack of coordination: Secondary | ICD-10-CM

## 2021-04-27 DIAGNOSIS — R293 Abnormal posture: Secondary | ICD-10-CM

## 2021-04-27 DIAGNOSIS — M25611 Stiffness of right shoulder, not elsewhere classified: Secondary | ICD-10-CM

## 2021-04-27 DIAGNOSIS — R2689 Other abnormalities of gait and mobility: Secondary | ICD-10-CM

## 2021-04-27 NOTE — Therapy (Signed)
?OUTPATIENT OCCUPATIONAL THERAPY TREATMENT NOTE ? ? ?Patient Name: Anthony Downs ?MRN: [id number] ?DOB:11-Jun-1950, 71 y.o., male ?Today's Date: 04/27/2021 ? ?PCP: Redmond School, MD ?REFERRING PROVIDER: Redmond School, MD ? ? OT End of Session - 04/27/21 1446   ? ? Visit Number 8   ? Number of Visits 17   ? Date for OT Re-Evaluation 06/15/21   ? Authorization Type Medicare and UHC   ? Authorization Time Period UHC VL - Need medical auth after 30 visits   ? OT Start Time 4193   ? OT Stop Time 1530   ? OT Time Calculation (min) 45 min   ? Equipment Utilized During Best boy, reacher   ? Activity Tolerance Patient tolerated treatment well   ? Behavior During Therapy [facility] for tasks assessed/performed   ? ?  ?  ? ?  ? ? ? ?Past Medical History:  ?Diagnosis Date  ? Acid reflux   ? Anxiety   ? Arthritis   ? Asthma   ? as a child  ? Basal cell carcinoma 01/26/2021  ? RIGHT POST SURICULAR INFERIOR  ? Basal cell carcinoma 01/26/2021  ? RIGHT POST AURICULAR SUPERIOR  ? Benign brain tumor ([place])   ? Cancer [facility])   ? 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, squamous behind ear  ? Complication of anesthesia   ? slow to wake up and pain medicines make him sick  ? GERD (gastroesophageal reflux disease)   ? Hypercholesteremia   ? Hypertension   ? PONV (postoperative nausea and vomiting)   ? Sleep apnea   ? no Cpap  ? 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 fell off truck hit head on concrete  ? ?Past Surgical History:  ?Procedure Laterality Date  ? APPLICATION OF CRANIAL NAVIGATION Left 04/29/2020  ? Procedure: APPLICATION OF CRANIAL NAVIGATION;  Surgeon: Judith Part, MD;  Location: [place];  Service: Neurosurgery;  Laterality: Left;  ? arthroscopic knee    ? COLONOSCOPY N/A 11/29/2018  ? Procedure: COLONOSCOPY;  Surgeon: Rogene Houston, MD;  [facility];  Service: Endoscopy;  Laterality: N/A;  730-rescheduled 9/2 same time per Lelon Frohlich  ? CRANIOTOMY Left 04/29/2020  ? Procedure: LEFT CRANIECTOMY  WITH TUMOR EXCISION;  Surgeon: Judith Part, MD;  Location: [place];  Service: Neurosurgery;  Laterality: Left;  ? KNEE SURGERY Right   ? NASAL SEPTOPLASTY W/ TURBINOPLASTY Bilateral 03/19/2020  ? Procedure: NASAL SEPTOPLASTY WITH BILATERAL  TURBINATE REDUCTION;  Surgeon: Leta Baptist, MD;  Location: [place];  Service: ENT;  Laterality: Bilateral;  ? VASECTOMY    ? ?Patient Active Problem List  ? Diagnosis Date Noted  ? Seizure ([place]) 08/13/2020  ? Seizures ([place]) 08/12/2020  ? 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 DVT (deep venous thrombosis) ([place]) 08/12/2020  ? COVID-19 virus infection 08/12/2020  ? Acute lower UTI 07/14/2020  ? Delirium 07/06/2020  ? Sleep apnea 07/06/2020  ? GERD (gastroesophageal reflux disease) 07/06/2020  ? 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[place]) 06/25/2020  ? Sundowning 06/25/2020  ? Slow transit constipation   ? Essential hypertension   ? Hypokalemia   ? Postoperative pain   ? Meningioma ([place]) 05/02/2020  ? Brain tumor ([place]) 04/28/2020  ? S/P nasal septoplasty 03/19/2020  ? Special screening for malignant neoplasms, colon 01/09/2018  ? ? ?ONSET DATE: 04/28/20 ? ?REFERRING DIAG: Meningioma, Tetraplegia ? ?THERAPY DIAG:  ?Muscle weakness (generalized) ? ?Other abnormalities of gait and mobility ? ?Other lack of coordination ? ?Stiffness of right shoulder, not elsewhere  classified ? ?Stiffness of right elbow, not elsewhere classified ? ?Other disturbances of skin sensation ? ?Other symptoms and signs involving the musculoskeletal system ? ? ?PERTINENT HISTORY: traumatic SDH 2008 ? ?PRECAUTIONS: Fall Risk ? ?SUBJECTIVE: Pt reported some back pain from sitting edge of mat.  ? ?PAIN:  ?Are you having pain? No ? ? ? ? ?OBJECTIVE:  ? ?TODAY'S TREATMENT:  ? ?04/27/21: ?Pt was on mat at start of session from PT session. Pt seated edge of mat. Pt was not wearing his shoes and OT addressed LB dressing and donning shoes with use of shoe horn. Pt attempted LLE foot with donning slip on shoes with assistance req'd for set up and  min A for heel. Pt unable to lift RLE high enough for donning Rt shoe. ? ?Toileting - pt reports using BSC successfully with use of Stedy for transfer and standing for hygiene and clothing management with assistance from spouse for hygiene and clothing management and transfer assistance. Pt is also successfully using urinal independently and transitioning out of brief wear and into standard boxer shorts throughout the day.  ? ?Coordination - with RUE with color dowel/pegs with mod difficulty. Pt was able to manipulate pegs with increased coordination and control. Minimal drops. Increased time. ? ?Transfer: pt transferred with sliding board to the left with decline from mat to power chair and step for BLE placement for support and stability for transfer. Pt was able to elevate off of mat and slide with anterior leaning and holding on to chair with minimal to moderate assistance throughout transfer.  ? ? ? ? ? ? ? OT Short Term Goals - 04/01/21 1645   ? ?  ? OT SHORT TERM GOAL #1  ? Title Patient and caregiver will complete HEP designed to improve passive and active range of motion in BUE   ? Time 4   ? Period Weeks   ? Status Ongoing  ? Target Date 05/16/21   ?  ? OT SHORT TERM GOAL #2  ? Title Patient will utilize RUE to assist with at least one ADL task with mod cueing and minimal assistance   ? Time 4   ? Period Weeks   ? Status Ongoing spouse reports encouraging patient to use RUE a lot of the time during ADLs.  ?  ? OT SHORT TERM GOAL #3  ? Title Patient will reach at least mid calf toward feet with BUE in preparation for participating in LB dressing   ? Time 4   ? Period Weeks   ? Status Ongoing   ?  ? OT SHORT TERM GOAL #4  ? Title Patient will demonstrate sufficient lateral lean left or right in seated position to prepare for placement of sliding board to encourage more active transfers and increase potential for commode transfers.   ? Time 4   ? Period Weeks   ? Status Ongoing  ?  ? OT SHORT TERM GOAL #5  ?  Title Pt will be educated on available AE and DME to assist with ADL completion as needed for improved independence.   ? Time 4   ? Status Ongoing  ? ?  ?  ? ?  ? ? ? OT Long Term Goals - 04/01/21 1652   ? ?  ? OT LONG TERM GOAL #1  ? Title Patient will complete updated HEP designed to improve range, strength, and functional use of BUE   ? Time 8   ? Period Weeks   ?  Status New   ? Target Date 06/15/21   ?  ? OT LONG TERM GOAL #2  ? Title Patient will complete an active transfer level surface with slide board toward right with max assistance in preparation for commode transfer   ? Time 8   ? Period Weeks   ? Status New   ?  ? OT LONG TERM GOAL #3  ? Title Patient will demonstrate improved coordination in RUE as evidenced by at least 8 blocks (box in blocks) in one minute   ? Baseline 4   ? Time 8   ? Period Weeks   ? Status New   ?  ? OT LONG TERM GOAL #4  ? Title Patient will demonstrate a 4lb increase in grip strength in RUE   ? Baseline 6.1   ? Time 8   ? Period Weeks   ? Status New   ?  ? OT LONG TERM GOAL #5  ? Title Per patient and wife, patient will complete at least 30% more of bathing and dressing tasks   ? Time 8   ? Period Weeks   ? Status New   ? ?  ?  ? ?  ? ? ? Plan - 04/14/21 1535   ? ? Clinical Impression Statement Pt reporting increased back pain with sitting edge of mat today d/t fatigue. Pt with increased coordination in RUE and with decreased pain in shoulder. Pt with progress towards increased independence with ADLs and goals.  ? OT Occupational Profile and History Detailed Assessment- Review of Records and additional review of physical, cognitive, psychosocial history related to current functional performance   ? Occupational performance deficits (Please refer to evaluation for details): ADL's;IADL's;Leisure   ? Body Structure / Function / Physical Skills ADL;Decreased knowledge of use of DME;Gait;Obesity;Strength;Tone;GMC;Dexterity;Balance;Body mechanics;Edema;Proprioception;UE functional  use;Cardiopulmonary status limiting activity;Endurance;IADL;ROM;Continence;Coordination;Flexibility;Mobility;Sensation;FMC;Decreased knowledge of precautions;Muscle spasms   ? Rehab Potential Good   ? Clinic

## 2021-04-27 NOTE — Therapy (Signed)
?OUTPATIENT PHYSICAL THERAPY TREATMENT NOTE ? ? ?Patient Name: Anthony Downs ?MRN: [id number] ?DOB:[birth year], 71 y.o., male ?Today's Date: 04/27/2021 ? ?PCP: Redmond School, MD ?REFERRING PROVIDER: [name], MD  ? ? PT End of Session - 04/27/21 1405   ? ? Visit Number 8   ? Number of Visits 17   ? Date for PT Re-Evaluation 05/31/21   ? Authorization Type Medicare and UHC (needs 10th visit progress note)   ? Progress Note Due on Visit 10   ? PT Start Time 4782   ? PT Stop Time 9562   ? PT Time Calculation (min) 42 min   ? Equipment Utilized During Treatment Other (comment);Gait belt   slideboard  ? Activity Tolerance Patient tolerated treatment well   ? Behavior During Therapy [facility] for tasks assessed/performed   ? ?  ?  ? ?  ? ? ? ? ? ? ?Past Medical History:  ?Diagnosis Date  ? Acid reflux   ? Anxiety   ? Arthritis   ? Asthma   ? as a child  ? Basal cell carcinoma 01/26/2021  ? RIGHT POST SURICULAR INFERIOR  ? Basal cell carcinoma 01/26/2021  ? RIGHT POST AURICULAR SUPERIOR  ? Benign brain tumor ([place])   ? Cancer [facility])   ? 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, squamous behind ear  ? Complication of anesthesia   ? slow to wake up and pain medicines make him sick  ? GERD (gastroesophageal reflux disease)   ? Hypercholesteremia   ? Hypertension   ? PONV (postoperative nausea and vomiting)   ? Sleep apnea   ? no Cpap  ? 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 fell off truck hit head on concrete  ? ?Past Surgical History:  ?Procedure Laterality Date  ? APPLICATION OF CRANIAL NAVIGATION Left 04/29/2020  ? Procedure: APPLICATION OF CRANIAL NAVIGATION;  Surgeon: Judith Part, MD;  Location: [place];  Service: Neurosurgery;  Laterality: Left;  ? arthroscopic knee    ? COLONOSCOPY N/A 11/29/2018  ? Procedure: COLONOSCOPY;  Surgeon: Rogene Houston, MD;  [facility];  Service: Endoscopy;  Laterality: N/A;  730-rescheduled 9/2 same time per Lelon Frohlich  ? CRANIOTOMY Left 04/29/2020  ? Procedure: LEFT  CRANIECTOMY WITH TUMOR EXCISION;  Surgeon: Judith Part, MD;  Location: [place];  Service: Neurosurgery;  Laterality: Left;  ? KNEE SURGERY Right   ? NASAL SEPTOPLASTY W/ TURBINOPLASTY Bilateral 03/19/2020  ? Procedure: NASAL SEPTOPLASTY WITH BILATERAL  TURBINATE REDUCTION;  Surgeon: Leta Baptist, MD;  Location: [place];  Service: ENT;  Laterality: Bilateral;  ? VASECTOMY    ? ?Patient Active Problem List  ? Diagnosis Date Noted  ? Seizure ([place]) 08/13/2020  ? Seizures ([place]) 08/12/2020  ? 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 DVT (deep venous thrombosis) ([place]) 08/12/2020  ? COVID-19 virus infection 08/12/2020  ? Acute lower UTI 07/14/2020  ? Delirium 07/06/2020  ? Sleep apnea 07/06/2020  ? GERD (gastroesophageal reflux disease) 07/06/2020  ? 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[place]) 06/25/2020  ? Sundowning 06/25/2020  ? Slow transit constipation   ? Essential hypertension   ? Hypokalemia   ? Postoperative pain   ? Meningioma ([place]) 05/02/2020  ? Brain tumor ([place]) 04/28/2020  ? S/P nasal septoplasty 03/19/2020  ? Special screening for malignant neoplasms, colon 01/09/2018  ? ? ?REFERRING DIAG: D32.9 (ICD-10-CM) - Meningioma ([place]) G82.50 (ICD-10-CM) - 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[place]) R56.9 (ICD-10-CM) - Seizures ([place])   ? ?THERAPY DIAG:  ?Other abnormalities of gait and  mobility ? ?Abnormal posture ? ?Muscle weakness (generalized) ? ?PERTINENT HISTORY: anxiety, OA, cancer, subdural hematoma  ?  ? ?PRECAUTIONS: Fall ? ?SUBJECTIVE: Pt reports having a good weekend, has been working on improved standing tolerance.  ? ?PAIN:  ?Are you having pain? No ? ? ?OBJECTIVE:  ?  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 There are mild chronic small-vessel ischemic ?changes of the white matter. There is atrophy, encephalomalacia and ?gliosis along the medial aspects of both sides of the brain at the ?frontoparietal vertex, sequela of the previous tumor and  subsequent ?resection. It appears that the midportion of the superior sagittal ?sinus has been resected. No evidence of tumor in the venous sinuses ?anterior or posterior to that. ?    ?  ?TODAY'S TREATMENT:  ?Gait Training  ?In // bars, pt performed sit <>stand from power chair w/min A for L knee block and glute extension and held stand for 5 minutes and 10s w/BUE support on bars in front of mirror for visual biofeedback on body position. Pt required mod-max encouraging verbal cues to maintain stand and to maintain upright posture/bilateral knee extension. Pt continues to demonstrate bilateral knee varus, maintained bilateral hip/knee flexion in stance and heavy reliance on BUEs in stance.  ? ?NMR ?Pt performed lateral slide board transfer from power chair to low mat table on L side w/min A for improved hip clearance and foot placement, min cues for sequencing, larger amplitude scoots and hand placement. Board placed under pt w/total A.  ? ?Sit > sidelying > supine w/min A for assistance w/RLE. Instructed pt on hooking LLE posterior to RLE and using LLE to move RLE, pt able to raise BLEs about 2 inches from top of mat w/shoes on. Once sidelying, pt able to roll onto back independently. Pt's wife removed shoes per pt's request and pt performed roll to R side w/min A for assistance at pelvis, mod cues for increased momentum w/reaching w/LUE towards R side to roll.  ? ?Supine > L sidelying w/mod A for RLE placement and assistance at pelvis, max multimodal cues for BUE placement and use of momentum. L sidelying > sit EOM w/mod A for BLE management and minor trunk support. Pt requires assistance to kick RLE off mat.  ? ? ?PATIENT EDUCATION: ?Education details: Practicing rolling L & R and supine >sidelying at home, continue HEP, goal assessment  ?Person educated: Patient and Spouse ?Education method: Explanation, Demonstration, Tactile cues, and Verbal cues ?Education comprehension: verbalized understanding and needs  further education ? ?  ?HOME EXERCISE PROGRAM: ?[email or web address] ?  ?ASSESSMENT: ?  ?CLINICAL IMPRESSION: ?Emphasis of skilled PT session on STG assessment. Pt has met 5 of 6 STGs, not fully meeting 6th goal due to poor core control in seated position. Pt able to stand for >5 minutes in // bars w/BUE support and min cues for bilateral knee extension. Pt requires min A for RLE management during rolling and bed transfers but is encouraged to practice independence with transfers at home for improved R hemibody strength and body mechanics w/gait training. Continue POC.  ?  ?OBJECTIVE IMPAIRMENTS decreased activity tolerance, decreased balance, decreased coordination, decreased endurance, decreased knowledge of use of DME, decreased mobility, difficulty walking, decreased ROM, decreased strength, hypomobility, impaired perceived functional ability, impaired flexibility, impaired UE functional use, and postural dysfunction.  ?  ?ACTIVITY LIMITATIONS cleaning, community activity, meal prep, laundry, and medication management.  ?  ?PERSONAL  FACTORS Time since onset of injury/illness/exacerbation and 3+ comorbidities: anxiety, OA, cancer, subdural hematoma  are also affecting patient's functional outcome.  ?  ?  ?REHAB POTENTIAL: Fair due to amount of time since onset and previous progress with PT ?  ?CLINICAL DECISION MAKING: Evolving/moderate complexity ?  ?EVALUATION COMPLEXITY: Moderate ?  ?  ?GOALS: ?Goals reviewed with patient? Yes ?  ?SHORT TERM GOALS: Target date: 04/29/2021 ?  ?Pt/caregiver will be IND with HEP in order to indicate improved functional mobility and improved flexibility. ?Baseline:  ?Goal status: MET ?  ?2.  Pt will perform rolling R at min A level and L at mod/max A level in order to indicate improved strength and independence with bed mobility.  ?Baseline:  ?Goal status: MET ?  ?3.  Pt will perform SL>sit at mod A level in order to  indicate improved functional mobility.  ?Baseline:  ?Goal status: MET ?  ?4.  Pt will perform sit>SL/supine at min A level in order to indicate improved functional mobility.   ?Baseline:  ?Goal status: MET ?  ?5.  Pt

## 2021-04-30 ENCOUNTER — Ambulatory Visit: Payer: Medicare Other | Admitting: Physical Therapy

## 2021-04-30 ENCOUNTER — Ambulatory Visit: Payer: Medicare Other | Admitting: Occupational Therapy

## 2021-04-30 DIAGNOSIS — R208 Other disturbances of skin sensation: Secondary | ICD-10-CM

## 2021-04-30 DIAGNOSIS — M6281 Muscle weakness (generalized): Secondary | ICD-10-CM

## 2021-04-30 DIAGNOSIS — R278 Other lack of coordination: Secondary | ICD-10-CM

## 2021-04-30 DIAGNOSIS — M25611 Stiffness of right shoulder, not elsewhere classified: Secondary | ICD-10-CM

## 2021-04-30 DIAGNOSIS — M25621 Stiffness of right elbow, not elsewhere classified: Secondary | ICD-10-CM

## 2021-04-30 DIAGNOSIS — R2689 Other abnormalities of gait and mobility: Secondary | ICD-10-CM

## 2021-04-30 DIAGNOSIS — R293 Abnormal posture: Secondary | ICD-10-CM

## 2021-04-30 NOTE — Therapy (Signed)
?OUTPATIENT PHYSICAL THERAPY TREATMENT NOTE ? ? ?Patient Name: Anthony Downs ?MRN: [id number] ?DOB:08-Jul-1950, 71 y.o., male ?Today's Date: 04/30/2021 ? ?PCP: Redmond School, MD ?REFERRING PROVIDER: [name], MD  ? ? PT End of Session - 04/30/21 1534   ? ? Visit Number 9   ? Number of Visits 17   ? Date for PT Re-Evaluation 05/31/21   ? Authorization Type Medicare and UHC (needs 10th visit progress note)   ? Progress Note Due on Visit 10   ? PT Start Time 1533   ? PT Stop Time 5009   ? PT Time Calculation (min) 48 min   ? Equipment Utilized During Treatment Other (comment);Gait belt   slideboard  ? Activity Tolerance Patient tolerated treatment well   ? Behavior During Therapy [facility] for tasks assessed/performed   ? ?  ?  ? ?  ? ? ? ? ? ? ?Past Medical History:  ?Diagnosis Date  ? Acid reflux   ? Anxiety   ? Arthritis   ? Asthma   ? as a child  ? Basal cell carcinoma 01/26/2021  ? RIGHT POST SURICULAR INFERIOR  ? Basal cell carcinoma 01/26/2021  ? RIGHT POST AURICULAR SUPERIOR  ? Benign brain tumor ([place])   ? Cancer [facility])   ? 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, squamous behind ear  ? Complication of anesthesia   ? slow to wake up and pain medicines make him sick  ? GERD (gastroesophageal reflux disease)   ? Hypercholesteremia   ? Hypertension   ? PONV (postoperative nausea and vomiting)   ? Sleep apnea   ? no Cpap  ? 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 fell off truck hit head on concrete  ? ?Past Surgical History:  ?Procedure Laterality Date  ? APPLICATION OF CRANIAL NAVIGATION Left 04/29/2020  ? Procedure: APPLICATION OF CRANIAL NAVIGATION;  Surgeon: Judith Part, MD;  Location: [place];  Service: Neurosurgery;  Laterality: Left;  ? arthroscopic knee    ? COLONOSCOPY N/A 11/29/2018  ? Procedure: COLONOSCOPY;  Surgeon: Rogene [place], MD;  [facility];  Service: Endoscopy;  Laterality: N/A;  730-rescheduled 9/2 same time per Lelon Frohlich  ? CRANIOTOMY Left 04/29/2020  ? Procedure: LEFT  CRANIECTOMY WITH TUMOR EXCISION;  Surgeon: Judith Part, MD;  Location: [place];  Service: Neurosurgery;  Laterality: Left;  ? KNEE SURGERY Right   ? NASAL SEPTOPLASTY W/ TURBINOPLASTY Bilateral 03/19/2020  ? Procedure: NASAL SEPTOPLASTY WITH BILATERAL  TURBINATE REDUCTION;  Surgeon: Leta Baptist, MD;  Location: [place];  Service: ENT;  Laterality: Bilateral;  ? VASECTOMY    ? ?Patient Active Problem List  ? Diagnosis Date Noted  ? Seizure ([place]) 08/13/2020  ? Seizures ([place]) 08/12/2020  ? 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 DVT (deep venous thrombosis) ([place]) 08/12/2020  ? COVID-19 virus infection 08/12/2020  ? Acute lower UTI 07/14/2020  ? Delirium 07/06/2020  ? Sleep apnea 07/06/2020  ? GERD (gastroesophageal reflux disease) 07/06/2020  ? 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[place]) 06/25/2020  ? Sundowning 06/25/2020  ? Slow transit constipation   ? Essential hypertension   ? Hypokalemia   ? Postoperative pain   ? Meningioma ([place]) 05/02/2020  ? Brain tumor ([employer]) 04/28/2020  ? S/P nasal septoplasty 03/19/2020  ? Special screening for malignant neoplasms, colon 01/09/2018  ? ? ?REFERRING DIAG: D32.9 (ICD-10-CM) - Meningioma ([place]) G82.50 (ICD-10-CM) - 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[place]) R56.9 (ICD-10-CM) - Seizures ([place])   ? ?THERAPY DIAG:  ?Muscle weakness (generalized) ? ?Other  abnormalities of gait and mobility ? ?Abnormal posture ? ?PERTINENT HISTORY: anxiety, OA, cancer, subdural hematoma  ?  ? ?PRECAUTIONS: Fall ? ?SUBJECTIVE: Pt reports he is extremely fatigued following long day of social events and OT session. No new changes  ?PAIN:  ?Are you having pain? No ? ? ?OBJECTIVE:  ?  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 There are mild chronic small-vessel ischemic ?changes of the white matter. There is atrophy, encephalomalacia and ?gliosis along the medial aspects of both sides of the brain at the ?frontoparietal vertex, sequela of the previous tumor  and subsequent ?resection. It appears that the midportion of the superior sagittal ?sinus has been resected. No evidence of tumor in the venous sinuses ?anterior or posterior to that. ?    ?  ?TODAY'S TREATMENT:  ?NMR ?Pt received seated edge of mat. Performed lateral lean w/push-up x2 per side w/max cues, as pt unwilling to bear weight through his elbow and relied heavily on core control to hold himself at a sidelying position.  ? ?Attempted sit <>supine transfer using leg lifter on RLE and hooking LLE behind RLE, but pt adamant that he "can't" and required max cues to attempt transfer. Reminded pt that he had completed transfer 2 days prior, pt responded that he "cannot remember one damn day from the next". Pt very adamant that therapist should move pt for him, provided education on pt not benefiting from therapist and caregiver moving for him. After several minutes of encouragement, pt able to perform sit <>supine onto L side w/min A for hooking of BLEs and management of reacher on RLE. Pt then able to perform supine <>sit edge of mat using reacher on RLE and min assist to better position LUE under pt to push up and assist bringing BLEs off mat.  ? ?Pt performed lateral slide board transfer from mat to power chair on R side w/mod-max A due to fatigue. Pt performed by having chair placed in front of him to provide cue to reach forward and pull himself up to mini-squat position to scoot along board. Max cues for proper hand and body position. Mod A to adjust feet throughout and guide hips to R side, as pt able to lift hips but not scoot. Once in chair, max A to adjust hip position and place feet on foot rest.  ? ? ?PATIENT EDUCATION: ?Education details: Practicing rolling L & R and supine >sidelying at home, continue HEP, importance of attempting to perform transfers on his own for improved mobility  ?Person educated: Patient and Spouse ?Education method: Explanation, Demonstration, Tactile cues, and Verbal  cues ?Education comprehension: verbalized understanding and needs further education ? ?  ?HOME EXERCISE PROGRAM: ?[email or web address] ?  ?ASSESSMENT: ?  ?CLINICAL IMPRESSION: ?Emphasis of skilled PT session on bed mobility and transfers. Pt very fatigued today and unwilling to try new activities without max encouragement. Pt frequently telling therapist and wife to "move my legs for me", heavy education regarding importance of pt attempting to move himself for improved functional mobility. Pt continues to require min-mod A for sit <>supine transfer for BLE management and cues for body mechanics and could benefit from continued practice w/leg lifter to aide with transfers. Continue POC.  ?  ?OBJECTIVE IMPAIRMENTS decreased activity tolerance, decreased balance, decreased coordination, decreased endurance, decreased knowledge of use of DME, decreased mobility, difficulty walking, decreased ROM, decreased strength, hypomobility, impaired perceived functional ability, impaired flexibility, impaired  UE functional use, and postural dysfunction.  ?  ?ACTIVITY LIMITATIONS cleaning, community activity, meal prep, laundry, and medication management.  ?  ?PERSONAL FACTORS Time since onset of injury/illness/exacerbation and 3+ comorbidities: anxiety, OA, cancer, subdural hematoma  are also affecting patient's functional outcome.  ?  ?  ?REHAB POTENTIAL: Fair due to amount of time since onset and previous progress with PT ?  ?CLINICAL DECISION MAKING: Evolving/moderate complexity ?  ?EVALUATION COMPLEXITY: Moderate ?  ?  ?GOALS: ?Goals reviewed with patient? Yes ?  ?SHORT TERM GOALS: Target date: 04/29/2021 ?  ?Pt/caregiver will be IND with HEP in order to indicate improved functional mobility and improved flexibility. ?Baseline:  ?Goal status: MET ?  ?2.  Pt will perform rolling R at min A level and L at mod/max A level in order to indicate improved strength and  independence with bed mobility.  ?Baseline:  ?Goal status: MET ?  ?3.  Pt will perform SL>sit at mod A level in order to indicate improved functional mobility.  ?Baseline:  ?Goal status: MET ?  ?4.  Pt will p

## 2021-04-30 NOTE — Therapy (Signed)
?OUTPATIENT OCCUPATIONAL THERAPY TREATMENT NOTE ? ? ?Patient Name: Anthony Downs ?MRN: [id number] ?DOB:1950-01-28, 71 y.o., male ?Today's Date: 04/30/2021 ? ?PCP: Redmond School, MD ?REFERRING PROVIDER: Redmond School, MD ? ? OT End of Session - 04/30/21 1533   ? ? Visit Number 9   ? Number of Visits 17   ? Date for OT Re-Evaluation 06/15/21   ? Authorization Type Medicare and UHC   ? Authorization Time Period UHC VL - Need medical auth after 30 visits   ? OT Start Time 8588   ? OT Stop Time 1530   ? OT Time Calculation (min) 45 min   ? Equipment Utilized During Best boy, reacher   ? Activity Tolerance Patient tolerated treatment well   ? Behavior During Therapy [facility] for tasks assessed/performed   ? ?  ?  ? ?  ? ? ? ? ?Past Medical History:  ?Diagnosis Date  ? Acid reflux   ? Anxiety   ? Arthritis   ? Asthma   ? as a child  ? Basal cell carcinoma 01/26/2021  ? RIGHT POST SURICULAR INFERIOR  ? Basal cell carcinoma 01/26/2021  ? RIGHT POST AURICULAR SUPERIOR  ? Benign brain tumor ([place])   ? Cancer [facility])   ? 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, squamous behind ear  ? Complication of anesthesia   ? slow to wake up and pain medicines make him sick  ? GERD (gastroesophageal reflux disease)   ? Hypercholesteremia   ? Hypertension   ? PONV (postoperative nausea and vomiting)   ? Sleep apnea   ? no Cpap  ? 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 fell off truck hit head on concrete  ? ?Past Surgical History:  ?Procedure Laterality Date  ? APPLICATION OF CRANIAL NAVIGATION Left 04/29/2020  ? Procedure: APPLICATION OF CRANIAL NAVIGATION;  Surgeon: Judith Part, MD;  Location: [place];  Service: Neurosurgery;  Laterality: Left;  ? arthroscopic knee    ? COLONOSCOPY N/A 11/29/2018  ? Procedure: COLONOSCOPY;  Surgeon: Rogene [place], MD;  [facility];  Service: Endoscopy;  Laterality: N/A;  730-rescheduled 9/2 same time per Lelon Frohlich  ? CRANIOTOMY Left 04/29/2020  ? Procedure: LEFT CRANIECTOMY  WITH TUMOR EXCISION;  Surgeon: Judith Part, MD;  Location: [place];  Service: Neurosurgery;  Laterality: Left;  ? KNEE SURGERY Right   ? NASAL SEPTOPLASTY W/ TURBINOPLASTY Bilateral 03/19/2020  ? Procedure: NASAL SEPTOPLASTY WITH BILATERAL  TURBINATE REDUCTION;  Surgeon: Leta Baptist, MD;  Location: [place];  Service: ENT;  Laterality: Bilateral;  ? VASECTOMY    ? ?Patient Active Problem List  ? Diagnosis Date Noted  ? Seizure ([place]) 08/13/2020  ? Seizures ([place]) 08/12/2020  ? 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 DVT (deep venous thrombosis) ([place]) 08/12/2020  ? COVID-19 virus infection 08/12/2020  ? Acute lower UTI 07/14/2020  ? Delirium 07/06/2020  ? Sleep apnea 07/06/2020  ? GERD (gastroesophageal reflux disease) 07/06/2020  ? 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[place]) 06/25/2020  ? Sundowning 06/25/2020  ? Slow transit constipation   ? Essential hypertension   ? Hypokalemia   ? Postoperative pain   ? Meningioma ([place]) 05/02/2020  ? Brain tumor ([place]) 04/28/2020  ? S/P nasal septoplasty 03/19/2020  ? Special screening for malignant neoplasms, colon 01/09/2018  ? ? ?ONSET DATE: 04/28/20 ? ?REFERRING DIAG: Meningioma, Tetraplegia ? ?THERAPY DIAG:  ?Muscle weakness (generalized) ? ?Other abnormalities of gait and mobility ? ?Stiffness of right shoulder, not elsewhere classified ? ?Other lack  of coordination ? ?Stiffness of right elbow, not elsewhere classified ? ?Other disturbances of skin sensation ? ? ?PERTINENT HISTORY: traumatic SDH 2008 ? ?PRECAUTIONS: Fall Risk ? ?SUBJECTIVE: "Oh, I'm doing alright" ? ?PAIN:  ?Are you having pain? No ? ? ? ? ?OBJECTIVE:  ? ?TODAY'S TREATMENT:  ? ?04/30/21 ?Standing at parallel bars with bar in front to simulate grab bars for increasing ability to stand and manipulate clothing and/or perform hygiene. Pt completed sit to stand and prolonged standing x approx 60 seconds each x 5 times. Pt worked on [employer], [employer] clothespins from clothing for increasing ability to decrease upper extremity support to  unilateral at times any dynamic reaching around body. Pt did well with min assistance. Sit to stand with anterior support for standing with close supervision  ? ?LB Dressing with standing at bar with use of reacher to thread BLE with mod A and working on clothing management and pulling pants up on both sides with mod A. Overall, completed with max A.  ? ?Sliding Board Transfer x 1 to left side with assistance req'd for set up and cueing for leaning with head to right and encouraging hips to left for transferring towards mat from power chair. Pt req'd min to mod A For transfer and with more appropriate time and speed this day than previous attempts.  ? ? ? ? ? ? ? ? OT Short Term Goals - 04/01/21 1645   ? ?  ? OT SHORT TERM GOAL #1  ? Title Patient and caregiver will complete HEP designed to improve passive and active range of motion in BUE   ? Time 4   ? Period Weeks   ? Status Ongoing  ? Target Date 05/16/21   ?  ? OT SHORT TERM GOAL #2  ? Title Patient will utilize RUE to assist with at least one ADL task with mod cueing and minimal assistance   ? Time 4   ? Period Weeks   ? Status Ongoing spouse reports encouraging patient to use RUE a lot of the time during ADLs.  ?  ? OT SHORT TERM GOAL #3  ? Title Patient will reach at least mid calf toward feet with BUE in preparation for participating in LB dressing   ? Time 4   ? Period Weeks   ? Status Ongoing   ?  ? OT SHORT TERM GOAL #4  ? Title Patient will demonstrate sufficient lateral lean left or right in seated position to prepare for placement of sliding board to encourage more active transfers and increase potential for commode transfers.   ? Time 4   ? Period Weeks   ? Status Ongoing  ?  ? OT SHORT TERM GOAL #5  ? Title Pt will be educated on available AE and DME to assist with ADL completion as needed for improved independence.   ? Time 4   ? Status Ongoing  ? ?  ?  ? ?  ? ? ? OT Long Term Goals - 04/01/21 1652   ? ?  ? OT LONG TERM GOAL #1  ? Title Patient  will complete updated HEP designed to improve range, strength, and functional use of BUE   ? Time 8   ? Period Weeks   ? Status New   ? Target Date 06/15/21   ?  ? OT LONG TERM GOAL #2  ? Title Patient will complete an active transfer level surface with slide board toward right with max assistance in  preparation for commode transfer   ? Time 8   ? Period Weeks   ? Status New   ?  ? OT LONG TERM GOAL #3  ? Title Patient will demonstrate improved coordination in RUE as evidenced by at least 8 blocks (box in blocks) in one minute   ? Baseline 4   ? Time 8   ? Period Weeks   ? Status New   ?  ? OT LONG TERM GOAL #4  ? Title Patient will demonstrate a 4lb increase in grip strength in RUE   ? Baseline 6.1   ? Time 8   ? Period Weeks   ? Status New   ?  ? OT LONG TERM GOAL #5  ? Title Per patient and wife, patient will complete at least 30% more of bathing and dressing tasks   ? Time 8   ? Period Weeks   ? Status New   ? ?  ?  ? ?  ? ? ? Plan - 04/14/21 1535   ? ? Clinical Impression Statement Pt with increased independence with ADLs and continuing to progress with strength and ability to stand.   ? OT Occupational Profile and History Detailed Assessment- Review of Records and additional review of physical, cognitive, psychosocial history related to current functional performance   ? Occupational performance deficits (Please refer to evaluation for details): ADL's;IADL's;Leisure   ? Body Structure / Function / Physical Skills ADL;Decreased knowledge of use of DME;Gait;Obesity;Strength;Tone;GMC;Dexterity;Balance;Body mechanics;Edema;Proprioception;UE functional use;Cardiopulmonary status limiting activity;Endurance;IADL;ROM;Continence;Coordination;Flexibility;Mobility;Sensation;FMC;Decreased knowledge of precautions;Muscle spasms   ? Rehab Potential Good   ? Clinical Decision Making Several treatment options, min-mod task modification necessary   ? Comorbidities Affecting Occupational Performance: May have comorbidities  impacting occupational performance   ? Modification or Assistance to Complete Evaluation  Min-Moderate modification of tasks or assist with assess necessary to complete eval   ? OT Frequency 2x / week   ? OT

## 2021-05-05 ENCOUNTER — Ambulatory Visit: Payer: Medicare Other

## 2021-05-05 ENCOUNTER — Ambulatory Visit: Payer: Medicare Other | Admitting: Occupational Therapy

## 2021-05-05 DIAGNOSIS — M25611 Stiffness of right shoulder, not elsewhere classified: Secondary | ICD-10-CM

## 2021-05-05 DIAGNOSIS — R293 Abnormal posture: Secondary | ICD-10-CM

## 2021-05-05 DIAGNOSIS — R208 Other disturbances of skin sensation: Secondary | ICD-10-CM

## 2021-05-05 DIAGNOSIS — M6281 Muscle weakness (generalized): Secondary | ICD-10-CM

## 2021-05-05 DIAGNOSIS — R29898 Other symptoms and signs involving the musculoskeletal system: Secondary | ICD-10-CM

## 2021-05-05 DIAGNOSIS — R278 Other lack of coordination: Secondary | ICD-10-CM

## 2021-05-05 DIAGNOSIS — R2689 Other abnormalities of gait and mobility: Secondary | ICD-10-CM

## 2021-05-05 DIAGNOSIS — M25621 Stiffness of right elbow, not elsewhere classified: Secondary | ICD-10-CM

## 2021-05-05 NOTE — Therapy (Signed)
?OUTPATIENT OCCUPATIONAL THERAPY TREATMENT AND PROGRESS NOTE ? ? ?Patient Name: Anthony Downs ?MRN: [id number] ?DOB:1950-10-15, 71 y.o., male ?Today's Date: 05/05/2021 ? ?PCP: Redmond School, MD ?REFERRING PROVIDER: Redmond School, MD ? ? OT End of Session - 05/05/21 1329   ? ? Visit Number 10   ? Number of Visits 17   ? Date for OT Re-Evaluation 06/15/21   ? Authorization Type Medicare and UHC   ? Authorization Time Period UHC VL - Need medical auth after 30 visits   ? OT Start Time 1015   ? OT Stop Time 1100   ? OT Time Calculation (min) 45 min   ? Equipment Utilized During [employer], tub transfer bench   ? Activity Tolerance Patient tolerated treatment well   ? Behavior During Therapy [facility] for tasks assessed/performed   ? ?  ?  ? ?  ? ? ? ? ?Past Medical History:  ?Diagnosis Date  ? Acid reflux   ? Anxiety   ? Arthritis   ? Asthma   ? as a child  ? Basal cell carcinoma 01/26/2021  ? RIGHT POST SURICULAR INFERIOR  ? Basal cell carcinoma 01/26/2021  ? RIGHT POST AURICULAR SUPERIOR  ? Benign brain tumor ([place])   ? Cancer [facility])   ? 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, squamous behind ear  ? Complication of anesthesia   ? slow to wake up and pain medicines make him sick  ? GERD (gastroesophageal reflux disease)   ? Hypercholesteremia   ? Hypertension   ? PONV (postoperative nausea and vomiting)   ? Sleep apnea   ? no Cpap  ? 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 fell off truck hit head on concrete  ? ?Past Surgical History:  ?Procedure Laterality Date  ? APPLICATION OF CRANIAL NAVIGATION Left 04/29/2020  ? Procedure: APPLICATION OF CRANIAL NAVIGATION;  Surgeon: Judith Part, MD;  Location: [place];  Service: Neurosurgery;  Laterality: Left;  ? arthroscopic knee    ? COLONOSCOPY N/A 11/29/2018  ? Procedure: COLONOSCOPY;  Surgeon: Rogene Houston, MD;  [facility];  Service: Endoscopy;  Laterality: N/A;  730-rescheduled 9/2 same time per Lelon Frohlich  ? CRANIOTOMY Left 04/29/2020  ?  Procedure: LEFT CRANIECTOMY WITH TUMOR EXCISION;  Surgeon: Judith Part, MD;  Location: [place];  Service: Neurosurgery;  Laterality: Left;  ? KNEE SURGERY Right   ? NASAL SEPTOPLASTY W/ TURBINOPLASTY Bilateral 03/19/2020  ? Procedure: NASAL SEPTOPLASTY WITH BILATERAL  TURBINATE REDUCTION;  Surgeon: Leta Baptist, MD;  Location: [place];  Service: ENT;  Laterality: Bilateral;  ? VASECTOMY    ? ?Patient Active Problem List  ? Diagnosis Date Noted  ? Seizure ([place]) 08/13/2020  ? Seizures ([place]) 08/12/2020  ? 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 DVT (deep venous thrombosis) ([place]) 08/12/2020  ? COVID-19 virus infection 08/12/2020  ? Acute lower UTI 07/14/2020  ? Delirium 07/06/2020  ? Sleep apnea 07/06/2020  ? GERD (gastroesophageal reflux disease) 07/06/2020  ? 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[place]) 06/25/2020  ? Sundowning 06/25/2020  ? Slow transit constipation   ? Essential hypertension   ? Hypokalemia   ? Postoperative pain   ? Meningioma ([place]) 05/02/2020  ? Brain tumor ([place]) 04/28/2020  ? S/P nasal septoplasty 03/19/2020  ? Special screening for malignant neoplasms, colon 01/09/2018  ? ? ?ONSET DATE: 04/28/20 ? ?REFERRING DIAG: Meningioma, Tetraplegia ? ?THERAPY DIAG:  ?Muscle weakness (generalized) ? ?Abnormal posture ? ?Stiffness of right shoulder, not elsewhere classified ? ?Other lack  of coordination ? ?Stiffness of right elbow, not elsewhere classified ? ?Other disturbances of skin sensation ? ?Other symptoms and signs involving the musculoskeletal system ? ? ?PERTINENT HISTORY: traumatic SDH 2008 ? ?PRECAUTIONS: Fall Risk ? ?SUBJECTIVE: Patient and wife indicate that they have stopped using third shift caregiver due to his improved mobility! ?PAIN:  ?Are you having pain? No ? ? ? ? ?OBJECTIVE:  ? ?TODAY'S TREATMENT:  ?05/05/21 ?Standing at parallel bars and working on weight shifting left/right.  Working on improving alignment in standing - increasing hip and trunk extension.  Working to reduce reliance on UE's in standing - so  worked on standing without holding on to grab bar.   ? ?Tub/Shower transfer- Simulated tub shower set up and used stedy lift to transition from wheelchair to tub/shower bench.  Practiced squat pivot to scoot along tub bench, and simulated height of tub to practice legs over edge.  Wife present and is problem solving which bathroom may be most reasonable to attempt.  Indicates they have a tub seat but unsure of what type.  Explained to patient and wife that "dry run" would be optimal as first attempt as patient has not showered in a year.   ? ? ? ?04/30/21 ?Standing at parallel bars with bar in front to simulate grab bars for increasing ability to stand and manipulate clothing and/or perform hygiene. Pt completed sit to stand and prolonged standing x approx 60 seconds each x 5 times. Pt worked on [employer] clothespins from clothing for increasing ability to decrease upper extremity support to unilateral at times any dynamic reaching around body. Pt did well with min assistance. Sit to stand with anterior support for standing with close supervision  ? ?LB Dressing with standing at bar with use of reacher to thread BLE with mod A and working on clothing management and pulling pants up on both sides with mod A. Overall, completed with max A.  ? ?Sliding Board Transfer x 1 to left side with assistance req'd for set up and cueing for leaning with head to right and encouraging hips to left for transferring towards mat from power chair. Pt req'd min to mod A For transfer and with more appropriate time and speed this day than previous attempts.  ? ? ? ? ? ? ? ? OT Short Term Goals - 04/01/21 1645   ? ?  ? OT SHORT TERM GOAL #1  ? Title Patient and caregiver will complete HEP designed to improve passive and active range of motion in BUE   ? Time 4   ? Period Weeks   ? Status Ongoing  ? Target Date 05/16/21   ?  ? OT SHORT TERM GOAL #2  ? Title Patient will utilize RUE to assist with at least one ADL task with mod  cueing and minimal assistance   ? Time 4   ? Period Weeks   ? Status Achieved  ?  ? OT SHORT TERM GOAL #3  ? Title Patient will reach at least mid calf toward feet with BUE in preparation for participating in LB dressing   ? Time 4   ? Period Weeks   ? Status Achieved  ?  ? OT SHORT TERM GOAL #4  ? Title Patient will demonstrate sufficient lateral lean left or right in seated position to prepare for placement of sliding board to encourage more active transfers and increase potential for commode transfers.   ? Time 4   ? Period Weeks   ?  Status Achieved  ?  ? OT SHORT TERM GOAL #5  ? Title Pt will be educated on available AE and DME to assist with ADL completion as needed for improved independence.   ? Time 4   ? Status Ongoing  ? ?  ?  ? ?  ? ? ? OT Long Term Goals - 04/01/21 1652   ? ?  ? OT LONG TERM GOAL #1  ? Title Patient will complete updated HEP designed to improve range, strength, and functional use of BUE   ? Time 8   ? Period Weeks   ? Status New   ? Target Date 06/15/21   ?  ? OT LONG TERM GOAL #2  ? Title Patient will complete an active transfer level surface with slide board toward right with max assistance in preparation for commode transfer   ? Time 8   ? Period Weeks   ? Status New   ?  ? OT LONG TERM GOAL #3  ? Title Patient will demonstrate improved coordination in RUE as evidenced by at least 8 blocks (box in blocks) in one minute   ? Baseline 4   ? Time 8   ? Period Weeks   ? Status New   ?  ? OT LONG TERM GOAL #4  ? Title Patient will demonstrate a 4lb increase in grip strength in RUE   ? Baseline 6.1   ? Time 8   ? Period Weeks   ? Status New   ?  ? OT LONG TERM GOAL #5  ? Title Per patient and wife, patient will complete at least 30% more of bathing and dressing tasks   ? Time 8   ? Period Weeks   ? Status New   ? ?  ?  ? ?  ? ? ? Plan - 04/14/21 1535   ? ? Clinical Impression Statement This progress note is to cover dates of service from 3/15-4/18/23.  Patient has met some of his short term  goals and is showing steady progress with his functional mobility.  Patient is showing inconsistent and often spontaneous functional use of RUE.    ? OT Occupational Profile and History Detailed Assessment- Review

## 2021-05-05 NOTE — Therapy (Signed)
?OUTPATIENT PHYSICAL THERAPY 10 VISIT PROGRESS NOTE ? ?Patient Name: Anthony Downs ?MRN: [id number] ?DOB:[birth year], 71 y.o., male ?Today's Date: 05/05/2021 ? ?PCP: Redmond School, MD ?REFERRING PROVIDER: [name], MD  ? ? PT End of Session - 05/05/21 1113   ? ? Visit Number 10   ? Number of Visits 17   ? Date for PT Re-Evaluation 05/31/21   ? Authorization Type Medicare and UHC (needs 10th visit progress note)   ? Progress Note Due on Visit 10   ? PT Start Time 1105   ? PT Stop Time 1150   ? PT Time Calculation (min) 45 min   ? Equipment Utilized During Treatment Other (comment);Gait belt   slideboard  ? Activity Tolerance Patient tolerated treatment well   ? Behavior During Therapy [facility] for tasks assessed/performed   ? ?  ?  ? ?  ? ? ? ? ? ? ?Past Medical History:  ?Diagnosis Date  ? Acid reflux   ? Anxiety   ? Arthritis   ? Asthma   ? as a child  ? Basal cell carcinoma 01/26/2021  ? RIGHT POST SURICULAR INFERIOR  ? Basal cell carcinoma 01/26/2021  ? RIGHT POST AURICULAR SUPERIOR  ? Benign brain tumor ([place])   ? Cancer [facility])   ? 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, squamous behind ear  ? Complication of anesthesia   ? slow to wake up and pain medicines make him sick  ? GERD (gastroesophageal reflux disease)   ? Hypercholesteremia   ? Hypertension   ? PONV (postoperative nausea and vomiting)   ? Sleep apnea   ? no Cpap  ? 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 fell off truck hit head on concrete  ? ?Past Surgical History:  ?Procedure Laterality Date  ? APPLICATION OF CRANIAL NAVIGATION Left 04/29/2020  ? Procedure: APPLICATION OF CRANIAL NAVIGATION;  Surgeon: Judith Part, MD;  Location: [place];  Service: Neurosurgery;  Laterality: Left;  ? arthroscopic knee    ? COLONOSCOPY N/A 11/29/2018  ? Procedure: COLONOSCOPY;  Surgeon: Rogene Houston, MD;  [facility];  Service: Endoscopy;  Laterality: N/A;  730-rescheduled 9/2 same time per Lelon Frohlich  ? CRANIOTOMY Left 04/29/2020  ?  Procedure: LEFT CRANIECTOMY WITH TUMOR EXCISION;  Surgeon: Judith Part, MD;  Location: [place];  Service: Neurosurgery;  Laterality: Left;  ? KNEE SURGERY Right   ? NASAL SEPTOPLASTY W/ TURBINOPLASTY Bilateral 03/19/2020  ? Procedure: NASAL SEPTOPLASTY WITH BILATERAL  TURBINATE REDUCTION;  Surgeon: Leta Baptist, MD;  Location: [place];  Service: ENT;  Laterality: Bilateral;  ? VASECTOMY    ? ?Patient Active Problem List  ? Diagnosis Date Noted  ? Seizure ([place]) 08/13/2020  ? Seizures ([place]) 08/12/2020  ? 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 DVT (deep venous thrombosis) ([place]) 08/12/2020  ? COVID-19 virus infection 08/12/2020  ? Acute lower UTI 07/14/2020  ? Delirium 07/06/2020  ? Sleep apnea 07/06/2020  ? GERD (gastroesophageal reflux disease) 07/06/2020  ? 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[place]) 06/25/2020  ? Sundowning 06/25/2020  ? Slow transit constipation   ? Essential hypertension   ? Hypokalemia   ? Postoperative pain   ? Meningioma ([place]) 05/02/2020  ? Brain tumor ([place]) 04/28/2020  ? S/P nasal septoplasty 03/19/2020  ? Special screening for malignant neoplasms, colon 01/09/2018  ? ? ?REFERRING DIAG: D32.9 (ICD-10-CM) - Meningioma ([place]) G82.50 (ICD-10-CM) - 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[place]) R56.9 (ICD-10-CM) - Seizures ([place])   ? ?THERAPY DIAG:  ?Muscle weakness (generalized) ? ?  Other abnormalities of gait and mobility ? ?Abnormal posture ? ?PERTINENT HISTORY: anxiety, OA, cancer, subdural hematoma  ?  ? ?PRECAUTIONS: Fall ? ?SUBJECTIVE: Pt reports he is doing okay. Wife present during the session. Pt was sitting at EOB after OT session. ?PAIN:  ?Are you having pain? No ? ? ?OBJECTIVE:  ?  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 There are mild chronic small-vessel ischemic ?changes of the white matter. There is atrophy, encephalomalacia and ?gliosis along the medial aspects of both sides of the brain at the ?frontoparietal vertex, sequela of the  previous tumor and subsequent ?resection. It appears that the midportion of the superior sagittal ?sinus has been resected. No evidence of tumor in the venous sinuses ?anterior or posterior to that. ?    ?  ?TODAY'S TREATMENT:  ?Sit to elbow prop to either side with 2 pillows. 2 x 10 R and L ?L elbow prop: pt uses L elbow extensors to come back to sitting and poor core activation noted, cues given to patient to decrease L LUE use to increase core activation in R abdominals ?R elbow prop: pt uses R abdominals pre dominately, tactile cues given at heel of hand and over upper arm to engage use of RUE with pushing back to sitting ?Sit to L sidelying: mod A with LE ?L SL to supine: SBA ?Supine to R SL: CGA to min A ?Supine to L SL: mod A ?L SL to sitting: mod to max A ?Lateral scooting to L at EOB: min A: 1 foot ?Supine to L SL: 10x, cues for lifting head up and performing ab bracing as he throws L arm and L knee over to his R side ?Supine to R SL: 10x, mod A with trunk roll, pt cued to hold his L hand with R UE and pull to L as he is rolling, needed mod A to hold L knee into flexion and approximation to lower leg thorugh knees onto the heel to improve pushing with L LE ?L sidelying: passive stretch of L hip/knee flexion to decrease some tone prior to above bed mobility and transfers ?Used steadi to transfer patient from edge of mat table to power chair. ? ? ?PATIENT EDUCATION: ?Education details: Practicing rolling L & R and supine >sidelying at home, continue HEP, importance of attempting to perform transfers on his own for improved mobility  ?Person educated: Patient and Spouse ?Education method: Explanation, Demonstration, Tactile cues, and Verbal cues ?Education comprehension: verbalized understanding and needs further education ? ?  ?HOME EXERCISE PROGRAM: ?[email or web address] ?  ?ASSESSMENT: ?  ?CLINICAL IMPRESSION: ?Patient has been seen for total  of 10 sessions from 04/01/21 to 05/05/21. Pt is demonstrating improving bed mobility and transfers with gradually decreasing assistance levels. Pt has met all of his short term goals. Pt is making progress towards his long term goals. Patient will continue to benefit from skilled PT to work towards his long term functional goals, improve independence and reduce caregiver burden. ? ?Today's skilled session focused on bed mobility and transfer training. Pt is demonstrating improving rolling to his R from supine but requires mod A with rolling to L.  ?  ?OBJECTIVE IMPAIRMENTS decreased activity tolerance, decreased balance, decreased coordination, decreased endurance, decreased knowledge of use of DME, decreased mobility, difficulty walking, decreased ROM, decreased strength, hypomobility, impaired perceived functional ability, impaired flexibility, impaired UE functional use, and postural dysfunction.  ?  ?ACTIVITY LIMITATIONS  cleaning, community activity, meal prep, laundry, and medication management.  ?  ?PERSONAL FACTORS Time since onset of injury/illness/exacerbation and 3+ comorbidities: anxiety, OA, cancer, subdural hematoma  are also affecting patient's functional outcome.  ?  ?  ?REHAB POTENTIAL: Fair due to amount of time since onset and previous progress with PT ?  ?CLINICAL DECISION MAKING: Evolving/moderate complexity ?  ?EVALUATION COMPLEXITY: Moderate ?  ?  ?GOALS: ?Goals reviewed with patient? Yes ?  ?SHORT TERM GOALS: Target date: 04/29/2021 ?  ?Pt/caregiver will be IND with HEP in order to indicate improved functional mobility and improved flexibility. ?Baseline:  ?Goal status: MET ?  ?2.  Pt will perform rolling R at min A level and L at mod/max A level in order to indicate improved strength and independence with bed mobility.  ?Baseline:  ?Goal status: MET ?  ?3.  Pt will perform SL>sit at mod A level in order to indicate improved functional mobility.  ?Baseline:  ?Goal status: MET ?  ?4.  Pt will  perform sit>SL/supine at min A level in order to indicate improved functional mobility.   ?Baseline:  ?Goal status: MET ?  ?5.  Pt will perform slideboard transers at max A level in order to indicate improved funct

## 2021-05-07 ENCOUNTER — Ambulatory Visit: Payer: Medicare Other

## 2021-05-07 ENCOUNTER — Encounter: Payer: Medicare Other | Admitting: Occupational Therapy

## 2021-05-07 ENCOUNTER — Ambulatory Visit: Payer: Medicare Other | Admitting: Occupational Therapy

## 2021-05-07 ENCOUNTER — Encounter: Payer: Self-pay | Admitting: Occupational Therapy

## 2021-05-07 DIAGNOSIS — M6281 Muscle weakness (generalized): Secondary | ICD-10-CM

## 2021-05-07 DIAGNOSIS — R278 Other lack of coordination: Secondary | ICD-10-CM

## 2021-05-07 DIAGNOSIS — M25621 Stiffness of right elbow, not elsewhere classified: Secondary | ICD-10-CM

## 2021-05-07 DIAGNOSIS — R293 Abnormal posture: Secondary | ICD-10-CM

## 2021-05-07 DIAGNOSIS — R29898 Other symptoms and signs involving the musculoskeletal system: Secondary | ICD-10-CM

## 2021-05-07 DIAGNOSIS — R208 Other disturbances of skin sensation: Secondary | ICD-10-CM

## 2021-05-07 DIAGNOSIS — R2689 Other abnormalities of gait and mobility: Secondary | ICD-10-CM

## 2021-05-07 DIAGNOSIS — M25611 Stiffness of right shoulder, not elsewhere classified: Secondary | ICD-10-CM

## 2021-05-07 NOTE — Therapy (Signed)
?OUTPATIENT OCCUPATIONAL THERAPY TREATMENT AND PROGRESS NOTE ? ? ?Patient Name: Anthony Downs ?MRN: [id number] ?DOB:[birth year], 71 y.o., male ?Today's Date: 05/07/2021 ? ?PCP: Redmond School, MD ?REFERRING PROVIDER: Redmond School, MD ? ? OT End of Session - 05/07/21 1336   ? ? Visit Number 11   ? Number of Visits 17   ? Date for OT Re-Evaluation 06/15/21   ? Authorization Type Medicare and UHC   ? Authorization Time Period UHC VL - Need medical auth after 30 visits   ? OT Start Time 1235   ? OT Stop Time 1320   ? OT Time Calculation (min) 45 min   ? Activity Tolerance Patient tolerated treatment well   ? Behavior During Therapy [facility] for tasks assessed/performed   ? ?  ?  ? ?  ? ? ? ? ?Past Medical History:  ?Diagnosis Date  ? Acid reflux   ? Anxiety   ? Arthritis   ? Asthma   ? as a child  ? Basal cell carcinoma 01/26/2021  ? RIGHT POST SURICULAR INFERIOR  ? Basal cell carcinoma 01/26/2021  ? RIGHT POST AURICULAR SUPERIOR  ? Benign brain tumor ([place])   ? Cancer [facility])   ? 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, squamous behind ear  ? Complication of anesthesia   ? slow to wake up and pain medicines make him sick  ? GERD (gastroesophageal reflux disease)   ? Hypercholesteremia   ? Hypertension   ? PONV (postoperative nausea and vomiting)   ? Sleep apnea   ? no Cpap  ? 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 fell off truck hit head on concrete  ? ?Past Surgical History:  ?Procedure Laterality Date  ? APPLICATION OF CRANIAL NAVIGATION Left 04/29/2020  ? Procedure: APPLICATION OF CRANIAL NAVIGATION;  Surgeon: Judith Part, MD;  Location: [place];  Service: Neurosurgery;  Laterality: Left;  ? arthroscopic knee    ? COLONOSCOPY N/A 11/29/2018  ? Procedure: COLONOSCOPY;  Surgeon: Rogene Houston, MD;  [facility];  Service: Endoscopy;  Laterality: N/A;  730-rescheduled 9/2 same time per Lelon Frohlich  ? CRANIOTOMY Left 04/29/2020  ? Procedure: LEFT CRANIECTOMY WITH TUMOR EXCISION;  Surgeon: Judith Part, MD;  Location: [place];  Service: Neurosurgery;  Laterality: Left;  ? KNEE SURGERY Right   ? NASAL SEPTOPLASTY W/ TURBINOPLASTY Bilateral 03/19/2020  ? Procedure: NASAL SEPTOPLASTY WITH BILATERAL  TURBINATE REDUCTION;  Surgeon: Leta Baptist, MD;  Location: [place];  Service: ENT;  Laterality: Bilateral;  ? VASECTOMY    ? ?Patient Active Problem List  ? Diagnosis Date Noted  ? Seizure ([place]) 08/13/2020  ? Seizures ([place]) 08/12/2020  ? 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 DVT (deep venous thrombosis) ([place]) 08/12/2020  ? COVID-19 virus infection 08/12/2020  ? Acute lower UTI 07/14/2020  ? Delirium 07/06/2020  ? Sleep apnea 07/06/2020  ? GERD (gastroesophageal reflux disease) 07/06/2020  ? 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[place]) 06/25/2020  ? Sundowning 06/25/2020  ? Slow transit constipation   ? Essential hypertension   ? Hypokalemia   ? Postoperative pain   ? Meningioma ([place]) 05/02/2020  ? Brain tumor ([place]) 04/28/2020  ? S/P nasal septoplasty 03/19/2020  ? Special screening for malignant neoplasms, colon 01/09/2018  ? ? ?ONSET DATE: 04/28/20 ? ?REFERRING DIAG: Meningioma, Tetraplegia ? ?THERAPY DIAG:  ?Muscle weakness (generalized) ? ?Abnormal posture ? ?Stiffness of right shoulder, not elsewhere classified ? ?Other lack of coordination ? ?Stiffness of right elbow, not elsewhere classified ? ?Other  disturbances of skin sensation ? ?Other symptoms and signs involving the musculoskeletal system ? ? ?PERTINENT HISTORY: traumatic SDH 2008 ? ?PRECAUTIONS: Fall Risk ? ?SUBJECTIVE: Patient and wife indicate that they have stopped using third shift caregiver due to his improved mobility! ?PAIN:  ?Are you having pain? No ? ? ? ? ?OBJECTIVE:  ? ?TODAY'S TREATMENT:  ?05/07/21:   ?Per patient and PT completed level surface squat pivot transfer today without slide board.   ?Standing in parallel bars with emphasis on hip, knee and trunk extension to allow patient to use hands in activity versus only for support.  Worked on standing tall - then using left hand  to build block tower.  Patient able to stand upright for 5 seconds at a time, then needed cueing/facilitation to become more active - difficulty sustaining muscle contractions.  Use of hands forced issue to utilize trunk and LE muscles for longer periods of time.   ?Functional use of RUE - seated at table working on orienting hand to various card tasks - flipping card, shuffling cards, dealing cards, cleaning/restacking and putting back in box.  Patient with motor delay, but improved use of hand in these tasks today.   ? ? ?05/05/21 ?Standing at parallel bars and working on weight shifting left/right.  Working on improving alignment in standing - increasing hip and trunk extension.  Working to reduce reliance on UE's in standing - so worked on standing without holding on to grab bar.   ? ?Tub/Shower transfer- Simulated tub shower set up and used stedy lift to transition from wheelchair to tub/shower bench.  Practiced squat pivot to scoot along tub bench, and simulated height of tub to practice legs over edge.  Wife present and is problem solving which bathroom may be most reasonable to attempt.  Indicates they have a tub seat but unsure of what type.  Explained to patient and wife that "dry run" would be optimal as first attempt as patient has not showered in a year.   ? ? ? ? ? ? ? ? ? ? ? OT Short Term Goals - 04/01/21 1645   ? ?  ? OT SHORT TERM GOAL #1  ? Title Patient and caregiver will complete HEP designed to improve passive and active range of motion in BUE   ? Time 4   ? Period Weeks   ? Status Ongoing  ? Target Date 05/16/21   ?  ? OT SHORT TERM GOAL #2  ? Title Patient will utilize RUE to assist with at least one ADL task with mod cueing and minimal assistance   ? Time 4   ? Period Weeks   ? Status Achieved  ?  ? OT SHORT TERM GOAL #3  ? Title Patient will reach at least mid calf toward feet with BUE in preparation for participating in LB dressing   ? Time 4   ? Period Weeks   ? Status Achieved  ?  ? OT  SHORT TERM GOAL #4  ? Title Patient will demonstrate sufficient lateral lean left or right in seated position to prepare for placement of sliding board to encourage more active transfers and increase potential for commode transfers.   ? Time 4   ? Period Weeks   ? Status Achieved  ?  ? OT SHORT TERM GOAL #5  ? Title Pt will be educated on available AE and DME to assist with ADL completion as needed for improved independence.   ? Time 4   ?  Status Achieved  ? ?  ?  ? ?  ? ? ? OT Long Term Goals - 04/01/21 1652   ? ?  ? OT LONG TERM GOAL #1  ? Title Patient will complete updated HEP designed to improve range, strength, and functional use of BUE   ? Time 8   ? Period Weeks   ? Status Ongoing  ? Target Date 06/15/21   ?  ? OT LONG TERM GOAL #2  ? Title Patient will complete an active transfer level surface with slide board toward right with max assistance in preparation for commode transfer   ? Time 8   ? Period Weeks   ? Status Ongoing  ?  ? OT LONG TERM GOAL #3  ? Title Patient will demonstrate improved coordination in RUE as evidenced by at least 8 blocks (box in blocks) in one minute   ? Baseline 4   ? Time 8   ? Period Weeks   ? Status Ongoing  ?  ? OT LONG TERM GOAL #4  ? Title Patient will demonstrate a 4lb increase in grip strength in RUE   ? Baseline 6.1   ? Time 8   ? Period Weeks   ? Status Ongoing  ?  ? OT LONG TERM GOAL #5  ? Title Per patient and wife, patient will complete at least 30% more of bathing and dressing tasks   ? Time 8   ? Period Weeks   ? Status Ongoing  ? ?  ?  ? ?  ? ? ? Plan - 04/14/21 1535   ? ? Clinical Impression Statement Patient showing steady progress - he and wife report more active participation in ADL and functional mobility at home.    ? OT Occupational Profile and History Detailed Assessment- Review of Records and additional review of physical, cognitive, psychosocial history related to current functional performance   ? Occupational performance deficits (Please refer to  evaluation for details): ADL's;IADL's;Leisure   ? Body Structure / Function / Physical Skills ADL;Decreased knowledge of use of DME;Gait;Obesity;Strength;Tone;GMC;Dexterity;Balance;Body mechanics;Edema;Proprioc

## 2021-05-07 NOTE — Therapy (Signed)
?OUTPATIENT PHYSICAL THERAPY  ? ?Patient Name: Anthony Downs ?MRN: [id number] ?DOB:[birth year], 71 y.o., male ?Today's Date: 05/07/2021 ? ?PCP: Redmond School, MD ?REFERRING PROVIDER: [name], MD  ? ? PT End of Session - 05/07/21 1150   ? ? Visit Number 11   ? Number of Visits 17   ? Date for PT Re-Evaluation 05/31/21   ? Authorization Type Medicare and UHC (needs 10th visit progress note)   ? Progress Note Due on Visit 10   ? PT Start Time 1148   ? PT Stop Time 1230   ? PT Time Calculation (min) 42 min   ? Equipment Utilized During Treatment Other (comment);Gait belt   slideboard  ? Activity Tolerance Patient tolerated treatment well   ? Behavior During Therapy [facility] for tasks assessed/performed   ? ?  ?  ? ?  ? ? ? ? ? ? ?Past Medical History:  ?Diagnosis Date  ? Acid reflux   ? Anxiety   ? Arthritis   ? Asthma   ? as a child  ? Basal cell carcinoma 01/26/2021  ? RIGHT POST SURICULAR INFERIOR  ? Basal cell carcinoma 01/26/2021  ? RIGHT POST AURICULAR SUPERIOR  ? Benign brain tumor ([place])   ? Cancer [facility])   ? 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, squamous behind ear  ? Complication of anesthesia   ? slow to wake up and pain medicines make him sick  ? GERD (gastroesophageal reflux disease)   ? Hypercholesteremia   ? Hypertension   ? PONV (postoperative nausea and vomiting)   ? Sleep apnea   ? no Cpap  ? 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 fell off truck hit head on concrete  ? ?Past Surgical History:  ?Procedure Laterality Date  ? APPLICATION OF CRANIAL NAVIGATION Left 04/29/2020  ? Procedure: APPLICATION OF CRANIAL NAVIGATION;  Surgeon: Judith Part, MD;  Location: [place];  Service: Neurosurgery;  Laterality: Left;  ? arthroscopic knee    ? COLONOSCOPY N/A 11/29/2018  ? Procedure: COLONOSCOPY;  Surgeon: Rogene Houston, MD;  [facility];  Service: Endoscopy;  Laterality: N/A;  730-rescheduled 9/2 same time per Lelon Frohlich  ? CRANIOTOMY Left 04/29/2020  ? Procedure: LEFT CRANIECTOMY  WITH TUMOR EXCISION;  Surgeon: Judith Part, MD;  Location: [place];  Service: Neurosurgery;  Laterality: Left;  ? KNEE SURGERY Right   ? NASAL SEPTOPLASTY W/ TURBINOPLASTY Bilateral 03/19/2020  ? Procedure: NASAL SEPTOPLASTY WITH BILATERAL  TURBINATE REDUCTION;  Surgeon: Leta Baptist, MD;  Location: [place];  Service: ENT;  Laterality: Bilateral;  ? VASECTOMY    ? ?Patient Active Problem List  ? Diagnosis Date Noted  ? Seizure ([place]) 08/13/2020  ? Seizures ([place]) 08/12/2020  ? 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 DVT (deep venous thrombosis) ([employer]) 08/12/2020  ? COVID-19 virus infection 08/12/2020  ? Acute lower UTI 07/14/2020  ? Delirium 07/06/2020  ? Sleep apnea 07/06/2020  ? GERD (gastroesophageal reflux disease) 07/06/2020  ? 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[place]) 06/25/2020  ? Sundowning 06/25/2020  ? Slow transit constipation   ? Essential hypertension   ? Hypokalemia   ? Postoperative pain   ? Meningioma ([place]) 05/02/2020  ? Brain tumor ([place]) 04/28/2020  ? S/P nasal septoplasty 03/19/2020  ? Special screening for malignant neoplasms, colon 01/09/2018  ? ? ?REFERRING DIAG: D32.9 (ICD-10-CM) - Meningioma ([place]) G82.50 (ICD-10-CM) - 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[place]) R56.9 (ICD-10-CM) - Seizures ([place])   ? ?THERAPY DIAG:  ?Muscle weakness (generalized) ? ?Abnormal posture ? ?  Other abnormalities of gait and mobility ? ?PERTINENT HISTORY: anxiety, OA, cancer, subdural hematoma  ?  ? ?PRECAUTIONS: Fall ? ?SUBJECTIVE: Pt reports he is  doing well. No changes. Wife brought in pictures of bathroom setup. She thinks she will be able to get him in the tub as can turn Stedy to get in front of tub at bench. Abs were sore after last time. ?PAIN:  ?Are you having pain? Yes: NPRS scale: sore/10 ?Pain location: left shin ?Pain description: sore ?Aggravating factors: bumped coming in to therapy on mat ?Relieving factors:   ? ? ?OBJECTIVE:  ?  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 There are mild chronic small-vessel ischemic ?changes of the white matter. There is atrophy, encephalomalacia and ?gliosis along the medial aspects of both sides of the brain at the ?frontoparietal vertex, sequela of the previous tumor and subsequent ?resection. It appears that the midportion of the superior sagittal ?sinus has been resected. No evidence of tumor in the venous sinuses ?anterior or posterior to that. ?    ?  ?TODAY'S TREATMENT:  ? 05/07/21: ?  Lateral scoot transfer to left w/c to mat with multiple scoots with cues to lean forward to help with movement CGA. Going slightly downhill. ?Scooting to left along edge of mat: Pt was cued to try to lean away from direction he is going some to help get bottom going in correct direction. Challenged with going to the left needing multiple attempts to move just a little. Again cued to lean forward as well to help with weight shift. Min assist and assist to move right leg over each scoot. ?  Scooting back on mat: cued to lean away and pull back contralateral hip one at a time CGA. ?Sitting edge of mat coming down on forearm to each side x 5 with reach across to touch cone with other hand to get more trunk rotation. Cued to be sure to breath throughout. Placed a pillow under right forearm with PT assisting to guide at shoulder. Denied any pain. Cued to push back up through arm. Also cued to keep head forward and not lean back. ?Standing in Stedy x 4 bouts: Stood 30 sec to a minute each time. Verbal and tactile cues to stand tall with chest out and bottom in. Knees flexed but able to stand with legs away from pads. During last bout worked on reaching with each arm out to side to try to get some weight shift. ? ?PATIENT EDUCATION: ? ? ?  ?HOME EXERCISE PROGRAM: ?[email or web address] ?  ?ASSESSMENT: ?  ?CLINICAL IMPRESSION: ?Pt able to perform lateral scoot transfer going to left  which is more challenging without slideboard today. Challenged with scooting along edge of mat to left. Worked on trying to get more trunk rotation in sitting and in standing with reaching. ? ?Today's skilled session focused on bed mobility and transfer training. Pt is demonstrating improving rolling to his R from supine but requires mod A with rolling to L.  ?  ?OBJECTIVE IMPAIRMENTS decreased activity tolerance, decreased balance, decreased coordination, decreased endurance, decreased knowledge of use of DME, decreased mobility, difficulty walking, decreased ROM, decreased strength, hypomobility, impaired perceived functional ability, impaired flexibility, impaired UE functional use, and postural dysfunction.  ?  ?ACTIVITY LIMITATIONS cleaning, community activity, meal prep, laundry, and medication management.  ?  ?PERSONAL FACTORS Time since onset of injury/illness/exacerbation and 3+ comorbidities: anxiety, OA, cancer, subdural  hematoma  are also affecting patient's functional outcome.  ?  ?  ?REHAB POTENTIAL: Fair due to amount of time since onset and previous progress with PT ?  ?CLINICAL DECISION MAKING: Evolving/moderate complexity ?  ?EVALUATION COMPLEXITY: Moderate ?  ?  ?GOALS: ?Goals reviewed with patient? Yes ?  ?SHORT TERM GOALS: Target date: 04/29/2021 ?  ?Pt/caregiver will be IND with HEP in order to indicate improved functional mobility and improved flexibility. ?Baseline:  ?Goal status: MET ?  ?2.  Pt will perform rolling R at min A level and L at mod/max A level in order to indicate improved strength and independence with bed mobility.  ?Baseline:  ?Goal status: MET ?  ?3.  Pt will perform SL>sit at mod A level in order to indicate improved functional mobility.  ?Baseline:  ?Goal status: MET ?  ?4.  Pt will perform sit>SL/supine at min A level in order to indicate improved functional mobility.   ?Baseline:  ?Goal status: MET ?  ?5.  Pt will perform slideboard transers at max A level in order to  indicate improved functional mobility.   ?Baseline:  ?Goal status: MET ?  ?6.  Pt will tolerate standing with use of stedy x 5 mins in order to indicate improved standing tolerance and improved BLE flexibility.   ?

## 2021-05-12 ENCOUNTER — Encounter: Payer: Self-pay | Admitting: Rehabilitation

## 2021-05-12 ENCOUNTER — Ambulatory Visit: Payer: Medicare Other | Admitting: Rehabilitation

## 2021-05-12 ENCOUNTER — Ambulatory Visit: Payer: Medicare Other | Admitting: Occupational Therapy

## 2021-05-12 DIAGNOSIS — M25611 Stiffness of right shoulder, not elsewhere classified: Secondary | ICD-10-CM

## 2021-05-12 DIAGNOSIS — R2689 Other abnormalities of gait and mobility: Secondary | ICD-10-CM

## 2021-05-12 DIAGNOSIS — R293 Abnormal posture: Secondary | ICD-10-CM

## 2021-05-12 DIAGNOSIS — M6281 Muscle weakness (generalized): Secondary | ICD-10-CM | POA: Diagnosis not present

## 2021-05-12 DIAGNOSIS — R208 Other disturbances of skin sensation: Secondary | ICD-10-CM

## 2021-05-12 DIAGNOSIS — R278 Other lack of coordination: Secondary | ICD-10-CM

## 2021-05-12 DIAGNOSIS — M25621 Stiffness of right elbow, not elsewhere classified: Secondary | ICD-10-CM

## 2021-05-12 NOTE — Therapy (Signed)
?OUTPATIENT OCCUPATIONAL THERAPY TREATMENT AND PROGRESS NOTE ? ? ?Patient Name: Anthony Downs ?MRN: [id number] ?DOB:Sep 07, 1950, 71 y.o., male ?Today's Date: 05/12/2021 ? ?PCP: Redmond School, MD ?REFERRING PROVIDER: Redmond School, MD ? ? OT End of Session - 05/12/21 1354   ? ? Visit Number 12   ? Number of Visits 17   ? Date for OT Re-Evaluation 06/15/21   ? Authorization Type Medicare and UHC   ? Authorization Time Period UHC VL - Need medical auth after 30 visits   ? OT Start Time 1230   ? OT Stop Time 1315   ? OT Time Calculation (min) 45 min   ? Activity Tolerance Patient tolerated treatment well   ? Behavior During Therapy [facility] for tasks assessed/performed   ? ?  ?  ? ?  ? ? ? ? ?Past Medical History:  ?Diagnosis Date  ? Acid reflux   ? Anxiety   ? Arthritis   ? Asthma   ? as a child  ? Basal cell carcinoma 01/26/2021  ? RIGHT POST SURICULAR INFERIOR  ? Basal cell carcinoma 01/26/2021  ? RIGHT POST AURICULAR SUPERIOR  ? Benign brain tumor ([place])   ? Cancer [facility])   ? 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, squamous behind ear  ? Complication of anesthesia   ? slow to wake up and pain medicines make him sick  ? GERD (gastroesophageal reflux disease)   ? Hypercholesteremia   ? Hypertension   ? PONV (postoperative nausea and vomiting)   ? Sleep apnea   ? no Cpap  ? 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 fell off truck hit head on concrete  ? ?Past Surgical History:  ?Procedure Laterality Date  ? APPLICATION OF CRANIAL NAVIGATION Left 04/29/2020  ? Procedure: APPLICATION OF CRANIAL NAVIGATION;  Surgeon: Judith Part, MD;  Location: [place];  Service: Neurosurgery;  Laterality: Left;  ? arthroscopic knee    ? COLONOSCOPY N/A 11/29/2018  ? Procedure: COLONOSCOPY;  Surgeon: Rogene Houston, MD;  [facility];  Service: Endoscopy;  Laterality: N/A;  730-rescheduled 9/2 same time per Lelon Frohlich  ? CRANIOTOMY Left 04/29/2020  ? Procedure: LEFT CRANIECTOMY WITH TUMOR EXCISION;  Surgeon: Judith Part, MD;  Location: [place];  Service: Neurosurgery;  Laterality: Left;  ? KNEE SURGERY Right   ? NASAL SEPTOPLASTY W/ TURBINOPLASTY Bilateral 03/19/2020  ? Procedure: NASAL SEPTOPLASTY WITH BILATERAL  TURBINATE REDUCTION;  Surgeon: Leta Baptist, MD;  Location: [place];  Service: ENT;  Laterality: Bilateral;  ? VASECTOMY    ? ?Patient Active Problem List  ? Diagnosis Date Noted  ? Seizure ([place]) 08/13/2020  ? Seizures ([place]) 08/12/2020  ? 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 DVT (deep venous thrombosis) ([place]) 08/12/2020  ? COVID-19 virus infection 08/12/2020  ? Acute lower UTI 07/14/2020  ? Delirium 07/06/2020  ? Sleep apnea 07/06/2020  ? GERD (gastroesophageal reflux disease) 07/06/2020  ? 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[place]) 06/25/2020  ? Sundowning 06/25/2020  ? Slow transit constipation   ? Essential hypertension   ? Hypokalemia   ? Postoperative pain   ? Meningioma ([place]) 05/02/2020  ? Brain tumor ([place]) 04/28/2020  ? S/P nasal septoplasty 03/19/2020  ? Special screening for malignant neoplasms, colon 01/09/2018  ? ? ?ONSET DATE: 04/28/20 ? ?REFERRING DIAG: Meningioma, Tetraplegia ? ?THERAPY DIAG:  ?Muscle weakness (generalized) ? ?Abnormal posture ? ?Stiffness of right shoulder, not elsewhere classified ? ?Other lack of coordination ? ?Stiffness of right elbow, not elsewhere classified ? ?Other  disturbances of skin sensation ? ? ?PERTINENT HISTORY: traumatic SDH 2008 ? ?PRECAUTIONS: Fall Risk ? ?SUBJECTIVE: Patient and wife indicate that they have stopped using third shift caregiver due to his improved mobility! ?PAIN:  ?Are you having pain? No ? ? ? ? ?OBJECTIVE:  ? ?TODAY'S TREATMENT:  ?05/12/21:  ?Standing in parallel bars with emphasis on hip, knee and trunk extension to allow patient to use hands in activity versus only for support.  Worked on standing tall - then using right hand to build block tower of 2.  Patient able to stand upright for 10  seconds at a time, then needed cueing/facilitation to become more active - difficulty  sustaining muscle contractions.  Use of hands forced issue to utilize trunk and LE muscles for longer periods of time.   ?Patient able to stand withut assistance x 1 using parallel bars.   ?Functional use of RUE;  Working on hand to mouth pattern with [employer].  Stacking blocks, cone - with emphasis on object well into hand versus at fingertips.   ? ? ?05/07/21:   ?Per patient and PT completed level surface squat pivot transfer today without slide board.   ?Standing in parallel bars with emphasis on hip, knee and trunk extension to allow patient to use hands in activity versus only for support.  Worked on standing tall - then using left hand to build block tower.  Patient able to stand upright for 5 seconds at a time, then needed cueing/facilitation to become more active - difficulty sustaining muscle contractions.  Use of hands forced issue to utilize trunk and LE muscles for longer periods of time.   ?Functional use of RUE - seated at table working on orienting hand to various card tasks - flipping card, shuffling cards, dealing cards, cleaning/restacking and putting back in box.  Patient with motor delay, but improved use of hand in these tasks today.   ? ? ? ? ? ? ? ? ? ? ? ? ? ? OT Short Term Goals - 04/01/21 1645   ? ?  ? OT SHORT TERM GOAL #1  ? Title Patient and caregiver will complete HEP designed to improve passive and active range of motion in BUE   ? Time 4   ? Period Weeks   ? Status Ongoing  ? Target Date 05/16/21   ?  ? OT SHORT TERM GOAL #2  ? Title Patient will utilize RUE to assist with at least one ADL task with mod cueing and minimal assistance   ? Time 4   ? Period Weeks   ? Status Achieved  ?  ? OT SHORT TERM GOAL #3  ? Title Patient will reach at least mid calf toward feet with BUE in preparation for participating in LB dressing   ? Time 4   ? Period Weeks   ? Status Achieved  ?  ? OT SHORT TERM GOAL #4  ? Title Patient will demonstrate sufficient lateral lean left or right in seated  position to prepare for placement of sliding board to encourage more active transfers and increase potential for commode transfers.   ? Time 4   ? Period Weeks   ? Status Achieved  ?  ? OT SHORT TERM GOAL #5  ? Title Pt will be educated on available AE and DME to assist with ADL completion as needed for improved independence.   ? Time 4   ? Status Achieved  ? ?  ?  ? ?  ? ? ?  OT Long Term Goals - 04/01/21 1652   ? ?  ? OT LONG TERM GOAL #1  ? Title Patient will complete updated HEP designed to improve range, strength, and functional use of BUE   ? Time 8   ? Period Weeks   ? Status Ongoing  ? Target Date 06/15/21   ?  ? OT LONG TERM GOAL #2  ? Title Patient will complete an active transfer level surface with slide board toward right with max assistance in preparation for commode transfer   ? Time 8   ? Period Weeks   ? Status Ongoing  ?  ? OT LONG TERM GOAL #3  ? Title Patient will demonstrate improved coordination in RUE as evidenced by at least 8 blocks (box in blocks) in one minute   ? Baseline 4   ? Time 8   ? Period Weeks   ? Status Ongoing  ?  ? OT LONG TERM GOAL #4  ? Title Patient will demonstrate a 4lb increase in grip strength in RUE   ? Baseline 6.1   ? Time 8   ? Period Weeks   ? Status Ongoing  ?  ? OT LONG TERM GOAL #5  ? Title Per patient and wife, patient will complete at least 30% more of bathing and dressing tasks   ? Time 8   ? Period Weeks   ? Status Ongoing  ? ?  ?  ? ?  ? ? ? Plan - 04/14/21 1535   ? ? Clinical Impression Statement Patient continues to show improved strength and postural control.  Patient and wife report improved spontaneous use of RUE, and decreased need for assistance in general at home.     ? OT Occupational Profile and History Detailed Assessment- Review of Records and additional review of physical, cognitive, psychosocial history related to current functional performance   ? Occupational performance deficits (Please refer to evaluation for details): ADL's;IADL's;Leisure    ? Body Structure / Function / Physical Skills ADL;Decreased knowledge of use of DME;Gait;Obesity;Strength;Tone;GMC;Dexterity;Balance;Body mechanics;Edema;Proprioception;UE functional use;Cardiopulmonary status limi

## 2021-05-12 NOTE — Therapy (Signed)
?OUTPATIENT PHYSICAL THERAPY  ? ?Patient Name: Anthony Downs ?MRN: [id number] ?DOB:March 18, 1950, 71 y.o., male ?Today's Date: 05/12/2021 ? ?PCP: Redmond School, MD ?REFERRING PROVIDER: [name], MD  ? ? PT End of Session - 05/12/21 1950   ? ? Visit Number 12   ? Number of Visits 17   ? Date for PT Re-Evaluation 05/31/21   ? Authorization Type Medicare and UHC (needs 10th visit progress note)   ? Progress Note Due on Visit 10   ? PT Start Time 1318   ? PT Stop Time 1400   ? PT Time Calculation (min) 42 min   ? Equipment Utilized During Treatment Other (comment);Gait belt   slideboard  ? Activity Tolerance Patient tolerated treatment well   ? Behavior During Therapy [facility] for tasks assessed/performed   ? ?  ?  ? ?  ? ? ? ? ? ? ?Past Medical History:  ?Diagnosis Date  ? Acid reflux   ? Anxiety   ? Arthritis   ? Asthma   ? as a child  ? Basal cell carcinoma 01/26/2021  ? RIGHT POST SURICULAR INFERIOR  ? Basal cell carcinoma 01/26/2021  ? RIGHT POST AURICULAR SUPERIOR  ? Benign brain tumor ([place])   ? Cancer [facility])   ? 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, squamous behind ear  ? Complication of anesthesia   ? slow to wake up and pain medicines make him sick  ? GERD (gastroesophageal reflux disease)   ? Hypercholesteremia   ? Hypertension   ? PONV (postoperative nausea and vomiting)   ? Sleep apnea   ? no Cpap  ? 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 fell off truck hit head on concrete  ? ?Past Surgical History:  ?Procedure Laterality Date  ? APPLICATION OF CRANIAL NAVIGATION Left 04/29/2020  ? Procedure: APPLICATION OF CRANIAL NAVIGATION;  Surgeon: Judith Part, MD;  Location: [place];  Service: Neurosurgery;  Laterality: Left;  ? arthroscopic knee    ? COLONOSCOPY N/A 11/29/2018  ? Procedure: COLONOSCOPY;  Surgeon: Rogene Houston, MD;  [facility];  Service: Endoscopy;  Laterality: N/A;  730-rescheduled 9/2 same time per Lelon Frohlich  ? CRANIOTOMY Left 04/29/2020  ? Procedure: LEFT CRANIECTOMY  WITH TUMOR EXCISION;  Surgeon: Judith Part, MD;  Location: [place];  Service: Neurosurgery;  Laterality: Left;  ? KNEE SURGERY Right   ? NASAL SEPTOPLASTY W/ TURBINOPLASTY Bilateral 03/19/2020  ? Procedure: NASAL SEPTOPLASTY WITH BILATERAL  TURBINATE REDUCTION;  Surgeon: Leta Baptist, MD;  Location: [place];  Service: ENT;  Laterality: Bilateral;  ? VASECTOMY    ? ?Patient Active Problem List  ? Diagnosis Date Noted  ? Seizure ([place]) 08/13/2020  ? Seizures ([place]) 08/12/2020  ? 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 DVT (deep venous thrombosis) ([place]) 08/12/2020  ? COVID-19 virus infection 08/12/2020  ? Acute lower UTI 07/14/2020  ? Delirium 07/06/2020  ? Sleep apnea 07/06/2020  ? GERD (gastroesophageal reflux disease) 07/06/2020  ? 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[place]) 06/25/2020  ? Sundowning 06/25/2020  ? Slow transit constipation   ? Essential hypertension   ? Hypokalemia   ? Postoperative pain   ? Meningioma ([place]) 05/02/2020  ? Brain tumor ([place]) 04/28/2020  ? S/P nasal septoplasty 03/19/2020  ? Special screening for malignant neoplasms, colon 01/09/2018  ? ? ?REFERRING DIAG: D32.9 (ICD-10-CM) - Meningioma ([place]) G82.50 (ICD-10-CM) - 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[place]) R56.9 (ICD-10-CM) - Seizures ([place])   ? ?THERAPY DIAG:  ?Muscle weakness (generalized) ? ?Abnormal posture ? ?  Other abnormalities of gait and mobility ? ?PERTINENT HISTORY: anxiety, OA, cancer, subdural hematoma  ?  ? ?PRECAUTIONS: Fall ? ?SUBJECTIVE: Pt reports he is  doing well. No changes. Wife brought in pictures of bathroom setup. She thinks she will be able to get him in the tub as can turn Stedy to get in front of tub at bench. Abs were sore after last time. ?PAIN:  ?Are you having pain? No ? ? ?OBJECTIVE:  ?  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 There are mild chronic small-vessel ischemic ?changes of the white matter. There is atrophy, encephalomalacia and ?gliosis along the  medial aspects of both sides of the brain at the ?frontoparietal vertex, sequela of the previous tumor and subsequent ?resection. It appears that the midportion of the superior sagittal ?sinus has been resected. No evidence of tumor in the venous sinuses ?anterior or posterior to that. ?    ?  ?TODAY'S TREATMENT:  ? 05/12/21: ? Lateral scoot transfer to left w/c to mat with multiple scoots with cues to lean forward to help with movement CGA. Transfer was mostly level today.   ?Scooting to left along edge of mat: Pt continued to be cued to try to lean away from direction he is going some to help get bottom going in correct direction. Challenged with going to the left needing multiple attempts to move just a little. Again cued and facilitation provided to lean forward as well to help with weight shift. Min to mod assist and assist to move right leg over each scoot. Once on mat, performed 3 scoots to the L followed by 3 scoots to the right with same facilitation and cues as above.  Again, pt tends to have easier time scooting to the R.   ?Utilized Stedy to have pt stand so PT could place small rocker board on mat so that he would be seated on board.  Once seated, worked on lateral weight shifts, with emphasis to lead with hips rather than shoulders.  Facilitated and demonstrated lengthening on side shifting towards and shortening on opposite side.  PT assisted with reaching tasks to the L to encourage more weight shift and trunk motion.  This improved over time with cuing.  PT assisted RUE in supported position while shifting R as well.   ?  Standing in Stedy x 5 from elevated position with cues for forward weight shift and pushing through LEs rather than "pulling" with UEs.   ? ?PATIENT EDUCATION: ? ? ?  ?HOME EXERCISE PROGRAM: ?[email or web address] ?  ?ASSESSMENT: ?  ?CLINICAL IMPRESSION: ?Skilled session continues to focus on lateral scooting both on mat  and for functional transfers and also worked on trunk dissociation with seated tasks on small rocker board.  PT initially providing max facilitation at trunk for shortening/lengthening, but pt demonstrating improvement with continued repetition.  ? ?Today's skilled session focused on bed mobility and transfer training. Pt is demonstrating improving rolling to his R from supine but requires mod A with rolling to L.  ?  ?OBJECTIVE IMPAIRMENTS decreased activity tolerance, decreased balance, decreased coordination, decreased endurance, decreased knowledge of use of DME, decreased mobility, difficulty walking, decreased ROM, decreased strength, hypomobility, impaired perceived functional ability, impaired flexibility, impaired UE functional use, and postural dysfunction.  ?  ?ACTIVITY LIMITATIONS cleaning, community activity, meal prep, laundry, and medication management.  ?  ?PERSONAL FACTORS Time since onset of injury/illness/exacerbation and 3+ comorbidities:  anxiety, OA, cancer, subdural hematoma  are also affecting patient's functional outcome.  ?  ?  ?REHAB POTENTIAL: Fair due to amount of time since onset and previous progress with PT ?  ?CLINICAL DECISION MAKING: Evolving/moderate complexity ?  ?EVALUATION COMPLEXITY: Moderate ?  ?  ?GOALS: ?Goals reviewed with patient? Yes ?  ?SHORT TERM GOALS: Target date: 04/29/2021 ?  ?Pt/caregiver will be IND with HEP in order to indicate improved functional mobility and improved flexibility. ?Baseline:  ?Goal status: MET ?  ?2.  Pt will perform rolling R at min A level and L at mod/max A level in order to indicate improved strength and independence with bed mobility.  ?Baseline:  ?Goal status: MET ?  ?3.  Pt will perform SL>sit at mod A level in order to indicate improved functional mobility.  ?Baseline:  ?Goal status: MET ?  ?4.  Pt will perform sit>SL/supine at min A level in order to indicate improved functional mobility.   ?Baseline:  ?Goal status: MET ?  ?5.  Pt will  perform slideboard transers at max A level in order to indicate improved functional mobility.   ?Baseline:  ?Goal status: MET ?  ?6.  Pt will tolerate standing with use of stedy x 5 mins in order to indi

## 2021-05-14 ENCOUNTER — Ambulatory Visit: Payer: Medicare Other | Admitting: Occupational Therapy

## 2021-05-14 ENCOUNTER — Ambulatory Visit: Payer: Medicare Other | Admitting: Physical Therapy

## 2021-05-14 ENCOUNTER — Encounter: Payer: Self-pay | Admitting: Occupational Therapy

## 2021-05-14 DIAGNOSIS — R293 Abnormal posture: Secondary | ICD-10-CM

## 2021-05-14 DIAGNOSIS — R2681 Unsteadiness on feet: Secondary | ICD-10-CM

## 2021-05-14 DIAGNOSIS — R208 Other disturbances of skin sensation: Secondary | ICD-10-CM

## 2021-05-14 DIAGNOSIS — M25611 Stiffness of right shoulder, not elsewhere classified: Secondary | ICD-10-CM

## 2021-05-14 DIAGNOSIS — R2689 Other abnormalities of gait and mobility: Secondary | ICD-10-CM

## 2021-05-14 DIAGNOSIS — M6281 Muscle weakness (generalized): Secondary | ICD-10-CM

## 2021-05-14 DIAGNOSIS — R278 Other lack of coordination: Secondary | ICD-10-CM

## 2021-05-14 DIAGNOSIS — M25621 Stiffness of right elbow, not elsewhere classified: Secondary | ICD-10-CM

## 2021-05-14 NOTE — Therapy (Signed)
?OUTPATIENT PHYSICAL THERAPY  ? ?Patient Name: Anthony Downs ?MRN: [id number] ?DOB:08/09/1950, 71 y.o., male ?Today's Date: 05/14/2021 ? ?PCP: Redmond School, MD ?REFERRING PROVIDER: [name], MD  ? ? PT End of Session - 05/14/21 1232   ? ? Visit Number 13   ? Number of Visits 17   ? Date for PT Re-Evaluation 05/31/21   ? Authorization Type Medicare and UHC (needs 10th visit progress note)   ? Progress Note Due on Visit 10   ? PT Start Time 1232   ? PT Stop Time 1314   ? PT Time Calculation (min) 42 min   ? Equipment Utilized During Treatment Gait belt   ? Activity Tolerance Patient tolerated treatment well   ? Behavior During Therapy [facility] for tasks assessed/performed   ? ?  ?  ? ?  ? ? ? ? ? ? ? ?Past Medical History:  ?Diagnosis Date  ? Acid reflux   ? Anxiety   ? Arthritis   ? Asthma   ? as a child  ? Basal cell carcinoma 01/26/2021  ? RIGHT POST SURICULAR INFERIOR  ? Basal cell carcinoma 01/26/2021  ? RIGHT POST AURICULAR SUPERIOR  ? Benign brain tumor ([place])   ? Cancer [facility])   ? 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, squamous behind ear  ? Complication of anesthesia   ? slow to wake up and pain medicines make him sick  ? GERD (gastroesophageal reflux disease)   ? Hypercholesteremia   ? Hypertension   ? PONV (postoperative nausea and vomiting)   ? Sleep apnea   ? no Cpap  ? 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 fell off truck hit head on concrete  ? ?Past Surgical History:  ?Procedure Laterality Date  ? APPLICATION OF CRANIAL NAVIGATION Left 04/29/2020  ? Procedure: APPLICATION OF CRANIAL NAVIGATION;  Surgeon: Judith Part, MD;  Location: [place];  Service: Neurosurgery;  Laterality: Left;  ? arthroscopic knee    ? COLONOSCOPY N/A 11/29/2018  ? Procedure: COLONOSCOPY;  Surgeon: Rogene Houston, MD;  [facility];  Service: Endoscopy;  Laterality: N/A;  730-rescheduled 9/2 same time per Lelon Frohlich  ? CRANIOTOMY Left 04/29/2020  ? Procedure: LEFT CRANIECTOMY WITH TUMOR EXCISION;   Surgeon: Judith Part, MD;  Location: [place];  Service: Neurosurgery;  Laterality: Left;  ? KNEE SURGERY Right   ? NASAL SEPTOPLASTY W/ TURBINOPLASTY Bilateral 03/19/2020  ? Procedure: NASAL SEPTOPLASTY WITH BILATERAL  TURBINATE REDUCTION;  Surgeon: Leta Baptist, MD;  Location: [place];  Service: ENT;  Laterality: Bilateral;  ? VASECTOMY    ? ?Patient Active Problem List  ? Diagnosis Date Noted  ? Seizure ([place]) 08/13/2020  ? Seizures ([place]) 08/12/2020  ? 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 DVT (deep venous thrombosis) ([place]) 08/12/2020  ? COVID-19 virus infection 08/12/2020  ? Acute lower UTI 07/14/2020  ? Delirium 07/06/2020  ? Sleep apnea 07/06/2020  ? GERD (gastroesophageal reflux disease) 07/06/2020  ? 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[place]) 06/25/2020  ? Sundowning 06/25/2020  ? Slow transit constipation   ? Essential hypertension   ? Hypokalemia   ? Postoperative pain   ? Meningioma ([place]) 05/02/2020  ? Brain tumor ([place]) 04/28/2020  ? S/P nasal septoplasty 03/19/2020  ? Special screening for malignant neoplasms, colon 01/09/2018  ? ? ?REFERRING DIAG: D32.9 (ICD-10-CM) - Meningioma ([place]) G82.50 (ICD-10-CM) - 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[place]) R56.9 (ICD-10-CM) - Seizures ([place])   ? ?THERAPY DIAG:  ?Muscle weakness (generalized) ? ?Unsteadiness on feet ? ?Other  abnormalities of gait and mobility ? ?PERTINENT HISTORY: anxiety, OA, cancer, subdural hematoma  ?  ? ?PRECAUTIONS: Fall ? ?SUBJECTIVE: Pt reports he is doing okay. Wife unable to use Stedy to get to tub in bathroom.  ? ?PAIN:  ?Are you having pain? No ? ? ?OBJECTIVE:  ?  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 There are mild chronic small-vessel ischemic changes of the white matter. There is atrophy, encephalomalacia and gliosis along the medial aspects of both sides of the brain at the frontoparietal vertex, sequela of the previous tumor and subsequent resection. It appears that the  midportion of the superior sagittal ?sinus has been resected. No evidence of tumor in the venous sinuses anterior or posterior to that. ?    ?  ?TODAY'S TREATMENT:  ?  NMR ?Pt attempted to propel manual WC using BLEs only. Max verbal cues to lift RLE and place on ground to push through BLEs. Pt able to push in retro direction 3 times, lifting his RLE on his own each time.  ? ?In // bars for BLE strength, improved transfers and standing tolerance:  ?-Sit <>stand x3 from manual WC w/mod A due to lower seat height, BUE support on rails. Min multimodal cues in standing for glute extension, upright posture and bilat knee block. Provided max tactile cues to pelvis to facilitate lateral weight shift throughout. Pt able to hold each stand for 2-4 minutes  ? ?WC to power chair transfer vis Stedy and +2 assist due to fatigue to stand from manual WC to [place]. Pt able to scoot posteriorly into power wheelchair independently and raise RLE to place on footrest.  ? ? ?PATIENT EDUCATION: ?Education details: Doing more ADLs for himself at home for improved independence, continue HEP  ?Person educated: Patient and Spouse ?Education method: Explanation, Demonstration, Tactile cues, and Verbal cues ?Education comprehension: verbalized understanding and needs further education ? ? ?  ?HOME EXERCISE PROGRAM: ?[email or web address] ?  ?ASSESSMENT: ?  ?CLINICAL IMPRESSION: ?Emphasis of skilled PT session on sit <>stand transfers from lower seat height. Pt attempted to self-propel in manual WC using BLEs only, had difficulty managing RLE but was able to propel in retro direction 3 times. Pt able to perform sit <>stand from lower WC height in // bars via pulling up on rails and mod A for glute extension. Discussed progression towards pushing up to stand to prioritize using BLEs rather than pulling up with BUEs only. Pt verbalized understanding. Continue POC.  ? ?OBJECTIVE  IMPAIRMENTS decreased activity tolerance, decreased balance, decreased coordination, decreased endurance, decreased knowledge of use of DME, decreased mobility, difficulty walking, decreased ROM, decreased strength, hypomobility, impaired perceived functional ability, impaired flexibility, impaired UE functional use, and postural dysfunction.  ?  ?ACTIVITY LIMITATIONS cleaning, community activity, meal prep, laundry, and medication management.  ?  ?PERSONAL FACTORS Time since onset of injury/illness/exacerbation and 3+ comorbidities: anxiety, OA, cancer, subdural hematoma  are also affecting patient's functional outcome.  ?  ?  ?REHAB POTENTIAL: Fair due to amount of time since onset and previous progress with PT ?  ?CLINICAL DECISION MAKING: Evolving/moderate complexity ?  ?EVALUATION COMPLEXITY: Moderate ?  ?  ?GOALS: ?Goals reviewed with patient? Yes ?  ?SHORT TERM GOALS: Target date: 04/29/2021 ?  ?Pt/caregiver will be IND with HEP in order to indicate improved functional mobility and improved flexibility. ?Baseline:  ?Goal status: MET ?  ?2.  Pt will perform rolling R  at min A level and L at mod/max A level in order to indicate improved strength and independence with bed mobility.  ?Baseline:  ?Goal status: MET ?  ?3.  Pt will perform SL>sit at mod A level in order to indicate improved functional mobility.  ?Baseline:  ?Goal status: MET ?  ?4.  Pt will perform sit>SL/supine at min A level in order to indicate improved functional mobility.   ?Baseline:  ?Goal status: MET ?  ?5.  Pt will perform slideboard transers at max A level in order to indicate improved functional mobility.   ?Baseline:  ?Goal status: MET ?  ?6.  Pt will tolerate standing with use of stedy x 5 mins in order to indicate improved standing tolerance and improved BLE flexibility.   ?Baseline:  ?Goal status: MET ?  ?7.  Pt will perform dynamic sitting balance reaching outside of BOS x 5 mins in order to indicate improved sitting  balance/tolerance.  ?Baseline:  ?Goal status: MET ?  ?LONG TERM GOALS: Target date: 05/27/2021 ?  ?Pt/caregiver will be IND with final HEP in order to indicate improved functional mobility and dec fall risk. ?Baseline:  ?Goal status: Progr

## 2021-05-14 NOTE — Therapy (Signed)
?OUTPATIENT OCCUPATIONAL THERAPY TREATMENT  ? ? ?Patient Name: Anthony Downs ?MRN: [id number] ?DOB:04-18-1950, 71 y.o., male ?Today's Date: 05/14/2021 ? ?PCP: Redmond School, MD ?REFERRING PROVIDER: Redmond School, MD ? ? OT End of Session - 05/14/21 1349   ? ? Visit Number 13   ? Number of Visits 17   ? Date for OT Re-Evaluation 06/15/21   ? Authorization Type Medicare and UHC   ? Authorization Time Period UHC VL - Need medical auth after 30 visits   ? OT Start Time 1145   ? OT Stop Time 1230   ? OT Time Calculation (min) 45 min   ? Equipment Utilized During [employer], tub transfer bench, standard wheelchair   ? Activity Tolerance Patient tolerated treatment well   ? Behavior During Therapy [facility] for tasks assessed/performed   ? ?  ?  ? ?  ? ? ? ? ?Past Medical History:  ?Diagnosis Date  ? Acid reflux   ? Anxiety   ? Arthritis   ? Asthma   ? as a child  ? Basal cell carcinoma 01/26/2021  ? RIGHT POST SURICULAR INFERIOR  ? Basal cell carcinoma 01/26/2021  ? RIGHT POST AURICULAR SUPERIOR  ? Benign brain tumor ([place])   ? Cancer [facility]   ? 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, squamous behind ear  ? Complication of anesthesia   ? slow to wake up and pain medicines make him sick  ? GERD (gastroesophageal reflux disease)   ? Hypercholesteremia   ? Hypertension   ? PONV (postoperative nausea and vomiting)   ? Sleep apnea   ? no Cpap  ? 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 fell off truck hit head on concrete  ? ?Past Surgical History:  ?Procedure Laterality Date  ? APPLICATION OF CRANIAL NAVIGATION Left 04/29/2020  ? Procedure: APPLICATION OF CRANIAL NAVIGATION;  Surgeon: Judith Part, MD;  Location: [place];  Service: Neurosurgery;  Laterality: Left;  ? arthroscopic knee    ? COLONOSCOPY N/A 11/29/2018  ? Procedure: COLONOSCOPY;  Surgeon: Rogene Houston, MD;  [facility];  Service: Endoscopy;  Laterality: N/A;  730-rescheduled 9/2 same time per Lelon Frohlich  ? CRANIOTOMY Left 04/29/2020  ?  Procedure: LEFT CRANIECTOMY WITH TUMOR EXCISION;  Surgeon: Judith Part, MD;  Location: [place];  Service: Neurosurgery;  Laterality: Left;  ? KNEE SURGERY Right   ? NASAL SEPTOPLASTY W/ TURBINOPLASTY Bilateral 03/19/2020  ? Procedure: NASAL SEPTOPLASTY WITH BILATERAL  TURBINATE REDUCTION;  Surgeon: Leta Baptist, MD;  Location: [place];  Service: ENT;  Laterality: Bilateral;  ? VASECTOMY    ? ?Patient Active Problem List  ? Diagnosis Date Noted  ? Seizure ([place]) 08/13/2020  ? Seizures ([place]) 08/12/2020  ? 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 DVT (deep venous thrombosis) ([place]) 08/12/2020  ? COVID-19 virus infection 08/12/2020  ? Acute lower UTI 07/14/2020  ? Delirium 07/06/2020  ? Sleep apnea 07/06/2020  ? GERD (gastroesophageal reflux disease) 07/06/2020  ? 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[place]) 06/25/2020  ? Sundowning 06/25/2020  ? Slow transit constipation   ? Essential hypertension   ? Hypokalemia   ? Postoperative pain   ? Meningioma ([place]) 05/02/2020  ? Brain tumor ([place]) 04/28/2020  ? S/P nasal septoplasty 03/19/2020  ? Special screening for malignant neoplasms, colon 01/09/2018  ? ? ?ONSET DATE: 04/28/20 ? ?REFERRING DIAG: Meningioma, Tetraplegia ? ?THERAPY DIAG:  ?Muscle weakness (generalized) ? ?Abnormal posture ? ?Stiffness of right shoulder, not elsewhere classified ? ?Other lack  of coordination ? ?Stiffness of right elbow, not elsewhere classified ? ?Other disturbances of skin sensation ? ? ?PERTINENT HISTORY: traumatic SDH 2008 ? ?PRECAUTIONS: Fall Risk ? ?SUBJECTIVE: Patient and wife report attempted "dry run" shower transfer.  Still problem solving set up.  Patient's wife indicates patient is now using boxers versus incont. Brief.   ? ?PAIN:  ?Are you having pain? No ? ? ? ? ?OBJECTIVE:  ? ?TODAY'S TREATMENT:  ? ?05/14/21: ?Shower transfer:  Continuing to work on improving comfort and ability to safely perform shower transfers.  Patient and wife reporting difficulty navigating stedy lift in bathroom - to reach shower seat  commode position is hindrance.  Practiced squat pivot transfer from standard wheelchair to see if that would be a reasonable.   ?Patient able to pull to stand from lower wheelchair with [employer] lift.   ?Patient able to apply and remove brake with right hand this session.   ?05/12/21:  ?Standing in parallel bars with emphasis on hip, knee and trunk extension to allow patient to use hands in activity versus only for support.  Worked on standing tall - then using right hand to build block tower of 2.  Patient able to stand upright for 10  seconds at a time, then needed cueing/facilitation to become more active - difficulty sustaining muscle contractions.  Use of hands forced issue to utilize trunk and LE muscles for longer periods of time.   ?Patient able to stand withut assistance x 1 using parallel bars.   ?Functional use of RUE;  Working on hand to mouth pattern with [employer].  Stacking blocks, cone - with emphasis on object well into hand versus at fingertips.   ? ? ?05/07/21:   ?Per patient and PT completed level surface squat pivot transfer today without slide board.   ?Standing in parallel bars with emphasis on hip, knee and trunk extension to allow patient to use hands in activity versus only for support.  Worked on standing tall - then using left hand to build block tower.  Patient able to stand upright for 5 seconds at a time, then needed cueing/facilitation to become more active - difficulty sustaining muscle contractions.  Use of hands forced issue to utilize trunk and LE muscles for longer periods of time.   ?Functional use of RUE - seated at table working on orienting hand to various card tasks - flipping card, shuffling cards, dealing cards, cleaning/restacking and putting back in box.  Patient with motor delay, but improved use of hand in these tasks today.   ? ? ? ? ? ? ? ? ? ? ? ? ? ? OT Short Term Goals - 04/01/21 1645   ? ?  ? OT SHORT TERM GOAL #1  ? Title Patient and caregiver will complete HEP  designed to improve passive and active range of motion in BUE   ? Time 4   ? Period Weeks   ? Status Ongoing  ? Target Date 05/16/21   ?  ? OT SHORT TERM GOAL #2  ? Title Patient will utilize RUE to assist with at least one ADL task with mod cueing and minimal assistance   ? Time 4   ? Period Weeks   ? Status Achieved  ?  ? OT SHORT TERM GOAL #3  ? Title Patient will reach at least mid calf toward feet with BUE in preparation for participating in LB dressing   ? Time 4   ? Period Weeks   ?  Status Achieved  ?  ? OT SHORT TERM GOAL #4  ? Title Patient will demonstrate sufficient lateral lean left or right in seated position to prepare for placement of sliding board to encourage more active transfers and increase potential for commode transfers.   ? Time 4   ? Period Weeks   ? Status Achieved  ?  ? OT SHORT TERM GOAL #5  ? Title Pt will be educated on available AE and DME to assist with ADL completion as needed for improved independence.   ? Time 4   ? Status Achieved  ? ?  ?  ? ?  ? ? ? OT Long Term Goals - 04/01/21 1652   ? ?  ? OT LONG TERM GOAL #1  ? Title Patient will complete updated HEP designed to improve range, strength, and functional use of BUE   ? Time 8   ? Period Weeks   ? Status Ongoing  ? Target Date 06/15/21   ?  ? OT LONG TERM GOAL #2  ? Title Patient will complete an active transfer level surface with slide board toward right with max assistance in preparation for commode transfer   ? Time 8   ? Period Weeks   ? Status Ongoing  ?  ? OT LONG TERM GOAL #3  ? Title Patient will demonstrate improved coordination in RUE as evidenced by at least 8 blocks (box in blocks) in one minute   ? Baseline 4   ? Time 8   ? Period Weeks   ? Status Ongoing  ?  ? OT LONG TERM GOAL #4  ? Title Patient will demonstrate a 4lb increase in grip strength in RUE   ? Baseline 6.1   ? Time 8   ? Period Weeks   ? Status Ongoing  ?  ? OT LONG TERM GOAL #5  ? Title Per patient and wife, patient will complete at least 30% more of  bathing and dressing tasks   ? Time 8   ? Period Weeks   ? Status Ongoing  ? ?  ?  ? ?  ? ? ? Plan - 04/14/21 1535   ? ? Clinical Impression Statement Patient continues to show improved strength and postural

## 2021-05-19 ENCOUNTER — Ambulatory Visit: Payer: Medicare Other | Attending: Internal Medicine | Admitting: Occupational Therapy

## 2021-05-19 ENCOUNTER — Ambulatory Visit: Payer: Medicare Other | Admitting: Rehabilitation

## 2021-05-19 ENCOUNTER — Encounter: Payer: Self-pay | Admitting: Occupational Therapy

## 2021-05-19 DIAGNOSIS — M6281 Muscle weakness (generalized): Secondary | ICD-10-CM | POA: Diagnosis present

## 2021-05-19 DIAGNOSIS — R2689 Other abnormalities of gait and mobility: Secondary | ICD-10-CM | POA: Diagnosis present

## 2021-05-19 DIAGNOSIS — R293 Abnormal posture: Secondary | ICD-10-CM | POA: Diagnosis present

## 2021-05-19 DIAGNOSIS — M25611 Stiffness of right shoulder, not elsewhere classified: Secondary | ICD-10-CM | POA: Diagnosis present

## 2021-05-19 DIAGNOSIS — R2681 Unsteadiness on feet: Secondary | ICD-10-CM | POA: Diagnosis present

## 2021-05-19 DIAGNOSIS — R278 Other lack of coordination: Secondary | ICD-10-CM | POA: Diagnosis present

## 2021-05-19 DIAGNOSIS — M25621 Stiffness of right elbow, not elsewhere classified: Secondary | ICD-10-CM | POA: Diagnosis present

## 2021-05-19 DIAGNOSIS — R208 Other disturbances of skin sensation: Secondary | ICD-10-CM | POA: Diagnosis present

## 2021-05-19 DIAGNOSIS — R29898 Other symptoms and signs involving the musculoskeletal system: Secondary | ICD-10-CM | POA: Insufficient documentation

## 2021-05-19 NOTE — Therapy (Signed)
?OUTPATIENT OCCUPATIONAL THERAPY TREATMENT  ? ? ?Patient Name: Anthony Downs ?MRN: [id number] ?DOB:09/24/1950, 71 y.o., male ?Today's Date: 05/19/2021 ? ?PCP: Redmond School, MD ?REFERRING PROVIDER: Redmond School, MD ? ? OT End of Session - 05/19/21 1238   ? ? Visit Number 14   ? Number of Visits 17   ? Date for OT Re-Evaluation 06/15/21   ? Authorization Type Medicare and UHC   ? Authorization Time Period UHC VL - Need medical auth after 30 visits   ? OT Start Time 1145   ? OT Stop Time 1230   ? OT Time Calculation (min) 45 min   ? Activity Tolerance Patient tolerated treatment well   ? Behavior During Therapy [facility] for tasks assessed/performed   ? ?  ?  ? ?  ? ? ? ? ?Past Medical History:  ?Diagnosis Date  ? Acid reflux   ? Anxiety   ? Arthritis   ? Asthma   ? as a child  ? Basal cell carcinoma 01/26/2021  ? RIGHT POST SURICULAR INFERIOR  ? Basal cell carcinoma 01/26/2021  ? RIGHT POST AURICULAR SUPERIOR  ? Benign brain tumor ([place])   ? Cancer [facility])   ? 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, squamous behind ear  ? Complication of anesthesia   ? slow to wake up and pain medicines make him sick  ? GERD (gastroesophageal reflux disease)   ? Hypercholesteremia   ? Hypertension   ? PONV (postoperative nausea and vomiting)   ? Sleep apnea   ? no Cpap  ? 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 fell off truck hit head on concrete  ? ?Past Surgical History:  ?Procedure Laterality Date  ? APPLICATION OF CRANIAL NAVIGATION Left 04/29/2020  ? Procedure: APPLICATION OF CRANIAL NAVIGATION;  Surgeon: Judith Part, MD;  Location: [place];  Service: Neurosurgery;  Laterality: Left;  ? arthroscopic knee    ? COLONOSCOPY N/A 11/29/2018  ? Procedure: COLONOSCOPY;  Surgeon: Rogene Houston, MD;  [facility];  Service: Endoscopy;  Laterality: N/A;  730-rescheduled 9/2 same time per Lelon Frohlich  ? CRANIOTOMY Left 04/29/2020  ? Procedure: LEFT CRANIECTOMY WITH TUMOR EXCISION;  Surgeon: Judith Part, MD;  Location:  [place];  Service: Neurosurgery;  Laterality: Left;  ? KNEE SURGERY Right   ? NASAL SEPTOPLASTY W/ TURBINOPLASTY Bilateral 03/19/2020  ? Procedure: NASAL SEPTOPLASTY WITH BILATERAL  TURBINATE REDUCTION;  Surgeon: Leta Baptist, MD;  Location: [place];  Service: ENT;  Laterality: Bilateral;  ? VASECTOMY    ? ?Patient Active Problem List  ? Diagnosis Date Noted  ? Seizure ([place]) 08/13/2020  ? Seizures ([place]) 08/12/2020  ? 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 DVT (deep venous thrombosis) ([place]) 08/12/2020  ? COVID-19 virus infection 08/12/2020  ? Acute lower UTI 07/14/2020  ? Delirium 07/06/2020  ? Sleep apnea 07/06/2020  ? GERD (gastroesophageal reflux disease) 07/06/2020  ? 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[place]) 06/25/2020  ? Sundowning 06/25/2020  ? Slow transit constipation   ? Essential hypertension   ? Hypokalemia   ? Postoperative pain   ? Meningioma ([place]) 05/02/2020  ? Brain tumor ([place]) 04/28/2020  ? S/P nasal septoplasty 03/19/2020  ? Special screening for malignant neoplasms, colon 01/09/2018  ? ? ?ONSET DATE: 04/28/20 ? ?REFERRING DIAG: Meningioma, Tetraplegia ? ?THERAPY DIAG:  ?Stiffness of right shoulder, not elsewhere classified ? ?Other lack of coordination ? ?Stiffness of right elbow, not elsewhere classified ? ?Other disturbances of skin sensation ? ?Muscle weakness (generalized) ? ?  Unsteadiness on feet ? ?Abnormal posture ? ?Other symptoms and signs involving the musculoskeletal system ? ? ?PERTINENT HISTORY: traumatic SDH 2008 ? ?PRECAUTIONS: Fall Risk ? ?SUBJECTIVE: Patient and wife report attempted "dry run" shower transfer.  Still problem solving set up.  Patient's wife indicates patient is now using boxers versus incont. Brief.   ? ?PAIN:  ?Are you having pain? No ? ? ? ? ?OBJECTIVE:  ? ?TODAY'S TREATMENT:  ?05/19/21: ?Standing in parallel bars with emphasis on hip, knee and trunk extension to allow patient to use hands in activity versus only for support.  Worked on standing tall - then using right hand to stack cones.  Patient  able to stand upright for 15-30 seconds at a time, then needed cueing/facilitation to become more active - difficulty sustaining muscle contractions.  Use of hands forced issue to utilize trunk and LE muscles for longer periods of time.   ?Patient able to stand without assistance x several times this session at parallel bars.   ?Working to decrease amplitude of tremor, by closing chain - sliding hand on table, bracing arm against body.   ? ?[employer]: Able to do upright chest press on lowest weight setting x 10 reps BUE - Min assist initially for RUE ? ?Shower transfer/ADL:  Patient and wife able to use Stedy to transfer onto shower bench at home.  Did not actually shower - very difficult to maneuver lift in confine of bathroom.  Wife reports patient dressed himself with very little help this morning!   ? ?05/14/21: ?Shower transfer:  Continuing to work on improving comfort and ability to safely perform shower transfers.  Patient and wife reporting difficulty navigating stedy lift in bathroom - to reach shower seat commode position is hindrance.  Practiced squat pivot transfer from standard wheelchair to see if that would be a reasonable.   ?Patient able to pull to stand from lower wheelchair with [facility] lift.   ?Patient able to apply and remove brake with right hand this session.   ? ?Functional use of RUE;  Working on hand to mouth pattern with [employer].  Stacking blocks, cone - with emphasis on object well into hand versus at fingertips.   ? ?  ? ? ? ? ? ? ? ? ? ? ? ? ? ? OT Short Term Goals - 04/01/21 1645   ? ?  ? OT SHORT TERM GOAL #1  ? Title Patient and caregiver will complete HEP designed to improve passive and active range of motion in BUE   ? Time 4   ? Period Weeks   ? Status Ongoing  ? Target Date 05/16/21   ?  ? OT SHORT TERM GOAL #2  ? Title Patient will utilize RUE to assist with at least one ADL task with mod cueing and minimal assistance   ? Time 4   ? Period Weeks   ? Status Achieved  ?  ?  OT SHORT TERM GOAL #3  ? Title Patient will reach at least mid calf toward feet with BUE in preparation for participating in LB dressing   ? Time 4   ? Period Weeks   ? Status Achieved  ?  ? OT SHORT TERM GOAL #4  ? Title Patient will demonstrate sufficient lateral lean left or right in seated position to prepare for placement of sliding board to encourage more active transfers and increase potential for commode transfers.   ? Time 4   ? Period Weeks   ?  Status Achieved  ?  ? OT SHORT TERM GOAL #5  ? Title Pt will be educated on available AE and DME to assist with ADL completion as needed for improved independence.   ? Time 4   ? Status Achieved  ? ?  ?  ? ?  ? ? ? OT Long Term Goals - 04/01/21 1652   ? ?  ? OT LONG TERM GOAL #1  ? Title Patient will complete updated HEP designed to improve range, strength, and functional use of BUE   ? Time 8   ? Period Weeks   ? Status Ongoing  ? Target Date 06/15/21   ?  ? OT LONG TERM GOAL #2  ? Title Patient will complete an active transfer level surface with slide board toward right with max assistance in preparation for commode transfer   ? Time 8   ? Period Weeks   ? Status Ongoing  ?  ? OT LONG TERM GOAL #3  ? Title Patient will demonstrate improved coordination in RUE as evidenced by at least 8 blocks (box in blocks) in one minute   ? Baseline 4   ? Time 8   ? Period Weeks   ? Status Ongoing  ?  ? OT LONG TERM GOAL #4  ? Title Patient will demonstrate a 4lb increase in grip strength in RUE   ? Baseline 6.1   ? Time 8   ? Period Weeks   ? Status Ongoing  ?  ? OT LONG TERM GOAL #5  ? Title Per patient and wife, patient will complete at least 30% more of bathing and dressing tasks   ? Time 8   ? Period Weeks   ? Status Ongoing  ? ?  ?  ? ?  ? ? ? Plan - 04/14/21 1535   ? ? Clinical Impression Statement Patient and wife report improved spontaneous use of RUE, and decreased need for assistance with bathing and dressing.      ? OT Occupational Profile and History Detailed  Assessment- Review of Records and additional review of physical, cognitive, psychosocial history related to current functional performance   ? Occupational performance deficits (Please refer to evaluation

## 2021-05-21 ENCOUNTER — Encounter: Payer: Self-pay | Admitting: Occupational Therapy

## 2021-05-21 ENCOUNTER — Ambulatory Visit: Payer: Medicare Other

## 2021-05-21 ENCOUNTER — Ambulatory Visit: Payer: Medicare Other | Admitting: Occupational Therapy

## 2021-05-21 ENCOUNTER — Ambulatory Visit: Payer: Medicare Other | Admitting: Physical Therapy

## 2021-05-21 DIAGNOSIS — R2681 Unsteadiness on feet: Secondary | ICD-10-CM

## 2021-05-21 DIAGNOSIS — M25621 Stiffness of right elbow, not elsewhere classified: Secondary | ICD-10-CM

## 2021-05-21 DIAGNOSIS — R208 Other disturbances of skin sensation: Secondary | ICD-10-CM

## 2021-05-21 DIAGNOSIS — R278 Other lack of coordination: Secondary | ICD-10-CM

## 2021-05-21 DIAGNOSIS — M25611 Stiffness of right shoulder, not elsewhere classified: Secondary | ICD-10-CM | POA: Diagnosis not present

## 2021-05-21 DIAGNOSIS — R2689 Other abnormalities of gait and mobility: Secondary | ICD-10-CM

## 2021-05-21 DIAGNOSIS — M6281 Muscle weakness (generalized): Secondary | ICD-10-CM

## 2021-05-21 NOTE — Therapy (Signed)
?OUTPATIENT OCCUPATIONAL THERAPY TREATMENT  ? ? ?Patient Name: Anthony Downs ?MRN: [id number] ?DOB:1951-01-04, 71 y.o., male ?Today's Date: 05/21/2021 ? ?PCP: Redmond School, MD ?REFERRING PROVIDER: Redmond School, MD ? ? OT End of Session - 05/21/21 1102   ? ? Visit Number 15   ? Number of Visits 17   ? Date for OT Re-Evaluation 06/15/21   ? Authorization Type Medicare and UHC   ? Authorization Time Period UHC VL - Need medical auth after 30 visits   ? OT Start Time 1100   ? OT Stop Time 1145   ? OT Time Calculation (min) 45 min   ? Activity Tolerance Patient tolerated treatment well   ? Behavior During Therapy [facility] for tasks assessed/performed   ? ?  ?  ? ?  ? ? ? ? ?Past Medical History:  ?Diagnosis Date  ? Acid reflux   ? Anxiety   ? Arthritis   ? Asthma   ? as a child  ? Basal cell carcinoma 01/26/2021  ? RIGHT POST SURICULAR INFERIOR  ? Basal cell carcinoma 01/26/2021  ? RIGHT POST AURICULAR SUPERIOR  ? Benign brain tumor ([place])   ? Cancer [facility])   ? 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, squamous behind ear  ? Complication of anesthesia   ? slow to wake up and pain medicines make him sick  ? GERD (gastroesophageal reflux disease)   ? Hypercholesteremia   ? Hypertension   ? PONV (postoperative nausea and vomiting)   ? Sleep apnea   ? no Cpap  ? 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 fell off truck hit head on concrete  ? ?Past Surgical History:  ?Procedure Laterality Date  ? APPLICATION OF CRANIAL NAVIGATION Left 04/29/2020  ? Procedure: APPLICATION OF CRANIAL NAVIGATION;  Surgeon: Judith Part, MD;  Location: [place];  Service: Neurosurgery;  Laterality: Left;  ? arthroscopic knee    ? COLONOSCOPY N/A 11/29/2018  ? Procedure: COLONOSCOPY;  Surgeon: Rogene Houston, MD;  [facility];  Service: Endoscopy;  Laterality: N/A;  730-rescheduled 9/2 same time per Lelon Frohlich  ? CRANIOTOMY Left 04/29/2020  ? Procedure: LEFT CRANIECTOMY WITH TUMOR EXCISION;  Surgeon: Judith Part, MD;  Location:  [place];  Service: Neurosurgery;  Laterality: Left;  ? KNEE SURGERY Right   ? NASAL SEPTOPLASTY W/ TURBINOPLASTY Bilateral 03/19/2020  ? Procedure: NASAL SEPTOPLASTY WITH BILATERAL  TURBINATE REDUCTION;  Surgeon: Leta Baptist, MD;  Location: [place];  Service: ENT;  Laterality: Bilateral;  ? VASECTOMY    ? ?Patient Active Problem List  ? Diagnosis Date Noted  ? Seizure ([place]) 08/13/2020  ? Seizures ([place]) 08/12/2020  ? 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 DVT (deep venous thrombosis) ([place]) 08/12/2020  ? COVID-19 virus infection 08/12/2020  ? Acute lower UTI 07/14/2020  ? Delirium 07/06/2020  ? Sleep apnea 07/06/2020  ? GERD (gastroesophageal reflux disease) 07/06/2020  ? 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[place]) 06/25/2020  ? Sundowning 06/25/2020  ? Slow transit constipation   ? Essential hypertension   ? Hypokalemia   ? Postoperative pain   ? Meningioma ([place]) 05/02/2020  ? Brain tumor ([place]) 04/28/2020  ? S/P nasal septoplasty 03/19/2020  ? Special screening for malignant neoplasms, colon 01/09/2018  ? ? ?ONSET DATE: 04/28/20 ? ?REFERRING DIAG: Meningioma, Tetraplegia ? ?THERAPY DIAG:  ?Stiffness of right shoulder, not elsewhere classified ? ?Other lack of coordination ? ?Stiffness of right elbow, not elsewhere classified ? ?Other disturbances of skin sensation ? ?Muscle weakness (generalized) ? ?  Unsteadiness on feet ? ? ?PERTINENT HISTORY: traumatic SDH 2008 ? ?PRECAUTIONS: Fall Risk ? ?SUBJECTIVE: Patient and wife report attempted "dry run" shower transfer.  Still problem solving set up.  Patient's wife indicates patient is now using boxers versus incont. Brief.   ? ?PAIN:  ?Are you having pain? No ? ? ? ? ?OBJECTIVE:  ? ?TODAY'S TREATMENT:  ?05/21/21 ?Sliding board transfer to left from power chair with max A and increased time today. Pt with difficulty with initiating lifting buttocks off of chair/mat for scooting. ? ?Worked for duration of therapy session working on anterior leaning to work on lifting for progressing towards increased  independence with transfers and ability to scoot for bed mobility.  ? ? ? ? ? ? ? ? ? ? ? ? ? ? OT Short Term Goals - 04/01/21 1645   ? ?  ? OT SHORT TERM GOAL #1  ? Title Patient and caregiver will complete HEP designed to improve passive and active range of motion in BUE   ? Time 4   ? Period Weeks   ? Status Ongoing  ? Target Date 05/16/21   ?  ? OT SHORT TERM GOAL #2  ? Title Patient will utilize RUE to assist with at least one ADL task with mod cueing and minimal assistance   ? Time 4   ? Period Weeks   ? Status Achieved  ?  ? OT SHORT TERM GOAL #3  ? Title Patient will reach at least mid calf toward feet with BUE in preparation for participating in LB dressing   ? Time 4   ? Period Weeks   ? Status Achieved  ?  ? OT SHORT TERM GOAL #4  ? Title Patient will demonstrate sufficient lateral lean left or right in seated position to prepare for placement of sliding board to encourage more active transfers and increase potential for commode transfers.   ? Time 4   ? Period Weeks   ? Status Achieved  ?  ? OT SHORT TERM GOAL #5  ? Title Pt will be educated on available AE and DME to assist with ADL completion as needed for improved independence.   ? Time 4   ? Status Achieved  ? ?  ?  ? ?  ? ? ? OT Long Term Goals - 04/01/21 1652   ? ?  ? OT LONG TERM GOAL #1  ? Title Patient will complete updated HEP designed to improve range, strength, and functional use of BUE   ? Time 8   ? Period Weeks   ? Status Ongoing  ? Target Date 06/15/21   ?  ? OT LONG TERM GOAL #2  ? Title Patient will complete an active transfer level surface with slide board toward right with max assistance in preparation for commode transfer   ? Time 8   ? Period Weeks   ? Status Ongoing  ?  ? OT LONG TERM GOAL #3  ? Title Patient will demonstrate improved coordination in RUE as evidenced by at least 8 blocks (box in blocks) in one minute   ? Baseline 4   ? Time 8   ? Period Weeks   ? Status Ongoing  ?  ? OT LONG TERM GOAL #4  ? Title Patient will  demonstrate a 4lb increase in grip strength in RUE   ? Baseline 6.1   ? Time 8   ? Period Weeks   ? Status Ongoing  ?  ?  OT LONG TERM GOAL #5  ? Title Per patient and wife, patient will complete at least 30% more of bathing and dressing tasks   ? Time 8   ? Period Weeks   ? Status Ongoing  ? ?  ?  ? ?  ? ? ? Plan - 04/14/21 1535   ? ? Clinical Impression Statement Pt with report of increased independence and participation with ADLs. Difficulty with initiating scoot and getting forward for lifting buttocks off mat.  ? OT Occupational Profile and History Detailed Assessment- Review of Records and additional review of physical, cognitive, psychosocial history related to current functional performance   ? Occupational performance deficits (Please refer to evaluation for details): ADL's;IADL's;Leisure   ? Body Structure / Function / Physical Skills ADL;Decreased knowledge of use of DME;Gait;Obesity;Strength;Tone;GMC;Dexterity;Balance;Body mechanics;Edema;Proprioception;UE functional use;Cardiopulmonary status limiting activity;Endurance;IADL;ROM;Continence;Coordination;Flexibility;Mobility;Sensation;FMC;Decreased knowledge of precautions;Muscle spasms   ? Rehab Potential Good   ? Clinical Decision Making Several treatment options, min-mod task modification necessary   ? Comorbidities Affecting Occupational Performance: May have comorbidities impacting occupational performance   ? Modification or Assistance to Complete Evaluation  Min-Moderate modification of tasks or assist with assess necessary to complete eval   ? OT Frequency 2x / week   ? OT Duration 8 weeks   ? OT Treatment/Interventions Self-care/ADL training;Aquatic Therapy;DME and/or AE instruction;Splinting;Balance training;Therapeutic activities;Therapeutic exercise;Cognitive remediation/compensation;Passive range of motion;Functional Mobility Training;Neuromuscular education;Electrical Stimulation;Energy conservation;Patient/family education;Manual Therapy   ?  Plan dynaimc sitting balance, progress with sliding board transfers, increasing indepedence with ADLs, begin looking at LTG - anticipate extending plan of care  ? Consulted and Agree with Plan of Care Patient;Famil

## 2021-05-21 NOTE — Therapy (Signed)
?OUTPATIENT PHYSICAL THERAPY  ? ?Patient Name: Anthony Downs ?MRN: [id number] ?DOB:Oct 22, 1950, 71 y.o., male ?Today's Date: 05/21/2021 ? ?PCP: Redmond School, MD ?REFERRING PROVIDER: [name], MD  ? ? PT End of Session - 05/21/21 1148   ? ? Visit Number 14   ? Number of Visits 17   ? Date for PT Re-Evaluation 05/31/21   ? Authorization Type Medicare and UHC (needs 10th visit progress note)   ? Progress Note Due on Visit 10   ? PT Start Time 1147   ? PT Stop Time 1238   ? PT Time Calculation (min) 51 min   ? Equipment Utilized During Treatment Gait belt   ? Activity Tolerance Patient tolerated treatment well   ? Behavior During Therapy [facility] for tasks assessed/performed   ? ?  ?  ? ?  ? ? ? ? ? ? ? ?Past Medical History:  ?Diagnosis Date  ? Acid reflux   ? Anxiety   ? Arthritis   ? Asthma   ? as a child  ? Basal cell carcinoma 01/26/2021  ? RIGHT POST SURICULAR INFERIOR  ? Basal cell carcinoma 01/26/2021  ? RIGHT POST AURICULAR SUPERIOR  ? Benign brain tumor ([place])   ? Cancer [facility])   ? 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, squamous behind ear  ? Complication of anesthesia   ? slow to wake up and pain medicines make him sick  ? GERD (gastroesophageal reflux disease)   ? Hypercholesteremia   ? Hypertension   ? PONV (postoperative nausea and vomiting)   ? Sleep apnea   ? no Cpap  ? 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 fell off truck hit head on concrete  ? ?Past Surgical History:  ?Procedure Laterality Date  ? APPLICATION OF CRANIAL NAVIGATION Left 04/29/2020  ? Procedure: APPLICATION OF CRANIAL NAVIGATION;  Surgeon: Judith Part, MD;  Location: [place];  Service: Neurosurgery;  Laterality: Left;  ? arthroscopic knee    ? COLONOSCOPY N/A 11/29/2018  ? Procedure: COLONOSCOPY;  Surgeon: Rogene Houston, MD;  [facility];  Service: Endoscopy;  Laterality: N/A;  730-rescheduled 9/2 same time per Lelon Frohlich  ? CRANIOTOMY Left 04/29/2020  ? Procedure: LEFT CRANIECTOMY WITH TUMOR EXCISION;  Surgeon:  Judith Part, MD;  Location: [place];  Service: Neurosurgery;  Laterality: Left;  ? KNEE SURGERY Right   ? NASAL SEPTOPLASTY W/ TURBINOPLASTY Bilateral 03/19/2020  ? Procedure: NASAL SEPTOPLASTY WITH BILATERAL  TURBINATE REDUCTION;  Surgeon: Leta Baptist, MD;  Location: [place];  Service: ENT;  Laterality: Bilateral;  ? VASECTOMY    ? ?Patient Active Problem List  ? Diagnosis Date Noted  ? Seizure ([place]) 08/13/2020  ? Seizures ([place]) 08/12/2020  ? 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 DVT (deep venous thrombosis) ([place]) 08/12/2020  ? COVID-19 virus infection 08/12/2020  ? Acute lower UTI 07/14/2020  ? Delirium 07/06/2020  ? Sleep apnea 07/06/2020  ? GERD (gastroesophageal reflux disease) 07/06/2020  ? 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[place]) 06/25/2020  ? Sundowning 06/25/2020  ? Slow transit constipation   ? Essential hypertension   ? Hypokalemia   ? Postoperative pain   ? Meningioma ([place]) 05/02/2020  ? Brain tumor ([place]) 04/28/2020  ? S/P nasal septoplasty 03/19/2020  ? Special screening for malignant neoplasms, colon 01/09/2018  ? ? ?REFERRING DIAG: D32.9 (ICD-10-CM) - Meningioma ([place]) G82.50 (ICD-10-CM) - 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[place]) R56.9 (ICD-10-CM) - Seizures ([place])   ? ?THERAPY DIAG:  ?Muscle weakness (generalized) ? ?Unsteadiness on feet ? ?Other  abnormalities of gait and mobility ? ?PERTINENT HISTORY: anxiety, OA, cancer, subdural hematoma  ?  ? ?PRECAUTIONS: Fall ? ?SUBJECTIVE: Pt reports he has not been standing as much at home recently. No new falls. Is having // bars installed at his house to assist w/standing and transfers.  ? ?PAIN:  ?Are you having pain? Yes: NPRS scale: 3/10 ?Pain location: Low back ?Pain description: achy  ? ? ?OBJECTIVE:  ?  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 There are mild chronic small-vessel ischemic changes of the white matter. There is atrophy, encephalomalacia and gliosis along the medial aspects of both  sides of the brain at the frontoparietal vertex, sequela of the previous tumor and subsequent resection. It appears that the midportion of the superior sagittal ?sinus has been resected. No evidence of tumor in the venous sinuses anterior or posterior to that. ?    ?  ?TODAY'S TREATMENT:  ?  NMR ?Handoff w/OT w/pt sitting edge of mat. Spent majority of session doing mass blocked practice of anterior weight shifting to clear hips from mat to improve slide board transfer and sit <>stands. Provided max verbal, tactile and visual cues to facilitate anterior weight shift but pt unable to clear hips from mat. Placed chair in front of patient to provide target to reach forward to, but pt no longer using momentum to shift anteriorly and rather bent forward to reach for chair.  ? ?Lateral slide board transfer from elevated mat to power wheelchair on R side w/max A x1 initially. Pt able to clear hips off mat throughout transfer but unable to shift weight to R side despite max multimodal cues. Pt unaware that he is able to clear hips off mat during transfer, indicative of poor body and spatial awareness. Obtained second therapist to complete transfer after 20 minutes as pt unable to scoot laterally, total A x2.  ? ? ?PATIENT EDUCATION: ?Education details: Practicing anterior weight shift at home using [place] as target to reach for  ?Person educated: Patient and Spouse ?Education method: Explanation, Demonstration, Tactile cues, and Verbal cues ?Education comprehension: verbalized understanding and needs further education ? ? ?  ?HOME EXERCISE PROGRAM: ?[email or web address] ?  ?ASSESSMENT: ?  ?CLINICAL IMPRESSION: ?Emphasis of skilled PT session on blocked practice of anterior weight shift for improved hip clearance for slide board and sit <>stand transfers. Pt unable to clear hips from mat despite max multimodal cues, yet lifted his hips from mat during slide board  transfer. Pt seemingly unaware that he is properly lifting hips from mat during transfer. Pt required total A x2 to perform slide board transfer to R side today due to inability to scoot laterally. Encouraged pt and wife to practice anterior weight shift using [place] as a target to reach for at home for improved retention. Continue POC.  ? ?OBJECTIVE IMPAIRMENTS decreased activity tolerance, decreased balance, decreased coordination, decreased endurance, decreased knowledge of use of DME, decreased mobility, difficulty walking, decreased ROM, decreased strength, hypomobility, impaired perceived functional ability, impaired flexibility, impaired UE functional use, and postural dysfunction.  ?  ?ACTIVITY LIMITATIONS cleaning, community activity, meal prep, laundry, and medication management.  ?  ?PERSONAL FACTORS Time since onset of injury/illness/exacerbation and 3+ comorbidities: anxiety, OA, cancer, subdural hematoma  are also affecting patient's functional outcome.  ?  ?  ?REHAB POTENTIAL: Fair due to amount of time since onset and previous progress with PT ?  ?CLINICAL DECISION MAKING: Evolving/moderate  complexity ?  ?EVALUATION COMPLEXITY: Moderate ?  ?  ?GOALS: ?Goals reviewed with patient? Yes ?  ?SHORT TERM GOALS: Target date: 04/29/2021 ?  ?Pt/caregiver will be IND with HEP in order to indicate improved functional mobility and improved flexibility. ?Baseline:  ?Goal status: MET ?  ?2.  Pt will perform rolling R at min A level and L at mod/max A level in order to indicate improved strength and independence with bed mobility.  ?Baseline:  ?Goal status: MET ?  ?3.  Pt will perform SL>sit at mod A level in order to indicate improved functional mobility.  ?Baseline:  ?Goal status: MET ?  ?4.  Pt will perform sit>SL/supine at min A level in order to indicate improved functional mobility.   ?Baseline:  ?Goal status: MET ?  ?5.  Pt will perform slideboard transers at max A level in order to indicate improved  functional mobility.   ?Baseline:  ?Goal status: MET ?  ?6.  Pt will tolerate standing with use of stedy x 5 mins in order to indicate improved standing tolerance and improved BLE flexibility.   ?Baseline:

## 2021-05-25 ENCOUNTER — Other Ambulatory Visit: Payer: Self-pay | Admitting: Neurological Surgery

## 2021-05-25 DIAGNOSIS — D496 Neoplasm of unspecified behavior of brain: Secondary | ICD-10-CM

## 2021-05-26 ENCOUNTER — Ambulatory Visit: Payer: Medicare Other | Admitting: Occupational Therapy

## 2021-05-26 ENCOUNTER — Ambulatory Visit: Payer: Medicare Other | Admitting: Rehabilitation

## 2021-05-26 ENCOUNTER — Encounter: Payer: Self-pay | Admitting: Rehabilitation

## 2021-05-26 DIAGNOSIS — R293 Abnormal posture: Secondary | ICD-10-CM

## 2021-05-26 DIAGNOSIS — R29898 Other symptoms and signs involving the musculoskeletal system: Secondary | ICD-10-CM

## 2021-05-26 DIAGNOSIS — R2689 Other abnormalities of gait and mobility: Secondary | ICD-10-CM

## 2021-05-26 DIAGNOSIS — M25611 Stiffness of right shoulder, not elsewhere classified: Secondary | ICD-10-CM | POA: Diagnosis not present

## 2021-05-26 DIAGNOSIS — M6281 Muscle weakness (generalized): Secondary | ICD-10-CM

## 2021-05-26 DIAGNOSIS — R278 Other lack of coordination: Secondary | ICD-10-CM

## 2021-05-26 DIAGNOSIS — R2681 Unsteadiness on feet: Secondary | ICD-10-CM

## 2021-05-26 DIAGNOSIS — R208 Other disturbances of skin sensation: Secondary | ICD-10-CM

## 2021-05-26 DIAGNOSIS — M25621 Stiffness of right elbow, not elsewhere classified: Secondary | ICD-10-CM

## 2021-05-26 NOTE — Therapy (Signed)
?OUTPATIENT PHYSICAL THERAPY  ? ?Patient Name: Anthony Downs ?MRN: [id number] ?DOB:[birth year], 71 y.o., male ?Today's Date: 05/26/2021 ? ?PCP: Redmond School, MD ?REFERRING PROVIDER: [name], MD  ? ? PT End of Session - 05/26/21 1241   ? ? Visit Number 15   ? Number of Visits 17   ? Date for PT Re-Evaluation 05/31/21   ? Authorization Type Medicare and UHC (needs 10th visit progress note)   ? Progress Note Due on Visit 10   ? PT Start Time 1145   ? PT Stop Time 1230   ? PT Time Calculation (min) 45 min   ? Equipment Utilized During Treatment Gait belt   ? Activity Tolerance Patient tolerated treatment well   ? Behavior During Therapy [facility] for tasks assessed/performed   ? ?  ?  ? ?  ? ? ? ? ? ? ? ?Past Medical History:  ?Diagnosis Date  ? Acid reflux   ? Anxiety   ? Arthritis   ? Asthma   ? as a child  ? Basal cell carcinoma 01/26/2021  ? RIGHT POST SURICULAR INFERIOR  ? Basal cell carcinoma 01/26/2021  ? RIGHT POST AURICULAR SUPERIOR  ? Benign brain tumor ([place])   ? Cancer [facility])   ? 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, squamous behind ear  ? Complication of anesthesia   ? slow to wake up and pain medicines make him sick  ? GERD (gastroesophageal reflux disease)   ? Hypercholesteremia   ? Hypertension   ? PONV (postoperative nausea and vomiting)   ? Sleep apnea   ? no Cpap  ? 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 fell off truck hit head on concrete  ? ?Past Surgical History:  ?Procedure Laterality Date  ? APPLICATION OF CRANIAL NAVIGATION Left 04/29/2020  ? Procedure: APPLICATION OF CRANIAL NAVIGATION;  Surgeon: Judith Part, MD;  Location: [place];  Service: Neurosurgery;  Laterality: Left;  ? arthroscopic knee    ? COLONOSCOPY N/A 11/29/2018  ? Procedure: COLONOSCOPY;  Surgeon: Rogene Houston, MD;  [facility];  Service: Endoscopy;  Laterality: N/A;  730-rescheduled 9/2 same time per Lelon Frohlich  ? CRANIOTOMY Left 04/29/2020  ? Procedure: LEFT CRANIECTOMY WITH TUMOR EXCISION;  Surgeon:  Judith Part, MD;  Location: [place];  Service: Neurosurgery;  Laterality: Left;  ? KNEE SURGERY Right   ? NASAL SEPTOPLASTY W/ TURBINOPLASTY Bilateral 03/19/2020  ? Procedure: NASAL SEPTOPLASTY WITH BILATERAL  TURBINATE REDUCTION;  Surgeon: Leta Baptist, MD;  Location: [place];  Service: ENT;  Laterality: Bilateral;  ? VASECTOMY    ? ?Patient Active Problem List  ? Diagnosis Date Noted  ? Seizure ([place]) 08/13/2020  ? Seizures ([place]) 08/12/2020  ? 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 DVT (deep venous thrombosis) ([place]) 08/12/2020  ? COVID-19 virus infection 08/12/2020  ? Acute lower UTI 07/14/2020  ? Delirium 07/06/2020  ? Sleep apnea 07/06/2020  ? GERD (gastroesophageal reflux disease) 07/06/2020  ? 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[place]) 06/25/2020  ? Sundowning 06/25/2020  ? Slow transit constipation   ? Essential hypertension   ? Hypokalemia   ? Postoperative pain   ? Meningioma ([place]) 05/02/2020  ? Brain tumor ([place]) 04/28/2020  ? S/P nasal septoplasty 03/19/2020  ? Special screening for malignant neoplasms, colon 01/09/2018  ? ? ?REFERRING DIAG: D32.9 (ICD-10-CM) - Meningioma ([place]) G82.50 (ICD-10-CM) - 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[place]) R56.9 (ICD-10-CM) - Seizures ([place])   ? ?THERAPY DIAG:  ?Muscle weakness (generalized) ? ?Unsteadiness on feet ? ?Other  abnormalities of gait and mobility ? ?Abnormal posture ? ?PERTINENT HISTORY: anxiety, OA, cancer, subdural hematoma  ?  ? ?PRECAUTIONS: Fall ? ?SUBJECTIVE: Pt reports he has not been standing as much at home recently. No new falls. Is having // bars installed at his house to assist w/standing and transfers.  ? ?PAIN:  ?Are you having pain? Yes: NPRS scale: 3/10 ?Pain location: Low back ?Pain description: achy  ? ? ?OBJECTIVE:  ?  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 There are mild chronic small-vessel ischemic changes of the white matter. There is atrophy, encephalomalacia and gliosis along the  medial aspects of both sides of the brain at the frontoparietal vertex, sequela of the previous tumor and subsequent resection. It appears that the midportion of the superior sagittal ?sinus has been resected. No evidence of tumor in the venous sinuses anterior or posterior to that. ?    ?  ?TODAY'S TREATMENT:  ?  NMR ?Had pt pull power w/c in front of elevated hi/lo mat in order to work on forward weight shift.  Had pt come forward, place forearms on mat and lift buttocks from chair.  Pt needed max fading to mod A to lift (doing better once movement initiated).  In this position, we worked on lateral weight shifts, taking cones from across midline and stacking to opposite side (PT assisting with RUE but he is able to grasp cone).  Then had him push up into full stand (pt unable to get fully erect due to knee and hip contractures).  While in standing, had him perform weight shifts, again retrieving cone from across midline placing to the L.  Cues for lifting chest and squeezing hips and legs.  PT did use mirror in front of him for improved feedback.  When cued to look at it did seem to help somewhat.  Performed transitions from elbows to stand back to elbows x 5 reps (2 sets).  Also had pt go from slumped position to forward sitting to initiate anterior pelvic shift.  Did note activation in pelvis for several reps.  PT blocking R knee throughout for safety.   ? ?Pt and wife report that they are installing // bars at their home sometime in the next week.  They would like to work on standing at home.  Discussed safety concerns and that we should definitely practice here before they do at home.  Both verbalized agreement.   ? ? ?PATIENT EDUCATION: ?Education details: Practicing anterior weight shift at home using [place] as target to reach for  ?Person educated: Patient and Spouse ?Education method: Explanation, Demonstration, Tactile cues, and Verbal cues ?Education comprehension: verbalized understanding and needs  further education ? ? ?  ?HOME EXERCISE PROGRAM: ?[email or web address] ?  ?ASSESSMENT: ?  ?CLINICAL IMPRESSION: ?Skilled session focused on forward weight shift, increasing WB through LEs, improving LE push off/muscle activation and improving pelvic mobility.  He did very well and PT did see activation at pelvis into anterior shift during tasks.   ? ?OBJECTIVE IMPAIRMENTS decreased activity tolerance, decreased balance, decreased coordination, decreased endurance, decreased knowledge of use of DME, decreased mobility, difficulty walking, decreased ROM, decreased strength, hypomobility, impaired perceived functional ability, impaired flexibility, impaired UE functional use, and postural dysfunction.  ?  ?ACTIVITY LIMITATIONS cleaning, community activity, meal prep, laundry, and medication management.  ?  ?PERSONAL FACTORS Time since onset of injury/illness/exacerbation and 3+ comorbidities: anxiety, OA, cancer, subdural hematoma  are also affecting patient's functional outcome.  ?  ?  ?REHAB POTENTIAL: Fair due to amount of time since onset and previous progress with PT ?  ?CLINICAL DECISION MAKING: Evolving/moderate complexity ?  ?EVALUATION COMPLEXITY: Moderate ?  ?  ?GOALS: ?Goals reviewed with patient? Yes ?  ?SHORT TERM GOALS: Target date: 04/29/2021 ?  ?Pt/caregiver will be IND with HEP in order to indicate improved functional mobility and improved flexibility. ?Baseline:  ?Goal status: MET ?  ?2.  Pt will perform rolling R at min A level and L at mod/max A level in order to indicate improved strength and independence with bed mobility.  ?Baseline:  ?Goal status: MET ?  ?3.  Pt will perform SL>sit at mod A level in order to indicate improved functional mobility.  ?Baseline:  ?Goal status: MET ?  ?4.  Pt will perform sit>SL/supine at min A level in order to indicate improved functional mobility.   ?Baseline:  ?Goal status: MET ?  ?5.  Pt will perform  slideboard transers at max A level in order to indicate improved functional mobility.   ?Baseline:  ?Goal status: MET ?  ?6.  Pt will tolerate standing with use of stedy x 5 mins in order to indicate impro

## 2021-05-26 NOTE — Patient Instructions (Signed)
Flip playing cards with your right hand ? ?Pick up small  plastic bottles or containers with R hand to place in a container ? ?Bend and straighten your fingers assisting with your left hand 3x day ? ?Touch each finger with your thumb for right hand ?

## 2021-05-26 NOTE — Therapy (Signed)
?OUTPATIENT OCCUPATIONAL THERAPY TREATMENT  ? ? ?Patient Name: Anthony Downs ?MRN: [id number] ?DOB:22-Jul-1950, 71 y.o., male ?Today's Date: 05/26/2021 ? ?PCP: Redmond School, MD ?REFERRING PROVIDER: Redmond School, MD ? ? ? ?OUTPATIENT OCCUPATIONAL THERAPY TREATMENT NOTE ? ? ?Patient Name: Anthony Downs ?MRN: [id number] ?DOB:1950-06-27, 71 y.o., male ?Today's Date: 05/26/2021 ? ?Referring MD:Dr Naaman Plummer ? ?END OF SESSION:  ? OT End of Session - 05/26/21 1252   ? ? Visit Number 16   ? Number of Visits 17   ? Date for OT Re-Evaluation 06/15/21   ? Authorization Type Medicare and UHC   ? Authorization Time Period UHC VL - Need medical auth after 30 visits   ? OT Start Time 1106   ? OT Stop Time 1145   ? OT Time Calculation (min) 39 min   ? Activity Tolerance Patient tolerated treatment well   ? Behavior During Therapy [facility] for tasks assessed/performed   ? ?  ?  ? ?  ? ? ? ? ?ONSET DATE: 04/28/20 ? ?REFERRING DIAG: Meningioma, Tetraplegia ? ?THERAPY DIAG:  ?Muscle weakness (generalized) ? ?Abnormal posture ? ?Stiffness of right shoulder, not elsewhere classified ? ?Other lack of coordination ? ?Stiffness of right elbow, not elsewhere classified ? ?Other disturbances of skin sensation ? ?Other symptoms and signs involving the musculoskeletal system ? ? ? ?  ? ?  ? ? ? ?Past Medical History:  ?Diagnosis Date  ? Acid reflux   ? Anxiety   ? Arthritis   ? Asthma   ? as a child  ? Basal cell carcinoma 01/26/2021  ? RIGHT POST SURICULAR INFERIOR  ? Basal cell carcinoma 01/26/2021  ? RIGHT POST AURICULAR SUPERIOR  ? Benign brain tumor ([place])   ? Cancer [facility])   ? 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, squamous behind ear  ? Complication of anesthesia   ? slow to wake up and pain medicines make him sick  ? GERD (gastroesophageal reflux disease)   ? Hypercholesteremia   ? Hypertension   ? PONV (postoperative nausea and vomiting)   ? Sleep apnea   ? no Cpap  ? 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 fell off truck hit head on  concrete  ? ?Past Surgical History:  ?Procedure Laterality Date  ? APPLICATION OF CRANIAL NAVIGATION Left 04/29/2020  ? Procedure: APPLICATION OF CRANIAL NAVIGATION;  Surgeon: Judith Part, MD;  Location: [place];  Service: Neurosurgery;  Laterality: Left;  ? arthroscopic knee    ? COLONOSCOPY N/A 11/29/2018  ? Procedure: COLONOSCOPY;  Surgeon: Rogene Houston, MD;  [facility];  Service: Endoscopy;  Laterality: N/A;  730-rescheduled 9/2 same time per Lelon Frohlich  ? CRANIOTOMY Left 04/29/2020  ? Procedure: LEFT CRANIECTOMY WITH TUMOR EXCISION;  Surgeon: Judith Part, MD;  Location: [place];  Service: Neurosurgery;  Laterality: Left;  ? KNEE SURGERY Right   ? NASAL SEPTOPLASTY W/ TURBINOPLASTY Bilateral 03/19/2020  ? Procedure: NASAL SEPTOPLASTY WITH BILATERAL  TURBINATE REDUCTION;  Surgeon: Leta Baptist, MD;  Location: [place];  Service: ENT;  Laterality: Bilateral;  ? VASECTOMY    ? ?Patient Active Problem List  ? Diagnosis Date Noted  ? Seizure ([place]) 08/13/2020  ? Seizures ([place]) 08/12/2020  ? 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 DVT (deep venous thrombosis) ([place]) 08/12/2020  ? COVID-19 virus infection 08/12/2020  ? Acute lower UTI 07/14/2020  ? Delirium 07/06/2020  ? Sleep apnea 07/06/2020  ? GERD (gastroesophageal reflux disease) 07/06/2020  ? 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[place])  06/25/2020  ? Sundowning 06/25/2020  ? Slow transit constipation   ? Essential hypertension   ? Hypokalemia   ? Postoperative pain   ? Meningioma ([place]) 05/02/2020  ? Brain tumor ([place]) 04/28/2020  ? S/P nasal septoplasty 03/19/2020  ? Special screening for malignant neoplasms, colon 01/09/2018  ? ? ?ONSET DATE: 04/28/20 ? ?REFERRING DIAG: Meningioma, Tetraplegia ? ? ? ? ?PERTINENT HISTORY: traumatic SDH 2008 ? ?PRECAUTIONS: Fall Risk ? ?SUBJECTIVE: Pt's wife reports they are going to hold off on shower transfers for now due to bathroom is really small  ? ?PAIN:  ?Are you having pain? No ? ? ? ? ?Treatment: Seated at table, retrograde massage and  P/ROM to right hand and digits ?Composite and individual finger flexion, followed by pt self ROM, A/ROM wrist flexion/ extension , elbow flexion extension and tableslides for shoulder flexion, min v.c and facilitation ?Pt performed finger thumb opposition, grasp release of small bottles and flipping and dealing playing cards therapist issued for home.( mod/ max difficulty with dealing cards). ?Therapist started checking progress towards goals as current POC ends this week. ? ?Education: Updated HEP, see pt instruction, pt returned demonstration with min v.c ? ? ? ? ? ? ? OT Short Term Goals - 04/01/21 1645   ? ?  ? OT SHORT TERM GOAL #1  ? Title Patient and caregiver will complete HEP designed to improve passive and active range of motion in BUE   ? Time 4   ? Period Weeks   ? Status Ongoing  ? Target Date 05/16/21   ?  ? OT SHORT TERM GOAL #2  ? Title Patient will utilize RUE to assist with at least one ADL task with mod cueing and minimal assistance   ? Time 4   ? Period Weeks   ? Status Achieved  ?  ? OT SHORT TERM GOAL #3  ? Title Patient will reach at least mid calf toward feet with BUE in preparation for participating in LB dressing   ? Time 4   ? Period Weeks   ? Status Achieved  ?  ? OT SHORT TERM GOAL #4  ? Title Patient will demonstrate sufficient lateral lean left or right in seated position to prepare for placement of sliding board to encourage more active transfers and increase potential for commode transfers.   ? Time 4   ? Period Weeks   ? Status Achieved  ?  ? OT SHORT TERM GOAL #5  ? Title Pt will be educated on available AE and DME to assist with ADL completion as needed for improved independence.   ? Time 4   ? Status Achieved  ? ?  ?  ? ?  ? ? ? OT Long Term Goals - 04/01/21 1652   ? ?  ? OT LONG TERM GOAL #1  ? Title Patient will complete updated HEP designed to improve range, strength, and functional use of BUE   ? Time 8   ? Period Weeks   ? Status Ongoing  ? Target Date 06/15/21   ?  ? OT  LONG TERM GOAL #2  ? Title Patient will complete an active transfer level surface with slide board toward right with max assistance in preparation for commode transfer   ? Time 8   ? Period Weeks   ? Status Ongoing  ?  ? OT LONG TERM GOAL #3  ? Title Patient will demonstrate improved coordination in RUE as evidenced by at least 8 blocks (  box in blocks) in one minute   ? Baseline 4   ? Time 8   ? Period Weeks   ? Status Achieved 9 blocks  ?  ? OT LONG TERM GOAL #4  ? Title Patient will demonstrate a 4lb increase in grip strength in RUE   ? Baseline 6.1   ? Time 8   ? Period Weeks   ? Status Ongoing-7.9 lbs   ?  ? OT LONG TERM GOAL #5  ? Title Per patient and wife, patient will complete at least 30% more of bathing and dressing tasks   ? Time 8   ? Period Weeks   ? Status Achieved per pt wife-60-70%  ? ?  ?  ? ?  ? ? ? Plan - 04/14/21 1535   ? ? Clinical Impression Statement Pt demonstrates improved functional use of RUE at end of session.  ? OT Occupational Profile and History Detailed Assessment- Review of Records and additional review of physical, cognitive, psychosocial history related to current functional performance   ? Occupational performance deficits (Please refer to evaluation for details): ADL's;IADL's;Leisure   ? Body Structure / Function / Physical Skills ADL;Decreased knowledge of use of DME;Gait;Obesity;Strength;Tone;GMC;Dexterity;Balance;Body mechanics;Edema;Proprioception;UE functional use;Cardiopulmonary status limiting activity;Endurance;IADL;ROM;Continence;Coordination;Flexibility;Mobility;Sensation;FMC;Decreased knowledge of precautions;Muscle spasms   ? Rehab Potential Good   ? Clinical Decision Making Several treatment options, min-mod task modification necessary   ? Comorbidities Affecting Occupational Performance: May have comorbidities impacting occupational performance   ? Modification or Assistance to Complete Evaluation  Min-Moderate modification of tasks or assist with assess necessary  to complete eval   ? OT Frequency 2x / week   ? OT Duration 8 weeks   ? OT Treatment/Interventions Self-care/ADL training;Aquatic Therapy;DME and/or AE instruction;Splinting;Balance training;Therapeutic act

## 2021-05-28 ENCOUNTER — Ambulatory Visit: Payer: Medicare Other | Admitting: Physical Therapy

## 2021-05-28 ENCOUNTER — Ambulatory Visit: Payer: Medicare Other | Admitting: Occupational Therapy

## 2021-05-28 ENCOUNTER — Encounter: Payer: Self-pay | Admitting: Occupational Therapy

## 2021-05-28 DIAGNOSIS — R2681 Unsteadiness on feet: Secondary | ICD-10-CM

## 2021-05-28 DIAGNOSIS — R29898 Other symptoms and signs involving the musculoskeletal system: Secondary | ICD-10-CM

## 2021-05-28 DIAGNOSIS — M25611 Stiffness of right shoulder, not elsewhere classified: Secondary | ICD-10-CM

## 2021-05-28 DIAGNOSIS — M25621 Stiffness of right elbow, not elsewhere classified: Secondary | ICD-10-CM

## 2021-05-28 DIAGNOSIS — R208 Other disturbances of skin sensation: Secondary | ICD-10-CM

## 2021-05-28 DIAGNOSIS — M6281 Muscle weakness (generalized): Secondary | ICD-10-CM

## 2021-05-28 DIAGNOSIS — R2689 Other abnormalities of gait and mobility: Secondary | ICD-10-CM

## 2021-05-28 DIAGNOSIS — R278 Other lack of coordination: Secondary | ICD-10-CM

## 2021-05-28 DIAGNOSIS — R293 Abnormal posture: Secondary | ICD-10-CM

## 2021-05-28 NOTE — Therapy (Signed)
?OUTPATIENT PHYSICAL THERAPY TREATMENT NOTE- RECERT ? ?Patient Name: Anthony Downs ?MRN: [id number] ?DOB:08-24-1950, 71 y.o., male ?Today's Date: 05/28/2021 ? ?PCP: Redmond School, MD ?REFERRING PROVIDER: [name], MD  ? ? PT End of Session - 05/28/21 1522   ? ? Visit Number 16   ? Number of Visits 17   ? Date for PT Re-Evaluation 40/81/44   Recert  ? Authorization Type Medicare and UHC (needs 10th visit progress note)   ? Progress Note Due on Visit 20   ? PT Start Time 1520   ? PT Stop Time 1612   ? PT Time Calculation (min) 52 min   ? Equipment Utilized During Treatment --   ? Activity Tolerance Patient tolerated treatment well   ? Behavior During Therapy [name] for tasks assessed/performed   ? ?  ?  ? ?  ? ? ? ? ? ? ? ? ?Past Medical History:  ?Diagnosis Date  ? Acid reflux   ? Anxiety   ? Arthritis   ? Asthma   ? as a child  ? Basal cell carcinoma 01/26/2021  ? RIGHT POST SURICULAR INFERIOR  ? Basal cell carcinoma 01/26/2021  ? RIGHT POST AURICULAR SUPERIOR  ? Benign brain tumor ([place])   ? Cancer [facility])   ? 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, squamous behind ear  ? Complication of anesthesia   ? slow to wake up and pain medicines make him sick  ? GERD (gastroesophageal reflux disease)   ? Hypercholesteremia   ? Hypertension   ? PONV (postoperative nausea and vomiting)   ? Sleep apnea   ? no Cpap  ? 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 fell off truck hit head on concrete  ? ?Past Surgical History:  ?Procedure Laterality Date  ? APPLICATION OF CRANIAL NAVIGATION Left 04/29/2020  ? Procedure: APPLICATION OF CRANIAL NAVIGATION;  Surgeon: Judith Part, MD;  Location: [place];  Service: Neurosurgery;  Laterality: Left;  ? arthroscopic knee    ? COLONOSCOPY N/A 11/29/2018  ? Procedure: COLONOSCOPY;  Surgeon: Rogene Houston, MD;  [facility];  Service: Endoscopy;  Laterality: N/A;  730-rescheduled 9/2 same time per Lelon Frohlich  ? CRANIOTOMY Left 04/29/2020  ? Procedure: LEFT CRANIECTOMY WITH  TUMOR EXCISION;  Surgeon: Judith Part, MD;  Location: [place];  Service: Neurosurgery;  Laterality: Left;  ? KNEE SURGERY Right   ? NASAL SEPTOPLASTY W/ TURBINOPLASTY Bilateral 03/19/2020  ? Procedure: NASAL SEPTOPLASTY WITH BILATERAL  TURBINATE REDUCTION;  Surgeon: Leta Baptist, MD;  Location: [place];  Service: ENT;  Laterality: Bilateral;  ? VASECTOMY    ? ?Patient Active Problem List  ? Diagnosis Date Noted  ? Seizure ([place]) 08/13/2020  ? Seizures ([place]) 08/12/2020  ? 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 DVT (deep venous thrombosis) ([place]) 08/12/2020  ? COVID-19 virus infection 08/12/2020  ? Acute lower UTI 07/14/2020  ? Delirium 07/06/2020  ? Sleep apnea 07/06/2020  ? GERD (gastroesophageal reflux disease) 07/06/2020  ? 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[place]) 06/25/2020  ? Sundowning 06/25/2020  ? Slow transit constipation   ? Essential hypertension   ? Hypokalemia   ? Postoperative pain   ? Meningioma ([place]) 05/02/2020  ? Brain tumor ([place]) 04/28/2020  ? S/P nasal septoplasty 03/19/2020  ? Special screening for malignant neoplasms, colon 01/09/2018  ? ? ?REFERRING DIAG: D32.9 (ICD-10-CM) - Meningioma ([place]) G82.50 (ICD-10-CM) - 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[place]) R56.9 (ICD-10-CM) - Seizures ([place])   ? ?THERAPY DIAG:  ?Other abnormalities of gait and  mobility - Plan: PT plan of care cert/re-cert ? ?Unsteadiness on feet - Plan: PT plan of care cert/re-cert ? ?Muscle weakness (generalized) - Plan: PT plan of care cert/re-cert ? ?PERTINENT HISTORY: anxiety, OA, cancer, subdural hematoma  ?  ? ?PRECAUTIONS: Fall ? ?SUBJECTIVE: No new issues to report ? ?PAIN:  ?Are you having pain? Yes: NPRS scale: 3/10 ?Pain location: Low back ?Pain description: achy  ? ? ?OBJECTIVE:  ?  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 There are mild chronic small-vessel ischemic changes of the white matter. There is atrophy, encephalomalacia and gliosis along the medial aspects  of both sides of the brain at the frontoparietal vertex, sequela of the previous tumor and subsequent resection. It appears that the midportion of the superior sagittal ?sinus has been resected. No evidence of tumor in the venous sinuses anterior or posterior to that. ?    ?  ?TODAY'S TREATMENT:  ?  Ther Act  ?Assessed LTGs and wrote new goals for extended POC. Pt able to stand in [place] for 4:54 mod I, but fatigued quickly and unable to tolerate for 8 minutes.  ? ?PROM assessed in seated position:   ? ?PROM Right ?05/28/2021 Left ?05/28/2021  ?Knee extension -8 -17  ?Ankle dorsiflexion -12 -25  ? ?Lengthy discussion regarding LTG assessment and writing new pt-centered goals for extended POC. Encouraged pt to work on standing tolerance at home, as this will also improve LE ROM. Pt and wife verbalized understanding. Will practice sit <>stands in // bars w/wife next session to ensure it is safe for pt to perform at home. ? ? ?PATIENT EDUCATION: ?Education details: See above ?Person educated: Patient and Spouse ?Education method: Explanation, Demonstration, Tactile cues, and Verbal cues ?Education comprehension: verbalized understanding and needs further education ? ? ?  ?HOME EXERCISE PROGRAM: ?[email or web address] ?  ?ASSESSMENT: ?  ?CLINICAL IMPRESSION: ?Emphasis of skilled PT session on assessing LTGs and designing new goals for extended POC. Pt has met 1 of 7 LTGs, improving his bilateral DF and knee extension ROM >5 degrees. Pt able to tolerate standing in [place] at mod I level for 4:54, so will continue to progress goal of 8 minutes for improved ADLs and initiation of gait training. Pt continues to progress his bed mobility and transfer goals and will benefit from 8 more weeks of PT to improve strength, ROM and independence with transfers.  ? ?OBJECTIVE IMPAIRMENTS decreased activity tolerance, decreased balance, decreased coordination, decreased  endurance, decreased knowledge of use of DME, decreased mobility, difficulty walking, decreased ROM, decreased strength, hypomobility, impaired perceived functional ability, impaired flexibility, impaired UE functional use, and postural dysfunction.  ?  ?ACTIVITY LIMITATIONS cleaning, community activity, meal prep, laundry, and medication management.  ?  ?PERSONAL FACTORS Time since onset of injury/illness/exacerbation and 3+ comorbidities: anxiety, OA, cancer, subdural hematoma  are also affecting patient's functional outcome.  ?  ?  ?REHAB POTENTIAL: Fair due to amount of time since onset and previous progress with PT ?  ?CLINICAL DECISION MAKING: Evolving/moderate complexity ?  ?EVALUATION COMPLEXITY: Moderate ?  ?  ?GOALS: ?Goals reviewed with patient? Yes ?  ?  ?LONG TERM GOALS: Target date: 05/27/2021 ?  ?Pt/caregiver will be IND with final HEP in order to indicate improved functional mobility and dec fall risk. ?Baseline:  ?Goal status: Progressing continue ? ?2.  Pt will perform rolling R at S level and L at min/mod A level in order  to indicate improved functional mobility.  ?Baseline: able to roll R at S level with use of rail at home, required CGA/min A depending on fatigue level without rail ?Goal status: progressing conitnue ?  ?3.  Pt will perform SL>sit at min A level and sit>SL at S level in order to indicate improved functional mobility.   ?Baseline: L SL to sit requires mod to max A with bil LE and trunk control-05/05/21 ?Goal status: progressing continue ?  ?4.  Pt will perform slideboard transfers with mod A in order to indicate improved functional mobility.   ?Baseline: max A x2  ?Goal status: progressing ?  ?5.  Pt will perform sit<>stand in // bars at mod A level in order to indicate improved functional mobility.  ?Baseline: min A if not fatigued, max A w/fatigue ?Goal status: Progressing continue ?  ?6.  Pt will tolerate standing in [place] x 8-10 mins, with intermittent single UE support while  reaching outside of BOS at S level in order to indicate improved functional mobility.   ?Baseline: about 5 min 04/27/21; 4:54 on 05/28/21  ?Goal status:  Progressing  ? ? 7.  Pt will increase knee extension and

## 2021-05-28 NOTE — Therapy (Signed)
?OUTPATIENT OCCUPATIONAL THERAPY TREATMENT & RECERTIFICATION ? ? ?Patient Name: Anthony Downs ?MRN: [id number] ?DOB:January 30, 1950, 71 y.o., male ?Today's Date: 05/28/2021 ? ?PCP: Redmond School, MD ?REFERRING PROVIDER: Alger Simons MD ? ?END OF SESSION:  ? OT End of Session - 05/28/21 1557   ? ? Visit Number 17   ? Number of Visits 41   ? Date for OT Re-Evaluation 08/20/21   ? Authorization Type Medicare and UHC   ? Authorization Time Period UHC VL - Need medical auth after 30 visits   ? OT Start Time 5462   ? OT Stop Time 1645   ? OT Time Calculation (min) 40 min   ? Activity Tolerance Patient tolerated treatment well   ? Behavior During Therapy [facility] for tasks assessed/performed   ? ?  ?  ? ?  ? ? ? ? ?ONSET DATE: 04/28/20 ? ?REFERRING DIAG: Meningioma, Tetraplegia ? ?THERAPY DIAG:  ?Muscle weakness (generalized) ? ?Abnormal posture ? ?Stiffness of right shoulder, not elsewhere classified ? ?Other lack of coordination ? ?Stiffness of right elbow, not elsewhere classified ? ?Other disturbances of skin sensation ? ?Other symptoms and signs involving the musculoskeletal system ? ?Unsteadiness on feet ? ? ? ? ?Past Medical History:  ?Diagnosis Date  ? Acid reflux   ? Anxiety   ? Arthritis   ? Asthma   ? as a child  ? Basal cell carcinoma 01/26/2021  ? RIGHT POST SURICULAR INFERIOR  ? Basal cell carcinoma 01/26/2021  ? RIGHT POST AURICULAR SUPERIOR  ? Benign brain tumor ([place])   ? Cancer [facility])   ? 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, squamous behind ear  ? Complication of anesthesia   ? slow to wake up and pain medicines make him sick  ? GERD (gastroesophageal reflux disease)   ? Hypercholesteremia   ? Hypertension   ? PONV (postoperative nausea and vomiting)   ? Sleep apnea   ? no Cpap  ? 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 fell off truck hit head on concrete  ? ?Past Surgical History:  ?Procedure Laterality Date  ? APPLICATION OF CRANIAL NAVIGATION Left 04/29/2020  ? Procedure: APPLICATION OF CRANIAL  NAVIGATION;  Surgeon: Judith Part, MD;  Location: [place];  Service: Neurosurgery;  Laterality: Left;  ? arthroscopic knee    ? COLONOSCOPY N/A 11/29/2018  ? Procedure: COLONOSCOPY;  Surgeon: Rogene Houston, MD;  [facility];  Service: Endoscopy;  Laterality: N/A;  730-rescheduled 9/2 same time per Lelon Frohlich  ? CRANIOTOMY Left 04/29/2020  ? Procedure: LEFT CRANIECTOMY WITH TUMOR EXCISION;  Surgeon: Judith Part, MD;  Location: [place];  Service: Neurosurgery;  Laterality: Left;  ? KNEE SURGERY Right   ? NASAL SEPTOPLASTY W/ TURBINOPLASTY Bilateral 03/19/2020  ? Procedure: NASAL SEPTOPLASTY WITH BILATERAL  TURBINATE REDUCTION;  Surgeon: Leta Baptist, MD;  Location: [place];  Service: ENT;  Laterality: Bilateral;  ? VASECTOMY    ? ?Patient Active Problem List  ? Diagnosis Date Noted  ? Seizure ([place]) 08/13/2020  ? Seizures ([place]) 08/12/2020  ? 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 DVT (deep venous thrombosis) ([place]) 08/12/2020  ? COVID-19 virus infection 08/12/2020  ? Acute lower UTI 07/14/2020  ? Delirium 07/06/2020  ? Sleep apnea 07/06/2020  ? GERD (gastroesophageal reflux disease) 07/06/2020  ? 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[place]) 06/25/2020  ? Sundowning 06/25/2020  ? Slow transit constipation   ? Essential hypertension   ? Hypokalemia   ? Postoperative pain   ? Meningioma ([place]) 05/02/2020  ?  Brain tumor ([place]) 04/28/2020  ? S/P nasal septoplasty 03/19/2020  ? Special screening for malignant neoplasms, colon 01/09/2018  ? ? ?ONSET DATE: 04/28/20 ? ?REFERRING DIAG: Meningioma, Tetraplegia ? ?PERTINENT HISTORY: traumatic SDH 2008 ? ?PRECAUTIONS: Fall Risk ? ?SUBJECTIVE: "for me to walk, I need to make some more progress"  ? ?PAIN:  ?Are you having pain? No ? ? ? ?TODAY'S TREATMENT: ? ?05/28/21 ? ?A/ROM Right  ?Shoulder flexion 70 deg  ?Shoulder abduction 70 deg  ?  ?Recertification: This note serves for recertification and progress note for period 04/01/2021 - 05/28/2021. Pt has met 4/5 STGs and met 2/6 LTGs with progression  towards remaining ongoing goals. Pt continues to progress and demonstrating increased independence with ADLs and IADLs. Pt has progressed towards using BSC consistently vs bed level/bed pan toileting and is progressing with transfers without use of Stedy in the clinic. Pt also demonstrates increased functional use with RUE and is continuing with progress. Skilled occupational therapy is recommended to target listed areas of deficit and increase independence with ADLs and IADLs and decrease caregiver burden.   ? ? ?PATIENT EDUCATION: ?Education details: Education provided on progress towards goals and updates/revisions to met goals and progressing towards unmet goals, including added/new goals. ?Person educated: Patient and Spouse ?Education method: Explanation ?Education comprehension: verbalized understanding  ? ? ? ? ?GOALS: ?Goals reviewed with patient? Yes ?  ?SHORT TERM GOALS: Target date: 06/26/2021  ?  ?1.  Patient and caregiver will complete HEP designed to improve passive and active range of motion in BUE  ?Baseline:  ?Goal status: ONGOING ?  ?2.  Patient will utilize RUE to assist with at least one ADL task with mod cueing and minimal assistance  ?Baseline:  ?Goal status: ACHIEVED ?  ?3.  Patient will reach at least mid calf toward feet with BUE in preparation for participating in LB dressing  ?Baseline:  ?Goal status: ACHIEVED ?  ?4. Patient will demonstrate sufficient lateral lean left or right in seated position to prepare for placement of sliding board to encourage more active transfers and increase potential for commode transfers.  ?                        Baseline:  ?                        Goal status: ACHIEVED ? ?5. Pt will be educated on available AE and DME to assist with ADL completion as needed for improved independence.  ? Baseline: ? Goal status: ACHIEVED ? ?6. Patient will demonstrate improved coordination in RUE as evidenced by at least 15 blocks (box in blocks) in one minute  ? Baseline: 9  blocks at renewal 05/28/21 ? Goal status: INITIAL  ?  ?  ?LONG TERM GOALS: Target date: 08/20/2021 ?  ?1.  Patient will complete updated HEP designed to improve range, strength, and functional use of BUE  ?Baseline:  ?Goal status: ONGOING ?  ?2.  Patient will complete a level surface transfer with slide board PRN with no more than mod assistance in preparation for commode transfer  ?Baseline:  ?Goal status: REVISED/ONGOING currently inconsistent with SB transfers ?  ?3.  Patient will demonstrate improved coordination in RUE as evidenced by at least 8 blocks (box in blocks) in one minute  ?Baseline: 4 blocks ?Goal status: ACHIEVED 9 blocks ?  ?4.  Patient will demonstrate a 4lb increase in grip strength in RUE  ?  Baseline: 6.1  ?Goal status: ONGOING 7.9 lbs  ?  ?5. Patient will complete at least 30% more of bathing and dressing tasks  ? Baseline: ? Goal status: ACHIEVED  per pt wife-60-70% ? ?6. Pt will assist with clothing management and hygiene with unilateral support with min A with toileting. ? Baseline: ? Goal Status: INITIAL  ? ?7. Pt will increase range of motion in AROM in RUE shoulder flexion by at least 10 degrees for increasing ability to use right, dominant, hand, for more functional reaching tasks and ADLs. ? Baseline: 70 degrees ? Goal Status: INITIAL ? ? ? ? ? ? Plan   ? ? Clinical Impression Statement This note serves for recertification and progress note for period 04/01/2021 - 05/28/2021. Pt has met 4/5 STGs and met 2/6 LTGs with progression towards remaining ongoing goals. Pt continues to progress and demonstrating increased independence with ADLs and IADLs. Pt has progressed towards using BSC consistently vs bed level/bed pan toileting and is progressing with transfers without use of Stedy in the clinic. Pt also demonstrates increased functional use with RUE and is continuing with progress. Skilled occupational therapy is recommended to target listed areas of deficit and increase independence with ADLs  and IADLs and decrease caregiver burden.    ? OT Occupational Profile and History Detailed Assessment- Review of Records and additional review of physical, cognitive, psychosocial history related to current func

## 2021-05-29 ENCOUNTER — Other Ambulatory Visit (HOSPITAL_COMMUNITY): Payer: Self-pay | Admitting: Neurological Surgery

## 2021-05-29 DIAGNOSIS — D496 Neoplasm of unspecified behavior of brain: Secondary | ICD-10-CM

## 2021-06-02 ENCOUNTER — Ambulatory Visit: Payer: Medicare Other | Admitting: Occupational Therapy

## 2021-06-02 ENCOUNTER — Ambulatory Visit: Payer: Medicare Other

## 2021-06-02 DIAGNOSIS — R2689 Other abnormalities of gait and mobility: Secondary | ICD-10-CM

## 2021-06-02 DIAGNOSIS — R208 Other disturbances of skin sensation: Secondary | ICD-10-CM

## 2021-06-02 DIAGNOSIS — M25611 Stiffness of right shoulder, not elsewhere classified: Secondary | ICD-10-CM | POA: Diagnosis not present

## 2021-06-02 DIAGNOSIS — M25621 Stiffness of right elbow, not elsewhere classified: Secondary | ICD-10-CM

## 2021-06-02 DIAGNOSIS — R293 Abnormal posture: Secondary | ICD-10-CM

## 2021-06-02 DIAGNOSIS — R278 Other lack of coordination: Secondary | ICD-10-CM

## 2021-06-02 DIAGNOSIS — R29898 Other symptoms and signs involving the musculoskeletal system: Secondary | ICD-10-CM

## 2021-06-02 DIAGNOSIS — M6281 Muscle weakness (generalized): Secondary | ICD-10-CM

## 2021-06-02 DIAGNOSIS — R2681 Unsteadiness on feet: Secondary | ICD-10-CM

## 2021-06-02 NOTE — Therapy (Signed)
?OUTPATIENT OCCUPATIONAL THERAPY TREATMENT & RECERTIFICATION ? ? ?Patient Name: Anthony Downs ?MRN: [id number] ?DOB:1950-08-28, 71 y.o., male ?Today's Date: 06/02/2021 ? ?PCP: Redmond School, MD ?REFERRING PROVIDER: Alger Simons MD ? ?END OF SESSION:  ? OT End of Session - 06/02/21 1258   ? ? Visit Number 18   ? Number of Visits 41   ? Date for OT Re-Evaluation 08/20/21   ? Authorization Type Medicare and UHC   ? Authorization Time Period UHC VL - Need medical auth after 30 visits   ? OT Start Time 1318   ? OT Stop Time 1400   ? OT Time Calculation (min) 42 min   ? Activity Tolerance Patient tolerated treatment well   ? Behavior During Therapy [facility] for tasks assessed/performed   ? ?  ?  ? ?  ? ? ? ? ? ?ONSET DATE: 04/28/20 ? ?REFERRING DIAG: Meningioma, Tetraplegia ? ?THERAPY DIAG:  ?Muscle weakness (generalized) ? ?Abnormal posture ? ?Stiffness of right shoulder, not elsewhere classified ? ?Other lack of coordination ? ?Stiffness of right elbow, not elsewhere classified ? ?Other disturbances of skin sensation ? ?Other symptoms and signs involving the musculoskeletal system ? ?Other abnormalities of gait and mobility ? ?Unsteadiness on feet ? ? ? ? ?Past Medical History:  ?Diagnosis Date  ? Acid reflux   ? Anxiety   ? Arthritis   ? Asthma   ? as a child  ? Basal cell carcinoma 01/26/2021  ? RIGHT POST SURICULAR INFERIOR  ? Basal cell carcinoma 01/26/2021  ? RIGHT POST AURICULAR SUPERIOR  ? Benign brain tumor ([place])   ? Cancer [facility])   ? 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, squamous behind ear  ? Complication of anesthesia   ? slow to wake up and pain medicines make him sick  ? GERD (gastroesophageal reflux disease)   ? Hypercholesteremia   ? Hypertension   ? PONV (postoperative nausea and vomiting)   ? Sleep apnea   ? no Cpap  ? 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 fell off truck hit head on concrete  ? ?Past Surgical History:  ?Procedure Laterality Date  ? APPLICATION OF CRANIAL NAVIGATION Left  04/29/2020  ? Procedure: APPLICATION OF CRANIAL NAVIGATION;  Surgeon: Judith Part, MD;  Location: [place];  Service: Neurosurgery;  Laterality: Left;  ? arthroscopic knee    ? COLONOSCOPY N/A 11/29/2018  ? Procedure: COLONOSCOPY;  Surgeon: Rogene Houston, MD;  [facility];  Service: Endoscopy;  Laterality: N/A;  730-rescheduled 9/2 same time per Lelon Frohlich  ? CRANIOTOMY Left 04/29/2020  ? Procedure: LEFT CRANIECTOMY WITH TUMOR EXCISION;  Surgeon: Judith Part, MD;  Location: [place];  Service: Neurosurgery;  Laterality: Left;  ? KNEE SURGERY Right   ? NASAL SEPTOPLASTY W/ TURBINOPLASTY Bilateral 03/19/2020  ? Procedure: NASAL SEPTOPLASTY WITH BILATERAL  TURBINATE REDUCTION;  Surgeon: Leta Baptist, MD;  Location: [place];  Service: ENT;  Laterality: Bilateral;  ? VASECTOMY    ? ?Patient Active Problem List  ? Diagnosis Date Noted  ? Seizure ([place]) 08/13/2020  ? Seizures ([place]) 08/12/2020  ? 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 DVT (deep venous thrombosis) ([place]) 08/12/2020  ? COVID-19 virus infection 08/12/2020  ? Acute lower UTI 07/14/2020  ? Delirium 07/06/2020  ? Sleep apnea 07/06/2020  ? GERD (gastroesophageal reflux disease) 07/06/2020  ? 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[place]) 06/25/2020  ? Sundowning 06/25/2020  ? Slow transit constipation   ? Essential hypertension   ? Hypokalemia   ?  Postoperative pain   ? Meningioma ([name]) 05/02/2020  ? Brain tumor ([place]) 04/28/2020  ? S/P nasal septoplasty 03/19/2020  ? Special screening for malignant neoplasms, colon 01/09/2018  ? ? ?ONSET DATE: 04/28/20 ? ?REFERRING DIAG: Meningioma, Tetraplegia ? ?PERTINENT HISTORY: traumatic SDH 2008 ? ?PRECAUTIONS: Fall Risk ? ?SUBJECTIVE: "for me to walk, I need to make some more progress"  ? ?PAIN:  ?Are you having pain? No ? ? ? ?TODAY'S TREATMENT:sliding board transfer to elevated mat +2 a for safety, mod-max A, pt performing 40-50% to transfer to mat ,  ?Functional reaching activities  while unsupported on mat to grasp / release items low to  midrange  with RUE and place on target, min v.c and min difficulty ?Drinking with RUE, assisting with LUE, min v.c ?Sliding board transfer mat-w/c, max A+2 due to pt fatigue, increased time required. ? ? ?PATIENT EDUCATION: ?N/A ? ? ? ? ?GOALS: ?Goals reviewed with patient? Yes ?  ?SHORT TERM GOALS: Target date: 06/26/2021  ?  ?1.  Patient and caregiver will complete HEP designed to improve passive and active range of motion in BUE  ?Baseline:  ?Goal status: ONGOING ?  ?2.  Patient will utilize RUE to assist with at least one ADL task with mod cueing and minimal assistance  ?Baseline:  ?Goal status: ACHIEVED ?  ?3.  Patient will reach at least mid calf toward feet with BUE in preparation for participating in LB dressing  ?Baseline:  ?Goal status: ACHIEVED ?  ?4. Patient will demonstrate sufficient lateral lean left or right in seated position to prepare for placement of sliding board to encourage more active transfers and increase potential for commode transfers.  ?                        Baseline:  ?                        Goal status: ACHIEVED ? ?5. Pt will be educated on available AE and DME to assist with ADL completion as needed for improved independence.  ? Baseline: ? Goal status: ACHIEVED ? ?6. Patient will demonstrate improved coordination in RUE as evidenced by at least 15 blocks (box in blocks) in one minute  ? Baseline: 9 blocks at renewal 05/28/21 ? Goal status: INITIAL  ?  ?  ?LONG TERM GOALS: Target date: 08/20/2021 ?  ?1.  Patient will complete updated HEP designed to improve range, strength, and functional use of BUE  ?Baseline:  ?Goal status: ONGOING ?  ?2.  Patient will complete a level surface transfer with slide board PRN with no more than mod assistance in preparation for commode transfer  ?Baseline:  ?Goal status: REVISED/ONGOING currently inconsistent with SB transfers ?  ?3.  Patient will demonstrate improved coordination in RUE as evidenced by at least 8 blocks (box in blocks) in one minute   ?Baseline: 4 blocks ?Goal status: ACHIEVED 9 blocks ?  ?4.  Patient will demonstrate a 4lb increase in grip strength in RUE  ?Baseline: 6.1  ?Goal status: ONGOING 7.9 lbs  ?  ?5. Patient will complete at least 30% more of bathing and dressing tasks  ? Baseline: ? Goal status: ACHIEVED  per pt wife-60-70% ? ?6. Pt will assist with clothing management and hygiene with unilateral support with min A with toileting. ? Baseline: ? Goal Status: INITIAL  ? ?7. Pt will increase range of motion in AROM in RUE shoulder flexion  by at least 10 degrees for increasing ability to use right, dominant, hand, for more functional reaching tasks and ADLs. ? Baseline: 70 degrees ? Goal Status: INITIAL ? ? ? ? ? ? Plan   ? ? Clinical Impression Statement Pt is progressing towards goals. He demonstrates improving functional reach with RUE.  ? OT Occupational Profile and History Detailed Assessment- Review of Records and additional review of physical, cognitive, psychosocial history related to current functional performance   ? Occupational performance deficits (Please refer to evaluation for details): ADL's;IADL's;Leisure   ? Body Structure / Function / Physical Skills ADL;Decreased knowledge of use of DME;Gait;Obesity;Strength;Tone;GMC;Dexterity;Balance;Body mechanics;Edema;Proprioception;UE functional use;Cardiopulmonary status limiting activity;Endurance;IADL;ROM;Continence;Coordination;Flexibility;Mobility;Sensation;FMC;Decreased knowledge of precautions;Muscle spasms   ? Rehab Potential Good   ? Clinical Decision Making Several treatment options, min-mod task modification necessary   ? Comorbidities Affecting Occupational Performance: May have comorbidities impacting occupational performance   ? Modification or Assistance to Complete Evaluation  Min-Moderate modification of tasks or assist with assess necessary to complete eval   ? OT Frequency 2x / week   ? OT Duration 12 weeks   ? OT Treatment/Interventions Self-care/ADL  training;Aquatic Therapy;DME and/or AE instruction;Splinting;Balance training;Therapeutic activities;Therapeutic exercise;Cognitive remediation/compensation;Passive range of motion;Functional Mobility Training;Neuromuscular

## 2021-06-03 ENCOUNTER — Encounter: Payer: Self-pay | Admitting: Neurology

## 2021-06-03 NOTE — Therapy (Incomplete)
?OUTPATIENT OCCUPATIONAL THERAPY TREATMENT & RECERTIFICATION ? ? ?Patient Name: Anthony Downs ?MRN: [id number] ?DOB:16-Sep-1950, 71 y.o., male ?Today's Date: 06/03/2021 ? ?PCP: Redmond School, MD ?REFERRING PROVIDER: Alger Simons MD ? ?END OF SESSION:  ? ? ? ? ? ? ?ONSET DATE: 04/28/20 ? ?REFERRING DIAG: Meningioma, Tetraplegia ? ?THERAPY DIAG:  ?No diagnosis found. ? ? ? ? ?Past Medical History:  ?Diagnosis Date  ? Acid reflux   ? Anxiety   ? Arthritis   ? Asthma   ? as a child  ? Basal cell carcinoma 01/26/2021  ? RIGHT POST SURICULAR INFERIOR  ? Basal cell carcinoma 01/26/2021  ? RIGHT POST AURICULAR SUPERIOR  ? Benign brain tumor ([place])   ? Cancer [facility])   ? 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, squamous behind ear  ? Complication of anesthesia   ? slow to wake up and pain medicines make him sick  ? GERD (gastroesophageal reflux disease)   ? Hypercholesteremia   ? Hypertension   ? PONV (postoperative nausea and vomiting)   ? Sleep apnea   ? no Cpap  ? 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 fell off truck hit head on concrete  ? ?Past Surgical History:  ?Procedure Laterality Date  ? APPLICATION OF CRANIAL NAVIGATION Left 04/29/2020  ? Procedure: APPLICATION OF CRANIAL NAVIGATION;  Surgeon: Judith Part, MD;  Location: [place];  Service: Neurosurgery;  Laterality: Left;  ? arthroscopic knee    ? COLONOSCOPY N/A 11/29/2018  ? Procedure: COLONOSCOPY;  Surgeon: Rogene Houston, MD;  [facility];  Service: Endoscopy;  Laterality: N/A;  730-rescheduled 9/2 same time per Lelon Frohlich  ? CRANIOTOMY Left 04/29/2020  ? Procedure: LEFT CRANIECTOMY WITH TUMOR EXCISION;  Surgeon: Judith Part, MD;  Location: [place];  Service: Neurosurgery;  Laterality: Left;  ? KNEE SURGERY Right   ? NASAL SEPTOPLASTY W/ TURBINOPLASTY Bilateral 03/19/2020  ? Procedure: NASAL SEPTOPLASTY WITH BILATERAL  TURBINATE REDUCTION;  Surgeon: Leta Baptist, MD;  Location: [place];  Service: ENT;  Laterality: Bilateral;  ? VASECTOMY     ? ?Patient Active Problem List  ? Diagnosis Date Noted  ? Seizure ([place]) 08/13/2020  ? Seizures ([place]) 08/12/2020  ? 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 DVT (deep venous thrombosis) ([place]) 08/12/2020  ? COVID-19 virus infection 08/12/2020  ? Acute lower UTI 07/14/2020  ? Delirium 07/06/2020  ? Sleep apnea 07/06/2020  ? GERD (gastroesophageal reflux disease) 07/06/2020  ? 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[place]) 06/25/2020  ? Sundowning 06/25/2020  ? Slow transit constipation   ? Essential hypertension   ? Hypokalemia   ? Postoperative pain   ? Meningioma ([place]) 05/02/2020  ? Brain tumor ([place]) 04/28/2020  ? S/P nasal septoplasty 03/19/2020  ? Special screening for malignant neoplasms, colon 01/09/2018  ? ? ?ONSET DATE: 04/28/20 ? ?REFERRING DIAG: Meningioma, Tetraplegia ? ?PERTINENT HISTORY: traumatic SDH 2008 ? ?PRECAUTIONS: Fall Risk ? ?SUBJECTIVE: "for me to walk, I need to make some more progress"  ? ?PAIN:  ?Are you having pain? No ? ? ? ?TODAY'S TREATMENT:sliding board transfer to elevated mat +2 a for safety, mod-max A, pt performing 40-50% to transfer to mat ,  ?Functional reaching activities  while unsupported on mat to grasp / release items low to midrange  with RUE and place on target, min v.c and min difficulty ?Drinking with RUE, assisting with LUE, min v.c ?Sliding board transfer mat-w/c, max A+2 due to pt fatigue, increased time required. ? ? ?PATIENT EDUCATION: ?N/A ? ? ? ? ?  GOALS: ?Goals reviewed with patient? Yes ?  ?SHORT TERM GOALS: Target date: 06/26/2021  ?  ?1.  Patient and caregiver will complete HEP designed to improve passive and active range of motion in BUE  ?Baseline:  ?Goal status: ONGOING ?  ?2.  Patient will utilize RUE to assist with at least one ADL task with mod cueing and minimal assistance  ?Baseline:  ?Goal status: ACHIEVED ?  ?3.  Patient will reach at least mid calf toward feet with BUE in preparation for participating in LB dressing  ?Baseline:  ?Goal status: ACHIEVED ?  ?4. Patient will demonstrate  sufficient lateral lean left or right in seated position to prepare for placement of sliding board to encourage more active transfers and increase potential for commode transfers.  ?                        Baseline:  ?                        Goal status: ACHIEVED ? ?5. Pt will be educated on available AE and DME to assist with ADL completion as needed for improved independence.  ? Baseline: ? Goal status: ACHIEVED ? ?6. Patient will demonstrate improved coordination in RUE as evidenced by at least 15 blocks (box in blocks) in one minute  ? Baseline: 9 blocks at renewal 05/28/21 ? Goal status: INITIAL  ?  ?  ?LONG TERM GOALS: Target date: 08/20/2021 ?  ?1.  Patient will complete updated HEP designed to improve range, strength, and functional use of BUE  ?Baseline:  ?Goal status: ONGOING ?  ?2.  Patient will complete a level surface transfer with slide board PRN with no more than mod assistance in preparation for commode transfer  ?Baseline:  ?Goal status: REVISED/ONGOING currently inconsistent with SB transfers ?  ?3.  Patient will demonstrate improved coordination in RUE as evidenced by at least 8 blocks (box in blocks) in one minute  ?Baseline: 4 blocks ?Goal status: ACHIEVED 9 blocks ?  ?4.  Patient will demonstrate a 4lb increase in grip strength in RUE  ?Baseline: 6.1  ?Goal status: ONGOING 7.9 lbs  ?  ?5. Patient will complete at least 30% more of bathing and dressing tasks  ? Baseline: ? Goal status: ACHIEVED  per pt wife-60-70% ? ?6. Pt will assist with clothing management and hygiene with unilateral support with min A with toileting. ? Baseline: ? Goal Status: INITIAL  ? ?7. Pt will increase range of motion in AROM in RUE shoulder flexion by at least 10 degrees for increasing ability to use right, dominant, hand, for more functional reaching tasks and ADLs. ? Baseline: 70 degrees ? Goal Status: INITIAL ? ? ? ? ? ? Plan   ? ? Clinical Impression Statement Pt is progressing towards goals. He demonstrates  improving functional reach with RUE.  ? OT Occupational Profile and History Detailed Assessment- Review of Records and additional review of physical, cognitive, psychosocial history related to current functional performance   ? Occupational performance deficits (Please refer to evaluation for details): ADL's;IADL's;Leisure   ? Body Structure / Function / Physical Skills ADL;Decreased knowledge of use of DME;Gait;Obesity;Strength;Tone;GMC;Dexterity;Balance;Body mechanics;Edema;Proprioception;UE functional use;Cardiopulmonary status limiting activity;Endurance;IADL;ROM;Continence;Coordination;Flexibility;Mobility;Sensation;FMC;Decreased knowledge of precautions;Muscle spasms   ? Rehab Potential Good   ? Clinical Decision Making Several treatment options, min-mod task modification necessary   ? Comorbidities Affecting Occupational Performance: May have comorbidities impacting occupational performance   ? Modification or  Assistance to Complete Evaluation  Min-Moderate modification of tasks or assist with assess necessary to complete eval   ? OT Frequency 2x / week   ? OT Duration 12 weeks   ? OT Treatment/Interventions Self-care/ADL training;Aquatic Therapy;DME and/or AE instruction;Splinting;Balance training;Therapeutic activities;Therapeutic exercise;Cognitive remediation/compensation;Passive range of motion;Functional Mobility Training;Neuromuscular education;Electrical Stimulation;Energy conservation;Patient/family education;Manual Therapy   ? Plan Functional use of RUE, continue towards increased mobility/transfers for increased independence and ability to complete ADLs, establish a good HEP for RUE stretching and functional use  ? Consulted and Agree with Plan of Care Patient;Family member/caregiver   ? Family Member Consulted wife  ? ?  ?  ? ?  ? ? ? ?Ivar Domangue, OT ?06/03/2021, 1:11 PM ? ? ?

## 2021-06-04 ENCOUNTER — Encounter: Payer: Self-pay | Admitting: Occupational Therapy

## 2021-06-04 ENCOUNTER — Ambulatory Visit: Payer: Medicare Other | Admitting: Physical Therapy

## 2021-06-04 ENCOUNTER — Ambulatory Visit: Payer: Medicare Other | Admitting: Occupational Therapy

## 2021-06-04 DIAGNOSIS — R208 Other disturbances of skin sensation: Secondary | ICD-10-CM

## 2021-06-04 DIAGNOSIS — M25611 Stiffness of right shoulder, not elsewhere classified: Secondary | ICD-10-CM

## 2021-06-04 DIAGNOSIS — M6281 Muscle weakness (generalized): Secondary | ICD-10-CM

## 2021-06-04 DIAGNOSIS — R293 Abnormal posture: Secondary | ICD-10-CM

## 2021-06-04 DIAGNOSIS — M25621 Stiffness of right elbow, not elsewhere classified: Secondary | ICD-10-CM

## 2021-06-04 DIAGNOSIS — R2689 Other abnormalities of gait and mobility: Secondary | ICD-10-CM

## 2021-06-04 DIAGNOSIS — R278 Other lack of coordination: Secondary | ICD-10-CM
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
## 2021-06-07 ENCOUNTER — Ambulatory Visit (HOSPITAL_COMMUNITY)
Admission: RE | Admit: 2021-06-07 | Discharge: 2021-06-07 | Disposition: A | Discharge status: Home / Self Care | Payer: Medicare Other | Source: Ambulatory Visit | Attending: Neurological Surgery | Admitting: Neurological Surgery

## 2021-06-07 DIAGNOSIS — D496 Neoplasm of unspecified behavior of brain: Secondary | ICD-10-CM | POA: Insufficient documentation

## 2021-06-07 MED ORDER — GADOBUTROL 1 MMOL/ML IV SOLN
10.0000 mL | Freq: Once | INTRAVENOUS | Status: AC | PRN
  Administered 2021-06-07: 10 mL via INTRAVENOUS

## 2021-06-08 ENCOUNTER — Ambulatory Visit: Payer: Medicare Other

## 2021-06-08 ENCOUNTER — Ambulatory Visit: Payer: Medicare Other | Admitting: Physical Therapy

## 2021-06-08 DIAGNOSIS — R2689 Other abnormalities of gait and mobility: Secondary | ICD-10-CM

## 2021-06-08 DIAGNOSIS — R2681 Unsteadiness on feet: Secondary | ICD-10-CM

## 2021-06-08 DIAGNOSIS — M6281 Muscle weakness (generalized): Secondary | ICD-10-CM

## 2021-06-08 DIAGNOSIS — M25611 Stiffness of right shoulder, not elsewhere classified: Secondary | ICD-10-CM | POA: Diagnosis not present

## 2021-06-08 NOTE — Therapy (Signed)
OUTPATIENT PHYSICAL THERAPY TREATMENT NOTE  Patient Name: Anthony Downs MRN: [id number] DOB:05-17-1950, 71 y.o., male Today's Date: 06/08/2021  PCP: Redmond School, MD REFERRING PROVIDER: [name], MD    PT End of Session - 06/08/21 1451     Visit Number 18    Number of Visits 33   plus eval   Date for PT Re-Evaluation 40/98/11   Recert   Authorization Type Medicare and [employer] (needs 10th visit progress note)    Progress Note Due on Visit 20    PT Start Time 1450    PT Stop Time 1530    PT Time Calculation (min) 40 min    Equipment Utilized During Treatment Gait belt    Activity Tolerance Patient tolerated treatment well    Behavior During Therapy WFL for tasks assessed/performed                    Past Medical History:  Diagnosis Date   Acid reflux    Anxiety    Arthritis    Asthma    as a child   Basal cell carcinoma 01/26/2021   RIGHT POST SURICULAR INFERIOR   Basal cell carcinoma 01/26/2021   RIGHT POST AURICULAR SUPERIOR   Benign brain tumor ([place])    Cancer ([place])    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, squamous behind ear   Complication of anesthesia    slow to wake up and pain medicines make him sick   GERD (gastroesophageal reflux disease)    Hypercholesteremia    Hypertension    PONV (postoperative nausea and vomiting)    Sleep apnea    no Cpap   Subdural hematoma, 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   fell off truck hit head on concrete   Past Surgical History:  Procedure Laterality Date   APPLICATION OF CRANIAL NAVIGATION Left 04/29/2020   Procedure: [place];  Surgeon: Judith Part, MD;  Location: [facility];  Service: Neurosurgery;  Laterality: Left;   arthroscopic knee     COLONOSCOPY N/A 11/29/2018   Procedure: COLONOSCOPY;  Surgeon: Rogene Houston, MD;  [facility];  Service: Endoscopy;  Laterality: N/A;  730-rescheduled 9/2 same time per Ann   CRANIOTOMY Left 04/29/2020   Procedure: LEFT  CRANIECTOMY WITH TUMOR EXCISION;  Surgeon: Judith Part, MD;  Location: [facility];  Service: Neurosurgery;  Laterality: Left;   KNEE SURGERY Right    NASAL SEPTOPLASTY W/ TURBINOPLASTY Bilateral 03/19/2020   Procedure: NASAL SEPTOPLASTY WITH BILATERAL  TURBINATE REDUCTION;  Surgeon: Leta Baptist, MD;  Location: [facility];  Service: ENT;  Laterality: Bilateral;   VASECTOMY     Patient Active Problem List   Diagnosis Date Noted   Seizure ([place]) 08/13/2020   Seizures ([place]) 08/12/2020   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50 (ICD-10-CM) - 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[place]) R56.9 (ICD-10-CM) - Seizures ([place])    THERAPY DIAG:  Muscle  weakness (generalized)  Unsteadiness on feet  Other abnormalities of gait and mobility  PERTINENT HISTORY: anxiety, OA, cancer, subdural hematoma     PRECAUTIONS: Fall  SUBJECTIVE: Pt's wife showed video of pt flexing RLE off bed in supine position, first time he has been able to do so thus far. No new changes   PAIN:  Are you having pain? 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 There are mild chronic small-vessel ischemic changes of the white matter. There is atrophy, encephalomalacia and gliosis along the medial aspects of both sides of the brain at the frontoparietal vertex,  sequela of the previous tumor and subsequent resection. It appears that the midportion of the superior sagittal sinus has been resected. No evidence of tumor in the venous sinuses anterior or posterior to that.  From MRI on 5/21: Redemonstrated sequela of prior vertex craniotomy/cranioplasty and parafalcine meningioma resection with partial resection of the superior sagittal sinus. No evidence of residual/recurrent tumor at the resection site.   Chronic intracranial findings without interval change, as described.   Paranasal sinus disease, as outlined.       TODAY'S TREATMENT:    NMR In // bars for improved transfers and BLE strength:  -Sit <>stand w/min A for glute extension assistance. Pt held stand for 5 minutes w/BUE support on rails and noted ability to maintain upright posture and bilat knee extension w/min tactile cues, no knee block required on L side.  -Sit <>stand x2 w/CGA and BUE support, held for 2:53 and 2:30. Pt able to initiate lateral weight shift and lift bilat heel off ground for 90 sec during each stand.  -Mass sit <>stand practice for improved endurance and global strength. Pt able to perform 4 reps w/CGA in one minute. Noted good anterior shift throughout and bilat knee/hip extension until fatigue on last repetition. Mod verbal cues to breathe throughout as pt tends to valsalva w/exertion.     PATIENT EDUCATION: Education details: MRI results from 5/21, continuing standing at home  Person educated: Patient and Spouse Education method: Explanation, Demonstration, Tactile cues, and Verbal cues Education comprehension: verbalized understanding and needs further education     HOME EXERCISE PROGRAM: [email or web address]   ASSESSMENT:   CLINICAL IMPRESSION: Emphasis of skilled PT session on sit <>stands, standing tolerance and lateral weightshifting. Pt able to perform sit <>stands in // bars w/CGA  today for the first time. Pt also able to initiate lateral weight shift without need for tactile cues. Pt continues to be limited by ROM restriction in BLEs, BLE weakness and decreased cardiovascular endurance. Continue POC.   OBJECTIVE IMPAIRMENTS decreased activity tolerance, decreased balance, decreased coordination, decreased endurance, decreased knowledge of use of DME, decreased mobility, difficulty walking, decreased ROM, decreased strength, hypomobility, impaired perceived functional ability, impaired flexibility, impaired UE functional use, and postural dysfunction.    ACTIVITY LIMITATIONS cleaning, community activity, meal prep, laundry, and medication management.    PERSONAL FACTORS Time since onset of injury/illness/exacerbation and 3+ comorbidities: anxiety, OA, cancer, subdural hematoma  are also affecting patient's functional outcome.      REHAB POTENTIAL: Fair due to amount of time since onset and previous progress with PT   CLINICAL DECISION MAKING: Evolving/moderate complexity   EVALUATION COMPLEXITY: Moderate     GOALS: Goals reviewed with patient? Yes     OLD LONG TERM GOALS: Target date: 05/27/2021   Pt/caregiver will be IND  with final HEP in order to indicate improved functional mobility and dec fall risk. Baseline:  Goal status: Progressing continue  2.  Pt will perform rolling R at S level and L at min/mod A level in order to indicate improved functional mobility.  Baseline: able to roll R at S level with use of rail at home, required CGA/min A depending on fatigue level without rail Goal status: progressing conitnue   3.  Pt will perform SL>sit at min A level and sit>SL at S level in order to indicate improved functional mobility.   Baseline: L SL to sit requires mod to max A with bil LE and trunk control-05/05/21 Goal status: progressing continue   4.  Pt will perform slideboard transfers with mod A in order to indicate improved functional mobility.    Baseline: max A x2  Goal status: progressing   5.  Pt will perform sit<>stand in // bars at mod A level in order to indicate improved functional mobility.  Baseline: min A if not fatigued, max A w/fatigue Goal status: Progressing continue   6.  Pt will tolerate standing in stedy x 8-10 mins, with intermittent single UE support while reaching outside of BOS at S level in order to indicate improved functional mobility.   Baseline: about 5 min 04/27/21; 4:54 on 05/28/21  Goal status:  Progressing    7.  Pt will increase knee extension and ankle DF by 5 degrees for improved mobility with tasks. Baseline: PROM: Right ankle DF=-27, Left ankle DF=-32, Right knee ext=-14, Left knee ext=-25; On 5/11:  R knee ext = -8 R ankle DF = -12 L knee ext = -17 L ankle DF = -25 Goal status: MET   NEW SHORT TERM GOALS FOR UPDATED POC:   Target date: 06/25/2021  Pt will perform rolling R at S level and L at min level in order to indicate improved functional mobility.  Baseline:  able to roll R at S level with use of rail at home, required CGA/min A depending on fatigue level without rail Goal status: IN PROGRESS  2.  Pt will perform SL>sit at min A level and sit>SL at S level in order to indicate improved functional mobility.   Baseline: L SL to sit requires mod to max A with bil LE and trunk control-05/05/21 Goal status: INITIAL  3.  Pt will perform slideboard transfers with mod A consistently in order to indicate improved functional mobility.   Baseline: min A-max A Goal status: INITIAL  4.  Pt will tolerate standing in stedy x 8 mins, with intermittent single UE support while reaching outside of BOS at mod I level in order to indicate improved functional mobility.   Baseline: 4:54s on 05/28/21 Goal status: INITIAL   NEW LONG TERM GOALS FOR UPDATED POC:  Target date: 07/23/2021  Pt/caregiver will be IND with final HEP in order to indicate improved functional mobility and dec fall risk. Baseline:   Goal status: IN PROGRESS  2.  Pt will tolerate standing in // bars x 10 mins, with intermittent single UE support while reaching outside of BOS at mod I level in order to indicate improved functional mobility.   Baseline:  Goal status: INITIAL  3.  Pt will improve bilat ankle DF by 5 degrees and bilat knee extension by 6 degrees for improved body mechanics in standing and functional mobility Baseline: On 5/11: R knee ext = -8, R ankle DF= -12, L knee ext = -17, L ankle DF = -  25 Goal status: INITIAL  4.  Pt will perform sit<>stand in // bars at [facility] level in order to indicate improved functional mobility.  Baseline: min-max A 2/2 fatigue Goal status: INITIAL  5.  Pt will perform lateral scoot transfers with or without slide board w/min A for improved independence with transfers Baseline: min-max A w/slide board  Goal status: INITIAL  PLAN: PT FREQUENCY: 2x/week   PT DURATION: 16 weeks (recert)   PLANNED INTERVENTIONS: Therapeutic exercises, Therapeutic activity, Neuromuscular re-education, Balance training, Gait training, Patient/Family education, Joint mobilization, Vestibular training, Orthotic/Fit training, DME instructions, and Manual therapy   PLAN FOR NEXT SESSION:  Practice sit <>stand in // bars with wife. Lateral weight shifting in standing, rolling using PNF principles. Lateral scooting on mat, sit <>supine transfers, add strengthening to HEP as able. In therapy, use stedy for weight shifting, reaching,  trunk dissociation, sit <>stands in // bars.  He did so well seated in w/c facing mat, placing elbows on mat and lifting buttocks>coming to stand>weight shifting.  If we ever have +2A, I would love to try and get him prone.  He did not have a great experience with it in the past, but UEs were not positioned well. Sit > supine >sidelying and rolling practice, use of leg lifter.    Cruzita Lederer Christien Berthelot, PT, DPT 06/08/21, 3:38 PM

## 2021-06-09 ENCOUNTER — Ambulatory Visit: Payer: Medicare Other

## 2021-06-09 ENCOUNTER — Encounter: Payer: Medicare Other | Admitting: Occupational Therapy

## 2021-06-11 ENCOUNTER — Ambulatory Visit: Payer: Medicare Other

## 2021-06-11 ENCOUNTER — Encounter: Payer: Medicare Other | Admitting: Occupational Therapy

## 2021-06-17 ENCOUNTER — Ambulatory Visit: Payer: Medicare Other | Admitting: Physical Therapy

## 2021-06-17 ENCOUNTER — Encounter: Payer: Self-pay | Admitting: Occupational Therapy

## 2021-06-17 ENCOUNTER — Ambulatory Visit: Payer: Medicare Other | Admitting: Occupational Therapy

## 2021-06-17 DIAGNOSIS — R2689 Other abnormalities of gait and mobility: Secondary | ICD-10-CM

## 2021-06-17 DIAGNOSIS — R29898 Other symptoms and signs involving the musculoskeletal system: Secondary | ICD-10-CM

## 2021-06-17 DIAGNOSIS — M25621 Stiffness of right elbow, not elsewhere classified: Secondary | ICD-10-CM

## 2021-06-17 DIAGNOSIS — R2681 Unsteadiness on feet: Secondary | ICD-10-CM

## 2021-06-17 DIAGNOSIS — R278 Other lack of coordination: Secondary | ICD-10-CM

## 2021-06-17 DIAGNOSIS — R208 Other disturbances of skin sensation: Secondary | ICD-10-CM

## 2021-06-17 DIAGNOSIS — R293 Abnormal posture: Secondary | ICD-10-CM

## 2021-06-17 DIAGNOSIS — M25611 Stiffness of right shoulder, not elsewhere classified: Secondary | ICD-10-CM

## 2021-06-17 DIAGNOSIS — M6281 Muscle weakness (generalized): Secondary | ICD-10-CM

## 2021-06-17 NOTE — Therapy (Signed)
OUTPATIENT OCCUPATIONAL THERAPY TREATMENT AND PROGRESS NOTE   Patient Name: Anthony Downs MRN: [id number] DOB:22-Jul-1950, 71 y.o., male Today's Date: 06/17/2021  PCP: Redmond School, MD REFERRING PROVIDER: Alger Simons MD  END OF SESSION:   OT End of Session - 06/17/21 1646     Visit Number 20    Number of Visits 41    Date for OT Re-Evaluation 08/20/21    Authorization Type Medicare and UHC    Authorization Time Period UHC VL - Need medical auth after 30 visits    OT Start Time 3710    OT Stop Time 1230    OT Time Calculation (min) 45 min    Activity Tolerance Patient tolerated treatment well    Behavior During Therapy WFL for tasks assessed/performed                ONSET DATE: 04/28/20  REFERRING DIAG: Meningioma, Tetraplegia  THERAPY DIAG:  Abnormal posture  Unsteadiness on feet  Stiffness of right shoulder, not elsewhere classified  Other lack of coordination  Stiffness of right elbow, not elsewhere classified  Muscle weakness (generalized)  Other disturbances of skin sensation  Other symptoms and signs involving the musculoskeletal system     Past Medical History:  Diagnosis Date   Acid reflux    Anxiety    Arthritis    Asthma    as a child   Basal cell carcinoma 01/26/2021   RIGHT POST SURICULAR INFERIOR   Basal cell carcinoma 01/26/2021   RIGHT POST AURICULAR SUPERIOR   Benign brain tumor ([place])    Cancer ([place])    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, squamous behind ear   Complication of anesthesia    slow to wake up and pain medicines make him sick   GERD (gastroesophageal reflux disease)    Hypercholesteremia    Hypertension    PONV (postoperative nausea and vomiting)    Sleep apnea    no Cpap   Subdural hematoma, 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   fell off truck hit head on concrete   Past Surgical History:  Procedure Laterality Date   APPLICATION OF CRANIAL NAVIGATION Left 04/29/2020   Procedure: [place];  Surgeon: Judith Part, MD;  Location: [facility];  Service: Neurosurgery;  Laterality: Left;   arthroscopic knee     COLONOSCOPY N/A 11/29/2018   Procedure: COLONOSCOPY;  Surgeon: Rogene Houston, MD;  [facility];  Service: Endoscopy;  Laterality: N/A;  730-rescheduled 9/2 same time per Ann   CRANIOTOMY Left 04/29/2020   Procedure: LEFT CRANIECTOMY WITH TUMOR EXCISION;  Surgeon: Judith Part, MD;  Location: [facility];  Service: Neurosurgery;  Laterality: Left;   KNEE SURGERY Right    NASAL SEPTOPLASTY W/ TURBINOPLASTY Bilateral 03/19/2020   Procedure: NASAL SEPTOPLASTY WITH BILATERAL  TURBINATE REDUCTION;  Surgeon: Leta Baptist, MD;  Location: [facility];  Service: ENT;  Laterality: Bilateral;   VASECTOMY     Patient Active Problem List   Diagnosis Date Noted   Seizure ([place]) 08/13/2020   Seizures ([place]) 08/12/2020   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   DVT (deep venous thrombosis) ([place]) 08/12/2020   COVID-19 virus infection 08/12/2020   Acute lower UTI 07/14/2020   Delirium 07/06/2020   Sleep apnea 07/06/2020   GERD (gastroesophageal reflux disease) 07/06/2020   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[place]) 06/25/2020   Sundowning 06/25/2020   Slow transit constipation    Essential hypertension    Hypokalemia    Postoperative pain    Meningioma (  [place]) 05/02/2020   Brain tumor ([place]) 04/28/2020   S/P nasal septoplasty 03/19/2020   Special screening for malignant neoplasms, colon 01/09/2018    ONSET DATE: 04/28/20  REFERRING DIAG: Meningioma, Tetraplegia  PERTINENT HISTORY: traumatic SDH 2008  PRECAUTIONS: Fall Risk  SUBJECTIVE: Pt denies any pain today  PAIN:  Are you having pain? No    TODAY'S TREATMENT:  06/17/21 Patient already on mat following PT session.  Patient with large dressing on right side of head behind his ear s/p skin cancer removal last week.   Patient reports using right hand to feed self sandwich using left hand as support to reduce tremor.  Encouraged  patient to attempt utensil in right hand for 2-3 bites with sticky food - potatoes, oatmeal.   Worked on RUE active reaching patterns with weight shifting toward right side.  Patient with very tight right hip, so worked to stabilize right hip and have body weight shift over.   Worked on forward translation of weight to allow patient to complete squat pivot transfer to right with set up assistance initially from elevated surface.     06/04/21  Pt transferred from power chair to edge of mat with mod A x 1 person without SB to left and increased time and max cues. Pt transitioned to supine with max A for leg management.  Supine HEP with unweighted dowel x 10 reps with shoulder flexion, chest press, horizontal abduction       GOALS: Goals reviewed with patient? Yes   SHORT TERM GOALS: Target date: 06/26/2021    1.  Patient and caregiver will complete HEP designed to improve passive and active range of motion in BUE  Baseline:  Goal status: ACHIEVED   2.  Patient will utilize RUE to assist with at least one ADL task with mod cueing and minimal assistance  Baseline:  Goal status: ACHIEVED   3.  Patient will reach at least mid calf toward feet with BUE in preparation for participating in LB dressing  Baseline:  Goal status: ACHIEVED   4. Patient will demonstrate sufficient lateral lean left or right in seated position to prepare for placement of sliding board to encourage more active transfers and increase potential for commode transfers.                          Baseline:                          Goal status: ACHIEVED  5. Pt will be educated on available AE and DME to assist with ADL completion as needed for improved independence.   Baseline:  Goal status: ACHIEVED  6. Patient will demonstrate improved coordination in RUE as evidenced by at least 15 blocks (box in blocks) in one minute   Baseline: 9 blocks at renewal 05/28/21  Goal status: INITIAL - CONTINUE GOAL      LONG TERM  GOALS: Target date: 08/20/2021   1.  Patient will complete updated HEP designed to improve range, strength, and functional use of BUE  Baseline:  Goal status: ONGOING   2.  Patient will complete a level surface transfer with slide board PRN with no more than mod assistance in preparation for commode transfer  Baseline:  Goal status: REVISED/ONGOING currently inconsistent with SB transfers   3.  Patient will demonstrate improved coordination in RUE as evidenced by at least 8 blocks (box in blocks) in one minute  Baseline: 4 blocks Goal status: ACHIEVED 9 blocks   4.  Patient will demonstrate a 4lb increase in grip strength in RUE  Baseline: 6.1  Goal status: Achieved 5/31 - 12.9lb    5. Patient will complete at least 30% more of bathing and dressing tasks   Baseline:  Goal status: ACHIEVED  per pt wife-60-70%  6. Pt will assist with clothing management and hygiene with unilateral support with min A with toileting.  Baseline:  Goal Status: INITIAL - 5/31 - assisting with clothing but not hygiene   7. Pt will increase range of motion in AROM in RUE shoulder flexion by at least 10 degrees for increasing ability to use right, dominant, hand, for more functional reaching tasks and ADLs.  Baseline: 70 degrees  Goal Status: INITIAL       Plan     Clinical Impression Statement This progress report covers dates of service from 05/05/21- 06/17/21.  Pt with much improvement with shoulder flexion and range of motion with supine exercises today, and improved ability to participate in ADL.     OT Occupational Profile and History Detailed Assessment- Review of Records and additional review of physical, cognitive, psychosocial history related to current functional performance    Occupational performance deficits (Please refer to evaluation for details): ADL's;IADL's;Leisure    Body Structure / Function / Physical Skills ADL;Decreased knowledge of use of  DME;Gait;Obesity;Strength;Tone;GMC;Dexterity;Balance;Body mechanics;Edema;Proprioception;UE functional use;Cardiopulmonary status limiting activity;Endurance;IADL;ROM;Continence;Coordination;Flexibility;Mobility;Sensation;FMC;Decreased knowledge of precautions;Muscle spasms    Rehab Potential Good    Clinical Decision Making Several treatment options, min-mod task modification necessary    Comorbidities Affecting Occupational Performance: May have comorbidities impacting occupational performance    Modification or Assistance to Complete Evaluation  Min-Moderate modification of tasks or assist with assess necessary to complete eval    OT Frequency 2x / week    OT Duration 12 weeks    OT Treatment/Interventions Self-care/ADL training;Aquatic Therapy;DME and/or AE instruction;Splinting;Balance training;Therapeutic activities;Therapeutic exercise;Cognitive remediation/compensation;Passive range of motion;Functional Mobility Training;Neuromuscular education;Electrical Stimulation;Energy conservation;Patient/family education;Manual Therapy    Plan Functional use of RUE, continue towards increased mobility/transfers for increased independence and ability to complete ADLs   Consulted and Agree with Plan of Care Patient;Family member/caregiver    Family Member Consulted wife             Mariah Milling, [place] 06/17/2021, 4:47 PM

## 2021-06-17 NOTE — Therapy (Signed)
OUTPATIENT PHYSICAL THERAPY TREATMENT NOTE  Patient Name: Anthony Downs MRN: [id number] DOB:07/06/1950, 71 y.o., male Today's Date: 06/17/2021  PCP: Redmond School, MD REFERRING PROVIDER: [name], MD    PT End of Session - 06/17/21 1103     Visit Number 19    Number of Visits 33   plus eval   Date for PT Re-Evaluation 83/38/25   Recert   Authorization Type Medicare and UHC (needs 10th visit progress note)    Progress Note Due on Visit 20    PT Start Time 1101    PT Stop Time 1141    PT Time Calculation (min) 40 min    Equipment Utilized During Treatment Gait belt    Activity Tolerance Patient tolerated treatment well    Behavior During Therapy WFL for tasks assessed/performed                     Past Medical History:  Diagnosis Date   Acid reflux    Anxiety    Arthritis    Asthma    as a child   Basal cell carcinoma 01/26/2021   RIGHT POST SURICULAR INFERIOR   Basal cell carcinoma 01/26/2021   RIGHT POST AURICULAR SUPERIOR   Benign brain tumor ([place])    Cancer ([place])    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, squamous behind ear   Complication of anesthesia    slow to wake up and pain medicines make him sick   GERD (gastroesophageal reflux disease)    Hypercholesteremia    Hypertension    PONV (postoperative nausea and vomiting)    Sleep apnea    no Cpap   Subdural hematoma, 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   fell off truck hit head on concrete   Past Surgical History:  Procedure Laterality Date   APPLICATION OF CRANIAL NAVIGATION Left 04/29/2020   Procedure: [place];  Surgeon: Judith Part, MD;  Location: [facility];  Service: Neurosurgery;  Laterality: Left;   arthroscopic knee     COLONOSCOPY N/A 11/29/2018   Procedure: COLONOSCOPY;  Surgeon: Rogene Houston, MD;  [facility];  Service: Endoscopy;  Laterality: N/A;  730-rescheduled 9/2 same time per Ann   CRANIOTOMY Left 04/29/2020   Procedure: LEFT  CRANIECTOMY WITH TUMOR EXCISION;  Surgeon: Judith Part, MD;  Location: [facility];  Service: Neurosurgery;  Laterality: Left;   KNEE SURGERY Right    NASAL SEPTOPLASTY W/ TURBINOPLASTY Bilateral 03/19/2020   Procedure: NASAL SEPTOPLASTY WITH BILATERAL  TURBINATE REDUCTION;  Surgeon: Leta Baptist, MD;  Location: [facility];  Service: ENT;  Laterality: Bilateral;   VASECTOMY     Patient Active Problem List   Diagnosis Date Noted   Seizure ([place]) 08/13/2020   Seizures ([place]) 08/12/2020   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50 (ICD-10-CM) - 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[place]) R56.9 (ICD-10-CM) - Seizures ([place])    THERAPY DIAG:  Other abnormalities of gait and mobility  Muscle weakness (generalized)  Abnormal posture  PERTINENT HISTORY: anxiety, OA, cancer, subdural hematoma     PRECAUTIONS: Fall  SUBJECTIVE: Pt had skin cancer taken off R posterior head large bandage in place. Has been working on standing for longer periods of time. No new changes.   PAIN:  Are you having pain? 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 There are mild chronic small-vessel ischemic changes of the white matter. There is atrophy, encephalomalacia and gliosis along the medial aspects of both sides of the brain at the frontoparietal  vertex, sequela of the previous tumor and subsequent resection. It appears that the midportion of the superior sagittal sinus has been resected. No evidence of tumor in the venous sinuses anterior or posterior to that.  From MRI on 5/21: Redemonstrated sequela of prior vertex craniotomy/cranioplasty and parafalcine meningioma resection with partial resection of the superior sagittal sinus. No evidence of residual/recurrent tumor at the resection site.   Chronic intracranial findings without interval change, as described.   Paranasal sinus disease, as outlined.       TODAY'S TREATMENT:    NMR -Lateral board transfer from power chair to mat on L side (minor downhill slope) w/min A for RLE management and assistance posteriorly scooting onto board as pt came too far forward halfway through transfer. Pt demonstrated good head-hips relationship and hand placement throughout. Total A to place board but pt did not require assistance for lateral scoot. RPE of 5/10 following transfer.  -Sit <>sidelying <>supine on L side w/min A for RLE management. Pt able to hook LLE behind RLE independently after 3 attempts w/shoes on and lifted RLE ~50% of way to bring to mat, requiring min A to bring legs up remainder of way. Mod cues to breathe throughout.  -Supine <>sit (pushing up w/L side) on edge of mat w/min-mod A for RLE management, trunk support, and rolling assistance. Removed pt's shoes to facilitate pt hooking LLE under RLE to scoot BLEs to edge and pt able to hook w/slight tactile cues provided to R hamstring. Pt able to scoot legs off edge of mat and reach w/RLE towards edge of mat to roll and push-up. Min A for HHA to RUE to act as rail for pt to pull up with. Mod cues to maintain breathing throughout.    PATIENT EDUCATION: Education details: Goal assessment next week, continue rolling practice at home  Person educated: Patient and Spouse Education method: Explanation, Demonstration, Tactile cues, and  Verbal cues Education comprehension: verbalized understanding and needs further education     HOME EXERCISE PROGRAM: [email or web address]   ASSESSMENT:   CLINICAL IMPRESSION: Emphasis of skilled PT session on bed mobility and slide board transfers. Pt able to perform lateral board transfer w/min A to L side without verbal cues for sequencing or body mechanics. Pt continues to require min-mod A for sit <>supine transfers for RLE and trunk management but has significantly improved ability to lift RLE and roll via reaching w/RUE. Continue POC.   OBJECTIVE IMPAIRMENTS decreased activity tolerance, decreased balance, decreased coordination, decreased endurance, decreased knowledge of use of DME, decreased mobility, difficulty walking, decreased ROM, decreased strength, hypomobility, impaired perceived functional ability, impaired flexibility, impaired UE functional use, and postural dysfunction.    ACTIVITY LIMITATIONS cleaning, community activity, meal prep, laundry, and medication management.    PERSONAL FACTORS Time since onset  of injury/illness/exacerbation and 3+ comorbidities: anxiety, OA, cancer, subdural hematoma  are also affecting patient's functional outcome.      REHAB POTENTIAL: Fair due to amount of time since onset and previous progress with PT   CLINICAL DECISION MAKING: Evolving/moderate complexity   EVALUATION COMPLEXITY: Moderate     GOALS: Goals reviewed with patient? Yes     NEW SHORT TERM GOALS FOR UPDATED POC:   Target date: 06/25/2021  Pt will perform rolling R at S level and L at min level in order to indicate improved functional mobility.  Baseline:  able to roll R at S level with use of rail at home, required CGA/min A depending on fatigue level without rail Goal status: IN PROGRESS  2.  Pt will perform SL>sit at min A level and sit>SL at S level in order to indicate improved functional mobility.    Baseline: L SL to sit requires mod to max A with bil LE and trunk control-05/05/21 Goal status: INITIAL  3.  Pt will perform slideboard transfers with mod A consistently in order to indicate improved functional mobility.   Baseline: min A on 5/31  Goal status: INITIAL  4.  Pt will tolerate standing in stedy x 8 mins, with intermittent single UE support while reaching outside of BOS at mod I level in order to indicate improved functional mobility.   Baseline: 4:54s on 05/28/21 Goal status: INITIAL   NEW LONG TERM GOALS FOR UPDATED POC:  Target date: 07/23/2021  Pt/caregiver will be IND with final HEP in order to indicate improved functional mobility and dec fall risk. Baseline:  Goal status: IN PROGRESS  2.  Pt will tolerate standing in // bars x 10 mins, with intermittent single UE support while reaching outside of BOS at mod I level in order to indicate improved functional mobility.   Baseline:  Goal status: INITIAL  3.  Pt will improve bilat ankle DF by 5 degrees and bilat knee extension by 6 degrees for improved body mechanics in standing and functional mobility Baseline: On 5/11: R knee ext = -8, R ankle DF= -12, L knee ext = -17, L ankle DF = -25 Goal status: INITIAL  4.  Pt will perform sit<>stand in // bars at [facility] level in order to indicate improved functional mobility.  Baseline: min-max A 2/2 fatigue Goal status: INITIAL  5.  Pt will perform lateral scoot transfers with or without slide board w/min A for improved independence with transfers Baseline: min-max A w/slide board  Goal status: INITIAL  PLAN: PT FREQUENCY: 2x/week   PT DURATION: 16 weeks (recert)   PLANNED INTERVENTIONS: Therapeutic exercises, Therapeutic activity, Neuromuscular re-education, Balance training, Gait training, Patient/Family education, Joint mobilization, Vestibular training, Orthotic/Fit training, DME instructions, and Manual therapy   PLAN FOR NEXT SESSION:  20th visit PN. Practice sit  <>stand in // bars with wife. Lateral weight shifting in standing, rolling using PNF principles. Lateral scooting on mat, sit <>supine transfers, add strengthening to HEP as able. In therapy, use stedy for weight shifting, reaching,  trunk dissociation, sit <>stands in // bars.  He did so well seated in w/c facing mat, placing elbows on mat and lifting buttocks>coming to stand>weight shifting.  If we ever have +2A, I would love to try and get him prone.  He did not have a great experience with it in the past, but UEs were not positioned well. Sit > supine >sidelying and rolling practice, use of leg lifter.    Tyvion Edmondson E Sai Zinn,  PT, DPT 06/17/21, 11:46 AM

## 2021-06-22 ENCOUNTER — Encounter: Payer: Self-pay | Admitting: Occupational Therapy

## 2021-06-22 ENCOUNTER — Ambulatory Visit: Payer: Medicare Other | Attending: Internal Medicine | Admitting: Physical Therapy

## 2021-06-22 ENCOUNTER — Ambulatory Visit: Payer: Medicare Other | Admitting: Occupational Therapy

## 2021-06-22 DIAGNOSIS — R29898 Other symptoms and signs involving the musculoskeletal system: Secondary | ICD-10-CM | POA: Diagnosis present

## 2021-06-22 DIAGNOSIS — R2689 Other abnormalities of gait and mobility: Secondary | ICD-10-CM | POA: Insufficient documentation

## 2021-06-22 DIAGNOSIS — R278 Other lack of coordination: Secondary | ICD-10-CM | POA: Diagnosis present

## 2021-06-22 DIAGNOSIS — M25611 Stiffness of right shoulder, not elsewhere classified: Secondary | ICD-10-CM | POA: Diagnosis present

## 2021-06-22 DIAGNOSIS — M25621 Stiffness of right elbow, not elsewhere classified: Secondary | ICD-10-CM | POA: Insufficient documentation

## 2021-06-22 DIAGNOSIS — R293 Abnormal posture: Secondary | ICD-10-CM | POA: Insufficient documentation

## 2021-06-22 DIAGNOSIS — M6281 Muscle weakness (generalized): Secondary | ICD-10-CM

## 2021-06-22 DIAGNOSIS — R208 Other disturbances of skin sensation: Secondary | ICD-10-CM | POA: Diagnosis present

## 2021-06-22 DIAGNOSIS — R251 Tremor, unspecified: Secondary | ICD-10-CM | POA: Insufficient documentation

## 2021-06-22 DIAGNOSIS — R2681 Unsteadiness on feet: Secondary | ICD-10-CM | POA: Diagnosis present

## 2021-06-22 NOTE — Therapy (Signed)
OUTPATIENT PHYSICAL THERAPY TREATMENT NOTE- 20TH VISIT PROGRESS NOTE  Patient Name: DAEMYN GARIEPY MRN: [id number] DOB:08-26-1950, 71 y.o., male Today's Date: 06/22/2021  PCP: Redmond School, MD REFERRING PROVIDER: [name], MD   Physical Therapy Progress Note   Dates of Reporting Period:04/01/21 - 06/22/21  See Note below for Objective Data and Assessment of Progress/Goals.  Thank you for the referral of this patient. Mickie Bail Naraya Stoneberg, PT, DPT    PT End of Session - 06/22/21 1449     Visit Number 20    Number of Visits 33   plus eval   Date for PT Re-Evaluation 22/02/54   Recert   Authorization Type Medicare and [place] (needs 10th visit progress note)    Progress Note Due on Visit 20    PT Start Time 2706    PT Stop Time 1532    PT Time Calculation (min) 47 min    Equipment Utilized During Treatment Gait belt    Activity Tolerance Patient tolerated treatment well    Behavior During Therapy WFL for tasks assessed/performed                      Past Medical History:  Diagnosis Date   Acid reflux    Anxiety    Arthritis    Asthma    as a child   Basal cell carcinoma 01/26/2021   RIGHT POST SURICULAR INFERIOR   Basal cell carcinoma 01/26/2021   RIGHT POST AURICULAR SUPERIOR   Benign brain tumor ([place])    Cancer ([place])    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, squamous behind ear   Complication of anesthesia    slow to wake up and pain medicines make him sick   GERD (gastroesophageal reflux disease)    Hypercholesteremia    Hypertension    PONV (postoperative nausea and vomiting)    Sleep apnea    no Cpap   Subdural hematoma, 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   fell off truck hit head on concrete   Past Surgical History:  Procedure Laterality Date   APPLICATION OF CRANIAL NAVIGATION Left 04/29/2020   Procedure: [place];  Surgeon: Judith Part, MD;  Location: [facility];  Service: Neurosurgery;  Laterality: Left;    arthroscopic knee     COLONOSCOPY N/A 11/29/2018   Procedure: COLONOSCOPY;  Surgeon: Rogene Houston, MD;  [facility];  Service: Endoscopy;  Laterality: N/A;  730-rescheduled 9/2 same time per Ann   CRANIOTOMY Left 04/29/2020   Procedure: LEFT CRANIECTOMY WITH TUMOR EXCISION;  Surgeon: Judith Part, MD;  Location: [facility];  Service: Neurosurgery;  Laterality: Left;   KNEE SURGERY Right    NASAL SEPTOPLASTY W/ TURBINOPLASTY Bilateral 03/19/2020   Procedure: NASAL SEPTOPLASTY WITH BILATERAL  TURBINATE REDUCTION;  Surgeon: Leta Baptist, MD;  Location: [facility];  Service: ENT;  Laterality: Bilateral;   VASECTOMY     Patient Active Problem List   Diagnosis Date Noted   Seizure ([place]) 08/13/2020   Seizures ([place]) 08/12/2020   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   DVT (deep venous thrombosis) ([place]) 08/12/2020   COVID-19 virus infection 08/12/2020   Acute lower UTI 07/14/2020   Delirium 07/06/2020   Sleep apnea 07/06/2020   GERD (gastroesophageal reflux disease) 07/06/2020   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[place]) 06/25/2020   Sundowning 06/25/2020   Slow transit constipation    Essential hypertension    Hypokalemia    Postoperative pain    Meningioma ([place]) 05/02/2020   Brain tumor (  [place]) 04/28/2020   S/P nasal septoplasty 03/19/2020   Special screening for malignant neoplasms, colon 01/09/2018    REFERRING DIAG: D32.9 (ICD-10-CM) - Meningioma ([facility]) G82.50 (ICD-10-CM) - 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[place]) R56.9 (ICD-10-CM) - Seizures ([place])    THERAPY DIAG:  Other abnormalities of gait and mobility  Muscle weakness (generalized)  Abnormal posture  PERTINENT HISTORY: anxiety, OA, cancer, subdural hematoma     PRECAUTIONS: Fall  SUBJECTIVE: Pt reports having a good weekend, "My bars ain't ready". No other changes, head is doing better   PAIN:  Are you having pain? 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 There are mild chronic small-vessel ischemic changes of the white matter. There is atrophy, encephalomalacia and gliosis along the medial aspects of both sides of the brain at the frontoparietal vertex, sequela of the previous tumor and subsequent resection. It appears that the midportion of the superior sagittal sinus has been resected. No evidence of tumor in the venous sinuses anterior or posterior to that.  From MRI on 5/21: Redemonstrated sequela of prior vertex craniotomy/cranioplasty and parafalcine meningioma resection with partial resection of the superior sagittal sinus. No evidence of residual/recurrent tumor at the resection site.   Chronic intracranial findings without interval change, as described.   Paranasal sinus disease, as outlined.       TODAY'S TREATMENT:    NMR (STG assessment) -Lateral board transfer from power chair to mat on L side (minor downhill slope) w/min A for RLE foot placement only (!!!!). Pt demonstrated good head-hips relationship and hand placement throughout. Total A to place board but pt did not require assistance for lateral scoot. RPE of 5/10 following transfer.   -Sit <>sidelying <>supine on L side w/S* only!!!. Pt able to hook LLE behind RLE independently after 3 attempts w/o shoes on and lifted RLE to mat. Min cues to breathe throughout. RPE of 6/10 following transfer.   -Rolling to R side w/min A for tactile cues at pelvis. Pt able to reach to R side w/LUE but lacked momentum to bring pelvis over. Educated pt on using momentum to roll to side, using rail to use as target. Rolling to L side w/min A for HHA to imitate being a rail and tactile cues provided to pelvis to facilitate rotation.   -Supine > Sidelying >sit (pushing up w/L side) on edge of mat w/min A for trunk support and HHA to imitate rail for rolling assistance. Pt able to bring BLEs off edge of mat without assistance and no shoes. Mod cues to maintain breathing  throughout.   -Pt left seated edge of mat, handoff w/OT.    PATIENT EDUCATION: Education details: Goal assessment outcomes, practicing rolling at home w/emphasis on momentum  Person educated: Patient and Spouse Education method: Explanation, Demonstration, Tactile cues, and Verbal cues Education comprehension: verbalized understanding and needs further education   HOME EXERCISE PROGRAM: [email or web address]   ASSESSMENT:   CLINICAL IMPRESSION: Emphasis of skilled PT session on STG assessment. Pt has met 2 of 4 STGs, demonstrating consistency w/slide board transfers and SL <>sit w/S*-min A. Pt able to perform slide board transfer without assistance today, only requiring R foot to be repositioned in middle of transfer. Pt is progressing his rolling goal, as he is able to perform at home w/use of rail but needs to practice use of momentum to roll. Will assess final goal next session. Continue POC.  OBJECTIVE IMPAIRMENTS decreased activity tolerance, decreased balance, decreased coordination, decreased endurance, decreased knowledge of use of DME, decreased mobility, difficulty walking, decreased ROM, decreased strength, hypomobility, impaired perceived functional ability, impaired flexibility, impaired UE functional use, and postural dysfunction.    ACTIVITY LIMITATIONS cleaning, community activity, meal prep, laundry, and medication management.    PERSONAL FACTORS Time since onset of injury/illness/exacerbation and 3+ comorbidities: anxiety, OA, cancer, subdural hematoma  are also affecting patient's functional outcome.      REHAB POTENTIAL: Fair due to amount of time since onset and previous progress with PT   CLINICAL DECISION MAKING: Evolving/moderate complexity   EVALUATION COMPLEXITY: Moderate     GOALS: Goals reviewed with patient? Yes     NEW SHORT TERM GOALS FOR UPDATED POC:   Target date: 06/25/2021  Pt will  perform rolling R at S level and L at min level in order to indicate improved functional mobility.  Baseline:  able to roll R at S level with use of rail at home, required CGA/min A depending on fatigue level without rail; min A to R without rail,  Goal status: IN PROGRESS  2.  Pt will perform SL>sit at min A level and sit>SL at S level in order to indicate improved functional mobility.   Baseline: L SL to sit requires mod to max A with bil LE and trunk control-05/05/21; sit > SL at S*, Supine > SL > Sit w/min A on 6/5  Goal status: MET  3.  Pt will perform slideboard transfers with mod A consistently in order to indicate improved functional mobility.   Baseline: min A on 5/31 and 6/5 Goal status: MET  4.  Pt will tolerate standing in stedy x 8 mins, with intermittent single UE support while reaching outside of BOS at mod I level in order to indicate improved functional mobility.   Baseline: 4:54s on 05/28/21 Goal status: INITIAL   NEW LONG TERM GOALS FOR UPDATED POC:  Target date: 07/23/2021  Pt/caregiver will be IND with final HEP in order to indicate improved functional mobility and dec fall risk. Baseline:  Goal status: IN PROGRESS  2.  Pt will tolerate standing in // bars x 10 mins, with intermittent single UE support while reaching outside of BOS at mod I level in order to indicate improved functional mobility.   Baseline:  Goal status: INITIAL  3.  Pt will improve bilat ankle DF by 5 degrees and bilat knee extension by 6 degrees for improved body mechanics in standing and functional mobility Baseline: On 5/11: R knee ext = -8, R ankle DF= -12, L knee ext = -17, L ankle DF = -25 Goal status: INITIAL  4.  Pt will perform sit<>stand in // bars at [facility] level in order to indicate improved functional mobility.  Baseline: min-max A 2/2 fatigue Goal status: INITIAL  5.  Pt will perform lateral scoot transfers with or without slide board w/min A for improved independence with  transfers Baseline: min-max A w/slide board  Goal status: INITIAL  PLAN: PT FREQUENCY: 2x/week   PT DURATION: 16 weeks (recert)   PLANNED INTERVENTIONS: Therapeutic exercises, Therapeutic activity, Neuromuscular re-education, Balance training, Gait training, Patient/Family education, Joint mobilization, Vestibular training, Orthotic/Fit training, DME instructions, and Manual therapy   PLAN FOR NEXT SESSION:  Assess last STG (standing in [place]). Practice sit <>stand in // bars with wife. Lateral weight shifting in standing, rolling using PNF principles. Lateral scooting on mat, sit <>supine transfers, add strengthening to HEP as able.  In therapy, use stedy for weight shifting, reaching,  trunk dissociation, sit <>stands in // bars.  He did so well seated in w/c facing mat, placing elbows on mat and lifting buttocks>coming to stand>weight shifting.  If we ever have +2A, I would love to try and get him prone.  He did not have a great experience with it in the past, but UEs were not positioned well. Sit > supine >sidelying and rolling practice.    Cruzita Lederer Janelle Spellman, PT, DPT 06/22/21, 3:39 PM
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
## 2021-06-24 ENCOUNTER — Ambulatory Visit: Payer: Medicare Other | Admitting: Rehabilitation

## 2021-06-24 ENCOUNTER — Encounter: Payer: Self-pay | Admitting: Rehabilitation

## 2021-06-24 ENCOUNTER — Encounter: Payer: Self-pay | Admitting: Occupational Therapy

## 2021-06-24 ENCOUNTER — Ambulatory Visit: Payer: Medicare Other | Admitting: Occupational Therapy

## 2021-06-24 DIAGNOSIS — R2689 Other abnormalities of gait and mobility: Secondary | ICD-10-CM

## 2021-06-24 DIAGNOSIS — R293 Abnormal posture: Secondary | ICD-10-CM

## 2021-06-24 DIAGNOSIS — M25611 Stiffness of right shoulder, not elsewhere classified: Secondary | ICD-10-CM

## 2021-06-24 DIAGNOSIS — M25621 Stiffness of right elbow, not elsewhere classified: Secondary | ICD-10-CM

## 2021-06-24 DIAGNOSIS — M6281 Muscle weakness (generalized): Secondary | ICD-10-CM

## 2021-06-24 DIAGNOSIS — R208 Other disturbances of skin sensation: Secondary | ICD-10-CM

## 2021-06-24 DIAGNOSIS — R2681 Unsteadiness on feet: Secondary | ICD-10-CM

## 2021-06-24 DIAGNOSIS — R278 Other lack of coordination: Secondary | ICD-10-CM
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
## 2021-06-24 NOTE — Therapy (Signed)
OUTPATIENT PHYSICAL THERAPY TREATMENT NOTE- 20TH VISIT PROGRESS NOTE  Patient Name: Anthony Downs MRN: [id number] DOB:1950-11-16, 71 y.o., male Today's Date: 06/24/2021  PCP: Redmond School, MD REFERRING PROVIDER: [name], MD   Physical Therapy Progress Note   Dates of Reporting Period:04/01/21 - 06/22/21  See Note below for Objective Data and Assessment of Progress/Goals.  Thank you for the referral of this patient. Mickie Bail Plaster, PT, DPT    PT End of Session - 06/24/21 1539     Visit Number 21    Number of Visits 33   plus eval   Date for PT Re-Evaluation 09/32/35   Recert   Authorization Type Medicare and [place] (needs 10th visit progress note)    Progress Note Due on Visit 30    PT Start Time 1316    PT Stop Time 1400    PT Time Calculation (min) 44 min    Equipment Utilized During Treatment Gait belt    Activity Tolerance Patient tolerated treatment well    Behavior During Therapy WFL for tasks assessed/performed                      Past Medical History:  Diagnosis Date   Acid reflux    Anxiety    Arthritis    Asthma    as a child   Basal cell carcinoma 01/26/2021   RIGHT POST SURICULAR INFERIOR   Basal cell carcinoma 01/26/2021   RIGHT POST AURICULAR SUPERIOR   Benign brain tumor ([place])    Cancer ([place])    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, squamous behind ear   Complication of anesthesia    slow to wake up and pain medicines make him sick   GERD (gastroesophageal reflux disease)    Hypercholesteremia    Hypertension    PONV (postoperative nausea and vomiting)    Sleep apnea    no Cpap   Subdural hematoma, 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   fell off truck hit head on concrete   Past Surgical History:  Procedure Laterality Date   APPLICATION OF CRANIAL NAVIGATION Left 04/29/2020   Procedure: [place];  Surgeon: Judith Part, MD;  Location: [facility];  Service: Neurosurgery;  Laterality: Left;    arthroscopic knee     COLONOSCOPY N/A 11/29/2018   Procedure: COLONOSCOPY;  Surgeon: Rogene Houston, MD;  [facility];  Service: Endoscopy;  Laterality: N/A;  730-rescheduled 9/2 same time per Ann   CRANIOTOMY Left 04/29/2020   Procedure: LEFT CRANIECTOMY WITH TUMOR EXCISION;  Surgeon: Judith Part, MD;  Location: [facility];  Service: Neurosurgery;  Laterality: Left;   KNEE SURGERY Right    NASAL SEPTOPLASTY W/ TURBINOPLASTY Bilateral 03/19/2020   Procedure: NASAL SEPTOPLASTY WITH BILATERAL  TURBINATE REDUCTION;  Surgeon: Leta Baptist, MD;  Location: [facility];  Service: ENT;  Laterality: Bilateral;   VASECTOMY     Patient Active Problem List   Diagnosis Date Noted   Seizure ([place]) 08/13/2020   Seizures ([place]) 08/12/2020   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) 04/28/2020   S/P nasal septoplasty 03/19/2020   Special screening for malignant neoplasms, colon 01/09/2018    REFERRING DIAG: D32.9 (ICD-10-CM) - Meningioma ([facility]) G82.50 (ICD-10-CM) - 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[place]) R56.9 (ICD-10-CM) - Seizures ([place])    THERAPY DIAG:  Abnormal posture  Unsteadiness on feet  Muscle weakness (generalized)  Other abnormalities of gait and mobility  PERTINENT HISTORY: anxiety, OA, cancer, subdural hematoma     PRECAUTIONS: Fall  SUBJECTIVE: Pt reports having a good weekend, "My bars ain't ready". No other changes, head is doing better   PAIN:  Are you having pain? 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 There are mild chronic small-vessel ischemic changes of the white matter. There is atrophy, encephalomalacia and gliosis along the medial aspects of both sides of the brain at the frontoparietal vertex, sequela of the previous tumor and subsequent resection. It appears that the midportion of the superior sagittal sinus has been resected. No evidence of tumor in the venous sinuses anterior or posterior to that.  From MRI on 5/21: Redemonstrated sequela of prior vertex craniotomy/cranioplasty and parafalcine meningioma resection with partial resection of the superior sagittal sinus. No evidence of residual/recurrent tumor at the resection site.   Chronic intracranial findings without interval change, as described.   Paranasal sinus disease, as outlined.       TODAY'S TREATMENT:    TA:    Performed several standing attempts in steady today, attempting to don/doff shirt (hospital gown), perform clothes pin reaching task, however he could only remain in full standing for up to 1.30 mins.  He reports that it just "doesn't feel the same as the one I have at home."  Maybe legs were in a different position.  Therefore transitioned to // bars where he was able to stand x 4 reps with BUE support (shelf liner placed under hands for improved grip) performing UE flex x 10 reps on each side, alt UE flexion x 10 reps, weight shifting laterally x 10 reps, wiggling LLE/attempting to wiggle RLE.  Then he was able to advance/retrostep LLE x 5 reps (2 sets) (PT did block R knee for safety).  He did very well!   PATIENT EDUCATION: Education details: Goal assessment outcomes, practicing rolling at home w/emphasis on momentum  Person educated: Patient and Spouse Education method: Explanation, Demonstration, Tactile cues, and Verbal cues Education comprehension: verbalized understanding and needs further education   HOME EXERCISE  PROGRAM: [email or web address]   ASSESSMENT:   CLINICAL IMPRESSION: Skilled session focused on assessment of remaining STG with standing in stedy.  He was only able to stand for 1 min, 30 secs maximum while performing UE task/simulated ADLs.  Pt seemed more fatigued today, though he could not think of why.  He did perform several good sit<>stand in // bars performing intermittent UE task and was even able to advance LLE several reps!!  OBJECTIVE IMPAIRMENTS decreased activity tolerance, decreased balance, decreased coordination, decreased endurance, decreased knowledge of use of DME, decreased mobility, difficulty walking, decreased ROM, decreased strength, hypomobility, impaired perceived functional ability, impaired flexibility, impaired UE functional use, and postural dysfunction.    ACTIVITY LIMITATIONS cleaning, community activity, meal prep, laundry, and medication management.    PERSONAL FACTORS Time since onset of injury/illness/exacerbation and 3+ comorbidities: anxiety, OA, cancer, subdural hematoma  are also affecting patient's functional outcome.      REHAB POTENTIAL: Fair due to  amount of time since onset and previous progress with PT   CLINICAL DECISION MAKING: Evolving/moderate complexity   EVALUATION COMPLEXITY: Moderate     GOALS: Goals reviewed with patient? Yes     NEW SHORT TERM GOALS FOR UPDATED POC:   Target date: 06/25/2021  Pt will perform rolling R at S level and L at min level in order to indicate improved functional mobility.  Baseline:  able to roll R at S level with use of rail at home, required CGA/min A depending on fatigue level without rail; min A to R without rail,  Goal status: IN PROGRESS  2.  Pt will perform SL>sit at min A level and sit>SL at S level in order to indicate improved functional mobility.   Baseline: L SL to sit requires mod to max A with bil LE and trunk control-05/05/21;  sit > SL at S*, Supine > SL > Sit w/min A on 6/5  Goal status: MET  3.  Pt will perform slideboard transfers with mod A consistently in order to indicate improved functional mobility.   Baseline: min A on 5/31 and 6/5 Goal status: MET  4.  Pt will tolerate standing in stedy x 8 mins, with intermittent single UE support while reaching outside of BOS at mod I level in order to indicate improved functional mobility.   Baseline: Was only able to do up to 1.30 mins today while performing simulated ADLs.  Goal status: IN PROGRESS   NEW LONG TERM GOALS FOR UPDATED POC:  Target date: 07/23/2021  Pt/caregiver will be IND with final HEP in order to indicate improved functional mobility and dec fall risk. Baseline:  Goal status: IN PROGRESS  2.  Pt will tolerate standing in // bars x 10 mins, with intermittent single UE support while reaching outside of BOS at mod I level in order to indicate improved functional mobility.   Baseline:  Goal status: INITIAL  3.  Pt will improve bilat ankle DF by 5 degrees and bilat knee extension by 6 degrees for improved body mechanics in standing and functional mobility Baseline: On 5/11: R knee ext = -8, R ankle DF= -12, L knee ext = -17, L ankle DF = -25 Goal status: INITIAL  4.  Pt will perform sit<>stand in // bars at [facility] level in order to indicate improved functional mobility.  Baseline: min-max A 2/2 fatigue Goal status: INITIAL  5.  Pt will perform lateral scoot transfers with or without slide board w/min A for improved independence with transfers Baseline: min-max A w/slide board  Goal status: INITIAL  PLAN: PT FREQUENCY: 2x/week   PT DURATION: 16 weeks (recert)   PLANNED INTERVENTIONS: Therapeutic exercises, Therapeutic activity, Neuromuscular re-education, Balance training, Gait training, Patient/Family education, Joint mobilization, Vestibular training, Orthotic/Fit training, DME instructions, and Manual therapy   PLAN FOR NEXT SESSION:    Practice sit <>stand in // bars with wife. Lateral weight shifting in standing, rolling using PNF principles. Lateral scooting on mat, sit <>supine transfers, add strengthening to HEP as able. In therapy, use stedy for weight shifting, reaching,  trunk dissociation, sit <>stands in // bars.  He did so well seated in w/c facing mat, placing elbows on mat and lifting buttocks>coming to stand>weight shifting.  If we ever have +2A, I would love to try and get him prone.  He did not have a great experience with it in the past, but UEs were not positioned well. Sit > supine >sidelying and rolling practice.  Anthony Downs, PT, MPT [facility] [address] [place], [place], [address] Phone: [phone]   Fax:  [phone] 06/24/21, 3:47 PM

## 2021-06-29 ENCOUNTER — Encounter: Payer: Self-pay | Admitting: Occupational Therapy

## 2021-06-29 ENCOUNTER — Ambulatory Visit: Payer: Medicare Other | Admitting: Physical Therapy

## 2021-06-29 ENCOUNTER — Ambulatory Visit: Payer: Medicare Other | Admitting: Occupational Therapy

## 2021-06-29 DIAGNOSIS — R208 Other disturbances of skin sensation: Secondary | ICD-10-CM

## 2021-06-29 DIAGNOSIS — M6281 Muscle weakness (generalized): Secondary | ICD-10-CM

## 2021-06-29 DIAGNOSIS — R278 Other lack of coordination: Secondary | ICD-10-CM

## 2021-06-29 DIAGNOSIS — R2681 Unsteadiness on feet: Secondary | ICD-10-CM

## 2021-06-29 DIAGNOSIS — R2689 Other abnormalities of gait and mobility: Secondary | ICD-10-CM

## 2021-06-29 DIAGNOSIS — M25611 Stiffness of right shoulder, not elsewhere classified: Secondary | ICD-10-CM

## 2021-06-29 DIAGNOSIS — M25621 Stiffness of right elbow, not elsewhere classified: Secondary | ICD-10-CM

## 2021-06-29 NOTE — Therapy (Signed)
OUTPATIENT OCCUPATIONAL THERAPY TREATMENT   Patient Name: Anthony Downs MRN: [id number] DOB:09-Oct-1950, 71 y.o., male Today's Date: 06/29/2021  PCP: Redmond School, MD REFERRING PROVIDER: Alger Simons MD  END OF SESSION:   OT End of Session - 06/29/21 1319     Visit Number 23    Number of Visits 41    Date for OT Re-Evaluation 08/20/21    Authorization Type Medicare and UHC    Authorization Time Period UHC VL - Need medical auth after 30 visits    OT Start Time 1319    OT Stop Time 1400    OT Time Calculation (min) 41 min    Equipment Utilized During Treatment equalizer    Activity Tolerance Patient tolerated treatment well    Behavior During Therapy WFL for tasks assessed/performed                ONSET DATE: 04/28/20  REFERRING DIAG: Meningioma, Tetraplegia  THERAPY DIAG:  Muscle weakness (generalized)  Other lack of coordination  Other abnormalities of gait and mobility  Unsteadiness on feet  Stiffness of right elbow, not elsewhere classified  Other disturbances of skin sensation  Stiffness of right shoulder, not elsewhere classified     Past Medical History:  Diagnosis Date   Acid reflux    Anxiety    Arthritis    Asthma    as a child   Basal cell carcinoma 01/26/2021   RIGHT POST SURICULAR INFERIOR   Basal cell carcinoma 01/26/2021   RIGHT POST AURICULAR SUPERIOR   Benign brain tumor ([place])    Cancer ([place])    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, squamous behind ear   Complication of anesthesia    slow to wake up and pain medicines make him sick   GERD (gastroesophageal reflux disease)    Hypercholesteremia    Hypertension    PONV (postoperative nausea and vomiting)    Sleep apnea    no Cpap   Subdural hematoma, 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   fell off truck hit head on concrete   Past Surgical History:  Procedure Laterality Date   APPLICATION OF CRANIAL NAVIGATION Left 04/29/2020   Procedure: [place];   Surgeon: Judith Part, MD;  Location: [place];  Service: Neurosurgery;  Laterality: Left;   arthroscopic knee     COLONOSCOPY N/A 11/29/2018   Procedure: COLONOSCOPY;  Surgeon: Rogene Houston, MD;  [facility];  Service: Endoscopy;  Laterality: N/A;  730-rescheduled 9/2 same time per Ann   CRANIOTOMY Left 04/29/2020   Procedure: LEFT CRANIECTOMY WITH TUMOR EXCISION;  Surgeon: Judith Part, MD;  Location: [place];  Service: Neurosurgery;  Laterality: Left;   KNEE SURGERY Right    NASAL SEPTOPLASTY W/ TURBINOPLASTY Bilateral 03/19/2020   Procedure: NASAL SEPTOPLASTY WITH BILATERAL  TURBINATE REDUCTION;  Surgeon: Leta Baptist, MD;  Location: [place];  Service: ENT;  Laterality: Bilateral;   VASECTOMY     Patient Active Problem List   Diagnosis Date Noted   Seizure ([place]) 08/13/2020   Seizures ([place]) 08/12/2020   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   DVT (deep venous thrombosis) ([place]) 08/12/2020   COVID-19 virus infection 08/12/2020   Acute lower UTI 07/14/2020   Delirium 07/06/2020   Sleep apnea 07/06/2020   GERD (gastroesophageal reflux disease) 07/06/2020   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[place]) 06/25/2020   Sundowning 06/25/2020   Slow transit constipation    Essential hypertension    Hypokalemia    Postoperative pain    Meningioma (  [place]) 05/02/2020   Brain tumor ([place]) 04/28/2020   S/P nasal septoplasty 03/19/2020   Special screening for malignant neoplasms, colon 01/09/2018    ONSET DATE: 04/28/20  REFERRING DIAG: Meningioma, Tetraplegia  PERTINENT HISTORY: traumatic SDH 2008  PRECAUTIONS: Fall Risk  SUBJECTIVE: Pt denies any pain today  PAIN:  Are you having pain? No    TODAY'S TREATMENT:  06/29/21 Semicircle Pegboard with RUE with min/mod difficulty and min drops.  Practiced using bent/angled spoon to retrieve beans from bowl and bring to mouth with minimal difficulty. Pt with decreased tremors today.     GOALS: Goals reviewed with patient? Yes   SHORT TERM  GOALS: Target date: 06/26/2021    1.  Patient and caregiver will complete HEP designed to improve passive and active range of motion in BUE  Baseline:  Goal status: ACHIEVED   2.  Patient will utilize RUE to assist with at least one ADL task with mod cueing and minimal assistance  Baseline:  Goal status: ACHIEVED   3.  Patient will reach at least mid calf toward feet with BUE in preparation for participating in LB dressing  Baseline:  Goal status: ACHIEVED   4. Patient will demonstrate sufficient lateral lean left or right in seated position to prepare for placement of sliding board to encourage more active transfers and increase potential for commode transfers.                          Baseline:                          Goal status: ACHIEVED  5. Pt will be educated on available AE and DME to assist with ADL completion as needed for improved independence.   Baseline:  Goal status: ACHIEVED  6. Patient will demonstrate improved coordination in RUE as evidenced by at least 15 blocks (box in blocks) in one minute   Baseline: 9 blocks at renewal 05/28/21  Goal status: INITIAL - CONTINUE GOAL      LONG TERM GOALS: Target date: 08/20/2021   1.  Patient will complete updated HEP designed to improve range, strength, and functional use of BUE  Baseline:  Goal status: ONGOING   2.  Patient will complete a level surface transfer with slide board PRN with no more than mod assistance in preparation for commode transfer  Baseline:  Goal status: REVISED/ONGOING currently inconsistent with SB transfers   3.  Patient will demonstrate improved coordination in RUE as evidenced by at least 8 blocks (box in blocks) in one minute  Baseline: 4 blocks Goal status: ACHIEVED 9 blocks   4.  Patient will demonstrate a 4lb increase in grip strength in RUE  Baseline: 6.1  Goal status: Achieved 5/31 - 12.9lb    5. Patient will complete at least 30% more of bathing and dressing tasks   Baseline:  Goal  status: ACHIEVED  per pt wife-60-70%  6. Pt will assist with clothing management and hygiene with unilateral support with min A with toileting.  Baseline:  Goal Status: INITIAL - 5/31 - assisting with clothing but not hygiene   7. Pt will increase range of motion in AROM in RUE shoulder flexion by at least 10 degrees for increasing ability to use right, dominant, hand, for more functional reaching tasks and ADLs.  Baseline: 70 degrees  Goal Status: INITIAL       Plan     Clinical  Impression Statement Pt pleased with progress, and he and his wife are dilligent in continuing to work on functional skills at home.    OT Occupational Profile and History Detailed Assessment- Review of Records and additional review of physical, cognitive, psychosocial history related to current functional performance    Occupational performance deficits (Please refer to evaluation for details): ADL's;IADL's;Leisure    Body Structure / Function / Physical Skills ADL;Decreased knowledge of use of DME;Gait;Obesity;Strength;Tone;GMC;Dexterity;Balance;Body mechanics;Edema;Proprioception;UE functional use;Cardiopulmonary status limiting activity;Endurance;IADL;ROM;Continence;Coordination;Flexibility;Mobility;Sensation;FMC;Decreased knowledge of precautions;Muscle spasms    Rehab Potential Good    Clinical Decision Making Several treatment options, min-mod task modification necessary    Comorbidities Affecting Occupational Performance: May have comorbidities impacting occupational performance    Modification or Assistance to Complete Evaluation  Min-Moderate modification of tasks or assist with assess necessary to complete eval    OT Frequency 2x / week    OT Duration 12 weeks    OT Treatment/Interventions Self-care/ADL training;Aquatic Therapy;DME and/or AE instruction;Splinting;Balance training;Therapeutic activities;Therapeutic exercise;Cognitive remediation/compensation;Passive range of motion;Functional Mobility  Training;Neuromuscular education;Electrical Stimulation;Energy conservation;Patient/family education;Manual Therapy    Plan Functional use of RUE, continue towards increased mobility/transfers for increased independence and ability to complete ADLs   Consulted and Agree with Plan of Care Patient;Family member/caregiver    Family Member Consulted wife             Zachery Conch, [place] 06/29/2021, 1:20 PM

## 2021-06-29 NOTE — Therapy (Signed)
OUTPATIENT PHYSICAL THERAPY TREATMENT NOTE  Patient Name: Anthony Downs MRN: [id number] DOB:Nov 26, 1950, 71 y.o., male Today's Date: 06/29/2021  PCP: Elfredia Nevins, MD REFERRING PROVIDER: Ranelle Oyster, MD       PT End of Session - 06/29/21 1236     Visit Number 22    Number of Visits 33   plus eval   Date for PT Re-Evaluation 08/20/21   Recert   Authorization Type Medicare and UHC (needs 10th visit progress note)    Progress Note Due on Visit 30    PT Start Time 1233    PT Stop Time 1315    PT Time Calculation (min) 42 min    Equipment Utilized During Treatment Gait belt    Activity Tolerance Patient tolerated treatment well    Behavior During Therapy WFL for tasks assessed/performed                       Past Medical History:  Diagnosis Date   Acid reflux    Anxiety    Arthritis    Asthma    as a child   Basal cell carcinoma 01/26/2021   RIGHT POST SURICULAR INFERIOR   Basal cell carcinoma 01/26/2021   RIGHT POST AURICULAR SUPERIOR   Benign brain tumor ([facility])    Cancer ([facility])    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, squamous behind ear   Complication of anesthesia    slow to wake up and pain medicines make him sick   GERD (gastroesophageal reflux disease)    Hypercholesteremia    Hypertension    PONV (postoperative nausea and vomiting)    Sleep apnea    no Cpap   Subdural hematoma, post-traumatic ([facility]) 2008   fell off truck hit head on concrete   Past Surgical History:  Procedure Laterality Date   APPLICATION OF CRANIAL NAVIGATION Left 04/29/2020   Procedure: APPLICATION OF CRANIAL NAVIGATION;  Surgeon: Jadene Pierini, MD;  [facility];  Service: Neurosurgery;  Laterality: Left;   arthroscopic knee     COLONOSCOPY N/A 11/29/2018   Procedure: COLONOSCOPY;  Surgeon: Malissa Hippo, MD;  [facility];  Service: Endoscopy;  Laterality: N/A;  730-rescheduled 9/2 same time per Ann   CRANIOTOMY Left 04/29/2020    Procedure: LEFT CRANIECTOMY WITH TUMOR EXCISION;  Surgeon: Jadene Pierini, MD;  Location[facility];  Service: Neurosurgery;  Laterality: Left;   KNEE SURGERY Right    NASAL SEPTOPLASTY W/ TURBINOPLASTY Bilateral 03/19/2020   Procedure: NASAL SEPTOPLASTY WITH BILATERAL  TURBINATE REDUCTION;  Surgeon: Newman Pies, MD;  Location[facility];  Service: ENT;  Laterality: Bilateral;   VASECTOMY     Patient Active Problem List   Diagnosis Date Noted   Seizure ([facility]) 08/13/2020   Seizures ([facility]) 08/12/2020   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   DVT (deep venous thrombosis) ([facility]) 08/12/2020   COVID-19 virus infection 08/12/2020   Acute lower UTI 07/14/2020   Delirium 07/06/2020   Sleep apnea 07/06/2020   GERD (gastroesophageal reflux disease) 07/06/2020   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[facility]) 06/25/2020   Sundowning 06/25/2020   Slow transit constipation    Essential hypertension    Hypokalemia    Postoperative pain    Meningioma ([facility]) 05/02/2020   Brain tumor ([facility]) 04/28/2020   S/P nasal septoplasty 03/19/2020   Special screening for malignant neoplasms, colon 01/09/2018    REFERRING DIAG: D32.9 (ICD-10-CM) - Meningioma ([facility]) G82.50 (ICD-10-CM) - 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[facility]) R56.9 (ICD-10-CM) - Seizures ([facility])  THERAPY DIAG:  Muscle weakness (generalized)  Other abnormalities of gait and mobility  Unsteadiness on feet  PERTINENT HISTORY: anxiety, OA, cancer, subdural hematoma     PRECAUTIONS: Fall  SUBJECTIVE: Pt reports having a good weekend, feeling much better. Still does not have // bars at home but stood >16min in stedy (wife showed therapist video) with intermittent UE support   PAIN:  Are you having pain? 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 There are mild chronic small-vessel ischemic changes of the white matter. There is atrophy, encephalomalacia and gliosis along the medial  aspects of both sides of the brain at the frontoparietal vertex, sequela of the previous tumor and subsequent resection. It appears that the midportion of the superior sagittal sinus has been resected. No evidence of tumor in the venous sinuses anterior or posterior to that.  From MRI on 5/21: Redemonstrated sequela of prior vertex craniotomy/cranioplasty and parafalcine meningioma resection with partial resection of the superior sagittal sinus. No evidence of residual/recurrent tumor at the resection site.   Chronic intracranial findings without interval change, as described.   Paranasal sinus disease, as outlined.       TODAY'S TREATMENT:   NMR  In // bars for improved standing tolerance, lateral weight shifting and pre-gait training:  -Sit <>stand w/min A and pt stood for 6 minutes w/intermittent UE support. Pt stood without UE support for 30s, noted significant anterior/posterior sway requiring min A for steadying assist. No R knee block required throughout. - Pt performed remainder of sit <>stands w/CGA and BUE support on rails  -Pt ambulated 8' x2 w/BUE support on rails, close WC follow (wife can put power chair in manual WC mode) and mod A for RLE management. Pt able to progress LLE without assistance. Mod multimodal cues provided to facilitate lateral weight shifting, as pt unable to fully unweight RLE fully for therapist to progress leg on first attempt. On second attempt, pt able to weight shift well, requiring very minimal assistance to progress RLE. Therapist sat on stool in front of pt and provided manual knee block throughout for added stability.    PATIENT EDUCATION: Education details: Continue HEP, practice standing marching in [place] to facilitate weight shift  Person educated: Patient and Spouse Education method: Explanation, Demonstration, Tactile cues, and Verbal cues Education comprehension: verbalized understanding and needs further education   HOME EXERCISE  PROGRAM: [email or web address]   ASSESSMENT:   CLINICAL IMPRESSION: Emphasis of skilled PT session on gait training in // bars. Pt able to walk 8' twice today w/assistance progressing RLE only. Pt continues to have difficulty with offloading weight from RLE to assist in progressing it forward. Educated pt on performing standing marches in [place] at home to facilitate lateral weight shift to prime for gait training in clinic. Pt and wife verbalized understanding. Continue POC.   OBJECTIVE IMPAIRMENTS decreased activity tolerance, decreased balance, decreased coordination, decreased endurance, decreased knowledge of use of DME, decreased mobility, difficulty walking, decreased ROM, decreased strength, hypomobility, impaired perceived functional ability, impaired flexibility, impaired UE functional use, and postural dysfunction.    ACTIVITY LIMITATIONS cleaning, community activity, meal prep, laundry, and medication management.    PERSONAL FACTORS Time since onset of injury/illness/exacerbation and 3+ comorbidities: anxiety, OA, cancer, subdural hematoma  are also affecting patient's functional outcome.      REHAB POTENTIAL: Fair due to amount of time since onset and previous  progress with PT   CLINICAL DECISION MAKING: Evolving/moderate complexity   EVALUATION COMPLEXITY: Moderate     GOALS: Goals reviewed with patient? Yes     NEW SHORT TERM GOALS FOR UPDATED POC:   Target date: 06/25/2021  Pt will perform rolling R at S level and L at min level in order to indicate improved functional mobility.  Baseline:  able to roll R at S level with use of rail at home, required CGA/min A depending on fatigue level without rail; min A to R without rail,  Goal status: IN PROGRESS  2.  Pt will perform SL>sit at min A level and sit>SL at S level in order to indicate improved functional mobility.   Baseline: L SL to sit requires mod to max A  with bil LE and trunk control-05/05/21; sit > SL at S*, Supine > SL > Sit w/min A on 6/5  Goal status: MET  3.  Pt will perform slideboard transfers with mod A consistently in order to indicate improved functional mobility.   Baseline: min A on 5/31 and 6/5 Goal status: MET  4.  Pt will tolerate standing in stedy x 8 mins, with intermittent single UE support while reaching outside of BOS at mod I level in order to indicate improved functional mobility.   Baseline: Was only able to do up to 1.30 mins today while performing simulated ADLs.  Goal status: IN PROGRESS   NEW LONG TERM GOALS FOR UPDATED POC:  Target date: 07/23/2021  Pt/caregiver will be IND with final HEP in order to indicate improved functional mobility and dec fall risk. Baseline:  Goal status: IN PROGRESS  2.  Pt will tolerate standing in // bars x 10 mins, with intermittent single UE support while reaching outside of BOS at mod I level in order to indicate improved functional mobility.   Baseline:  Goal status: INITIAL  3.  Pt will improve bilat ankle DF by 5 degrees and bilat knee extension by 6 degrees for improved body mechanics in standing and functional mobility Baseline: On 5/11: R knee ext = -8, R ankle DF= -12, L knee ext = -17, L ankle DF = -25 Goal status: INITIAL  4.  Pt will perform sit<>stand in // bars at [facility] level in order to indicate improved functional mobility.  Baseline: min-max A 2/2 fatigue Goal status: INITIAL  5.  Pt will perform lateral scoot transfers with or without slide board w/min A for improved independence with transfers Baseline: min-max A w/slide board  Goal status: INITIAL  PLAN: PT FREQUENCY: 2x/week   PT DURATION: 16 weeks (recert)   PLANNED INTERVENTIONS: Therapeutic exercises, Therapeutic activity, Neuromuscular re-education, Balance training, Gait training, Patient/Family education, Joint mobilization, Vestibular training, Orthotic/Fit training, DME instructions, and Manual  therapy   PLAN FOR NEXT SESSION:   Continue gait training in // bars. Provide handout of standing marches for HEP. Practice sit <>stand in // bars with wife. Lateral weight shifting in standing, rolling using PNF principles. Lateral scooting on mat, sit <>supine transfers, add strengthening to HEP as able. In therapy, use stedy for weight shifting, reaching,  trunk dissociation, sit <>stands in // bars.  He did so well seated in w/c facing mat, placing elbows on mat and lifting buttocks>coming to stand>weight shifting.  If we ever have +2A, I would love to try and get him prone.  He did not have a great experience with it in the past, but UEs were not positioned well. Sit > supine >sidelying  and rolling practice.    Cruzita Lederer Gilberta Peeters, PT, DPT 06/29/21, 2:34 PM

## 2021-07-01 ENCOUNTER — Ambulatory Visit: Payer: Medicare Other | Admitting: Physical Medicine & Rehabilitation

## 2021-07-06 ENCOUNTER — Ambulatory Visit: Payer: Medicare Other | Admitting: Occupational Therapy

## 2021-07-06 ENCOUNTER — Ambulatory Visit: Payer: Medicare Other | Admitting: Physical Therapy

## 2021-07-06 ENCOUNTER — Encounter: Payer: Self-pay | Admitting: Occupational Therapy

## 2021-07-06 ENCOUNTER — Ambulatory Visit: Payer: Medicare Other | Admitting: Urology

## 2021-07-06 DIAGNOSIS — R2689 Other abnormalities of gait and mobility: Secondary | ICD-10-CM | POA: Diagnosis not present

## 2021-07-06 DIAGNOSIS — R2681 Unsteadiness on feet: Secondary | ICD-10-CM

## 2021-07-06 DIAGNOSIS — M6281 Muscle weakness (generalized): Secondary | ICD-10-CM

## 2021-07-06 DIAGNOSIS — R208 Other disturbances of skin sensation: Secondary | ICD-10-CM

## 2021-07-06 DIAGNOSIS — M25611 Stiffness of right shoulder, not elsewhere classified: Secondary | ICD-10-CM

## 2021-07-06 DIAGNOSIS — R278 Other lack of coordination: Secondary | ICD-10-CM

## 2021-07-06 DIAGNOSIS — M25621 Stiffness of right elbow, not elsewhere classified: Secondary | ICD-10-CM

## 2021-07-06 NOTE — Therapy (Signed)
OUTPATIENT PHYSICAL THERAPY TREATMENT NOTE  Patient Name: Anthony Downs MRN: [id number] DOB:[birth year], 71 y.o., male Today's Date: 07/06/2021  PCP: Redmond School, MD REFERRING PROVIDER: [name], MD       PT End of Session - 07/06/21 1147     Visit Number 23    Number of Visits 33   plus eval   Date for PT Re-Evaluation 29/92/42   Recert   Authorization Type Medicare and [place] (needs 10th visit progress note)    Progress Note Due on Visit 7    PT Start Time 1145    PT Stop Time 1230    PT Time Calculation (min) 45 min    Equipment Utilized During Treatment Gait belt    Activity Tolerance Patient tolerated treatment well    Behavior During Therapy WFL for tasks assessed/performed                        Past Medical History:  Diagnosis Date   Acid reflux    Anxiety    Arthritis    Asthma    as a child   Basal cell carcinoma 01/26/2021   RIGHT POST SURICULAR INFERIOR   Basal cell carcinoma 01/26/2021   RIGHT POST AURICULAR SUPERIOR   Benign brain tumor ([place])    Cancer ([place])    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, squamous behind ear   Complication of anesthesia    slow to wake up and pain medicines make him sick   GERD (gastroesophageal reflux disease)    Hypercholesteremia    Hypertension    PONV (postoperative nausea and vomiting)    Sleep apnea    no Cpap   Subdural hematoma, post-traumatic ([place]) 2008   fell off truck hit head on concrete   Past Surgical History:  Procedure Laterality Date   APPLICATION OF CRANIAL NAVIGATION Left 04/29/2020   Procedure: [place];  Surgeon: Judith Part, MD;  Location: [facility];  Service: Neurosurgery;  Laterality: Left;   arthroscopic knee     COLONOSCOPY N/A 11/29/2018   Procedure: COLONOSCOPY;  Surgeon: Rogene Houston, MD;  [facility];  Service: Endoscopy;  Laterality: N/A;  730-rescheduled 9/2 same time per Ann   CRANIOTOMY Left 04/29/2020    Procedure: LEFT CRANIECTOMY WITH TUMOR EXCISION;  Surgeon: Judith Part, MD;  Location: [facility];  Service: Neurosurgery;  Laterality: Left;   KNEE SURGERY Right    NASAL SEPTOPLASTY W/ TURBINOPLASTY Bilateral 03/19/2020   Procedure: NASAL SEPTOPLASTY WITH BILATERAL  TURBINATE REDUCTION;  Surgeon: Leta Baptist, MD;  Location: [facility];  Service: ENT;  Laterality: Bilateral;   VASECTOMY     Patient Active Problem List   Diagnosis Date Noted   Seizure ([place]) 08/13/2020   Seizures ([place]) 08/12/2020   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   DVT (deep venous thrombosis) ([place]) 08/12/2020   COVID-19 virus infection 08/12/2020   Acute lower UTI 07/14/2020   Delirium 07/06/2020   Sleep apnea 07/06/2020   GERD (gastroesophageal reflux disease) 07/06/2020   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[place]) 06/25/2020   Sundowning 06/25/2020   Slow transit constipation    Essential hypertension    Hypokalemia    Postoperative pain    Meningioma ([place]) 05/02/2020   Brain tumor ([place]) 04/28/2020   S/P nasal septoplasty 03/19/2020   Special screening for malignant neoplasms, colon 01/09/2018    REFERRING DIAG: D32.9 (ICD-10-CM) - Meningioma ([place]) G82.50 (ICD-10-CM) - 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[name]) R56.9 (ICD-10-CM) - Seizures ([place])  THERAPY DIAG:  Muscle weakness (generalized)  Other abnormalities of gait and mobility  Unsteadiness on feet  PERTINENT HISTORY: anxiety, OA, cancer, subdural hematoma     PRECAUTIONS: Fall  SUBJECTIVE: Pt reports having a good weekend, no new changes.   PAIN:  Are you having pain? 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 There are mild chronic small-vessel ischemic changes of the white matter. There is atrophy, encephalomalacia and gliosis along the medial aspects of both sides of the brain at the frontoparietal vertex, sequela of the previous tumor and subsequent resection. It  appears that the midportion of the superior sagittal sinus has been resected. No evidence of tumor in the venous sinuses anterior or posterior to that.  From MRI on 5/21: Redemonstrated sequela of prior vertex craniotomy/cranioplasty and parafalcine meningioma resection with partial resection of the superior sagittal sinus. No evidence of residual/recurrent tumor at the resection site.   Chronic intracranial findings without interval change, as described.   Paranasal sinus disease, as outlined.       TODAY'S TREATMENT:   NMR  From 23" mat:  -Sit <>stand x2 w/therapist sitting in front of pt in armchair to facilitate pt pushing up w/BLEs. Min A for minor hip extension but pt able to hold stand independently for 30s each rep.  -Progressed to sit <>stand x4 to bariatric RW, mirror placed in front of pt for visual biofeedback on body position. Pt required min A for boost up from mat and min tactile cues for upright posture throughout. Blocked RW w/foot for safety. Pt held each stand for 60-90s but quickly fatigued. Min A for stand <>sit for eccentric control. Mod cues throughout for proper hand placement.    PATIENT EDUCATION: Education details: practice standing marching in [place] to facilitate weight shifting, re-inflating [place] for pressure relief, repositioning every hour (tilt, push-ups, lateral lean) for pressure relief.  Person educated: Patient and Spouse Education method: Explanation, Demonstration, Tactile cues, and Verbal cues Education comprehension: verbalized understanding and needs further education   HOME EXERCISE PROGRAM: [email or web address]   ASSESSMENT:   CLINICAL IMPRESSION: Emphasis of skilled PT session on transfer training and weightbearing tolerance. Pt able to stand to a RW for the first time today from 23" mat w/min A, demonstrating good hand placement and anterior weight shifting. Pt continues to  be limited by decreased BLE and posterior chain strength, limiting his ability to maintain good posture in weightbearing. Continue POC.   OBJECTIVE IMPAIRMENTS decreased activity tolerance, decreased balance, decreased coordination, decreased endurance, decreased knowledge of use of DME, decreased mobility, difficulty walking, decreased ROM, decreased strength, hypomobility, impaired perceived functional ability, impaired flexibility, impaired UE functional use, and postural dysfunction.    ACTIVITY LIMITATIONS cleaning, community activity, meal prep, laundry, and medication management.    PERSONAL FACTORS Time since onset of injury/illness/exacerbation and 3+ comorbidities: anxiety, OA, cancer, subdural hematoma  are also affecting patient's functional outcome.      REHAB POTENTIAL: Fair due to amount of time since onset and previous progress with PT   CLINICAL DECISION MAKING: Evolving/moderate complexity   EVALUATION COMPLEXITY: Moderate     GOALS: Goals reviewed with patient? Yes     NEW SHORT TERM GOALS FOR UPDATED POC:   Target date: 06/25/2021  Pt will perform rolling R at S level and L at min level in order to indicate improved functional mobility.  Baseline:  able  to roll R at S level with use of rail at home, required CGA/min A depending on fatigue level without rail; min A to R without rail,  Goal status: IN PROGRESS  2.  Pt will perform SL>sit at min A level and sit>SL at S level in order to indicate improved functional mobility.   Baseline: L SL to sit requires mod to max A with bil LE and trunk control-05/05/21; sit > SL at S*, Supine > SL > Sit w/min A on 6/5  Goal status: MET  3.  Pt will perform slideboard transfers with mod A consistently in order to indicate improved functional mobility.   Baseline: min A on 5/31 and 6/5 Goal status: MET  4.  Pt will tolerate standing in stedy x 8 mins, with intermittent single UE support while reaching outside of BOS at mod I level  in order to indicate improved functional mobility.   Baseline: Was only able to do up to 1.30 mins today while performing simulated ADLs.  Goal status: IN PROGRESS   NEW LONG TERM GOALS FOR UPDATED POC:  Target date: 07/23/2021  Pt/caregiver will be IND with final HEP in order to indicate improved functional mobility and dec fall risk. Baseline:  Goal status: IN PROGRESS  2.  Pt will tolerate standing in // bars x 10 mins, with intermittent single UE support while reaching outside of BOS at mod I level in order to indicate improved functional mobility.   Baseline:  Goal status: INITIAL  3.  Pt will improve bilat ankle DF by 5 degrees and bilat knee extension by 6 degrees for improved body mechanics in standing and functional mobility Baseline: On 5/11: R knee ext = -8, R ankle DF= -12, L knee ext = -17, L ankle DF = -25 Goal status: INITIAL  4.  Pt will perform sit<>stand in // bars at [facility] level in order to indicate improved functional mobility.  Baseline: min-max A 2/2 fatigue Goal status: INITIAL  5.  Pt will perform lateral scoot transfers with or without slide board w/min A for improved independence with transfers Baseline: min-max A w/slide board  Goal status: INITIAL  PLAN: PT FREQUENCY: 2x/week   PT DURATION: 16 weeks (recert)   PLANNED INTERVENTIONS: Therapeutic exercises, Therapeutic activity, Neuromuscular re-education, Balance training, Gait training, Patient/Family education, Joint mobilization, Vestibular training, Orthotic/Fit training, DME instructions, and Manual therapy   PLAN FOR NEXT SESSION:   Continue gait training in // bars. Provide handout of standing marches for HEP. Practice sit <>stand in // bars with wife. Lateral weight shifting in standing, rolling using PNF principles. Lateral scooting on mat, sit <>supine transfers, add strengthening to HEP as able. In therapy, use stedy for weight shifting, reaching,  trunk dissociation, sit <>stands in // bars.  He  did so well seated in w/c facing mat, placing elbows on mat and lifting buttocks>coming to stand>weight shifting.  If we ever have +2A, I would love to try and get him prone.  He did not have a great experience with it in the past, but UEs were not positioned well. Sit > supine >sidelying and rolling practice.    Cruzita Lederer Mahonri Seiden, PT, DPT 07/06/21, 12:51 PM
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
## 2021-07-09 ENCOUNTER — Ambulatory Visit: Payer: Medicare Other | Admitting: Physical Therapy

## 2021-07-09 ENCOUNTER — Ambulatory Visit: Payer: Medicare Other | Admitting: Occupational Therapy

## 2021-07-09 ENCOUNTER — Encounter: Payer: Self-pay | Admitting: Occupational Therapy

## 2021-07-09 DIAGNOSIS — R2681 Unsteadiness on feet: Secondary | ICD-10-CM

## 2021-07-09 DIAGNOSIS — M6281 Muscle weakness (generalized): Secondary | ICD-10-CM

## 2021-07-09 DIAGNOSIS — R208 Other disturbances of skin sensation: Secondary | ICD-10-CM

## 2021-07-09 DIAGNOSIS — M25611 Stiffness of right shoulder, not elsewhere classified: Secondary | ICD-10-CM

## 2021-07-09 DIAGNOSIS — M25621 Stiffness of right elbow, not elsewhere classified: Secondary | ICD-10-CM

## 2021-07-09 DIAGNOSIS — R2689 Other abnormalities of gait and mobility: Secondary | ICD-10-CM | POA: Diagnosis not present

## 2021-07-09 DIAGNOSIS — R278 Other lack of coordination: Secondary | ICD-10-CM
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
## 2021-07-13 ENCOUNTER — Ambulatory Visit: Payer: Medicare Other | Admitting: Physical Therapy

## 2021-07-13 ENCOUNTER — Ambulatory Visit: Payer: Medicare Other | Admitting: Occupational Therapy

## 2021-07-13 DIAGNOSIS — M25611 Stiffness of right shoulder, not elsewhere classified: Secondary | ICD-10-CM

## 2021-07-13 DIAGNOSIS — R208 Other disturbances of skin sensation: Secondary | ICD-10-CM

## 2021-07-13 DIAGNOSIS — R2689 Other abnormalities of gait and mobility: Secondary | ICD-10-CM

## 2021-07-13 DIAGNOSIS — R293 Abnormal posture: Secondary | ICD-10-CM

## 2021-07-13 DIAGNOSIS — R251 Tremor, unspecified: Secondary | ICD-10-CM

## 2021-07-13 DIAGNOSIS — R278 Other lack of coordination: Secondary | ICD-10-CM

## 2021-07-13 DIAGNOSIS — R2681 Unsteadiness on feet: Secondary | ICD-10-CM

## 2021-07-13 DIAGNOSIS — M6281 Muscle weakness (generalized): Secondary | ICD-10-CM

## 2021-07-13 DIAGNOSIS — R29898 Other symptoms and signs involving the musculoskeletal system: Secondary | ICD-10-CM

## 2021-07-13 DIAGNOSIS — M25621 Stiffness of right elbow, not elsewhere classified: Secondary | ICD-10-CM

## 2021-07-16 ENCOUNTER — Ambulatory Visit: Payer: Medicare Other | Admitting: Occupational Therapy

## 2021-07-16 ENCOUNTER — Encounter: Payer: Self-pay | Admitting: Occupational Therapy

## 2021-07-16 ENCOUNTER — Ambulatory Visit: Payer: Medicare Other | Admitting: Physical Therapy

## 2021-07-16 DIAGNOSIS — R278 Other lack of coordination: Secondary | ICD-10-CM

## 2021-07-16 DIAGNOSIS — M6281 Muscle weakness (generalized): Secondary | ICD-10-CM

## 2021-07-16 DIAGNOSIS — R2689 Other abnormalities of gait and mobility: Secondary | ICD-10-CM | POA: Diagnosis not present

## 2021-07-16 DIAGNOSIS — M25621 Stiffness of right elbow, not elsewhere classified: Secondary | ICD-10-CM

## 2021-07-16 DIAGNOSIS — M25611 Stiffness of right shoulder, not elsewhere classified: Secondary | ICD-10-CM
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
## 2021-07-20 ENCOUNTER — Ambulatory Visit: Payer: Medicare Other | Attending: Internal Medicine | Admitting: Physical Therapy

## 2021-07-20 ENCOUNTER — Encounter: Payer: Self-pay | Admitting: Occupational Therapy

## 2021-07-20 ENCOUNTER — Ambulatory Visit: Payer: Medicare Other | Admitting: Occupational Therapy

## 2021-07-20 DIAGNOSIS — R278 Other lack of coordination: Secondary | ICD-10-CM | POA: Diagnosis not present

## 2021-07-20 DIAGNOSIS — M6281 Muscle weakness (generalized): Secondary | ICD-10-CM

## 2021-07-20 DIAGNOSIS — R293 Abnormal posture: Secondary | ICD-10-CM | POA: Insufficient documentation

## 2021-07-20 DIAGNOSIS — R29898 Other symptoms and signs involving the musculoskeletal system: Secondary | ICD-10-CM | POA: Diagnosis present

## 2021-07-20 DIAGNOSIS — R208 Other disturbances of skin sensation: Secondary | ICD-10-CM | POA: Insufficient documentation

## 2021-07-20 DIAGNOSIS — R2681 Unsteadiness on feet: Secondary | ICD-10-CM | POA: Diagnosis present

## 2021-07-20 DIAGNOSIS — M25611 Stiffness of right shoulder, not elsewhere classified: Secondary | ICD-10-CM

## 2021-07-20 DIAGNOSIS — R2689 Other abnormalities of gait and mobility: Secondary | ICD-10-CM

## 2021-07-20 DIAGNOSIS — R251 Tremor, unspecified: Secondary | ICD-10-CM | POA: Diagnosis present

## 2021-07-20 DIAGNOSIS — M25621 Stiffness of right elbow, not elsewhere classified: Secondary | ICD-10-CM | POA: Insufficient documentation
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
## 2021-07-20 NOTE — Therapy (Signed)
OUTPATIENT PHYSICAL THERAPY TREATMENT NOTE  Patient Name: Anthony Downs MRN: [id number] DOB:1950/04/07, 71 y.o., male Today's Date: 07/20/2021  PCP: Redmond School, MD REFERRING PROVIDER: [name], MD       PT End of Session - 07/20/21 1104     Visit Number 27    Number of Visits 33   plus eval   Date for PT Re-Evaluation 65/78/46   Recert   Authorization Type Medicare and [place] (needs 10th visit progress note)    Progress Note Due on Visit 30    PT Start Time 1103    PT Stop Time 1147    PT Time Calculation (min) 44 min    Equipment Utilized During Treatment Other (comment)   Biodex harness system   Activity Tolerance Patient tolerated treatment well    Behavior During Therapy WFL for tasks assessed/performed                           Past Medical History:  Diagnosis Date   Acid reflux    Anxiety    Arthritis    Asthma    as a child   Basal cell carcinoma 01/26/2021   RIGHT POST SURICULAR INFERIOR   Basal cell carcinoma 01/26/2021   RIGHT POST AURICULAR SUPERIOR   Benign brain tumor ([place])    Cancer ([place])    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, squamous behind ear   Complication of anesthesia    slow to wake up and pain medicines make him sick   GERD (gastroesophageal reflux disease)    Hypercholesteremia    Hypertension    PONV (postoperative nausea and vomiting)    Sleep apnea    no Cpap   Subdural hematoma, 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;  Service: Neurosurgery;  Laterality: Left;   arthroscopic knee     COLONOSCOPY N/A 11/29/2018   Procedure: COLONOSCOPY;  Surgeon: Rogene Houston, MD;  [facility];  Service: Endoscopy;  Laterality: N/A;  730-rescheduled 9/2 same time per Ann    CRANIOTOMY Left 04/29/2020   Procedure: LEFT CRANIECTOMY WITH TUMOR EXCISION;  Surgeon: Judith Part, MD;  Location: [facility];  Service: Neurosurgery;  Laterality: Left;   KNEE SURGERY Right    NASAL SEPTOPLASTY W/ TURBINOPLASTY Bilateral 03/19/2020   Procedure: NASAL SEPTOPLASTY WITH BILATERAL  TURBINATE REDUCTION;  Surgeon: Leta Baptist, MD;  Location: [facility];  Service: ENT;  Laterality: Bilateral;   VASECTOMY     Patient Active Problem List   Diagnosis Date Noted   Seizure ([place]) 08/13/2020   Seizures ([place]) 08/12/2020   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   DVT (deep venous thrombosis) ([place]) 08/12/2020   COVID-19 virus infection 08/12/2020   Acute lower UTI 07/14/2020   Delirium 07/06/2020   Sleep apnea 07/06/2020   GERD (gastroesophageal reflux disease) 07/06/2020   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[place]) 06/25/2020   Sundowning 06/25/2020   Slow transit constipation    Essential hypertension    Hypokalemia    Postoperative pain    Meningioma ([place]) 05/02/2020   Brain tumor ([place]) 04/28/2020   S/P nasal septoplasty 03/19/2020   Special screening for malignant neoplasms, colon 01/09/2018    REFERRING DIAG: D32.9 (ICD-10-CM) - Meningioma ([place]) G82.50 (ICD-10-CM) -  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[facility]) R56.9 (ICD-10-CM) - Seizures ([facility])    THERAPY DIAG:  Other lack of coordination  Other abnormalities of gait and mobility  Muscle weakness (generalized)  PERTINENT HISTORY: anxiety, OA, cancer, subdural hematoma     PRECAUTIONS: Fall  SUBJECTIVE: Pt has no new changes to report.   PAIN:  Are you having pain? 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 There are mild chronic small-vessel ischemic changes of the white matter. There is atrophy, encephalomalacia and gliosis along the medial aspects of both sides of the brain at the frontoparietal vertex, sequela of the previous tumor and subsequent  resection. It appears that the midportion of the superior sagittal sinus has been resected. No evidence of tumor in the venous sinuses anterior or posterior to that.  From MRI on 5/21: Redemonstrated sequela of prior vertex craniotomy/cranioplasty and parafalcine meningioma resection with partial resection of the superior sagittal sinus. No evidence of residual/recurrent tumor at the resection site.   Chronic intracranial findings without interval change, as described.   Paranasal sinus disease, as outlined.       TODAY'S TREATMENT:   Gait Training In Biodex body-weight support system (Used SOT harness w/leg straps due to XXL harness not fitting) and bariatric RW for improved gait kinematics, endurance and global strength:  -Pt performed multiple sit <>stands w/ RW and CGA-min A (min A x2 to get harness on) from power chair. Noted increased posterior lean today, but likely due to harness pulling pt backwards.   Gait pattern:  bilateral genu varus, step through pattern, decreased stride length, decreased hip/knee flexion- Right, decreased ankle dorsiflexion- Right, decreased ankle dorsiflexion- Left, circumduction- Right, Right foot flat, trunk flexed, wide BOS, and poor foot clearance- Right Distance walked: 5', 5' and 10' w/20 # offloaded via Best boy device utilized: Environmental consultant - 2 wheeled Level of assistance:  +4 assist: WC follow from wife, rehab tech and therapist driving Biodex and stabilizing RW, second therapist providing min-mod A to RLE to assist lateral weight shifting and swing phase  Comments: Pt demonstrated significant rotation to L side initially in attempt to circumduct RLE forward. Mod verbal and visual cues provided to facilitate lateral weight shift and pt able to initiate hip flexion of RLE on final walk w/min A to swing leg through. Pt very fatigued today and required lengthy seated rest breaks.    PATIENT EDUCATION: Education details: Practicing lateral weight  shifting at home with RW, goal assessment next session  Person educated: Patient and Spouse Education method: Explanation, Demonstration, Tactile cues, and Verbal cues Education comprehension: verbalized understanding and needs further education   HOME EXERCISE PROGRAM: [email or web address]   ASSESSMENT:   CLINICAL IMPRESSION: Emphasis of skilled PT session on gait training w/BW support. Pt ambulated a total of 20' w/Biodex and RW today, his longest distance so far. Pt continues to be limited by RLE weakness and decreased endurance but continues to progress w/gait training and transfer ability. Continue POC.  OBJECTIVE IMPAIRMENTS decreased activity tolerance, decreased balance, decreased coordination, decreased endurance, decreased knowledge of use of DME, decreased mobility, difficulty walking, decreased ROM, decreased strength, hypomobility, impaired perceived functional ability, impaired flexibility, impaired UE functional use, and postural dysfunction.    ACTIVITY LIMITATIONS cleaning, community activity, meal prep, laundry, and medication management.    PERSONAL FACTORS Time since onset of injury/illness/exacerbation and 3+ comorbidities: anxiety, OA, cancer, subdural hematoma  are  also affecting patient's functional outcome.      REHAB POTENTIAL: Fair due to amount of time since onset and previous progress with PT   CLINICAL DECISION MAKING: Evolving/moderate complexity   EVALUATION COMPLEXITY: Moderate     GOALS: Goals reviewed with patient? Yes     NEW SHORT TERM GOALS FOR UPDATED POC:   Target date: 06/25/2021  Pt will perform rolling R at S level and L at min level in order to indicate improved functional mobility.  Baseline:  able to roll R at S level with use of rail at home, required CGA/min A depending on fatigue level without rail; min A to R without rail,  Goal status: IN PROGRESS  2.  Pt will perform  SL>sit at min A level and sit>SL at S level in order to indicate improved functional mobility.   Baseline: L SL to sit requires mod to max A with bil LE and trunk control-05/05/21; sit > SL at S*, Supine > SL > Sit w/min A on 6/5  Goal status: MET  3.  Pt will perform slideboard transfers with mod A consistently in order to indicate improved functional mobility.   Baseline: min A on 5/31 and 6/5 Goal status: MET  4.  Pt will tolerate standing in stedy x 8 mins, with intermittent single UE support while reaching outside of BOS at mod I level in order to indicate improved functional mobility.   Baseline: Was only able to do up to 1.30 mins today while performing simulated ADLs.  Goal status: IN PROGRESS   NEW LONG TERM GOALS FOR UPDATED POC:  Target date: 07/23/2021  Pt/caregiver will be IND with final HEP in order to indicate improved functional mobility and dec fall risk. Baseline:  Goal status: IN PROGRESS  2.  Pt will tolerate standing in // bars x 10 mins, with intermittent single UE support while reaching outside of BOS at mod I level in order to indicate improved functional mobility.   Baseline:  Goal status: INITIAL  3.  Pt will improve bilat ankle DF by 5 degrees and bilat knee extension by 6 degrees for improved body mechanics in standing and functional mobility Baseline: On 5/11: R knee ext = -8, R ankle DF= -12, L knee ext = -17, L ankle DF = -25 Goal status: INITIAL  4.  Pt will perform sit<>stand in // bars at [facility] level in order to indicate improved functional mobility.  Baseline: min-max A 2/2 fatigue Goal status: MET  5.  Pt will perform lateral scoot transfers with or without slide board w/min A for improved independence with transfers Baseline: min-max A w/slide board  Goal status: INITIAL  PLAN: PT FREQUENCY: 2x/week   PT DURATION: 16 weeks (recert)   PLANNED INTERVENTIONS: Therapeutic exercises, Therapeutic activity, Neuromuscular re-education, Balance training,  Gait training, Patient/Family education, Joint mobilization, Vestibular training, Orthotic/Fit training, DME instructions, and Manual therapy   PLAN FOR NEXT SESSION:  goal assessment, Body-weight support gait training, Continue gait training in // bars. Provide handout of standing marches for HEP. Practice sit <>stand in // bars with wife. Lateral weight shifting in standing, rolling using PNF principles. Lateral scooting on mat, sit <>supine transfers, add strengthening to HEP as able. In therapy, use stedy for weight shifting, reaching,  trunk dissociation, sit <>stands in // bars.  He did so well seated in w/c facing mat, placing elbows on mat and lifting buttocks>coming to stand>weight shifting.  If we ever have +2A, I would love to try  and get him prone.  He did not have a great experience with it in the past, but UEs were not positioned well. Sit > supine >sidelying and rolling practice.    Cruzita Lederer Baeleigh Devincent, PT, DPT 07/20/21, 12:14 PM

## 2021-07-23 ENCOUNTER — Ambulatory Visit: Payer: Medicare Other | Admitting: Physical Therapy

## 2021-07-23 ENCOUNTER — Encounter: Payer: Medicare Other | Admitting: Occupational Therapy

## 2021-07-23 DIAGNOSIS — R2681 Unsteadiness on feet: Secondary | ICD-10-CM

## 2021-07-23 DIAGNOSIS — R278 Other lack of coordination: Secondary | ICD-10-CM | POA: Diagnosis not present

## 2021-07-23 DIAGNOSIS — R2689 Other abnormalities of gait and mobility: Secondary | ICD-10-CM

## 2021-07-23 DIAGNOSIS — M6281 Muscle weakness (generalized): Secondary | ICD-10-CM

## 2021-07-23 NOTE — Therapy (Signed)
OUTPATIENT PHYSICAL THERAPY TREATMENT NOTE  Patient Name: Anthony Downs MRN: [id number] DOB:04/09/1950, 71 y.o., male Today's Date: 07/23/2021  PCP: Redmond School, MD REFERRING PROVIDER: [name], MD       PT End of Session - 07/23/21 1233     Visit Number 28    Number of Visits 33   plus eval   Date for PT Re-Evaluation 85/46/27   Recert   Authorization Type Medicare and [place] (needs 10th visit progress note)    Progress Note Due on Visit 58    PT Start Time 1230    PT Stop Time 1317    PT Time Calculation (min) 47 min    Equipment Utilized During Treatment Gait belt    Activity Tolerance Patient tolerated treatment well    Behavior During Therapy WFL for tasks assessed/performed                            Past Medical History:  Diagnosis Date   Acid reflux    Anxiety    Arthritis    Asthma    as a child   Basal cell carcinoma 01/26/2021   RIGHT POST SURICULAR INFERIOR   Basal cell carcinoma 01/26/2021   RIGHT POST AURICULAR SUPERIOR   Benign brain tumor ([place])    Cancer ([place])    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, squamous behind ear   Complication of anesthesia    slow to wake up and pain medicines make him sick   GERD (gastroesophageal reflux disease)    Hypercholesteremia    Hypertension    PONV (postoperative nausea and vomiting)    Sleep apnea    no Cpap   Subdural hematoma, 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   fell off truck hit head on concrete   Past Surgical History:  Procedure Laterality Date   APPLICATION OF CRANIAL NAVIGATION Left 04/29/2020   Procedure: [place];  Surgeon: Judith Part, MD;  Location: [place];  Service: Neurosurgery;  Laterality: Left;   arthroscopic knee     COLONOSCOPY N/A 11/29/2018   Procedure: COLONOSCOPY;  Surgeon: Rogene Houston, MD;  [facility];  Service: Endoscopy;  Laterality: N/A;  730-rescheduled 9/2 same time per Ann   CRANIOTOMY Left 04/29/2020    Procedure: LEFT CRANIECTOMY WITH TUMOR EXCISION;  Surgeon: Judith Part, MD;  Location: [place];  Service: Neurosurgery;  Laterality: Left;   KNEE SURGERY Right    NASAL SEPTOPLASTY W/ TURBINOPLASTY Bilateral 03/19/2020   Procedure: NASAL SEPTOPLASTY WITH BILATERAL  TURBINATE REDUCTION;  Surgeon: Leta Baptist, MD;  Location: [place];  Service: ENT;  Laterality: Bilateral;   VASECTOMY     Patient Active Problem List   Diagnosis Date Noted   Seizure ([place]) 08/13/2020   Seizures ([place]) 08/12/2020   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   DVT (deep venous thrombosis) ([place]) 08/12/2020   COVID-19 virus infection 08/12/2020   Acute lower UTI 07/14/2020   Delirium 07/06/2020   Sleep apnea 07/06/2020   GERD (gastroesophageal reflux disease) 07/06/2020   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[place]) 06/25/2020   Sundowning 06/25/2020   Slow transit constipation    Essential hypertension    Hypokalemia    Postoperative pain    Meningioma ([place]) 05/02/2020   Brain tumor ([place]) 04/28/2020   S/P nasal septoplasty 03/19/2020   Special screening for malignant neoplasms, colon 01/09/2018    REFERRING DIAG: D32.9 (ICD-10-CM) - Meningioma ([place]) G82.50 (ICD-10-CM) - 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[place]) R56.9 (  ICD-10-CM) - Seizures ([place])    THERAPY DIAG:  Muscle weakness (generalized)  Unsteadiness on feet  Other abnormalities of gait and mobility  PERTINENT HISTORY: anxiety, OA, cancer, subdural hematoma     PRECAUTIONS: Fall  SUBJECTIVE: No new changes, "I am ready to stand"   PAIN:  Are you having pain? 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 There are mild chronic small-vessel ischemic changes of the white matter. There is atrophy, encephalomalacia and gliosis along the medial aspects of both sides of the brain at the frontoparietal vertex, sequela of the previous tumor and subsequent resection. It appears that the  midportion of the superior sagittal sinus has been resected. No evidence of tumor in the venous sinuses anterior or posterior to that.  From MRI on 5/21: Redemonstrated sequela of prior vertex craniotomy/cranioplasty and parafalcine meningioma resection with partial resection of the superior sagittal sinus. No evidence of residual/recurrent tumor at the resection site.   Chronic intracranial findings without interval change, as described.   Paranasal sinus disease, as outlined.       TODAY'S TREATMENT:   NMR   LTG Assessment   In // bars:  -Sit <>stand w/min A due to power chair seat being reclined via pulling up on bars  -Pt tolerated static standing for 7:47s w/BUE support on rails prior to needing to sit and rest  -Lateral scoot transfer from power chair to low mat on L side w/min A for RLE management only.   AROM/PROM:  tested in seated position  PROM Right 07/23/2021 Left 07/23/2021  Knee extension -10 -15    -Sit <> stand pivot from elevated mat table to WC w/RW and mod-max A x2 for RLE management, postural control, RW management and lateral weight shift. Pt able to move LLE but continues to have difficulty shifting weight to L side to move RLE. Mod A for stand <>sit due to poor eccentric control.    PATIENT EDUCATION: Education details: Goal assessment and new LTGs for extended POC  Person educated: Patient and Spouse Education method: Explanation, Demonstration, Tactile cues, and Verbal cues Education comprehension: verbalized understanding and needs further education   HOME EXERCISE PROGRAM: [email or web address]   ASSESSMENT:   CLINICAL IMPRESSION: Emphasis of skilled PT session on LTG assessment. Pt has met 2/5 LTGs, demonstrating ability to perform sit <>stands in // bars w/CGA and lateral scoot transfers w/min A. Pt did not meet his ROM goal and demonstrated no change from previous assessment. Pt  able to hold static stand in // bars for over 7 minutes, slimly missing his goal of 10 minutes. Pt continues to progress towards goals, new LTGs added to reflect progress and extended POC.   OBJECTIVE IMPAIRMENTS decreased activity tolerance, decreased balance, decreased coordination, decreased endurance, decreased knowledge of use of DME, decreased mobility, difficulty walking, decreased ROM, decreased strength, hypomobility, impaired perceived functional ability, impaired flexibility, impaired UE functional use, and postural dysfunction.    ACTIVITY LIMITATIONS cleaning, community activity, meal prep, laundry, and medication management.    PERSONAL FACTORS Time since onset of injury/illness/exacerbation and 3+ comorbidities: anxiety, OA, cancer, subdural hematoma  are also affecting patient's functional outcome.      REHAB POTENTIAL: Fair due to amount of time since onset and previous progress with PT   CLINICAL DECISION MAKING: Evolving/moderate complexity   EVALUATION COMPLEXITY: Moderate     GOALS: Goals reviewed with patient?  Yes  OLD LONG TERM GOALS FOR UPDATED POC:  Target date: 07/23/2021  Pt/caregiver will be IND with final HEP in order to indicate improved functional mobility and dec fall risk. Baseline:  Goal status: IN PROGRESS  2.  Pt will tolerate standing in // bars x 10 mins, with intermittent single UE support while reaching outside of BOS at mod I level in order to indicate improved functional mobility.   Baseline: 7:47 on 07/23/21 Goal status: IN PROGRESS  3.  Pt will improve bilat ankle DF by 5 degrees and bilat knee extension by 6 degrees for improved body mechanics in standing and functional mobility Baseline: On 5/11: R knee ext = -8, R ankle DF= -12, L knee ext = -17, L ankle DF = -25 Goal status: NOT MET  4.  Pt will perform sit<>stand in // bars at [facility] level in order to indicate improved functional mobility.  Baseline: min-max A 2/2 fatigue Goal status:  MET  5.  Pt will perform lateral scoot transfers with or without slide board w/min A for improved independence with transfers Baseline: min-max A w/slide board  Goal status: INITIAL  NEW LONG TERM GOALS FOR EXTENDED POC:  Target date: 08/20/2021  Pt/caregiver will be IND with final HEP in order to indicate improved functional mobility and dec fall risk. Baseline:  Goal status: IN PROGRESS  2.  Pt will perform sit <>stand from 20" mat table to bariatric RW w/Min A for improved BLE strength and transfers  Baseline: Min A from 23" mat  Goal status: INITIAL  3.  Pt will ambulate 20' w/RW and body weight support as necessary w/mod A of single therapist for improved weightbearing tolerance and global strength  Baseline: 10' w/BW support and mod A x2  Goal status: INITIAL  PLAN: PT FREQUENCY: 2x/week   PT DURATION: 16 weeks (recert)   PLANNED INTERVENTIONS: Therapeutic exercises, Therapeutic activity, Neuromuscular re-education, Balance training, Gait training, Patient/Family education, Joint mobilization, Vestibular training, Orthotic/Fit training, DME instructions, and Manual therapy   PLAN FOR NEXT SESSION: Body-weight support gait training, Continue gait training in // bars. Provide handout of standing marches for HEP. Practice sit <>stand in // bars with wife. Lateral weight shifting in standing, rolling using PNF principles. Lateral scooting on mat, sit <>supine transfers, add strengthening to HEP as able. In therapy, use stedy for weight shifting, reaching,  trunk dissociation, sit <>stands in // bars.  He did so well seated in w/c facing mat, placing elbows on mat and lifting buttocks>coming to stand>weight shifting.  If we ever have +2A, I would love to try and get him prone.  He did not have a great experience with it in the past, but UEs were not positioned well. Sit > supine >sidelying and rolling practice.    Cruzita Lederer Danniell Rotundo, PT, DPT 07/23/21, 1:24 PM

## 2021-07-27 ENCOUNTER — Encounter: Payer: Self-pay | Admitting: Occupational Therapy

## 2021-07-27 ENCOUNTER — Ambulatory Visit: Payer: Medicare Other | Admitting: Occupational Therapy

## 2021-07-27 ENCOUNTER — Ambulatory Visit: Payer: Medicare Other | Admitting: Physical Therapy

## 2021-07-27 DIAGNOSIS — M6281 Muscle weakness (generalized): Secondary | ICD-10-CM

## 2021-07-27 DIAGNOSIS — R2689 Other abnormalities of gait and mobility: Secondary | ICD-10-CM

## 2021-07-27 DIAGNOSIS — R278 Other lack of coordination: Secondary | ICD-10-CM

## 2021-07-27 DIAGNOSIS — R208 Other disturbances of skin sensation: Secondary | ICD-10-CM

## 2021-07-27 DIAGNOSIS — R2681 Unsteadiness on feet: Secondary | ICD-10-CM

## 2021-07-27 DIAGNOSIS — M25611 Stiffness of right shoulder, not elsewhere classified: Secondary | ICD-10-CM

## 2021-07-27 DIAGNOSIS — M25621 Stiffness of right elbow, not elsewhere classified: Secondary | ICD-10-CM

## 2021-07-27 NOTE — Therapy (Signed)
OUTPATIENT PHYSICAL THERAPY TREATMENT NOTE  Patient Name: Anthony Downs MRN: [id number] DOB:[birth year], 71 y.o., male Today's Date: 07/27/2021  PCP: Redmond School, MD REFERRING PROVIDER: [name], MD       PT End of Session - 07/27/21 1102     Visit Number 29    Number of Visits 33   plus eval   Date for PT Re-Evaluation 40/98/11   Recert   Authorization Type Medicare and [place] (needs 10th visit progress note)    Progress Note Due on Visit 52    PT Start Time 1100    PT Stop Time 1145    PT Time Calculation (min) 45 min    Equipment Utilized During Treatment Other (comment)   SOT harness and body weight support system   Activity Tolerance Patient limited by fatigue    Behavior During Therapy [facility] for tasks assessed/performed                             Past Medical History:  Diagnosis Date   Acid reflux    Anxiety    Arthritis    Asthma    as a child   Basal cell carcinoma 01/26/2021   RIGHT POST SURICULAR INFERIOR   Basal cell carcinoma 01/26/2021   RIGHT POST AURICULAR SUPERIOR   Benign brain tumor ([place])    Cancer ([place])    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, squamous behind ear   Complication of anesthesia    slow to wake up and pain medicines make him sick   GERD (gastroesophageal reflux disease)    Hypercholesteremia    Hypertension    PONV (postoperative nausea and vomiting)    Sleep apnea    no Cpap   Subdural hematoma, 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   fell off truck hit head on concrete   Past Surgical History:  Procedure Laterality Date   APPLICATION OF CRANIAL NAVIGATION Left 04/29/2020   Procedure: [place];  Surgeon: Judith Part, MD;  Location: [facility];  Service: Neurosurgery;  Laterality: Left;   arthroscopic knee     COLONOSCOPY N/A 11/29/2018   Procedure: COLONOSCOPY;  Surgeon: Rogene Houston, MD;  [facility];  Service: Endoscopy;  Laterality: N/A;  730-rescheduled 9/2  same time per Ann   CRANIOTOMY Left 04/29/2020   Procedure: LEFT CRANIECTOMY WITH TUMOR EXCISION;  Surgeon: Judith Part, MD;  Location: [place];  Service: Neurosurgery;  Laterality: Left;   KNEE SURGERY Right    NASAL SEPTOPLASTY W/ TURBINOPLASTY Bilateral 03/19/2020   Procedure: NASAL SEPTOPLASTY WITH BILATERAL  TURBINATE REDUCTION;  Surgeon: Leta Baptist, MD;  Location: [facility];  Service: ENT;  Laterality: Bilateral;   VASECTOMY     Patient Active Problem List   Diagnosis Date Noted   Seizure ([place]) 08/13/2020   Seizures ([place]) 08/12/2020   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   DVT (deep venous thrombosis) ([place]) 08/12/2020   COVID-19 virus infection 08/12/2020   Acute lower UTI 07/14/2020   Delirium 07/06/2020   Sleep apnea 07/06/2020   GERD (gastroesophageal reflux disease) 07/06/2020   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[place]) 06/25/2020   Sundowning 06/25/2020   Slow transit constipation    Essential hypertension    Hypokalemia    Postoperative pain    Meningioma ([place]) 05/02/2020   Brain tumor ([place]) 04/28/2020   S/P nasal septoplasty 03/19/2020   Special screening for malignant neoplasms, colon 01/09/2018    REFERRING DIAG: D32.9 (ICD-10-CM) -  Meningioma ([facility]) G82.50 (ICD-10-CM) - 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[place]) R56.9 (ICD-10-CM) - Seizures ([place])    THERAPY DIAG:  Other abnormalities of gait and mobility  Unsteadiness on feet  Muscle weakness (generalized)  PERTINENT HISTORY: anxiety, OA, cancer, subdural hematoma     PRECAUTIONS: Fall  SUBJECTIVE: Pt reports having a good weekend. Per wife, pt did not stand much the past few days.   PAIN:  Are you having pain? 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 There are mild chronic small-vessel ischemic changes of the white matter. There is atrophy, encephalomalacia and gliosis along the medial aspects of both sides of the brain at the  frontoparietal vertex, sequela of the previous tumor and subsequent resection. It appears that the midportion of the superior sagittal sinus has been resected. No evidence of tumor in the venous sinuses anterior or posterior to that.  From MRI on 5/21: Redemonstrated sequela of prior vertex craniotomy/cranioplasty and parafalcine meningioma resection with partial resection of the superior sagittal sinus. No evidence of residual/recurrent tumor at the resection site.   Chronic intracranial findings without interval change, as described.   Paranasal sinus disease, as outlined.       TODAY'S TREATMENT:   Gait Training/Self-care/home management  Using SOT harness (size L) and [employer] bodyweight support system (set on 0-30#), pt performed 5 sit <>stands w/mod-max A due to retropulsion and poor BLE extension in stance. Mod verbal and tactile cues for upright posture, but pt unable to correct form. Attempted gait training x2 but pt unable to maintain bilateral knee extension and required max-total A to progress RLE and facilitate lateral weight shifting.   Lengthy discussion regarding importance of pt practicing standing tolerance in upright position (he frequently leans forward onto stedy or table rather than standing upright) for posterior chain strengthening and stamina. Pt verbalized understanding. Currently recommending pt stand for at least 2 minutes every 2 hours to build tolerance.      PATIENT EDUCATION: Education details: See above  Person educated: Patient and Spouse Education method: Explanation, Demonstration, Tactile cues, and Verbal cues Education comprehension: verbalized understanding and needs further education   HOME EXERCISE PROGRAM: [email or web address]   ASSESSMENT:   CLINICAL IMPRESSION: Emphasis of skilled PT session on gait training, weightbearing tolerance and education. Pt very fatigued today and  unable to maintain upright weightbearing even with harness. Per wife, pt has not been practicing standing at home. Lengthy discussion on importance of practicing proper standing form at home for increased stamina and facilitation of gait training. Continue POC.   OBJECTIVE IMPAIRMENTS decreased activity tolerance, decreased balance, decreased coordination, decreased endurance, decreased knowledge of use of DME, decreased mobility, difficulty walking, decreased ROM, decreased strength, hypomobility, impaired perceived functional ability, impaired flexibility, impaired UE functional use, and postural dysfunction.    ACTIVITY LIMITATIONS cleaning, community activity, meal prep, laundry, and medication management.    PERSONAL FACTORS Time since onset of injury/illness/exacerbation and 3+ comorbidities: anxiety, OA, cancer, subdural hematoma  are also affecting patient's functional outcome.      REHAB POTENTIAL: Fair due to amount of time since onset and previous progress with PT   CLINICAL DECISION MAKING: Evolving/moderate complexity   EVALUATION COMPLEXITY: Moderate     GOALS: Goals reviewed with patient? Yes  OLD LONG TERM GOALS FOR UPDATED POC:  Target date: 07/23/2021  Pt/caregiver will be IND with final HEP in order to indicate improved functional mobility  and dec fall risk. Baseline:  Goal status: IN PROGRESS  2.  Pt will tolerate standing in // bars x 10 mins, with intermittent single UE support while reaching outside of BOS at mod I level in order to indicate improved functional mobility.   Baseline: 7:47 on 07/23/21 Goal status: IN PROGRESS  3.  Pt will improve bilat ankle DF by 5 degrees and bilat knee extension by 6 degrees for improved body mechanics in standing and functional mobility Baseline: On 5/11: R knee ext = -8, R ankle DF= -12, L knee ext = -17, L ankle DF = -25 Goal status: NOT MET  4.  Pt will perform sit<>stand in // bars at [facility] level in order to indicate improved  functional mobility.  Baseline: min-max A 2/2 fatigue Goal status: MET  5.  Pt will perform lateral scoot transfers with or without slide board w/min A for improved independence with transfers Baseline: min-max A w/slide board  Goal status: INITIAL  NEW LONG TERM GOALS FOR EXTENDED POC:  Target date: 08/20/2021  Pt/caregiver will be IND with final HEP in order to indicate improved functional mobility and dec fall risk. Baseline:  Goal status: IN PROGRESS  2.  Pt will perform sit <>stand from 20" mat table to bariatric RW w/Min A for improved BLE strength and transfers  Baseline: Min A from 23" mat  Goal status: INITIAL  3.  Pt will ambulate 20' w/RW and body weight support as necessary w/mod A of single therapist for improved weightbearing tolerance and global strength  Baseline: 10' w/BW support and mod A x2  Goal status: INITIAL  PLAN: PT FREQUENCY: 2x/week   PT DURATION: 16 weeks (recert)   PLANNED INTERVENTIONS: Therapeutic exercises, Therapeutic activity, Neuromuscular re-education, Balance training, Gait training, Patient/Family education, Joint mobilization, Vestibular training, Orthotic/Fit training, DME instructions, and Manual therapy   PLAN FOR NEXT SESSION: 30th visit PN, Body-weight support gait training, Continue gait training in // bars. Provide handout of standing marches for HEP. Practice sit <>stand in // bars with wife. Lateral weight shifting in standing, rolling using PNF principles. Lateral scooting on mat, sit <>supine transfers, add strengthening to HEP as able. In therapy, use stedy for weight shifting, reaching,  trunk dissociation, sit <>stands in // bars.  He did so well seated in w/c facing mat, placing elbows on mat and lifting buttocks>coming to stand>weight shifting.  If we ever have +2A, I would love to try and get him prone.  He did not have a great experience with it in the past, but UEs were not positioned well. Sit > supine >sidelying and rolling  practice.    Cruzita Lederer Gualberto Wahlen, PT, DPT 07/27/21, 12:05 PM

## 2021-07-27 NOTE — Therapy (Signed)
OUTPATIENT OCCUPATIONAL THERAPY TREATMENT   Patient Name: Anthony Downs MRN: [id number] DOB:15-Dec-1950, 71 y.o., male Today's Date: 07/27/2021  PCP: Redmond School, MD REFERRING PROVIDER: Alger Simons MD  END OF SESSION:   OT End of Session - 07/27/21 1152     Visit Number 29    Number of Visits 41    Date for OT Re-Evaluation 08/20/21    Authorization Type Medicare and UHC    Authorization Time Period UHC VL - Need medical auth after 30 visits    OT Start Time 1150    OT Stop Time 1230    OT Time Calculation (min) 40 min    Activity Tolerance Patient tolerated treatment well    Behavior During Therapy WFL for tasks assessed/performed                ONSET DATE: 04/28/20  REFERRING DIAG: Meningioma, Tetraplegia  THERAPY DIAG:  Other abnormalities of gait and mobility  Other disturbances of skin sensation  Unsteadiness on feet  Muscle weakness (generalized)  Other lack of coordination  Stiffness of right elbow, not elsewhere classified  Stiffness of right shoulder, not elsewhere classified     Past Medical History:  Diagnosis Date   Acid reflux    Anxiety    Arthritis    Asthma    as a child   Basal cell carcinoma 01/26/2021   RIGHT POST SURICULAR INFERIOR   Basal cell carcinoma 01/26/2021   RIGHT POST AURICULAR SUPERIOR   Benign brain tumor ([place])    Cancer ([place])    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, squamous behind ear   Complication of anesthesia    slow to wake up and pain medicines make him sick   GERD (gastroesophageal reflux disease)    Hypercholesteremia    Hypertension    PONV (postoperative nausea and vomiting)    Sleep apnea    no Cpap   Subdural hematoma, 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   fell off truck hit head on concrete   Past Surgical History:  Procedure Laterality Date   APPLICATION OF CRANIAL NAVIGATION Left 04/29/2020   Procedure: [place];  Surgeon: Judith Part, MD;  Location: [facility];   Service: Neurosurgery;  Laterality: Left;   arthroscopic knee     COLONOSCOPY N/A 11/29/2018   Procedure: COLONOSCOPY;  Surgeon: Rogene Houston, MD;  [facility];  Service: Endoscopy;  Laterality: N/A;  730-rescheduled 9/2 same time per Ann   CRANIOTOMY Left 04/29/2020   Procedure: LEFT CRANIECTOMY WITH TUMOR EXCISION;  Surgeon: Judith Part, MD;  Location: [facility];  Service: Neurosurgery;  Laterality: Left;   KNEE SURGERY Right    NASAL SEPTOPLASTY W/ TURBINOPLASTY Bilateral 03/19/2020   Procedure: NASAL SEPTOPLASTY WITH BILATERAL  TURBINATE REDUCTION;  Surgeon: Leta Baptist, MD;  Location: [facility];  Service: ENT;  Laterality: Bilateral;   VASECTOMY     Patient Active Problem List   Diagnosis Date Noted   Seizure ([place]) 08/13/2020   Seizures ([place]) 08/12/2020   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   DVT (deep venous thrombosis) ([place]) 08/12/2020   COVID-19 virus infection 08/12/2020   Acute lower UTI 07/14/2020   Delirium 07/06/2020   Sleep apnea 07/06/2020   GERD (gastroesophageal reflux disease) 07/06/2020   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[place]) 06/25/2020   Sundowning 06/25/2020   Slow transit constipation    Essential hypertension    Hypokalemia    Postoperative pain    Meningioma ([place]) 05/02/2020   Brain tumor ([place]) 04/28/2020  S/P nasal septoplasty 03/19/2020   Special screening for malignant neoplasms, colon 01/09/2018    ONSET DATE: 04/28/20  REFERRING DIAG: Meningioma, Tetraplegia  PERTINENT HISTORY: traumatic SDH 2008  PRECAUTIONS: Fall Risk  SUBJECTIVE: Pt denies any pain today  PAIN:  Are you having pain? No    TODAY'S TREATMENT:  07/27/21   Gross motor planning with tossing ball up and bouncing on table with BUE  [employer] chips nd placing into frame/board with RUE with min difficulty. Pt then worked on [employer] in [employer] of 3 with min difficulty. Improved coordination noted today and with RUE and decreased tremors  GOALS: Goals reviewed with  patient? Yes   SHORT TERM GOALS: Target date: 06/26/2021    1.  Patient and caregiver will complete HEP designed to improve passive and active range of motion in BUE  Baseline:  Goal status: ACHIEVED   2.  Patient will utilize RUE to assist with at least one ADL task with mod cueing and minimal assistance  Baseline:  Goal status: ACHIEVED   3.  Patient will reach at least mid calf toward feet with BUE in preparation for participating in LB dressing  Baseline:  Goal status: ACHIEVED   4. Patient will demonstrate sufficient lateral lean left or right in seated position to prepare for placement of sliding board to encourage more active transfers and increase potential for commode transfers.                          Baseline:                          Goal status: ACHIEVED  5. Pt will be educated on available AE and DME to assist with ADL completion as needed for improved independence.   Baseline:  Goal status: ACHIEVED  6. Patient will demonstrate improved coordination in RUE as evidenced by at least 15 blocks (box in blocks) in one minute   Baseline: 9 blocks at renewal 05/28/21  Goal status: INITIAL - CONTINUE GOAL      LONG TERM GOALS: Target date: 08/20/2021   1.  Patient will complete updated HEP designed to improve range, strength, and functional use of BUE  Baseline:  Goal status: ONGOING   2.  Patient will complete a level surface transfer with slide board PRN with no more than mod assistance in preparation for commode transfer  Baseline:  Goal status: REVISED/ONGOING currently inconsistent with SB transfers   3.  Patient will demonstrate improved coordination in RUE as evidenced by at least 8 blocks (box in blocks) in one minute  Baseline: 4 blocks Goal status: ACHIEVED 9 blocks   4.  Patient will demonstrate a 4lb increase in grip strength in RUE  Baseline: 6.1  Goal status: Achieved 5/31 - 12.9lb    5. Patient will complete at least 30% more of bathing and dressing  tasks   Baseline:  Goal status: ACHIEVED  per pt wife-60-70%  6. Pt will assist with clothing management and hygiene with unilateral support with min A with toileting.  Baseline:  Goal Status: INITIAL - 5/31 - assisting with clothing but not hygiene   7. Pt will increase range of motion in AROM in RUE shoulder flexion by at least 10 degrees for increasing ability to use right, dominant, hand, for more functional reaching tasks and ADLs.  Baseline: 70 degrees  Goal Status: INITIAL  Plan     Clinical Impression Statement 07/27/21 Pt continues to progress and demonstrating improved fine motor coordination and decrease in tremors in RUE.   OT Occupational Profile and History Detailed Assessment- Review of Records and additional review of physical, cognitive, psychosocial history related to current functional performance    Occupational performance deficits (Please refer to evaluation for details): ADL's;IADL's;Leisure    Body Structure / Function / Physical Skills ADL;Decreased knowledge of use of DME;Gait;Obesity;Strength;Tone;GMC;Dexterity;Balance;Body mechanics;Edema;Proprioception;UE functional use;Cardiopulmonary status limiting activity;Endurance;IADL;ROM;Continence;Coordination;Flexibility;Mobility;Sensation;FMC;Decreased knowledge of precautions;Muscle spasms    Rehab Potential Good    Clinical Decision Making Several treatment options, min-mod task modification necessary    Comorbidities Affecting Occupational Performance: May have comorbidities impacting occupational performance    Modification or Assistance to Complete Evaluation  Min-Moderate modification of tasks or assist with assess necessary to complete eval    OT Frequency 2x / week    OT Duration 12 weeks    OT Treatment/Interventions Self-care/ADL training;Aquatic Therapy;DME and/or AE instruction;Splinting;Balance training;Therapeutic activities;Therapeutic exercise;Cognitive remediation/compensation;Passive range of  motion;Functional Mobility Training;Neuromuscular education;Electrical Stimulation;Energy conservation;Patient/family education;Manual Therapy    Plan continue towards increased mobility/transfers for increased independence and ability to complete ADLs   Consulted and Agree with Plan of Care Patient;Family member/caregiver    Family Member Consulted wife             Zachery Conch, [place] 07/27/2021, 1:12 PM

## 2021-07-30 ENCOUNTER — Ambulatory Visit: Payer: Medicare Other | Admitting: Physical Therapy

## 2021-07-30 ENCOUNTER — Ambulatory Visit: Payer: Medicare Other | Admitting: Occupational Therapy

## 2021-07-30 DIAGNOSIS — M6281 Muscle weakness (generalized): Secondary | ICD-10-CM

## 2021-07-30 DIAGNOSIS — R208 Other disturbances of skin sensation: Secondary | ICD-10-CM

## 2021-07-30 DIAGNOSIS — R278 Other lack of coordination: Secondary | ICD-10-CM

## 2021-07-30 DIAGNOSIS — R2681 Unsteadiness on feet: Secondary | ICD-10-CM

## 2021-07-30 DIAGNOSIS — M25621 Stiffness of right elbow, not elsewhere classified: Secondary | ICD-10-CM

## 2021-07-30 DIAGNOSIS — R2689 Other abnormalities of gait and mobility: Secondary | ICD-10-CM

## 2021-07-30 DIAGNOSIS — M25611 Stiffness of right shoulder, not elsewhere classified: Secondary | ICD-10-CM

## 2021-07-30 NOTE — Therapy (Signed)
OUTPATIENT PHYSICAL THERAPY TREATMENT NOTE - 30TH VISIT PROGRESS NOTE  Patient Name: Anthony Downs MRN: [id number] DOB:Mar 31, 1950, 71 y.o., male Today's Date: 07/30/2021  PCP: Anthony School, MD REFERRING PROVIDER: [name], MD   Physical Therapy Progress Note   Dates of Reporting Period:04/01/21 - 07/30/21  See Note below for Objective Data and Assessment of Progress/Goals.  Thank you for the referral of this patient. Anthony Downs, PT, DPT       PT End of Session - 07/30/21 1318     Visit Number 30    Number of Visits 33   plus eval   Date for PT Re-Evaluation 15/61/53   Recert   Authorization Type Medicare and [place] (needs 10th visit progress note)    Progress Note Due on Visit 30    PT Start Time 1316    PT Stop Time 1359    PT Time Calculation (min) 43 min    Equipment Utilized During Treatment Gait belt    Activity Tolerance Patient tolerated treatment well    Behavior During Therapy WFL for tasks assessed/performed                              Past Medical History:  Diagnosis Date   Acid reflux    Anxiety    Arthritis    Asthma    as a child   Basal cell carcinoma 01/26/2021   RIGHT POST SURICULAR INFERIOR   Basal cell carcinoma 01/26/2021   RIGHT POST AURICULAR SUPERIOR   Benign brain tumor ([place])    Cancer ([place])    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, squamous behind ear   Complication of anesthesia    slow to wake up and pain medicines make him sick   GERD (gastroesophageal reflux disease)    Hypercholesteremia    Hypertension    PONV (postoperative nausea and vomiting)    Sleep apnea    no Cpap   Subdural hematoma, 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   fell off truck hit head on concrete   Past Surgical History:  Procedure Laterality Date   APPLICATION OF CRANIAL NAVIGATION Left 04/29/2020   Procedure: [place];  Surgeon: Anthony Part, MD;  Location: [facility];  Service: Neurosurgery;   Laterality: Left;   arthroscopic knee     COLONOSCOPY N/A 11/29/2018   Procedure: COLONOSCOPY;  Surgeon: Anthony Houston, MD;  [facility];  Service: Endoscopy;  Laterality: N/A;  730-rescheduled 9/2 same time per Ann   CRANIOTOMY Left 04/29/2020   Procedure: LEFT CRANIECTOMY WITH TUMOR EXCISION;  Surgeon: Anthony Part, MD;  Location: [facility];  Service: Neurosurgery;  Laterality: Left;   KNEE SURGERY Right    NASAL SEPTOPLASTY W/ TURBINOPLASTY Bilateral 03/19/2020   Procedure: NASAL SEPTOPLASTY WITH BILATERAL  TURBINATE REDUCTION;  Surgeon: Anthony Baptist, MD;  Location: [facility];  Service: ENT;  Laterality: Bilateral;   VASECTOMY     Patient Active Problem List   Diagnosis Date Noted   Seizure ([place]) 08/13/2020   Seizures ([place]) 08/12/2020   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   DVT (deep venous thrombosis) ([place]) 08/12/2020   COVID-19 virus infection 08/12/2020   Acute lower UTI 07/14/2020   Delirium 07/06/2020   Sleep apnea 07/06/2020   GERD (gastroesophageal reflux disease) 07/06/2020   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[place]) 06/25/2020   Sundowning 06/25/2020   Slow transit constipation    Essential hypertension    Hypokalemia  Postoperative pain    Meningioma ([place]) 05/02/2020   Brain tumor ([place]) 04/28/2020   S/P nasal septoplasty 03/19/2020   Special screening for malignant neoplasms, colon 01/09/2018    REFERRING DIAG: D32.9 (ICD-10-CM) - Meningioma ([facility]) G82.50 (ICD-10-CM) - 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[place]) R56.9 (ICD-10-CM) - Seizures ([place])    THERAPY DIAG:  Other abnormalities of gait and mobility  Unsteadiness on feet  Muscle weakness (generalized)  PERTINENT HISTORY: anxiety, OA, cancer, subdural hematoma     PRECAUTIONS: Fall  SUBJECTIVE: Pt reports he has been "taking it easy" due to wife having teeth pulled this week and having lots of pain. Did stand to his walker once, but states he did not stand well. No other changes.   PAIN:  Are you having pain? 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 There are mild chronic small-vessel ischemic changes of the white matter. There is atrophy, encephalomalacia and gliosis along the medial aspects of both sides of the brain at the frontoparietal vertex, sequela of the previous tumor and subsequent resection. It appears that the midportion of the superior sagittal sinus has been resected. No evidence of tumor in the venous sinuses anterior or posterior to that.  From MRI on 5/21: Redemonstrated sequela of prior vertex craniotomy/cranioplasty and parafalcine meningioma resection with partial resection of the superior sagittal sinus. No evidence of residual/recurrent tumor at the resection site.   Chronic intracranial findings without interval change, as described.   Paranasal sinus disease, as outlined.       TODAY'S TREATMENT:   NMR  -Pt performed 2 sit <>stands from power chair to bariatric RW w/mod A for bracing of RW, proper foot placement of RLE and assistance w/hip extension. Max tactile cues provided at anterior shoulder and glute max for upright posture. Pt able to hold stand for 3-4 minutes each, noted significant forward lean and over reliance on BUEs. Mod A for stand <>sit due to poor eccentric control.   Lengthy discussion regarding importance of proper positioning and strengthening of bilateral hip flexors/posterior chain to facilitate upright posture in weightbearing. Added modified Thomas Stretch and Supine bridge to HEP (see bolded below) for improved hip flexor strength, ROM and posterior chain strength. Provided handout of exercises and pt and wife verbalized understanding.   PATIENT EDUCATION: Education details: Additions to HEP  Person educated: Patient and Spouse Education method: Explanation, Demonstration, Tactile cues, and Verbal cues Education comprehension: verbalized  understanding and needs further education   HOME EXERCISE PROGRAM: [email or web address]  Access Code: [email or web address] URL: [email or web address] Date: 07/30/2021 Prepared by: Anthony Bail Nikhil Osei  Exercises - Modified Thomas Stretch  - 1 x daily - 7 x weekly - 3 sets - 4 reps - 45-60second hold - Supine Bridge  - 1 x daily - 7 x weekly - 3 sets - 10 reps   ASSESSMENT:   CLINICAL IMPRESSION: Emphasis of skilled PT session on transfers and updating HEP for facilitation of upright posture. Pt continues to be limited by hip flexor tightness and weakness in spinal/hip extensors, resulting in significant forward lean posture w/standing and difficulty w/lateral weight shifting required for gait. Pt to have // bars installed at his home this week which will allow him to safely practice standing tolerance for improved stamina and strength. Continue POC.   OBJECTIVE IMPAIRMENTS decreased activity tolerance, decreased balance, decreased coordination, decreased endurance, decreased knowledge of use of DME, decreased mobility, difficulty walking,  decreased ROM, decreased strength, hypomobility, impaired perceived functional ability, impaired flexibility, impaired UE functional use, and postural dysfunction.    ACTIVITY LIMITATIONS cleaning, community activity, meal prep, laundry, and medication management.    PERSONAL FACTORS Time since onset of injury/illness/exacerbation and 3+ comorbidities: anxiety, OA, cancer, subdural hematoma  are also affecting patient's functional outcome.      REHAB POTENTIAL: Fair due to amount of time since onset and previous progress with PT   CLINICAL DECISION MAKING: Evolving/moderate complexity   EVALUATION COMPLEXITY: Moderate     GOALS: Goals reviewed with patient? Yes  OLD LONG TERM GOALS FOR UPDATED POC:  Target date: 07/23/2021  Pt/caregiver will be IND with final HEP in order to indicate  improved functional mobility and dec fall risk. Baseline:  Goal status: IN PROGRESS  2.  Pt will tolerate standing in // bars x 10 mins, with intermittent single UE support while reaching outside of BOS at mod I level in order to indicate improved functional mobility.   Baseline: 7:47 on 07/23/21 Goal status: IN PROGRESS  3.  Pt will improve bilat ankle DF by 5 degrees and bilat knee extension by 6 degrees for improved body mechanics in standing and functional mobility Baseline: On 5/11: R knee ext = -8, R ankle DF= -12, L knee ext = -17, L ankle DF = -25 Goal status: NOT MET  4.  Pt will perform sit<>stand in // bars at [facility] level in order to indicate improved functional mobility.  Baseline: min-max A 2/2 fatigue Goal status: MET  5.  Pt will perform lateral scoot transfers with or without slide board w/min A for improved independence with transfers Baseline: min-max A w/slide board  Goal status: INITIAL  NEW LONG TERM GOALS FOR EXTENDED POC:  Target date: 08/20/2021  Pt/caregiver will be IND with final HEP in order to indicate improved functional mobility and dec fall risk. Baseline:  Goal status: IN PROGRESS  2.  Pt will perform sit <>stand from 20" mat table to bariatric RW w/Min A for improved BLE strength and transfers  Baseline: Min A from 23" mat  Goal status: INITIAL  3.  Pt will ambulate 20' w/RW and body weight support as necessary w/mod A of single therapist for improved weightbearing tolerance and global strength  Baseline: 10' w/BW support and mod A x2  Goal status: INITIAL  PLAN: PT FREQUENCY: 2x/week   PT DURATION: 16 weeks (recert)   PLANNED INTERVENTIONS: Therapeutic exercises, Therapeutic activity, Neuromuscular re-education, Balance training, Gait training, Patient/Family education, Joint mobilization, Vestibular training, Orthotic/Fit training, DME instructions, and Manual therapy   PLAN FOR NEXT SESSION: Body-weight support gait training, Continue gait  training in // bars. Provide handout of standing marches for HEP. Practice sit <>stand in // bars with wife. Lateral weight shifting in standing, rolling using PNF principles. Lateral scooting on mat, sit <>supine transfers, add strengthening to HEP as able. In therapy, use stedy for weight shifting, reaching,  trunk dissociation, sit <>stands in // bars.  He did so well seated in w/c facing mat, placing elbows on mat and lifting buttocks>coming to stand>weight shifting.  If we ever have +2A, I would love to try and get him prone.  He did not have a great experience with it in the past, but UEs were not positioned well. Sit > supine >sidelying and rolling practice.    Cruzita Lederer Jeramie Scogin, PT, DPT 07/30/21, 2:00 PM

## 2021-07-30 NOTE — Therapy (Addendum)
OUTPATIENT OCCUPATIONAL THERAPY TREATMENT & RECERTIFICATION  Patient Name: Anthony Downs MRN: [id number] DOB:[birth year], 71 y.o., male Today's Date: 07/30/2021  PCP: Redmond School, MD REFERRING PROVIDER: Alger Simons MD  END OF SESSION:   OT End of Session - 07/30/21 1450     Visit Number 30    Number of Visits 54    Date for OT Re-Evaluation 10/22/21    Authorization Type Medicare and UHC    Authorization Time Period UHC VL - Need medical auth after 30 visits    Progress Note Due on Visit 40    OT Start Time 1440    OT Stop Time 1525    OT Time Calculation (min) 45 min    Activity Tolerance Patient tolerated treatment well    Behavior During Therapy WFL for tasks assessed/performed                 ONSET DATE: 04/28/20  REFERRING DIAG: Meningioma, Tetraplegia  THERAPY DIAG:  Other abnormalities of gait and mobility  Other disturbances of skin sensation  Unsteadiness on feet  Muscle weakness (generalized)  Other lack of coordination  Stiffness of right elbow, not elsewhere classified  Stiffness of right shoulder, not elsewhere classified     Past Medical History:  Diagnosis Date   Acid reflux    Anxiety    Arthritis    Asthma    as a child   Basal cell carcinoma 01/26/2021   RIGHT POST SURICULAR INFERIOR   Basal cell carcinoma 01/26/2021   RIGHT POST AURICULAR SUPERIOR   Benign brain tumor ([place])    Cancer ([place])    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, squamous behind ear   Complication of anesthesia    slow to wake up and pain medicines make him sick   GERD (gastroesophageal reflux disease)    Hypercholesteremia    Hypertension    PONV (postoperative nausea and vomiting)    Sleep apnea    no Cpap   Subdural hematoma, 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   fell off truck hit head on concrete   Past Surgical History:  Procedure Laterality Date   APPLICATION OF CRANIAL NAVIGATION Left 04/29/2020   Procedure: [place];   Surgeon: Judith Part, MD;  Location: [facility];  Service: Neurosurgery;  Laterality: Left;   arthroscopic knee     COLONOSCOPY N/A 11/29/2018   Procedure: COLONOSCOPY;  Surgeon: Rogene Houston, MD;  [facility];  Service: Endoscopy;  Laterality: N/A;  730-rescheduled 9/2 same time per Ann   CRANIOTOMY Left 04/29/2020   Procedure: LEFT CRANIECTOMY WITH TUMOR EXCISION;  Surgeon: Judith Part, MD;  Location: [facility];  Service: Neurosurgery;  Laterality: Left;   KNEE SURGERY Right    NASAL SEPTOPLASTY W/ TURBINOPLASTY Bilateral 03/19/2020   Procedure: NASAL SEPTOPLASTY WITH BILATERAL  TURBINATE REDUCTION;  Surgeon: Leta Baptist, MD;  Location: [facility];  Service: ENT;  Laterality: Bilateral;   VASECTOMY     Patient Active Problem List   Diagnosis Date Noted   Seizure ([place]) 08/13/2020   Seizures ([place]) 08/12/2020   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   DVT (deep venous thrombosis) ([place]) 08/12/2020   COVID-19 virus infection 08/12/2020   Acute lower UTI 07/14/2020   Delirium 07/06/2020   Sleep apnea 07/06/2020   GERD (gastroesophageal reflux disease) 07/06/2020   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[place]) 06/25/2020   Sundowning 06/25/2020   Slow transit constipation    Essential hypertension    Hypokalemia    Postoperative pain  Meningioma ([place]) 05/02/2020   Brain tumor ([place]) 04/28/2020   S/P nasal septoplasty 03/19/2020   Special screening for malignant neoplasms, colon 01/09/2018    ONSET DATE: 04/28/20  REFERRING DIAG: Meningioma, Tetraplegia  PERTINENT HISTORY: traumatic SDH 2008  PRECAUTIONS: Fall Risk  SUBJECTIVE: "got my belly full so I'm happy"  PAIN:  Are you having pain? No    TODAY'S TREATMENT:  07/30/21  Pt reports working on standing with the walker in PT Equalizer x 15 reps low seated row and seated vertical chest press 10 lbs RUE with mod assistance and LUE 15 lbs Assessed goals and reviewed additions and updates to goals d/t continued progress.     HOME  EXERCISE PROGRAM: Encouraged to do table slides and pick up supine shoulder stretches/ROM exercises.  Issued 1/2 inch blocks for coordination  GOALS: Goals reviewed with patient? Yes   SHORT TERM GOALS:  Target Date: 08/20/21  1.  Patient and caregiver will complete HEP designed to improve passive and active range of motion in BUE  Baseline:  Goal status: ACHIEVED   2.  Patient will utilize RUE to assist with at least one ADL task with mod cueing and minimal assistance  Baseline:  Goal status: ACHIEVED   3.  Patient will reach at least mid calf toward feet with BUE in preparation for participating in LB dressing  Baseline:  Goal status: ACHIEVED   4. Patient will demonstrate sufficient lateral lean left or right in seated position to prepare for placement of sliding board to encourage more active transfers and increase potential for commode transfers.                          Baseline:                          Goal status: ACHIEVED  5. Pt will be educated on available AE and DME to assist with ADL completion as needed for improved independence.   Baseline:  Goal status: ACHIEVED  6. Patient will demonstrate improved coordination in RUE as evidenced by at least 13 blocks (box in blocks) in one minute   Baseline: 9 blocks at renewal 05/28/21  Goal status: IN PROGRESS - RUE 10 blocks       LONG TERM GOALS: Target date: 10/22/2021   1.  Patient will complete updated HEP designed to improve range, strength, and functional use of BUE  Baseline:  Goal status: ONGOING - continue updating HEPs PRN   2.  Patient will complete a level surface transfer with slide board PRN with no more than mod assistance in preparation for commode transfer  Baseline:  Goal status: ACHIEVED level transfer with min A   3.  Patient will demonstrate improved coordination in RUE as evidenced by at least 8 blocks (box in blocks) in one minute  Baseline: 4 blocks Goal status: ACHIEVED 9 blocks   4.   Patient will demonstrate a 4lb increase in grip strength in RUE  Baseline: 6.1  Goal status: Achieved 5/31 - 12.9lb    5. Patient will complete at least 30% more of bathing and dressing tasks   Baseline:  Goal status: ACHIEVED  per pt wife-60-70%  6. Pt will assist with clothing management and hygiene with unilateral support with min A with toileting.  Baseline: Goal Status: ACHIEVED - 7/13 assisting with bathing and LB dressing, assisting with clothing but not hygiene   7. Pt will  increase range of motion in AROM in RUE shoulder flexion by at least 10 degrees for increasing ability to use right, dominant, hand, for more functional reaching tasks and ADLs.  Baseline: 70 degrees  Goal Status: ACHIEVED 7/13 RUE shoulder flexion 85*  8. Pt will stand with rolling walker for at least 3 minutes without physical assistance for caregiver to assist with clothing management and hygiene to progress towards increased independence with toileting.   Baseline: currently beginning to stand with rolling walker  [place] status: INITIAL  9. Patient will demonstrate improved coordination in RUE as evidenced by at least 15 blocks (box in blocks) in one minute   Baseline: 9 blocks at renewal 05/28/21  Goal status: IN PROGRESS - RUE 10 blocks  10. Pt will increase range of motion in AROM in RUE shoulder flexion to at least 90 degrees for increasing ability to use right, dominant, hand, for more functional reaching tasks and ADLs.  Baseline: 85 degrees AT RECERT  Goal Status: INITIAL       Plan     Clinical Impression Statement 07/30/21 This note serves as not for recertification and progress note for continuing and extending plan of care for continued progress with mobility, functional transfers and increased independence with ADLs ad IADLs. Skilled occupational therapy is recommended to target listed areas of deficit and increase independence with ADLs and IADLs and decrease caregiver burden.     OT  Occupational Profile and History Detailed Assessment- Review of Records and additional review of physical, cognitive, psychosocial history related to current functional performance    Occupational performance deficits (Please refer to evaluation for details): ADL's;IADL's;Leisure    Body Structure / Function / Physical Skills ADL;Decreased knowledge of use of DME;Gait;Obesity;Strength;Tone;GMC;Dexterity;Balance;Body mechanics;Edema;Proprioception;UE functional use;Cardiopulmonary status limiting activity;Endurance;IADL;ROM;Continence;Coordination;Flexibility;Mobility;Sensation;FMC;Decreased knowledge of precautions;Muscle spasms    Rehab Potential Good    Clinical Decision Making Several treatment options, min-mod task modification necessary    Comorbidities Affecting Occupational Performance: May have comorbidities impacting occupational performance    Modification or Assistance to Complete Evaluation  Min-Moderate modification of tasks or assist with assess necessary to complete eval    OT Frequency 2x / week    OT Duration 12 weeks    OT Treatment/Interventions Self-care/ADL training;Aquatic Therapy;DME and/or AE instruction;Splinting;Balance training;Therapeutic activities;Therapeutic exercise;Cognitive remediation/compensation;Passive range of motion;Functional Mobility Training;Neuromuscular education;Electrical Stimulation;Energy conservation;Patient/family education;Manual Therapy    Plan Continue towards new and updated goals from recertification, standing tolerance with rolling walker, BUE ROM and strengthening, coordination for RUE.    Consulted and Agree with Plan of Care Patient;Family member/caregiver    Family Member Consulted wife             Zachery Conch, [place] 07/30/2021, 3:23 PM

## 2021-08-03 ENCOUNTER — Encounter: Payer: Self-pay | Admitting: Occupational Therapy

## 2021-08-03 ENCOUNTER — Ambulatory Visit: Payer: Medicare Other | Admitting: Physical Therapy

## 2021-08-03 ENCOUNTER — Ambulatory Visit: Payer: Medicare Other | Admitting: Occupational Therapy

## 2021-08-03 ENCOUNTER — Ambulatory Visit (INDEPENDENT_AMBULATORY_CARE_PROVIDER_SITE_OTHER): Payer: Medicare Other | Admitting: Urology

## 2021-08-03 VITALS — BP 123/65 | HR 68 | Ht 73.0 in | Wt 262.0 lb

## 2021-08-03 DIAGNOSIS — R2689 Other abnormalities of gait and mobility: Secondary | ICD-10-CM

## 2021-08-03 DIAGNOSIS — R278 Other lack of coordination: Secondary | ICD-10-CM

## 2021-08-03 DIAGNOSIS — N138 Other obstructive and reflux uropathy: Secondary | ICD-10-CM

## 2021-08-03 DIAGNOSIS — M6281 Muscle weakness (generalized): Secondary | ICD-10-CM

## 2021-08-03 DIAGNOSIS — R350 Frequency of micturition: Secondary | ICD-10-CM

## 2021-08-03 DIAGNOSIS — N401 Enlarged prostate with lower urinary tract symptoms: Secondary | ICD-10-CM | POA: Diagnosis not present

## 2021-08-03 DIAGNOSIS — R2681 Unsteadiness on feet: Secondary | ICD-10-CM

## 2021-08-03 DIAGNOSIS — M25611 Stiffness of right shoulder, not elsewhere classified: Secondary | ICD-10-CM

## 2021-08-03 DIAGNOSIS — M25621 Stiffness of right elbow, not elsewhere classified: Secondary | ICD-10-CM

## 2021-08-03 DIAGNOSIS — R208 Other disturbances of skin sensation: Secondary | ICD-10-CM
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
## 2021-08-03 NOTE — Therapy (Signed)
OUTPATIENT PHYSICAL THERAPY TREATMENT NOTE   Patient Name: Anthony Downs MRN: [id number] DOB:09-Dec-1950, 71 y.o., male Today's Date: 08/03/2021  PCP: Redmond School, MD REFERRING PROVIDER: [name], MD          PT End of Session - 08/03/21 1233     Visit Number 31    Number of Visits 33   plus eval   Date for PT Re-Evaluation 20/94/70   Recert   Authorization Type Medicare and UHC (needs 10th visit progress note)    Progress Note Due on Visit 10    PT Start Time 20    PT Stop Time 1318    PT Time Calculation (min) 48 min    Equipment Utilized During Treatment Other (comment)   Large SOT harness and Biodex body-weight support system   Activity Tolerance Patient tolerated treatment well    Behavior During Therapy WFL for tasks assessed/performed                               Past Medical History:  Diagnosis Date   Acid reflux    Anxiety    Arthritis    Asthma    as a child   Basal cell carcinoma 01/26/2021   RIGHT POST SURICULAR INFERIOR   Basal cell carcinoma 01/26/2021   RIGHT POST AURICULAR SUPERIOR   Benign brain tumor ([place])    Cancer ([place])    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, squamous behind ear   Complication of anesthesia    slow to wake up and pain medicines make him sick   GERD (gastroesophageal reflux disease)    Hypercholesteremia    Hypertension    PONV (postoperative nausea and vomiting)    Sleep apnea    no Cpap   Subdural hematoma, 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   fell off truck hit head on concrete   Past Surgical History:  Procedure Laterality Date   APPLICATION OF CRANIAL NAVIGATION Left 04/29/2020   Procedure: [place];  Surgeon: Judith Part, MD;  Location: [place];  Service: Neurosurgery;  Laterality: Left;   arthroscopic knee     COLONOSCOPY N/A 11/29/2018   Procedure: COLONOSCOPY;  Surgeon: Rogene Houston, MD;  [facility];  Service: Endoscopy;  Laterality:  N/A;  730-rescheduled 9/2 same time per Ann   CRANIOTOMY Left 04/29/2020   Procedure: LEFT CRANIECTOMY WITH TUMOR EXCISION;  Surgeon: Judith Part, MD;  Location: [facility];  Service: Neurosurgery;  Laterality: Left;   KNEE SURGERY Right    NASAL SEPTOPLASTY W/ TURBINOPLASTY Bilateral 03/19/2020   Procedure: NASAL SEPTOPLASTY WITH BILATERAL  TURBINATE REDUCTION;  Surgeon: Leta Baptist, MD;  Location: [facility];  Service: ENT;  Laterality: Bilateral;   VASECTOMY     Patient Active Problem List   Diagnosis Date Noted   Seizure ([place]) 08/13/2020   Seizures ([place]) 08/12/2020   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   DVT (deep venous thrombosis) ([place]) 08/12/2020   COVID-19 virus infection 08/12/2020   Acute lower UTI 07/14/2020   Delirium 07/06/2020   Sleep apnea 07/06/2020   GERD (gastroesophageal reflux disease) 07/06/2020   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[place]) 06/25/2020   Sundowning 06/25/2020   Slow transit constipation    Essential hypertension    Hypokalemia    Postoperative pain    Meningioma ([place]) 05/02/2020   Brain tumor ([place]) 04/28/2020   S/P nasal septoplasty 03/19/2020   Special screening for malignant neoplasms, colon 01/09/2018  REFERRING DIAG: D32.9 (ICD-10-CM) - Meningioma ([place]) G82.50 (ICD-10-CM) - 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[place]) R56.9 (ICD-10-CM) - Seizures ([place])    THERAPY DIAG:  Other abnormalities of gait and mobility  Unsteadiness on feet  Muscle weakness (generalized)  Other lack of coordination  PERTINENT HISTORY: anxiety, OA, cancer, subdural hematoma     PRECAUTIONS: Fall  SUBJECTIVE: Pt reports he stood to the walker at home w/just the wife, states it went well and he stood for 3-4 minutes. Has been doing his HEP. Did get his // bars installed but has not used them yet due to weather   PAIN:  Are you having pain? 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 There  are mild chronic small-vessel ischemic changes of the white matter. There is atrophy, encephalomalacia and gliosis along the medial aspects of both sides of the brain at the frontoparietal vertex, sequela of the previous tumor and subsequent resection. It appears that the midportion of the superior sagittal sinus has been resected. No evidence of tumor in the venous sinuses anterior or posterior to that.  From MRI on 5/21: Redemonstrated sequela of prior vertex craniotomy/cranioplasty and parafalcine meningioma resection with partial resection of the superior sagittal sinus. No evidence of residual/recurrent tumor at the resection site.   Chronic intracranial findings without interval change, as described.   Paranasal sinus disease, as outlined.       TODAY'S TREATMENT:   NMR  Body-weight supported gait training using 30# of BW support and size L SOT harness. 3 therapists, tech and wife assisting to provide [facility] follow and drive Biodex: -Pt performed sit <>stands throughout session w/min-mod A, requiring more assistance earlier in session due to retro lean and assistance w/trunk and hip extension -Pt ambulated 3' 5' and 15' w/bariatric RW, mod A to progress RLE through swing phase and mod-max A for lateral weight shifting at pelvis. Provided mirror on second gait trial to provide visual biofeedback on body position, as pt rotating too far to L side in order to [facility] RLE due to weak hip flexors. On final gait trial, pt able to [facility] RLE almost completely, requiring min A to bring RLE forward. Pt continues to demonstrate significant ER of RLE, forward trunk lean and poor spatial awareness with gait despite visual and verbal cues. Pt fatigued very quickly and required lengthy seated rest break in between trials.    PATIENT EDUCATION: Education details: How to safely set-up // bars at home (have person w/chair behind patient and another person in front of pt) Person educated: Patient and  Spouse Education method: Explanation, Demonstration, Tactile cues, and Verbal cues Education comprehension: verbalized understanding and needs further education   HOME EXERCISE PROGRAM: [email or web address]  Access Code: [email or web address] URL: [email or web address] Date: 07/30/2021 Prepared by: Mickie Bail Taren Toops  Exercises - Modified Thomas Stretch  - 1 x daily - 7 x weekly - 3 sets - 4 reps - 45-60second hold - Supine Bridge  - 1 x daily - 7 x weekly - 3 sets - 10 reps   ASSESSMENT:   CLINICAL IMPRESSION: Emphasis of skilled PT session on gait training, weightbearing tolerance and endurance. Pt continues to be limited by poor body awareness, hip flexor tightness and global deconditioning but was able to ambulate 15' w/significant assistance. Pt not a functional ambulator at this time but is displaying significant improvements in BLE strength, stamina and body mechanics. Continue POC.   OBJECTIVE  IMPAIRMENTS decreased activity tolerance, decreased balance, decreased coordination, decreased endurance, decreased knowledge of use of DME, decreased mobility, difficulty walking, decreased ROM, decreased strength, hypomobility, impaired perceived functional ability, impaired flexibility, impaired UE functional use, and postural dysfunction.    ACTIVITY LIMITATIONS cleaning, community activity, meal prep, laundry, and medication management.    PERSONAL FACTORS Time since onset of injury/illness/exacerbation and 3+ comorbidities: anxiety, OA, cancer, subdural hematoma  are also affecting patient's functional outcome.      REHAB POTENTIAL: Fair due to amount of time since onset and previous progress with PT   CLINICAL DECISION MAKING: Evolving/moderate complexity   EVALUATION COMPLEXITY: Moderate     GOALS: Goals reviewed with patient? Yes  OLD LONG TERM GOALS FOR UPDATED POC:  Target date: 07/23/2021  Pt/caregiver will  be IND with final HEP in order to indicate improved functional mobility and dec fall risk. Baseline:  Goal status: IN PROGRESS  2.  Pt will tolerate standing in // bars x 10 mins, with intermittent single UE support while reaching outside of BOS at mod I level in order to indicate improved functional mobility.   Baseline: 7:47 on 07/23/21 Goal status: IN PROGRESS  3.  Pt will improve bilat ankle DF by 5 degrees and bilat knee extension by 6 degrees for improved body mechanics in standing and functional mobility Baseline: On 5/11: R knee ext = -8, R ankle DF= -12, L knee ext = -17, L ankle DF = -25 Goal status: NOT MET  4.  Pt will perform sit<>stand in // bars at [facility] level in order to indicate improved functional mobility.  Baseline: min-max A 2/2 fatigue Goal status: MET  5.  Pt will perform lateral scoot transfers with or without slide board w/min A for improved independence with transfers Baseline: min-max A w/slide board  Goal status: MET  NEW LONG TERM GOALS FOR EXTENDED POC:  Target date: 08/20/2021  Pt/caregiver will be IND with final HEP in order to indicate improved functional mobility and dec fall risk. Baseline:  Goal status: IN PROGRESS  2.  Pt will perform sit <>stand from 20" mat table to bariatric RW w/Min A for improved BLE strength and transfers  Baseline: Min A from 23" mat  Goal status: INITIAL  3.  Pt will ambulate 20' w/RW and body weight support as necessary w/mod A of single therapist for improved weightbearing tolerance and global strength  Baseline: 10' w/BW support and mod A x2  Goal status: INITIAL  PLAN: PT FREQUENCY: 2x/week   PT DURATION: 16 weeks (recert)   PLANNED INTERVENTIONS: Therapeutic exercises, Therapeutic activity, Neuromuscular re-education, Balance training, Gait training, Patient/Family education, Joint mobilization, Vestibular training, Orthotic/Fit training, DME instructions, and Manual therapy   PLAN FOR NEXT SESSION: Body-weight  support gait training, Continue gait training in // bars. Provide handout of standing marches for HEP. Practice sit <>stand in // bars with wife. Lateral weight shifting in standing, rolling using PNF principles. Lateral scooting on mat, sit <>supine transfers, add strengthening to HEP as able. In therapy, use stedy for weight shifting, reaching,  trunk dissociation, sit <>stands in // bars.  He did so well seated in w/c facing mat, placing elbows on mat and lifting buttocks>coming to stand>weight shifting.  If we ever have +2A, I would love to try and get him prone.  He did not have a great experience with it in the past, but UEs were not positioned well. Sit > supine >sidelying and rolling practice.    Estefan Pattison E Lakindra Wible,  PT, DPT 08/03/21, 3:37 PM

## 2021-08-06 ENCOUNTER — Encounter: Payer: Self-pay | Admitting: Occupational Therapy

## 2021-08-06 ENCOUNTER — Ambulatory Visit: Payer: Medicare Other | Admitting: Physical Therapy

## 2021-08-06 ENCOUNTER — Ambulatory Visit: Payer: Medicare Other | Admitting: Occupational Therapy

## 2021-08-06 DIAGNOSIS — M25621 Stiffness of right elbow, not elsewhere classified: Secondary | ICD-10-CM

## 2021-08-06 DIAGNOSIS — M25611 Stiffness of right shoulder, not elsewhere classified: Secondary | ICD-10-CM

## 2021-08-06 DIAGNOSIS — R29898 Other symptoms and signs involving the musculoskeletal system: Secondary | ICD-10-CM

## 2021-08-06 DIAGNOSIS — R208 Other disturbances of skin sensation: Secondary | ICD-10-CM

## 2021-08-06 DIAGNOSIS — R2681 Unsteadiness on feet: Secondary | ICD-10-CM

## 2021-08-06 DIAGNOSIS — R2689 Other abnormalities of gait and mobility: Secondary | ICD-10-CM

## 2021-08-06 DIAGNOSIS — R293 Abnormal posture: Secondary | ICD-10-CM

## 2021-08-06 DIAGNOSIS — M6281 Muscle weakness (generalized): Secondary | ICD-10-CM

## 2021-08-06 DIAGNOSIS — R278 Other lack of coordination: Secondary | ICD-10-CM

## 2021-08-06 NOTE — Therapy (Signed)
OUTPATIENT PHYSICAL THERAPY TREATMENT NOTE   Patient Name: Anthony Downs MRN: [id number] DOB:1950-05-25, 71 y.o., male Today's Date: 08/06/2021  PCP: Redmond School, MD REFERRING PROVIDER: [name], MD          PT End of Session - 08/06/21 1106     Visit Number 32    Number of Visits 33   plus eval   Date for PT Re-Evaluation 81/10/31   Recert   Authorization Type Medicare and [place] (needs 10th visit progress note)    Progress Note Due on Visit 30    PT Start Time 1104    PT Stop Time 1146    PT Time Calculation (min) 42 min    Equipment Utilized During Treatment Other (comment)   Large SOT harness and Biode[employer] body-weight support system   Activity Tolerance Patient tolerated treatment well    Behavior During Therapy WFL for tasks assessed/performed                               Past Medical History:  Diagnosis Date   Acid reflux    Anxiety    Arthritis    Asthma    as a child   Basal cell carcinoma 01/26/2021   RIGHT POST SURICULAR INFERIOR   Basal cell carcinoma 01/26/2021   RIGHT POST AURICULAR SUPERIOR   Benign brain tumor ([place])    Cancer ([place])    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, squamous behind ear   Complication of anesthesia    slow to wake up and pain medicines make him sick   GERD (gastroesophageal reflux disease)    Hypercholesteremia    Hypertension    PONV (postoperative nausea and vomiting)    Sleep apnea    no Cpap   Subdural hematoma, 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   fell off truck hit head on concrete   Past Surgical History:  Procedure Laterality Date   APPLICATION OF CRANIAL NAVIGATION Left 04/29/2020   Procedure: [place];  Surgeon: Judith Part, MD;  Location: [place];  Service: Neurosurgery;  Laterality: Left;   arthroscopic knee     COLONOSCOPY N/A 11/29/2018   Procedure: COLONOSCOPY;  Surgeon: Rogene Houston, MD;  [facility];  Service: Endoscopy;  Laterality:  N/A;  730-rescheduled 9/2 same time per Ann   CRANIOTOMY Left 04/29/2020   Procedure: LEFT CRANIECTOMY WITH TUMOR EXCISION;  Surgeon: Judith Part, MD;  Location: [place];  Service: Neurosurgery;  Laterality: Left;   KNEE SURGERY Right    NASAL SEPTOPLASTY W/ TURBINOPLASTY Bilateral 03/19/2020   Procedure: NASAL SEPTOPLASTY WITH BILATERAL  TURBINATE REDUCTION;  Surgeon: Leta Baptist, MD;  Location: [place];  Service: ENT;  Laterality: Bilateral;   VASECTOMY     Patient Active Problem List   Diagnosis Date Noted   Seizure ([place]) 08/13/2020   Seizures ([place]) 08/12/2020   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   DVT (deep venous thrombosis) ([place]) 08/12/2020   COVID-19 virus infection 08/12/2020   Acute lower UTI 07/14/2020   Delirium 07/06/2020   Sleep apnea 07/06/2020   GERD (gastroesophageal reflux disease) 07/06/2020   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[place]) 06/25/2020   Sundowning 06/25/2020   Slow transit constipation    Essential hypertension    Hypokalemia    Postoperative pain    Meningioma ([place]) 05/02/2020   Brain tumor ([place]) 04/28/2020   S/P nasal septoplasty 03/19/2020   Special screening for malignant neoplasms, colon 01/09/2018  REFERRING DIAG: D32.9 (ICD-10-CM) - Meningioma ([place]) G82.50 (ICD-10-CM) - 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[place]) R56.9 (ICD-10-CM) - Seizures ([place])    THERAPY DIAG:  Other abnormalities of gait and mobility  Unsteadiness on feet  Muscle weakness (generalized)  PERTINENT HISTORY: anxiety, OA, cancer, subdural hematoma     PRECAUTIONS: Fall  SUBJECTIVE: Pt reports he stood to the walker at home w/just the wife, having difficulty standing up tall without retropulsion. Still too hot to work with // bars outside.   PAIN:  Are you having pain? 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 There are mild chronic small-vessel ischemic changes of the white matter. There  is atrophy, encephalomalacia and gliosis along the medial aspects of both sides of the brain at the frontoparietal vertex, sequela of the previous tumor and subsequent resection. It appears that the midportion of the superior sagittal sinus has been resected. No evidence of tumor in the venous sinuses anterior or posterior to that.  From MRI on 5/21: Redemonstrated sequela of prior vertex craniotomy/cranioplasty and parafalcine meningioma resection with partial resection of the superior sagittal sinus. No evidence of residual/recurrent tumor at the resection site.   Chronic intracranial findings without interval change, as described.   Paranasal sinus disease, as outlined.       TODAY'S TREATMENT:   NMR  Body-weight supported gait training using 25-30# of BW support and size L SOT harness. 3 therapists and wife assisting to provide WC follow, drive [employer]ode[employer] and facilitate swing phase of RLE: -Pt performed sit <>stands throughout session w/min A-mod A x1, requiring more assistance later in session due to retro lean and fatigue -Pt ambulated 24', 29' and 12' w/mod-max A x2 for lateral weight shifting and progression of RLE. Therapist provided mod-max verbal cues throughout for cadence of gait. Noted significant ER of RLE on first gait trial w/toe catching floor. Added toe cover and wrapped gait belt around distal calf on trials 2 &3 to facilitate swing phase of RLE and pt able to take larger steps on R side but continue to demonstrate significant ER. On final trial, therapist facilitated too large of a step on R side, causing pt to lose balance and unable to , requiring 5 therapists to return to chair safely.      PATIENT EDUCATION: Education details: Continue HEP Person educated: Patient and Spouse Education method: Explanation, Demonstration, Corporate treasurer cues, and Verbal cues Education comprehension: verbalized understanding and needs further education   HOME EXERCISE  PROGRAM: [email or web address]  Access Code: [email or web address] URL: [email or web address] Date: 07/30/2021 Prepared by: Mickie Bail Quin Mat[place]  Exercises - Modified Thomas Stretch  - 1 x daily - 7 x weekly - 3 sets - 4 reps - 45-60second hold - Supine Bridge  - 1 x daily - 7 x weekly - 3 sets - 10 reps   ASSESSMENT:   CLINICAL IMPRESSION: Emphasis of skilled PT session on gait training, postural stability, BLE strength and endurance. Pt ambulated a total of 65', with his longest bout being 29'. Pt continues to require mod-max A of two therapists for gait for facilitation of upright posture, lateral weight shifting and progression of RLE during gait. Continue POC.   OBJECTIVE IMPAIRMENTS decreased activity tolerance, decreased balance, decreased coordination, decreased endurance, decreased knowledge of use of DME, decreased mobility, difficulty walking, decreased ROM, decreased strength, hypomobility, impaired perceived functional ability, impaired flexibility, impaired UE functional use, and postural dysfunction.  ACTIVITY LIMITATIONS cleaning, community activity, meal prep, laundry, and medication management.    PERSONAL FACTORS Time since onset of injury/illness/exacerbation and 3+ comorbidities: anxiety, OA, cancer, subdural hematoma  are also affecting patient's functional outcome.      REHAB POTENTIAL: Fair due to amount of time since onset and previous progress with PT   CLINICAL DECISION MAKING: Evolving/moderate complexity   EVALUATION COMPLEXITY: Moderate     GOALS: Goals reviewed with patient? Yes  OLD LONG TERM GOALS FOR UPDATED POC:  Target date: 07/23/2021  Pt/caregiver will be IND with final HEP in order to indicate improved functional mobility and dec fall risk. Baseline:  Goal status: IN PROGRESS  2.  Pt will tolerate standing in // bars x 10 mins, with intermittent single UE support while reaching  outside of BOS at mod I level in order to indicate improved functional mobility.   Baseline: 7:47 on 07/23/21 Goal status: IN PROGRESS  3.  Pt will improve bilat ankle DF by 5 degrees and bilat knee extension by 6 degrees for improved body mechanics in standing and functional mobility Baseline: On 5/11: R knee ext = -8, R ankle DF= -12, L knee ext = -17, L ankle DF = -25 Goal status: NOT MET  4.  Pt will perform sit<>stand in // bars at [facility] level in order to indicate improved functional mobility.  Baseline: min-max A 2/2 fatigue Goal status: MET  5.  Pt will perform lateral scoot transfers with or without slide board w/min A for improved independence with transfers Baseline: min-max A w/slide board  Goal status: MET  NEW LONG TERM GOALS FOR EXTENDED POC:  Target date: 08/20/2021  Pt/caregiver will be IND with final HEP in order to indicate improved functional mobility and dec fall risk. Baseline:  Goal status: IN PROGRESS  2.  Pt will perform sit <>stand from 20" mat table to bariatric RW w/Min A for improved BLE strength and transfers  Baseline: Min A from 23" mat  Goal status: INITIAL  3.  Pt will ambulate 20' w/RW and body weight support as necessary w/mod A of single therapist for improved weightbearing tolerance and global strength  Baseline: 10' w/BW support and mod A x2  Goal status: INITIAL  PLAN: PT FREQUENCY: 2x/week   PT DURATION: 16 weeks (recert)   PLANNED INTERVENTIONS: Therapeutic exercises, Therapeutic activity, Neuromuscular re-education, Balance training, Gait training, Patient/Family education, Joint mobilization, Vestibular training, Orthotic/Fit training, DME instructions, and Manual therapy   PLAN FOR NEXT SESSION: Recert for added visits either next visit or visit after (I am sorry Raquel Sarna :( ) Body-weight support gait training, Continue gait training in // bars. Provide handout of standing marches for HEP. Practice sit <>stand in // bars with wife. Lateral  weight shifting in standing, rolling using PNF principles. Lateral scooting on mat, sit <>supine transfers, add strengthening to HEP as able. In therapy, use stedy for weight shifting, reaching,  trunk dissociation, sit <>stands in // bars.  He did so well seated in w/c facing mat, placing elbows on mat and lifting buttocks>coming to stand>weight shifting.  If we ever have +2A, I would love to try and get him prone.  He did not have a great experience with it in the past, but UEs were not positioned well. Sit > supine >sidelying and rolling practice.    Cruzita Lederer Basya Casavant, PT, DPT 08/06/21, 12:29 PM
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
## 2021-08-11 ENCOUNTER — Ambulatory Visit: Payer: Medicare Other | Admitting: Rehabilitation

## 2021-08-11 ENCOUNTER — Encounter: Payer: Self-pay | Admitting: Rehabilitation

## 2021-08-11 ENCOUNTER — Ambulatory Visit: Payer: Medicare Other | Admitting: Occupational Therapy

## 2021-08-11 ENCOUNTER — Encounter: Payer: Medicare Other | Admitting: Occupational Therapy

## 2021-08-11 DIAGNOSIS — R2681 Unsteadiness on feet: Secondary | ICD-10-CM

## 2021-08-11 DIAGNOSIS — M6281 Muscle weakness (generalized): Secondary | ICD-10-CM

## 2021-08-11 DIAGNOSIS — R2689 Other abnormalities of gait and mobility: Secondary | ICD-10-CM

## 2021-08-11 DIAGNOSIS — R278 Other lack of coordination: Secondary | ICD-10-CM

## 2021-08-11 DIAGNOSIS — R293 Abnormal posture: Secondary | ICD-10-CM

## 2021-08-11 DIAGNOSIS — M25621 Stiffness of right elbow, not elsewhere classified: Secondary | ICD-10-CM

## 2021-08-11 DIAGNOSIS — R208 Other disturbances of skin sensation: Secondary | ICD-10-CM

## 2021-08-11 DIAGNOSIS — R29898 Other symptoms and signs involving the musculoskeletal system: Secondary | ICD-10-CM

## 2021-08-11 DIAGNOSIS — R251 Tremor, unspecified: Secondary | ICD-10-CM

## 2021-08-11 DIAGNOSIS — M25611 Stiffness of right shoulder, not elsewhere classified: Secondary | ICD-10-CM
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
## 2021-08-11 NOTE — Therapy (Signed)
OUTPATIENT PHYSICAL THERAPY TREATMENT NOTE/RECERT NOTE  Patient Name: Anthony Downs MRN: [id number] DOB:[birth year], 71 y.o., male Today's Date: 08/11/2021  PCP: Redmond School, MD REFERRING PROVIDER: [name], MD          PT End of Session - 08/11/21 1159     Visit Number 33    Number of Visits 43   per updated POC   Date for PT Re-Evaluation 53/97/67   Recert   Authorization Type Medicare and [place] (needs 10th visit progress note)    Progress Note Due on Visit 46    PT Start Time 1100    PT Stop Time 1145    PT Time Calculation (min) 45 min    Equipment Utilized During Treatment Other (comment)   Large SOT harness and Biodex body-weight support system   Activity Tolerance Patient tolerated treatment well    Behavior During Therapy WFL for tasks assessed/performed                               Past Medical History:  Diagnosis Date   Acid reflux    Anxiety    Arthritis    Asthma    as a child   Basal cell carcinoma 01/26/2021   RIGHT POST SURICULAR INFERIOR   Basal cell carcinoma 01/26/2021   RIGHT POST AURICULAR SUPERIOR   Benign brain tumor ([place])    Cancer ([place])    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, squamous behind ear   Complication of anesthesia    slow to wake up and pain medicines make him sick   GERD (gastroesophageal reflux disease)    Hypercholesteremia    Hypertension    PONV (postoperative nausea and vomiting)    Sleep apnea    no Cpap   Subdural hematoma, 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   fell off truck hit head on concrete   Past Surgical History:  Procedure Laterality Date   APPLICATION OF CRANIAL NAVIGATION Left 04/29/2020   Procedure: [place];  Surgeon: Judith Part, MD;  Location: [place];  Service: Neurosurgery;  Laterality: Left;   arthroscopic knee     COLONOSCOPY N/A 11/29/2018   Procedure: COLONOSCOPY;  Surgeon: Rogene Houston, MD;  [facility];  Service:  Endoscopy;  Laterality: N/A;  730-rescheduled 9/2 same time per Ann   CRANIOTOMY Left 04/29/2020   Procedure: LEFT CRANIECTOMY WITH TUMOR EXCISION;  Surgeon: Judith Part, MD;  Location: [facility];  Service: Neurosurgery;  Laterality: Left;   KNEE SURGERY Right    NASAL SEPTOPLASTY W/ TURBINOPLASTY Bilateral 03/19/2020   Procedure: NASAL SEPTOPLASTY WITH BILATERAL  TURBINATE REDUCTION;  Surgeon: Leta Baptist, MD;  Location: [place];  Service: ENT;  Laterality: Bilateral;   VASECTOMY     Patient Active Problem List   Diagnosis Date Noted   Seizure ([place]) 08/13/2020   Seizures ([place]) 08/12/2020   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   DVT (deep venous thrombosis) ([place]) 08/12/2020   COVID-19 virus infection 08/12/2020   Acute lower UTI 07/14/2020   Delirium 07/06/2020   Sleep apnea 07/06/2020   GERD (gastroesophageal reflux disease) 07/06/2020   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[place]) 06/25/2020   Sundowning 06/25/2020   Slow transit constipation    Essential hypertension    Hypokalemia    Postoperative pain    Meningioma ([place]) 05/02/2020   Brain tumor ([place]) 04/28/2020   S/P nasal septoplasty 03/19/2020   Special screening for malignant neoplasms, colon  01/09/2018    REFERRING DIAG: D32.9 (ICD-10-CM) - Meningioma ([place]) G82.50 (ICD-10-CM) - 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[place]) R56.9 (ICD-10-CM) - Seizures ([place])    THERAPY DIAG:  Other abnormalities of gait and mobility  Unsteadiness on feet  Muscle weakness (generalized)  PERTINENT HISTORY: anxiety, OA, cancer, subdural hematoma     PRECAUTIONS: Fall  SUBJECTIVE: Standing to RW at home, has not utilized outdoor // bars yet.   PAIN:  Are you having pain? 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 There are mild chronic small-vessel ischemic changes of the white matter. There is atrophy, encephalomalacia and gliosis along the medial aspects of both  sides of the brain at the frontoparietal vertex, sequela of the previous tumor and subsequent resection. It appears that the midportion of the superior sagittal sinus has been resected. No evidence of tumor in the venous sinuses anterior or posterior to that.  From MRI on 5/21: Redemonstrated sequela of prior vertex craniotomy/cranioplasty and parafalcine meningioma resection with partial resection of the superior sagittal sinus. No evidence of residual/recurrent tumor at the resection site.   Chronic intracranial findings without interval change, as described.   Paranasal sinus disease, as outlined.       TODAY'S TREATMENT:   NMR  Body-weight supported gait training using 25-30# of BW support and size L SOT harness. 3 therapists and wife assisting to provide WC follow, drive Biodex and facilitate swing phase of RLE along with adequate L lateral weight shift and upright posture: -Pt performed sit <>stands throughout session w/min A-mod A x1, requiring more assistance later in session due to retro lean and fatigue -Pt ambulated 10', 20' and 27' w/mod-max A x2 for lateral weight shifting and progression of RLE. Therapist provided mod-max verbal cues throughout for cadence of gait. Also provided assist at pelvis for improved protraction during L lateral weight shift for ease of moving RLE during swing.  Shoe cover donned during all gait but still needs assist for full swing through.  Did note he is initiating swing better esp with facilitation for weight shift and upright posture.  First two trials of gait done with bari RW, however decided during last bout to utilize bars that attach to Biodex and this seemed to help improve posture and turning ability of Biodex.  Note improved initiation of R swing through, however with fatigue, needed more assist.  TA: Performed stand pivot transfer with bari RW from w/c to mat today (going L) at mod A (wife assisting to stabilize RW) with cues and facilitation  for adequate weight shift.  PT unable to move RLE very much during transfer but did not have shoe cover donned during transfer.    PATIENT EDUCATION: Education details: Continue HEP Person educated: Patient and Spouse Education method: Explanation, Demonstration, Corporate treasurer cues, and Verbal cues Education comprehension: verbalized understanding and needs further education   HOME EXERCISE PROGRAM: [email or web address]  Access Code: [email or web address] URL: [email or web address] Date: 07/30/2021 Prepared by: Mickie Bail Plaster  Exercises - Modified Thomas Stretch  - 1 x daily - 7 x weekly - 3 sets - 4 reps - 45-60second hold - Supine Bridge  - 1 x daily - 7 x weekly - 3 sets - 10 reps   ASSESSMENT:   CLINICAL IMPRESSION: Emphasis of skilled PT session on gait training, postural stability, BLE strength and endurance. Pt ambulated a total of 57', with his longest bout being 27'. Pt continues  to require mod-max A of two therapists for gait for facilitation of upright posture, lateral weight shifting and progression of RLE during gait. We did utilize bars that attach to Biodex at end of gait and this seemed to help with more upright posture.  Will recert for 5 more weeks (to cover next week cert/visit plus 4 more weeks) to address remaining deficits.   OBJECTIVE IMPAIRMENTS decreased activity tolerance, decreased balance, decreased coordination, decreased endurance, decreased knowledge of use of DME, decreased mobility, difficulty walking, decreased ROM, decreased strength, hypomobility, impaired perceived functional ability, impaired flexibility, impaired UE functional use, and postural dysfunction.    ACTIVITY LIMITATIONS cleaning, community activity, meal prep, laundry, and medication management.    PERSONAL FACTORS Time since onset of injury/illness/exacerbation and 3+ comorbidities: anxiety, OA, cancer, subdural hematoma  are  also affecting patient's functional outcome.      REHAB POTENTIAL: Fair due to amount of time since onset and previous progress with PT   CLINICAL DECISION MAKING: Evolving/moderate complexity   EVALUATION COMPLEXITY: Moderate     GOALS: Goals reviewed with patient? Yes  LONG TERM GOALS FOR EXTENDED POC:  Target date: 08/20/2021  Pt/caregiver will be IND with final HEP in order to indicate improved functional mobility and dec fall risk. Baseline:  Goal status: IN PROGRESS  2.  Pt will perform sit <>stand from 20" mat table to bariatric RW w/Min A for improved BLE strength and transfers  Baseline: Min A from 23" mat  Goal status: INITIAL  3.  Pt will ambulate 20' w/RW and body weight support as necessary w/mod A of single therapist for improved weightbearing tolerance and global strength  Baseline: 10' w/BW support and mod A x2  Goal status: INITIAL  NEW LONG TERM GOALS FOR EXTENDED POC:  Target date: 09/20/2021  Pt/caregiver will be IND with final HEP in order to indicate improved functional mobility and dec fall risk. Baseline:  Goal status: ONGOING  2.  Pt will perform sit <>stand from 20" mat table to bariatric RW at S level for improved BLE strength and transfers  Baseline: Min A from 23" mat  Goal status: REVISED  3.  Pt will ambulate 25' w/RW and body weight support as necessary w/mod A of single therapist for improved weightbearing tolerance and global strength  Baseline: 24' w/BW support and mod A x2  Goal status: ONGOING  4.  Pt will perform stand pivot transfer with bari RW (and LE bracing as needed) R and L at min A level in order to increase functional independence at home.   Baseline:   Goal Status: INITIAL   PLAN: PT FREQUENCY: 2x/week   PT DURATION: 5 weeks (recert)   PLANNED INTERVENTIONS: Therapeutic exercises, Therapeutic activity, Neuromuscular re-education, Balance training, Gait training, Patient/Family education, Joint mobilization, Vestibular  training, Orthotic/Fit training, DME instructions, and Manual therapy   PLAN FOR NEXT SESSION: Body-weight support gait training, Continue gait training in // bars. Stand pivot with bari RW. Provide handout of standing marches for HEP. Practice sit <>stand in // bars with wife. Lateral weight shifting in standing, rolling using PNF principles. Lateral scooting on mat, sit <>supine transfers, add strengthening to HEP as able. In therapy, use stedy for weight shifting, reaching,  trunk dissociation, sit <>stands in // bars.  He did so well seated in w/c facing mat, placing elbows on mat and lifting buttocks>coming to stand>weight shifting.  If we ever have +2A, I would love to try and get him prone.  He did not  have a great experience with it in the past, but UEs were not positioned well.    Cameron Sprang, PT, MPT [facility] [address] [place], [place], [address] Phone: [phone]   Fax:  [phone] 08/11/21, 12:02 PM

## 2021-08-13 ENCOUNTER — Encounter: Payer: Medicare Other | Admitting: Occupational Therapy

## 2021-08-13 ENCOUNTER — Ambulatory Visit: Payer: Medicare Other | Admitting: Physical Therapy

## 2021-08-13 DIAGNOSIS — R278 Other lack of coordination: Secondary | ICD-10-CM | POA: Diagnosis not present

## 2021-08-13 DIAGNOSIS — M6281 Muscle weakness (generalized): Secondary | ICD-10-CM

## 2021-08-13 DIAGNOSIS — R2689 Other abnormalities of gait and mobility: Secondary | ICD-10-CM

## 2021-08-13 DIAGNOSIS — R2681 Unsteadiness on feet: Secondary | ICD-10-CM

## 2021-08-13 NOTE — Therapy (Signed)
OUTPATIENT PHYSICAL THERAPY TREATMENT NOTE  Patient Name: Anthony Downs MRN: [id number] DOB:02/14/1950, 71 y.o., male Today's Date: 08/13/2021  PCP: Redmond School, MD REFERRING PROVIDER: [name], MD          PT End of Session - 08/13/21 1232     Visit Number 34    Number of Visits 43   per updated POC   Date for PT Re-Evaluation 40/98/11   Recert   Authorization Type Medicare and [place] (needs 10th visit progress note)    Progress Note Due on Visit 6    PT Start Time 61    PT Stop Time 1315    PT Time Calculation (min) 45 min    Equipment Utilized During Treatment Gait belt    Activity Tolerance Patient tolerated treatment well    Behavior During Therapy WFL for tasks assessed/performed                                Past Medical History:  Diagnosis Date   Acid reflux    Anxiety    Arthritis    Asthma    as a child   Basal cell carcinoma 01/26/2021   RIGHT POST SURICULAR INFERIOR   Basal cell carcinoma 01/26/2021   RIGHT POST AURICULAR SUPERIOR   Benign brain tumor ([place])    Cancer ([place])    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, squamous behind ear   Complication of anesthesia    slow to wake up and pain medicines make him sick   GERD (gastroesophageal reflux disease)    Hypercholesteremia    Hypertension    PONV (postoperative nausea and vomiting)    Sleep apnea    no Cpap   Subdural hematoma, 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   fell off truck hit head on concrete   Past Surgical History:  Procedure Laterality Date   APPLICATION OF CRANIAL NAVIGATION Left 04/29/2020   Procedure: [place];  Surgeon: Judith Part, MD;  Location: [place];  Service: Neurosurgery;  Laterality: Left;   arthroscopic knee     COLONOSCOPY N/A 11/29/2018   Procedure: COLONOSCOPY;  Surgeon: Rogene Houston, MD;  [facility];  Service: Endoscopy;  Laterality: N/A;  730-rescheduled 9/2 same time per Ann    CRANIOTOMY Left 04/29/2020   Procedure: LEFT CRANIECTOMY WITH TUMOR EXCISION;  Surgeon: Judith Part, MD;  Location: [place];  Service: Neurosurgery;  Laterality: Left;   KNEE SURGERY Right    NASAL SEPTOPLASTY W/ TURBINOPLASTY Bilateral 03/19/2020   Procedure: NASAL SEPTOPLASTY WITH BILATERAL  TURBINATE REDUCTION;  Surgeon: Leta Baptist, MD;  Location: [place] [facility];  Service: ENT;  Laterality: Bilateral;   VASECTOMY     Patient Active Problem List   Diagnosis Date Noted   Seizure ([place]) 08/13/2020   Seizures ([place]) 08/12/2020   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   DVT (deep venous thrombosis) ([place]) 08/12/2020   COVID-19 virus infection 08/12/2020   Acute lower UTI 07/14/2020   Delirium 07/06/2020   Sleep apnea 07/06/2020   GERD (gastroesophageal reflux disease) 07/06/2020   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[place]) 06/25/2020   Sundowning 06/25/2020   Slow transit constipation    Essential hypertension    Hypokalemia    Postoperative pain    Meningioma ([place]) 05/02/2020   Brain tumor ([place]) 04/28/2020   S/P nasal septoplasty 03/19/2020   Special screening for malignant neoplasms, colon 01/09/2018    REFERRING DIAG: D32.9 (ICD-10-CM) -  Meningioma ([facility]) G82.50 (ICD-10-CM) - 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[place]) R56.9 (ICD-10-CM) - Seizures ([place])    THERAPY DIAG:  Unsteadiness on feet  Other abnormalities of gait and mobility  Muscle weakness (generalized)  PERTINENT HISTORY: anxiety, OA, cancer, subdural hematoma     PRECAUTIONS: Fall  SUBJECTIVE: Pt reports he is doing well, no changes to report. Still working on his exercises at home but has not stood with the RW as much as the [place]   PAIN:  Are you having pain? 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 There are mild chronic small-vessel ischemic changes of the white matter. There is atrophy, encephalomalacia and gliosis along the medial aspects  of both sides of the brain at the frontoparietal vertex, sequela of the previous tumor and subsequent resection. It appears that the midportion of the superior sagittal sinus has been resected. No evidence of tumor in the venous sinuses anterior or posterior to that.  From MRI on 5/21: Redemonstrated sequela of prior vertex craniotomy/cranioplasty and parafalcine meningioma resection with partial resection of the superior sagittal sinus. No evidence of residual/recurrent tumor at the resection site.   Chronic intracranial findings without interval change, as described.   Paranasal sinus disease, as outlined.       TODAY'S TREATMENT:   NMR  -Sit <>stand from chair to bariatric RW w/min A for glute extension and upright posture. Wife assisting by stabilizing RW. Attempted to perform stand pivot to L side but pt unable to weight shift laterally to L side to offload RLE to move it even w/shoe cover. Recruited second therapist to assist in safe stand <>sit to chair as pt unable to perform transfer safely, mod A x2 for safe sit.  -Sit <>stand from [facility] to RW w/min-mod A x2 due to poor foot placement and poor trunk control. Once standing, attempted to have pt slide RLE forward and retro to prime lateral weight shifting and progressing RLE w/gait, but pt unable to perform even w/max A and shoe cover.  -Final sit <>stand w/min A provided by wife only, noted significant anterior weight shift and high amplitude movement to stand. Pt able to hold stand for 2 minutes and self-corrected posture without need for verbal cues.  -Lengthy discussion regarding importance of practicing lateral weight shifting and upright tolerance for improved carryover at functional strength in clinic. Pt and wife verbalized understanding.   PATIENT EDUCATION: Education details: Continue HEP Person educated: Patient and Spouse Education method: Explanation, Demonstration, Corporate treasurer cues, and Verbal cues Education comprehension:  verbalized understanding and needs further education   HOME EXERCISE PROGRAM: [email or web address]  Access Code: [email or web address] URL: [email or web address] Date: 07/30/2021 Prepared by: Mickie Bail Charene Mccallister  Exercises - Modified Thomas Stretch  - 1 x daily - 7 x weekly - 3 sets - 4 reps - 45-60second hold - Supine Bridge  - 1 x daily - 7 x weekly - 3 sets - 10 reps   ASSESSMENT:   CLINICAL IMPRESSION: Emphasis of skilled PT session on lateral weight shifting and transfers. Pt unable to perform sit <>stand pivot w/RW and max A x2 today due to poor lateral weightshifting and motor planning. Regressed to attempting fwd and retro stepping w/RLE but pt unable to perform. Informed pt on importance of practicing lateral shifting at home with walker to improve strength and comfortability w/movement, pt verbalized understanding. Continue POC.   OBJECTIVE IMPAIRMENTS decreased activity tolerance, decreased balance, decreased  coordination, decreased endurance, decreased knowledge of use of DME, decreased mobility, difficulty walking, decreased ROM, decreased strength, hypomobility, impaired perceived functional ability, impaired flexibility, impaired UE functional use, and postural dysfunction.    ACTIVITY LIMITATIONS cleaning, community activity, meal prep, laundry, and medication management.    PERSONAL FACTORS Time since onset of injury/illness/exacerbation and 3+ comorbidities: anxiety, OA, cancer, subdural hematoma  are also affecting patient's functional outcome.      REHAB POTENTIAL: Fair due to amount of time since onset and previous progress with PT   CLINICAL DECISION MAKING: Evolving/moderate complexity   EVALUATION COMPLEXITY: Moderate     GOALS: Goals reviewed with patient? Yes  OLD LONG TERM GOALS:  Target date: 08/20/2021  Pt/caregiver will be IND with final HEP in order to indicate improved functional mobility  and dec fall risk. Baseline:  Goal status: IN PROGRESS  2.  Pt will perform sit <>stand from 20" mat table to bariatric RW w/Min A for improved BLE strength and transfers  Baseline: Min A from 23" mat  Goal status: INITIAL  3.  Pt will ambulate 20' w/RW and body weight support as necessary w/mod A of single therapist for improved weightbearing tolerance and global strength  Baseline: 10' w/BW support and mod A x2  Goal status: INITIAL  NEW LONG TERM GOALS FOR EXTENDED POC:  Target date: 09/20/2021  Pt/caregiver will be IND with final HEP in order to indicate improved functional mobility and dec fall risk. Baseline:  Goal status: ONGOING  2.  Pt will perform sit <>stand from 20" mat table to bariatric RW at S level for improved BLE strength and transfers  Baseline: Min A from 23" mat  Goal status: REVISED  3.  Pt will ambulate 25' w/RW and body weight support as necessary w/mod A of single therapist for improved weightbearing tolerance and global strength  Baseline: 102' w/BW support and mod A x2  Goal status: ONGOING  4.  Pt will perform stand pivot transfer with bari RW (and LE bracing as needed) R and L at min A level in order to increase functional independence at home.   Baseline:   Goal Status: INITIAL   PLAN: PT FREQUENCY: 2x/week   PT DURATION: 5 weeks (recert)   PLANNED INTERVENTIONS: Therapeutic exercises, Therapeutic activity, Neuromuscular re-education, Balance training, Gait training, Patient/Family education, Joint mobilization, Vestibular training, Orthotic/Fit training, DME instructions, and Manual therapy   PLAN FOR NEXT SESSION: Body-weight support gait training, Continue gait training in // bars. Stand pivot with bari RW. Provide handout of standing marches for HEP. Practice sit <>stand in // bars with wife. Lateral weight shifting in standing, rolling using PNF principles. Lateral scooting on mat, sit <>supine transfers, add strengthening to HEP as able. In  therapy, use stedy for weight shifting, reaching,  trunk dissociation, sit <>stands in // bars.  He did so well seated in w/c facing mat, placing elbows on mat and lifting buttocks>coming to stand>weight shifting.  If we ever have +2A, I would love to try and get him prone.  He did not have a great experience with it in the past, but UEs were not positioned well.    Charlett Nose, PT, [facility] [address] [place],   [address] Phone:  [phone] Fax:  [phone] 08/13/21, 2:54 PM

## 2021-08-19 ENCOUNTER — Ambulatory Visit: Payer: Medicare Other | Admitting: Physical Therapy

## 2021-08-19 ENCOUNTER — Ambulatory Visit: Payer: Medicare Other | Attending: Internal Medicine | Admitting: Occupational Therapy

## 2021-08-19 DIAGNOSIS — R208 Other disturbances of skin sensation: Secondary | ICD-10-CM | POA: Diagnosis present

## 2021-08-19 DIAGNOSIS — R278 Other lack of coordination: Secondary | ICD-10-CM | POA: Insufficient documentation

## 2021-08-19 DIAGNOSIS — M25611 Stiffness of right shoulder, not elsewhere classified: Secondary | ICD-10-CM | POA: Insufficient documentation

## 2021-08-19 DIAGNOSIS — M6281 Muscle weakness (generalized): Secondary | ICD-10-CM | POA: Diagnosis present

## 2021-08-19 DIAGNOSIS — R293 Abnormal posture: Secondary | ICD-10-CM | POA: Diagnosis present

## 2021-08-19 DIAGNOSIS — R2689 Other abnormalities of gait and mobility: Secondary | ICD-10-CM | POA: Diagnosis present

## 2021-08-19 DIAGNOSIS — M25621 Stiffness of right elbow, not elsewhere classified: Secondary | ICD-10-CM | POA: Insufficient documentation

## 2021-08-19 DIAGNOSIS — R29898 Other symptoms and signs involving the musculoskeletal system: Secondary | ICD-10-CM | POA: Insufficient documentation

## 2021-08-19 DIAGNOSIS — R2681 Unsteadiness on feet: Secondary | ICD-10-CM | POA: Diagnosis present

## 2021-08-19 DIAGNOSIS — R251 Tremor, unspecified: Secondary | ICD-10-CM | POA: Insufficient documentation

## 2021-08-19 NOTE — Therapy (Signed)
OUTPATIENT PHYSICAL THERAPY TREATMENT NOTE  Patient Name: Anthony Downs MRN: [id number] DOB:February 23, 1950, 71 y.o., male Today's Date: 08/19/2021  PCP: Redmond School, MD REFERRING PROVIDER: [name], MD     PT End of Session - 08/19/21 1240     Visit Number 35    Number of Visits 43   per updated POC   Date for PT Re-Evaluation 65/03/54   Recert   Authorization Type Medicare and [place] (needs 10th visit progress note)    Progress Note Due on Visit 79    PT Start Time 1148    PT Stop Time 1230    PT Time Calculation (min) 42 min    Equipment Utilized During Treatment Gait belt    Activity Tolerance Patient tolerated treatment well    Behavior During Therapy WFL for tasks assessed/performed              Past Medical History:  Diagnosis Date   Acid reflux    Anxiety    Arthritis    Asthma    as a child   Basal cell carcinoma 01/26/2021   RIGHT POST SURICULAR INFERIOR   Basal cell carcinoma 01/26/2021   RIGHT POST AURICULAR SUPERIOR   Benign brain tumor ([place])    Cancer ([place])    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, squamous behind ear   Complication of anesthesia    slow to wake up and pain medicines make him sick   GERD (gastroesophageal reflux disease)    Hypercholesteremia    Hypertension    PONV (postoperative nausea and vomiting)    Sleep apnea    no Cpap   Subdural hematoma, 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   fell off truck hit head on concrete   Past Surgical History:  Procedure Laterality Date   APPLICATION OF CRANIAL NAVIGATION Left 04/29/2020   Procedure: [place];  Surgeon: Judith Part, MD;  Location: [place];  Service: Neurosurgery;  Laterality: Left;   arthroscopic knee     COLONOSCOPY N/A 11/29/2018   Procedure: COLONOSCOPY;  Surgeon: Rogene Houston, MD;  [facility];  Service: Endoscopy;  Laterality: N/A;  730-rescheduled 9/2 same time per Ann   CRANIOTOMY Left 04/29/2020   Procedure: LEFT  CRANIECTOMY WITH TUMOR EXCISION;  Surgeon: Judith Part, MD;  Location: [place];  Service: Neurosurgery;  Laterality: Left;   KNEE SURGERY Right    NASAL SEPTOPLASTY W/ TURBINOPLASTY Bilateral 03/19/2020   Procedure: NASAL SEPTOPLASTY WITH BILATERAL  TURBINATE REDUCTION;  Surgeon: Leta Baptist, MD;  Location: [facility];  Service: ENT;  Laterality: Bilateral;   VASECTOMY     Patient Active Problem List   Diagnosis Date Noted   Seizure ([place]) 08/13/2020   Seizures ([place]) 08/12/2020   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   DVT (deep venous thrombosis) ([place]) 08/12/2020   COVID-19 virus infection 08/12/2020   Acute lower UTI 07/14/2020   Delirium 07/06/2020   Sleep apnea 07/06/2020   GERD (gastroesophageal reflux disease) 07/06/2020   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[place]) 06/25/2020   Sundowning 06/25/2020   Slow transit constipation    Essential hypertension    Hypokalemia    Postoperative pain    Meningioma ([place]) 05/02/2020   Brain tumor ([place]) 04/28/2020   S/P nasal septoplasty 03/19/2020   Special screening for malignant neoplasms, colon 01/09/2018    REFERRING DIAG: D32.9 (ICD-10-CM) - Meningioma ([place]) G82.50 (ICD-10-CM) - 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[place]) R56.9 (ICD-10-CM) - Seizures ([place])    THERAPY DIAG:  Muscle weakness (generalized)  Unsteadiness  on feet  Other abnormalities of gait and mobility   PERTINENT HISTORY: anxiety, OA, cancer, subdural hematoma     PRECAUTIONS: Fall  SUBJECTIVE: Pt reports he is doing well, no changes to report. Still working on his exercises at home but has not stood with the RW as much as the [place] unless his wife has assist +2 for safety with standing to RW.  PAIN:  Are you having pain? 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 There are mild chronic small-vessel ischemic changes of the white matter. There is atrophy, encephalomalacia and gliosis along  the medial aspects of both sides of the brain at the frontoparietal vertex, sequela of the previous tumor and subsequent resection. It appears that the midportion of the superior sagittal sinus has been resected. No evidence of tumor in the venous sinuses anterior or posterior to that.  From MRI on 5/21: Redemonstrated sequela of prior vertex craniotomy/cranioplasty and parafalcine meningioma resection with partial resection of the superior sagittal sinus. No evidence of residual/recurrent tumor at the resection site.   Chronic intracranial findings without interval change, as described.   Paranasal sinus disease, as outlined.       TODAY'S TREATMENT:    NMR: -Suggested pt try slide board transfer to mat table, pt and wife report he can perform scoot transfer with greater ease that slide board transfer. Scoot transfer PWC to level mat table with mod A with increased time needed between scoots, manual and verbal cues for anterior weight shift and head/hips relationship during transfer.  -Sit to stand from elevated mat table with CGA to min A +2 to bari RW,  -standing LLE fwd/back step with RW and min to mod A for balance x 10 reps -sit to stand with 2" step under RLE to encourage weight shift to the L, utilized gait belt to lift RLE with minimal success of clearing LE from step -standing lateral L/R weight shift with use of mirror for visual feedback and tactile cues at hips -cues for upright trunk and hip extension for upright posture  -Stedy transfer back to [place] at end of session  PATIENT EDUCATION: Education details: Continue HEP and importance of working on standing as much as able Person educated: Patient and Spouse Education method: Explanation, Media planner, Corporate treasurer cues, and Verbal cues Education comprehension: verbalized understanding and needs further education   HOME EXERCISE  PROGRAM: [email or web address]  Access Code: [email or web address] URL: [email or web address] Date: 07/30/2021 Prepared by: Mickie Bail Plaster  Exercises - Modified Thomas Stretch  - 1 x daily - 7 x weekly - 3 sets - 4 reps - 45-60second hold - Supine Bridge  - 1 x daily - 7 x weekly - 3 sets - 10 reps   ASSESSMENT:   CLINICAL IMPRESSION: Emphasis of skilled PT session on standing posture and lateral weight shifting in standing. Pt continues to require multimodal cues for upright posture for upright trunk and hip extension. Pt also requires cues to increase weight shift off RLE to the L and still exhibits difficulty lifting RLE in standing. Pt will benefit from continued PT services to address mobility impairments. Continue POC.   OBJECTIVE IMPAIRMENTS decreased activity tolerance, decreased balance, decreased coordination, decreased endurance, decreased knowledge of use of DME, decreased mobility, difficulty walking, decreased ROM, decreased strength, hypomobility, impaired perceived functional ability, impaired flexibility, impaired UE functional use, and postural dysfunction.    ACTIVITY LIMITATIONS cleaning,  community activity, meal prep, laundry, and medication management.    PERSONAL FACTORS Time since onset of injury/illness/exacerbation and 3+ comorbidities: anxiety, OA, cancer, subdural hematoma  are also affecting patient's functional outcome.      REHAB POTENTIAL: Fair due to amount of time since onset and previous progress with PT   CLINICAL DECISION MAKING: Evolving/moderate complexity   EVALUATION COMPLEXITY: Moderate     GOALS: Goals reviewed with patient? Yes  OLD LONG TERM GOALS:  Target date: 08/20/2021  Pt/caregiver will be IND with final HEP in order to indicate improved functional mobility and dec fall risk. Baseline:  Goal status: IN PROGRESS  2.  Pt will perform sit <>stand from 20" mat table to  bariatric RW w/Min A for improved BLE strength and transfers  Baseline: Min A from 23" mat  Goal status: INITIAL  3.  Pt will ambulate 20' w/RW and body weight support as necessary w/mod A of single therapist for improved weightbearing tolerance and global strength  Baseline: 10' w/BW support and mod A x2  Goal status: INITIAL  NEW LONG TERM GOALS FOR EXTENDED POC:  Target date: 09/20/2021  Pt/caregiver will be IND with final HEP in order to indicate improved functional mobility and dec fall risk. Baseline:  Goal status: ONGOING  2.  Pt will perform sit <>stand from 20" mat table to bariatric RW at S level for improved BLE strength and transfers  Baseline: Min A from 23" mat  Goal status: REVISED  3.  Pt will ambulate 25' w/RW and body weight support as necessary w/mod A of single therapist for improved weightbearing tolerance and global strength  Baseline: 76' w/BW support and mod A x2  Goal status: ONGOING  4.  Pt will perform stand pivot transfer with bari RW (and LE bracing as needed) R and L at min A level in order to increase functional independence at home.   Baseline:   Goal Status: INITIAL   PLAN: PT FREQUENCY: 2x/week   PT DURATION: 5 weeks (recert)   PLANNED INTERVENTIONS: Therapeutic exercises, Therapeutic activity, Neuromuscular re-education, Balance training, Gait training, Patient/Family education, Joint mobilization, Vestibular training, Orthotic/Fit training, DME instructions, and Manual therapy   PLAN FOR NEXT SESSION: Body-weight support gait training, Continue gait training in // bars. Stand pivot with bari RW. Provide handout of standing marches for HEP. Practice sit <>stand in // bars with wife. Lateral weight shifting in standing, rolling using PNF principles. Lateral scooting on mat, sit <>supine transfers, add strengthening to HEP as able. In therapy, use stedy for weight shifting, reaching,  trunk dissociation, sit <>stands in // bars.  He did so well seated  in w/c facing mat, placing elbows on mat and lifting buttocks>coming to stand>weight shifting.  If we ever have +2A, I would love to try and get him prone.  He did not have a great experience with it in the past, but UEs were not positioned well.     Excell Seltzer, PT, DPT, [facility] [address] [place], [place]  [address] Phone:  [phone] Fax:  [phone] 08/19/21, 12:42 PM

## 2021-08-19 NOTE — Therapy (Signed)
OUTPATIENT OCCUPATIONAL THERAPY TREATMENT   Patient Name: Anthony Downs MRN: [id number] DOB:04-Nov-1950, 71 y.o., male Today's Date: 08/19/2021  PCP: Redmond School, MD REFERRING PROVIDER: Alger Simons MD  END OF SESSION:   OT End of Session - 08/19/21 1139     Visit Number 34    Number of Visits 54    Date for OT Re-Evaluation 10/22/21    Authorization Type Medicare and UHC    Authorization Time Period UHC VL - Need medical auth after 30 visits    Progress Note Due on Visit 77    OT Start Time 1105    OT Stop Time 1144    OT Time Calculation (min) 39 min                  ONSET DATE: 04/28/20  REFERRING DIAG: Meningioma, Tetraplegia  THERAPY DIAG:  Muscle weakness (generalized)  Other lack of coordination  Other disturbances of skin sensation  Stiffness of right elbow, not elsewhere classified  Stiffness of right shoulder, not elsewhere classified  Other symptoms and signs involving the musculoskeletal system  Abnormal posture  Tremor  Other abnormalities of gait and mobility  Unsteadiness on feet     Past Medical History:  Diagnosis Date   Acid reflux    Anxiety    Arthritis    Asthma    as a child   Basal cell carcinoma 01/26/2021   RIGHT POST SURICULAR INFERIOR   Basal cell carcinoma 01/26/2021   RIGHT POST AURICULAR SUPERIOR   Benign brain tumor ([place])    Cancer ([place])    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, squamous behind ear   Complication of anesthesia    slow to wake up and pain medicines make him sick   GERD (gastroesophageal reflux disease)    Hypercholesteremia    Hypertension    PONV (postoperative nausea and vomiting)    Sleep apnea    no Cpap   Subdural hematoma, 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   fell off truck hit head on concrete   Past Surgical History:  Procedure Laterality Date   APPLICATION OF CRANIAL NAVIGATION Left 04/29/2020   Procedure: [place];  Surgeon: Judith Part, MD;   Location: [facility];  Service: Neurosurgery;  Laterality: Left;   arthroscopic knee     COLONOSCOPY N/A 11/29/2018   Procedure: COLONOSCOPY;  Surgeon: Rogene Houston, MD;  [facility];  Service: Endoscopy;  Laterality: N/A;  730-rescheduled 9/2 same time per Ann   CRANIOTOMY Left 04/29/2020   Procedure: LEFT CRANIECTOMY WITH TUMOR EXCISION;  Surgeon: Judith Part, MD;  Location: [facility];  Service: Neurosurgery;  Laterality: Left;   KNEE SURGERY Right    NASAL SEPTOPLASTY W/ TURBINOPLASTY Bilateral 03/19/2020   Procedure: NASAL SEPTOPLASTY WITH BILATERAL  TURBINATE REDUCTION;  Surgeon: Leta Baptist, MD;  Location: [facility];  Service: ENT;  Laterality: Bilateral;   VASECTOMY     Patient Active Problem List   Diagnosis Date Noted   Seizure ([place]) 08/13/2020   Seizures ([place]) 08/12/2020   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   DVT (deep venous thrombosis) ([place]) 08/12/2020   COVID-19 virus infection 08/12/2020   Acute lower UTI 07/14/2020   Delirium 07/06/2020   Sleep apnea 07/06/2020   GERD (gastroesophageal reflux disease) 07/06/2020   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[place]) 06/25/2020   Sundowning 06/25/2020   Slow transit constipation    Essential hypertension    Hypokalemia    Postoperative pain    Meningioma ([place]) 05/02/2020  Brain tumor ([place]) 04/28/2020   S/P nasal septoplasty 03/19/2020   Special screening for malignant neoplasms, colon 01/09/2018    ONSET DATE: 04/28/20  REFERRING DIAG: Meningioma, Tetraplegia  PERTINENT HISTORY: traumatic SDH 2008  PRECAUTIONS: Fall Risk  SUBJECTIVE:   PAIN:  Are you having pain? No    TODAY'S TREATMENT:    mod A for sit- stand, 2 person assist for safety.  Standing at tabletop to place large pegs into semicircle with RUE, min-mod  A/ facilitation  for balance and upright posture.  Placing medium and small pegs into semicircle with RUE in seated for increased fine motor coordination, mod difficulty with small pegs,   Tableslides with RUE  along incline for shoulder flexion, and abduction min facilitation, v.c  HOME EXERCISE PROGRAM: Encouraged to do table slides and pick up supine shoulder stretches/ROM exercises.  Issued 1/2 inch blocks for coordination  GOALS: Goals reviewed with patient? Yes   SHORT TERM GOALS:  Target Date: 08/20/21  1.  Patient and caregiver will complete HEP designed to improve passive and active range of motion in BUE  Baseline:  Goal status: ACHIEVED   2.  Patient will utilize RUE to assist with at least one ADL task with mod cueing and minimal assistance  Baseline:  Goal status: ACHIEVED   3.  Patient will reach at least mid calf toward feet with BUE in preparation for participating in LB dressing  Baseline:  Goal status: ACHIEVED   4. Patient will demonstrate sufficient lateral lean left or right in seated position to prepare for placement of sliding board to encourage more active transfers and increase potential for commode transfers.                          Baseline:                          Goal status: ACHIEVED  5. Pt will be educated on available AE and DME to assist with ADL completion as needed for improved independence.   Baseline:  Goal status: ACHIEVED  6. Patient will demonstrate improved coordination in RUE as evidenced by at least 13 blocks (box in blocks) in one minute   Baseline: 9 blocks at renewal 05/28/21  Goal status: IN PROGRESS - RUE 10 blocks       LONG TERM GOALS: Target date: 10/22/2021   1.  Patient will complete updated HEP designed to improve range, strength, and functional use of BUE  Baseline:  Goal status: ONGOING - continue updating HEPs PRN   2.  Patient will complete a level surface transfer with slide board PRN with no more than mod assistance in preparation for commode transfer  Baseline:  Goal status: ACHIEVED level transfer with min A   3.  Patient will demonstrate improved coordination in RUE as evidenced by at least 8 blocks (box in blocks)  in one minute  Baseline: 4 blocks Goal status: ACHIEVED 9 blocks   4.  Patient will demonstrate a 4lb increase in grip strength in RUE  Baseline: 6.1  Goal status: Achieved 5/31 - 12.9lb    5. Patient will complete at least 30% more of bathing and dressing tasks   Baseline:  Goal status: ACHIEVED  per pt wife-60-70%  6. Pt will assist with clothing management and hygiene with unilateral support with min A with toileting.  Baseline: Goal Status: ACHIEVED - 7/13 assisting with bathing and LB dressing,  assisting with clothing but not hygiene   7. Pt will increase range of motion in AROM in RUE shoulder flexion by at least 10 degrees for increasing ability to use right, dominant, hand, for more functional reaching tasks and ADLs.  Baseline: 70 degrees  Goal Status: ACHIEVED 7/13 RUE shoulder flexion 85*  8. Pt will stand with rolling walker for at least 3 minutes without physical assistance for caregiver to assist with clothing management and hygiene to progress towards increased independence with toileting.   Baseline: currently beginning to stand with rolling walker  [place] status: INITIAL  9. Patient will demonstrate improved coordination in RUE as evidenced by at least 15 blocks (box in blocks) in one minute   Baseline: 9 blocks at renewal 05/28/21  Goal status: IN PROGRESS - RUE 10 blocks  10. Pt will increase range of motion in AROM in RUE shoulder flexion to at least 90 degrees for increasing ability to use right, dominant, hand, for more functional reaching tasks and ADLs.  Baseline: 85 degrees AT RECERT  Goal Status: INITIAL       Plan     Clinical Impression Statement 08/19/21 Pt continues to progress towards goals with increased functional use with RUE and standing.   OT Occupational Profile and History Detailed Assessment- Review of Records and additional review of physical, cognitive, psychosocial history related to current functional performance    Occupational  performance deficits (Please refer to evaluation for details): ADL's;IADL's;Leisure    Body Structure / Function / Physical Skills ADL;Decreased knowledge of use of DME;Gait;Obesity;Strength;Tone;GMC;Dexterity;Balance;Body mechanics;Edema;Proprioception;UE functional use;Cardiopulmonary status limiting activity;Endurance;IADL;ROM;Continence;Coordination;Flexibility;Mobility;Sensation;FMC;Decreased knowledge of precautions;Muscle spasms    Rehab Potential Good    Clinical Decision Making Several treatment options, min-mod task modification necessary    Comorbidities Affecting Occupational Performance: May have comorbidities impacting occupational performance    Modification or Assistance to Complete Evaluation  Min-Moderate modification of tasks or assist with assess necessary to complete eval    OT Frequency 2x / week    OT Duration 12 weeks    OT Treatment/Interventions Self-care/ADL training;Aquatic Therapy;DME and/or AE instruction;Splinting;Balance training;Therapeutic activities;Therapeutic exercise;Cognitive remediation/compensation;Passive range of motion;Functional Mobility Training;Neuromuscular education;Electrical Stimulation;Energy conservation;Patient/family education;Manual Therapy    Plan Continue towards new and updated goals from recertification, standing tolerance with rolling walker, BUE ROM and strengthening, coordination for RUE.    Consulted and Agree with Plan of Care Patient;Family member/caregiver    Family Member Consulted wife             Theone Murdoch, OT 08/19/2021, 11:39 AM

## 2021-08-21 ENCOUNTER — Ambulatory Visit: Payer: Medicare Other | Admitting: Physical Therapy

## 2021-08-21 ENCOUNTER — Ambulatory Visit: Payer: Medicare Other | Admitting: Occupational Therapy

## 2021-08-21 DIAGNOSIS — R278 Other lack of coordination: Secondary | ICD-10-CM

## 2021-08-21 DIAGNOSIS — R208 Other disturbances of skin sensation: Secondary | ICD-10-CM

## 2021-08-21 DIAGNOSIS — R2681 Unsteadiness on feet: Secondary | ICD-10-CM

## 2021-08-21 DIAGNOSIS — R29898 Other symptoms and signs involving the musculoskeletal system: Secondary | ICD-10-CM

## 2021-08-21 DIAGNOSIS — M25621 Stiffness of right elbow, not elsewhere classified: Secondary | ICD-10-CM

## 2021-08-21 DIAGNOSIS — M6281 Muscle weakness (generalized): Secondary | ICD-10-CM

## 2021-08-21 DIAGNOSIS — R293 Abnormal posture: Secondary | ICD-10-CM

## 2021-08-21 DIAGNOSIS — M25611 Stiffness of right shoulder, not elsewhere classified: Secondary | ICD-10-CM

## 2021-08-21 DIAGNOSIS — R2689 Other abnormalities of gait and mobility: Secondary | ICD-10-CM

## 2021-08-21 LAB — URINALYSIS, ROUTINE W REFLEX MICROSCOPIC
Bilirubin, UA: NEGATIVE
Glucose, UA: NEGATIVE
Leukocytes,UA: NEGATIVE
Nitrite, UA: NEGATIVE
Protein,UA: NEGATIVE
RBC, UA: NEGATIVE
Specific Gravity, UA: 1.025 (ref 1.005–1.030)
Urobilinogen, Ur: 0.2 mg/dL (ref 0.2–1.0)
pH, UA: 5.5 (ref 5.0–7.5)

## 2021-08-21 NOTE — Therapy (Signed)
OUTPATIENT PHYSICAL THERAPY TREATMENT NOTE  Patient Name: Anthony Downs MRN: [id number] DOB:28-Jan-1950, 71 y.o., male Today's Date: 08/21/2021  PCP: Redmond School, MD REFERRING PROVIDER: [name], MD     PT End of Session - 08/21/21 1146     Visit Number 36    Number of Visits 43   per updated POC   Date for PT Re-Evaluation 42/59/56   Recert   Authorization Type Medicare and [place] (needs 10th visit progress note)    Progress Note Due on Visit 65    PT Start Time 1146    PT Stop Time 1228    PT Time Calculation (min) 42 min    Equipment Utilized During Treatment Gait belt    Activity Tolerance Patient tolerated treatment well    Behavior During Therapy WFL for tasks assessed/performed               Past Medical History:  Diagnosis Date   Acid reflux    Anxiety    Arthritis    Asthma    as a child   Basal cell carcinoma 01/26/2021   RIGHT POST SURICULAR INFERIOR   Basal cell carcinoma 01/26/2021   RIGHT POST AURICULAR SUPERIOR   Benign brain tumor ([place])    Cancer ([place])    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, squamous behind ear   Complication of anesthesia    slow to wake up and pain medicines make him sick   GERD (gastroesophageal reflux disease)    Hypercholesteremia    Hypertension    PONV (postoperative nausea and vomiting)    Sleep apnea    no Cpap   Subdural hematoma, 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   fell off truck hit head on concrete   Past Surgical History:  Procedure Laterality Date   APPLICATION OF CRANIAL NAVIGATION Left 04/29/2020   Procedure: [place];  Surgeon: Judith Part, MD;  Location: [facility];  Service: Neurosurgery;  Laterality: Left;   arthroscopic knee     COLONOSCOPY N/A 11/29/2018   Procedure: COLONOSCOPY;  Surgeon: Rogene Houston, MD;  [facility];  Service: Endoscopy;  Laterality: N/A;  730-rescheduled 9/2 same time per Ann   CRANIOTOMY Left 04/29/2020   Procedure: LEFT  CRANIECTOMY WITH TUMOR EXCISION;  Surgeon: Judith Part, MD;  Location: [facility];  Service: Neurosurgery;  Laterality: Left;   KNEE SURGERY Right    NASAL SEPTOPLASTY W/ TURBINOPLASTY Bilateral 03/19/2020   Procedure: NASAL SEPTOPLASTY WITH BILATERAL  TURBINATE REDUCTION;  Surgeon: Leta Baptist, MD;  Location: [facility];  Service: ENT;  Laterality: Bilateral;   VASECTOMY     Patient Active Problem List   Diagnosis Date Noted   Seizure ([place]) 08/13/2020   Seizures ([place]) 08/12/2020   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   DVT (deep venous thrombosis) ([place]) 08/12/2020   COVID-19 virus infection 08/12/2020   Acute lower UTI 07/14/2020   Delirium 07/06/2020   Sleep apnea 07/06/2020   GERD (gastroesophageal reflux disease) 07/06/2020   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[place]) 06/25/2020   Sundowning 06/25/2020   Slow transit constipation    Essential hypertension    Hypokalemia    Postoperative pain    Meningioma ([place]) 05/02/2020   Brain tumor ([place]) 04/28/2020   S/P nasal septoplasty 03/19/2020   Special screening for malignant neoplasms, colon 01/09/2018    REFERRING DIAG: D32.9 (ICD-10-CM) - Meningioma ([place]) G82.50 (ICD-10-CM) - 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[place]) R56.9 (ICD-10-CM) - Seizures ([place])    THERAPY DIAG:  Muscle weakness (generalized)  Unsteadiness on feet  Other abnormalities of gait and mobility   PERTINENT HISTORY: anxiety, OA, cancer, subdural hematoma     PRECAUTIONS: Fall  SUBJECTIVE: Pt's wife reports he has been standing with stedy and working on weight shifting, has not been standing to RW.  PAIN:  Are you having pain? 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 There are mild chronic small-vessel ischemic changes of the white matter. There is atrophy, encephalomalacia and gliosis along the medial aspects of both sides of the brain at the frontoparietal vertex, sequela of the  previous tumor and subsequent resection. It appears that the midportion of the superior sagittal sinus has been resected. No evidence of tumor in the venous sinuses anterior or posterior to that.  From MRI on 5/21: Redemonstrated sequela of prior vertex craniotomy/cranioplasty and parafalcine meningioma resection with partial resection of the superior sagittal sinus. No evidence of residual/recurrent tumor at the resection site.   Chronic intracranial findings without interval change, as described.   Paranasal sinus disease, as outlined.       TODAY'S TREATMENT:    NMR: Sit to stand with CGA from mat table to stedy, stedy transfer to [place]. PWC into // bars. Sit to stand in // bars with CGA to min A, use of mirror for visual feedback for upright posture, increased weight shift onto RLE, and decreased rotation of hips and trunk.  Standing balance in // bars with one UE support while reaching outside BOS and across midline with alt LUE and RUE for targets with min A for balance and assist from a 2nd person for holding cones. Pt exhibits intention tremor in RUE when reaching for cones. Pt fatigues very quickly with task and able to retrieve 5-7 cones before needing a seated rest break to recover.  Standing forward/backward step with LLE in // bars with min A for balance, multimodal cueing for body positioning and lateral weight shift. Pt able to take one step forward with RLE with shoe cover on, gait belt wrapped around RLE, and total A from therapist to advance limb. Pt unable to take a step backwards with RLE.   PATIENT EDUCATION: Education details: Continue HEP and importance of working on standing as much as able Person educated: Patient and Spouse Education method: Explanation, Demonstration, Corporate treasurer cues, and Verbal cues Education comprehension: verbalized understanding and needs further education   HOME EXERCISE  PROGRAM: [email or web address]  Access Code: [email or web address] URL: [email or web address] Date: 07/30/2021 Prepared by: Mickie Bail Plaster  Exercises - Modified Thomas Stretch  - 1 x daily - 7 x weekly - 3 sets - 4 reps - 45-60second hold - Supine Bridge  - 1 x daily - 7 x weekly - 3 sets - 10 reps   ASSESSMENT:   CLINICAL IMPRESSION: Emphasis of skilled PT session on static standing in // bars with weight shifting through LE and reaching with alt UE for rotation and maintenance of standing balance with decreased UE support. Also attempted to work on taking steps in // bars but pt continues to exhibit difficulty with advancing RLE. Pt will benefit from continued skilled therapy services to address ongoing balance, strength, and coordination deficits. Continue POC.    OBJECTIVE IMPAIRMENTS decreased activity tolerance, decreased balance, decreased coordination, decreased endurance, decreased knowledge of use of DME, decreased mobility, difficulty walking, decreased ROM, decreased strength, hypomobility, impaired perceived functional ability, impaired flexibility, impaired UE functional  use, and postural dysfunction.    ACTIVITY LIMITATIONS cleaning, community activity, meal prep, laundry, and medication management.    PERSONAL FACTORS Time since onset of injury/illness/exacerbation and 3+ comorbidities: anxiety, OA, cancer, subdural hematoma  are also affecting patient's functional outcome.      REHAB POTENTIAL: Fair due to amount of time since onset and previous progress with PT   CLINICAL DECISION MAKING: Evolving/moderate complexity   EVALUATION COMPLEXITY: Moderate     GOALS: Goals reviewed with patient? Yes  OLD LONG TERM GOALS:  Target date: 08/20/2021  Pt/caregiver will be IND with final HEP in order to indicate improved functional mobility and dec fall risk. Baseline:  Goal status: IN PROGRESS  2.  Pt will  perform sit <>stand from 20" mat table to bariatric RW w/Min A for improved BLE strength and transfers  Baseline: Min A from 23" mat  Goal status: INITIAL  3.  Pt will ambulate 20' w/RW and body weight support as necessary w/mod A of single therapist for improved weightbearing tolerance and global strength  Baseline: 10' w/BW support and mod A x2  Goal status: INITIAL  NEW LONG TERM GOALS FOR EXTENDED POC:  Target date: 09/20/2021  Pt/caregiver will be IND with final HEP in order to indicate improved functional mobility and dec fall risk. Baseline:  Goal status: ONGOING  2.  Pt will perform sit <>stand from 20" mat table to bariatric RW at S level for improved BLE strength and transfers  Baseline: Min A from 23" mat  Goal status: REVISED  3.  Pt will ambulate 25' w/RW and body weight support as necessary w/mod A of single therapist for improved weightbearing tolerance and global strength  Baseline: 46' w/BW support and mod A x2  Goal status: ONGOING  4.  Pt will perform stand pivot transfer with bari RW (and LE bracing as needed) R and L at min A level in order to increase functional independence at home.   Baseline:   Goal Status: INITIAL   PLAN: PT FREQUENCY: 2x/week   PT DURATION: 5 weeks (recert)   PLANNED INTERVENTIONS: Therapeutic exercises, Therapeutic activity, Neuromuscular re-education, Balance training, Gait training, Patient/Family education, Joint mobilization, Vestibular training, Orthotic/Fit training, DME instructions, and Manual therapy   PLAN FOR NEXT SESSION: Body-weight support gait training, Continue gait training in // bars. Stand pivot with bari RW. Provide handout of standing marches for HEP. Practice sit <>stand in // bars with wife. Lateral weight shifting in standing, rolling using PNF principles. Lateral scooting on mat, sit <>supine transfers, add strengthening to HEP as able. In therapy, use stedy for weight shifting, reaching,  trunk dissociation, sit  <>stands in // bars.  He did so well seated in w/c facing mat, placing elbows on mat and lifting buttocks>coming to stand>weight shifting.  If we ever have +2A, I would love to try and get him prone.  He did not have a great experience with it in the past, but UEs were not positioned well.       Excell Seltzer, PT, DPT, [facility] [address] [place], [place]  [address] Phone:  [phone] Fax:  [phone] 08/21/21, 12:29 PM

## 2021-08-21 NOTE — Therapy (Signed)
OUTPATIENT OCCUPATIONAL THERAPY TREATMENT   Patient Name: Anthony Downs MRN: [id number] DOB:17-Sep-1950, 71 y.o., male Today's Date: 08/21/2021  PCP: Redmond School, MD REFERRING PROVIDER: Alger Simons MD  END OF SESSION:   OT End of Session - 08/21/21 1221     Visit Number 35    Number of Visits 54    Date for OT Re-Evaluation 10/22/21    Authorization Type Medicare and UHC- need KX    Authorization Time Period UHC VL - Need medical auth after 30 visits    Progress Note Due on Visit 58    OT Start Time 1105    OT Stop Time 1145    OT Time Calculation (min) 40 min    Activity Tolerance Patient tolerated treatment well    Behavior During Therapy WFL for tasks assessed/performed                   ONSET DATE: 04/28/20  REFERRING DIAG: Meningioma, Tetraplegia  THERAPY DIAG:  Muscle weakness (generalized)  Unsteadiness on feet  Other abnormalities of gait and mobility  Other lack of coordination  Other disturbances of skin sensation  Stiffness of right elbow, not elsewhere classified  Stiffness of right shoulder, not elsewhere classified  Other symptoms and signs involving the musculoskeletal system  Abnormal posture     Past Medical History:  Diagnosis Date   Acid reflux    Anxiety    Arthritis    Asthma    as a child   Basal cell carcinoma 01/26/2021   RIGHT POST SURICULAR INFERIOR   Basal cell carcinoma 01/26/2021   RIGHT POST AURICULAR SUPERIOR   Benign brain tumor ([place])    Cancer ([place])    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, squamous behind ear   Complication of anesthesia    slow to wake up and pain medicines make him sick   GERD (gastroesophageal reflux disease)    Hypercholesteremia    Hypertension    PONV (postoperative nausea and vomiting)    Sleep apnea    no Cpap   Subdural hematoma, 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   fell off truck hit head on concrete   Past Surgical History:  Procedure Laterality Date   APPLICATION OF  CRANIAL NAVIGATION Left 04/29/2020   Procedure: [place];  Surgeon: Judith Part, MD;  Location: [facility];  Service: Neurosurgery;  Laterality: Left;   arthroscopic knee     COLONOSCOPY N/A 11/29/2018   Procedure: COLONOSCOPY;  Surgeon: Rogene Houston, MD;  [facility];  Service: Endoscopy;  Laterality: N/A;  730-rescheduled 9/2 same time per Ann   CRANIOTOMY Left 04/29/2020   Procedure: LEFT CRANIECTOMY WITH TUMOR EXCISION;  Surgeon: Judith Part, MD;  Location: [place];  Service: Neurosurgery;  Laterality: Left;   KNEE SURGERY Right    NASAL SEPTOPLASTY W/ TURBINOPLASTY Bilateral 03/19/2020   Procedure: NASAL SEPTOPLASTY WITH BILATERAL  TURBINATE REDUCTION;  Surgeon: Leta Baptist, MD;  Location: [place];  Service: ENT;  Laterality: Bilateral;   VASECTOMY     Patient Active Problem List   Diagnosis Date Noted   Seizure ([place]) 08/13/2020   Seizures ([place]) 08/12/2020   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   DVT (deep venous thrombosis) ([place]) 08/12/2020   COVID-19 virus infection 08/12/2020   Acute lower UTI 07/14/2020   Delirium 07/06/2020   Sleep apnea 07/06/2020   GERD (gastroesophageal reflux disease) 07/06/2020   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[place]) 06/25/2020   Sundowning 06/25/2020   Slow transit constipation  Essential hypertension    Hypokalemia    Postoperative pain    Meningioma ([place]) 05/02/2020   Brain tumor ([place]) 04/28/2020   S/P nasal septoplasty 03/19/2020   Special screening for malignant neoplasms, colon 01/09/2018    ONSET DATE: 04/28/20  REFERRING DIAG: Meningioma, Tetraplegia  PERTINENT HISTORY: traumatic SDH 2008  PRECAUTIONS: Fall Risk  SUBJECTIVE:   PAIN:  Are you having pain? No    TODAY'S TREATMENT:  Pt transferred scooting from W/C to mat with min A and increased time(level surface) Seated weightbearing through right elbow with body on arm movements, min facilitation Cane exercises for chest press and shoulder flexion  min v.c  A/ROM scapular retraction. Functional reach with trunk rotation to place cones on an elevated surface, min v.c  Hemiglide for AA/ROM shoulder flexion and abduction, min v.c     HOME EXERCISE PROGRAM: Encouraged to do table slides and pick up supine shoulder stretches/ROM exercises.  Issued 1/2 inch blocks for coordination  GOALS: Goals reviewed with patient? Yes   SHORT TERM GOALS:  Target Date: 08/20/21  1.  Patient and caregiver will complete HEP designed to improve passive and active range of motion in BUE  Baseline:  Goal status: ACHIEVED   2.  Patient will utilize RUE to assist with at least one ADL task with mod cueing and minimal assistance  Baseline:  Goal status: ACHIEVED   3.  Patient will reach at least mid calf toward feet with BUE in preparation for participating in LB dressing  Baseline:  Goal status: ACHIEVED   4. Patient will demonstrate sufficient lateral lean left or right in seated position to prepare for placement of sliding board to encourage more active transfers and increase potential for commode transfers.                          Baseline:                          Goal status: ACHIEVED  5. Pt will be educated on available AE and DME to assist with ADL completion as needed for improved independence.   Baseline:  Goal status: ACHIEVED  6. Patient will demonstrate improved coordination in RUE as evidenced by at least 13 blocks (box in blocks) in one minute   Baseline: 9 blocks at renewal 05/28/21  Goal status: IN PROGRESS - RUE 10 blocks       LONG TERM GOALS: Target date: 10/22/2021   1.  Patient will complete updated HEP designed to improve range, strength, and functional use of BUE  Baseline:  Goal status: ONGOING - continue updating HEPs PRN   2.  Patient will complete a level surface transfer with slide board PRN with no more than mod assistance in preparation for commode transfer  Baseline:  Goal status: ACHIEVED level transfer with  min A   3.  Patient will demonstrate improved coordination in RUE as evidenced by at least 8 blocks (box in blocks) in one minute  Baseline: 4 blocks Goal status: ACHIEVED 9 blocks   4.  Patient will demonstrate a 4lb increase in grip strength in RUE  Baseline: 6.1  Goal status: Achieved 5/31 - 12.9lb    5. Patient will complete at least 30% more of bathing and dressing tasks   Baseline:  Goal status: ACHIEVED  per pt wife-60-70%  6. Pt will assist with clothing management and hygiene with unilateral support with min A  with toileting.  Baseline: Goal Status: ACHIEVED - 7/13 assisting with bathing and LB dressing, assisting with clothing but not hygiene   7. Pt will increase range of motion in AROM in RUE shoulder flexion by at least 10 degrees for increasing ability to use right, dominant, hand, for more functional reaching tasks and ADLs.  Baseline: 70 degrees  Goal Status: ACHIEVED 7/13 RUE shoulder flexion 85*  8. Pt will stand with rolling walker for at least 3 minutes without physical assistance for caregiver to assist with clothing management and hygiene to progress towards increased independence with toileting.   Baseline: currently beginning to stand with rolling walker  Gaol status: INITIAL  9. Patient will demonstrate improved coordination in RUE as evidenced by at least 15 blocks (box in blocks) in one minute   Baseline: 9 blocks at renewal 05/28/21  Goal status: IN PROGRESS - RUE 10 blocks  10. Pt will increase range of motion in AROM in RUE shoulder flexion to at least 90 degrees for increasing ability to use right, dominant, hand, for more functional reaching tasks and ADLs.  Baseline: 85 degrees AT RECERT  Goal Status: INITIAL       Plan     Clinical Impression Statement 08/21/21 Pt continues to progress towards goals with improved transfers and RUE function.   OT Occupational Profile and History Detailed Assessment- Review of Records and additional review of  physical, cognitive, psychosocial history related to current functional performance    Occupational performance deficits (Please refer to evaluation for details): ADL's;IADL's;Leisure    Body Structure / Function / Physical Skills ADL;Decreased knowledge of use of DME;Gait;Obesity;Strength;Tone;GMC;Dexterity;Balance;Body mechanics;Edema;Proprioception;UE functional use;Cardiopulmonary status limiting activity;Endurance;IADL;ROM;Continence;Coordination;Flexibility;Mobility;Sensation;FMC;Decreased knowledge of precautions;Muscle spasms    Rehab Potential Good    Clinical Decision Making Several treatment options, min-mod task modification necessary    Comorbidities Affecting Occupational Performance: May have comorbidities impacting occupational performance    Modification or Assistance to Complete Evaluation  Min-Moderate modification of tasks or assist with assess necessary to complete eval    OT Frequency 2x / week    OT Duration 12 weeks    OT Treatment/Interventions Self-care/ADL training;Aquatic Therapy;DME and/or AE instruction;Splinting;Balance training;Therapeutic activities;Therapeutic exercise;Cognitive remediation/compensation;Passive range of motion;Functional Mobility Training;Neuromuscular education;Electrical Stimulation;Energy conservation;Patient/family education;Manual Therapy    Plan Continue towards new and updated goals  standing tolerance with rolling walker, BUE ROM and strengthening, coordination for RUE.    Consulted and Agree with Plan of Care Patient;Family member/caregiver    Family Member Consulted wife             Theone Murdoch, OT 08/21/2021, 12:25 PM

## 2021-08-25 ENCOUNTER — Ambulatory Visit: Payer: Medicare Other | Admitting: Physical Therapy

## 2021-08-25 ENCOUNTER — Ambulatory Visit: Payer: Medicare Other | Admitting: Occupational Therapy

## 2021-08-25 DIAGNOSIS — R278 Other lack of coordination: Secondary | ICD-10-CM

## 2021-08-25 DIAGNOSIS — M6281 Muscle weakness (generalized): Secondary | ICD-10-CM | POA: Diagnosis not present

## 2021-08-25 DIAGNOSIS — R208 Other disturbances of skin sensation: Secondary | ICD-10-CM

## 2021-08-25 DIAGNOSIS — R2689 Other abnormalities of gait and mobility: Secondary | ICD-10-CM

## 2021-08-25 DIAGNOSIS — R29898 Other symptoms and signs involving the musculoskeletal system: Secondary | ICD-10-CM

## 2021-08-25 DIAGNOSIS — R293 Abnormal posture: Secondary | ICD-10-CM

## 2021-08-25 DIAGNOSIS — M25611 Stiffness of right shoulder, not elsewhere classified: Secondary | ICD-10-CM

## 2021-08-25 DIAGNOSIS — R2681 Unsteadiness on feet: Secondary | ICD-10-CM

## 2021-08-25 DIAGNOSIS — M25621 Stiffness of right elbow, not elsewhere classified: Secondary | ICD-10-CM
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
## 2021-08-26 ENCOUNTER — Encounter: Payer: Self-pay | Admitting: Occupational Therapy

## 2021-08-26 ENCOUNTER — Ambulatory Visit: Payer: Medicare Other | Admitting: Occupational Therapy

## 2021-08-26 ENCOUNTER — Ambulatory Visit: Payer: Medicare Other | Admitting: Physical Therapy

## 2021-08-26 DIAGNOSIS — R208 Other disturbances of skin sensation: Secondary | ICD-10-CM

## 2021-08-26 DIAGNOSIS — R293 Abnormal posture: Secondary | ICD-10-CM

## 2021-08-26 DIAGNOSIS — R29898 Other symptoms and signs involving the musculoskeletal system: Secondary | ICD-10-CM

## 2021-08-26 DIAGNOSIS — R2681 Unsteadiness on feet: Secondary | ICD-10-CM

## 2021-08-26 DIAGNOSIS — R2689 Other abnormalities of gait and mobility: Secondary | ICD-10-CM

## 2021-08-26 DIAGNOSIS — R251 Tremor, unspecified: Secondary | ICD-10-CM

## 2021-08-26 DIAGNOSIS — M6281 Muscle weakness (generalized): Secondary | ICD-10-CM | POA: Diagnosis not present

## 2021-08-26 DIAGNOSIS — R278 Other lack of coordination: Secondary | ICD-10-CM

## 2021-08-26 DIAGNOSIS — M25621 Stiffness of right elbow, not elsewhere classified: Secondary | ICD-10-CM

## 2021-08-26 DIAGNOSIS — M25611 Stiffness of right shoulder, not elsewhere classified: Secondary | ICD-10-CM

## 2021-08-26 NOTE — Therapy (Signed)
OUTPATIENT PHYSICAL THERAPY TREATMENT NOTE  Patient Name: Anthony Downs MRN: [id number] DOB:06/20/1950, 71 y.o., male Today's Date: 08/26/2021  PCP: Redmond School, MD REFERRING PROVIDER: [name], MD     PT End of Session - 08/26/21 1318     Visit Number 38    Number of Visits 43   per updated POC   Date for PT Re-Evaluation 32/67/12   Recert   Authorization Type Medicare and [place] (needs 10th visit progress note)    Progress Note Due on Visit 16    PT Start Time 1317    PT Stop Time 1400    PT Time Calculation (min) 43 min    Equipment Utilized During Treatment Gait belt    Activity Tolerance Patient tolerated treatment well    Behavior During Therapy WFL for tasks assessed/performed                Past Medical History:  Diagnosis Date   Acid reflux    Anxiety    Arthritis    Asthma    as a child   Basal cell carcinoma 01/26/2021   RIGHT POST SURICULAR INFERIOR   Basal cell carcinoma 01/26/2021   RIGHT POST AURICULAR SUPERIOR   Benign brain tumor ([place])    Cancer ([place])    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, squamous behind ear   Complication of anesthesia    slow to wake up and pain medicines make him sick   GERD (gastroesophageal reflux disease)    Hypercholesteremia    Hypertension    PONV (postoperative nausea and vomiting)    Sleep apnea    no Cpap   Subdural hematoma, 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   fell off truck hit head on concrete   Past Surgical History:  Procedure Laterality Date   APPLICATION OF CRANIAL NAVIGATION Left 04/29/2020   Procedure: [place];  Surgeon: Judith Part, MD;  Location: [place] [facility];  Service: Neurosurgery;  Laterality: Left;   arthroscopic knee     COLONOSCOPY N/A 11/29/2018   Procedure: COLONOSCOPY;  Surgeon: Rogene Houston, MD;  [facility];  Service: Endoscopy;  Laterality: N/A;  730-rescheduled 9/2 same time per Ann   CRANIOTOMY Left 04/29/2020   Procedure: LEFT  CRANIECTOMY WITH TUMOR EXCISION;  Surgeon: Judith Part, MD;  Location: [facility];  Service: Neurosurgery;  Laterality: Left;   KNEE SURGERY Right    NASAL SEPTOPLASTY W/ TURBINOPLASTY Bilateral 03/19/2020   Procedure: NASAL SEPTOPLASTY WITH BILATERAL  TURBINATE REDUCTION;  Surgeon: Leta Baptist, MD;  Location: [facility];  Service: ENT;  Laterality: Bilateral;   VASECTOMY     Patient Active Problem List   Diagnosis Date Noted   Seizure ([place]) 08/13/2020   Seizures ([place]) 08/12/2020   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   DVT (deep venous thrombosis) ([place]) 08/12/2020   COVID-19 virus infection 08/12/2020   Acute lower UTI 07/14/2020   Delirium 07/06/2020   Sleep apnea 07/06/2020   GERD (gastroesophageal reflux disease) 07/06/2020   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[place]) 06/25/2020   Sundowning 06/25/2020   Slow transit constipation    Essential hypertension    Hypokalemia    Postoperative pain    Meningioma ([place]) 05/02/2020   Brain tumor ([place]) 04/28/2020   S/P nasal septoplasty 03/19/2020   Special screening for malignant neoplasms, colon 01/09/2018    REFERRING DIAG: D32.9 (ICD-10-CM) - Meningioma ([place]) G82.50 (ICD-10-CM) - 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[place]) R56.9 (ICD-10-CM) - Seizures ([place])    THERAPY DIAG:  Unsteadiness on feet  Muscle weakness (generalized)  Other abnormalities of gait and mobility   PERTINENT HISTORY: anxiety, OA, cancer, subdural hematoma     PRECAUTIONS: Fall  SUBJECTIVE: Pt reports he is not sore today, no new changes since yesterday. [name] reports she is unable to fit Stedy into [place] w/pt to bring to aquatic therapy   PAIN:  Are you having pain? 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 There are mild chronic small-vessel ischemic changes of the white matter. There is atrophy, encephalomalacia and gliosis along the medial aspects of both sides of the brain at the  frontoparietal vertex, sequela of the previous tumor and subsequent resection. It appears that the midportion of the superior sagittal sinus has been resected. No evidence of tumor in the venous sinuses anterior or posterior to that.  From MRI on 5/21: Redemonstrated sequela of prior vertex craniotomy/cranioplasty and parafalcine meningioma resection with partial resection of the superior sagittal sinus. No evidence of residual/recurrent tumor at the resection site.   Chronic intracranial findings without interval change, as described.   Paranasal sinus disease, as outlined.       TODAY'S TREATMENT:   NMR  -Sit <>stand pivot w/mod-max A x3 (two therapists and [name]) and bariatric RW w/pillowcase placed under RLE to allow therapist to move foot. Pt required mod A x2 to come to stand due to reduced anterior lean, BLE weakness, blocking of RLE and bracing of RW. Once standing, required max A x3 for AD management, RLE management, trunk support and max cues for sequencing of pivot. Pt unable to complete transfer prior to sitting without warning. Therapist able to push pt back onto edge of mat and required max A x3 therapists to scoot pt back onto mat to prevent pt sliding forward onto floor. Educated pt on importance of his communication w/therapy if he needs to sit, as it is extremely unsafe to sit mid-air. Came up w/safe-word of "[place]" to warn therapy team that pt needs to sit, pt verbalized understanding.   -Sit <>stands x4 w/4" step under RLE and mod A x2 therapists for improved BLE strength and facilitation of lateral weight shift to L side to prime movement for gait training. Pt required mod multimodal cues for upright posture w/each stand and frequently lost balance posteriorly, requiring mod A to correct. Pt also required assistance for AD management, as pt fearful of RW tipping over. Noted significant knee extension of LLE, the most extensive extension seen in therapy thus far.    Transferred to power chair from mat via Stedy due to time constraints.  PATIENT EDUCATION: Education details: Continue HEP Person educated: Patient and Spouse Education method: Explanation, Demonstration, Corporate treasurer cues, and Verbal cues Education comprehension: verbalized understanding and needs further education   HOME EXERCISE PROGRAM: [email or web address]  Access Code: [email or web address] URL: [email or web address] Date: 07/30/2021 Prepared by: Mickie Bail Shayda Kalka  Exercises - Modified Thomas Stretch  - 1 x daily - 7 x weekly - 3 sets - 4 reps - 45-60second hold - Supine Bridge  - 1 x daily - 7 x weekly - 3 sets - 10 reps   ASSESSMENT:   CLINICAL IMPRESSION: Emphasis of skilled PT session on lateral weight shifting and stand pivot transfers. Pt continues to require max A x3 for stand pivot transfers and narrowly missed a fall due to impulsively sitting mid-transfer today. Informed pt that he has to communicate with therapy for safety  w/transfers and using the safety word "timber" from now on. Pt able to perform sit <>stands w/4" step under RLE w/mod A today but continues to require external support to maintain midline orientation in sagittal plane. Pt's wife reports she is unable to fit Stedy into [place] w/patient, will notify aquatic therapist. Continue POC.     OBJECTIVE IMPAIRMENTS decreased activity tolerance, decreased balance, decreased coordination, decreased endurance, decreased knowledge of use of DME, decreased mobility, difficulty walking, decreased ROM, decreased strength, hypomobility, impaired perceived functional ability, impaired flexibility, impaired UE functional use, and postural dysfunction.    ACTIVITY LIMITATIONS cleaning, community activity, meal prep, laundry, and medication management.    PERSONAL FACTORS Time since onset of injury/illness/exacerbation and 3+ comorbidities: anxiety, OA, cancer,  subdural hematoma  are also affecting patient's functional outcome.      REHAB POTENTIAL: Fair due to amount of time since onset and previous progress with PT   CLINICAL DECISION MAKING: Evolving/moderate complexity   EVALUATION COMPLEXITY: Moderate     GOALS: Goals reviewed with patient? Yes  OLD LONG TERM GOALS:  Target date: 08/20/2021  Pt/caregiver will be IND with final HEP in order to indicate improved functional mobility and dec fall risk. Baseline:  Goal status: IN PROGRESS  2.  Pt will perform sit <>stand from 20" mat table to bariatric RW w/Min A for improved BLE strength and transfers  Baseline: Min A from 23" mat  Goal status: INITIAL  3.  Pt will ambulate 20' w/RW and body weight support as necessary w/mod A of single therapist for improved weightbearing tolerance and global strength  Baseline: 10' w/BW support and mod A x2  Goal status: INITIAL  NEW LONG TERM GOALS FOR EXTENDED POC:  Target date: 09/20/2021  Pt/caregiver will be IND with final HEP in order to indicate improved functional mobility and dec fall risk. Baseline:  Goal status: ONGOING  2.  Pt will perform sit <>stand from 20" mat table to bariatric RW at S level for improved BLE strength and transfers  Baseline: Min A from 23" mat  Goal status: REVISED  3.  Pt will ambulate 25' w/RW and body weight support as necessary w/mod A of single therapist for improved weightbearing tolerance and global strength  Baseline: 51' w/BW support and mod A x2  Goal status: ONGOING  4.  Pt will perform stand pivot transfer with bari RW (and LE bracing as needed) R and L at min A level in order to increase functional independence at home.   Baseline:   Goal Status: INITIAL   PLAN: PT FREQUENCY: 2x/week   PT DURATION: 5 weeks (recert)   PLANNED INTERVENTIONS: Therapeutic exercises, Therapeutic activity, Neuromuscular re-education, Balance training, Gait training, Patient/Family education, Joint mobilization,  Vestibular training, Orthotic/Fit training, DME instructions, and Manual therapy   PLAN FOR NEXT SESSION: Body-weight support gait training, Continue gait training in // bars. Stand pivot with bari RW. Provide handout of standing marches for HEP. Practice sit <>stand in // bars with wife. Lateral weight shifting in standing, rolling using PNF principles. Lateral scooting on mat, sit <>supine transfers, add strengthening to HEP as able. In therapy, use stedy for weight shifting, reaching,  trunk dissociation, sit <>stands in // bars.  He did so well seated in w/c facing mat, placing elbows on mat and lifting buttocks>coming to stand>weight shifting.  If we ever have +2A, I would love to try and get him prone.  He did not have a great experience with it in the past, but  UEs were not positioned well.     Charlett Nose, PT, [facility] [address] [place], [place]  [address] Phone:  [phone] Fax:  [phone] 08/26/21, 2:44 PM

## 2021-08-26 NOTE — Therapy (Signed)
OUTPATIENT OCCUPATIONAL THERAPY TREATMENT   Patient Name: Anthony Downs MRN: [id number] DOB:[birth year], 71 y.o., male Today's Date: 08/26/2021  PCP: Redmond School, MD REFERRING PROVIDER: Alger Simons MD  END OF SESSION:           ONSET DATE: 04/28/20  REFERRING DIAG: Meningioma, Tetraplegia  THERAPY DIAG:  Muscle weakness (generalized)  Unsteadiness on feet  Other abnormalities of gait and mobility  Other lack of coordination  Other disturbances of skin sensation  Stiffness of right shoulder, not elsewhere classified  Stiffness of right elbow, not elsewhere classified  Other symptoms and signs involving the musculoskeletal system  Abnormal posture  Tremor     Past Medical History:  Diagnosis Date   Acid reflux    Anxiety    Arthritis    Asthma    as a child   Basal cell carcinoma 01/26/2021   RIGHT POST SURICULAR INFERIOR   Basal cell carcinoma 01/26/2021   RIGHT POST AURICULAR SUPERIOR   Benign brain tumor ([place])    Cancer ([place])    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, squamous behind ear   Complication of anesthesia    slow to wake up and pain medicines make him sick   GERD (gastroesophageal reflux disease)    Hypercholesteremia    Hypertension    PONV (postoperative nausea and vomiting)    Sleep apnea    no Cpap   Subdural hematoma, 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   fell off truck hit head on concrete   Past Surgical History:  Procedure Laterality Date   APPLICATION OF CRANIAL NAVIGATION Left 04/29/2020   Procedure: [place];  Surgeon: Judith Part, MD;  Location: [facility];  Service: Neurosurgery;  Laterality: Left;   arthroscopic knee     COLONOSCOPY N/A 11/29/2018   Procedure: COLONOSCOPY;  Surgeon: Rogene Houston, MD;  [facility];  Service: Endoscopy;  Laterality: N/A;  730-rescheduled 9/2 same time per Ann   CRANIOTOMY Left 04/29/2020   Procedure: LEFT CRANIECTOMY WITH TUMOR EXCISION;   Surgeon: Judith Part, MD;  Location: [facility];  Service: Neurosurgery;  Laterality: Left;   KNEE SURGERY Right    NASAL SEPTOPLASTY W/ TURBINOPLASTY Bilateral 03/19/2020   Procedure: NASAL SEPTOPLASTY WITH BILATERAL  TURBINATE REDUCTION;  Surgeon: Leta Baptist, MD;  Location: 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   DVT (deep venous thrombosis) ([place]) 08/12/2020   COVID-19 virus infection 08/12/2020   Acute lower UTI 07/14/2020   Delirium 07/06/2020   Sleep apnea 07/06/2020   GERD (gastroesophageal reflux disease) 07/06/2020   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[place]) 06/25/2020   Sundowning 06/25/2020   Slow transit constipation    Essential hypertension    Hypokalemia    Postoperative pain    Meningioma ([place]) 05/02/2020   Brain tumor ([place]) 04/28/2020   S/P nasal septoplasty 03/19/2020   Special screening for malignant neoplasms, colon 01/09/2018    ONSET DATE: 04/28/20  REFERRING DIAG: Meningioma, Tetraplegia  PERTINENT HISTORY: traumatic SDH 2008  PRECAUTIONS: Fall Risk  SUBJECTIVE:   PAIN:  Are you having pain? No    TODAY'S TREATMENT:  Pt transferred scooting from W/C to mat with min A and increased time(level surface) Cane exercises for chest press and shoulder flexion min v.c  A/ROM scapular retraction, min v.c and facilitation  Sit-supine  with max A for LE's Closed chain shoulder flexion and chest press in supine, as well as AA/ROM R shoulder flexion Supine reaching with RUE in abduction to grasp 1 inch blocks to place in container, mod v.c    HOME EXERCISE PROGRAM: Encouraged to do table slides and pick up supine shoulder stretches/ROM exercises.  Issued 1/2 inch blocks for coordination  GOALS: Goals reviewed with patient? Yes   SHORT TERM GOALS:  Target Date: 08/20/21  1.  Patient and caregiver will complete  HEP designed to improve passive and active range of motion in BUE  Baseline:  Goal status: ACHIEVED   2.  Patient will utilize RUE to assist with at least one ADL task with mod cueing and minimal assistance  Baseline:  Goal status: ACHIEVED   3.  Patient will reach at least mid calf toward feet with BUE in preparation for participating in LB dressing  Baseline:  Goal status: ACHIEVED   4. Patient will demonstrate sufficient lateral lean left or right in seated position to prepare for placement of sliding board to encourage more active transfers and increase potential for commode transfers.                          Baseline:                          Goal status: ACHIEVED  5. Pt will be educated on available AE and DME to assist with ADL completion as needed for improved independence.   Baseline:  Goal status: ACHIEVED  6. Patient will demonstrate improved coordination in RUE as evidenced by at least 13 blocks (box in blocks) in one minute   Baseline: 9 blocks at renewal 05/28/21  Goal status: IN PROGRESS - RUE 10 blocks       LONG TERM GOALS: Target date: 10/22/2021   1.  Patient will complete updated HEP designed to improve range, strength, and functional use of BUE  Baseline:  Goal status: ONGOING - continue updating HEPs PRN   2.  Patient will complete a level surface transfer with slide board PRN with no more than mod assistance in preparation for commode transfer  Baseline:  Goal status: ACHIEVED level transfer with min A   3.  Patient will demonstrate improved coordination in RUE as evidenced by at least 8 blocks (box in blocks) in one minute  Baseline: 4 blocks Goal status: ACHIEVED 9 blocks   4.  Patient will demonstrate a 4lb increase in grip strength in RUE  Baseline: 6.1  Goal status: Achieved 5/31 - 12.9lb    5. Patient will complete at least 30% more of bathing and dressing tasks   Baseline:  Goal status: ACHIEVED  per pt wife-60-70%  6. Pt will assist with  clothing management and hygiene with unilateral support with min A with toileting.  Baseline: Goal Status: ACHIEVED - 7/13 assisting with bathing and LB dressing, assisting with clothing but not hygiene   7. Pt will increase range of motion in AROM in RUE shoulder flexion by at least 10 degrees for increasing ability to use right, dominant, hand, for more functional reaching tasks and ADLs.  Baseline: 70 degrees  Goal Status: ACHIEVED 7/13 RUE shoulder flexion 85*  8. Pt will stand with rolling walker for at least 3 minutes without physical assistance for caregiver to assist with clothing management and hygiene to progress towards increased independence with toileting.  Baseline: currently beginning to stand with rolling walker  [place] status: INITIAL  9. Patient will demonstrate improved coordination in RUE as evidenced by at least 15 blocks (box in blocks) in one minute   Baseline: 9 blocks at renewal 05/28/21  Goal status: IN PROGRESS - RUE 10 blocks  10. Pt will increase range of motion in AROM in RUE shoulder flexion to at least 90 degrees for increasing ability to use right, dominant, hand, for more functional reaching tasks and ADLs.  Baseline: 85 degrees AT RECERT  Goal Status: INITIAL       Plan     Clinical Impression Statement 08/26/21 Pt continues to progress towards goals with improved transfers  and RUE coordiantion.   OT Occupational Profile and History Detailed Assessment- Review of Records and additional review of physical, cognitive, psychosocial history related to current functional performance    Occupational performance deficits (Please refer to evaluation for details): ADL's;IADL's;Leisure    Body Structure / Function / Physical Skills ADL;Decreased knowledge of use of DME;Gait;Obesity;Strength;Tone;GMC;Dexterity;Balance;Body mechanics;Edema;Proprioception;UE functional use;Cardiopulmonary status limiting  activity;Endurance;IADL;ROM;Continence;Coordination;Flexibility;Mobility;Sensation;FMC;Decreased knowledge of precautions;Muscle spasms    Rehab Potential Good    Clinical Decision Making Several treatment options, min-mod task modification necessary    Comorbidities Affecting Occupational Performance: May have comorbidities impacting occupational performance    Modification or Assistance to Complete Evaluation  Min-Moderate modification of tasks or assist with assess necessary to complete eval    OT Frequency 2x / week    OT Duration 12 weeks    OT Treatment/Interventions Self-care/ADL training;Aquatic Therapy;DME and/or AE instruction;Splinting;Balance training;Therapeutic activities;Therapeutic exercise;Cognitive remediation/compensation;Passive range of motion;Functional Mobility Training;Neuromuscular education;Electrical Stimulation;Energy conservation;Patient/family education;Manual Therapy    Plan Continue standing tolerance with rolling walker, BUE ROM and strengthening, coordination for RUE.    Consulted and Agree with Plan of Care Patient;Family member/caregiver    Family Member Consulted wife             Theone Murdoch, OT 08/26/2021, 12:48 PM

## 2021-09-01 ENCOUNTER — Ambulatory Visit: Payer: Medicare Other | Admitting: Rehabilitation

## 2021-09-01 ENCOUNTER — Encounter: Payer: Self-pay | Admitting: Rehabilitation

## 2021-09-01 DIAGNOSIS — M6281 Muscle weakness (generalized): Secondary | ICD-10-CM | POA: Diagnosis not present

## 2021-09-01 DIAGNOSIS — R293 Abnormal posture: Secondary | ICD-10-CM

## 2021-09-01 DIAGNOSIS — R2689 Other abnormalities of gait and mobility: Secondary | ICD-10-CM

## 2021-09-01 DIAGNOSIS — R2681 Unsteadiness on feet: Secondary | ICD-10-CM

## 2021-09-01 NOTE — Therapy (Signed)
OUTPATIENT PHYSICAL THERAPY TREATMENT NOTE  Patient Name: Anthony Downs MRN: [id number] DOB:Nov 05, 1950, 71 y.o., male Today's Date: 09/01/2021  PCP: Redmond School, MD REFERRING PROVIDER: [name], MD     PT End of Session - 09/01/21 1331     Visit Number 39    Number of Visits 43   per updated POC   Date for PT Re-Evaluation 14/48/18   Recert   Authorization Type Medicare and [place] (needs 10th visit progress note)    Progress Note Due on Visit 81    PT Start Time 1230    PT Stop Time 1317    PT Time Calculation (min) 47 min    Equipment Utilized During Treatment Gait belt    Activity Tolerance Patient tolerated treatment well    Behavior During Therapy WFL for tasks assessed/performed                Past Medical History:  Diagnosis Date   Acid reflux    Anxiety    Arthritis    Asthma    as a child   Basal cell carcinoma 01/26/2021   RIGHT POST SURICULAR INFERIOR   Basal cell carcinoma 01/26/2021   RIGHT POST AURICULAR SUPERIOR   Benign brain tumor ([place])    Cancer ([place])    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, squamous behind ear   Complication of anesthesia    slow to wake up and pain medicines make him sick   GERD (gastroesophageal reflux disease)    Hypercholesteremia    Hypertension    PONV (postoperative nausea and vomiting)    Sleep apnea    no Cpap   Subdural hematoma, 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   fell off truck hit head on concrete   Past Surgical History:  Procedure Laterality Date   APPLICATION OF CRANIAL NAVIGATION Left 04/29/2020   Procedure: [place];  Surgeon: Judith Part, MD;  Location: [facility];  Service: Neurosurgery;  Laterality: Left;   arthroscopic knee     COLONOSCOPY N/A 11/29/2018   Procedure: COLONOSCOPY;  Surgeon: Rogene Houston, MD;  [facility];  Service: Endoscopy;  Laterality: N/A;  730-rescheduled 9/2 same time per Ann   CRANIOTOMY Left 04/29/2020   Procedure: LEFT  CRANIECTOMY WITH TUMOR EXCISION;  Surgeon: Judith Part, MD;  Location: [facility];  Service: Neurosurgery;  Laterality: Left;   KNEE SURGERY Right    NASAL SEPTOPLASTY W/ TURBINOPLASTY Bilateral 03/19/2020   Procedure: NASAL SEPTOPLASTY WITH BILATERAL  TURBINATE REDUCTION;  Surgeon: Leta Baptist, MD;  Location: [facility];  Service: ENT;  Laterality: Bilateral;   VASECTOMY     Patient Active Problem List   Diagnosis Date Noted   Seizure ([place]) 08/13/2020   Seizures ([place]) 08/12/2020   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50 (ICD-10-CM) - 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[place]) R56.9 (ICD-10-CM) - Seizures ([place])    THERAPY DIAG:  Unsteadiness on feet  Muscle weakness (generalized)  Other abnormalities of gait and mobility  Abnormal posture   PERTINENT HISTORY: anxiety, OA, cancer, subdural hematoma     PRECAUTIONS: Fall  SUBJECTIVE: Pt reports he is not sore today, no new changes since yesterday. [name] reports she is unable to fit Stedy into [place] w/pt to bring to aquatic therapy   PAIN:  Are you having pain? 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 There are mild chronic small-vessel ischemic changes of the white matter. There is atrophy, encephalomalacia and gliosis along the medial aspects of both sides of  the brain at the frontoparietal vertex, sequela of the previous tumor and subsequent resection. It appears that the midportion of the superior sagittal sinus has been resected. No evidence of tumor in the venous sinuses anterior or posterior to that.  From MRI on 5/21: Redemonstrated sequela of prior vertex craniotomy/cranioplasty and parafalcine meningioma resection with partial resection of the superior sagittal sinus. No evidence of residual/recurrent tumor at the resection site.   Chronic intracranial findings without interval change, as described.   Paranasal sinus disease, as outlined.       TODAY'S TREATMENT:   TA (Note that PT donned ACE wrap in DF assist manner on RLE to assist with sliding RLE but also to provide input to move RLE during pivot transfers).   Inside // bars:  Performed 3 sit<>stands throughout with use of bars with PT providing tactile cues for shifting hand placement forward to encourage more forward trunk flexion and intermittent min/guard to stand from power w/c.  While in standing, worked on 1/4 turns both R and L x 1 rep in // bars with pt able to self turn RLE when R, and PT able to easily assist RLE to turn when turning L.  Moved outside of bars (facing bars) and placed chair on either side and continued to practice stand pivot R and L x 1 rep each.  Again, pt able to initiate movement in RLE and PT able to provide easier assist to move RLE today vs previous sessions.  He does need min A to stand from arm chair due to it being lower and cues to place one hand on stable arm rest and one on RW.  Progressed even more by bringing bari RW in front of pt (had him back up but still close enough that RW was butting up against // bars to ensure it wouldn't move) and had him transfer only to the L to arm chair then back to power w/c (due to time constaint) but he did need more cuing for posture, hand placement when standing (esp from arm chair).  Once in standing, provided cues  and assist for moving RW slightly and for sequencing.  Pt did have more difficulty moving RLE on his own during this transfer but with cuing from PT, PT was able to move RLE for safe placement.    Continue to educate that he NEEDS to be standing to RW at home.  Suggested he place RW in between him and wall for support, but that we will need to continue to see some progress before PT will add more visits.  Pt and wife verbalized understanding.    PATIENT EDUCATION: Education details: Continue HEP and POC Person educated: Patient and Spouse Education method: Explanation, Demonstration, Tactile cues, and Verbal cues Education comprehension: verbalized understanding and needs further  education   HOME EXERCISE PROGRAM: [email or web address]  Access Code: [email or web address] URL: [email or web address] Date: 07/30/2021 Prepared by: Mickie Bail Plaster  Exercises - Modified Thomas Stretch  - 1 x daily - 7 x weekly - 3 sets - 4 reps - 45-60second hold - Supine Bridge  - 1 x daily - 7 x weekly - 3 sets - 10 reps   ASSESSMENT:   CLINICAL IMPRESSION: Skilled session continues to focus on stand pivot transfers.  Began in // bars to improve confidence and weight shifting during task, progressing outside of bars (still using bars) then with bari RW.  Pt making progress throughout and noted to have improved weight shift today with improved ability to initiate movement in RLE and have therapist assist.      OBJECTIVE IMPAIRMENTS decreased activity tolerance, decreased balance, decreased coordination, decreased endurance, decreased knowledge of use of DME, decreased mobility, difficulty walking, decreased ROM, decreased strength, hypomobility, impaired perceived functional ability, impaired flexibility, impaired UE functional use, and postural dysfunction.    ACTIVITY LIMITATIONS cleaning, community activity, meal prep, laundry, and  medication management.    PERSONAL FACTORS Time since onset of injury/illness/exacerbation and 3+ comorbidities: anxiety, OA, cancer, subdural hematoma  are also affecting patient's functional outcome.      REHAB POTENTIAL: Fair due to amount of time since onset and previous progress with PT   CLINICAL DECISION MAKING: Evolving/moderate complexity   EVALUATION COMPLEXITY: Moderate     GOALS: Goals reviewed with patient? Yes  OLD LONG TERM GOALS:  Target date: 08/20/2021  Pt/caregiver will be IND with final HEP in order to indicate improved functional mobility and dec fall risk. Baseline:  Goal status: IN PROGRESS  2.  Pt will perform sit <>stand from 20" mat table to bariatric RW w/Min A for improved BLE strength and transfers  Baseline: Min A from 23" mat  Goal status: INITIAL  3.  Pt will ambulate 20' w/RW and body weight support as necessary w/mod A of single therapist for improved weightbearing tolerance and global strength  Baseline: 10' w/BW support and mod A x2  Goal status: INITIAL  NEW LONG TERM GOALS FOR EXTENDED POC:  Target date: 09/20/2021  Pt/caregiver will be IND with final HEP in order to indicate improved functional mobility and dec fall risk. Baseline:  Goal status: ONGOING  2.  Pt will perform sit <>stand from 20" mat table to bariatric RW at S level for improved BLE strength and transfers  Baseline: Min A from 23" mat  Goal status: REVISED  3.  Pt will ambulate 25' w/RW and body weight support as necessary w/mod A of single therapist for improved weightbearing tolerance and global strength  Baseline: 89' w/BW support and mod A x2  Goal status: ONGOING  4.  Pt will perform stand pivot transfer with bari RW (and LE bracing as needed) R and L at min A level in order to increase functional independence at home.   Baseline:   Goal Status: INITIAL   PLAN: PT FREQUENCY: 2x/week   PT DURATION: 5 weeks (recert)   PLANNED INTERVENTIONS: Therapeutic exercises,  Therapeutic activity, Neuromuscular re-education, Balance training, Gait training, Patient/Family education, Joint mobilization, Vestibular training, Orthotic/Fit training, DME instructions, and Manual therapy   PLAN FOR NEXT SESSION: Body-weight support gait training, Continue gait training in // bars. Stand pivot with bari RW Raquel Sarna had good success using ACE wrap with DF assist to provide feedback and ease of moving foot on RLE). Provide handout of standing marches for HEP. Practice sit <>  stand in // bars with wife. Lateral weight shifting in standing, rolling using PNF principles. Lateral scooting on mat, sit <>supine transfers, add strengthening to HEP as able. In therapy, use stedy for weight shifting, reaching,  trunk dissociation, sit <>stands in // bars.  He did so well seated in w/c facing mat, placing elbows on mat and lifting buttocks>coming to stand>weight shifting.  If we ever have +2A, I would love to try and get him prone.  He did not have a great experience with it in the past, but UEs were not positioned well.     Cameron Sprang, PT, MPT [facility] [address] [place], [place], [address] Phone: [phone]   Fax:  [phone] 09/01/21, 1:32 PM

## 2021-09-03 ENCOUNTER — Encounter: Payer: Self-pay | Admitting: Occupational Therapy

## 2021-09-03 ENCOUNTER — Ambulatory Visit: Payer: Medicare Other | Admitting: Occupational Therapy

## 2021-09-03 DIAGNOSIS — M25621 Stiffness of right elbow, not elsewhere classified: Secondary | ICD-10-CM

## 2021-09-03 DIAGNOSIS — R208 Other disturbances of skin sensation: Secondary | ICD-10-CM

## 2021-09-03 DIAGNOSIS — R2681 Unsteadiness on feet: Secondary | ICD-10-CM

## 2021-09-03 DIAGNOSIS — R278 Other lack of coordination: Secondary | ICD-10-CM

## 2021-09-03 DIAGNOSIS — M25611 Stiffness of right shoulder, not elsewhere classified: Secondary | ICD-10-CM

## 2021-09-03 DIAGNOSIS — R29898 Other symptoms and signs involving the musculoskeletal system: Secondary | ICD-10-CM

## 2021-09-03 DIAGNOSIS — M6281 Muscle weakness (generalized): Secondary | ICD-10-CM

## 2021-09-03 DIAGNOSIS — R293 Abnormal posture: Secondary | ICD-10-CM

## 2021-09-03 NOTE — Therapy (Signed)
OUTPATIENT OCCUPATIONAL THERAPY TREATMENT   Patient Name: Anthony Downs MRN: [id number] DOB:[birth year], 71 y.o., male Today's Date: 09/03/2021  PCP: Redmond School, MD REFERRING PROVIDER: Alger Simons MD  END OF SESSION:   OT End of Session - 09/03/21 1240     Visit Number 38    Number of Visits 54    Date for OT Re-Evaluation 10/22/21    Authorization Type Medicare and UHC- need KX    Authorization Time Period UHC VL - Need medical auth after 30 visits    Progress Note Due on Visit 18    OT Start Time 1145    OT Stop Time 1230    OT Time Calculation (min) 45 min    Activity Tolerance Patient tolerated treatment well    Behavior During Therapy WFL for tasks assessed/performed                    ONSET DATE: 04/28/20  REFERRING DIAG: Meningioma, Tetraplegia  THERAPY DIAG:  Unsteadiness on feet  Muscle weakness (generalized)  Abnormal posture  Other lack of coordination  Other disturbances of skin sensation  Stiffness of right shoulder, not elsewhere classified  Stiffness of right elbow, not elsewhere classified  Other symptoms and signs involving the musculoskeletal system     Past Medical History:  Diagnosis Date   Acid reflux    Anxiety    Arthritis    Asthma    as a child   Basal cell carcinoma 01/26/2021   RIGHT POST SURICULAR INFERIOR   Basal cell carcinoma 01/26/2021   RIGHT POST AURICULAR SUPERIOR   Benign brain tumor ([place])    Cancer ([place])    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, squamous behind ear   Complication of anesthesia    slow to wake up and pain medicines make him sick   GERD (gastroesophageal reflux disease)    Hypercholesteremia    Hypertension    PONV (postoperative nausea and vomiting)    Sleep apnea    no Cpap   Subdural hematoma, 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   fell off truck hit head on concrete   Past Surgical History:  Procedure Laterality Date   APPLICATION OF CRANIAL NAVIGATION Left 04/29/2020    Procedure: [place];  Surgeon: Judith Part, MD;  Location: [facility];  Service: Neurosurgery;  Laterality: Left;   arthroscopic knee     COLONOSCOPY N/A 11/29/2018   Procedure: COLONOSCOPY;  Surgeon: Rogene Houston, MD;  [facility];  Service: Endoscopy;  Laterality: N/A;  730-rescheduled 9/2 same time per Ann   CRANIOTOMY Left 04/29/2020   Procedure: LEFT CRANIECTOMY WITH TUMOR EXCISION;  Surgeon: Judith Part, MD;  Location: [facility];  Service: Neurosurgery;  Laterality: Left;   KNEE SURGERY Right    NASAL SEPTOPLASTY W/ TURBINOPLASTY Bilateral 03/19/2020   Procedure: NASAL SEPTOPLASTY WITH BILATERAL  TURBINATE REDUCTION;  Surgeon: Leta Baptist, MD;  Location: [facility];  Service: ENT;  Laterality: Bilateral;   VASECTOMY     Patient Active Problem List   Diagnosis Date Noted   Seizure ([place]) 08/13/2020   Seizures ([place]) 08/12/2020   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   DVT (deep venous thrombosis) ([place]) 08/12/2020   COVID-19 virus infection 08/12/2020   Acute lower UTI 07/14/2020   Delirium 07/06/2020   Sleep apnea 07/06/2020   GERD (gastroesophageal reflux disease) 07/06/2020   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[place]) 06/25/2020   Sundowning 06/25/2020   Slow transit constipation    Essential hypertension  Hypokalemia    Postoperative pain    Meningioma ([place]) 05/02/2020   Brain tumor ([place]) 04/28/2020   S/P nasal septoplasty 03/19/2020   Special screening for malignant neoplasms, colon 01/09/2018    ONSET DATE: 04/28/20  REFERRING DIAG: Meningioma, Tetraplegia  PERTINENT HISTORY: traumatic SDH 2008  PRECAUTIONS: Fall Risk  SUBJECTIVE: Denies pain  PAIN:  Are you having pain? No    TODAY'S TREATMENT:  09/03/21 Discussion with patient and wife regarding aquatics program.  Patient could benefit from either OT or PT in the pool.   Patient transferred wheelchair to mat table with set up assistance to break down chair.  Neuromuscular reeducation to  address postural control - sustained trunk extension and weight shift toward right.  Worked on realignment of right shoulder then guided supported movement toward increased shoulder flexion and abduction.  Facilitation and cueing to address mechanics of reach pattern while providing extended stretch to shoulder and elbow at end range.  Attempted to increase forearm supination - patient reports discomfort at wrist.   Patient able to transfer toward right with set up assistance to get back into wheelchair.  Cueing for sufficient forward flexion of trunk over base of support.     HOME EXERCISE PROGRAM: Encouraged to do table slides and pick up supine shoulder stretches/ROM exercises.  Issued 1/2 inch blocks for coordination  GOALS: Goals reviewed with patient? Yes   SHORT TERM GOALS:  Target Date: 08/20/21  1.  Patient and caregiver will complete HEP designed to improve passive and active range of motion in BUE  Baseline:  Goal status: ACHIEVED   2.  Patient will utilize RUE to assist with at least one ADL task with mod cueing and minimal assistance  Baseline:  Goal status: ACHIEVED   3.  Patient will reach at least mid calf toward feet with BUE in preparation for participating in LB dressing  Baseline:  Goal status: ACHIEVED   4. Patient will demonstrate sufficient lateral lean left or right in seated position to prepare for placement of sliding board to encourage more active transfers and increase potential for commode transfers.                          Baseline:                          Goal status: ACHIEVED  5. Pt will be educated on available AE and DME to assist with ADL completion as needed for improved independence.   Baseline:  Goal status: ACHIEVED  6. Patient will demonstrate improved coordination in RUE as evidenced by at least 13 blocks (box in blocks) in one minute   Baseline: 9 blocks at renewal 05/28/21  Goal status: IN PROGRESS - RUE 10 blocks       LONG TERM GOALS:  Target date: 10/22/2021   1.  Patient will complete updated HEP designed to improve range, strength, and functional use of BUE  Baseline:  Goal status: ONGOING - continue updating HEPs PRN   2.  Patient will complete a level surface transfer with slide board PRN with no more than mod assistance in preparation for commode transfer  Baseline:  Goal status: ACHIEVED level transfer with min A   3.  Patient will demonstrate improved coordination in RUE as evidenced by at least 8 blocks (box in blocks) in one minute  Baseline: 4 blocks Goal status: ACHIEVED 9 blocks   4.  Patient will demonstrate a 4lb increase in grip strength in RUE  Baseline: 6.1  Goal status: Achieved 5/31 - 12.9lb    5. Patient will complete at least 30% more of bathing and dressing tasks   Baseline:  Goal status: ACHIEVED  per pt wife-60-70%  6. Pt will assist with clothing management and hygiene with unilateral support with min A with toileting.  Baseline: Goal Status: ACHIEVED - 7/13 assisting with bathing and LB dressing, assisting with clothing but not hygiene   7. Pt will increase range of motion in AROM in RUE shoulder flexion by at least 10 degrees for increasing ability to use right, dominant, hand, for more functional reaching tasks and ADLs.  Baseline: 70 degrees  Goal Status: ACHIEVED 7/13 RUE shoulder flexion 85*  8. Pt will stand with rolling walker for at least 3 minutes without physical assistance for caregiver to assist with clothing management and hygiene to progress towards increased independence with toileting.   Baseline: currently beginning to stand with rolling walker  [place] status: INITIAL  9. Patient will demonstrate improved coordination in RUE as evidenced by at least 15 blocks (box in blocks) in one minute   Baseline: 9 blocks at renewal 05/28/21  Goal status: IN PROGRESS - RUE 10 blocks  10. Pt will increase range of motion in AROM in RUE shoulder flexion to at least 90 degrees for  increasing ability to use right, dominant, hand, for more functional reaching tasks and ADLs.  Baseline: 85 degrees AT RECERT  Goal Status: INITIAL       Plan     Clinical Impression Statement 09/03/21 Pt continues to benefit from OT intervention and he displays improved functional mobility, stand tolerance, and RUE range of motion.    OT Occupational Profile and History Detailed Assessment- Review of Records and additional review of physical, cognitive, psychosocial history related to current functional performance    Occupational performance deficits (Please refer to evaluation for details): ADL's;IADL's;Leisure    Body Structure / Function / Physical Skills ADL;Decreased knowledge of use of DME;Gait;Obesity;Strength;Tone;GMC;Dexterity;Balance;Body mechanics;Edema;Proprioception;UE functional use;Cardiopulmonary status limiting activity;Endurance;IADL;ROM;Continence;Coordination;Flexibility;Mobility;Sensation;FMC;Decreased knowledge of precautions;Muscle spasms    Rehab Potential Good    Clinical Decision Making Several treatment options, min-mod task modification necessary    Comorbidities Affecting Occupational Performance: May have comorbidities impacting occupational performance    Modification or Assistance to Complete Evaluation  Min-Moderate modification of tasks or assist with assess necessary to complete eval    OT Frequency 2x / week    OT Duration 12 weeks    OT Treatment/Interventions Self-care/ADL training;Aquatic Therapy;DME and/or AE instruction;Splinting;Balance training;Therapeutic activities;Therapeutic exercise;Cognitive remediation/compensation;Passive range of motion;Functional Mobility Training;Neuromuscular education;Electrical Stimulation;Energy conservation;Patient/family education;Manual Therapy    Plan Continue standing tolerance BUE ROM and strengthening, coordination for RUE.    Consulted and Agree with Plan of Care Patient;Family member/caregiver    Family  Member Consulted wife            Mariah Milling, [place] 09/03/2021, 12:42 PM

## 2021-09-04 ENCOUNTER — Ambulatory Visit: Payer: Medicare Other | Admitting: Physical Therapy

## 2021-09-04 DIAGNOSIS — M6281 Muscle weakness (generalized): Secondary | ICD-10-CM | POA: Diagnosis not present

## 2021-09-04 DIAGNOSIS — R2681 Unsteadiness on feet: Secondary | ICD-10-CM

## 2021-09-04 DIAGNOSIS — R278 Other lack of coordination: Secondary | ICD-10-CM

## 2021-09-04 NOTE — Therapy (Signed)
OUTPATIENT PHYSICAL THERAPY TREATMENT NOTE- 40TH VISIT PROGRESS NOTE  Patient Name: Anthony Downs MRN: [id number] DOB:January 07, 1951, 71 y.o., male Today's Date: 09/04/2021  PCP: Redmond School, MD REFERRING PROVIDER: [name], MD   Physical Therapy Progress Note   Dates of Reporting Period:04/01/21 - 09/04/21  See Note below for Objective Data and Assessment of Progress/Goals.  Thank you for the referral of this patient. Mickie Bail Jerimy Johanson, PT, DPT     PT End of Session - 09/04/21 1021     Visit Number 40    Number of Visits 43   per updated POC   Date for PT Re-Evaluation 02/63/78   Recert   Authorization Type Medicare and [place] (needs 10th visit progress note)    Progress Note Due on Visit 64    PT Start Time 1018    PT Stop Time 1103    PT Time Calculation (min) 45 min    Equipment Utilized During Treatment Gait belt    Activity Tolerance Patient tolerated treatment well    Behavior During Therapy WFL for tasks assessed/performed                Past Medical History:  Diagnosis Date   Acid reflux    Anxiety    Arthritis    Asthma    as a child   Basal cell carcinoma 01/26/2021   RIGHT POST SURICULAR INFERIOR   Basal cell carcinoma 01/26/2021   RIGHT POST AURICULAR SUPERIOR   Benign brain tumor ([place])    Cancer ([place])    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, squamous behind ear   Complication of anesthesia    slow to wake up and pain medicines make him sick   GERD (gastroesophageal reflux disease)    Hypercholesteremia    Hypertension    PONV (postoperative nausea and vomiting)    Sleep apnea    no Cpap   Subdural hematoma, post-traumatic ([place]) 2008   fell off truck hit head on concrete   Past Surgical History:  Procedure Laterality Date   APPLICATION OF CRANIAL NAVIGATION Left 04/29/2020   Procedure: [place];  Surgeon: Judith Part, MD;  Location: [facility];  Service: Neurosurgery;  Laterality: Left;   arthroscopic  knee     COLONOSCOPY N/A 11/29/2018   Procedure: COLONOSCOPY;  Surgeon: Rogene Houston, MD;  [facility];  Service: Endoscopy;  Laterality: N/A;  730-rescheduled 9/2 same time per Ann   CRANIOTOMY Left 04/29/2020   Procedure: LEFT CRANIECTOMY WITH TUMOR EXCISION;  Surgeon: Judith Part, MD;  Location: [facility];  Service: Neurosurgery;  Laterality: Left;   KNEE SURGERY Right    NASAL SEPTOPLASTY W/ TURBINOPLASTY Bilateral 03/19/2020   Procedure: NASAL SEPTOPLASTY WITH BILATERAL  TURBINATE REDUCTION;  Surgeon: Leta Baptist, MD;  Location: [facility];  Service: ENT;  Laterality: Bilateral;   VASECTOMY     Patient Active Problem List   Diagnosis Date Noted   Seizure ([place]) 08/13/2020   Seizures ([place]) 08/12/2020   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   DVT (deep venous thrombosis) ([place]) 08/12/2020   COVID-19 virus infection 08/12/2020   Acute lower UTI 07/14/2020   Delirium 07/06/2020   Sleep apnea 07/06/2020   GERD (gastroesophageal reflux disease) 07/06/2020   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[place]) 06/25/2020   Sundowning 06/25/2020   Slow transit constipation    Essential hypertension    Hypokalemia    Postoperative pain    Meningioma ([place]) 05/02/2020   Brain tumor ([place]) 04/28/2020  S/P nasal septoplasty 03/19/2020   Special screening for malignant neoplasms, colon 01/09/2018    REFERRING DIAG: D32.9 (ICD-10-CM) - Meningioma ([facility]) G82.50 (ICD-10-CM) - 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[place]) R56.9 (ICD-10-CM) - Seizures ([place])    THERAPY DIAG:  Unsteadiness on feet  Muscle weakness (generalized)  Other lack of coordination   PERTINENT HISTORY: anxiety, OA, cancer, subdural hematoma     PRECAUTIONS: Fall  SUBJECTIVE: Pt reports he has not been practicing using the RW at home due to embarrassment and fear. No other changes. Pt wearing [employer] Dude shoes today, [name] states she hopes it well assist w/moving RLE  PAIN:  Are you having pain? 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 There are mild chronic small-vessel ischemic changes of the white matter. There is atrophy, encephalomalacia and gliosis along the medial aspects of both sides of the brain at the frontoparietal vertex, sequela of the previous tumor and subsequent resection. It appears that the midportion of the superior sagittal sinus has been resected. No evidence of tumor in the venous sinuses anterior or posterior to that.  From MRI on 5/21: Redemonstrated sequela of prior vertex craniotomy/cranioplasty and parafalcine meningioma resection with partial resection of the superior sagittal sinus. No evidence of residual/recurrent tumor at the resection site.   Chronic intracranial findings without interval change, as described.   Paranasal sinus disease, as outlined.       TODAY'S TREATMENT:   Self-care/home management Lengthy discussion regarding importance of practicing use of RW at home to avoid plateau in therapy and improve endurance/weightbearing tolerance for gait training in clinic. Pt states he gets embarrassed to practice use of RW when company is there but does not feel safe to practice with only [name]. Informed pt that therapy trusts [name] to perform sit <>stand at home w/RW and she has played a critical role in his rehab. Educated pt on the facets of neuroplasticity and importance of repetition and high intensity. Pt verbalized understanding.   NMR  In // bars for improved transfers, lateral weightshifitng, RLE coordination and endurance: -Pt performed 3 sit <>stands using RW in bars (not using bars) w/min A from [place] only w/therapist S* in order to make pt feel more comfortable performing at home. [name] able to cue pt on foot position, anterior weight shift and postural control.   -Attempted sliding RLE fwd and retro without ace wrap in standing, but pt unable to facilitate lateral weight shift to unweight RLE and slide it forward. Wrapped pt's R foot w/ace wrap to provide tactile cue and pt able to bring R foot forward and retro on final stand x2 each direction w/max A.  -All stand <>sits w/poor eccentric control throughout session despite cues.    PATIENT EDUCATION: Education details: Continue HEP and POC, see above  Person educated: Patient and Spouse Education method: Explanation, Demonstration, Tactile cues, and Verbal cues Education comprehension: verbalized understanding and needs further education   HOME EXERCISE PROGRAM: [email or web address]  Access Code: [email or web address] URL: [email or web address] Date: 07/30/2021 Prepared by: Mickie Bail Rosangelica Pevehouse  Exercises - Modified Thomas Stretch  - 1 x daily - 7 x weekly - 3 sets - 4 reps - 45-60second hold - Supine Bridge  - 1 x daily - 7 x weekly - 3 sets - 10 reps   ASSESSMENT:  CLINICAL IMPRESSION: Emphasis of skilled PT session on pt education and coordination of RLE. Informed pt that he must be using RW at home in order to see functional gains and carryover from therapy.  Pt continues to be limited by poor motor planning and spatial awareness w/lateral weight shifting and gait kinematics. Pt reported he would work on standing w/RW this weekend with [name]. Continue POC.     OBJECTIVE IMPAIRMENTS decreased activity tolerance, decreased balance, decreased coordination, decreased endurance, decreased knowledge of use of DME, decreased mobility, difficulty walking, decreased ROM, decreased strength, hypomobility, impaired perceived functional ability, impaired flexibility, impaired UE functional use, and postural dysfunction.    ACTIVITY LIMITATIONS cleaning, community activity, meal prep, laundry, and medication management.    PERSONAL FACTORS  Time since onset of injury/illness/exacerbation and 3+ comorbidities: anxiety, OA, cancer, subdural hematoma  are also affecting patient's functional outcome.      REHAB POTENTIAL: Fair due to amount of time since onset and previous progress with PT   CLINICAL DECISION MAKING: Evolving/moderate complexity   EVALUATION COMPLEXITY: Moderate     GOALS: Goals reviewed with patient? Yes  OLD LONG TERM GOALS:  Target date: 08/20/2021  Pt/caregiver will be IND with final HEP in order to indicate improved functional mobility and dec fall risk. Baseline:  Goal status: IN PROGRESS  2.  Pt will perform sit <>stand from 20" mat table to bariatric RW w/Min A for improved BLE strength and transfers  Baseline: Min A from 23" mat  Goal status: INITIAL  3.  Pt will ambulate 20' w/RW and body weight support as necessary w/mod A of single therapist for improved weightbearing tolerance and global strength  Baseline: 10' w/BW support and mod A x2  Goal status: INITIAL  NEW LONG TERM GOALS FOR EXTENDED POC:  Target date: 09/20/2021  Pt/caregiver will be IND with final HEP in order to indicate improved functional mobility and dec fall risk. Baseline:  Goal status: ONGOING  2.  Pt will perform sit <>stand from 20" mat table to bariatric RW at S level for improved BLE strength and transfers  Baseline: Min A from 23" mat  Goal status: REVISED  3.  Pt will ambulate 25' w/RW and body weight support as necessary w/mod A of single therapist for improved weightbearing tolerance and global strength  Baseline: 65' w/BW support and mod A x2  Goal status: ONGOING  4.  Pt will perform stand pivot transfer with bari RW (and LE bracing as needed) R and L at min A level in order to increase functional independence at home.   Baseline:   Goal Status: INITIAL   PLAN: PT FREQUENCY: 2x/week   PT DURATION: 5 weeks (recert)   PLANNED INTERVENTIONS: Therapeutic exercises, Therapeutic activity, Neuromuscular  re-education, Balance training, Gait training, Patient/Family education, Joint mobilization, Vestibular training, Orthotic/Fit training, DME instructions, and Manual therapy   PLAN FOR NEXT SESSION: Did they stand w/his RW? Body-weight support gait training, Continue gait training in // bars. Stand pivot with bari RW Raquel Sarna had good success using ACE wrap with DF assist to provide feedback and ease of moving foot on RLE). Provide handout of standing marches for HEP. Practice sit <>stand in // bars with wife. Lateral weight shifting in standing, rolling using PNF principles. Lateral scooting on mat, sit <>supine transfers, add strengthening to HEP as able. In therapy, use stedy for weight shifting, reaching,  trunk dissociation, sit <>stands in // bars.  He did so well seated in w/c facing mat, placing  elbows on mat and lifting buttocks>coming to stand>weight shifting.  If we ever have +2A, I would love to try and get him prone.  He did not have a great experience with it in the past, but UEs were not positioned well.     Charlett Nose, PT, [facility] [address] [place], [place]  [address] Phone:  [phone] Fax:  [phone] 09/04/21, 12:27 PM

## 2021-09-07 ENCOUNTER — Ambulatory Visit: Payer: Medicare Other | Admitting: Physical Therapy

## 2021-09-07 DIAGNOSIS — M6281 Muscle weakness (generalized): Secondary | ICD-10-CM

## 2021-09-07 DIAGNOSIS — R293 Abnormal posture: Secondary | ICD-10-CM

## 2021-09-07 DIAGNOSIS — R2681 Unsteadiness on feet: Secondary | ICD-10-CM

## 2021-09-07 NOTE — Therapy (Signed)
OUTPATIENT PHYSICAL THERAPY TREATMENT NOTE  Patient Name: Anthony Downs MRN: [id number] DOB:07-19-1950, 71 y.o., male Today's Date: 09/07/2021  PCP: Redmond School, MD REFERRING PROVIDER: [name], MD      PT End of Session - 09/07/21 1240     Visit Number 41    Number of Visits 43   per updated POC   Date for PT Re-Evaluation 23/53/61   Recert   Authorization Type Medicare and [place] (needs 10th visit progress note)    Progress Note Due on Visit 60    PT Start Time 1231    PT Stop Time 1318    PT Time Calculation (min) 47 min    Equipment Utilized During Treatment Gait belt    Activity Tolerance Patient tolerated treatment well    Behavior During Therapy WFL for tasks assessed/performed                 Past Medical History:  Diagnosis Date   Acid reflux    Anxiety    Arthritis    Asthma    as a child   Basal cell carcinoma 01/26/2021   RIGHT POST SURICULAR INFERIOR   Basal cell carcinoma 01/26/2021   RIGHT POST AURICULAR SUPERIOR   Benign brain tumor ([place])    Cancer ([place])    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, squamous behind ear   Complication of anesthesia    slow to wake up and pain medicines make him sick   GERD (gastroesophageal reflux disease)    Hypercholesteremia    Hypertension    PONV (postoperative nausea and vomiting)    Sleep apnea    no Cpap   Subdural hematoma, 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   fell off truck hit head on concrete   Past Surgical History:  Procedure Laterality Date   APPLICATION OF CRANIAL NAVIGATION Left 04/29/2020   Procedure: [place];  Surgeon: Judith Part, MD;  Location: [facility];  Service: Neurosurgery;  Laterality: Left;   arthroscopic knee     COLONOSCOPY N/A 11/29/2018   Procedure: COLONOSCOPY;  Surgeon: Rogene Houston, MD;  [facility];  Service: Endoscopy;  Laterality: N/A;  730-rescheduled 9/2 same time per Ann   CRANIOTOMY Left 04/29/2020   Procedure: LEFT  CRANIECTOMY WITH TUMOR EXCISION;  Surgeon: Judith Part, MD;  Location: [facility];  Service: Neurosurgery;  Laterality: Left;   KNEE SURGERY Right    NASAL SEPTOPLASTY W/ TURBINOPLASTY Bilateral 03/19/2020   Procedure: NASAL SEPTOPLASTY WITH BILATERAL  TURBINATE REDUCTION;  Surgeon: Leta Baptist, MD;  Location: [facility];  Service: ENT;  Laterality: Bilateral;   VASECTOMY     Patient Active Problem List   Diagnosis Date Noted   Seizure ([place]) 08/13/2020   Seizures ([place]) 08/12/2020   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   DVT (deep venous thrombosis) ([place]) 08/12/2020   COVID-19 virus infection 08/12/2020   Acute lower UTI 07/14/2020   Delirium 07/06/2020   Sleep apnea 07/06/2020   GERD (gastroesophageal reflux disease) 07/06/2020   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[place]) 06/25/2020   Sundowning 06/25/2020   Slow transit constipation    Essential hypertension    Hypokalemia    Postoperative pain    Meningioma ([place]) 05/02/2020   Brain tumor ([place]) 04/28/2020   S/P nasal septoplasty 03/19/2020   Special screening for malignant neoplasms, colon 01/09/2018    REFERRING DIAG: D32.9 (ICD-10-CM) - Meningioma ([place]) G82.50 (ICD-10-CM) - 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[place]) R56.9 (ICD-10-CM) - Seizures ([place])    THERAPY DIAG:  Unsteadiness  on feet  Muscle weakness (generalized)  Abnormal posture   PERTINENT HISTORY: anxiety, OA, cancer, subdural hematoma     PRECAUTIONS: Fall  SUBJECTIVE: Pt reports he stood to the walker every day this weekend, feels as though his buttocks is "burning" when he stands >1 minute. No new changes   PAIN:  Are you having pain? 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 There are mild chronic small-vessel ischemic changes of the white matter. There is atrophy,  encephalomalacia and gliosis along the medial aspects of both sides of the brain at the frontoparietal vertex, sequela of the previous tumor and subsequent resection. It appears that the midportion of the superior sagittal sinus has been resected. No evidence of tumor in the venous sinuses anterior or posterior to that.  From MRI on 5/21: Redemonstrated sequela of prior vertex craniotomy/cranioplasty and parafalcine meningioma resection with partial resection of the superior sagittal sinus. No evidence of residual/recurrent tumor at the resection site.   Chronic intracranial findings without interval change, as described.   Paranasal sinus disease, as outlined.       TODAY'S TREATMENT:  NMR  In // bars for improved transfers, lateral weightshifitng, RLE coordination and endurance: -Sit <>stand w/CGA and BUE support on // bars w/lateral weightshifting in standing to prime lateral weight shift required for stand pivot, x3 minutes. Pt able to raise R heel off ground slightly. Noted improved weightshift technique (no rotation)   Outside of // bars w/BUE support on single rail:  -Sit <>stand from power chair w/CGA, noted good anterior weight shift   -Lateral step in/out w/LLE x10, min cues for upright posture throughout. Attempted on R side and pt able to shift weight to L side and bring R heel off ground but unable to side step. Therapist attempted to move RLE for pt, but resulted in significant forward lean over bars, so halted to maintain upright posture.  -Stand <> sit w/CGA for safety due to poor eccentric control   Ther Ex  SciFit level 1 for 8 minutes while seated in power chair using BUE/BLEs for UE/LE coordination, improved ROM of BLE and global strengthening. Pt able to bring LLE into foot pedal independently, mod A for RLE. Pt demonstrated improved knee extension ROM bilaterally. RPE of 6/10 following activity.    PATIENT EDUCATION: Education details: Continue HEP and standing at  home   Person educated: Patient and Spouse Education method: Explanation, Demonstration, Tactile cues, and Verbal cues Education comprehension: verbalized understanding and needs further education   HOME EXERCISE PROGRAM: [email or web address]  Access Code: [email or web address] URL: [email or web address] Date: 07/30/2021 Prepared by: Mickie Bail Bertel Venard  Exercises - Modified Thomas Stretch  - 1 x daily - 7 x weekly - 3 sets - 4 reps - 45-60second hold - Supine Bridge  - 1 x daily - 7 x weekly - 3 sets - 10 reps   ASSESSMENT:   CLINICAL IMPRESSION: Emphasis of skilled PT session on transfers, lateral weight shifting and BLE strength. Pt able to perform sit <>stand w/CGA if there is a target placed in front of pt to remind him to shift weight anteriorly. Pt continues to require mod-max multimodal cues to off-weight RLE and maintain upright posture but was able to lift R heel off ground today. Continue POC.  OBJECTIVE IMPAIRMENTS decreased activity tolerance, decreased balance, decreased coordination, decreased endurance, decreased knowledge of use of DME, decreased mobility, difficulty walking, decreased ROM, decreased strength, hypomobility, impaired perceived functional ability, impaired flexibility, impaired UE functional use, and postural dysfunction.    ACTIVITY LIMITATIONS cleaning, community activity, meal prep, laundry, and medication management.    PERSONAL FACTORS Time since onset of injury/illness/exacerbation and 3+ comorbidities: anxiety, OA, cancer, subdural hematoma  are also affecting patient's functional outcome.      REHAB POTENTIAL: Fair due to amount of time since onset and previous progress with PT   CLINICAL DECISION MAKING: Evolving/moderate complexity   EVALUATION COMPLEXITY: Moderate     GOALS: Goals reviewed with patient? Yes  OLD LONG TERM GOALS:  Target date: 08/20/2021  Pt/caregiver  will be IND with final HEP in order to indicate improved functional mobility and dec fall risk. Baseline:  Goal status: IN PROGRESS  2.  Pt will perform sit <>stand from 20" mat table to bariatric RW w/Min A for improved BLE strength and transfers  Baseline: Min A from 23" mat  Goal status: IN PROGRESS  3.  Pt will ambulate 20' w/RW and body weight support as necessary w/mod A of single therapist for improved weightbearing tolerance and global strength  Baseline: 10' w/BW support and mod A x2  Goal status: IN PROGRESS  NEW LONG TERM GOALS FOR EXTENDED POC:  Target date: 09/20/2021  Pt/caregiver will be IND with final HEP in order to indicate improved functional mobility and dec fall risk. Baseline:  Goal status: ONGOING  2.  Pt will perform sit <>stand from 20" mat table to bariatric RW at S level for improved BLE strength and transfers  Baseline: Min A from 23" mat  Goal status: REVISED  3.  Pt will ambulate 25' w/RW and body weight support as necessary w/mod A of single therapist for improved weightbearing tolerance and global strength  Baseline: 29' w/BW support and mod A x2  Goal status: ONGOING  4.  Pt will perform stand pivot transfer with bari RW (and LE bracing as needed) R and L at min A level in order to increase functional independence at home.   Baseline:   Goal Status: INITIAL   PLAN: PT FREQUENCY: 2x/week   PT DURATION: 5 weeks (recert)   PLANNED INTERVENTIONS: Therapeutic exercises, Therapeutic activity, Neuromuscular re-education, Balance training, Gait training, Patient/Family education, Joint mobilization, Vestibular training, Orthotic/Fit training, DME instructions, and Manual therapy   PLAN FOR NEXT SESSION: Did they stand w/his RW? Body-weight support gait training, Continue gait training in // bars. Stand pivot with bari RW Raquel Sarna had good success using ACE wrap with DF assist to provide feedback and ease of moving foot on RLE). Provide handout of standing  marches for HEP. Practice sit <>stand in // bars with wife. Lateral weight shifting in standing, rolling using PNF principles. Lateral scooting on mat, sit <>supine transfers, add strengthening to HEP as able. In therapy, use stedy for weight shifting, reaching,  trunk dissociation, sit <>stands in // bars.  He did so well seated in w/c facing mat, placing elbows on mat and lifting buttocks>coming to stand>weight shifting.  If we ever have +2A, I would love to try and get him prone.  He did not have a great experience with it in the past, but UEs were not positioned well.     Charlett Nose, PT, [facility] [address] [place], [place]  [address] Phone:  [phone] Fax:  [phone]  09/07/21, 1:23 PM

## 2021-09-08 ENCOUNTER — Ambulatory Visit: Payer: Medicare Other | Admitting: Occupational Therapy

## 2021-09-08 DIAGNOSIS — R29898 Other symptoms and signs involving the musculoskeletal system: Secondary | ICD-10-CM

## 2021-09-08 DIAGNOSIS — M6281 Muscle weakness (generalized): Secondary | ICD-10-CM | POA: Diagnosis not present

## 2021-09-08 DIAGNOSIS — R208 Other disturbances of skin sensation: Secondary | ICD-10-CM

## 2021-09-08 DIAGNOSIS — R293 Abnormal posture: Secondary | ICD-10-CM

## 2021-09-08 DIAGNOSIS — M25621 Stiffness of right elbow, not elsewhere classified: Secondary | ICD-10-CM

## 2021-09-08 DIAGNOSIS — R2689 Other abnormalities of gait and mobility: Secondary | ICD-10-CM

## 2021-09-08 DIAGNOSIS — M25611 Stiffness of right shoulder, not elsewhere classified: Secondary | ICD-10-CM

## 2021-09-08 DIAGNOSIS — R251 Tremor, unspecified: Secondary | ICD-10-CM

## 2021-09-08 DIAGNOSIS — R278 Other lack of coordination: Secondary | ICD-10-CM

## 2021-09-08 DIAGNOSIS — R2681 Unsteadiness on feet: Secondary | ICD-10-CM
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
## 2021-09-09 ENCOUNTER — Encounter: Payer: Medicare Other | Attending: Physical Medicine & Rehabilitation | Admitting: Physical Medicine & Rehabilitation

## 2021-09-09 ENCOUNTER — Encounter: Payer: Self-pay | Admitting: Physical Medicine & Rehabilitation

## 2021-09-09 VITALS — BP 136/83 | HR 61

## 2021-09-09 DIAGNOSIS — G8250 Quadriplegia, unspecified: Secondary | ICD-10-CM | POA: Diagnosis present

## 2021-09-09 DIAGNOSIS — D496 Neoplasm of unspecified behavior of brain: Secondary | ICD-10-CM | POA: Insufficient documentation

## 2021-09-09 DIAGNOSIS — R569 Unspecified convulsions: Secondary | ICD-10-CM | POA: Insufficient documentation

## 2021-09-09 DIAGNOSIS — D329 Benign neoplasm of meninges, unspecified: Secondary | ICD-10-CM | POA: Insufficient documentation

## 2021-09-09 MED ORDER — CARBIDOPA-LEVODOPA 10-100 MG PO TABS: 1.0000 | ORAL_TABLET | Freq: Two times a day (BID) | ORAL | 2 refills | Status: DC

## 2021-09-09 NOTE — Patient Instructions (Addendum)
PLEASE FEEL FREE TO CALL OUR OFFICE WITH ANY PROBLEMS OR QUESTIONS ([phone])  OPTIONS FOR SWELLING:  ACE WRAP 4" ZIP UP COMPRESSION STOCKINGS VELCRO STOCKINS     SINEMET DOSING: (FOR TREMOR) START ONE AT BEDTIME FOR 4 DAYS THEN TWICE DAILY.
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
## 2021-09-10 ENCOUNTER — Ambulatory Visit: Payer: Medicare Other | Admitting: Physical Therapy

## 2021-09-10 DIAGNOSIS — M6281 Muscle weakness (generalized): Secondary | ICD-10-CM | POA: Diagnosis not present

## 2021-09-10 DIAGNOSIS — R2689 Other abnormalities of gait and mobility: Secondary | ICD-10-CM

## 2021-09-10 NOTE — Therapy (Signed)
OUTPATIENT PHYSICAL THERAPY TREATMENT NOTE  Patient Name: Anthony Downs MRN: [id number] DOB:1950-04-12, 71 y.o., male Today's Date: 09/10/2021  PCP: Redmond School, MD REFERRING PROVIDER: [name], MD      PT End of Session - 09/10/21 1450     Visit Number 42    Number of Visits 43   per updated POC   Date for PT Re-Evaluation 14/43/15   Recert   Authorization Type Medicare and [place] (needs 10th visit progress note)    Progress Note Due on Visit 51    PT Start Time 1448    PT Stop Time 1536    PT Time Calculation (min) 48 min    Equipment Utilized During Treatment --    Activity Tolerance Patient tolerated treatment well    Behavior During Therapy WFL for tasks assessed/performed                  Past Medical History:  Diagnosis Date   Acid reflux    Anxiety    Arthritis    Asthma    as a child   Basal cell carcinoma 01/26/2021   RIGHT POST SURICULAR INFERIOR   Basal cell carcinoma 01/26/2021   RIGHT POST AURICULAR SUPERIOR   Benign brain tumor ([place])    Cancer ([place])    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, squamous behind ear   Complication of anesthesia    slow to wake up and pain medicines make him sick   GERD (gastroesophageal reflux disease)    Hypercholesteremia    Hypertension    PONV (postoperative nausea and vomiting)    Sleep apnea    no Cpap   Subdural hematoma, post-traumatic ([facility]) 2008   fell off truck hit head on concrete   Past Surgical History:  Procedure Laterality Date   APPLICATION OF CRANIAL NAVIGATION Left 04/29/2020   Procedure: [place];  Surgeon: Judith Part, MD;  Location: [place];  Service: Neurosurgery;  Laterality: Left;   arthroscopic knee     COLONOSCOPY N/A 11/29/2018   Procedure: COLONOSCOPY;  Surgeon: Rogene Houston, MD;  [facility];  Service: Endoscopy;  Laterality: N/A;  730-rescheduled 9/2 same time per Ann   CRANIOTOMY Left 04/29/2020   Procedure: LEFT  CRANIECTOMY WITH TUMOR EXCISION;  Surgeon: Judith Part, MD;  Location: [place];  Service: Neurosurgery;  Laterality: Left;   KNEE SURGERY Right    NASAL SEPTOPLASTY W/ TURBINOPLASTY Bilateral 03/19/2020   Procedure: NASAL SEPTOPLASTY WITH BILATERAL  TURBINATE REDUCTION;  Surgeon: Leta Baptist, MD;  Location: [place];  Service: ENT;  Laterality: Bilateral;   VASECTOMY     Patient Active Problem List   Diagnosis Date Noted   Seizure ([place]) 08/13/2020   Seizures ([place]) 08/12/2020   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50 (ICD-10-CM) - 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[place]) R56.9 (ICD-10-CM) - Seizures ([place])    THERAPY DIAG:  Other  abnormalities of gait and mobility   PERTINENT HISTORY: anxiety, OA, cancer, subdural hematoma     PRECAUTIONS: Fall  SUBJECTIVE: Pt reports continued R shoulder pain. Stood w/RW for 4 minutes but still requires max encouragement from [place] to perform sit <>stands at home.   PAIN:  Are you having pain? 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 There are mild chronic small-vessel ischemic changes of the white matter. There is atrophy, encephalomalacia and gliosis  along the medial aspects of both sides of the brain at the frontoparietal vertex, sequela of the previous tumor and subsequent resection. It appears that the midportion of the superior sagittal sinus has been resected. No evidence of tumor in the venous sinuses anterior or posterior to that.  From MRI on 5/21: Redemonstrated sequela of prior vertex craniotomy/cranioplasty and parafalcine meningioma resection with partial resection of the superior sagittal sinus. No evidence of residual/recurrent tumor at the resection site.   Chronic intracranial findings without interval change, as described.   Paranasal sinus disease, as outlined.       TODAY'S TREATMENT:  Self-care/home management Entirety of session spent discussing POC moving forward with pt and his wife, [name]. At this time, pt has not been performing HEP (sit <>stands, standing tolerance) at home unless [name] "forces" him. Informed pt that he has essentially hit a plateau w/PT due to not practicing sit <>stands w/bariatric RW or working on stamina at home, which significantly limits therapy sessions and progression of skilled interventions and gait training, which is pt's goal. Pt continues to require min A for sit <>stands and max multimodal cues for upright posture and correction of posterior lean due to extensor tone. Therapist informed pt and wife that until pt has built up his weightbearing tolerance and global strength w/sit <>stands, gait training and therapy in clinic will need to go on pause. Pt and wife verbalized understanding and are in agreement that pt needs to work on sit <>stands at home consistently using bariatric RW and // bars prior to resuming therapy, as pt unmotivated to work on standing at home and thus limiting his progress in PT.  Informed pt that if he hopes to participate in aquatic therapy, he must work on transfers and stamina at home in order to safely transfer into/out of pool and obtain max benefit from pool  session. Of note, this therapist not recommending aquatic therapy for pt at this time due to safety concerns, amount of assistance required to perform transfer into/out of pool and pt's poor activity tolerance. Pt and wife verbalized concerns w/entering and exiting pool, as pt has frequently required max A +2 for transfers following sessions or attempted to sit mid-air in therapy sessions without warning due to fatigue, requiring 3-4 therapists plus body-weight support system to prevent fall or injury. Informed pt and wife that multiple therapists would be present to assist with transfer but this therapist not familiar with layout of pool. Informed pt that aquatic therapist attempting to set up therapy in separate facility from [facility] due to better layout of pool and safety, but new facility would not be set up for several weeks at minimum. Thus, pt would benefit from using that time to work on strengthening at home.  Discussed significance of caregiver burnout, as transporting  pt to therapy sessions requires hours of prep from [place] and pt currently only performing interventions in therapy that he can perform at home. Strongly emphasized importance of pt finding internal motivation to perform HEP at home, as he has made significant gains in therapy but needs to show he will practice transfers at home for improved carryover and maintenance of functional gains to justify continuing PT. Also informed pt that it is not [name]'s job to stand pt, but rather for pt to find internal drive and motivation to work on PT at home. Pt verbalized understanding and agreement and stated he would begin to prioritize working on transfers. Provided therapeutic listening and encouragement to pt and [name], as reducing caregiver burnout is crucial for continuation of therapy and mental health. Pt and wife verbalized understanding and appreciation.  Pt and wife in agreement to pause therapy for at least 8-12 weeks to allow pt to have  time to work on transfers and stamina at home prior to returning to continue gait training and reassess possibility of aquatic therapy. OT also in agreement that pt has hit plateau and will benefit from performing exercises at home regularly prior to returning to clinic.    PATIENT EDUCATION: Education details: See above   Person educated: Patient and Spouse Education method: Explanation, Demonstration, Tactile cues, and Verbal cues Education comprehension: verbalized understanding and needs further education   HOME EXERCISE PROGRAM: [email or web address]  Access Code: [email or web address] URL: [email or web address] Date: 07/30/2021 Prepared by: Mickie Bail Blaize Nipper  Exercises - Modified [place] Stretch  - 1 x daily - 7 x weekly - 3 sets - 4 reps - 45-60second hold - Supine Bridge  - 1 x daily - 7 x weekly - 3 sets - 10 reps   ASSESSMENT:   CLINICAL IMPRESSION: Emphasis of skilled PT session on pt and family education and discussing POC. At this time, therapist recommending pt pause therapy for 8-12 weeks minimum to work on transfers and stamina at home in order to continue gait training and assess potential for aquatic therapy in clinic. Pt has demonstrated noncompliance w/HEP throughout bout of therapy, requiring max encouragement from [place] to perform exercises at home. See above for more details. Pt and wife in agreement to pause therapy following next session to allow pt time to work on transfers at home so therapy can progress. Continue POC.    OBJECTIVE IMPAIRMENTS decreased activity tolerance, decreased balance, decreased coordination, decreased endurance, decreased knowledge of use of DME, decreased mobility, difficulty walking, decreased ROM, decreased strength, hypomobility, impaired perceived functional ability, impaired flexibility, impaired UE functional use, and postural dysfunction.    ACTIVITY LIMITATIONS  cleaning, community activity, meal prep, laundry, and medication management.    PERSONAL FACTORS Time since onset of injury/illness/exacerbation and 3+ comorbidities: anxiety, OA, cancer, subdural hematoma  are also affecting patient's functional outcome.      REHAB POTENTIAL: Fair due to amount of time since onset and previous progress with PT   CLINICAL DECISION MAKING: Evolving/moderate complexity   EVALUATION COMPLEXITY: Moderate     GOALS: Goals reviewed with patient? Yes  OLD LONG TERM GOALS:  Target date: 08/20/2021  Pt/caregiver will be IND with final HEP in order to indicate improved functional mobility and dec fall risk. Baseline:  Goal status: IN PROGRESS  2.  Pt will perform sit <>stand from 20" mat table to bariatric RW w/Min A for improved BLE strength and transfers  Baseline: Min A from 23" mat  Goal status: IN PROGRESS  3.  Pt will  ambulate 20' w/RW and body weight support as necessary w/mod A of single therapist for improved weightbearing tolerance and global strength  Baseline: 10' w/BW support and mod A x2  Goal status: IN PROGRESS  NEW LONG TERM GOALS FOR EXTENDED POC:  Target date: 09/20/2021  Pt/caregiver will be IND with final HEP in order to indicate improved functional mobility and dec fall risk. Baseline:  Goal status: ONGOING  2.  Pt will perform sit <>stand from 20" mat table to bariatric RW at S level for improved BLE strength and transfers  Baseline: Min A from 23" mat  Goal status: REVISED  3.  Pt will ambulate 25' w/RW and body weight support as necessary w/mod A of single therapist for improved weightbearing tolerance and global strength  Baseline: 77' w/BW support and mod A x2  Goal status: ONGOING  4.  Pt will perform stand pivot transfer with bari RW (and LE bracing as needed) R and L at min A level in order to increase functional independence at home.   Baseline:   Goal Status: INITIAL   PLAN: PT FREQUENCY: 2x/week   PT DURATION: 5  weeks (recert)   PLANNED INTERVENTIONS: Therapeutic exercises, Therapeutic activity, Neuromuscular re-education, Balance training, Gait training, Patient/Family education, Joint mobilization, Vestibular training, Orthotic/Fit training, DME instructions, and Manual therapy   PLAN FOR NEXT SESSION: Goal assessment, pause therapy     Cruzita Lederer Liahm Grivas, PT, DPT [facility] [address] [place], [place]  [address] Phone:  [phone] Fax:  [phone] 09/10/21, 4:26 PM

## 2021-09-14 ENCOUNTER — Ambulatory Visit: Payer: Medicare Other | Admitting: Occupational Therapy

## 2021-09-16 ENCOUNTER — Ambulatory Visit: Payer: Medicare Other | Admitting: Physical Therapy

## 2021-09-16 ENCOUNTER — Ambulatory Visit: Payer: Medicare Other | Admitting: Occupational Therapy

## 2021-09-16 DIAGNOSIS — M25611 Stiffness of right shoulder, not elsewhere classified: Secondary | ICD-10-CM

## 2021-09-16 DIAGNOSIS — M6281 Muscle weakness (generalized): Secondary | ICD-10-CM

## 2021-09-16 DIAGNOSIS — R293 Abnormal posture: Secondary | ICD-10-CM

## 2021-09-16 DIAGNOSIS — R2689 Other abnormalities of gait and mobility: Secondary | ICD-10-CM

## 2021-09-16 DIAGNOSIS — M25621 Stiffness of right elbow, not elsewhere classified: Secondary | ICD-10-CM

## 2021-09-16 DIAGNOSIS — R278 Other lack of coordination: Secondary | ICD-10-CM

## 2021-09-16 DIAGNOSIS — R2681 Unsteadiness on feet: Secondary | ICD-10-CM

## 2021-09-16 DIAGNOSIS — R251 Tremor, unspecified: Secondary | ICD-10-CM

## 2021-09-16 DIAGNOSIS — R208 Other disturbances of skin sensation: Secondary | ICD-10-CM

## 2021-09-16 DIAGNOSIS — R29898 Other symptoms and signs involving the musculoskeletal system: Secondary | ICD-10-CM

## 2021-09-16 NOTE — Therapy (Signed)
OUTPATIENT PHYSICAL THERAPY TREATMENT NOTE- DISCHARGE SUMMARY   Patient Name: Anthony Downs MRN: [id number] DOB:09-05-1950, 71 y.o., male Today's Date: 09/16/2021  PCP: Redmond School, MD REFERRING PROVIDER: [name], MD   PHYSICAL THERAPY DISCHARGE SUMMARY  Visits from Start of Care: 81  Current functional level related to goals / functional outcomes: Pt continues to require min-mod A for sit <>stands, bed mobility and lateral transfers.   Remaining deficits: Decreased strength, poor activity tolerance, poor postural control, poor weightbearing tolerance, impaired body mechanics    Education / Equipment: HEP    Patient agrees to discharge. Patient goals were not met. Patient is being discharged due to  reaching plateau/max potential in therapy due to not performing HEP at home.      PT End of Session - 09/16/21 1149     Visit Number 43    Number of Visits 43   per updated POC   Date for PT Re-Evaluation 95/62/13   Recert   Authorization Type Medicare and [place] (needs 10th visit progress note)    Progress Note Due on Visit 73    PT Start Time 1147    PT Stop Time 1234    PT Time Calculation (min) 47 min    Equipment Utilized During Treatment Gait belt    Activity Tolerance Patient tolerated treatment well    Behavior During Therapy WFL for tasks assessed/performed                   Past Medical History:  Diagnosis Date   Acid reflux    Anxiety    Arthritis    Asthma    as a child   Basal cell carcinoma 01/26/2021   RIGHT POST SURICULAR INFERIOR   Basal cell carcinoma 01/26/2021   RIGHT POST AURICULAR SUPERIOR   Benign brain tumor ([place])    Cancer ([place])    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, squamous behind ear   Complication of anesthesia    slow to wake up and pain medicines make him sick   GERD (gastroesophageal reflux disease)    Hypercholesteremia    Hypertension    PONV (postoperative nausea and vomiting)    Sleep apnea    no  Cpap   Subdural hematoma, post-traumatic ([place]) 2008   fell off truck hit head on concrete   Past Surgical History:  Procedure Laterality Date   APPLICATION OF CRANIAL NAVIGATION Left 04/29/2020   Procedure: [place];  Surgeon: Judith Part, MD;  Location: [facility];  Service: Neurosurgery;  Laterality: Left;   arthroscopic knee     COLONOSCOPY N/A 11/29/2018   Procedure: COLONOSCOPY;  Surgeon: Rogene Houston, MD;  [facility];  Service: Endoscopy;  Laterality: N/A;  730-rescheduled 9/2 same time per Ann   CRANIOTOMY Left 04/29/2020   Procedure: LEFT CRANIECTOMY WITH TUMOR EXCISION;  Surgeon: Judith Part, MD;  Location: [facility];  Service: Neurosurgery;  Laterality: Left;   KNEE SURGERY Right    NASAL SEPTOPLASTY W/ TURBINOPLASTY Bilateral 03/19/2020   Procedure: NASAL SEPTOPLASTY WITH BILATERAL  TURBINATE REDUCTION;  Surgeon: Leta Baptist, MD;  Location: [facility];  Service: ENT;  Laterality: Bilateral;   VASECTOMY     Patient Active Problem List   Diagnosis Date Noted   Seizure ([place]) 08/13/2020   Seizures ([place]) 08/12/2020   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   DVT (deep venous thrombosis) ([place]) 08/12/2020   COVID-19 virus infection 08/12/2020   Acute lower UTI 07/14/2020  Delirium 07/06/2020   Sleep apnea 07/06/2020   GERD (gastroesophageal reflux disease) 07/06/2020   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[place]) 06/25/2020   Sundowning 06/25/2020   Slow transit constipation    Essential hypertension    Hypokalemia    Postoperative pain    Meningioma ([place]) 05/02/2020   Brain tumor ([place]) 04/28/2020   S/P nasal septoplasty 03/19/2020   Special screening for malignant neoplasms, colon 01/09/2018    REFERRING DIAG: D32.9 (ICD-10-CM) - Meningioma ([facility]) G82.50 (ICD-10-CM) - 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[place]) R56.9 (ICD-10-CM) - Seizures ([place])    THERAPY DIAG:  Other abnormalities of gait and mobility  Muscle weakness (generalized)  Abnormal posture   PERTINENT HISTORY:  anxiety, OA, cancer, subdural hematoma     PRECAUTIONS: Fall  SUBJECTIVE: Pt and wife reports he stood to the // bars this weekend and held for ~4 minutes w/hands supported on shoulders of wife's friend to facilitate upright posture. Pt continues to require max encouragement to perform standing/HEP at home, resulting in tension between [place] and pt.   PAIN:  Are you having pain? 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 There are mild chronic small-vessel ischemic changes of the white matter. There is atrophy, encephalomalacia and gliosis along the medial aspects of both sides of the brain at the frontoparietal vertex, sequela of the previous tumor and subsequent resection. It appears that the midportion of the superior sagittal sinus has been resected. No evidence of tumor in the venous sinuses anterior or posterior to that.  From MRI on 5/21: Redemonstrated sequela of prior vertex craniotomy/cranioplasty and parafalcine meningioma resection with partial resection of the superior sagittal sinus. No evidence of residual/recurrent tumor at the resection site.   Chronic intracranial findings without interval change, as described.   Paranasal sinus disease, as outlined.       TODAY'S TREATMENT:  Self-care/home management Reiterated conversation from previous session regarding importance of pt performing his HEP at home and finding internal motivation to work on weightbearing tolerance and strength, as therapy cannot perform gait training if pt cannot tolerate static standing.  [name] reports pt's A1c is elevated and pt will be starting healthier diet to assist w/weight loss and A1c.  Pt started Sinemet yesterday to assist w/RUE tremor. Brief education regarding side effects of medication to  reiterate education from Dr. Naaman Plummer.  Informed [name] to obtain new PT referral when pt sees Dr. Naaman Plummer in November. If they feel as though they need to come back to PT sooner than that, informed pt and wife that they can call Dr. Naaman Plummer to obtain referral. Pt and wife verbalized understanding.  Informed pt that PT is discharging as this is better for Medicare and starting new POC. Pt verbalized understanding.   Therapeutic Activity  LTG Assessment  -Pt performed lateral scoot transfer from power chair to 20" mat table on L side w/min A for RLE management and cues for proper body mechanics/sequencing. Noted poor hip clearance w/each scoot despite cues to shift weight forward  -Attempted sit <>stand from 20" mat table to bariatric RW, but pt unable to perform without min A from [place]. Noted poor foot placement (R foot forward, L foot too far posterior) but pt refused to fix feet and stated it was the only way he can stand. Pt frequently attempting to pull up on RW rather than push up form  mat table, requiring cues to correct hand placement. Once standing, pt able to hold for ~4 minutes w/min multimodal cues for upright posture. Stand <>sit w/poor eccentric control onto mat.    PATIENT EDUCATION: Education details: Goal assessment, obtaining new referral for PT in future, see self-care section   Person educated: Patient and Spouse Education method: Explanation, Demonstration, Tactile cues, and Verbal cues Education comprehension: verbalized understanding   HOME EXERCISE PROGRAM: [email or web address]  Access Code: [email or web address] URL: [email or web address] Date: 07/30/2021 Prepared by: Mickie Bail Tauni Sanks  Exercises - Modified Thomas Stretch  - 1 x daily - 7 x weekly - 3 sets - 4 reps - 45-60second hold - Supine Bridge  - 1 x daily - 7 x weekly - 3 sets - 10 reps   ASSESSMENT:   CLINICAL IMPRESSION: Emphasis of skilled PT  session on LTG assessment and DC from PT. Pt has met 1/4 LTGs due to [place] being independent w/implementing HEP at home. Pt unable to perform sit <>stand without physical assistance or perform stand pivot transfer without 3-4 therapists due to BLE weakness, poor postural control and poor activity tolerance. Therapy has discontinued gait training in clinic due to pt having poor weightbearing tolerance. Pt continues to be noncompliant w/HEP at home, limiting his progress in PT and requiring max encouragement from [place] to perform. At this time, therapist recommending pt DC from therapy for at least 2-3 months to work on standing tolerance and transfers at home for improved functional strength to restart gait training and reduced caregiver burden w/transporting pt to appointments. Pt and wife verbalized understanding and agreement to DC today.   OBJECTIVE IMPAIRMENTS decreased activity tolerance, decreased balance, decreased coordination, decreased endurance, decreased knowledge of use of DME, decreased mobility, difficulty walking, decreased ROM, decreased strength, hypomobility, impaired perceived functional ability, impaired flexibility, impaired UE functional use, and postural dysfunction.    ACTIVITY LIMITATIONS cleaning, community activity, meal prep, laundry, and medication management.    PERSONAL FACTORS Time since onset of injury/illness/exacerbation and 3+ comorbidities: anxiety, OA, cancer, subdural hematoma  are also affecting patient's functional outcome.      REHAB POTENTIAL: Fair due to amount of time since onset and previous progress with PT   CLINICAL DECISION MAKING: Evolving/moderate complexity   EVALUATION COMPLEXITY: Moderate     GOALS: Goals reviewed with patient? Yes  OLD LONG TERM GOALS:  Target date: 08/20/2021  Pt/caregiver will be IND with final HEP in order to indicate improved functional mobility and dec fall risk. Baseline:  Goal status: IN PROGRESS  2.  Pt will  perform sit <>stand from 20" mat table to bariatric RW w/Min A for improved BLE strength and transfers  Baseline: Min A from 23" mat  Goal status: NOT MET  3.  Pt will ambulate 20' w/RW and body weight support as necessary w/mod A of single therapist for improved weightbearing tolerance and global strength  Baseline: 10' w/BW support and mod A x2  Goal status: NOT MET  NEW LONG TERM GOALS FOR EXTENDED POC:  Target date: 09/20/2021  Pt/caregiver will be IND with final HEP in order to indicate improved functional mobility and dec fall risk. Baseline:  Goal status: MET  2.  Pt will perform sit <>stand from 20" mat table to bariatric RW at S level for improved BLE strength and transfers  Baseline: Min A from 23" mat; min A fro 20" mat  Goal status: NOT MET  3.  Pt will ambulate 25' w/RW and body weight support as necessary w/mod A  of single therapist for improved weightbearing tolerance and global strength  Baseline: 95' w/BW support and mod A x2  Goal status: NOT MET  4.  Pt will perform stand pivot transfer with bari RW (and LE bracing as needed) R and L at min A level in order to increase functional independence at home.   Baseline:   Goal Status: NOT MET   PLAN: PT FREQUENCY: 2x/week   PT DURATION: 5 weeks (recert)   PLANNED INTERVENTIONS: Therapeutic exercises, Therapeutic activity, Neuromuscular re-education, Balance training, Gait training, Patient/Family education, Joint mobilization, Vestibular training, Orthotic/Fit training, DME instructions, and Manual therapy     [place], PT, DPT [facility] [address] [place], [place]  [address] Phone:  [phone] Fax:  [phone] 09/16/21, 1:55 PM
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
## 2021-09-18 ENCOUNTER — Ambulatory Visit: Payer: Medicare Other | Admitting: Physical Therapy

## 2021-11-04 ENCOUNTER — Other Ambulatory Visit: Payer: Self-pay

## 2021-11-04 MED ORDER — FINASTERIDE 5 MG PO TABS: 5.0000 mg | ORAL_TABLET | Freq: Every day | ORAL | 1 refills | Status: DC

## 2021-11-04 NOTE — Progress Notes (Signed)
Patient's wife called to request refill of finasteride.  Rx resent with 1 refill.

## 2021-11-23 ENCOUNTER — Other Ambulatory Visit (HOSPITAL_COMMUNITY): Payer: Self-pay | Admitting: Internal Medicine

## 2021-11-23 ENCOUNTER — Ambulatory Visit (HOSPITAL_COMMUNITY)
Admission: RE | Admit: 2021-11-23 | Discharge: 2021-11-23 | Disposition: A | Discharge status: Home / Self Care | Payer: Medicare Other | Source: Ambulatory Visit | Attending: Internal Medicine | Admitting: Internal Medicine

## 2021-11-23 DIAGNOSIS — R051 Acute cough: Secondary | ICD-10-CM | POA: Insufficient documentation

## 2021-11-23 DIAGNOSIS — J9801 Acute bronchospasm: Secondary | ICD-10-CM | POA: Insufficient documentation

## 2021-11-26 ENCOUNTER — Other Ambulatory Visit: Payer: Self-pay

## 2021-11-26 ENCOUNTER — Emergency Department (HOSPITAL_COMMUNITY)
Admission: EM | Admit: 2021-11-26 | Discharge: 2021-11-27 | Disposition: A | Discharge status: Home / Self Care | Payer: Medicare Other | Attending: Emergency Medicine | Admitting: Emergency Medicine

## 2021-11-26 ENCOUNTER — Emergency Department (HOSPITAL_COMMUNITY): Payer: Medicare Other

## 2021-11-26 DIAGNOSIS — R059 Cough, unspecified: Secondary | ICD-10-CM | POA: Diagnosis present

## 2021-11-26 DIAGNOSIS — Z7901 Long term (current) use of anticoagulants: Secondary | ICD-10-CM | POA: Insufficient documentation

## 2021-11-26 DIAGNOSIS — R1084 Generalized abdominal pain: Secondary | ICD-10-CM | POA: Diagnosis not present

## 2021-11-26 DIAGNOSIS — Z20822 Contact with and (suspected) exposure to covid-19: Secondary | ICD-10-CM | POA: Insufficient documentation

## 2021-11-26 DIAGNOSIS — R112 Nausea with vomiting, unspecified: Secondary | ICD-10-CM | POA: Diagnosis not present

## 2021-11-26 DIAGNOSIS — J069 Acute upper respiratory infection, unspecified: Secondary | ICD-10-CM | POA: Insufficient documentation

## 2021-11-26 LAB — CBC
HCT: 40.2 % (ref 39.0–52.0)
Hemoglobin: 12.8 g/dL — ABNORMAL LOW (ref 13.0–17.0)
MCH: 30.5 pg (ref 26.0–34.0)
MCHC: 31.8 g/dL (ref 30.0–36.0)
MCV: 95.9 fL (ref 80.0–100.0)
Platelets: 181 10*3/uL (ref 150–400)
RBC: 4.19 MIL/uL — ABNORMAL LOW (ref 4.22–5.81)
RDW: 13.6 % (ref 11.5–15.5)
WBC: 8.4 10*3/uL (ref 4.0–10.5)
nRBC: 0 % (ref 0.0–0.2)

## 2021-11-26 LAB — RESP PANEL BY RT-PCR (FLU A&B, COVID) ARPGX2
Influenza A by PCR: NEGATIVE
Influenza B by PCR: NEGATIVE
SARS Coronavirus 2 by RT PCR: NEGATIVE

## 2021-11-26 LAB — COMPREHENSIVE METABOLIC PANEL
ALT: 34 U/L (ref 0–44)
AST: 22 U/L (ref 15–41)
Albumin: 3.5 g/dL (ref 3.5–5.0)
Alkaline Phosphatase: 72 U/L (ref 38–126)
Anion gap: 7 (ref 5–15)
BUN: 10 mg/dL (ref 8–23)
CO2: 30 mmol/L (ref 22–32)
Calcium: 9 mg/dL (ref 8.9–10.3)
Chloride: 105 mmol/L (ref 98–111)
Creatinine, Ser: 1.07 mg/dL (ref 0.61–1.24)
GFR, Estimated: >60 mL/min (ref >60)
Glucose, Bld: 160 mg/dL — ABNORMAL HIGH (ref 70–99)
Potassium: 4 mmol/L (ref 3.5–5.1)
Sodium: 142 mmol/L (ref 135–145)
Total Bilirubin: 0.5 mg/dL (ref 0.3–1.2)
Total Protein: 6.6 g/dL (ref 6.5–8.1)

## 2021-11-26 LAB — LIPASE, BLOOD: Lipase: 26 U/L (ref 11–51)

## 2021-11-26 LAB — LACTIC ACID, PLASMA: Lactic Acid, Venous: 1 mmol/L (ref 0.5–1.9)

## 2021-11-26 MED ORDER — GUAIFENESIN-DM 100-10 MG/5ML PO SYRP
15.0000 mL | ORAL_SOLUTION | Freq: Once | ORAL | Status: AC
  Administered 2021-11-27: 15 mL via ORAL
  Filled 2021-11-26: qty 15

## 2021-11-26 MED ORDER — ONDANSETRON HCL 4 MG/2ML IJ SOLN
4.0000 mg | Freq: Once | INTRAMUSCULAR | Status: AC
  Administered 2021-11-27: 4 mg via INTRAVENOUS
  Filled 2021-11-26: qty 2

## 2021-11-26 NOTE — ED Triage Notes (Signed)
Pt has had N/V for 2 days with cough.  Pt had Xray on Monday.  Pt has right sided deficits from brain tumor removal in April 2022.   Pt states he has some wheezing for 2 weeks.

## 2021-11-26 NOTE — ED Provider Notes (Addendum)
[facility] EMERGENCY DEPARTMENT Provider Note   CSN: [id number] Arrival date & time: 11/26/21  2127     History  Chief Complaint  Patient presents with   Emesis   Cough    Anthony Downs is a 71 y.o. male.  Pt with prod cough in the past 2 weeks, initially greenish sputum, was on abx therapy a week ago, symptoms improved but never resolved, and now persistent cough. No sore throat or runny nose. No headache. No neck pain or stiffness. No chest pan or discomfort. No sob.  Did have two episodes emesis yesterday, and another episode this evening, not bloody or bilious. Having normal bms. Intermittent abd cramping. No gu c/o. No extremity pain or swelling. No specific known ill contacts. No fever or chills.   The history is provided by the patient, medical records and the spouse.  Emesis Associated symptoms: abdominal pain and cough   Associated symptoms: no fever, no headaches and no sore throat   Cough Associated symptoms: no chest pain, no fever, no headaches, no rash, no shortness of breath and no sore throat        Home Medications Prior to Admission medications   Medication Sig Start Date End Date Taking? Authorizing Provider  atorvastatin (LIPITOR) 10 MG tablet Take 1 tablet (10 mg total) by mouth at bedtime. 06/17/20   Love, Ivan Anchors, PA-C  carbidopa-levodopa (SINEMET) 10-100 MG tablet Take 1 tablet by mouth 2 (two) times daily. 09/09/21 09/09/22  [name], MD  cetirizine (ZYRTEC) 10 MG tablet Take 10 mg by mouth daily.    [provider]  cyanocobalamin (VITAMIN B12) 1000 MCG tablet Take 1,000 mcg by mouth daily.    [provider]  divalproex (DEPAKOTE) 500 MG DR tablet Take 1 tablet (500 mg total) by mouth 2 (two) times daily. Patient taking differently: Take 500 mg by mouth once. One tablet at night 02/26/21 09/09/21  Alric Ran, MD  finasteride (PROSCAR) 5 MG tablet Take 1 tablet (5 mg total) by mouth daily. 11/04/21   Festus Aloe,  MD  furosemide (LASIX) 20 MG tablet Take 1 tablet (20 mg total) by mouth daily. 06/17/20   Love, Ivan Anchors, PA-C  pantoprazole (PROTONIX) 40 MG tablet Take 1 tablet (40 mg total) by mouth daily. 06/17/20   Love, Ivan Anchors, PA-C  potassium chloride SA (KLOR-CON) 20 MEQ tablet Take 1 tablet (20 mEq total) by mouth daily. 06/17/20   Love, Ivan Anchors, PA-C  propranolol (INDERAL) 20 MG tablet Take 1 tablet (20 mg total) by mouth 3 (three) times daily. 06/17/20   Love, Ivan Anchors, PA-C  Propylene Glycol-Glycerin (SOOTHE OP) Apply 1 drop to eye daily as needed (dry eyes).    [provider]  rivaroxaban (XARELTO) 20 MG TABS tablet Take 1 tablet (20 mg total) by mouth daily with supper. 06/17/20   Love, Ivan Anchors, PA-C  tamsulosin (FLOMAX) 0.4 MG CAPS capsule TAKE 2 CAPSULES BY MOUTH AT BEDTIME. 09/29/20   [name], MD  thiamine 100 MG tablet Take 1 tablet (100 mg total) by mouth daily. 07/16/20   Annita Brod, MD  traZODone (DESYREL) 50 MG tablet Take 2 tablets (100 mg total) by mouth at bedtime. Patient taking differently: Take 50 mg by mouth at bedtime. 09/11/20 03/04/21  Alric Ran, MD  zinc gluconate 50 MG tablet Take 50 mg by mouth daily.    [provider]      Allergies    Ativan [lorazepam] and Haldol [  haloperidol]    Review of Systems   Review of Systems  Constitutional:  Negative for fever.  HENT:  Negative for sore throat.   Eyes:  Negative for redness.  Respiratory:  Positive for cough. Negative for shortness of breath.   Cardiovascular:  Negative for chest pain.  Gastrointestinal:  Positive for abdominal pain, nausea and vomiting.  Genitourinary:  Negative for dysuria and flank pain.  Musculoskeletal:  Negative for back pain and neck pain.  Skin:  Negative for rash.  Neurological:  Negative for headaches.  Hematological:  Does not bruise/bleed easily.  Psychiatric/Behavioral:  Negative for confusion.     Physical Exam Updated Vital Signs BP 139/63    Pulse (!) 58   Temp 98.2 F (36.8 C)   Resp 20   Ht 1.854 m ('6\' 1"'$ )   Wt 119.3 kg   SpO2 94%   BMI 34.70 kg/m  Physical Exam Vitals and nursing note reviewed.  Constitutional:      Appearance: Normal appearance. He is well-developed.  HENT:     Head: Atraumatic.     Right Ear: Tympanic membrane normal.     Left Ear: Tympanic membrane normal.     Nose: Nose normal.     Mouth/Throat:     Mouth: Mucous membranes are moist.     Pharynx: Oropharynx is clear.  Eyes:     General: No scleral icterus.    Conjunctiva/sclera: Conjunctivae normal.     Pupils: Pupils are equal, round, and reactive to light.  Neck:     Trachea: No tracheal deviation.     Comments: No stiffness or rigidity.  Cardiovascular:     Rate and Rhythm: Normal rate and regular rhythm.     Pulses: Normal pulses.     Heart sounds: Normal heart sounds. No murmur heard.    No friction rub. No gallop.  Pulmonary:     Effort: Pulmonary effort is normal. No accessory muscle usage or respiratory distress.     Breath sounds: Normal breath sounds.  Abdominal:     General: Bowel sounds are normal. There is no distension.     Palpations: Abdomen is soft.     Tenderness: There is abdominal tenderness.  Genitourinary:    Comments: No cva tenderness. Musculoskeletal:        General: No swelling or tenderness.     Cervical back: Normal range of motion and neck supple. No rigidity.     Right lower leg: No edema.     Left lower leg: No edema.  Lymphadenopathy:     Cervical: No cervical adenopathy.  Skin:    General: Skin is warm and dry.     Findings: No rash.  Neurological:     Mental Status: He is alert.     Comments: Alert, speech clear.   Psychiatric:        Mood and Affect: Mood normal.     ED Results / Procedures / Treatments   Labs (all labs ordered are listed, but only abnormal results are displayed) Results for orders placed or performed during the hospital encounter of 11/26/21  Resp Panel by RT-PCR  (Flu A&B, Covid) Anterior Nasal Swab   Specimen: Anterior Nasal Swab  Result Value Ref Range   SARS Coronavirus 2 by RT PCR NEGATIVE NEGATIVE   Influenza A by PCR NEGATIVE NEGATIVE   Influenza B by PCR NEGATIVE NEGATIVE  Blood culture (routine x 2)   Specimen: Left Antecubital; Blood  Result Value Ref Range   Specimen  Description LEFT ANTECUBITAL    Special Requests      BOTTLES DRAWN AEROBIC AND ANAEROBIC Blood Culture adequate volume Performed at [facility], [address], [place], [place] [address]    Culture PENDING    Report Status PENDING   Blood culture (routine x 2)   Specimen: BLOOD RIGHT HAND  Result Value Ref Range   Specimen Description BLOOD RIGHT HAND    Special Requests      BOTTLES DRAWN AEROBIC AND ANAEROBIC Blood Culture adequate volume Performed at [facility], [address] [place], [place] [address]    Culture PENDING    Report Status PENDING   Comprehensive metabolic panel  Result Value Ref Range   Sodium 142 135 - 145 mmol/L   Potassium 4.0 3.5 - 5.1 mmol/L   Chloride 105 98 - 111 mmol/L   CO2 30 22 - 32 mmol/L   Glucose, Bld 160 (H) 70 - 99 mg/dL   BUN 10 8 - 23 mg/dL   Creatinine, Ser 1.07 0.61 - 1.24 mg/dL   Calcium 9.0 8.9 - 10.3 mg/dL   Total Protein 6.6 6.5 - 8.1 g/dL   Albumin 3.5 3.5 - 5.0 g/dL   AST 22 15 - 41 U/L   ALT 34 0 - 44 U/L   Alkaline Phosphatase 72 38 - 126 U/L   Total Bilirubin 0.5 0.3 - 1.2 mg/dL   GFR, Estimated >60 >60 mL/min   Anion gap 7 5 - 15  CBC  Result Value Ref Range   WBC 8.4 4.0 - 10.5 K/uL   RBC 4.19 (L) 4.22 - 5.81 MIL/uL   Hemoglobin 12.8 (L) 13.0 - 17.0 g/dL   HCT 40.2 39.0 - 52.0 %   MCV 95.9 80.0 - 100.0 fL   MCH 30.5 26.0 - 34.0 pg   MCHC 31.8 30.0 - 36.0 g/dL   RDW 13.6 11.5 - 15.5 %   Platelets 181 150 - 400 K/uL   nRBC 0.0 0.0 - 0.2 %  Lipase, blood  Result Value Ref Range   Lipase 26 11 - 51 U/L  Lactic acid, plasma  Result Value Ref Range   Lactic Acid, Venous 1.0 0.5 - 1.9 mmol/L    DG Chest Port 1 View  Result Date: 11/26/2021 CLINICAL DATA:  Cough. EXAM: PORTABLE CHEST 1 VIEW COMPARISON:  Radiograph 11/23/2021 FINDINGS: Improved lung volumes and aeration from prior exam. Stable heart size and mediastinal contours. No new or developing airspace disease. No pulmonary edema, pleural effusion or pneumothorax. Stable osseous structures. IMPRESSION: Improved lung volumes and aeration from prior exam. No acute findings. Electronically Signed   By: Keith Rake M.D.   On: 11/26/2021 22:58   DG Chest 2 View  Result Date: 11/23/2021 CLINICAL DATA:  Acute cough and bronchospasm, nonresolving cough EXAM: CHEST - 2 VIEW COMPARISON:  08/12/2020 FINDINGS: Examination limited secondary to body habitus. Borderline enlargement of cardiac silhouette. Mediastinal contours and pulmonary vascularity normal. Mild LEFT basilar atelectasis. Lungs otherwise clear. No infiltrate, pleural effusion or pneumothorax. IMPRESSION: Decreased lung volumes with LEFT basilar atelectasis. Electronically Signed   By: Lavonia Dana M.D.   On: 11/23/2021 17:56    EKG None  Radiology DG Chest Port 1 View  Result Date: 11/26/2021 CLINICAL DATA:  Cough. EXAM: PORTABLE CHEST 1 VIEW COMPARISON:  Radiograph 11/23/2021 FINDINGS: Improved lung volumes and aeration from prior exam. Stable heart size and mediastinal contours. No new or developing airspace disease. No pulmonary edema, pleural effusion or pneumothorax. Stable osseous structures. IMPRESSION:  Improved lung volumes and aeration from prior exam. No acute findings. Electronically Signed   By: Keith Rake M.D.   On: 11/26/2021 22:58    Procedures Procedures    Medications Ordered in ED Medications - No data to display  ED Course/ Medical Decision Making/ A&P                           Medical Decision Making Problems Addressed: Abdominal pain, generalized: acute illness or injury with systemic symptoms that poses a threat to life or bodily  functions Nausea and vomiting in adult: acute illness or injury with systemic symptoms Viral URI with cough: acute illness or injury with systemic symptoms  Amount and/or Complexity of Data Reviewed Independent Historian: spouse    Details: hx External Data Reviewed: notes. Labs: ordered. Decision-making details documented in ED Course. Radiology: ordered and independent interpretation performed. Decision-making details documented in ED Course.  Risk OTC drugs. Decision regarding hospitalization.   Iv ns. Continuous pulse ox and cardiac monitoring. Labs ordered/sent. Imaging ordered.   Diff dx includes pna, uri/viral syndrome, gi illness, etc. - dispo decision including potential need for admission considered - will get labs and imaging and reassess.   Reviewed nursing notes and prior charts for additional history. External reports reviewed. Additional history from: spouse, hx  Cardiac monitor: sinus rhythm, rate 64.  Labs reviewed/interpreted by me - wbc normal.  Xrays reviewed/interpreted by me - no pna.   Persistent nausea and abd tenderness. Will get ct.   Additional labs pending. Ct pending.   2335, signed out to Dr Christy Gentles to check pending labs and ct, recheck pt, and dispo appropriately.          Final Clinical Impression(s) / ED Diagnoses Final diagnoses:  None    Rx / DC Orders ED Discharge Orders     None            Lajean Saver, MD 11/26/21 2352

## 2021-11-26 NOTE — Discharge Instructions (Addendum)
It was our pleasure to provide your ER care today - we hope that you feel better.  Drink plenty of fluids/stay well hydrated.  Try mucinex or nyquil as need for symptom relief.  Follow up with primary care doctor in the coming week if symptoms fail to improve/resolve.  Return to ER if worse, new symptoms, increased trouble breathing, new/severe pain, chest pain, persistent vomiting, or other concern.

## 2021-11-27 ENCOUNTER — Emergency Department (HOSPITAL_COMMUNITY): Payer: Medicare Other

## 2021-11-27 DIAGNOSIS — J069 Acute upper respiratory infection, unspecified: Secondary | ICD-10-CM | POA: Diagnosis not present

## 2021-11-27 MED ORDER — IOHEXOL 300 MG/ML  SOLN
100.0000 mL | Freq: Once | INTRAMUSCULAR | Status: AC | PRN
  Administered 2021-11-27: 100 mL via INTRAVENOUS

## 2021-11-27 MED ORDER — ONDANSETRON 4 MG PO TBDP: 4.0000 mg | ORAL_TABLET | Freq: Three times a day (TID) | ORAL | 0 refills | Status: DC | PRN

## 2021-11-27 NOTE — ED Provider Notes (Signed)
I assumed care at signout.  CT imaging is negative.  Patient feels improved. He will be discharged home.  Wife requests Zofran at discharge   Ripley Fraise, MD 11/27/21 [phone]

## 2021-12-01 LAB — CULTURE, BLOOD (ROUTINE X 2)
Culture: NO GROWTH
Culture: NO GROWTH
Special Requests: ADEQUATE
Special Requests: ADEQUATE

## 2021-12-09 ENCOUNTER — Other Ambulatory Visit: Payer: Self-pay | Admitting: Physical Medicine & Rehabilitation

## 2021-12-16 ENCOUNTER — Encounter: Payer: Self-pay | Admitting: Physical Medicine & Rehabilitation

## 2021-12-16 ENCOUNTER — Encounter: Payer: Medicare Other | Attending: Physical Medicine & Rehabilitation | Admitting: Physical Medicine & Rehabilitation

## 2021-12-16 VITALS — BP 116/80 | HR 95 | Ht 73.0 in

## 2021-12-16 DIAGNOSIS — G8250 Quadriplegia, unspecified: Secondary | ICD-10-CM | POA: Diagnosis present

## 2021-12-16 DIAGNOSIS — D496 Neoplasm of unspecified behavior of brain: Secondary | ICD-10-CM | POA: Diagnosis present

## 2021-12-16 NOTE — Progress Notes (Signed)
Subjective:    Patient ID: Anthony Downs, male    DOB: 1950/01/25, 71 y.o.   MRN: [id number]  HPI Anthony Downs is here in follow up of his tetraplegia. He was sick with bronchitis for about 3 weeks leading up to this visit today. His wife is sick now with pneumonia.   Up until he got sick he had been working in therapy up until September as it appears he's plateaued a bit which wasn't helped by his bronchitis. He can pull up to the stedy but struggles to take any steps.   They have changed the focus at home to work on transfers and standing as opposed to ambulation. However, it's a struggle for his wife to do that kind of work with him. It's even hard for caregivers at home.   Stopped sinemet due to fatigue. Concern that propranolol might be exacerbating current asthmatic bronchitis  Pain Inventory Average Pain 0 Pain Right Now 0 My pain is  no pain  LOCATION OF PAIN  No pain  BOWEL Number of stools per week: 5 Oral laxative use Yes  Type of laxative Metamucil   BLADDER Normal    Mobility ability to climb steps?  no do you drive?  no use a wheelchair needs help with transfers Do you have any goals in this area?  yes  Function retired I need assistance with the following:  dressing, bathing, toileting, meal prep, household duties, and shopping Do you have any goals in this area?  yes  Neuro/Psych tremor trouble walking  Prior Studies Any changes since last visit?  yes x-rays chest  Physicians involved in your care Any changes since last visit?  yes   Family History  Problem Relation Age of Onset   Breast cancer Mother    Pancreatic cancer Mother    Aortic aneurysm Father    Social History   Socioeconomic History   Marital status: Married    Spouse name: Not on file   Number of children: 2   Years of education: 12   Highest education level: High school graduate  Occupational History   Occupation: Retired  Tobacco Use   Smoking status:  Never   Smokeless tobacco: Never  Vaping Use   Vaping Use: Never used  Substance and Sexual Activity   Alcohol use: No    Comment: no use since 2008   Drug use: No   Sexual activity: Yes  Other Topics Concern   Not on file  Social History Narrative   Lives with wife.   Right-handed.   Two cans Dr. Malachi Bonds daily.   Social Determinants of Health   Financial Resource Strain: Not on file  Food Insecurity: Not on file  Transportation Needs: Not on file  Physical Activity: Not on file  Stress: Not on file  Social Connections: Not on file   Past Surgical History:  Procedure Laterality Date   APPLICATION OF CRANIAL NAVIGATION Left 04/29/2020   Procedure: [place];  Surgeon: Judith Part, MD;  Location: [place];  Service: Neurosurgery;  Laterality: Left;   arthroscopic knee     COLONOSCOPY N/A 11/29/2018   Procedure: COLONOSCOPY;  Surgeon: Rogene Houston, MD;  [facility];  Service: Endoscopy;  Laterality: N/A;  730-rescheduled 9/2 same time per Ann   CRANIOTOMY Left 04/29/2020   Procedure: LEFT CRANIECTOMY WITH TUMOR EXCISION;  Surgeon: Judith Part, MD;  Location: [place];  Service: Neurosurgery;  Laterality: Left;   KNEE SURGERY  Right    NASAL SEPTOPLASTY W/ TURBINOPLASTY Bilateral 03/19/2020   Procedure: NASAL SEPTOPLASTY WITH BILATERAL  TURBINATE REDUCTION;  Surgeon: Leta Baptist, MD;  Location: [place];  Service: ENT;  Laterality: Bilateral;   VASECTOMY     Past Medical History:  Diagnosis Date   Acid reflux    Anxiety    Arthritis    Asthma    as a child   Basal cell carcinoma 01/26/2021   RIGHT POST SURICULAR INFERIOR   Basal cell carcinoma 01/26/2021   RIGHT POST AURICULAR SUPERIOR   Benign brain tumor ([place])    Cancer ([place])    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, squamous behind ear   Complication of anesthesia    slow to wake up and pain medicines make him sick   GERD (gastroesophageal reflux disease)     Hypercholesteremia    Hypertension    PONV (postoperative nausea and vomiting)    Sleep apnea    no Cpap   Subdural hematoma, post-traumatic ([place]) 2008   fell off truck hit head on concrete   BP 116/80   Pulse 95   Ht '6\' 1"'$  (1.854 m)   BMI 34.70 kg/m   Opioid Risk Score:   Fall Risk Score:  `1  Depression screen PHQ 2/9     12/16/2021    2:12 PM 09/09/2021    2:43 PM 03/04/2021    2:35 PM 11/26/2020    2:00 PM 09/17/2020    2:39 PM 09/17/2020    1:18 PM  Depression screen PHQ 2/9  Decreased Interest 0 0 0 0 3 1  Down, Depressed, Hopeless 0 0 0 0 2 1  PHQ - 2 Score 0 0 0 0 5 2  Altered sleeping     3   Tired, decreased energy     3   Change in appetite     3   Feeling bad or failure about yourself      0   Trouble concentrating     3   Moving slowly or fidgety/restless     3   Suicidal thoughts     0   PHQ-9 Score     20     Review of Systems  Musculoskeletal:  Positive for gait problem.  Neurological:  Positive for tremors.  All other systems reviewed and are negative.      Objective:   Physical Exam  General: No acute distress, obese HEENT: NCAT, EOMI, oral membranes moist Cards: reg rate  Chest: normal effort Abdomen: Soft, NT, ND Skin: dry, intact Extremities: no edema Psych: pleasant and appropriate  Skin: intact Neuro: alert and oriented. Very attentive and engaging. Improved insight and awareness. Engages much more cognitively. Intentional tremor R>L UE's. Low frequency.  Resting tone 1/4 RUE and 1/4 with tight heel cord improved.  Upper extremity strength grossly  4 out of 5 on left, RUE 3/5. LLE 3-/5. RLE 1 to 1+/5        Reflexes are still 3+. Intentional tremor in UE R>L still present Musculoskeletal:  good sitting posture         Assessment & Plan:  1. 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  2.  Left posterior tibial vein DVT:  Xarelto             -will need to continue until he's walking 3.  Sleep disorder             -continue trazodone '50mg'$  qhs 4.  Neurogenic bowel:             - suppository or dig stim on toilet if needed 5.  Urinary frequency with incontinence: frequent UTI's            -per urology            -flomax             -he's now continent! 6.Seizures: depakote 500 mg qd             - will come off of in February 7.Tremor: sinemet trial failed. Hold propranolol for now for asthmatic bronchitis 8. Swelling:              -TEDS vs zip up compressing stockings vs velcro compression stockings were discussed             -otherwise keep feet elevated when possible.    20 minutes of face to face patient care time were spent during this visit. All questions were encouraged and answered. Follow up with me in 3 mos.

## 2021-12-16 NOTE — Patient Instructions (Signed)
ALWAYS FEEL FREE TO CALL OUR OFFICE WITH ANY PROBLEMS OR QUESTIONS ([phone])  **PLEASE NOTE** ALL MEDICATION REFILL REQUESTS (INCLUDING CONTROLLED SUBSTANCES) NEED TO BE MADE AT LEAST 7 DAYS PRIOR TO REFILL BEING DUE. ANY REFILL REQUESTS INSIDE THAT TIME FRAME MAY RESULT IN DELAYS IN RECEIVING YOUR PRESCRIPTION.                    

## 2021-12-29 ENCOUNTER — Emergency Department (HOSPITAL_COMMUNITY)
Admission: EM | Admit: 2021-12-29 | Discharge: 2021-12-29 | Disposition: A | Discharge status: Home / Self Care | Payer: Medicare Other | Attending: Emergency Medicine | Admitting: Emergency Medicine

## 2021-12-29 ENCOUNTER — Encounter (HOSPITAL_COMMUNITY): Payer: Self-pay | Admitting: *Deleted

## 2021-12-29 ENCOUNTER — Other Ambulatory Visit: Payer: Self-pay

## 2021-12-29 ENCOUNTER — Emergency Department (HOSPITAL_COMMUNITY): Payer: Medicare Other

## 2021-12-29 DIAGNOSIS — R0602 Shortness of breath: Secondary | ICD-10-CM | POA: Insufficient documentation

## 2021-12-29 DIAGNOSIS — R0600 Dyspnea, unspecified: Secondary | ICD-10-CM | POA: Insufficient documentation

## 2021-12-29 DIAGNOSIS — Z1152 Encounter for screening for COVID-19: Secondary | ICD-10-CM | POA: Insufficient documentation

## 2021-12-29 DIAGNOSIS — B974 Respiratory syncytial virus as the cause of diseases classified elsewhere: Secondary | ICD-10-CM | POA: Diagnosis not present

## 2021-12-29 DIAGNOSIS — J210 Acute bronchiolitis due to respiratory syncytial virus: Secondary | ICD-10-CM

## 2021-12-29 LAB — COMPREHENSIVE METABOLIC PANEL
ALT: 39 U/L (ref 0–44)
AST: 28 U/L (ref 15–41)
Albumin: 3.3 g/dL — ABNORMAL LOW (ref 3.5–5.0)
Alkaline Phosphatase: 88 U/L (ref 38–126)
Anion gap: 10 (ref 5–15)
BUN: 11 mg/dL (ref 8–23)
CO2: 26 mmol/L (ref 22–32)
Calcium: 8.7 mg/dL — ABNORMAL LOW (ref 8.9–10.3)
Chloride: 103 mmol/L (ref 98–111)
Creatinine, Ser: 1.2 mg/dL (ref 0.61–1.24)
GFR, Estimated: >60 mL/min (ref >60)
Glucose, Bld: 147 mg/dL — ABNORMAL HIGH (ref 70–99)
Potassium: 4.2 mmol/L (ref 3.5–5.1)
Sodium: 139 mmol/L (ref 135–145)
Total Bilirubin: 0.4 mg/dL (ref 0.3–1.2)
Total Protein: 6.4 g/dL — ABNORMAL LOW (ref 6.5–8.1)

## 2021-12-29 LAB — CBC
HCT: 41.5 % (ref 39.0–52.0)
Hemoglobin: 13.1 g/dL (ref 13.0–17.0)
MCH: 30 pg (ref 26.0–34.0)
MCHC: 31.6 g/dL (ref 30.0–36.0)
MCV: 95.2 fL (ref 80.0–100.0)
Platelets: 165 10*3/uL (ref 150–400)
RBC: 4.36 MIL/uL (ref 4.22–5.81)
RDW: 14 % (ref 11.5–15.5)
WBC: 4.8 10*3/uL (ref 4.0–10.5)
nRBC: 0 % (ref 0.0–0.2)

## 2021-12-29 LAB — RESP PANEL BY RT-PCR (RSV, FLU A&B, COVID)  RVPGX2
Influenza A by PCR: NEGATIVE
Influenza B by PCR: NEGATIVE
Resp Syncytial Virus by PCR: POSITIVE — AB
SARS Coronavirus 2 by RT PCR: NEGATIVE

## 2021-12-29 LAB — BRAIN NATRIURETIC PEPTIDE: B Natriuretic Peptide: 51 pg/mL (ref 0.0–100.0)

## 2021-12-29 MED ORDER — PREDNISONE 50 MG PO TABS
60.0000 mg | ORAL_TABLET | ORAL | Status: AC
  Administered 2021-12-29: 60 mg via ORAL
  Filled 2021-12-29: qty 1

## 2021-12-29 MED ORDER — PREDNISONE 20 MG PO TABS: 40.0000 mg | ORAL_TABLET | Freq: Every day | ORAL | 0 refills | Status: DC

## 2021-12-29 NOTE — Progress Notes (Signed)
Patient performed Peak Flow. Best of 3 attempts noted, patient performed 150

## 2021-12-29 NOTE — ED Triage Notes (Signed)
Pt brought in by [facility] for c/o sob x 6 weeks; pt states he has been to his PCP x 2 and given medications with no relief

## 2021-12-29 NOTE — ED Provider Notes (Signed)
[facility] EMERGENCY DEPARTMENT Provider Note   CSN: [id number] Arrival date & time: 12/29/21  1009     History  Chief Complaint  Patient presents with   Shortness of Breath    Anthony Downs is a 71 y.o. male.  HPI Patient presents dyspnea.  He arrives via EMS, history is obtained by those individuals and the patient himself.  He notes that he has had dyspnea for some time, worse over the past days.  EMS reports the patient was hypoxic, 85% on room air, improved to 97% with 3 L via nasal cannula.  Patient denies focal pain, syncope, fever.    Home Medications Prior to Admission medications   Medication Sig Start Date End Date Taking? Authorizing Provider  albuterol (VENTOLIN HFA) 108 (90 Base) MCG/ACT inhaler Inhale 2 puffs into the lungs every 4 (four) hours. 11/12/21  Yes [provider]  atorvastatin (LIPITOR) 10 MG tablet Take 1 tablet (10 mg total) by mouth at bedtime. 06/17/20  Yes Love, Ivan Anchors, PA-C  carbidopa-levodopa (SINEMET IR) 10-100 MG tablet TAKE 1 TABLET BY MOUTH TWICE A DAY 12/09/21  Yes [name], MD  cetirizine (ZYRTEC) 10 MG tablet Take 10 mg by mouth daily.   Yes [provider]  cyanocobalamin (VITAMIN B12) 1000 MCG tablet Take 1,000 mcg by mouth daily.   Yes [provider]  divalproex (DEPAKOTE) 500 MG DR tablet Take 1 tablet (500 mg total) by mouth 2 (two) times daily. Patient taking differently: Take 500 mg by mouth once. One tablet at night 02/26/21 12/29/21 Yes Camara, Amadou, MD  finasteride (PROSCAR) 5 MG tablet Take 1 tablet (5 mg total) by mouth daily. 11/04/21  Yes Festus Aloe, MD  furosemide (LASIX) 20 MG tablet Take 1 tablet (20 mg total) by mouth daily. 06/17/20  Yes Love, Ivan Anchors, PA-C  levofloxacin (LEVAQUIN) 500 MG tablet Take 500 mg by mouth daily. 12/18/21  Yes [provider]  OZEMPIC, 0.25 OR 0.5 MG/DOSE, 2 MG/3ML SOPN Inject 0.5 mg into the skin once a week. 12/02/21  Yes [provider]  pantoprazole (PROTONIX) 40 MG tablet Take 1 tablet (40 mg total) by mouth daily. 06/17/20  Yes Love, Ivan Anchors, PA-C  potassium chloride SA (KLOR-CON) 20 MEQ tablet Take 1 tablet (20 mEq total) by mouth daily. 06/17/20  Yes Love, Ivan Anchors, PA-C  predniSONE (DELTASONE) 20 MG tablet Take 2 tablets (40 mg total) by mouth daily with breakfast. For the next four days 12/29/21  Yes Carmin Muskrat, MD  propranolol (INDERAL) 20 MG tablet Take 1 tablet (20 mg total) by mouth 3 (three) times daily. 06/17/20  Yes Love, Ivan Anchors, PA-C  Propylene Glycol-Glycerin (SOOTHE OP) Apply 1 drop to eye daily as needed (dry eyes).   Yes [provider]  rivaroxaban (XARELTO) 20 MG TABS tablet Take 1 tablet (20 mg total) by mouth daily with supper. 06/17/20  Yes Love, Ivan Anchors, PA-C  tamsulosin (FLOMAX) 0.4 MG CAPS capsule TAKE 2 CAPSULES BY MOUTH AT BEDTIME. 09/29/20  Yes [name], MD  thiamine 100 MG tablet Take 1 tablet (100 mg total) by mouth daily. 07/16/20  Yes Annita Brod, MD  zinc gluconate 50 MG tablet Take 50 mg by mouth daily.   Yes [provider]  ondansetron (ZOFRAN-ODT) 4 MG disintegrating tablet Take 1 tablet (4 mg total) by mouth every 8 (eight) hours as needed. Patient not taking: Reported on 12/29/2021 11/27/21   Ripley Fraise, MD  Allergies    Ativan [lorazepam] and Haldol [haloperidol]    Review of Systems   Review of Systems  All other systems reviewed and are negative.   Physical Exam Updated Vital Signs BP 131/75   Pulse 70   Temp 98.2 F (36.8 C) (Oral)   Resp 12   Ht '6\' 1"'$  (1.854 m)   Wt 131.5 kg   SpO2 95%   BMI 38.26 kg/m  Physical Exam Vitals and nursing note reviewed.  Constitutional:      General: He is not in acute distress.    Appearance: He is well-developed. He is obese.  HENT:     Head: Normocephalic and atraumatic.  Eyes:     Conjunctiva/sclera: Conjunctivae normal.  Cardiovascular:     Rate and Rhythm:  Normal rate and regular rhythm.  Pulmonary:     Effort: Pulmonary effort is normal.     Breath sounds: Decreased breath sounds present.  Abdominal:     General: There is no distension.  Skin:    General: Skin is warm and dry.  Neurological:     Mental Status: He is alert and oriented to person, place, and time.     ED Results / Procedures / Treatments   Labs (all labs ordered are listed, but only abnormal results are displayed) Labs Reviewed  RESP PANEL BY RT-PCR (RSV, FLU A&B, COVID)  [id number] - Abnormal; Notable for the following components:      Result Value   Resp Syncytial Virus by PCR POSITIVE (*)    All other components within normal limits  COMPREHENSIVE METABOLIC PANEL - Abnormal; Notable for the following components:   Glucose, Bld 147 (*)    Calcium 8.7 (*)    Total Protein 6.4 (*)    Albumin 3.3 (*)    All other components within normal limits  CBC  BRAIN NATRIURETIC PEPTIDE    EKG Rate 66, sinus rhythm, T wave abnormalities, abnormal ECG  Radiology DG Chest Port 1 View  Result Date: 12/29/2021 CLINICAL DATA:  Cough and congestion EXAM: PORTABLE CHEST 1 VIEW COMPARISON:  None Available. FINDINGS: Normal mediastinum and cardiac silhouette. Normal pulmonary vasculature. No evidence of effusion, infiltrate, or pneumothorax. No acute bony abnormality. IMPRESSION: No acute cardiopulmonary process. Electronically Signed   By: Suzy Bouchard M.D.   On: 12/29/2021 11:00    Procedures Procedures    Medications Ordered in ED Medications  predniSONE (DELTASONE) tablet 60 mg (has no administration in time range)    ED Course/ Medical Decision Making/ A&P This patient with a Hx of obesity, as well as multiple medical problems including sleep apnea, hypertension presents to the ED for concern of dyspnea, new oxygen requirement, this involves an extensive number of treatment options, and is a complaint that carries with it a high risk of complications and morbidity.     The differential diagnosis includes pneumonia, heart failure, bacteremia, sepsis, hepatobiliary dysfunction, PE (however, patient is on Xarelto)   Social Determinants of Health:  Obesity advanced age  Additional history obtained:  Additional history and/or information obtained from EMS, notable for details above   After the initial evaluation, orders, including: Labs supplemental oxygen x-ray EKG were initiated.   Patient placed on Cardiac and Pulse-Oximetry Monitors. The patient was maintained on a cardiac monitor.  The cardiac monitored showed an rhythm of 75 sinus normal The patient was also maintained on pulse oximetry. The readings were typically 99% 3 L nasal cannula abnormal   On repeat evaluation of the patient  improved 3:20 PM Patient awaken from sleeping.  Pulse ox 95% room air Lab Tests:  I personally interpreted labs.  The pertinent results include: RSV positive  Imaging Studies ordered:  I independently visualized and interpreted imaging which showed no pneumonia I agree with the radiologist interpretation   Dispostion / Final MDM:  After consideration of the diagnostic results and the patient's response to treatment, adult male presents with dyspnea, fatigue is found to have RSV.  He has no oxygen requirement, he is sleeping after initial interventions including bronchodilators, has no evidence for bacteremia, sepsis, ACS.  Hospitalization consideration given his initial dyspnea, but given his improvement he is discharged in stable condition to follow-up with primary care.  Final Clinical Impression(s) / ED Diagnoses Final diagnoses:  RSV (acute bronchiolitis due to respiratory syncytial virus)    Rx / DC Orders ED Discharge Orders          Ordered    predniSONE (DELTASONE) 20 MG tablet  Daily with breakfast        12/29/21 1519              Carmin Muskrat, MD 12/29/21 1520

## 2021-12-29 NOTE — Discharge Instructions (Signed)
Please use your albuterol every 4 hours for the next 2 days, then as needed.  Please obtain and take your steroids.  Be sure to follow-up with your physician or return here for concerning changes in your condition.

## 2021-12-29 NOTE — ED Triage Notes (Signed)
"  Shortness of breath x 6 weeks, seen by doctors and had medications changed, but not getting better" per pt

## 2022-01-26 ENCOUNTER — Other Ambulatory Visit (HOSPITAL_COMMUNITY): Payer: Self-pay | Admitting: Internal Medicine

## 2022-01-26 DIAGNOSIS — R0781 Pleurodynia: Secondary | ICD-10-CM

## 2022-01-27 ENCOUNTER — Ambulatory Visit (HOSPITAL_COMMUNITY)
Admission: RE | Admit: 2022-01-27 | Discharge: 2022-01-27 | Disposition: A | Discharge status: Home / Self Care | Payer: Medicare Other | Source: Ambulatory Visit | Attending: Internal Medicine | Admitting: Internal Medicine

## 2022-01-27 DIAGNOSIS — R0781 Pleurodynia: Secondary | ICD-10-CM | POA: Insufficient documentation

## 2022-01-28 ENCOUNTER — Telehealth: Payer: Self-pay | Admitting: Orthopedic Surgery

## 2022-01-28 DIAGNOSIS — M19019 Primary osteoarthritis, unspecified shoulder: Secondary | ICD-10-CM

## 2022-01-28 NOTE — Telephone Encounter (Signed)
Patient's spouse called and stated that she needs to get Dr. Amedeo Kinsman to refer him again for an injection in his right shoulder.  Pt's # [phone]

## 2022-01-29 NOTE — Telephone Encounter (Signed)
OK to order 

## 2022-01-29 NOTE — Telephone Encounter (Signed)
Called and spoke with pt wife. Due to pt medical needs injection ordered at [facility], they will call to schedule.

## 2022-02-01 ENCOUNTER — Encounter: Payer: Self-pay | Admitting: Urology

## 2022-02-01 ENCOUNTER — Ambulatory Visit (INDEPENDENT_AMBULATORY_CARE_PROVIDER_SITE_OTHER): Payer: Medicare Other | Admitting: Urology

## 2022-02-01 VITALS — BP 125/79 | HR 71 | Ht 73.0 in | Wt 290.0 lb

## 2022-02-01 DIAGNOSIS — N401 Enlarged prostate with lower urinary tract symptoms: Secondary | ICD-10-CM

## 2022-02-01 DIAGNOSIS — N138 Other obstructive and reflux uropathy: Secondary | ICD-10-CM | POA: Diagnosis not present

## 2022-02-01 MED ORDER — TAMSULOSIN HCL 0.4 MG PO CAPS: 0.8000 mg | ORAL_CAPSULE | Freq: Every day | ORAL | 3 refills | Status: AC

## 2022-02-01 MED ORDER — FINASTERIDE 5 MG PO TABS: 5.0000 mg | ORAL_TABLET | Freq: Every day | ORAL | 3 refills | Status: DC

## 2022-02-01 NOTE — Progress Notes (Signed)
02/01/2022 2:28 PM   Anthony Downs [birth year] [id number]  Referring provider: Redmond School, MD [address] [place],  [place] [address]  No chief complaint on file.   HPI:  F/u -    1) BPH, lower urinary tract symptoms-patient with frequency, urgency, intermittent stream.  He has had several cultures positive for E. coli. He voids in a diaper. Gets urine odor at times. He takes tamsulosin 0.8 mg po QHS. Finasteride added Oct 2022. 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 Right sided deficits and now in a motorized chair. Doing PT. H/o seizures.  He has some constipation and takes stool softeners and laxatives. He drinks Dr. Mamie Nick every day. No prior GU hx or h/o BPH.    Bladder scan was 242 mL. He can't stand for DRE. UA clear. PVR 17 ml. AUASS = 14. Better voiding on tams and 5ari.  PSA was 1.2 (2.4) in Dec 2022.   He was seen Jul 2023 and had quite using diapers. Voids in a urinal. Getting extensive PT. Now ambulating with assistance.    Today, seen for the above. He had emesis and abd pain Nov 2023. CT benign from GU pt of view. No LAD or bone lesions. Using a urinal. PVR 102. No dysuria or gross hematuria.    His wife had a lumpectomy and XRT in 2023 for breast ca.   PMH: Past Medical History:  Diagnosis Date   Acid reflux    Anxiety    Arthritis    Asthma    as a child   Basal cell carcinoma 01/26/2021   RIGHT POST SURICULAR INFERIOR   Basal cell carcinoma 01/26/2021   RIGHT POST AURICULAR SUPERIOR   Benign brain tumor ([place])    Cancer ([place])    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, squamous behind ear   Complication of anesthesia    slow to wake up and pain medicines make him sick   GERD (gastroesophageal reflux disease)    Hypercholesteremia    Hypertension    PONV (postoperative nausea and vomiting)    Sleep apnea    no Cpap   Subdural hematoma, post-traumatic ([place]) 2008   fell off truck hit head on concrete     Surgical History: Past Surgical History:  Procedure Laterality Date   APPLICATION OF CRANIAL NAVIGATION Left 04/29/2020   Procedure: [place];  Surgeon: Judith Part, MD;  Location: [facility];  Service: Neurosurgery;  Laterality: Left;   arthroscopic knee     COLONOSCOPY N/A 11/29/2018   Procedure: COLONOSCOPY;  Surgeon: Rogene Houston, MD;  Location: [facility];  Service: Endoscopy;  Laterality: N/A;  730-rescheduled 9/2 same time per Ann   CRANIOTOMY Left 04/29/2020   Procedure: LEFT CRANIECTOMY WITH TUMOR EXCISION;  Surgeon: Judith Part, MD;  Location: [facility];  Service: Neurosurgery;  Laterality: Left;   KNEE SURGERY Right    NASAL SEPTOPLASTY W/ TURBINOPLASTY Bilateral 03/19/2020   Procedure: NASAL SEPTOPLASTY WITH BILATERAL  TURBINATE REDUCTION;  Surgeon: Leta Baptist, MD;  Location: [facility];  Service: ENT;  Laterality: Bilateral;   VASECTOMY      Home Medications:  Allergies as of 02/01/2022       Reactions   Ativan [lorazepam]    Worsening confusion   Haldol [haloperidol]    Worsening confusion        Medication List        Accurate as of February 01, 2022  2:28  PM. If you have any questions, ask your nurse or doctor.          albuterol 108 (90 Base) MCG/ACT inhaler Commonly known as: VENTOLIN HFA Inhale 2 puffs into the lungs every 4 (four) hours.   atorvastatin 10 MG tablet Commonly known as: LIPITOR Take 1 tablet (10 mg total) by mouth at bedtime.   carbidopa-levodopa 10-100 MG tablet Commonly known as: SINEMET IR TAKE 1 TABLET BY MOUTH TWICE A DAY   cetirizine 10 MG tablet Commonly known as: ZYRTEC Take 10 mg by mouth daily.   cyanocobalamin 1000 MCG tablet Commonly known as: VITAMIN B12 Take 1,000 mcg by mouth daily.   divalproex 500 MG DR tablet Commonly known as: Depakote Take 1 tablet (500 mg total) by mouth 2 (two) times daily. What changed:  when to take this additional instructions   finasteride 5  MG tablet Commonly known as: PROSCAR Take 1 tablet (5 mg total) by mouth daily.   furosemide 20 MG tablet Commonly known as: LASIX Take 1 tablet (20 mg total) by mouth daily.   levofloxacin 500 MG tablet Commonly known as: LEVAQUIN Take 500 mg by mouth daily.   ondansetron 4 MG disintegrating tablet Commonly known as: ZOFRAN-ODT Take 1 tablet (4 mg total) by mouth every 8 (eight) hours as needed.   Ozempic (0.25 or 0.5 MG/DOSE) 2 MG/3ML Sopn Generic drug: Semaglutide(0.25 or 0.'5MG'$ /DOS) Inject 0.5 mg into the skin once a week.   pantoprazole 40 MG tablet Commonly known as: PROTONIX Take 1 tablet (40 mg total) by mouth daily.   potassium chloride SA 20 MEQ tablet Commonly known as: KLOR-CON M Take 1 tablet (20 mEq total) by mouth daily.   predniSONE 20 MG tablet Commonly known as: DELTASONE Take 2 tablets (40 mg total) by mouth daily with breakfast. For the next four days   propranolol 20 MG tablet Commonly known as: INDERAL Take 1 tablet (20 mg total) by mouth 3 (three) times daily.   rivaroxaban 20 MG Tabs tablet Commonly known as: XARELTO Take 1 tablet (20 mg total) by mouth daily with supper.   SOOTHE OP Apply 1 drop to eye daily as needed (dry eyes).   tamsulosin 0.4 MG Caps capsule Commonly known as: FLOMAX TAKE 2 CAPSULES BY MOUTH AT BEDTIME.   thiamine 100 MG tablet Commonly known as: VITAMIN B1 Take 1 tablet (100 mg total) by mouth daily.   zinc gluconate 50 MG tablet Take 50 mg by mouth daily.        Allergies:  Allergies  Allergen Reactions   Ativan [Lorazepam]     Worsening confusion   Haldol [Haloperidol]     Worsening confusion    Family History: Family History  Problem Relation Age of Onset   Breast cancer Mother    Pancreatic cancer Mother    Aortic aneurysm Father     Social History:  reports that he has never smoked. He has never used smokeless tobacco. He reports that he does not drink alcohol and does not use  drugs.   Physical Exam: BP 125/79   Pulse 71   Ht '6\' 1"'$  (1.854 m)   Wt 290 lb (131.5 kg)   BMI 38.26 kg/m   Constitutional:  Alert and oriented, No acute distress. In wheelchair.  HEENT: [place] AT, moist mucus membranes.  Trachea midline, no masses. Cardiovascular: No clubbing, cyanosis, or edema. Respiratory: Normal respiratory effort, no increased work of breathing. GI: Abdomen is soft, nontender, nondistended, no abdominal masses GU: No  CVA tenderness Skin: No rashes, bruises or suspicious lesions. Neurologic: Grossly intact, no focal deficits, moving all 4 extremities. Psychiatric: Normal mood and affect.  Laboratory Data: Lab Results  Component Value Date   WBC 4.8 12/29/2021   HGB 13.1 12/29/2021   HCT 41.5 12/29/2021   MCV 95.2 12/29/2021   PLT 165 12/29/2021    Lab Results  Component Value Date   CREATININE 1.20 12/29/2021    No results found for: "PSA"  No results found for: "TESTOSTERONE"  Lab Results  Component Value Date   HGBA1C 6.0 (H) 07/07/2020    Urinalysis    Component Value Date/Time   COLORURINE YELLOW 08/13/2020 1150   APPEARANCEUR Clear 08/03/2021 1522   LABSPEC 1.009 08/13/2020 1150   PHURINE 6.0 08/13/2020 1150   GLUCOSEU Negative 08/03/2021 1522   HGBUR NEGATIVE 08/13/2020 1150   BILIRUBINUR Negative 08/03/2021 1522   KETONESUR small (15) (A) 09/03/2020 1523   KETONESUR NEGATIVE 08/13/2020 1150   PROTEINUR Negative 08/03/2021 1522   PROTEINUR NEGATIVE 08/13/2020 1150   UROBILINOGEN 0.2 09/03/2020 1523   UROBILINOGEN 0.2 04/04/2011 2241   NITRITE Negative 08/03/2021 1522   NITRITE NEGATIVE 08/13/2020 1150   LEUKOCYTESUR Negative 08/03/2021 1522   LEUKOCYTESUR LARGE (A) 08/13/2020 1150    Lab Results  Component Value Date   LABMICR Comment 08/03/2021   BACTERIA RARE (A) 08/13/2020    Pertinent Imaging: CT 11/23   Assessment & Plan:    1. BPH with obstruction/lower urinary tract symptoms Meds refilled. Doing well. PSA  was sent. See in 1 year or sooner if issues.  - BLADDER SCAN AMB NON-IMAGING   No follow-ups on file.  Festus Aloe, MD  [facility]  [address] [place], [place] [address] [phone]

## 2022-02-01 NOTE — Progress Notes (Signed)
post void residual =102

## 2022-02-02 LAB — PSA: Prostate Specific Ag, Serum: 0.5 ng/mL (ref 0.0–4.0)

## 2022-02-08 ENCOUNTER — Ambulatory Visit
Admission: EM | Admit: 2022-02-08 | Discharge: 2022-02-08 | Discharge status: Absent without leave | Payer: Medicare Other

## 2022-02-08 ENCOUNTER — Ambulatory Visit: Payer: Medicare Other

## 2022-02-09 ENCOUNTER — Ambulatory Visit: Payer: Medicare Other

## 2022-02-19 ENCOUNTER — Telehealth: Payer: Self-pay | Admitting: Physical Medicine & Rehabilitation

## 2022-02-19 DIAGNOSIS — G8250 Quadriplegia, unspecified: Secondary | ICD-10-CM

## 2022-02-19 DIAGNOSIS — D329 Benign neoplasm of meninges, unspecified: Secondary | ICD-10-CM

## 2022-02-19 NOTE — Telephone Encounter (Signed)
Rx written for neuro rehab PT.

## 2022-02-19 NOTE — Addendum Note (Signed)
Addended by: Alger Simons T on: 02/19/2022 05:26 PM   Modules accepted: Orders

## 2022-02-19 NOTE — Telephone Encounter (Signed)
Patient's wife called requesting a referral for PT for patient, states it was mentioned before and has decided to start it

## 2022-02-23 ENCOUNTER — Ambulatory Visit: Payer: Medicare Other | Attending: Physical Medicine & Rehabilitation | Admitting: Physical Therapy

## 2022-02-23 DIAGNOSIS — D329 Benign neoplasm of meninges, unspecified: Secondary | ICD-10-CM | POA: Diagnosis present

## 2022-02-23 DIAGNOSIS — M6281 Muscle weakness (generalized): Secondary | ICD-10-CM | POA: Diagnosis present

## 2022-02-23 DIAGNOSIS — G8250 Quadriplegia, unspecified: Secondary | ICD-10-CM | POA: Insufficient documentation

## 2022-02-23 DIAGNOSIS — R2681 Unsteadiness on feet: Secondary | ICD-10-CM | POA: Insufficient documentation

## 2022-02-23 DIAGNOSIS — R293 Abnormal posture: Secondary | ICD-10-CM | POA: Insufficient documentation

## 2022-02-23 DIAGNOSIS — R2689 Other abnormalities of gait and mobility: Secondary | ICD-10-CM | POA: Diagnosis present

## 2022-02-23 NOTE — Therapy (Signed)
OUTPATIENT PHYSICAL THERAPY NEURO EVALUATION   Patient Name: Anthony Downs MRN: [id number] DOB:1950-04-06, 72 y.o., male Today's Date: 02/24/2022   PCP: Anthony School, MD REFERRING PROVIDER: [name], MD  END OF SESSION:  PT End of Session - 02/23/22 1451     Visit Number 1    Number of Visits 25   with eval   Date for PT Re-Evaluation 05/18/22    Authorization Type Medicare and [employer] (needs 10th visit progress note)    Authorization - Number of Visits 30   30 VL   Progress Note Due on Visit 10    PT Start Time 1450    PT Stop Time 1525   eval   PT Time Calculation (min) 35 min    Activity Tolerance Patient tolerated treatment well    Behavior During Therapy WFL for tasks assessed/performed             Past Medical History:  Diagnosis Date   Acid reflux    Anxiety    Arthritis    Asthma    as a child   Basal cell carcinoma 01/26/2021   RIGHT POST SURICULAR INFERIOR   Basal cell carcinoma 01/26/2021   RIGHT POST AURICULAR SUPERIOR   Benign brain tumor (Anthony Downs)    Cancer (Anthony Downs)    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, squamous behind ear   Complication of anesthesia    slow to wake up and pain medicines make him sick   GERD (gastroesophageal reflux disease)    Hypercholesteremia    Hypertension    PONV (postoperative nausea and vomiting)    Sleep apnea    no Cpap   Subdural hematoma, post-traumatic (Anthony Downs) 2008   fell off truck hit head on concrete   Past Surgical History:  Procedure Laterality Date   APPLICATION OF CRANIAL NAVIGATION Left 04/29/2020   Procedure: [place];  Surgeon: Anthony Part, MD;  Location: [place];  Service: Neurosurgery;  Laterality: Left;   arthroscopic knee     COLONOSCOPY N/A 11/29/2018   Procedure: COLONOSCOPY;  Surgeon: Anthony Houston, MD;  [facility];  Service: Endoscopy;  Laterality: N/A;  730-rescheduled 9/2 same time per Anthony Downs   CRANIOTOMY Left 04/29/2020   Procedure: LEFT  CRANIECTOMY WITH TUMOR EXCISION;  Surgeon: Anthony Part, MD;  Location: [place];  Service: Neurosurgery;  Laterality: Left;   KNEE SURGERY Right    NASAL SEPTOPLASTY W/ TURBINOPLASTY Bilateral 03/19/2020   Procedure: NASAL SEPTOPLASTY WITH BILATERAL  TURBINATE REDUCTION;  Surgeon: Anthony Baptist, MD;  Location: [place];  Service: ENT;  Laterality: Bilateral;   VASECTOMY     Patient Active Problem List   Diagnosis Date Noted   Seizure (Anthony Downs) 08/13/2020   Seizures (Anthony Downs) 08/12/2020   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   DVT (deep venous thrombosis) ([place]) 08/12/2020   COVID-19 virus infection 08/12/2020   Acute lower UTI 07/14/2020   Delirium 07/06/2020   Sleep apnea 07/06/2020   GERD (gastroesophageal reflux disease) 07/06/2020   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Anthony Downs) 06/25/2020   Sundowning 06/25/2020   Slow transit constipation    Essential hypertension    Hypokalemia    Postoperative pain    Meningioma (Anthony Downs) 05/02/2020   Brain tumor ([place]) 04/28/2020   S/P nasal septoplasty 03/19/2020   Special screening for malignant neoplasms, colon 01/09/2018    ONSET DATE: 02/19/2022  REFERRING DIAG: D32.9 (ICD-10-CM) - Meningioma (Anthony Downs) G82.50 (ICD-10-CM) - 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Anthony Downs)  THERAPY DIAG:  Muscle weakness (generalized)  Abnormal posture  Unsteadiness on feet  Other abnormalities of gait and mobility  Meningioma (Anthony Downs)  Rationale for Evaluation and Treatment: Rehabilitation  SUBJECTIVE:                                                                                                                                                                                             SUBJECTIVE STATEMENT: Pt's wife reports that both she and the pt have been sick over the past few months, with RSV then Covid 2 weeks ago. She also reports that the patient broke his ribs on the L side from coughing. Pt still having some congestion today.  Pt was able to stand in // bars and work on L/R weight shift x 50 reps after  initial d/c from PT in August 2023. Pt also has been working on seated marches in [facility] and has been working on standing to his RW but is only successful standing from his lift chair if it is elevated. Pt primarily stands to his stedy. Pt's wife also reports that the patient's hospital bed is too soft to push up from to stand to the RW. She reports that they have tried a SB transfer at home and it was not safe with 3 family members assisting. She reports that the pt can assist with lifting his RLE in the bed during pericare if movement is "primed" first.  Pt's wife feels that the pt is weaker overall since last seen by therapy, a combination of being sick over the past few months and having poor initiation to work on much at home. Pt's wife also reports that pt seems to have less energy, sleeps a lot and is easily fatigued by minimal physical activity.  Pt's goal this time is to work on being able to better stand to the RW and work towards stand pivot transfers to increase his access to the community and to visit family.  Pt accompanied by: significant other wife Anthony Downs  PERTINENT HISTORY: anxiety, OA, cancer, subdural hematoma   PAIN:  Are you having pain? Yes: NPRS scale: 5/10 Pain location: L rib region Pain description: soreness Aggravating factors: coughing Relieving factors: repositioning  PRECAUTIONS: Fall  WEIGHT BEARING RESTRICTIONS: No  FALLS: Has patient fallen in last 6 months? 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 There are mild chronic  small-vessel ischemic changes of the white matter. There is atrophy, encephalomalacia and gliosis along the medial aspects of both sides of the brain at the frontoparietal vertex, sequela of the previous tumor and subsequent resection. It appears that the midportion of the superior sagittal sinus has been resected. No evidence of tumor in the venous sinuses anterior or posterior to that.   From MRI on 06/07/2021: Redemonstrated sequela of prior vertex craniotomy/cranioplasty and parafalcine meningioma resection with partial resection of the superior sagittal sinus. No evidence of residual/recurrent tumor at the resection site.   Chronic intracranial findings without interval change, as described.   Paranasal sinus disease, as outlined.  COGNITION: Overall cognitive status: Impaired   SENSATION: Light touch: WFL  EDEMA:  Pitting edema in BLE, has been this way x 2 years, on fluid pills  POSTURE: posterior pelvic tilt   LOWER EXTREMITY MMT:    MMT Right Eval Left Eval  Hip flexion 2- 2-  Hip extension    Hip abduction    Hip adduction    Hip internal rotation    Hip external rotation    Knee flexion 2- 5  Knee extension 2- 5  Ankle dorsiflexion 0 0  Ankle plantarflexion    Ankle inversion    Ankle eversion    (Blank rows = not tested)  BED MOBILITY:  Sit to supine Total A Supine to sit Total A Rolling to Right Total A Rolling to Left Total A  Per family report, unable to assess at eval due to time constraints.   TODAY'S TREATMENT:                                                                                                                              PT evaluation    PATIENT EDUCATION: Education details: Eval findings, PT POC Person educated: Patient and Spouse Education method: Explanation Education comprehension: verbalized understanding and needs further education  HOME EXERCISE PROGRAM: To be reviewed and added to next session  GOALS: Goals  reviewed with patient? Yes  SHORT TERM GOALS: Target date: 04/07/2022    Pt and family will be independent with initial review of HEP for improved strength, balance, and transfers. Baseline: Goal status: INITIAL  2.  Pt will demonstrate ability to roll L and R with max A x 1 to decrease caregiver burden Baseline: total A Goal status: INITIAL  3.  Pt will perform slide board transfer with total A in clinic setting Baseline: unsafe to perform at home Goal status: INITIAL  4.  Pt will perform sit to stand with LRAD and max A x 1 Baseline: not assessed at eval Goal status: INITIAL   LONG TERM GOALS: Target date: 05/18/2022   Pt and family will be independent with final HEP for improved strength, balance, and transfers. Baseline:  Goal status: INITIAL  2.  Pt will demonstrate ability to roll L and R with mod A x 1 to decrease caregiver burden Baseline: total A Goal status: INITIAL  3.  Pt will perform SB transfer with max A x 1 to demonstrate improved functional mobility and decrease caregiver burden Baseline:  Goal status: INITIAL  4.  Pt will tolerate standing x 5 min while performing functional task to demonstrate improved functional mobility Baseline: not assessed at eval Goal status: INITIAL  5.  Pt will perform sit to stand with mod A x 1 to demonstrate improved functional mobility and decreased caregiver burden Baseline: not assessed at eval Goal status: INITIAL  6.  Pt will perform stand pivot transfer with RW and mod A x 1 to demonstrate improved functional mobility Baseline: not assessed at eval Goal status: INITIAL  ASSESSMENT:  CLINICAL IMPRESSION: Patient is a 72 year old male referred to Neuro OPPT for tetrapelgia due to his meningioma.   Pt's PMH is significant for: anxiety, OA, cancer, subdural hematoma.  The following deficits were present during the exam: decreased LE strength and ROM, decreased independence with bed mobility, transfers, and standing.  Based on his significant physical limitations as well as cognitive impairments, pt is an increased risk for falls. Pt would benefit from skilled PT to address these impairments and functional limitations to maximize functional mobility independence.  OBJECTIVE IMPAIRMENTS: decreased activity tolerance, decreased balance, decreased cognition, decreased coordination, decreased endurance, decreased mobility, difficulty walking, decreased ROM, decreased strength, decreased safety awareness, increased edema, and impaired flexibility.   ACTIVITY LIMITATIONS: sitting, standing, transfers, bed mobility, and continence  PARTICIPATION LIMITATIONS: driving and community activity  PERSONAL FACTORS: Age, Behavior pattern, Time since onset of injury/illness/exacerbation, and 3+ comorbidities:    anxiety, OA, cancer, subdural hematoma are also affecting patient's functional outcome.   REHAB POTENTIAL: Fair time since onset of diagnosis; poor pt initiation and motivation  CLINICAL DECISION MAKING: Stable/uncomplicated  EVALUATION COMPLEXITY: Low  PLAN:  PT FREQUENCY: 1-2x/week  PT DURATION: 12 weeks  PLANNED INTERVENTIONS: Therapeutic exercises, Therapeutic activity, Neuromuscular re-education, Balance training, Gait training, Patient/Family education, Self Care, Joint mobilization, Vestibular training, Canalith repositioning, Visual/preceptual remediation/compensation, Orthotic/Fit training, DME instructions, Dry Needling, Cognitive remediation, Wheelchair mobility training, Spinal mobilization, Cryotherapy, Moist heat, Taping, Manual therapy, and Re-evaluation  PLAN FOR NEXT SESSION: review pressure relief techniques, assess standing to stedy, // bars, bari RW if safe and able, work on SB transfers, work on bed mobility (rolling, supine to/from sit)   Excell Seltzer, PT, DPT, CSRS 02/24/2022, 11:32 AM

## 2022-02-26 ENCOUNTER — Ambulatory Visit (HOSPITAL_COMMUNITY)
Admission: RE | Admit: 2022-02-26 | Discharge: 2022-02-26 | Disposition: A | Discharge status: Home / Self Care | Payer: Medicare Other | Source: Ambulatory Visit | Attending: Orthopedic Surgery | Admitting: Orthopedic Surgery

## 2022-02-26 ENCOUNTER — Encounter (HOSPITAL_COMMUNITY): Payer: Self-pay

## 2022-02-26 DIAGNOSIS — M19011 Primary osteoarthritis, right shoulder: Secondary | ICD-10-CM | POA: Diagnosis not present

## 2022-02-26 DIAGNOSIS — M19019 Primary osteoarthritis, unspecified shoulder: Secondary | ICD-10-CM | POA: Diagnosis present

## 2022-02-26 MED ORDER — METHYLPREDNISOLONE ACETATE 40 MG/ML IJ SUSP
INTRAMUSCULAR | Status: AC
  Administered 2022-02-26: 40 mg via INTRA_ARTICULAR
  Filled 2022-02-26: qty 1

## 2022-02-26 MED ORDER — BUPIVACAINE HCL (PF) 0.5 % IJ SOLN
INTRAMUSCULAR | Status: AC
  Administered 2022-02-26: 150 mg
  Filled 2022-02-26: qty 30

## 2022-02-26 MED ORDER — LIDOCAINE HCL (PF) 1 % IJ SOLN
INTRAMUSCULAR | Status: AC
  Administered 2022-02-26: 5 mL
  Filled 2022-02-26: qty 5

## 2022-02-26 MED ORDER — POVIDONE-IODINE 10 % EX SOLN
CUTANEOUS | Status: AC
  Filled 2022-02-26: qty 14.8

## 2022-02-26 NOTE — Procedures (Signed)
Preprocedure Dx: RIGHT shoulder arthritis Postprocedure Dx: RIGHT shoulder arthritis Procedure  Fluoroscopically guided therapeutic RIGHT shoulder joint injection Radiologist:  Thornton Papas Anesthesia:  2.5 ml of 1% lidocaine Injectate:  40 mg Depo-Medrol and 3 ml Sensorcaine 0.5% Fluoro time:  0 minutes 36 seconds EBL:   None Complications:  None

## 2022-03-04 ENCOUNTER — Ambulatory Visit: Payer: Medicare Other | Admitting: Physical Therapy

## 2022-03-04 ENCOUNTER — Ambulatory Visit (INDEPENDENT_AMBULATORY_CARE_PROVIDER_SITE_OTHER): Payer: Medicare Other | Admitting: Neurology

## 2022-03-04 ENCOUNTER — Encounter: Payer: Self-pay | Admitting: Neurology

## 2022-03-04 VITALS — BP 126/70

## 2022-03-04 DIAGNOSIS — M6281 Muscle weakness (generalized): Secondary | ICD-10-CM | POA: Diagnosis not present

## 2022-03-04 DIAGNOSIS — Z86018 Personal history of other benign neoplasm: Secondary | ICD-10-CM

## 2022-03-04 DIAGNOSIS — G250 Essential tremor: Secondary | ICD-10-CM

## 2022-03-04 DIAGNOSIS — Z5181 Encounter for therapeutic drug level monitoring: Secondary | ICD-10-CM

## 2022-03-04 DIAGNOSIS — R2689 Other abnormalities of gait and mobility: Secondary | ICD-10-CM

## 2022-03-04 DIAGNOSIS — R569 Unspecified convulsions: Secondary | ICD-10-CM

## 2022-03-04 DIAGNOSIS — Z9889 Other specified postprocedural states: Secondary | ICD-10-CM | POA: Diagnosis not present

## 2022-03-04 DIAGNOSIS — R2681 Unsteadiness on feet: Secondary | ICD-10-CM

## 2022-03-04 NOTE — Progress Notes (Signed)
[facility]  PATIENT: ODESSA MCFAIL DOB: 1950-05-17  REFERRING CLINICIAN: Redmond School, MD HISTORY FROM: Spouse [name]  REASON FOR VISIT: Seizure s/p meningioma resection    HISTORICAL  CHIEF COMPLAINT:  Chief Complaint  Patient presents with   Follow-up    Rm 71 with wife. Here for yearly f/u denies any seizure activity.     INTERVAL HISTORY 03/04/2022:  Patient presents today for follow-up, last visit was a year ago.  Since then he has not had any seizure or seizure-like activity.  He remains on the Depakote 500 mg daily.  Reports tremor are better with propranolol 20 mg daily.  He is still undergoing physical therapy.  Wife reports improvement in this strength.   INTERVAL HISTORY 02/26/2021:  Patient presents for follow-up with wife [name].  At last visit in October he did not have any seizures and plan was to decrease the Seroquel because he was getting still sleepy during the daytime.  Since then no additional seizures, wife discontinued Seroquel, discontinued the Atarax and Ritalin, no additional sundowning effects.  Right now he is only taking 1 trazodone at nighttime.  He is more alert more interactive and is setting to restart physical therapy soon, he is making progress with his RLE.  No seizures since last visit.  Currently he does not have any complaints.     INTERVAL HISTORY 11/06/2020 Mr. Bozard presents today for follow-up, he presents today with his sister as his wife [name] has a doctor's appointment.  At last visit on August 15 plan was to discontinue the Keppra and switch him to Depakote.  Per wife he was very somnolent initially, his Depakote was checked and was 106 at that time advised her to decrease Depakote to 500 mg twice daily.  He was also started on Seroquel for delirium and titrated to 50 mg nightly.  Per wife who contacted me via MyChart, she expressed that Jolly is still somnolent during the daytime, sometimes has difficulty  performing physical therapy and she is worried that he will discharged from physical therapy.  He has not had any seizures since last visit.  His sleep is better but still required the Seroquel at night.   HISTORY OF PRESENT ILLNESS:  This is a 72 year old male with past medical history of essential hypertension, essential tremor, meningioma status post removal in April 2022 who is presenting with new onset seizure. In brief, wife reports that patient was normal prior to his meningioma resection, no memory deficits, no behavioral problems and no seizure.  He did have minimal right upper extremity and right lower extremity weakness as he was holding his right arm in close to his body and also dragging his right foot. Because of this, his PMD ordered a MRI brain which showed a meningioma left greater than right with surrounding vasogenic edema in the left posterior frontal lobe which was resected in April 2022.  Per chart review his neurosurgery note reports "postop MRI showed a gross total resection except for some possible residual tumor in the patent portion of the sinus.  The tumor was very difficult to dissected free and appeared to be invasive.  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  His aphasia improved, strength slowly improved.  He was seen by PT/OT\SLP who recommended rehab placement".  He was in rehab for a total of Meningioma removed in April, rehab for 51 days then home. Wife reported  since leaving rehab he is having delirium, sundowning syndrome.  He is on Seroquel 50 mg nightly and trazodone 100 mg nightly but he is not sleeping during the night. In the contrary, he sleeps during the day, per wife it is very difficult to keep him up during the day.   Wife reports on July 26 she heard the bridge the bed shake, when she went to check on him his whole body was shaking, head turned to the right,  eyes rolled back.  She reported episode lasted more than 2 minutes because she was still shaking while she was on the phone with EMS.  After the event, per wife he look like he was passed out.  He was initially taken to [facility] but later transferred to [facility] where he had a EEG that showed left greater than right slowing but no seizures.  He was also found to have a UTI.  He was started on levetiracetam 500 mg twice daily and since leaving the hospital wife denies any additional seizure or seizure-like event.  Since the seizure was reported memory is worse, with less on the right side is worse, and he is more irritable.  Sleep is worse as is not sleeping during the night.   Handedness: Right handed    Seizure Type: Generalized convulsion   Current frequency: only one   Any injuries from seizures: None  Seizure risk factors: Meningioma s/p resection   Previous ASMs: Levetiracetam    Currenty ASMs: Valproic Acid   ASMs side effects: Memory, sleep, irritability with Levetiracetam, Sleep apnea   Brain Images 7/28:  No evidence of residual or recurrent meningioma tumor. Vertex craniectomy and cranioplasty for resection of meningioma. Segmental resection of the superior sagittal sinus. No residual or recurrent tumor seen in the region.  Previous EEGs: 7/27: This study is suggestive of cortical dysfunction arising from left frontocentral region suggestive of underlying structural abnormality. There is also mild diffuse encephalopathy, nonspecific etiology. No seizures or definite epileptiform discharges were seen throughout the recording.   OTHER MEDICAL CONDITIONS: HTN, Essential tremors   REVIEW OF SYSTEMS: Unable to fully obtain ROS due to patient mental status   ALLERGIES: Allergies  Allergen Reactions   Ativan [Lorazepam]     Worsening confusion   Haldol [Haloperidol]     Worsening confusion    HOME MEDICATIONS: Outpatient Medications Prior to Visit  Medication Sig  Dispense Refill   albuterol (VENTOLIN HFA) 108 (90 Base) MCG/ACT inhaler Inhale 2 puffs into the lungs every 4 (four) hours.     atorvastatin (LIPITOR) 10 MG tablet Take 1 tablet (10 mg total) by mouth at bedtime. 30 tablet 0   cetirizine (ZYRTEC) 10 MG tablet Take 10 mg by mouth daily.     cyanocobalamin (VITAMIN B12) 1000 MCG tablet Take 1,000 mcg by mouth daily.     finasteride (PROSCAR) 5 MG tablet Take 1 tablet (5 mg total) by mouth daily. 90 tablet 3   furosemide (LASIX) 20 MG tablet Take 1 tablet (20 mg total) by mouth daily. 30 tablet 0   pantoprazole (PROTONIX) 40 MG tablet Take 1 tablet (40 mg total) by mouth daily. 30 tablet 0   potassium chloride SA (KLOR-CON) 20 MEQ tablet Take 1 tablet (20 mEq total) by mouth daily. 30 tablet 0   propranolol (INDERAL) 20 MG tablet Take 1 tablet (20 mg total) by mouth 3 (three) times daily. (Patient taking differently: Take 20 mg by mouth daily.) 90 tablet 0   Propylene  Glycol-Glycerin (SOOTHE OP) Apply 1 drop to eye daily as needed (dry eyes).     tamsulosin (FLOMAX) 0.4 MG CAPS capsule Take 2 capsules (0.8 mg total) by mouth at bedtime. 180 capsule 3   thiamine 100 MG tablet Take 1 tablet (100 mg total) by mouth daily. 30 tablet 1   zinc gluconate 50 MG tablet Take 50 mg by mouth daily.     divalproex (DEPAKOTE) 500 MG DR tablet Take 1 tablet (500 mg total) by mouth 2 (two) times daily. (Patient taking differently: Take 500 mg by mouth once. One tablet at night) 180 tablet 4   carbidopa-levodopa (SINEMET IR) 10-100 MG tablet TAKE 1 TABLET BY MOUTH TWICE A DAY 180 tablet 2   levofloxacin (LEVAQUIN) 500 MG tablet Take 500 mg by mouth daily.     ondansetron (ZOFRAN-ODT) 4 MG disintegrating tablet Take 1 tablet (4 mg total) by mouth every 8 (eight) hours as needed. 8 tablet 0   OZEMPIC, 0.25 OR 0.5 MG/DOSE, 2 MG/3ML SOPN Inject 0.5 mg into the skin once a week.     predniSONE (DELTASONE) 20 MG tablet Take 2 tablets (40 mg total) by mouth daily with  breakfast. For the next four days 8 tablet 0   rivaroxaban (XARELTO) 20 MG TABS tablet Take 1 tablet (20 mg total) by mouth daily with supper. 30 tablet 0   No facility-administered medications prior to visit.    PAST MEDICAL HISTORY: Past Medical History:  Diagnosis Date   Acid reflux    Anxiety    Arthritis    Asthma    as a child   Basal cell carcinoma 01/26/2021   RIGHT POST SURICULAR INFERIOR   Basal cell carcinoma 01/26/2021   RIGHT POST AURICULAR SUPERIOR   Benign brain tumor ([place])    Cancer ([place])    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, squamous behind ear   Complication of anesthesia    slow to wake up and pain medicines make him sick   GERD (gastroesophageal reflux disease)    Hypercholesteremia    Hypertension    PONV (postoperative nausea and vomiting)    Sleep apnea    no Cpap   Subdural hematoma, post-traumatic ([place]) 2008   fell off truck hit head on concrete    PAST SURGICAL HISTORY: Past Surgical History:  Procedure Laterality Date   APPLICATION OF CRANIAL NAVIGATION Left 04/29/2020   Procedure: [place];  Surgeon: Judith Part, MD;  Location: [facility];  Service: Neurosurgery;  Laterality: Left;   arthroscopic knee     COLONOSCOPY N/A 11/29/2018   Procedure: COLONOSCOPY;  Surgeon: Rogene Houston, MD;  [facility];  Service: Endoscopy;  Laterality: N/A;  730-rescheduled 9/2 same time per Ann   CRANIOTOMY Left 04/29/2020   Procedure: LEFT CRANIECTOMY WITH TUMOR EXCISION;  Surgeon: Judith Part, MD;  Location: [facility];  Service: Neurosurgery;  Laterality: Left;   KNEE SURGERY Right    NASAL SEPTOPLASTY W/ TURBINOPLASTY Bilateral 03/19/2020   Procedure: NASAL SEPTOPLASTY WITH BILATERAL  TURBINATE REDUCTION;  Surgeon: Leta Baptist, MD;  Location: [facility];  Service: ENT;  Laterality: Bilateral;   VASECTOMY      FAMILY HISTORY: Family History  Problem Relation Age of Onset   Breast cancer Mother    Pancreatic cancer  Mother    Aortic aneurysm Father     SOCIAL HISTORY: Social History   Socioeconomic History   Marital status: Married    Spouse name: Not  on file   Number of children: 2   Years of education: 12   Highest education level: High school graduate  Occupational History   Occupation: Retired  Tobacco Use   Smoking status: Never   Smokeless tobacco: Never  Vaping Use   Vaping Use: Never used  Substance and Sexual Activity   Alcohol use: No    Comment: no use since 2008   Drug use: No   Sexual activity: Yes  Other Topics Concern   Not on file  Social History Narrative   Lives with wife.   Right-handed.   Two cans Dr. Malachi Bonds daily.   Social Determinants of Health   Financial Resource Strain: Not on file  Food Insecurity: Not on file  Transportation Needs: Not on file  Physical Activity: Not on file  Stress: Not on file  Social Connections: Not on file  Intimate Partner Violence: Not on file     PHYSICAL EXAM  GENERAL EXAM/CONSTITUTIONAL: Vitals:  Vitals:   03/04/22 1128  BP: 126/70    There is no height or weight on file to calculate BMI. Wt Readings from Last 3 Encounters:  02/01/22 290 lb (131.5 kg)  12/29/21 290 lb (131.5 kg)  11/26/21 263 lb (119.3 kg)   Patient is in no distress; well developed, nourished and groomed; neck is supple, sitting in his electronic wheelchair     EYES: Visual fields full to confrontation, Extraocular movements intacts,   MUSCULOSKELETAL: Gait, strength, tone, movements noted in Neurologic exam below  NEUROLOGIC: MENTAL STATUS:  awake, alert, oriented to person, place normal attention and concentration language fluent, comprehension intact, naming intact fund of knowledge appropriate  CRANIAL NERVE:  2nd, 3rd, 4th, 6th - visual fields full to confrontation, extraocular muscles intact, no nystagmus 5th - facial sensation symmetric 7th - facial strength symmetric 8th - hearing intact 9th - palate elevates  symmetrically, uvula midline 12th - tongue protrusion midline  MOTOR:  RUE tremor noted (has a history of essential tremors)  normal bulk and tone, full strength in the RU and LE extremity.  RUE 4/5. There is also improve in the RLE 2 to 3/5   SENSORY:  normal and symmetric to light touch  GAIT/STATION:  Not done, in a wheelchair.    DIAGNOSTIC DATA (LABS, IMAGING, TESTING) - I reviewed patient records, labs, notes, testing and imaging myself where available.  Lab Results  Component Value Date   WBC 4.8 12/29/2021   HGB 13.1 12/29/2021   HCT 41.5 12/29/2021   MCV 95.2 12/29/2021   PLT 165 12/29/2021      Component Value Date/Time   NA 139 12/29/2021 1116   NA 143 02/26/2021 1521   K 4.2 12/29/2021 1116   CL 103 12/29/2021 1116   CO2 26 12/29/2021 1116   GLUCOSE 147 (H) 12/29/2021 1116   BUN 11 12/29/2021 1116   BUN 13 02/26/2021 1521   CREATININE 1.20 12/29/2021 1116   CALCIUM 8.7 (L) 12/29/2021 1116   PROT 6.4 (L) 12/29/2021 1116   PROT 5.7 (L) 02/26/2021 1521   ALBUMIN 3.3 (L) 12/29/2021 1116   ALBUMIN 3.5 (L) 02/26/2021 1521   AST 28 12/29/2021 1116   ALT 39 12/29/2021 1116   ALKPHOS 88 12/29/2021 1116   BILITOT 0.4 12/29/2021 1116   BILITOT <0.2 02/26/2021 1521   GFRNONAA >60 12/29/2021 1116   GFRAA 70 (L) 04/04/2011 2128   No results found for: "CHOL", "HDL", "LDLCALC", "LDLDIRECT", "TRIG" Lab Results  Component Value Date   HGBA1C  6.0 (H) 07/07/2020   Lab Results  Component Value Date   [id number] 08/14/2020   Lab Results  Component Value Date   TSH 4.220 02/26/2021    MRI Brain 08/14/2020 No evidence of residual or recurrent meningioma tumor. Vertex craniectomy and cranioplasty for resection of meningioma. Segmental resection of the superior sagittal sinus. No residual or recurrent tumor seen in the region.  CT Venogram 04/28/2020 1. 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 A small portion of the mass does extend to the right of the falx and overlies the medial aspect of the posterior right frontal lobe. Unchanged mass effect upon the left frontal lobe with mild-to-moderate underlying vasogenic edema. Notably, there is mass effect upon the left motor cortex. 2. The parafalcine meningioma fills and expands portions of the superior sagittal sinus. However, anterior and posterior to this, the superior sagittal sinus appears patent. No evidence of intracranial venous thrombosis elsewhere. 3. Subtle remodeling of the inner table of the parietal calvarium adjacent to the mass. 4. Chronic cortically-based foci of encephalomalacia within the anteroinferior left frontal lobe and anterior left temporal lobe, likely posttraumatic in etiology   Routine EEG 08/13/2020 This study is suggestive of cortical dysfunction arising from left frontocentral region suggestive of underlying structural abnormality. There is also mild diffuse encephalopathy, nonspecific etiology. No seizures or definite epileptiform discharges were seen throughout the recording.   I personally reviewed brain Images and previous EEG reports.   ASSESSMENT AND PLAN  72 y.o. year old male with past medical history of parafalcine meningioma s/p resection, seizure disorder, essential tremor, hypertension, diabetes who is presenting for following.  No seizures since last visit, doing well on Depakote 500 mg daily.  He is most more engaged today and has restarted physical therapy.  At this time, l will continue the patient on Depakote for at least 1 more year and if no seizures we will discuss about coming off medication as he wants to come off the Depakote.  I have also encouraged him to continue with physical therapy and also to do the exercises at home.  He voices understanding. Advise them to continue with the propanolol for his essential tremors. I will see him in 1 year or  sooner if worse.    1. Seizures ([place])   2. Therapeutic drug monitoring   3. S/P resection of meningioma   4. Essential tremor      Patient Instructions  Continue with Depakote 500 mg nighty Continue with propanolol 20 daily for the essential tremors  Continue with physical therapy Continue your other medications  Follow up in 1 year or sooner if worse    Per [facility] statutes, patients with seizures are not allowed to drive until they have been seizure-free for six months.  Other recommendations include using caution when using heavy equipment or power tools. Avoid working on ladders or at heights. Take showers instead of baths.  Do not swim alone.  Ensure the water temperature is not too high on the home water heater. Do not go swimming alone. Do not lock yourself in a room alone (i.e. bathroom). When caring for infants or small children, sit down when holding, feeding, or changing them to minimize risk of injury to the child in the event you have a seizure. Maintain good sleep hygiene. Avoid alcohol.  Also recommend adequate sleep, hydration, good diet and minimize stress.   During  the Seizure  - First, ensure adequate ventilation and place patients on the floor on their left side  Loosen clothing around the neck and ensure the airway is patent. If the patient is clenching the teeth, do not force the mouth open with any object as this can cause severe damage - Remove all items from the surrounding that can be hazardous. The patient may be oblivious to what's happening and may not even know what he or she is doing. If the patient is confused and wandering, either gently guide him/her away and block access to outside areas - Reassure the individual and be comforting - Call 911. In most cases, the seizure ends before EMS arrives. However, there are cases when seizures may last over 3 to 5 minutes. Or the individual may have developed breathing difficulties or severe injuries. If  a pregnant patient or a person with diabetes develops a seizure, it is prudent to call an ambulance. - Finally, if the patient does not regain full consciousness, then call EMS. Most patients will remain confused for about 45 to 90 minutes after a seizure, so you must use judgment in calling for help. - Avoid restraints but make sure the patient is in a bed with padded side rails - Place the individual in a lateral position with the neck slightly flexed; this will help the saliva drain from the mouth and prevent the tongue from falling backward - Remove all nearby furniture and other hazards from the area - Provide verbal assurance as the individual is regaining consciousness - Provide the patient with privacy if possible - Call for help and start treatment as ordered by the caregiver   After the Seizure (Postictal Stage)  After a seizure, most patients experience confusion, fatigue, muscle pain and/or a headache. Thus, one should permit the individual to sleep. For the next few days, reassurance is essential. Being calm and helping reorient the person is also of importance.  Most seizures are painless and end spontaneously. Seizures are not harmful to others but can lead to complications such as stress on the lungs, brain and the heart. Individuals with prior lung problems may develop labored breathing and respiratory distress.     Orders Placed This Encounter  Procedures   Valproic Acid Level   Hepatic Function Panel     No orders of the defined types were placed in this encounter.    Return in about 1 year (around 03/05/2023).    Alric Ran, MD 03/04/2022, 12:11 PM  [facility] [address] [place], [place] [address] [phone]

## 2022-03-04 NOTE — Patient Instructions (Signed)
Continue with Depakote 500 mg nighty Continue with propanolol 20 daily for the essential tremors  Continue with physical therapy Continue your other medications  Follow up in 1 year or sooner if worse

## 2022-03-04 NOTE — Therapy (Signed)
OUTPATIENT PHYSICAL THERAPY NEURO TREATMENT   Patient Name: Anthony Downs MRN: [id number] DOB:Apr 17, 1950, 72 y.o., male Today's Date: 03/04/2022   PCP: Redmond School, MD REFERRING PROVIDER: [name], MD  END OF SESSION:  PT End of Session - 03/04/22 1322     Visit Number 2    Number of Visits 25   with eval   Date for PT Re-Evaluation 05/18/22    Authorization Type Medicare and UHC (needs 10th visit progress note)    Authorization - Number of Visits 30   30 VL   Progress Note Due on Visit 10    PT Start Time 1320   Pt got appointment time mixed up   PT Stop Time 1403    PT Time Calculation (min) 43 min    Equipment Utilized During Treatment Gait belt    Activity Tolerance Patient limited by fatigue    Behavior During Therapy WFL for tasks assessed/performed              Past Medical History:  Diagnosis Date   Acid reflux    Anxiety    Arthritis    Asthma    as a child   Basal cell carcinoma 01/26/2021   RIGHT POST SURICULAR INFERIOR   Basal cell carcinoma 01/26/2021   RIGHT POST AURICULAR SUPERIOR   Benign brain tumor ([place])    Cancer ([employer])    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, squamous behind ear   Complication of anesthesia    slow to wake up and pain medicines make him sick   GERD (gastroesophageal reflux disease)    Hypercholesteremia    Hypertension    PONV (postoperative nausea and vomiting)    Sleep apnea    no Cpap   Subdural hematoma, 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   fell off truck hit head on concrete   Past Surgical History:  Procedure Laterality Date   APPLICATION OF CRANIAL NAVIGATION Left 04/29/2020   Procedure: [place];  Surgeon: Judith Part, MD;  Location: [place];  Service: Neurosurgery;  Laterality: Left;   arthroscopic knee     COLONOSCOPY N/A 11/29/2018   Procedure: COLONOSCOPY;  Surgeon: Rogene Houston, MD;  [facility];  Service: Endoscopy;  Laterality: N/A;  730-rescheduled  9/2 same time per Ann   CRANIOTOMY Left 04/29/2020   Procedure: LEFT CRANIECTOMY WITH TUMOR EXCISION;  Surgeon: Judith Part, MD;  Location: [facility];  Service: Neurosurgery;  Laterality: Left;   KNEE SURGERY Right    NASAL SEPTOPLASTY W/ TURBINOPLASTY Bilateral 03/19/2020   Procedure: NASAL SEPTOPLASTY WITH BILATERAL  TURBINATE REDUCTION;  Surgeon: Leta Baptist, MD;  Location: [facility];  Service: ENT;  Laterality: Bilateral;   VASECTOMY     Patient Active Problem List   Diagnosis Date Noted   Seizure ([place]) 08/13/2020   Seizures ([place]) 08/12/2020   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   DVT (deep venous thrombosis) ([place]) 08/12/2020   COVID-19 virus infection 08/12/2020   Acute lower UTI 07/14/2020   Delirium 07/06/2020   Sleep apnea 07/06/2020   GERD (gastroesophageal reflux disease) 07/06/2020   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[place]) 06/25/2020   Sundowning 06/25/2020   Slow transit constipation    Essential hypertension    Hypokalemia    Postoperative pain    Meningioma ([place]) 05/02/2020   Brain tumor ([place]) 04/28/2020   S/P nasal septoplasty 03/19/2020   Special screening for malignant neoplasms, colon 01/09/2018    ONSET DATE: 02/19/2022  REFERRING DIAG: D32.9 (ICD-10-CM) -  Meningioma ([facility]CC) G82.50 (ICD-10-CM) - 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[place])  THERAPY DIAG:  Muscle weakness (generalized)  Unsteadiness on feet  Other abnormalities of gait and mobility  Rationale for Evaluation and Treatment: Rehabilitation  SUBJECTIVE:                                                                                                                                                                                             SUBJECTIVE STATEMENT: Pt presents in power chair w/new gel hybrid cushion, reports his bottom is aching. No new changes.   Pt accompanied by: significant other wife [name]  PERTINENT HISTORY: anxiety, OA, cancer, subdural hematoma   PAIN:  Are you having pain? Yes: NPRS scale: 6/10 Pain location:  Buttocks Pain description: soreness Aggravating factors: coughing Relieving factors: repositioning  PRECAUTIONS: Fall  WEIGHT BEARING RESTRICTIONS: No  FALLS: Has patient fallen in last 6 months? 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 There are mild chronic small-vessel ischemic changes of the white matter. There is atrophy, encephalomalacia and gliosis along the medial aspects of both sides of the brain at the frontoparietal vertex, sequela of the previous tumor and subsequent resection. It appears that the midportion of the superior sagittal sinus has been resected. No evidence of tumor in the venous sinuses anterior or posterior to that.   From MRI on 06/07/2021: Redemonstrated sequela of prior vertex craniotomy/cranioplasty and parafalcine meningioma resection with partial resection of the superior sagittal sinus. No evidence of residual/recurrent tumor at the resection site.   Chronic intracranial findings without interval change, as described.   Paranasal sinus disease, as outlined.  COGNITION: Overall cognitive status: Impaired   SENSATION: Light touch: WFL  EDEMA:  Pitting edema in BLE, has been this way x 2 years, on fluid pills  POSTURE: posterior pelvic tilt   LOWER EXTREMITY MMT:    MMT Right Eval Left Eval  Hip flexion 2- 2-  Hip extension    Hip abduction    Hip adduction    Hip internal rotation    Hip external rotation    Knee flexion 2- 5  Knee extension 2- 5  Ankle dorsiflexion 0 0  Ankle plantarflexion    Ankle  inversion    Ankle  eversion    (Blank rows = not tested)  BED MOBILITY: per family report  Sit to supine Total A Supine to sit Total A Rolling to Right Total A Rolling to Left Total A  TODAY'S TREATMENT:   NMR  Sit <>stand using Stedy and CGA and pt held stand for 3 minutes and 2.5 minutes w/heavy forward lean and reliance on BUEs. Lengthy seated rest break required between trials due to fatigue. Min cues for upright posture, but pt unable to hold longer than 10s. Pt unable to stand unless pulling to stand, tried staggered hand position to imitate standing to RW but pt unsuccessful. RPE of 6-7/10 following first stand and 8/10 following second.  Pt required mod A to scoot hips back into chair and max encouraging cues to perform as pt was fatigued and requested therapist perform for him.  Educated pt and [name] on importance of working on quality standing at home (upright posture, weight through BLEs, pushing up to stand vs pulling) as these are necessary for safe transfers w/RW. Pt and wife verbalized understanding.    PATIENT EDUCATION: Education details: Standing in stedy, performing HEP (pt still has handout from previous bout of PT)  Person educated: Patient and Spouse Education method: Explanation Education comprehension: verbalized understanding and needs further education  HOME EXERCISE PROGRAM: Access code: [email or web address] (from previous bout of therapy)   GOALS: Goals reviewed with patient? Yes  SHORT TERM GOALS: Target date: 04/07/2022    Pt and family will be independent with initial review of HEP for improved strength, balance, and transfers. Baseline: Goal status: INITIAL  2.  Pt will demonstrate ability to roll L and R with max A x 1 to decrease caregiver burden Baseline: total A Goal status: INITIAL  3.  Pt will perform slide board transfer with total A in clinic setting Baseline: unsafe to perform at home Goal status: INITIAL  4.  Pt will perform sit to stand with  LRAD and max A x 1 Baseline: not assessed at eval Goal status: INITIAL   LONG TERM GOALS: Target date: 05/18/2022   Pt and family will be independent with final HEP for improved strength, balance, and transfers. Baseline:  Goal status: INITIAL  2.  Pt will demonstrate ability to roll L and R with mod A x 1 to decrease caregiver burden Baseline: total A Goal status: INITIAL  3.  Pt will perform SB transfer with max A x 1 to demonstrate improved functional mobility and decrease caregiver burden Baseline:  Goal status: INITIAL  4.  Pt will tolerate standing x 5 min while performing functional task to demonstrate improved functional mobility Baseline: not assessed at eval Goal status: INITIAL  5.  Pt will perform sit to stand with mod A x 1 to demonstrate improved functional mobility and decreased caregiver burden Baseline: not assessed at eval Goal status: INITIAL  6.  Pt will perform stand pivot transfer with RW and mod A x 1 to demonstrate improved functional mobility Baseline: not assessed at eval Goal status: INITIAL  ASSESSMENT:  CLINICAL IMPRESSION: Emphasis of skilled PT session on assessing sit <>stand transfers and standing tolerance. Pt able to perform sit <>stand w/CGA using stedy but must pull to stand, cannot initiate push to stand. Pt able to hold stand for 2.5-3 minutes at a time but demonstrated significant decline in quality of stand (leaning forward over steady, relying on hands) after 30-45 seconds. Pt reported RPE of 8/10 following second stand, so encouraged pt to practice standing  at home in stedy w/upright posture, as he will need posterior chain strength and upright posture to safely stand to RW. Pt verbalized understanding. Continue POC.   OBJECTIVE IMPAIRMENTS: decreased activity tolerance, decreased balance, decreased cognition, decreased coordination, decreased endurance, decreased mobility, difficulty walking, decreased ROM, decreased strength, decreased  safety awareness, increased edema, and impaired flexibility.   ACTIVITY LIMITATIONS: sitting, standing, transfers, bed mobility, and continence  PARTICIPATION LIMITATIONS: driving and community activity  PERSONAL FACTORS: Age, Behavior pattern, Time since onset of injury/illness/exacerbation, and 3+ comorbidities:    anxiety, OA, cancer, subdural hematoma are also affecting patient's functional outcome.   REHAB POTENTIAL: Fair time since onset of diagnosis; poor pt initiation and motivation  CLINICAL DECISION MAKING: Stable/uncomplicated  EVALUATION COMPLEXITY: Low  PLAN:  PT FREQUENCY: 1-2x/week  PT DURATION: 12 weeks  PLANNED INTERVENTIONS: Therapeutic exercises, Therapeutic activity, Neuromuscular re-education, Balance training, Gait training, Patient/Family education, Self Care, Joint mobilization, Vestibular training, Canalith repositioning, Visual/preceptual remediation/compensation, Orthotic/Fit training, DME instructions, Dry Needling, Cognitive remediation, Wheelchair mobility training, Spinal mobilization, Cryotherapy, Moist heat, Taping, Manual therapy, and Re-evaluation  PLAN FOR NEXT SESSION: review pressure relief techniques, assess standing to stedy, // bars, bari RW if safe and able, work on SB transfers, work on bed mobility (rolling, supine to/from sit)   Cruzita Lederer Mayar Whittier, PT, DPT 03/04/2022, 2:04 PM

## 2022-03-05 ENCOUNTER — Ambulatory Visit: Payer: Medicare Other | Admitting: Physical Therapy

## 2022-03-05 DIAGNOSIS — M6281 Muscle weakness (generalized): Secondary | ICD-10-CM | POA: Diagnosis not present

## 2022-03-05 DIAGNOSIS — R2689 Other abnormalities of gait and mobility: Secondary | ICD-10-CM

## 2022-03-05 DIAGNOSIS — R2681 Unsteadiness on feet: Secondary | ICD-10-CM

## 2022-03-05 DIAGNOSIS — R293 Abnormal posture: Secondary | ICD-10-CM

## 2022-03-05 DIAGNOSIS — D329 Benign neoplasm of meninges, unspecified: Secondary | ICD-10-CM

## 2022-03-05 LAB — HEPATIC FUNCTION PANEL
ALT: 41 IU/L (ref 0–44)
AST: 32 IU/L (ref 0–40)
Albumin: 3.9 g/dL (ref 3.8–4.8)
Alkaline Phosphatase: 111 IU/L (ref 44–121)
Bilirubin Total: 0.3 mg/dL (ref 0.0–1.2)
Bilirubin, Direct: 0.11 mg/dL (ref 0.00–0.40)
Total Protein: 6.4 g/dL (ref 6.0–8.5)

## 2022-03-05 LAB — VALPROIC ACID LEVEL: Valproic Acid Lvl: 28 ug/mL — ABNORMAL LOW (ref 50–100)

## 2022-03-05 NOTE — Therapy (Signed)
OUTPATIENT PHYSICAL THERAPY NEURO TREATMENT   Patient Name: Anthony Downs MRN: [id number] DOB:[birth year], 72 y.o., male Today's Date: 03/05/2022   PCP: Redmond School, MD REFERRING PROVIDER: [name], MD  END OF SESSION:  PT End of Session - 03/05/22 1405     Visit Number 3    Number of Visits 25   with eval   Date for PT Re-Evaluation 05/18/22    Authorization Type Medicare and UHC (needs 10th visit progress note)    Authorization - Number of Visits 30   30 VL   Progress Note Due on Visit 10    PT Start Time 1405    PT Stop Time 1445    PT Time Calculation (min) 40 min    Equipment Utilized During Treatment Gait belt    Activity Tolerance Patient limited by fatigue    Behavior During Therapy WFL for tasks assessed/performed               Past Medical History:  Diagnosis Date   Acid reflux    Anxiety    Arthritis    Asthma    as a child   Basal cell carcinoma 01/26/2021   RIGHT POST SURICULAR INFERIOR   Basal cell carcinoma 01/26/2021   RIGHT POST AURICULAR SUPERIOR   Benign brain tumor ([place])    Cancer ([place])    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, squamous behind ear   Complication of anesthesia    slow to wake up and pain medicines make him sick   GERD (gastroesophageal reflux disease)    Hypercholesteremia    Hypertension    PONV (postoperative nausea and vomiting)    Sleep apnea    no Cpap   Subdural hematoma, 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   fell off truck hit head on concrete   Past Surgical History:  Procedure Laterality Date   APPLICATION OF CRANIAL NAVIGATION Left 04/29/2020   Procedure: [place];  Surgeon: Judith Part, MD;  Location: [facility];  Service: Neurosurgery;  Laterality: Left;   arthroscopic knee     COLONOSCOPY N/A 11/29/2018   Procedure: COLONOSCOPY;  Surgeon: Rogene Houston, MD;  [facility];  Service: Endoscopy;  Laterality: N/A;  730-rescheduled 9/2 same time per Ann    CRANIOTOMY Left 04/29/2020   Procedure: LEFT CRANIECTOMY WITH TUMOR EXCISION;  Surgeon: Judith Part, MD;  Location: [facility];  Service: Neurosurgery;  Laterality: Left;   KNEE SURGERY Right    NASAL SEPTOPLASTY W/ TURBINOPLASTY Bilateral 03/19/2020   Procedure: NASAL SEPTOPLASTY WITH BILATERAL  TURBINATE REDUCTION;  Surgeon: Leta Baptist, MD;  Location: [facility];  Service: ENT;  Laterality: Bilateral;   VASECTOMY     Patient Active Problem List   Diagnosis Date Noted   Seizure ([place]) 08/13/2020   Seizures ([place]) 08/12/2020   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   DVT (deep venous thrombosis) ([place]) 08/12/2020   COVID-19 virus infection 08/12/2020   Acute lower UTI 07/14/2020   Delirium 07/06/2020   Sleep apnea 07/06/2020   GERD (gastroesophageal reflux disease) 07/06/2020   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[place]) 06/25/2020   Sundowning 06/25/2020   Slow transit constipation    Essential hypertension    Hypokalemia    Postoperative pain    Meningioma ([place]) 05/02/2020   Brain tumor ([place]) 04/28/2020   S/P nasal septoplasty 03/19/2020   Special screening for malignant neoplasms, colon 01/09/2018    ONSET DATE: 02/19/2022  REFERRING DIAG: D32.9 (ICD-10-CM) - Meningioma ([place]) G82.50 (ICD-10-CM) - 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  [place])  THERAPY DIAG:  Muscle weakness (generalized)  Unsteadiness on feet  Other abnormalities of gait and mobility  Abnormal posture  Meningioma ([facility])  Rationale for Evaluation and Treatment: Rehabilitation  SUBJECTIVE:                                                                                                                                                                                             SUBJECTIVE STATEMENT: Pt reports no falls since last visit, no other acute changes. Pt reports no pain today, so far his new cushion is more comfortable and his butt is not hurting right now.  Pt accompanied by: significant other wife [name]  PERTINENT HISTORY: anxiety, OA, cancer, subdural  hematoma   PAIN:  Are you having pain? Yes: NPRS scale: 6/10 Pain location: Buttocks Pain description: soreness Aggravating factors: coughing Relieving factors: repositioning  PRECAUTIONS: Fall  WEIGHT BEARING RESTRICTIONS: No  FALLS: Has patient fallen in last 6 months? 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 There are mild chronic small-vessel ischemic changes of the white matter. There is atrophy, encephalomalacia and gliosis along the medial aspects of both sides of the brain at the frontoparietal vertex, sequela of the previous tumor and subsequent resection. It appears that the midportion of the superior sagittal sinus has been resected. No evidence of tumor in the venous sinuses anterior or posterior to that.   From MRI on 06/07/2021: Redemonstrated sequela of prior vertex craniotomy/cranioplasty and parafalcine meningioma resection with partial resection of the superior sagittal sinus. No evidence of residual/recurrent tumor at the resection site.   Chronic intracranial findings without interval change, as described.   Paranasal sinus disease, as outlined.  COGNITION: Overall cognitive status: Impaired   SENSATION: Light touch: WFL  EDEMA:  Pitting edema in BLE, has been this way x 2 years, on fluid pills  POSTURE: posterior pelvic tilt   LOWER EXTREMITY MMT:    MMT Right Eval Left Eval  Hip flexion 2- 2-  Hip extension    Hip abduction    Hip adduction    Hip internal rotation    Hip external rotation    Knee flexion 2- 5   Knee extension 2- 5  Ankle dorsiflexion  0 0  Ankle plantarflexion    Ankle inversion    Ankle eversion    (Blank rows = not tested)  BED MOBILITY: per family report  Sit to supine Total A Supine to sit Total A Rolling to Right Total A Rolling to Left Total A  TODAY'S TREATMENT:   NMR  Pt received seated in PWC. Slide board transfer to the L from [place] to mat table with max A +2. Pt with difficulty maintaining lean to the R during transfer. Sitting balance EOM with lateral leans x 1 rep to the L (painful to L due to ribs injury from coughing), x 5 reps to the R. Seated mini-crunches from upside down chair on mat table at SBA level. Slide board transfer back to the L to [place] with mod A x 2 initially, progresses to mod A x 1 once pt able to reach arm of chair to pull himself over. Reviewed how pt can perform seated lateral leans onto bed or other arm level surface while seated in PWC (with Supervision) and that pt can perform mini-crunches from his PWC in semi-reclined position or his recliner (again with Supervision).   PATIENT EDUCATION: Education details: verbally added to HEP Person educated: Patient and Spouse Education method: Explanation Education comprehension: verbalized understanding and needs further education  HOME EXERCISE PROGRAM: Access code: [email or web address] (from previous bout of therapy)   Verbally added 03/05/22: Seated L/R lateral leans from [place] x 5 reps onto level surface Seated mini-crunches from semi-reclined PWC or recliner x 10 reps  GOALS: Goals reviewed with patient? Yes  SHORT TERM GOALS: Target date: 04/07/2022    Pt and family will be independent with initial review of HEP for improved strength, balance, and transfers. Baseline: Goal status: INITIAL  2.  Pt will demonstrate ability to roll L and R with max A x 1 to decrease caregiver burden Baseline: total A Goal status: INITIAL  3.  Pt will perform slide board transfer with total A in clinic  setting Baseline: unsafe to perform at home Goal status: INITIAL  4.  Pt will perform sit to stand with LRAD and max A x 1 Baseline: not assessed at eval Goal status: INITIAL   LONG TERM GOALS: Target date: 05/18/2022   Pt and family will be independent with final HEP for improved strength, balance, and transfers. Baseline:  Goal status: INITIAL  2.  Pt will demonstrate ability to roll L and R with mod A x 1 to decrease caregiver burden Baseline: total A Goal status: INITIAL  3.  Pt will perform SB transfer with max A x 1 to demonstrate improved functional mobility and decrease caregiver burden Baseline:  Goal status: INITIAL  4.  Pt will tolerate standing x 5 min while performing functional task to demonstrate improved functional mobility Baseline: not assessed at eval Goal status: INITIAL  5.  Pt will perform sit to stand with mod A x 1 to demonstrate improved functional mobility and decreased caregiver burden Baseline: not assessed at eval Goal status: INITIAL  6.  Pt will perform stand pivot transfer with RW and mod A x 1 to demonstrate improved functional mobility Baseline: not assessed at eval Goal status: INITIAL  ASSESSMENT:  CLINICAL IMPRESSION: Emphasis of skilled PT session on working on slide board transfers, sitting balance, and core strengthening. Pt exhibits decreased dependence with slide board transfer at end of session back to chair due to ability to assist himself with w/c armrest. Pt exhibits good ability to perform seated  core strengthening exercises this date without hands-on assist, does remain limited on L side due to rib injury from coughing. Pt continues to benefit from skilled therapy services to address ongoing global weakness leading to decreased independence and safety with functional mobility and increased caregiver burden. Continue POC.   OBJECTIVE IMPAIRMENTS: decreased activity tolerance, decreased balance, decreased cognition, decreased  coordination, decreased endurance, decreased mobility, difficulty walking, decreased ROM, decreased strength, decreased safety awareness, increased edema, and impaired flexibility.   ACTIVITY LIMITATIONS: sitting, standing, transfers, bed mobility, and continence  PARTICIPATION LIMITATIONS: driving and community activity  PERSONAL FACTORS: Age, Behavior pattern, Time since onset of injury/illness/exacerbation, and 3+ comorbidities:    anxiety, OA, cancer, subdural hematoma are also affecting patient's functional outcome.   REHAB POTENTIAL: Fair time since onset of diagnosis; poor pt initiation and motivation  CLINICAL DECISION MAKING: Stable/uncomplicated  EVALUATION COMPLEXITY: Low  PLAN:  PT FREQUENCY: 1-2x/week  PT DURATION: 12 weeks  PLANNED INTERVENTIONS: Therapeutic exercises, Therapeutic activity, Neuromuscular re-education, Balance training, Gait training, Patient/Family education, Self Care, Joint mobilization, Vestibular training, Canalith repositioning, Visual/preceptual remediation/compensation, Orthotic/Fit training, DME instructions, Dry Needling, Cognitive remediation, Wheelchair mobility training, Spinal mobilization, Cryotherapy, Moist heat, Taping, Manual therapy, and Re-evaluation  PLAN FOR NEXT SESSION: review pressure relief techniques, assess standing to stedy, // bars, bari RW if safe and able, work on SB transfers, work on bed mobility (rolling, supine to/from sit), how is HEP going?   Excell Seltzer, PT, DPT, CSRS 03/05/2022, 2:46 PM

## 2022-03-10 ENCOUNTER — Ambulatory Visit: Payer: Medicare Other | Admitting: Physical Therapy

## 2022-03-10 DIAGNOSIS — R2689 Other abnormalities of gait and mobility: Secondary | ICD-10-CM

## 2022-03-10 DIAGNOSIS — D329 Benign neoplasm of meninges, unspecified: Secondary | ICD-10-CM

## 2022-03-10 DIAGNOSIS — R2681 Unsteadiness on feet: Secondary | ICD-10-CM

## 2022-03-10 DIAGNOSIS — M6281 Muscle weakness (generalized): Secondary | ICD-10-CM | POA: Diagnosis not present

## 2022-03-10 DIAGNOSIS — R293 Abnormal posture: Secondary | ICD-10-CM

## 2022-03-10 NOTE — Therapy (Signed)
OUTPATIENT PHYSICAL THERAPY NEURO TREATMENT   Patient Name: Anthony Downs MRN: [id number] DOB:02-04-1950, 72 y.o., male Today's Date: 03/10/2022   PCP: Redmond School, MD REFERRING PROVIDER: [name], MD  END OF SESSION:  PT End of Session - 03/10/22 1146     Visit Number 4    Number of Visits 25   with eval   Date for PT Re-Evaluation 05/18/22    Authorization Type Medicare and UHC (needs 10th visit progress note)    Authorization - Number of Visits 30   30 VL   Progress Note Due on Visit 10    PT Start Time 1145    PT Stop Time 1229    PT Time Calculation (min) 44 min    Equipment Utilized During Treatment Gait belt    Activity Tolerance Patient limited by fatigue    Behavior During Therapy WFL for tasks assessed/performed                Past Medical History:  Diagnosis Date   Acid reflux    Anxiety    Arthritis    Asthma    as a child   Basal cell carcinoma 01/26/2021   RIGHT POST SURICULAR INFERIOR   Basal cell carcinoma 01/26/2021   RIGHT POST AURICULAR SUPERIOR   Benign brain tumor ([place])    Cancer ([place])    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, squamous behind ear   Complication of anesthesia    slow to wake up and pain medicines make him sick   GERD (gastroesophageal reflux disease)    Hypercholesteremia    Hypertension    PONV (postoperative nausea and vomiting)    Sleep apnea    no Cpap   Subdural hematoma, 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   fell off truck hit head on concrete   Past Surgical History:  Procedure Laterality Date   APPLICATION OF CRANIAL NAVIGATION Left 04/29/2020   Procedure: [place];  Surgeon: Judith Part, MD;  Location: [place];  Service: Neurosurgery;  Laterality: Left;   arthroscopic knee     COLONOSCOPY N/A 11/29/2018   Procedure: COLONOSCOPY;  Surgeon: Rogene Houston, MD;  [facility];  Service: Endoscopy;  Laterality: N/A;  730-rescheduled 9/2 same time per Ann    CRANIOTOMY Left 04/29/2020   Procedure: LEFT CRANIECTOMY WITH TUMOR EXCISION;  Surgeon: Judith Part, MD;  Location: [place];  Service: Neurosurgery;  Laterality: Left;   KNEE SURGERY Right    NASAL SEPTOPLASTY W/ TURBINOPLASTY Bilateral 03/19/2020   Procedure: NASAL SEPTOPLASTY WITH BILATERAL  TURBINATE REDUCTION;  Surgeon: Leta Baptist, MD;  Location: [place];  Service: ENT;  Laterality: Bilateral;   VASECTOMY     Patient Active Problem List   Diagnosis Date Noted   Seizure ([place]) 08/13/2020   Seizures ([place]) 08/12/2020   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   DVT (deep venous thrombosis) ([place]) 08/12/2020   COVID-19 virus infection 08/12/2020   Acute lower UTI 07/14/2020   Delirium 07/06/2020   Sleep apnea 07/06/2020   GERD (gastroesophageal reflux disease) 07/06/2020   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[place]) 06/25/2020   Sundowning 06/25/2020   Slow transit constipation    Essential hypertension    Hypokalemia    Postoperative pain    Meningioma ([place]) 05/02/2020   Brain tumor ([place]) 04/28/2020   S/P nasal septoplasty 03/19/2020   Special screening for malignant neoplasms, colon 01/09/2018    ONSET DATE: 02/19/2022  REFERRING DIAG: D32.9 (ICD-10-CM) - Meningioma ([place]) G82.50 (ICD-10-CM) -  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[place])  THERAPY DIAG:  Muscle weakness (generalized)  Unsteadiness on feet  Other abnormalities of gait and mobility  Abnormal posture  Meningioma (HCC)  Rationale for Evaluation and Treatment: Rehabilitation  SUBJECTIVE:                                                                                                                                                                                             SUBJECTIVE STATEMENT: Pt reports no falls or acute changes since last session. Pt reports no pain today. Pt reports he has tried his new HEP at least once since last visit, wasn't sore afterwards so encouraged him to try and increase # of days he performs HEP. He reports he did try the exercises  from sitting EOB as well as from sitting in [place].  Pt accompanied by: family member Pamala Hurry (sister in Sports [employer])  PERTINENT HISTORY: anxiety, OA, cancer, subdural hematoma   PAIN:  Are you having pain? Yes: NPRS scale: 6/10 Pain location: Buttocks Pain description: soreness Aggravating factors: coughing Relieving factors: repositioning  PRECAUTIONS: Fall  WEIGHT BEARING RESTRICTIONS: No  FALLS: Has patient fallen in last 6 months? 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 There are mild chronic small-vessel ischemic changes of the white matter. There is atrophy, encephalomalacia and gliosis along the medial aspects of both sides of the brain at the frontoparietal vertex, sequela of the previous tumor and subsequent resection. It appears that the midportion of the superior sagittal sinus has been resected. No evidence of tumor in the venous sinuses anterior or posterior to that.   From MRI on 06/07/2021: Redemonstrated sequela of prior vertex craniotomy/cranioplasty and parafalcine meningioma resection with partial resection of the superior sagittal sinus. No evidence of residual/recurrent tumor at the resection site.   Chronic intracranial findings without interval change, as described.   Paranasal sinus disease, as outlined.  COGNITION: Overall cognitive status: Impaired   SENSATION: Light touch: WFL  EDEMA:  Pitting edema in BLE, has been this way x 2 years, on fluid pills  POSTURE: posterior pelvic tilt   LOWER EXTREMITY MMT:    MMT  Right Eval Left Eval  Hip flexion 2- 2-  Hip extension    Hip  abduction    Hip adduction    Hip internal rotation    Hip external rotation    Knee flexion 2- 5  Knee extension 2- 5  Ankle dorsiflexion 0 0  Ankle plantarflexion    Ankle inversion    Ankle eversion    (Blank rows = not tested)  BED MOBILITY: per family report  Sit to supine Total A Supine to sit Total A Rolling to Right Total A Rolling to Left Total A  TODAY'S TREATMENT:   THER ACT: Pt received seated in PWC. Sit to stand to stedy with mod A from w/c, heavy UE reliance. Pt able to tolerate standing x 1 min with cues for upright posture with trunk and hip extension. Stedy transfer to mat table. Sit to supine mod A needed for BLE management.  Supine to L sidelying (rolling to the L) with focus on initiating transfer with R shoulder and UE, progresses from mod A needed to roll to from fully supine to partial SL to min A needed for partial SL to full SL. Supine to R sidelying (rolling to the R) with focus on initiating transfer with L shoulder and UE, progresses from max A needed to roll from fully supine to partial SL to only CGA needed for partial SL to full SL.  Supine to SL to sit with assist x 2 for trunk control coming up from R side (weaker side). Per pt report he usually transfers up from his L side in bed. Bed mobility was simulated in this manner due to pt initially reporting he transferred from R side.  Sit to stand to stedy with mod A from elevated mat table. Stedy transfer back to [facility] at end of session.  PATIENT EDUCATION: Education details: continue HEP Person educated: Patient and sister in [employer] copy[employer] Education method: Explanation Education comprehension: verbalized understanding and needs further education  HOME EXERCISE PROGRAM: Access code: [email or web address] (from previous bout of therapy)   Verbally added 03/05/22: Seated L/R lateral leans from [place] x 5 reps onto level surface Seated mini-crunches from  semi-reclined PWC or recliner x 10 reps  GOALS: Goals reviewed with patient? Yes  SHORT TERM GOALS: Target date: 04/07/2022    Pt and family will be independent with initial review of HEP for improved strength, balance, and transfers. Baseline: Goal status: INITIAL  2.  Pt will demonstrate ability to roll L and R with max A x 1 to decrease caregiver burden Baseline: total A Goal status: INITIAL  3.  Pt will perform slide board transfer with total A in clinic setting Baseline: unsafe to perform at home Goal status: INITIAL  4.  Pt will perform sit to stand with LRAD and max A x 1 Baseline: not assessed at eval Goal status: INITIAL   LONG TERM GOALS: Target date: 05/18/2022   Pt and family will be independent with final HEP for improved strength, balance, and transfers. Baseline:  Goal status: INITIAL  2.  Pt will demonstrate ability to roll L and R with mod A x 1 to decrease caregiver burden Baseline: total A Goal status: INITIAL  3.  Pt will perform SB transfer with max A x 1 to demonstrate improved functional mobility and decrease caregiver burden Baseline:  Goal status: INITIAL  4.  Pt will tolerate standing x 5 min while performing functional task to demonstrate improved functional mobility Baseline: not assessed at eval Goal status: INITIAL  5.  Pt will perform sit to stand with mod A x 1  to demonstrate improved functional mobility and decreased caregiver burden Baseline: not assessed at eval Goal status: INITIAL  6.  Pt will perform stand pivot transfer with RW and mod A x 1 to demonstrate improved functional mobility Baseline: not assessed at eval Goal status: INITIAL  ASSESSMENT:  CLINICAL IMPRESSION: Emphasis of skilled PT session on working on supine to/from sit and rolling L/R on flat mat table. Pt exhibits improved ability to initiating rolling L/R as session progresses. Pt does continue to exhibit significant difficulty with supine to/from sit  transfer, especially from his R side due to weakness. Pt also continues to rely heavily on his BUE to pull himself into standing with use of stedy. Pt continues to benefit from skilled therapy services to work towards [place]. Continue POC.   OBJECTIVE IMPAIRMENTS: decreased activity tolerance, decreased balance, decreased cognition, decreased coordination, decreased endurance, decreased mobility, difficulty walking, decreased ROM, decreased strength, decreased safety awareness, increased edema, and impaired flexibility.   ACTIVITY LIMITATIONS: sitting, standing, transfers, bed mobility, and continence  PARTICIPATION LIMITATIONS: driving and community activity  PERSONAL FACTORS: Age, Behavior pattern, Time since onset of injury/illness/exacerbation, and 3+ comorbidities:    anxiety, OA, cancer, subdural hematoma are also affecting patient's functional outcome.   REHAB POTENTIAL: Fair time since onset of diagnosis; poor pt initiation and motivation  CLINICAL DECISION MAKING: Stable/uncomplicated  EVALUATION COMPLEXITY: Low  PLAN:  PT FREQUENCY: 1-2x/week  PT DURATION: 12 weeks  PLANNED INTERVENTIONS: Therapeutic exercises, Therapeutic activity, Neuromuscular re-education, Balance training, Gait training, Patient/Family education, Self Care, Joint mobilization, Vestibular training, Canalith repositioning, Visual/preceptual remediation/compensation, Orthotic/Fit training, DME instructions, Dry Needling, Cognitive remediation, Wheelchair mobility training, Spinal mobilization, Cryotherapy, Moist heat, Taping, Manual therapy, and Re-evaluation  PLAN FOR NEXT SESSION: review pressure relief techniques, assess standing to stedy, // bars, bari RW if safe and able, work on SB transfers, work on bed mobility (rolling, supine to/from sit), how is HEP going?   Excell Seltzer, PT, DPT, CSRS 03/10/2022, 12:29 PM

## 2022-03-12 ENCOUNTER — Ambulatory Visit: Payer: Medicare Other | Admitting: Physical Therapy

## 2022-03-12 DIAGNOSIS — D329 Benign neoplasm of meninges, unspecified: Secondary | ICD-10-CM

## 2022-03-12 DIAGNOSIS — R2681 Unsteadiness on feet: Secondary | ICD-10-CM

## 2022-03-12 DIAGNOSIS — R2689 Other abnormalities of gait and mobility: Secondary | ICD-10-CM

## 2022-03-12 DIAGNOSIS — R293 Abnormal posture: Secondary | ICD-10-CM

## 2022-03-12 DIAGNOSIS — M6281 Muscle weakness (generalized): Secondary | ICD-10-CM

## 2022-03-12 NOTE — Therapy (Signed)
OUTPATIENT PHYSICAL THERAPY NEURO TREATMENT   Patient Name: Anthony Downs MRN: [id number] DOB:10/19/1950, 72 y.o., male Today's Date: 03/12/2022   PCP: Redmond School, MD REFERRING PROVIDER: [name], MD  END OF SESSION:  PT End of Session - 03/12/22 1323     Visit Number 5    Number of Visits 25   with eval   Date for PT Re-Evaluation 05/18/22    Authorization Type Medicare and UHC (needs 10th visit progress note)    Authorization - Number of Visits 30   30 VL   Progress Note Due on Visit 10    PT Start Time 1315    PT Stop Time 1400    PT Time Calculation (min) 45 min    Equipment Utilized During Treatment Gait belt    Activity Tolerance Patient tolerated treatment well;Other (comment)   onset of nausea at end of session   Behavior During Therapy [facility] for tasks assessed/performed                 Past Medical History:  Diagnosis Date   Acid reflux    Anxiety    Arthritis    Asthma    as a child   Basal cell carcinoma 01/26/2021   RIGHT POST SURICULAR INFERIOR   Basal cell carcinoma 01/26/2021   RIGHT POST AURICULAR SUPERIOR   Benign brain tumor ([place])    Cancer ([place])    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, squamous behind ear   Complication of anesthesia    slow to wake up and pain medicines make him sick   GERD (gastroesophageal reflux disease)    Hypercholesteremia    Hypertension    PONV (postoperative nausea and vomiting)    Sleep apnea    no Cpap   Subdural hematoma, 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   fell off truck hit head on concrete   Past Surgical History:  Procedure Laterality Date   APPLICATION OF CRANIAL NAVIGATION Left 04/29/2020   Procedure: [place];  Surgeon: Judith Part, MD;  Location: [place];  Service: Neurosurgery;  Laterality: Left;   arthroscopic knee     COLONOSCOPY N/A 11/29/2018   Procedure: COLONOSCOPY;  Surgeon: Rogene Houston, MD;  [facility];  Service: Endoscopy;   Laterality: N/A;  730-rescheduled 9/2 same time per Ann   CRANIOTOMY Left 04/29/2020   Procedure: LEFT CRANIECTOMY WITH TUMOR EXCISION;  Surgeon: Judith Part, MD;  Location: [place];  Service: Neurosurgery;  Laterality: Left;   KNEE SURGERY Right    NASAL SEPTOPLASTY W/ TURBINOPLASTY Bilateral 03/19/2020   Procedure: NASAL SEPTOPLASTY WITH BILATERAL  TURBINATE REDUCTION;  Surgeon: Leta Baptist, MD;  Location: [place];  Service: ENT;  Laterality: Bilateral;   VASECTOMY     Patient Active Problem List   Diagnosis Date Noted   Seizure ([place]) 08/13/2020   Seizures ([place]) 08/12/2020   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   DVT (deep venous thrombosis) ([place]) 08/12/2020   COVID-19 virus infection 08/12/2020   Acute lower UTI 07/14/2020   Delirium 07/06/2020   Sleep apnea 07/06/2020   GERD (gastroesophageal reflux disease) 07/06/2020   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[place]) 06/25/2020   Sundowning 06/25/2020   Slow transit constipation    Essential hypertension    Hypokalemia    Postoperative pain    Meningioma ([place]) 05/02/2020   Brain tumor ([place]) 04/28/2020   S/P nasal septoplasty 03/19/2020   Special screening for malignant neoplasms, colon 01/09/2018    ONSET DATE: 02/19/2022  REFERRING DIAG: D32.9 (ICD-10-CM) - Meningioma ([place]) G82.50 (ICD-10-CM) - 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[place])  THERAPY DIAG:  Muscle weakness (generalized)  Unsteadiness on feet  Other abnormalities of gait and mobility  Abnormal posture  Meningioma (HCC)  Rationale for Evaluation and Treatment: Rehabilitation  SUBJECTIVE:                                                                                                                                                                                             SUBJECTIVE STATEMENT: Pt's wife reports he is standing up straighter from EOB with less cues needed. No acute changes since last visit.  Pt accompanied by: family member wife Anthony Downs  PERTINENT HISTORY: anxiety, OA, cancer, subdural  hematoma   PAIN:  Are you having pain? Yes: NPRS scale: 6/10 Pain location: Buttocks Pain description: soreness Aggravating factors: coughing Relieving factors: repositioning  PRECAUTIONS: Fall  WEIGHT BEARING RESTRICTIONS: No  FALLS: Has patient fallen in last 6 months? 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 There are mild chronic small-vessel ischemic changes of the white matter. There is atrophy, encephalomalacia and gliosis along the medial aspects of both sides of the brain at the frontoparietal vertex, sequela of the previous tumor and subsequent resection. It appears that the midportion of the superior sagittal sinus has been resected. No evidence of tumor in the venous sinuses anterior or posterior to that.   From MRI on 06/07/2021: Redemonstrated sequela of prior vertex craniotomy/cranioplasty and parafalcine meningioma resection with partial resection of the superior sagittal sinus. No evidence of residual/recurrent tumor at the resection site.   Chronic intracranial findings without interval change, as described.   Paranasal sinus disease, as outlined.  COGNITION: Overall cognitive status: Impaired   SENSATION: Light touch: WFL  EDEMA:  Pitting edema in BLE, has been this way x 2 years, on fluid pills  POSTURE: posterior pelvic tilt   LOWER EXTREMITY MMT:    MMT Right Eval Left Eval  Hip flexion 2- 2-  Hip extension    Hip abduction    Hip adduction    Hip internal rotation    Hip external rotation    Knee flexion 2- 5   Knee extension 2- 5  Ankle dorsiflexion 0  0  Ankle plantarflexion    Ankle inversion    Ankle eversion    (Blank rows = not tested)  BED MOBILITY: per family report  Sit to supine Total A Supine to sit Total A Rolling to Right Total A Rolling to Left Total A  TODAY'S TREATMENT:   NMR: Sit to stand in stedy, SBA to stand from [facility] but heavy UE reliance. Sit to stand from perched position with SBA, focus on decreasing UE support in standing (alt high fives). Stedy transfer to mat table. Sit to stand x 3 reps to RW pushing with LUE from mat table with CGA x 2 but decreased physical assist needed to stand as compared to previous attempts. Focus on upright stance in standing with RW. Pt able to take a sidestep to the L with LLE, unable to sidestep with RLE due to difficulty lifting limb.  Stedy transfer back to [facility].  Pt has onset of nausea once seated in PWC at end of session due to exertion from session. Provided emesis bag and cool cloth to forehead to patient. Pt left in care of wife at end of session.  PATIENT EDUCATION: Education details: continue HEP Person educated: Patient and wife Anthony Downs Education method: Explanation Education comprehension: verbalized understanding and needs further education  HOME EXERCISE PROGRAM: Access code: [email or web address] (from previous bout of therapy)   Verbally added 03/05/22: Seated L/R lateral leans from [place] x 5 reps onto level surface Seated mini-crunches from semi-reclined PWC or recliner x 10 reps  GOALS: Goals reviewed with patient? Yes  SHORT TERM GOALS: Target date: 04/07/2022    Pt and family will be independent with initial review of HEP for improved strength, balance, and transfers. Baseline: Goal status: INITIAL  2.  Pt will demonstrate ability to roll L and R with max A x 1 to decrease caregiver burden Baseline: total A Goal status: INITIAL  3.  Pt will perform slide board transfer with total A in clinic setting Baseline: unsafe to  perform at home Goal status: INITIAL  4.  Pt will perform sit to stand with LRAD and max A x 1 Baseline: not assessed at eval Goal status: INITIAL   LONG TERM GOALS: Target date: 05/18/2022   Pt and family will be independent with final HEP for improved strength, balance, and transfers. Baseline:  Goal status: INITIAL  2.  Pt will demonstrate ability to roll L and R with mod A x 1 to decrease caregiver burden Baseline: total A Goal status: INITIAL  3.  Pt will perform SB transfer with max A x 1 to demonstrate improved functional mobility and decrease caregiver burden Baseline:  Goal status: INITIAL  4.  Pt will tolerate standing x 5 min while performing functional task to demonstrate improved functional mobility Baseline: not assessed at eval Goal status: INITIAL  5.  Pt will perform sit to stand with mod A x 1 to demonstrate improved functional mobility and decreased caregiver burden Baseline: not assessed at eval Goal status: INITIAL  6.  Pt will perform stand pivot transfer with RW and mod A x 1 to demonstrate improved functional mobility Baseline: not assessed at eval Goal status: INITIAL  ASSESSMENT:  CLINICAL IMPRESSION: Emphasis of skilled PT session on working on sit to stands with decreased UE reliance and working on upright posture. Pt continues to rely heavily on his BUE to pull himself into standing with use of stedy but exhibits improved ability to stand to RW pushing up with one UE from  mat table with decreased physical assist needed. Pt continues to benefit from skilled therapy services to work towards [place]. Continue POC.   OBJECTIVE IMPAIRMENTS: decreased activity tolerance, decreased balance, decreased cognition, decreased coordination, decreased endurance, decreased mobility, difficulty walking, decreased ROM, decreased strength, decreased safety awareness, increased edema, and impaired flexibility.   ACTIVITY LIMITATIONS: sitting, standing, transfers, bed  mobility, and continence  PARTICIPATION LIMITATIONS: driving and community activity  PERSONAL FACTORS: Age, Behavior pattern, Time since onset of injury/illness/exacerbation, and 3+ comorbidities:    anxiety, OA, cancer, subdural hematoma are also affecting patient's functional outcome.   REHAB POTENTIAL: Fair time since onset of diagnosis; poor pt initiation and motivation  CLINICAL DECISION MAKING: Stable/uncomplicated  EVALUATION COMPLEXITY: Low  PLAN:  PT FREQUENCY: 1-2x/week  PT DURATION: 12 weeks  PLANNED INTERVENTIONS: Therapeutic exercises, Therapeutic activity, Neuromuscular re-education, Balance training, Gait training, Patient/Family education, Self Care, Joint mobilization, Vestibular training, Canalith repositioning, Visual/preceptual remediation/compensation, Orthotic/Fit training, DME instructions, Dry Needling, Cognitive remediation, Wheelchair mobility training, Spinal mobilization, Cryotherapy, Moist heat, Taping, Manual therapy, and Re-evaluation  PLAN FOR NEXT SESSION: review pressure relief techniques, standing to bari RW trying to take sidesteps, work on sit to stand to stedy with decreased UE support, work on standing to [employer] with Anthony Downs assisting so pt gets more comfortable letting her assist him with this at home, work on SB transfers, work on bed mobility (rolling, supine to/from sit), how is HEP going?   Excell Seltzer, PT, DPT, CSRS 03/12/2022, 2:06 PM

## 2022-03-17 ENCOUNTER — Ambulatory Visit: Payer: Medicare Other | Admitting: Physical Therapy

## 2022-03-17 DIAGNOSIS — D329 Benign neoplasm of meninges, unspecified: Secondary | ICD-10-CM

## 2022-03-17 DIAGNOSIS — M6281 Muscle weakness (generalized): Secondary | ICD-10-CM | POA: Diagnosis not present

## 2022-03-17 DIAGNOSIS — R293 Abnormal posture: Secondary | ICD-10-CM

## 2022-03-17 DIAGNOSIS — R2689 Other abnormalities of gait and mobility: Secondary | ICD-10-CM

## 2022-03-17 DIAGNOSIS — R2681 Unsteadiness on feet: Secondary | ICD-10-CM

## 2022-03-17 NOTE — Therapy (Signed)
OUTPATIENT PHYSICAL THERAPY NEURO TREATMENT   Patient Name: Anthony Downs MRN: [id number] DOB:[birth year], 72 y.o., male Today's Date: 03/17/2022   PCP: Anthony School, MD REFERRING PROVIDER: [name], MD  END OF SESSION:  PT End of Session - 03/17/22 1412     Visit Number 6    Number of Visits 25   with eval   Date for PT Re-Evaluation 05/18/22    Authorization Type Medicare and UHC (needs 10th visit progress note)    Authorization - Number of Visits 30   30 VL   Progress Note Due on Visit 10    PT Start Time 1410   this therapist running late with previous patient   PT Stop Time 1448    PT Time Calculation (min) 38 min    Equipment Utilized During Treatment Gait belt    Activity Tolerance Patient tolerated treatment well    Behavior During Therapy WFL for tasks assessed/performed                  Past Medical History:  Diagnosis Date   Acid reflux    Anxiety    Arthritis    Asthma    as a child   Basal cell carcinoma 01/26/2021   RIGHT POST SURICULAR INFERIOR   Basal cell carcinoma 01/26/2021   RIGHT POST AURICULAR SUPERIOR   Benign brain tumor ([name])    Cancer ([name])    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, squamous behind ear   Complication of anesthesia    slow to wake up and pain medicines make him sick   GERD (gastroesophageal reflux disease)    Hypercholesteremia    Hypertension    PONV (postoperative nausea and vomiting)    Sleep apnea    no Cpap   Subdural hematoma, post-traumatic ([name]) 2008   fell off truck hit head on concrete   Past Surgical History:  Procedure Laterality Date   APPLICATION OF CRANIAL NAVIGATION Left 04/29/2020   Procedure: [place];  Surgeon: Anthony Part, MD;  Location: [facility];  Service: Neurosurgery;  Laterality: Left;   arthroscopic knee     COLONOSCOPY N/A 11/29/2018   Procedure: COLONOSCOPY;  Surgeon: Anthony Houston, MD;  [facility];  Service: Endoscopy;   Laterality: N/A;  730-rescheduled 9/2 same time per Ann   CRANIOTOMY Left 04/29/2020   Procedure: LEFT CRANIECTOMY WITH TUMOR EXCISION;  Surgeon: Anthony Part, MD;  Location: [facility];  Service: Neurosurgery;  Laterality: Left;   KNEE SURGERY Right    NASAL SEPTOPLASTY W/ TURBINOPLASTY Bilateral 03/19/2020   Procedure: NASAL SEPTOPLASTY WITH BILATERAL  TURBINATE REDUCTION;  Surgeon: Anthony Baptist, MD;  Location: [facility];  Service: ENT;  Laterality: Bilateral;   VASECTOMY     Patient Active Problem List   Diagnosis Date Noted   Seizure ([name]) 08/13/2020   Seizures ([name]) 08/12/2020   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   DVT (deep venous thrombosis) ([name]) 08/12/2020   COVID-19 virus infection 08/12/2020   Acute lower UTI 07/14/2020   Delirium 07/06/2020   Sleep apnea 07/06/2020   GERD (gastroesophageal reflux disease) 07/06/2020   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[name]) 06/25/2020   Sundowning 06/25/2020   Slow transit constipation    Essential hypertension    Hypokalemia    Postoperative pain    Meningioma ([name]) 05/02/2020   Brain tumor ([name]) 04/28/2020   S/P nasal septoplasty 03/19/2020   Special screening for malignant neoplasms, colon 01/09/2018    ONSET DATE: 02/19/2022  REFERRING DIAG: D32.9 (ICD-10-CM) - Meningioma ([name]) G82.50 (ICD-10-CM) - 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[name])  THERAPY DIAG:  Muscle weakness (generalized)  Unsteadiness on feet  Other abnormalities of gait and mobility  Abnormal posture  Meningioma (H[facility])  Rationale for Evaluation and Treatment: Rehabilitation  SUBJECTIVE:                                                                                                                                                                                             SUBJECTIVE STATEMENT: Pt received seated in PWC, no complaints of pain this date and reports no acute changes since last visit.  Pt accompanied by: family member wife [name]  PERTINENT HISTORY: anxiety, OA, cancer, subdural hematoma    PAIN:  Are you having pain? Yes: NPRS scale: 6/10 Pain location: Buttocks Pain description: soreness Aggravating factors: coughing Relieving factors: repositioning  PRECAUTIONS: Fall  WEIGHT BEARING RESTRICTIONS: No  FALLS: Has patient fallen in last 6 months? 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 There are mild chronic small-vessel ischemic changes of the white matter. There is atrophy, encephalomalacia and gliosis along the medial aspects of both sides of the brain at the frontoparietal vertex, sequela of the previous tumor and subsequent resection. It appears that the midportion of the superior sagittal sinus has been resected. No evidence of tumor in the venous sinuses anterior or posterior to that.   From MRI on 06/07/2021: Redemonstrated sequela of prior vertex craniotomy/cranioplasty and parafalcine meningioma resection with partial resection of the superior sagittal sinus. No evidence of residual/recurrent tumor at the resection site.   Chronic intracranial findings without interval change, as described.   Paranasal sinus disease, as outlined.   LOWER EXTREMITY MMT:    MMT Right Eval Left Eval  Hip flexion 2- 2-  Hip extension    Hip abduction    Hip adduction    Hip internal rotation    Hip external rotation    Knee flexion 2- 5  Knee extension 2- 5  Ankle dorsiflexion 0 0  Ankle plantarflexion    Ankle inversion    Ankle eversion    (Blank rows = not tested)    TODAY'S TREATMENT:   NMR: Pt received seated in PWC. Sit  to stand with CGA from [place] to stedy, stedy transfer to mat table. Sit to stand from elevated mat to bariatric RW x 3 reps with CGA x 1. Pt continues to exhibit increased independence with sit to stand transfers. Pt currently unable to work on sit to stands to RW at home as his wife is awaiting a shoulder MRI and might possibly need surgery, he does not feel safe working on standing to RW when she is unable to assist him with this. Pt is willing to work on sit to stands to [place] at home if another person to assist is present. Pt able to tolerate standing up to 3 min with min cueing for upright posture with hip and trunk extension. Attempt to perform sit to stand to stedy at end of session pushing with one UE from mat table as pt did with sit to stands to RW, pt unable to due to fatigue and anteriorly placed position of BLE due to stedy. Pt able to stand to stedy with CGA with BUE pulling from bars of stedy. Stedy transfer back to [facility].  PATIENT EDUCATION: Education details: continue HEP, keep working on standing to RW if safe and able otherwise stedy with decreased UE support Person educated: Patient and wife [name] Education method: Explanation Education comprehension: verbalized understanding and needs further education  HOME EXERCISE PROGRAM: Access code: [email or web address] (from previous bout of therapy)   Verbally added 03/05/22: Seated L/R lateral leans from [place] x 5 reps onto level surface Seated mini-crunches from semi-reclined PWC or recliner x 10 reps  GOALS: Goals reviewed with patient? Yes  SHORT TERM GOALS: Target date: 04/07/2022   Pt and family will be independent with initial review of HEP for improved strength, balance, and transfers. Baseline: Goal status: INITIAL  2.  Pt will demonstrate ability to roll L and R with max A x 1 to decrease caregiver burden Baseline: total A Goal status: INITIAL  3.  Pt will perform slide board transfer with total A in clinic setting Baseline: unsafe to  perform at home Goal status: INITIAL  4.  Pt will perform sit to stand with LRAD and max A x 1 Baseline: not assessed at eval Goal status: INITIAL   LONG TERM GOALS: Target date: 05/18/2022   Pt and family will be independent with final HEP for improved strength, balance, and transfers. Baseline:  Goal status: INITIAL  2.  Pt will demonstrate ability to roll L and R with mod A x 1 to decrease caregiver burden Baseline: total A Goal status: INITIAL  3.  Pt will perform SB transfer with max A x 1 to demonstrate improved functional mobility and decrease caregiver burden Baseline:  Goal status: INITIAL  4.  Pt will tolerate standing x 5 min while performing functional task to demonstrate improved functional mobility Baseline: not assessed at eval Goal status: INITIAL  5.  Pt will perform sit to stand with mod A x 1 to demonstrate improved functional mobility and decreased caregiver burden Baseline: not assessed at eval Goal status: INITIAL  6.  Pt will perform stand pivot transfer with RW and mod A x 1 to demonstrate improved functional mobility Baseline: not assessed at eval Goal status: INITIAL  ASSESSMENT:  CLINICAL IMPRESSION: Emphasis of skilled PT session on continuing to work on sit to stands with decreased UE support and in a manner that pt can safely continue to practice with at home. Pt continues to exhibit increased independence with sit to stands to RW, is  able to perform this session with cGA and pushing with LUE from mat table. However, pt exhibits poor ability to translate this to sit to stands to stedy and continues to rely on pulling with BUE to achieve stance. Pt also limited with his ability to safely practice sit to stands with RW at home due to decreased caregiver support due to physical limitations. Pt continues to benefit from skilled therapy services to address ongoing BLE weakness leading to decreased safety and independence with functional mobility. Continue  POC.   OBJECTIVE IMPAIRMENTS: decreased activity tolerance, decreased balance, decreased cognition, decreased coordination, decreased endurance, decreased mobility, difficulty walking, decreased ROM, decreased strength, decreased safety awareness, increased edema, and impaired flexibility.   ACTIVITY LIMITATIONS: sitting, standing, transfers, bed mobility, and continence  PARTICIPATION LIMITATIONS: driving and community activity  PERSONAL FACTORS: Age, Behavior pattern, Time since onset of injury/illness/exacerbation, and 3+ comorbidities:    anxiety, OA, cancer, subdural hematoma are also affecting patient's functional outcome.   REHAB POTENTIAL: Fair time since onset of diagnosis; poor pt initiation and motivation  CLINICAL DECISION MAKING: Stable/uncomplicated  EVALUATION COMPLEXITY: Low  PLAN:  PT FREQUENCY: 1-2x/week  PT DURATION: 12 weeks  PLANNED INTERVENTIONS: Therapeutic exercises, Therapeutic activity, Neuromuscular re-education, Balance training, Gait training, Patient/Family education, Self Care, Joint mobilization, Vestibular training, Canalith repositioning, Visual/preceptual remediation/compensation, Orthotic/Fit training, DME instructions, Dry Needling, Cognitive remediation, Wheelchair mobility training, Spinal mobilization, Cryotherapy, Moist heat, Taping, Manual therapy, and Re-evaluation  PLAN FOR NEXT SESSION: review pressure relief techniques, standing to bari RW trying to take sidesteps, work on sit to stand to stedy with decreased UE support, work on standing to [employer], work on [employer] transfers, work on bed mobility (rolling, supine to/from sit), how is HEP going?   Excell Seltzer, PT, DPT, CSRS 03/17/2022, 2:50 PM

## 2022-03-18 ENCOUNTER — Encounter: Payer: Self-pay | Admitting: Radiology

## 2022-03-19 ENCOUNTER — Ambulatory Visit: Payer: Medicare Other | Attending: Physical Medicine & Rehabilitation | Admitting: Physical Therapy

## 2022-03-19 DIAGNOSIS — R2681 Unsteadiness on feet: Secondary | ICD-10-CM | POA: Insufficient documentation

## 2022-03-19 DIAGNOSIS — R293 Abnormal posture: Secondary | ICD-10-CM | POA: Insufficient documentation

## 2022-03-19 DIAGNOSIS — D329 Benign neoplasm of meninges, unspecified: Secondary | ICD-10-CM | POA: Insufficient documentation

## 2022-03-19 DIAGNOSIS — R2689 Other abnormalities of gait and mobility: Secondary | ICD-10-CM | POA: Insufficient documentation

## 2022-03-19 DIAGNOSIS — M6281 Muscle weakness (generalized): Secondary | ICD-10-CM | POA: Diagnosis present

## 2022-03-19 NOTE — Therapy (Signed)
OUTPATIENT PHYSICAL THERAPY NEURO TREATMENT   Patient Name: Anthony Downs MRN: [id number] DOB:May 14, 1950, 72 y.o., male Today's Date: 03/19/2022   PCP: Redmond School, MD REFERRING PROVIDER: [name], MD  END OF SESSION:  PT End of Session - 03/19/22 1405     Visit Number 7    Number of Visits 25   with eval   Date for PT Re-Evaluation 05/18/22    Authorization Type Medicare and UHC (needs 10th visit progress note)    Authorization - Number of Visits 30   30 VL   Progress Note Due on Visit 10    PT Start Time 1405    PT Stop Time 1448    PT Time Calculation (min) 43 min    Equipment Utilized During Treatment Gait belt    Activity Tolerance Patient tolerated treatment well    Behavior During Therapy WFL for tasks assessed/performed                   Past Medical History:  Diagnosis Date   Acid reflux    Anxiety    Arthritis    Asthma    as a child   Basal cell carcinoma 01/26/2021   RIGHT POST SURICULAR INFERIOR   Basal cell carcinoma 01/26/2021   RIGHT POST AURICULAR SUPERIOR   Benign brain tumor ([place])    Cancer ([place])    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, squamous behind ear   Complication of anesthesia    slow to wake up and pain medicines make him sick   GERD (gastroesophageal reflux disease)    Hypercholesteremia    Hypertension    PONV (postoperative nausea and vomiting)    Sleep apnea    no Cpap   Subdural hematoma, 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   fell off truck hit head on concrete   Past Surgical History:  Procedure Laterality Date   APPLICATION OF CRANIAL NAVIGATION Left 04/29/2020   Procedure: [place];  Surgeon: Judith Part, MD;  Location: [facility];  Service: Neurosurgery;  Laterality: Left;   arthroscopic knee     COLONOSCOPY N/A 11/29/2018   Procedure: COLONOSCOPY;  Surgeon: Rogene Houston, MD;  [facility];  Service: Endoscopy;  Laterality: N/A;  730-rescheduled 9/2 same time per  Ann   CRANIOTOMY Left 04/29/2020   Procedure: LEFT CRANIECTOMY WITH TUMOR EXCISION;  Surgeon: Judith Part, MD;  Location: [facility];  Service: Neurosurgery;  Laterality: Left;   KNEE SURGERY Right    NASAL SEPTOPLASTY W/ TURBINOPLASTY Bilateral 03/19/2020   Procedure: NASAL SEPTOPLASTY WITH BILATERAL  TURBINATE REDUCTION;  Surgeon: Leta Baptist, MD;  Location: [facility];  Service: ENT;  Laterality: Bilateral;   VASECTOMY     Patient Active Problem List   Diagnosis Date Noted   Seizure (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   DVT (deep venous thrombosis) ([place]) 08/12/2020   COVID-19 virus infection 08/12/2020   Acute lower UTI 07/14/2020   Delirium 07/06/2020   Sleep apnea 07/06/2020   GERD (gastroesophageal reflux disease) 07/06/2020   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[place]) 06/25/2020   Sundowning 06/25/2020   Slow transit constipation    Essential hypertension    Hypokalemia    Postoperative pain    Meningioma ([place]) 05/02/2020   Brain tumor ([place]) 04/28/2020   S/P nasal septoplasty 03/19/2020   Special screening for malignant neoplasms, colon 01/09/2018    ONSET DATE: 02/19/2022  REFERRING DIAG: D32.9 (ICD-10-CM) - Meningioma ([place])  G82.50 (ICD-10-CM) - 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[place])  THERAPY DIAG:  Muscle weakness (generalized)  Unsteadiness on feet  Other abnormalities of gait and mobility  Abnormal posture  Meningioma (HCC)  Rationale for Evaluation and Treatment: Rehabilitation  SUBJECTIVE:                                                                                                                                                                                             SUBJECTIVE STATEMENT: Pt reports no acute changes since last session. Pt with ongoing pain in L rib area that only hurts when he coughs, no pain at rest.  Pt accompanied by: family member wife Anthony Downs  PERTINENT HISTORY: anxiety, OA, cancer, subdural hematoma   PAIN:  Are you having  pain? Yes: NPRS scale: 6/10 Pain location: Buttocks Pain description: soreness Aggravating factors: coughing Relieving factors: repositioning  PRECAUTIONS: Fall  WEIGHT BEARING RESTRICTIONS: No  FALLS: Has patient fallen in last 6 months? 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 There are mild chronic small-vessel ischemic changes of the white matter. There is atrophy, encephalomalacia and gliosis along the medial aspects of both sides of the brain at the frontoparietal vertex, sequela of the previous tumor and subsequent resection. It appears that the midportion of the superior sagittal sinus has been resected. No evidence of tumor in the venous sinuses anterior or posterior to that.   From MRI on 06/07/2021: Redemonstrated sequela of prior vertex craniotomy/cranioplasty and parafalcine meningioma resection with partial resection of the superior sagittal sinus. No evidence of residual/recurrent tumor at the resection site.   Chronic intracranial findings without interval change, as described.   Paranasal sinus disease, as outlined.   LOWER EXTREMITY MMT:    MMT Right Eval Left Eval  Hip flexion 2- 2-  Hip extension    Hip abduction    Hip adduction    Hip internal rotation    Hip external rotation    Knee flexion 2- 5  Knee extension 2- 5  Ankle dorsiflexion 0 0  Ankle plantarflexion    Ankle inversion    Ankle eversion    (Blank rows = not tested)    TODAY'S TREATMENT:   NMR: Sit to stand with SBA from  PWC to stedy, stedy transfer to  mat table. Attempted to work on sit to stands from elevated mat table to stedy with pt pushing up with LUE from mat table, RUE on bar of stedy. Pt is able to stand about halfway x 1 repetition before needing to use BUE support pulling from stedy to stand. Pt then has difficulty standing to stedy even pulling with BUE and difficulty maintaining upright posture. Pt does appear more fatigued this date. Transitioned to working on sit to stands to [employer] again as pt has had more success with this in previous sessions. After several attempts pt with needing to pull up on RW with BUE to stand he is able to progress to standing x 2 reps pushing up with LUE from mat able. All transfers performed with CGA to min A, increased assist needed as compared to previous sessions. Stedy transfer back to [facility] at end of session.   PATIENT EDUCATION: Education details: continue HEP, keep working on standing to RW if safe and able otherwise stedy with decreased UE support Person educated: Patient and wife Anthony Downs Education method: Explanation Education comprehension: verbalized understanding and needs further education  HOME EXERCISE PROGRAM: Access code: [email or web address] (from previous bout of therapy)   Verbally added 03/05/22: Seated L/R lateral leans from [employer] x 5 reps onto level surface Seated mini-crunches from semi-reclined PWC or recliner x 10 reps  GOALS: Goals reviewed with patient? Yes  SHORT TERM GOALS: Target date: 04/07/2022   Pt and family will be independent with initial review of HEP for improved strength, balance, and transfers. Baseline: Goal status: INITIAL  2.  Pt will demonstrate ability to roll L and R with max A x 1 to decrease caregiver burden Baseline: total A Goal status: INITIAL  3.  Pt will perform slide board transfer with total A in clinic setting Baseline: unsafe to perform at home Goal status: INITIAL  4.  Pt will perform sit to stand with LRAD and max A x 1 Baseline: not assessed at  eval Goal status: INITIAL   LONG TERM GOALS: Target date: 05/18/2022   Pt and family will be independent with final HEP for improved strength, balance, and transfers. Baseline:  Goal status: INITIAL  2.  Pt will demonstrate ability to roll L and R with mod A x 1 to decrease caregiver burden Baseline: total A Goal status: INITIAL  3.  Pt will perform SB transfer with max A x 1 to demonstrate improved functional mobility and decrease caregiver burden Baseline:  Goal status: INITIAL  4.  Pt will tolerate standing x 5 min while performing functional task to demonstrate improved functional mobility Baseline: not assessed at eval Goal status: INITIAL  5.  Pt will perform sit to stand with mod A x 1 to demonstrate improved functional mobility and decreased caregiver burden Baseline: not assessed at eval Goal status: INITIAL  6.  Pt will perform stand pivot transfer with RW and mod A x 1 to demonstrate improved functional mobility Baseline: not assessed at eval Goal status: INITIAL  ASSESSMENT:  CLINICAL IMPRESSION: Emphasis of skilled PT session on continuing to work on sit to stands with decreased UE support and in a manner that pt can safely continue to practice with at home. Pt exhibits increased difficulty with sit to stand transfer this date with increased UE support needed and increased physical assist needed to complete transfer. Pt also continues to exhibit difficulty performing anterior scooting on EOM of his R hip, would benefit  from continued practice of this. Pt continues to benefit from skilled therapy services to address ongoing BLE weakness leading to decreased safety and independence with functional mobility. Continue POC.   OBJECTIVE IMPAIRMENTS: decreased activity tolerance, decreased balance, decreased cognition, decreased coordination, decreased endurance, decreased mobility, difficulty walking, decreased ROM, decreased strength, decreased safety awareness, increased  edema, and impaired flexibility.   ACTIVITY LIMITATIONS: sitting, standing, transfers, bed mobility, and continence  PARTICIPATION LIMITATIONS: driving and community activity  PERSONAL FACTORS: Age, Behavior pattern, Time since onset of injury/illness/exacerbation, and 3+ comorbidities:    anxiety, OA, cancer, subdural hematoma are also affecting patient's functional outcome.   REHAB POTENTIAL: Fair time since onset of diagnosis; poor pt initiation and motivation  CLINICAL DECISION MAKING: Stable/uncomplicated  EVALUATION COMPLEXITY: Low  PLAN:  PT FREQUENCY: 1-2x/week  PT DURATION: 12 weeks  PLANNED INTERVENTIONS: Therapeutic exercises, Therapeutic activity, Neuromuscular re-education, Balance training, Gait training, Patient/Family education, Self Care, Joint mobilization, Vestibular training, Canalith repositioning, Visual/preceptual remediation/compensation, Orthotic/Fit training, DME instructions, Dry Needling, Cognitive remediation, Wheelchair mobility training, Spinal mobilization, Cryotherapy, Moist heat, Taping, Manual therapy, and Re-evaluation  PLAN FOR NEXT SESSION: review pressure relief techniques, standing to bari RW trying to take sidesteps, work on sit to stand to stedy with decreased UE support, work on standing to [employer], work on [employer] transfers, work on bed mobility (rolling, supine to/from sit), focus on anterior/posterior scooting EOM or in [place], PT, DPT, CSRS 03/19/2022, 2:49 PM

## 2022-03-24 ENCOUNTER — Ambulatory Visit: Payer: Medicare Other | Admitting: Physical Therapy

## 2022-03-24 DIAGNOSIS — M6281 Muscle weakness (generalized): Secondary | ICD-10-CM | POA: Diagnosis not present

## 2022-03-24 DIAGNOSIS — R293 Abnormal posture: Secondary | ICD-10-CM

## 2022-03-24 DIAGNOSIS — D329 Benign neoplasm of meninges, unspecified: Secondary | ICD-10-CM

## 2022-03-24 DIAGNOSIS — R2689 Other abnormalities of gait and mobility: Secondary | ICD-10-CM

## 2022-03-24 DIAGNOSIS — R2681 Unsteadiness on feet: Secondary | ICD-10-CM

## 2022-03-24 NOTE — Therapy (Signed)
OUTPATIENT PHYSICAL THERAPY NEURO TREATMENT   Patient Name: Anthony Downs MRN: [id number] DOB:[birth year], 72 y.o., male Today's Date: 03/24/2022   PCP: Anthony School, MD REFERRING PROVIDER: [name], MD  END OF SESSION:  PT End of Session - 03/24/22 1403     Visit Number 8    Number of Visits 25   with eval   Date for PT Re-Evaluation 05/18/22    Authorization Type Medicare and UHC (needs 10th visit progress note)    Authorization - Number of Visits 30   30 VL   Progress Note Due on Visit 10    PT Start Time 1400    PT Stop Time 1448    PT Time Calculation (min) 48 min    Equipment Utilized During Treatment Gait belt    Activity Tolerance Patient tolerated treatment well    Behavior During Therapy WFL for tasks assessed/performed                    Past Medical History:  Diagnosis Date   Acid reflux    Anxiety    Arthritis    Asthma    as a child   Basal cell carcinoma 01/26/2021   RIGHT POST SURICULAR INFERIOR   Basal cell carcinoma 01/26/2021   RIGHT POST AURICULAR SUPERIOR   Benign brain tumor ([place])    Cancer ([place])    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, squamous behind ear   Complication of anesthesia    slow to wake up and pain medicines make him sick   GERD (gastroesophageal reflux disease)    Hypercholesteremia    Hypertension    PONV (postoperative nausea and vomiting)    Sleep apnea    no Cpap   Subdural hematoma, 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   fell off truck hit head on concrete   Past Surgical History:  Procedure Laterality Date   APPLICATION OF CRANIAL NAVIGATION Left 04/29/2020   Procedure: [place];  Surgeon: Anthony Part, MD;  Location: [facility];  Service: Neurosurgery;  Laterality: Left;   arthroscopic knee     COLONOSCOPY N/A 11/29/2018   Procedure: COLONOSCOPY;  Surgeon: Anthony Houston, MD;  [facility];  Service: Endoscopy;  Laterality: N/A;  730-rescheduled 9/2 same time per  Ann   CRANIOTOMY Left 04/29/2020   Procedure: LEFT CRANIECTOMY WITH TUMOR EXCISION;  Surgeon: Anthony Part, MD;  Location: [facility];  Service: Neurosurgery;  Laterality: Left;   KNEE SURGERY Right    NASAL SEPTOPLASTY W/ TURBINOPLASTY Bilateral 03/19/2020   Procedure: NASAL SEPTOPLASTY WITH BILATERAL  TURBINATE REDUCTION;  Surgeon: Anthony Baptist, MD;  Location: [facility];  Service: ENT;  Laterality: Bilateral;   VASECTOMY     Patient Active Problem List   Diagnosis Date Noted   Seizure ([place]) 08/13/2020   Seizures ([place]) 08/12/2020   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   DVT (deep venous thrombosis) ([place]) 08/12/2020   COVID-19 virus infection 08/12/2020   Acute lower UTI 07/14/2020   Delirium 07/06/2020   Sleep apnea 07/06/2020   GERD (gastroesophageal reflux disease) 07/06/2020   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[place]) 06/25/2020   Sundowning 06/25/2020   Slow transit constipation    Essential hypertension    Hypokalemia    Postoperative pain    Meningioma ([place]) 05/02/2020   Brain tumor ([place]) 04/28/2020   S/P nasal septoplasty 03/19/2020   Special screening for malignant neoplasms, colon 01/09/2018    ONSET DATE: 02/19/2022  REFERRING DIAG: D32.9 (ICD-10-CM) - Meningioma (  [place]) G82.50 (ICD-10-CM) - 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[place])  THERAPY DIAG:  Muscle weakness (generalized)  Unsteadiness on feet  Other abnormalities of gait and mobility  Abnormal posture  Meningioma ([facility])  Rationale for Evaluation and Treatment: Rehabilitation  SUBJECTIVE:                                                                                                                                                                                             SUBJECTIVE STATEMENT: Pt continues to complain of pain in his L side rib area, pain appears to have moved more medially. Pt's wife to reach out to PCP about ongoing pain. Pt reports area is not tender to the touch necessarily but hurts with sidebend. Pt feels like he doesn't have as much  energy and has felt this way over the past few weeks.  Pt accompanied by: family member wife Anthony Downs  PERTINENT HISTORY: anxiety, OA, cancer, subdural hematoma   PAIN:  Are you having pain? Yes: NPRS scale: 6/10 Pain location: Buttocks Pain description: soreness Aggravating factors: coughing Relieving factors: repositioning  PRECAUTIONS: Fall  WEIGHT BEARING RESTRICTIONS: No  FALLS: Has patient fallen in last 6 months? 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 There are mild chronic small-vessel ischemic changes of the white matter. There is atrophy, encephalomalacia and gliosis along the medial aspects of both sides of the brain at the frontoparietal vertex, sequela of the previous tumor and subsequent resection. It appears that the midportion of the superior sagittal sinus has been resected. No evidence of tumor in the venous sinuses anterior or posterior to that.   From MRI on 06/07/2021: Redemonstrated sequela of prior vertex craniotomy/cranioplasty and parafalcine meningioma resection with partial resection of the superior sagittal sinus. No evidence of residual/recurrent tumor at the resection site.   Chronic intracranial findings without interval change, as described.   Paranasal sinus disease, as outlined.   LOWER EXTREMITY MMT:    MMT Right Eval Left Eval  Hip flexion 2- 2-  Hip extension    Hip abduction    Hip adduction    Hip internal rotation    Hip external rotation    Knee flexion 2- 5  Knee extension 2- 5  Ankle dorsiflexion  0 0  Ankle plantarflexion    Ankle inversion    Ankle eversion    (Blank rows = not tested)    TODAY'S TREATMENT:   NMR: Sit to stand with min A from [place] to stedy, stedy transfer to mat table. Session focus on standing to RW, pushing with LUE from mat table. Pt initially needs min A to stand, increases to mod A with onset of fatigue except for during last repetition which pt is able to perform with CGA. Pt tolerates standing up to 4 min during one repetition, needs cues for hip and trunk extension for upright posture. Pt with ongoing difficulty scooting hips forwards on mat table to prepare for sit to stands. Stedy transfer back to [facility] at end of session with min A needed to stand.   PATIENT EDUCATION: Education details: continue HEP, keep working on standing to RW if safe and able otherwise stedy with decreased UE support Person educated: Patient and wife Anthony Downs Education method: Explanation Education comprehension: verbalized understanding and needs further education  HOME EXERCISE PROGRAM: Access code: [email or web address] (from previous bout of therapy)   Verbally added 03/05/22: Seated L/R lateral leans from [place] x 5 reps onto level surface Seated mini-crunches from semi-reclined PWC or recliner x 10 reps  GOALS: Goals reviewed with patient? Yes  SHORT TERM GOALS: Target date: 04/07/2022   Pt and family will be independent with initial review of HEP for improved strength, balance, and transfers. Baseline: Goal status: INITIAL  2.  Pt will demonstrate ability to roll L and R with max A x 1 to decrease caregiver burden Baseline: total A Goal status: INITIAL  3.  Pt will perform slide board transfer with total A in clinic setting Baseline: unsafe to perform at home Goal status: INITIAL  4.  Pt will perform sit to stand with LRAD and max A x 1 Baseline: not assessed at eval Goal status: INITIAL   LONG TERM GOALS: Target date: 05/18/2022   Pt and family will be independent with final  HEP for improved strength, balance, and transfers. Baseline:  Goal status: INITIAL  2.  Pt will demonstrate ability to roll L and R with mod A x 1 to decrease caregiver burden Baseline: total A Goal status: INITIAL  3.  Pt will perform SB transfer with max A x 1 to demonstrate improved functional mobility and decrease caregiver burden Baseline:  Goal status: INITIAL  4.  Pt will tolerate standing x 5 min while performing functional task to demonstrate improved functional mobility Baseline: not assessed at eval Goal status: INITIAL  5.  Pt will perform sit to stand with mod A x 1 to demonstrate improved functional mobility and decreased caregiver burden Baseline: not assessed at eval Goal status: INITIAL  6.  Pt will perform stand pivot transfer with RW and mod A x 1 to demonstrate improved functional mobility Baseline: not assessed at eval Goal status: INITIAL  ASSESSMENT:  CLINICAL IMPRESSION: Emphasis of skilled PT session on continuing to work on standing to RW pushing up from mat table and continuing to work on scooting hips while seated EOM. Pt is limited this session by his fatigue and needs increased physical assist for standing to both stedy and RW. Pt does exhibit improved endurance for standing and is able to stand x 4 min at the most. Pt also continues to remain limited by not having as much assist at home to work on standing to the RW safely. Pt continues to benefit from skilled  therapy services to address ongoing global weakness and decreased safety and independence with functional mobility. Continue POC.   OBJECTIVE IMPAIRMENTS: decreased activity tolerance, decreased balance, decreased cognition, decreased coordination, decreased endurance, decreased mobility, difficulty walking, decreased ROM, decreased strength, decreased safety awareness, increased edema, and impaired flexibility.   ACTIVITY LIMITATIONS: sitting, standing, transfers, bed mobility, and  continence  PARTICIPATION LIMITATIONS: driving and community activity  PERSONAL FACTORS: Age, Behavior pattern, Time since onset of injury/illness/exacerbation, and 3+ comorbidities:    anxiety, OA, cancer, subdural hematoma are also affecting patient's functional outcome.   REHAB POTENTIAL: Fair time since onset of diagnosis; poor pt initiation and motivation  CLINICAL DECISION MAKING: Stable/uncomplicated  EVALUATION COMPLEXITY: Low  PLAN:  PT FREQUENCY: 1-2x/week  PT DURATION: 12 weeks  PLANNED INTERVENTIONS: Therapeutic exercises, Therapeutic activity, Neuromuscular re-education, Balance training, Gait training, Patient/Family education, Self Care, Joint mobilization, Vestibular training, Canalith repositioning, Visual/preceptual remediation/compensation, Orthotic/Fit training, DME instructions, Dry Needling, Cognitive remediation, Wheelchair mobility training, Spinal mobilization, Cryotherapy, Moist heat, Taping, Manual therapy, and Re-evaluation  PLAN FOR NEXT SESSION: review pressure relief techniques, standing to bari RW trying to take sidesteps, work on sit to stand to stedy with decreased UE support, work on standing to [employer], work on [employer] transfers, work on bed mobility (rolling, supine to/from sit), focus on anterior/posterior scooting EOM or in [place], PT, DPT, CSRS 03/24/2022, 2:48 PM

## 2022-03-25 ENCOUNTER — Other Ambulatory Visit (HOSPITAL_COMMUNITY): Payer: Self-pay | Admitting: Internal Medicine

## 2022-03-25 DIAGNOSIS — R0781 Pleurodynia: Secondary | ICD-10-CM

## 2022-03-25 DIAGNOSIS — M5414 Radiculopathy, thoracic region: Secondary | ICD-10-CM

## 2022-03-26 ENCOUNTER — Ambulatory Visit: Payer: Medicare Other | Admitting: Physical Therapy

## 2022-03-26 VITALS — BP 121/71

## 2022-03-26 DIAGNOSIS — R293 Abnormal posture: Secondary | ICD-10-CM

## 2022-03-26 DIAGNOSIS — M6281 Muscle weakness (generalized): Secondary | ICD-10-CM

## 2022-03-26 DIAGNOSIS — R2689 Other abnormalities of gait and mobility: Secondary | ICD-10-CM

## 2022-03-26 DIAGNOSIS — R2681 Unsteadiness on feet: Secondary | ICD-10-CM

## 2022-03-26 NOTE — Therapy (Signed)
OUTPATIENT PHYSICAL THERAPY NEURO TREATMENT   Patient Name: PAARTH MACHO MRN: [id number] DOB:10-12-1950, 72 y.o., male Today's Date: 03/26/2022   PCP: Redmond School, MD REFERRING PROVIDER: [name], MD  END OF SESSION:  PT End of Session - 03/26/22 1359     Visit Number 9    Number of Visits 25   with eval   Date for PT Re-Evaluation 05/18/22    Authorization Type Medicare and UHC (needs 10th visit progress note)    Authorization - Number of Visits 30   30 VL   Progress Note Due on Visit 10    PT Start Time [id number]    PT Stop Time 1448    PT Time Calculation (min) 50 min    Equipment Utilized During Treatment Gait belt    Activity Tolerance Patient limited by fatigue    Behavior During Therapy WFL for tasks assessed/performed                     Past Medical History:  Diagnosis Date   Acid reflux    Anxiety    Arthritis    Asthma    as a child   Basal cell carcinoma 01/26/2021   RIGHT POST SURICULAR INFERIOR   Basal cell carcinoma 01/26/2021   RIGHT POST AURICULAR SUPERIOR   Benign brain tumor ([place])    Cancer ([place])    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, squamous behind ear   Complication of anesthesia    slow to wake up and pain medicines make him sick   GERD (gastroesophageal reflux disease)    Hypercholesteremia    Hypertension    PONV (postoperative nausea and vomiting)    Sleep apnea    no Cpap   Subdural hematoma, 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   fell off truck hit head on concrete   Past Surgical History:  Procedure Laterality Date   APPLICATION OF CRANIAL NAVIGATION Left 04/29/2020   Procedure: [place];  Surgeon: Judith Part, MD;  Location: [facility];  Service: Neurosurgery;  Laterality: Left;   arthroscopic knee     COLONOSCOPY N/A 11/29/2018   Procedure: COLONOSCOPY;  Surgeon: Rogene Houston, MD;  [facility];  Service: Endoscopy;  Laterality: N/A;  730-rescheduled 9/2 same time per Ann    CRANIOTOMY Left 04/29/2020   Procedure: LEFT CRANIECTOMY WITH TUMOR EXCISION;  Surgeon: Judith Part, MD;  Location: [facility];  Service: Neurosurgery;  Laterality: Left;   KNEE SURGERY Right    NASAL SEPTOPLASTY W/ TURBINOPLASTY Bilateral 03/19/2020   Procedure: NASAL SEPTOPLASTY WITH BILATERAL  TURBINATE REDUCTION;  Surgeon: Leta Baptist, MD;  Location: [facility] [place];  Service: ENT;  Laterality: Bilateral;   VASECTOMY     Patient Active Problem List   Diagnosis Date Noted   Seizure ([place]) 08/13/2020   Seizures ([place]) 08/12/2020   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) 08/12/2020   COVID-19 virus infection 08/12/2020   Acute lower UTI 07/14/2020   Delirium 07/06/2020   Sleep apnea 07/06/2020   GERD (gastroesophageal reflux disease) 07/06/2020   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[place]) 06/25/2020   Sundowning 06/25/2020   Slow transit constipation    Essential hypertension    Hypokalemia    Postoperative pain    Meningioma ([place]) 05/02/2020   Brain tumor ([place]) 04/28/2020   S/P nasal septoplasty 03/19/2020   Special screening for malignant neoplasms, colon 01/09/2018    ONSET DATE: 02/19/2022  REFERRING DIAG: D32.9 (ICD-10-CM) -  Meningioma ([facility]) G82.50 (ICD-10-CM) - 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[place])  THERAPY DIAG:  Unsteadiness on feet  Other abnormalities of gait and mobility  Abnormal posture  Muscle weakness (generalized)  Rationale for Evaluation and Treatment: Rehabilitation  SUBJECTIVE:                                                                                                                                                                                             SUBJECTIVE STATEMENT: Pt continues to complain of pain in his L side rib area w/movement, has a CT scheduled for the 20th to have it checked out. Has not been standing much at home as [name] cannot help much due to her shoulder.   Pt accompanied by: family member wife [name]  PERTINENT HISTORY: anxiety, OA,  cancer, subdural hematoma   PAIN:  Are you having pain? No  PRECAUTIONS: Fall  WEIGHT BEARING RESTRICTIONS: No  FALLS: Has patient fallen in last 6 months? 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 There are mild chronic small-vessel ischemic changes of the white matter. There is atrophy, encephalomalacia and gliosis along the medial aspects of both sides of the brain at the frontoparietal vertex, sequela of the previous tumor and subsequent resection. It appears that the midportion of the superior sagittal sinus has been resected. No evidence of tumor in the venous sinuses anterior or posterior to that.   From MRI on 06/07/2021: Redemonstrated sequela of prior vertex craniotomy/cranioplasty and parafalcine meningioma resection with partial resection of the superior sagittal sinus. No evidence of residual/recurrent tumor at the resection site.   Chronic intracranial findings without interval change, as described.   Paranasal sinus disease, as outlined.   LOWER EXTREMITY MMT:    MMT Right Eval Left Eval  Hip flexion 2- 2-  Hip extension    Hip abduction    Hip adduction    Hip internal rotation    Hip external rotation    Knee flexion 2- 5  Knee extension 2- 5  Ankle dorsiflexion 0 0  Ankle plantarflexion    Ankle inversion    Ankle eversion    (Blank rows = not tested)    TODAY'S TREATMENT:   NMR Attempted sit <>stand from chair to  bariatric RW w/max A x1 and [name] blocking RW. Pt required max verbal cues to scoot hips to edge of mat and  mod A to perform. Pt unable to perform sit <>stand w/max A due to inability to shift weight forward and brace through legs. Regressed to slide board transfer to L side to work on lateral weight shifting w/max Ax1, but pt unable to scoot. Obtained second therapist and attempted board transfer w/max A x2, but pt required total A x2 and was unable to complete transfer. W/Max Ax2 and [name] bracing RW, had pt stand to bariatric RW to remove board from pt and adjust hips in chair. Stand <>sit w/mod A for eccentric control and pt able to scoot hips back in chair w/max verbal cues.  Educated pt and [name] on practicing lateral scooting in bed at home and seated marches to facilitate lateral weight shifting and work on improved Econom[employer]. Pt and wife verbalized understanding.    PATIENT EDUCATION: Education details: continue HEP, see above  Person educated: Patient and wife [name] Education method: Explanation Education comprehension: verbalized understanding and needs further education  HOME EXERCISE PROGRAM: Access code: [id number] (from previous bout of therapy)   Verbally added 03/05/22: Seated L/R lateral leans from [place] x 5 reps onto level surface Seated mini-crunches from semi-reclined PWC or recliner x 10 reps  GOALS: Goals reviewed with patient? Yes  SHORT TERM GOALS: Target date: 04/07/2022   Pt and family will be independent with initial review of HEP for improved strength, balance, and transfers. Baseline: Goal status: INITIAL  2.  Pt will demonstrate ability to roll L and R with max A x 1 to decrease caregiver burden Baseline: total A Goal status: INITIAL  3.  Pt will perform slide board transfer with total A in clinic setting Baseline: unsafe to perform at home Goal status: INITIAL  4.  Pt will perform sit to stand with LRAD and max A x 1 Baseline: not assessed at eval Goal status: INITIAL   LONG TERM GOALS: Target date: 05/18/2022   Pt and family will be independent with  final HEP for improved strength, balance, and transfers. Baseline:  Goal status: INITIAL  2.  Pt will demonstrate ability to roll L and R with mod A x 1 to decrease caregiver burden Baseline: total A Goal status: INITIAL  3.  Pt will perform SB transfer with max A x 1 to demonstrate improved functional mobility and decrease caregiver burden Baseline:  Goal status: INITIAL  4.  Pt will tolerate standing x 5 min while performing functional task to demonstrate improved functional mobility Baseline: not assessed at eval Goal status: INITIAL  5.  Pt will perform sit to stand with mod A x 1 to demonstrate improved functional mobility and decreased caregiver burden Baseline: not assessed at eval Goal status: INITIAL  6.  Pt will perform stand pivot transfer with RW and mod A x 1 to demonstrate improved functional mobility Baseline: not assessed at eval Goal status: INITIAL  ASSESSMENT:  CLINICAL IMPRESSION: Emphasis of skilled PT session on sit <>stand transfers, lateral weight shifting and head-hip relationship. Pt unable to perform sit <>stand today, requiring total A x2 to perform. Pt also unable to perform lateral transfer today, requiring max A x2 to facilitate scoot. Educated pt to work on lateral weight shifting at home on bed to prime movement for slide board transfers as standing to RW is not safe. Continue POC.   OBJECTIVE IMPAIRMENTS: decreased activity tolerance, decreased balance, decreased  cognition, decreased coordination, decreased endurance, decreased mobility, difficulty walking, decreased ROM, decreased strength, decreased safety awareness, increased edema, and impaired flexibility.   ACTIVITY LIMITATIONS: sitting, standing, transfers, bed mobility, and continence  PARTICIPATION LIMITATIONS: driving and community activity  PERSONAL FACTORS: Age, Behavior pattern, Time since onset of injury/illness/exacerbation, and 3+ comorbidities:    anxiety, OA, cancer, subdural  hematoma are also affecting patient's functional outcome.   REHAB POTENTIAL: Fair time since onset of diagnosis; poor pt initiation and motivation  CLINICAL DECISION MAKING: Stable/uncomplicated  EVALUATION COMPLEXITY: Low  PLAN:  PT FREQUENCY: 1-2x/week  PT DURATION: 12 weeks  PLANNED INTERVENTIONS: Therapeutic exercises, Therapeutic activity, Neuromuscular re-education, Balance training, Gait training, Patient/Family education, Self Care, Joint mobilization, Vestibular training, Canalith repositioning, Visual/preceptual remediation/compensation, Orthotic/Fit training, DME instructions, Dry Needling, Cognitive remediation, Wheelchair mobility training, Spinal mobilization, Cryotherapy, Moist heat, Taping, Manual therapy, and Re-evaluation  PLAN FOR NEXT SESSION: review pressure relief techniques, standing to bari RW trying to take sidesteps, work on sit to stand to stedy with decreased UE support, work on standing to [employer], work on [employer] transfers, work on bed mobility (rolling, supine to/from sit), focus on anterior/posterior scooting EOM or in [employer] E Nyna Chilton, PT, DPT 03/26/2022, 2:50 PM

## 2022-03-29 ENCOUNTER — Ambulatory Visit: Payer: Self-pay | Admitting: Physical Therapy

## 2022-03-31 ENCOUNTER — Ambulatory Visit: Payer: Medicare Other | Admitting: Physical Therapy

## 2022-03-31 DIAGNOSIS — R2681 Unsteadiness on feet: Secondary | ICD-10-CM

## 2022-03-31 DIAGNOSIS — M6281 Muscle weakness (generalized): Secondary | ICD-10-CM

## 2022-03-31 DIAGNOSIS — R293 Abnormal posture: Secondary | ICD-10-CM

## 2022-03-31 DIAGNOSIS — R2689 Other abnormalities of gait and mobility: Secondary | ICD-10-CM

## 2022-03-31 DIAGNOSIS — D329 Benign neoplasm of meninges, unspecified: Secondary | ICD-10-CM

## 2022-03-31 NOTE — Therapy (Signed)
OUTPATIENT PHYSICAL THERAPY NEURO TREATMENT-ARRIVED NO CHARGE-DISCHARGE NOTE   Patient Name: Anthony Downs MRN: [id number] DOB:1950/03/25, 72 y.o., male Today's Date: 03/31/2022   PCP: Redmond School, MD REFERRING PROVIDER: [name], MD  PHYSICAL THERAPY DISCHARGE SUMMARY  Visits from Start of Care: 9  Current functional level related to goals / functional outcomes: Total A to dependent for transfers via stedy or slide board   Remaining deficits: Decreased LE strength and ROM, decreased safety and independence with functional mobility   Education / Equipment: Handout for HEP if safe and able to perform   Patient agrees to discharge. Patient goals were partially met. Patient is being discharged due to  pt's wife with ongoing medical issues and unable to safely assist pt with progressing at home, wishing to d/c from therapy until she medically improves and he carryover therapy tasks in home environment.   END OF SESSION:  PT End of Session - 03/31/22 1320     Visit Number 9    Number of Visits 25   with eval   Date for PT Re-Evaluation 05/18/22    Authorization Type Medicare and [employer] (needs 10th visit progress note)    Authorization - Number of Visits 30   30 VL   Progress Note Due on Visit 10    PT Start Time [id number]    PT Stop Time [id number]   arrived no charge   PT Time Calculation (min) 21 min    Equipment Utilized During Treatment --    Activity Tolerance Patient limited by fatigue    Behavior During Therapy WFL for tasks assessed/performed                      Past Medical History:  Diagnosis Date   Acid reflux    Anxiety    Arthritis    Asthma    as a child   Basal cell carcinoma 01/26/2021   RIGHT POST SURICULAR INFERIOR   Basal cell carcinoma 01/26/2021   RIGHT POST AURICULAR SUPERIOR   Benign brain tumor ([place])    Cancer ([place])    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, squamous behind ear   Complication of anesthesia    slow to wake up  and pain medicines make him sick   GERD (gastroesophageal reflux disease)    Hypercholesteremia    Hypertension    PONV (postoperative nausea and vomiting)    Sleep apnea    no Cpap   Subdural hematoma, post-traumatic ([place]) 2008   fell off truck hit head on concrete   Past Surgical History:  Procedure Laterality Date   APPLICATION OF CRANIAL NAVIGATION Left 04/29/2020   Procedure: [place];  Surgeon: Judith Part, MD;  Location: [facility];  Service: Neurosurgery;  Laterality: Left;   arthroscopic knee     COLONOSCOPY N/A 11/29/2018   Procedure: COLONOSCOPY;  Surgeon: Rogene Houston, MD;  [facility];  Service: Endoscopy;  Laterality: N/A;  730-rescheduled 9/2 same time per Ann   CRANIOTOMY Left 04/29/2020   Procedure: LEFT CRANIECTOMY WITH TUMOR EXCISION;  Surgeon: Judith Part, MD;  Location: [facility];  Service: Neurosurgery;  Laterality: Left;   KNEE SURGERY Right    NASAL SEPTOPLASTY W/ TURBINOPLASTY Bilateral 03/19/2020   Procedure: NASAL SEPTOPLASTY WITH BILATERAL  TURBINATE REDUCTION;  Surgeon: Leta Baptist, MD;  Location: [facility];  Service: ENT;  Laterality: Bilateral;   VASECTOMY     Patient Active Problem List  Diagnosis Date Noted   Seizure ([place]) 08/13/2020   Seizures ([place]) 08/12/2020   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   DVT (deep venous thrombosis) ([place]) 08/12/2020   COVID-19 virus infection 08/12/2020   Acute lower UTI 07/14/2020   Delirium 07/06/2020   Sleep apnea 07/06/2020   GERD (gastroesophageal reflux disease) 07/06/2020   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[place]) 06/25/2020   Sundowning 06/25/2020   Slow transit constipation    Essential hypertension    Hypokalemia    Postoperative pain    Meningioma ([place]) 05/02/2020   Brain tumor ([place]) 04/28/2020   S/P nasal septoplasty 03/19/2020   Special screening for malignant neoplasms, colon 01/09/2018    ONSET DATE: 02/19/2022  REFERRING DIAG: D32.9 (ICD-10-CM) - Meningioma (HCC) G82.50  (ICD-10-CM) - 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[place])  THERAPY DIAG:  Unsteadiness on feet  Other abnormalities of gait and mobility  Abnormal posture  Muscle weakness (generalized)  Meningioma (HCC)  Rationale for Evaluation and Treatment: Rehabilitation  SUBJECTIVE:                                                                                                                                                                                             SUBJECTIVE STATEMENT: Pt's wife/caregiver reports that she currently has pneumonia and is likely going to have to have back surgery in the near future. She also reports that she has a R biceps tear and is going to have PT and injections for this. Pt's family working on setting up 24/7 care in the home but there is a possibility of pt needing a short-term stay in assisted living while his wife is recovering from her injuries. Pt and family make decision to d/c from therapy at this time as he is not able to work on HEP, standing, or slide board transfers safely at home currently. They will reach back out to referring provider when they are ready to return to therapy.  Pt accompanied by: family member wife [name]  PERTINENT HISTORY: anxiety, OA, cancer, subdural hematoma   PAIN:  Are you having pain? No  PRECAUTIONS: Fall  WEIGHT BEARING RESTRICTIONS: No  FALLS: Has patient fallen in last 6 months? 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 There are mild chronic small-vessel ischemic  changes of the white matter. There is  atrophy, encephalomalacia and gliosis along the medial aspects of both sides of the brain at the frontoparietal vertex, sequela of the previous tumor and subsequent resection. It appears that the midportion of the superior sagittal sinus has been resected. No evidence of tumor in the venous sinuses anterior or posterior to that.   From MRI on 06/07/2021: Redemonstrated sequela of prior vertex craniotomy/cranioplasty and parafalcine meningioma resection with partial resection of the superior sagittal sinus. No evidence of residual/recurrent tumor at the resection site.   Chronic intracranial findings without interval change, as described.   Paranasal sinus disease, as outlined.   LOWER EXTREMITY MMT:    MMT Right Eval Left Eval  Hip flexion 2- 2-  Hip extension    Hip abduction    Hip adduction    Hip internal rotation    Hip external rotation    Knee flexion 2- 5  Knee extension 2- 5  Ankle dorsiflexion 0 0  Ankle plantarflexion    Ankle inversion    Ankle eversion    (Blank rows = not tested)    TODAY'S TREATMENT:   N/A, Arrived no charge   PATIENT EDUCATION: Education details: continue HEP, d/c from PT Person educated: Patient and wife [name] Education method: Explanation Education comprehension: verbalized understanding and needs further education  HOME EXERCISE PROGRAM: Access code: [email or web address] (from previous bout of therapy)   Verbally added 03/05/22: Seated L/R lateral leans from [place] x 5 reps onto level surface Seated mini-crunches from semi-reclined PWC or recliner x 10 reps  GOALS: Goals reviewed with patient? Yes  SHORT TERM GOALS: Target date: 04/07/2022   Pt and family will be independent with initial review of HEP for improved strength, balance, and transfers. Baseline: Goal status: MET  2.  Pt will demonstrate ability to roll L and R with max A x 1 to decrease caregiver burden Baseline: total A Goal status: MET  3.  Pt will perform slide board  transfer with total A in clinic setting Baseline: unsafe to perform at home Goal status: MET  4.  Pt will perform sit to stand with LRAD and max A x 1 Baseline: not assessed at eval Goal status: MET   LONG TERM GOALS: Target date: 05/18/2022   Pt and family will be independent with final HEP for improved strength, balance, and transfers. Baseline:  Goal status: NOT MET  2.  Pt will demonstrate ability to roll L and R with mod A x 1 to decrease caregiver burden Baseline: total A Goal status: NOT MET  3.  Pt will perform SB transfer with max A x 1 to demonstrate improved functional mobility and decrease caregiver burden Baseline:  Goal status: NOT MET  4.  Pt will tolerate standing x 5 min while performing functional task to demonstrate improved functional mobility Baseline: not assessed at eval Goal status: NOT MET  5.  Pt will perform sit to stand with mod A x 1 to demonstrate improved functional mobility and decreased caregiver burden Baseline: not assessed at eval Goal status: NOT MET  6.  Pt will perform stand pivot transfer with RW and mod A x 1 to demonstrate improved functional mobility Baseline: not assessed at eval Goal status: NOT MET  ASSESSMENT:  CLINICAL IMPRESSION: Arrived no charge due to pt and family decision to d/c from PT at this time. Pt has met 4/4 STG, 0/4 LTG. Pt to return to OPPT services once his wife is safe and able  to assist him at home pending her own medical issues.  OBJECTIVE IMPAIRMENTS: decreased activity tolerance, decreased balance, decreased cognition, decreased coordination, decreased endurance, decreased mobility, difficulty walking, decreased ROM, decreased strength, decreased safety awareness, increased edema, and impaired flexibility.   ACTIVITY LIMITATIONS: sitting, standing, transfers, bed mobility, and continence  PARTICIPATION LIMITATIONS: driving and community activity  PERSONAL FACTORS: Age, Behavior pattern, Time since onset  of injury/illness/exacerbation, and 3+ comorbidities:    anxiety, OA, cancer, subdural hematoma are also affecting patient's functional outcome.   REHAB POTENTIAL: Fair time since onset of diagnosis; poor pt initiation and motivation  CLINICAL DECISION MAKING: Stable/uncomplicated  EVALUATION COMPLEXITY: Low     Excell Seltzer, PT, DPT, CSRS 03/31/2022, 1:39 PM

## 2022-04-02 ENCOUNTER — Ambulatory Visit: Payer: Medicare Other | Admitting: Physical Therapy

## 2022-04-06 ENCOUNTER — Ambulatory Visit: Payer: Medicare Other | Admitting: Physical Therapy

## 2022-04-07 ENCOUNTER — Ambulatory Visit: Payer: Medicare Other | Admitting: Physical Therapy

## 2022-04-09 ENCOUNTER — Ambulatory Visit: Payer: Medicare Other | Admitting: Physical Therapy

## 2022-04-12 ENCOUNTER — Ambulatory Visit: Payer: Medicare Other | Admitting: Physical Therapy

## 2022-04-14 ENCOUNTER — Ambulatory Visit: Payer: Medicare Other | Admitting: Physical Therapy

## 2022-04-15 ENCOUNTER — Ambulatory Visit (HOSPITAL_COMMUNITY): Payer: Medicare Other

## 2022-04-15 ENCOUNTER — Ambulatory Visit (HOSPITAL_COMMUNITY)
Admission: RE | Admit: 2022-04-15 | Discharge: 2022-04-15 | Disposition: A | Discharge status: Home / Self Care | Payer: Medicare Other | Source: Ambulatory Visit | Attending: Internal Medicine | Admitting: Internal Medicine

## 2022-04-15 DIAGNOSIS — R0781 Pleurodynia: Secondary | ICD-10-CM | POA: Diagnosis present

## 2022-04-15 DIAGNOSIS — M5414 Radiculopathy, thoracic region: Secondary | ICD-10-CM | POA: Insufficient documentation

## 2022-04-21 ENCOUNTER — Ambulatory Visit: Payer: Medicare Other | Admitting: Physical Therapy

## 2022-04-21 ENCOUNTER — Encounter: Payer: Self-pay | Admitting: Physical Medicine & Rehabilitation

## 2022-04-21 ENCOUNTER — Encounter: Payer: Medicare Other | Attending: Physical Medicine & Rehabilitation | Admitting: Physical Medicine & Rehabilitation

## 2022-04-21 VITALS — BP 132/86 | HR 72

## 2022-04-21 DIAGNOSIS — R600 Localized edema: Secondary | ICD-10-CM | POA: Diagnosis not present

## 2022-04-21 NOTE — Patient Instructions (Addendum)
ALWAYS FEEL FREE TO CALL OUR OFFICE WITH ANY PROBLEMS OR QUESTIONS [phone])  **PLEASE NOTE** ALL MEDICATION REFILL REQUESTS (INCLUDING CONTROLLED SUBSTANCES) NEED TO BE MADE AT LEAST 7 DAYS PRIOR TO REFILL BEING DUE. ANY REFILL REQUESTS INSIDE THAT TIME FRAME MAY RESULT IN DELAYS IN RECEIVING YOUR PRESCRIPTION.    ICE to left chest wall as needed for pain. Icy Hot with Lidocaine to chest wall   I think a cardiology evaluation is appropriate to assess his fatigue and lower extremity edema.

## 2022-04-21 NOTE — Progress Notes (Signed)
Subjective:    Patient ID: Anthony Downs, male    DOB: [birth year], 72 y.o.   MRN: [id number]  HPI  Anthony Downs is here in follow up of his meningiomas/tetraplegia.  He has beenworking with physical therapy but really has reached a plateau.  He has ongoing weakness and just has not progressed.  They are concerned that perhaps something else neurologically is going on.   However ,he has had left chest/side wall pain since coming down with RSV last December. It has persisted although better than it was initially.  He has taken Tylenol for pain with limited success.  He also came down with COVID.  A recent CT was performed which revealed: 1. No acute intrathoracic process. 2. 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 No acute bony abnormalities to explain the patient's right rib and upper back pain. 3. Aortic Atherosclerosis (ICD10-I70.0). Coronary artery atherosclerosis.  His wife also was sick over this time.  He really was unable to progress substantially because of his illness and other things going on at the house.  He still has a to help with his personal care.  I asked him what he felt short of breath or had chest pain.  He did deny this.  Wife states that he does still like to sleep a lot.  He feels that he just does not have a lot of energy still.  He continues to deal with lower extremity edema.  He is on a diuretic.  He remains in wheelchair for primary means of mobility.  He needs a Engineer, drilling for transfers.  He has not seen cardiology for some time apparently.  Pain Inventory Average Pain 0 Pain Right Now 0 My pain is  n/a  LOCATION OF PAIN  n/a  BOWEL Number of stools per week: 3 Oral laxative use No  Type of laxative n/a Enema or suppository use No  History of colostomy No  Incontinent No   BLADDER Normal In and out cath, frequency n/a Able to self cath  n/a Bladder incontinence No  Frequent urination No  Leakage with coughing No   Difficulty starting stream No  Incomplete bladder emptying No    Mobility use a wheelchair Do you have any goals in this area?  yes  Function retired I need assistance with the following:  dressing, bathing, toileting, and meal prep  Neuro/Psych weakness tremor trouble walking  Prior Studies Any changes since last visit?  yes  Physicians involved in your care Follow-up   Family History  Problem Relation Age of Onset   Breast cancer Mother    Pancreatic cancer Mother    Aortic aneurysm Father    Social History   Socioeconomic History   Marital status: Married    Spouse name: Not on file   Number of children: 2   Years of education: 12   Highest education level: High school graduate  Occupational History   Occupation: Retired  Tobacco Use   Smoking status: Never   Smokeless tobacco: Never  Vaping Use   Vaping Use: Never used  Substance and Sexual Activity   Alcohol use: No    Comment: no use since 2008   Drug use: No   Sexual activity: Yes  Other Topics Concern   Not on file  Social History Narrative   Lives with wife.   Right-handed.   Two cans Anthony Downs daily.   Social Determinants of Health   Financial Resource Strain: Not on file  Food Insecurity:  Not on file  Transportation Needs: Not on file  Physical Activity: Not on file  Stress: Not on file  Social Connections: Not on file   Past Surgical History:  Procedure Laterality Date   APPLICATION OF CRANIAL NAVIGATION Left 04/29/2020   Procedure: [place];  Surgeon: Anthony Part, MD;  Location: [facility];  Service: Neurosurgery;  Laterality: Left;   arthroscopic knee     COLONOSCOPY N/A 11/29/2018   Procedure: COLONOSCOPY;  Surgeon: Anthony Houston, MD;  [facility];  Service: Endoscopy;  Laterality: N/A;  730-rescheduled 9/2 same time per Ann   CRANIOTOMY Left 04/29/2020   Procedure: LEFT CRANIECTOMY WITH TUMOR EXCISION;  Surgeon: Anthony Part,  MD;  Location: [facility];  Service: Neurosurgery;  Laterality: Left;   KNEE SURGERY Right    NASAL SEPTOPLASTY W/ TURBINOPLASTY Bilateral 03/19/2020   Procedure: NASAL SEPTOPLASTY WITH BILATERAL  TURBINATE REDUCTION;  Surgeon: Anthony Baptist, MD;  Location: [facility];  Service: ENT;  Laterality: Bilateral;   VASECTOMY     Past Medical History:  Diagnosis Date   Acid reflux    Anxiety    Arthritis    Asthma    as a child   Basal cell carcinoma 01/26/2021   RIGHT POST SURICULAR INFERIOR   Basal cell carcinoma 01/26/2021   RIGHT POST AURICULAR SUPERIOR   Benign brain tumor ([place])    Cancer ([place])    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, squamous behind ear   Complication of anesthesia    slow to wake up and pain medicines make him sick   GERD (gastroesophageal reflux disease)    Hypercholesteremia    Hypertension    PONV (postoperative nausea and vomiting)    Sleep apnea    no Cpap   Subdural hematoma, 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  Opioid Risk Score:   Fall Risk Score:  `1  Depression screen PHQ 2/9     12/16/2021    2:12 PM 09/09/2021    2:43 PM 03/04/2021    2:35 PM 11/26/2020    2:00 PM 09/17/2020    2:39 PM 09/17/2020    1:18 PM  Depression screen PHQ 2/9  Decreased Interest 0 0 0 0 3 1  Down, Depressed, Hopeless 0 0 0 0 2 1  PHQ - 2 Score 0 0 0 0 5 2  Altered sleeping     3   Tired, decreased energy     3   Change in appetite     3   Feeling bad or failure about yourself      0   Trouble concentrating     3   Moving slowly or fidgety/restless     3   Suicidal thoughts     0   PHQ-9 Score     20      Review of Systems  Constitutional:  Positive for unexpected weight change.  Respiratory:  Positive for cough.        Respiratory infection  Endocrine:       Night sweats  All other systems reviewed and are negative.     Objective:   Physical Exam  General: No acute distress, obese HEENT: NCAT,  EOMI, oral membranes moist Cards: reg rate  Chest: normal effort Abdomen: Soft, NT, ND Skin: dry, intact Extremities: 2-3+ LE edema Psych: pleasant and appropriate  Skin: intact  Neuro:very alert and oriented. Very attentive and engaging. betterinsight and awareness. Intentional tremor R>L UE's. Low frequency.  Resting tone 1/4 RUE and 1/4 with tight heel cord improved.  Upper extremity strength grossly  4 out of 5 on left, RUE 3/5. LLE 3-/5. RLE 1 to 1+/5    --really no change.     Reflexes are still 3+. Intentional tremor in UE R>L still present Musculoskeletal:  good sitting posture         Assessment & Plan:  1. 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 ?cv component  -will hold off on therapy for now. 2.  Left posterior tibial vein DVT:   now on coumadin             -check venous dopplers 3.  Sleep disorder             -continue trazodone 50mg  qhs 4.  Neurogenic bowel:             - suppository or dig stim on toilet if needed 5.  Urinary frequency with incontinence: frequent UTI's            -per urology            -flomax             -he's now continent! 6.Seizures: depakote 500 mg qd             - will come off of in February next year? 7.Tremor: sinemet trial failed. Hold propranolol for now for asthmatic bronchitis 8. Swelling:              -TEDS vs zip up compressing stockings vs velcro compression stockings were discussed             -otherwise keep feet elevated when possible.    -cards consult  -dopplers as above 30+ minutes of face to face patient care time were spent during this visit. All questions were encouraged and answered. Follow up with me in 4 mos.

## 2022-04-23 ENCOUNTER — Ambulatory Visit: Payer: Medicare Other | Admitting: Physical Therapy

## 2022-04-27 ENCOUNTER — Encounter: Payer: Self-pay | Admitting: Neurology

## 2022-04-28 ENCOUNTER — Ambulatory Visit: Payer: Medicare Other | Admitting: Physical Therapy

## 2022-04-28 ENCOUNTER — Other Ambulatory Visit (HOSPITAL_COMMUNITY): Payer: Self-pay | Admitting: Internal Medicine

## 2022-04-28 DIAGNOSIS — R7989 Other specified abnormal findings of blood chemistry: Secondary | ICD-10-CM

## 2022-04-30 ENCOUNTER — Ambulatory Visit: Payer: Medicare Other | Admitting: Physical Therapy

## 2022-04-30 ENCOUNTER — Encounter: Payer: Self-pay | Admitting: Physical Medicine & Rehabilitation

## 2022-05-05 ENCOUNTER — Ambulatory Visit: Payer: Medicare Other | Admitting: Physical Therapy

## 2022-05-06 ENCOUNTER — Encounter: Payer: Self-pay | Admitting: Cardiology

## 2022-05-06 ENCOUNTER — Ambulatory Visit: Payer: Medicare Other | Attending: Cardiology | Admitting: Cardiology

## 2022-05-06 VITALS — BP 134/82 | HR 68 | Ht 73.0 in

## 2022-05-06 DIAGNOSIS — E782 Mixed hyperlipidemia: Secondary | ICD-10-CM | POA: Diagnosis not present

## 2022-05-06 DIAGNOSIS — R0789 Other chest pain: Secondary | ICD-10-CM | POA: Diagnosis not present

## 2022-05-06 DIAGNOSIS — Z7901 Long term (current) use of anticoagulants: Secondary | ICD-10-CM | POA: Insufficient documentation

## 2022-05-06 DIAGNOSIS — I2510 Atherosclerotic heart disease of native coronary artery without angina pectoris: Secondary | ICD-10-CM | POA: Insufficient documentation

## 2022-05-06 NOTE — Patient Instructions (Signed)
Medication Instructions:  Your physician recommends that you continue on your current medications as directed. Please refer to the Current Medication list given to you today.   Labwork: None  Testing/Procedures: None  Follow-Up: Follow up with Dr. Branch in 1 year.   Any Other Special Instructions Will Be Listed Below (If Applicable).     If you need a refill on your cardiac medications before your next appointment, please call your pharmacy.  
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
## 2022-05-07 ENCOUNTER — Ambulatory Visit: Payer: Medicare Other | Admitting: Physical Therapy

## 2022-05-11 ENCOUNTER — Ambulatory Visit: Payer: Medicare Other | Admitting: Cardiology

## 2022-05-12 ENCOUNTER — Ambulatory Visit (HOSPITAL_COMMUNITY)
Admission: RE | Admit: 2022-05-12 | Discharge: 2022-05-12 | Disposition: A | Discharge status: Home / Self Care | Payer: Medicare Other | Source: Ambulatory Visit | Attending: Physical Medicine & Rehabilitation | Admitting: Physical Medicine & Rehabilitation

## 2022-05-12 ENCOUNTER — Ambulatory Visit: Payer: Medicare Other | Admitting: Cardiology

## 2022-05-12 ENCOUNTER — Ambulatory Visit (HOSPITAL_COMMUNITY)
Admission: RE | Admit: 2022-05-12 | Discharge: 2022-05-12 | Disposition: A | Discharge status: Home / Self Care | Payer: Medicare Other | Source: Ambulatory Visit | Attending: Internal Medicine | Admitting: Internal Medicine

## 2022-05-12 ENCOUNTER — Ambulatory Visit: Payer: Medicare Other | Admitting: Physical Therapy

## 2022-05-12 DIAGNOSIS — R600 Localized edema: Secondary | ICD-10-CM | POA: Diagnosis present

## 2022-05-12 DIAGNOSIS — R7989 Other specified abnormal findings of blood chemistry: Secondary | ICD-10-CM | POA: Insufficient documentation

## 2022-05-14 ENCOUNTER — Ambulatory Visit: Payer: Medicare Other | Admitting: Physical Therapy

## 2022-05-19 ENCOUNTER — Ambulatory Visit: Payer: Medicare Other | Admitting: Physical Therapy

## 2022-05-21 ENCOUNTER — Ambulatory Visit: Payer: Medicare Other | Admitting: Physical Therapy

## 2022-06-27 ENCOUNTER — Emergency Department (HOSPITAL_COMMUNITY)
Admission: EM | Admit: 2022-06-27 | Discharge: 2022-06-27 | Disposition: A | Discharge status: Home / Self Care | Payer: Medicare Other | Attending: Emergency Medicine | Admitting: Emergency Medicine

## 2022-06-27 ENCOUNTER — Encounter (HOSPITAL_COMMUNITY): Payer: Self-pay

## 2022-06-27 ENCOUNTER — Emergency Department (HOSPITAL_COMMUNITY): Payer: Medicare Other

## 2022-06-27 ENCOUNTER — Other Ambulatory Visit: Payer: Self-pay

## 2022-06-27 DIAGNOSIS — R600 Localized edema: Secondary | ICD-10-CM | POA: Diagnosis present

## 2022-06-27 DIAGNOSIS — Z7901 Long term (current) use of anticoagulants: Secondary | ICD-10-CM | POA: Insufficient documentation

## 2022-06-27 LAB — CBC WITH DIFFERENTIAL/PLATELET
Abs Immature Granulocytes: 0.04 10*3/uL (ref 0.00–0.07)
Basophils Absolute: 0 10*3/uL (ref 0.0–0.1)
Basophils Relative: 1 %
Eosinophils Absolute: 0.2 10*3/uL (ref 0.0–0.5)
Eosinophils Relative: 4 %
HCT: 37.5 % — ABNORMAL LOW (ref 39.0–52.0)
Hemoglobin: 12 g/dL — ABNORMAL LOW (ref 13.0–17.0)
Immature Granulocytes: 1 %
Lymphocytes Relative: 37 %
Lymphs Abs: 2.1 10*3/uL (ref 0.7–4.0)
MCH: 30.2 pg (ref 26.0–34.0)
MCHC: 32 g/dL (ref 30.0–36.0)
MCV: 94.5 fL (ref 80.0–100.0)
Monocytes Absolute: 0.4 10*3/uL (ref 0.1–1.0)
Monocytes Relative: 7 %
Neutro Abs: 2.8 10*3/uL (ref 1.7–7.7)
Neutrophils Relative %: 50 %
Platelets: 143 10*3/uL — ABNORMAL LOW (ref 150–400)
RBC: 3.97 MIL/uL — ABNORMAL LOW (ref 4.22–5.81)
RDW: 14.3 % (ref 11.5–15.5)
WBC: 5.6 10*3/uL (ref 4.0–10.5)
nRBC: 0 % (ref 0.0–0.2)

## 2022-06-27 LAB — COMPREHENSIVE METABOLIC PANEL
ALT: 47 U/L — ABNORMAL HIGH (ref 0–44)
AST: 50 U/L — ABNORMAL HIGH (ref 15–41)
Albumin: 3.2 g/dL — ABNORMAL LOW (ref 3.5–5.0)
Alkaline Phosphatase: 86 U/L (ref 38–126)
Anion gap: 8 (ref 5–15)
BUN: 12 mg/dL (ref 8–23)
CO2: 25 mmol/L (ref 22–32)
Calcium: 8.6 mg/dL — ABNORMAL LOW (ref 8.9–10.3)
Chloride: 106 mmol/L (ref 98–111)
Creatinine, Ser: 0.87 mg/dL (ref 0.61–1.24)
GFR, Estimated: >60 mL/min (ref >60)
Glucose, Bld: 166 mg/dL — ABNORMAL HIGH (ref 70–99)
Potassium: 4 mmol/L (ref 3.5–5.1)
Sodium: 139 mmol/L (ref 135–145)
Total Bilirubin: 0.7 mg/dL (ref 0.3–1.2)
Total Protein: 6.1 g/dL — ABNORMAL LOW (ref 6.5–8.1)

## 2022-06-27 LAB — URINALYSIS, ROUTINE W REFLEX MICROSCOPIC
Bilirubin Urine: NEGATIVE
Glucose, UA: NEGATIVE mg/dL
Hgb urine dipstick: NEGATIVE
Ketones, ur: NEGATIVE mg/dL
Leukocytes,Ua: NEGATIVE
Nitrite: NEGATIVE
Protein, ur: NEGATIVE mg/dL
Specific Gravity, Urine: 1.009 (ref 1.005–1.030)
pH: 6 (ref 5.0–8.0)

## 2022-06-27 LAB — BRAIN NATRIURETIC PEPTIDE: B Natriuretic Peptide: 70 pg/mL (ref 0.0–100.0)

## 2022-06-27 LAB — PROTIME-INR
INR: 1.1 (ref 0.8–1.2)
Prothrombin Time: 14.2 seconds (ref 11.4–15.2)

## 2022-06-27 MED ORDER — FUROSEMIDE 40 MG PO TABS: 40.0000 mg | ORAL_TABLET | Freq: Two times a day (BID) | ORAL | 0 refills | Status: DC

## 2022-06-27 MED ORDER — FUROSEMIDE 10 MG/ML IJ SOLN
40.0000 mg | INTRAMUSCULAR | Status: AC
  Administered 2022-06-27: 40 mg via INTRAVENOUS
  Filled 2022-06-27: qty 4

## 2022-06-27 NOTE — Discharge Instructions (Addendum)
Your testing has been reassuring  I want you to increase the dose of furosemide to 40 mg twice a day for the next 7 days, then you will need to have your family doctor recheck you, I want you to return to the ER if you are developing severe or worsening swelling or difficulty breathing

## 2022-06-27 NOTE — ED Triage Notes (Signed)
Patient complains of swelling in his extremities. He called medics today because he has been urinating himself. He is concerned about the swelling but says it has been present for the last couple years

## 2022-06-27 NOTE — ED Provider Notes (Signed)
[name] AT [facility] Provider Note   CSN: [id number] Arrival date & time: 06/27/22  0935     History  No chief complaint on file.   Anthony Downs is a 72 y.o. male.  HPI   This patient has a medical history complicated by having high cholesterol, acid reflux, he is on Coumadin, he had brain surgery for a meningioma removal in April 2022.  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  The patient tells me that he has been had swollen legs for a couple of years, reports he has not been told why his legs are swollen but is told he has not had any heart failure or liver failure or kidney failure.  He is also noticed a little bit of increasing swelling in his right arm over the last few days.  Paramedics noted normal vital signs.  The patient has not missed any of his daily medications.  The patient has had bilateral lower extremity DVT studies performed in April 2024, there is no evidence of DVT in either extremity at that time.  Home Medications Prior to Admission medications   Medication Sig Start Date End Date Taking? Authorizing Provider  albuterol (VENTOLIN HFA) 108 (90 Base) MCG/ACT inhaler Inhale 2 puffs into the lungs every 4 (four) hours. 11/12/21   [provider]  atorvastatin (LIPITOR) 10 MG tablet Take 1 tablet (10 mg total) by mouth at bedtime. 06/17/20   Love, Evlyn Kanner, PA-C  cetirizine (ZYRTEC) 10 MG tablet Take 10 mg by mouth daily.    [provider]  cyanocobalamin (VITAMIN B12) 1000 MCG  tablet Take 1,000 mcg by mouth daily.    [provider]  divalproex (DEPAKOTE) 500 MG DR tablet Take 1 tablet (500 mg total) by mouth 2 (two) times daily. Patient taking differently: Take 500 mg by mouth once. One tablet at night 02/26/21 05/06/22  Windell Norfolk, MD  finasteride (PROSCAR) 5 MG tablet Take 1 tablet (5 mg total) by mouth daily. 02/01/22   Jerilee Field, MD  furosemide (LASIX) 40 MG tablet Take 1 tablet (40 mg total) by mouth 2 (two) times daily for 7 days. 06/27/22 07/04/22  Eber Hong, MD  pantoprazole (PROTONIX) 40 MG tablet Take 1 tablet (40 mg total) by mouth daily. 06/17/20   Love, Evlyn Kanner, PA-C  potassium chloride SA (KLOR-CON) 20 MEQ tablet Take 1 tablet (20 mEq total) by mouth daily. 06/17/20   Love, Evlyn Kanner, PA-C  propranolol (INDERAL) 20 MG tablet Take 1 tablet (20 mg total) by mouth 3 (three) times daily. Patient taking differently: Take 20 mg by mouth daily. 06/17/20   Love, Evlyn Kanner, PA-C  Propylene Glycol-Glycerin (SOOTHE OP) Apply 1 drop to eye daily as needed (dry eyes).    [provider]  tamsulosin (FLOMAX) 0.4 MG CAPS capsule Take 2 capsules (0.8 mg total) by mouth at bedtime. 02/01/22 02/01/23  Jerilee Field, MD  thiamine 100 MG tablet Take 1 tablet (100 mg total) by mouth  daily. 07/16/20   Hollice Espy, MD  warfarin (COUMADIN) 5 MG tablet Take 5 mg by mouth daily. 02/26/22   [provider]  zinc gluconate 50 MG tablet Take 50 mg by mouth daily.    [provider]      Allergies    Ativan [lorazepam] and Haldol [haloperidol]    Review of Systems   Review of Systems  All other systems reviewed and are negative.   Physical Exam Updated Vital Signs BP 134/86   Pulse 72   Temp 97.6 F (36.4 C) (Oral)   Resp 18   Ht 1.854 m (6\' 1" )   Wt 131 kg   SpO2 98%   BMI 38.10 kg/m  Physical Exam Vitals and nursing note reviewed.  Constitutional:      General: He is not in acute distress.    Appearance: He is  well-developed.  HENT:     Head: Normocephalic and atraumatic.     Mouth/Throat:     Pharynx: No oropharyngeal exudate.  Eyes:     General: No scleral icterus.       Right eye: No discharge.        Left eye: No discharge.     Conjunctiva/sclera: Conjunctivae normal.     Pupils: Pupils are equal, round, and reactive to light.  Neck:     Thyroid: No thyromegaly.     Vascular: No JVD.  Cardiovascular:     Rate and Rhythm: Normal rate and regular rhythm.     Heart sounds: Normal heart sounds. No murmur heard.    No friction rub. No gallop.  Pulmonary:     Effort: Pulmonary effort is normal. No respiratory distress.     Breath sounds: Normal breath sounds. No wheezing or rales.  Abdominal:     General: Bowel sounds are normal. There is no distension.     Palpations: Abdomen is soft. There is no mass.     Tenderness: There is no abdominal tenderness.  Musculoskeletal:        General: No tenderness. Normal range of motion.     Cervical back: Normal range of motion and neck supple.     Right lower leg: Edema present.     Left lower leg: Edema present.     Comments: There is some weakness in all 4 extremities, and has symmetrical pitting edema of the bilateral lower extremities below the knees, there is also some swelling of the right upper extremity  Lymphadenopathy:     Cervical: No cervical adenopathy.  Skin:    General: Skin is warm and dry.     Findings: No erythema or rash.  Neurological:     Mental Status: He is alert.     Coordination: Coordination normal.     Comments: Awake alert, cranial nerves III through XII appear normal, mild diffuse weakness including bilateral legs, there is some right upper extremity tremor which is baseline, cranial nerves III through XII appear normal  Psychiatric:        Behavior: Behavior normal.     ED Results / Procedures / Treatments   Labs (all labs ordered are listed, but only abnormal results are displayed) Labs Reviewed   COMPREHENSIVE METABOLIC PANEL - Abnormal; Notable for the following components:      Result Value   Glucose, Bld 166 (*)    Calcium 8.6 (*)    Total Protein 6.1 (*)    Albumin 3.2 (*)    AST 50 (*)  ALT 47 (*)    All other components within normal limits  CBC WITH DIFFERENTIAL/PLATELET - Abnormal; Notable for the following components:   RBC 3.97 (*)    Hemoglobin 12.0 (*)    HCT 37.5 (*)    Platelets 143 (*)    All other components within normal limits  BRAIN NATRIURETIC PEPTIDE  URINALYSIS, ROUTINE W REFLEX MICROSCOPIC  PROTIME-INR    EKG EKG Interpretation  Date/Time:  Sunday June 27 2022 10:04:56 EDT Ventricular Rate:  66 PR Interval:  177 QRS Duration: 98 QT Interval:  424 QTC Calculation: 445 R Axis:   55 Text Interpretation: Sinus rhythm Ventricular premature complex Low voltage, precordial leads Confirmed by Eber Hong ([id number]) on 06/27/2022 10:16:03 AM  Radiology DG Chest Port 1 View  Result Date: 06/27/2022 CLINICAL DATA:  Shortness of breath EXAM: PORTABLE CHEST 1 VIEW COMPARISON:  01/27/2022 FINDINGS: Cardiac shadow is enlarged but stable. The lungs are well aerated bilaterally. No focal infiltrate or effusion is seen. No bony abnormality is noted. IMPRESSION: No acute abnormality noted. Electronically Signed   By: Alcide Clever M.D.   On: 06/27/2022 10:22    Procedures Procedures    Medications Ordered in ED Medications  furosemide (LASIX) injection 40 mg (40 mg Intravenous Given 06/27/22 1005)    ED Course/ Medical Decision Making/ A&P Clinical Course as of 06/27/22 1141  Sun Jun 27, 2022  1035 Additional history obtained from the spouse who arrives stating that he has seemed a little bit more swollen than usual.  This includes both legs and his right arm though she does report that the swelling has been chronic for over a year.  He has not had any coughing or fevers, he did appear little more short of breath when he was laying down last night, the  patient is not short of breath at this time and has normal vital signs [BM]    Clinical Course User Index [BM] Eber Hong, MD                             Medical Decision Making Amount and/or Complexity of Data Reviewed Labs: ordered. Radiology: ordered. ECG/medicine tests: ordered.  Risk Prescription drug management.    This patient presents to the ED for concern of edema, this involves an extensive number of treatment options, and is a complaint that carries with it a high risk of complications and morbidity.  The differential diagnosis includes heart failure liver failure renal failure, immobility, obesity, vascular insufficiency, DVT or PE but that seems much less likely especially given normal vital signs   Co morbidities that complicate the patient evaluation  Significant edema much worsened after brain surgery   Additional history obtained:  Additional history obtained from medical record External records from outside source obtained and reviewed including prior labs including 3 months ago where liver function testing showed normal bilirubin normal albumin, normal liver function.  A BNP from 6 months ago was 51, CBC from 6 months ago was normal without anemia   Lab Tests:  I Ordered, and personally interpreted labs.  The pertinent results include: CBC metabolic panel, BNP unremarkable   Imaging Studies ordered:  I ordered imaging studies including chest x-ray I independently visualized and interpreted imaging which showed no edema I agree with the radiologist interpretation   Cardiac Monitoring: / EKG:  The patient was maintained on a cardiac monitor.  I personally viewed and interpreted the cardiac monitored which showed  an underlying rhythm of: Normal sinus rhythm   Problem List / ED Course / Critical interventions / Medication management  Peripheral edema does not seem to be related to liver or kidney failure or heart failure, reassurance was given to  the patient and his significant other, they are in agreement to increase the dose of Lasix for the next week and follow-up in the outpatient setting I ordered medication including furosemide for edema Reevaluation of the patient after these medicines showed that the patient improved with regards to urinary output I have reviewed the patients home medicines and have made adjustments as needed   Social Determinants of Health:  Prior brain tumor, relatively immobile   Test / Admission - Considered:  Considered admission but no signs of organ dysfunction, this appears to be benign type of edema and can be treated with changing doses of medications   I have discussed with the patient at the bedside the results, and the meaning of these results.  They have expressed her understanding to the need for follow-up with primary care physician         Final Clinical Impression(s) / ED Diagnoses Final diagnoses:  Peripheral edema    Rx / DC Orders ED Discharge Orders          Ordered    furosemide (LASIX) 40 MG tablet  2 times daily        06/27/22 1141              Eber Hong, MD 06/27/22 1141

## 2022-07-21 ENCOUNTER — Encounter: Payer: Self-pay | Admitting: Physical Medicine & Rehabilitation

## 2022-07-21 ENCOUNTER — Encounter: Payer: Medicare Other | Attending: Physical Medicine & Rehabilitation | Admitting: Physical Medicine & Rehabilitation

## 2022-07-21 VITALS — BP 127/82 | HR 62

## 2022-07-21 DIAGNOSIS — D496 Neoplasm of unspecified behavior of brain: Secondary | ICD-10-CM | POA: Diagnosis present

## 2022-07-21 DIAGNOSIS — G4733 Obstructive sleep apnea (adult) (pediatric): Secondary | ICD-10-CM

## 2022-07-21 DIAGNOSIS — G8250 Quadriplegia, unspecified: Secondary | ICD-10-CM | POA: Diagnosis present

## 2022-07-21 NOTE — Progress Notes (Signed)
Subjective:    Patient ID: Anthony Downs, male    DOB: [birth year], 72 y.o.   MRN: [id number]  HPI  Anthony Downs is here in follow up of his tetraplegia. He is frustrated that he's not getting any stronger. His wife has been giving him more protein which may have helped his energy levels. He also started wearing his CPAP for the last 2 weeks or so. It's been a struggle to keep it on, however as it's falling off frequently.   He had been working a few months ago with outpatient therapies but this was ultimately halted as he really had plateaued and was getting a lot out of the therapist himself's.  It was decided to try to work on edema control and basic things at home.  Since then he really has not done a whole lot.  He recently was in the emergency department for increased limb edema.  His Lasix was adjusted and he is now is using Lasix essentially daily at home.  Is voiding on his own and edema has generally improved.Marland Kitchen  He can be frustrated at times over his lack of progress but overall his wife states that his mood has been generally upbeat.  The patient himself tends to agree.   Pain Inventory Average Pain 0 Pain Right Now 0 My pain is  no pain  LOCATION OF PAIN  no pain  BOWEL Number of stools per week: 3-4 Oral laxative use No  Type of laxative . Enema or suppository use No  History of colostomy No  Incontinent No   BLADDER Normal In and out cath, frequency . Able to self cath  . Bladder incontinence No  Frequent urination No  Leakage with coughing No  Difficulty starting stream No  Incomplete bladder emptying No    Mobility ability to climb steps?  no do you drive?  no use a wheelchair needs help with transfers  Function what is your job? 2008 I need assistance with the following:  dressing, bathing, toileting, meal prep, and household duties  Neuro/Psych weakness tremor trouble walking  Prior Studies Any changes since last visit?   no  Physicians involved in your care Any changes since last visit?  no   Family History  Problem Relation Age of Onset   Breast cancer Mother    Pancreatic cancer Mother    Aortic aneurysm Father    Social History   Socioeconomic History   Marital status: Married    Spouse name: Not on file   Number of children: 2   Years of education: 12   Highest education level: High school graduate  Occupational History   Occupation: Retired  Tobacco Use   Smoking status: Never   Smokeless tobacco: Never  Vaping Use   Vaping Use: Never used  Substance and Sexual Activity   Alcohol use: No    Comment: no use since 2008   Drug use: No   Sexual activity: Yes  Other Topics Concern   Not on file  Social History Narrative   Lives with wife.   Right-handed.   Two cans Dr. Reino Kent daily.   Social Determinants of Health   Financial Resource Strain: Not on file  Food Insecurity: Not on file  Transportation Needs: Not on file  Physical Activity: Not on file  Stress: Not on file  Social Connections: Not on file   Past Surgical History:  Procedure Laterality Date   APPLICATION OF CRANIAL NAVIGATION Left 04/29/2020   Procedure: APPLICATION  OF CRANIAL NAVIGATION;  Surgeon: Jadene Pierini, MD;  Location: [facility] OR;  Service: Neurosurgery;  Laterality: Left;   arthroscopic knee     COLONOSCOPY N/A 11/29/2018   Procedure: COLONOSCOPY;  Surgeon: Malissa Hippo, MD;  Location: [facility];  Service: Endoscopy;  Laterality: N/A;  730-rescheduled 9/2 same time per Ann   CRANIOTOMY Left 04/29/2020   Procedure: LEFT CRANIECTOMY WITH TUMOR EXCISION;  Surgeon: Jadene Pierini, MD;  Location: [facility] OR;  Service: Neurosurgery;  Laterality: Left;   KNEE SURGERY Right    NASAL SEPTOPLASTY W/ TURBINOPLASTY Bilateral 03/19/2020   Procedure: NASAL SEPTOPLASTY WITH BILATERAL  TURBINATE REDUCTION;  Surgeon: Newman Pies, MD;  Location: [facility] OR;  Service: ENT;  Laterality: Bilateral;   VASECTOMY     Past  Medical History:  Diagnosis Date   Acid reflux    Anxiety    Arthritis    Asthma    as a child   Basal cell carcinoma 01/26/2021   RIGHT POST SURICULAR INFERIOR   Basal cell carcinoma 01/26/2021   RIGHT POST AURICULAR SUPERIOR   Benign brain tumor (HCC)    Cancer (HCC)    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, squamous behind ear   Complication of anesthesia    slow to wake up and pain medicines make him sick   GERD (gastroesophageal reflux disease)    Hypercholesteremia    Hypertension    PONV (postoperative nausea and vomiting)    Sleep apnea    no Cpap   Subdural hematoma, post-traumatic (HCC) 2008   fell off truck hit head on concrete   BP 127/82   Pulse 62   SpO2 98%   Opioid Risk Score:   Fall Risk Score:  `1  Depression screen PHQ 2/9     04/21/2022    2:13 PM 12/16/2021    2:12 PM 09/09/2021    2:43 PM 03/04/2021    2:35 PM 11/26/2020    2:00 PM 09/17/2020    2:39 PM 09/17/2020    1:18 PM  Depression screen PHQ 2/9  Decreased Interest 0 0 0 0 0 3 1  Down, Depressed, Hopeless 0 0 0 0 0 2 1  PHQ - 2 Score 0 0 0 0 0 5 2  Altered sleeping      3   Tired, decreased energy      3   Change in appetite      3   Feeling bad or failure about yourself       0   Trouble concentrating      3   Moving slowly or fidgety/restless      3   Suicidal thoughts      0   PHQ-9 Score      20       Review of Systems  All other systems reviewed and are negative.     Objective:   Physical Exam General: No acute distress HEENT: NCAT, EOMI, oral membranes moist Cards: reg rate  Chest: normal effort Abdomen: Soft, NT, ND Skin: dry, intact Extremities: 1-2+ LE.  Psych: pleasant and appropriate  Skin: intact Neuro:very alert and oriented. Very attentive and engaging. Very verbal and insightful. Intentional tremor R>L UE's. Low frequency.  Upper extremity strength grossly  4 out of 5 on left, RUE 3/5 with tremor. LLE 3-/5. RLE tr to 1/5    --weaker RLE?     Reflexes are still 3+.  Intentional tremor in UE R>L still present Musculoskeletal:  good sitting posture         Assessment & Plan:  1. 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  Left posterior tibial vein DVT:   now on coumadin             -check venous dopplers 3.  Sleep disorder/OSA             -needs to work on getting acclimated to unit 4.  Neurogenic bowel:             - suppository or dig stim on toilet if needed 5.  Urinary frequency with incontinence: frequent UTI's            -per urology            -flomax             -he's continent 6.Seizures: depakote 500 mg qd             - will come off of in February 2025? 7.Tremor: propranolol only daily 8. Swelling:              -lasix per primary  -TEDS, elevation  30 minutes of face to face patient care time were spent during this visit. All questions were encouraged and answered. Follow up with me in 3 months

## 2022-07-21 NOTE — Patient Instructions (Signed)
KEEP TRYING PERSEVERE THROUGH YOUR CPAP TRIAL. THE LONGER YOU WEAR IT, THE MORE YOU'LL GET ACCUSTOMED TO HAVING IT ON!

## 2022-08-19 ENCOUNTER — Other Ambulatory Visit: Payer: Self-pay

## 2022-08-19 ENCOUNTER — Encounter (HOSPITAL_COMMUNITY): Payer: Self-pay

## 2022-08-19 ENCOUNTER — Emergency Department (HOSPITAL_COMMUNITY): Payer: Medicare Other

## 2022-08-19 ENCOUNTER — Emergency Department (HOSPITAL_COMMUNITY)
Admission: EM | Admit: 2022-08-19 | Discharge: 2022-08-19 | Disposition: A | Discharge status: Home / Self Care | Payer: Medicare Other | Attending: Student | Admitting: Student

## 2022-08-19 DIAGNOSIS — Z7982 Long term (current) use of aspirin: Secondary | ICD-10-CM | POA: Diagnosis not present

## 2022-08-19 DIAGNOSIS — I493 Ventricular premature depolarization: Secondary | ICD-10-CM | POA: Insufficient documentation

## 2022-08-19 DIAGNOSIS — R001 Bradycardia, unspecified: Secondary | ICD-10-CM | POA: Diagnosis present

## 2022-08-19 DIAGNOSIS — R7989 Other specified abnormal findings of blood chemistry: Secondary | ICD-10-CM | POA: Diagnosis not present

## 2022-08-19 LAB — COMPREHENSIVE METABOLIC PANEL
ALT: 38 U/L (ref 0–44)
AST: 32 U/L (ref 15–41)
Albumin: 3 g/dL — ABNORMAL LOW (ref 3.5–5.0)
Alkaline Phosphatase: 82 U/L (ref 38–126)
Anion gap: 6 (ref 5–15)
BUN: 12 mg/dL (ref 8–23)
CO2: 22 mmol/L (ref 22–32)
Calcium: 7.8 mg/dL — ABNORMAL LOW (ref 8.9–10.3)
Chloride: 108 mmol/L (ref 98–111)
Creatinine, Ser: 0.91 mg/dL (ref 0.61–1.24)
GFR, Estimated: >60 mL/min (ref >60)
Glucose, Bld: 137 mg/dL — ABNORMAL HIGH (ref 70–99)
Potassium: 3.6 mmol/L (ref 3.5–5.1)
Sodium: 136 mmol/L (ref 135–145)
Total Bilirubin: 0.5 mg/dL (ref 0.3–1.2)
Total Protein: 5.8 g/dL — ABNORMAL LOW (ref 6.5–8.1)

## 2022-08-19 LAB — CBC
HCT: 41.6 % (ref 39.0–52.0)
Hemoglobin: 13.3 g/dL (ref 13.0–17.0)
MCH: 30.4 pg (ref 26.0–34.0)
MCHC: 32 g/dL (ref 30.0–36.0)
MCV: 95 fL (ref 80.0–100.0)
Platelets: 162 10*3/uL (ref 150–400)
RBC: 4.38 MIL/uL (ref 4.22–5.81)
RDW: 13.8 % (ref 11.5–15.5)
WBC: 8.6 10*3/uL (ref 4.0–10.5)
nRBC: 0 % (ref 0.0–0.2)

## 2022-08-19 LAB — T4, FREE: Free T4: 0.95 ng/dL (ref 0.61–1.12)

## 2022-08-19 LAB — TSH: TSH: 4.643 u[IU]/mL — ABNORMAL HIGH (ref 0.350–4.500)

## 2022-08-19 LAB — MAGNESIUM: Magnesium: 1.8 mg/dL (ref 1.7–2.4)

## 2022-08-19 LAB — TROPONIN I (HIGH SENSITIVITY)
Troponin I (High Sensitivity): 4 ng/L (ref <18)
Troponin I (High Sensitivity): 5 ng/L (ref <18)

## 2022-08-19 NOTE — ED Triage Notes (Signed)
[facility] called out by PT for episodes of bradycardia (39 HR) with weakness. Pt denies CP and per EMS pt HR stayed in the 70s while being transported. EKG shows a 2nd degree heart block.

## 2022-08-19 NOTE — ED Provider Notes (Signed)
[place] EMERGENCY DEPARTMENT AT [facility] Provider Note   CSN: [id number] Arrival date & time: 08/19/22  1335     History  Chief Complaint  Patient presents with   Weakness    Anthony Downs is a 72 y.o. male history of brain tumor with tetraplegia, seizures, DVT presented with bradycardia.  Patient was at home and PT was evaluating him when they noticed his heart rate was in the 30s and that he was noted to be weak.  Patient states that PT evaluated him after having a "good "bowel movement and that is why he was bradycardic as he was very relaxed.  Patient denies any chest pain, shortness of breath, Donnell pain, vision changes, head pain, neck pain, change in sensation/motor skills.  Patient states he has a tremor at baseline along with baseline weakness from his brain tumor.  Patient is not on chemo or radiation as he had the tumor removed.  No new medication changes in the past 3 weeks.   Home Medications Prior to Admission medications   Medication Sig Start Date End Date Taking? Authorizing Provider  albuterol (VENTOLIN HFA) 108 (90 Base) MCG/ACT inhaler Inhale 2 puffs into the lungs every 4 (four) hours. 11/12/21   [provider]  aspirin EC 81 MG tablet Take 81 mg by mouth daily. Swallow whole.    [provider]  atorvastatin (LIPITOR) 10 MG tablet Take 1 tablet (10 mg total) by mouth at bedtime. 06/17/20   Love, Evlyn Kanner, PA-C  cetirizine (ZYRTEC) 10 MG tablet Take 10 mg by mouth daily.    [provider]  cyanocobalamin (VITAMIN B12) 1000 MCG tablet Take 1,000 mcg by mouth daily.    [provider]  divalproex (DEPAKOTE) 500 MG DR tablet Take 1 tablet (500 mg total) by mouth 2 (two) times daily. Patient taking differently: Take 500 mg by mouth once. One tablet at night 02/26/21 05/06/22  Windell Norfolk, MD  finasteride (PROSCAR) 5 MG tablet Take 1 tablet (5 mg total) by mouth daily. 02/01/22   Jerilee Field, MD  furosemide  (LASIX) 40 MG tablet Take 1 tablet (40 mg total) by mouth 2 (two) times daily for 7 days. 06/27/22 07/04/22  Eber Hong, MD  glimepiride (AMARYL) 2 MG tablet Take 2 mg by mouth daily with breakfast.    [provider]  pantoprazole (PROTONIX) 40 MG tablet Take 1 tablet (40 mg total) by mouth daily. 06/17/20   Love, Evlyn Kanner, PA-C  potassium chloride SA (KLOR-CON) 20 MEQ tablet Take 1 tablet (20 mEq total) by mouth daily. 06/17/20   Love, Evlyn Kanner, PA-C  propranolol (INDERAL) 20 MG tablet Take 1 tablet (20 mg total) by mouth 3 (three) times daily. Patient taking differently: Take 20 mg by mouth daily. 06/17/20   Love, Evlyn Kanner, PA-C  Propylene Glycol-Glycerin (SOOTHE OP) Apply 1 drop to eye daily as needed (dry eyes).    [provider]  tamsulosin (FLOMAX) 0.4 MG CAPS capsule Take 2 capsules (0.8 mg total) by mouth at bedtime. 02/01/22 02/01/23  Jerilee Field, MD  thiamine 100 MG tablet Take 1 tablet (100 mg total) by mouth daily. 07/16/20   Hollice Espy, MD  zinc gluconate 50 MG tablet Take 50 mg by mouth daily.    [provider]      Allergies    Ativan [lorazepam] and Haldol [haloperidol]    Review of Systems   Review of Systems  Neurological:  Positive for weakness.  Physical Exam Updated Vital Signs BP 129/61 (BP Location: Left Arm)   Pulse 84   Temp 98.4 F (36.9 C) (Oral)   Resp 20   Ht 6\' 1"  (1.854 m)   Wt 131 kg   SpO2 94%   BMI 38.10 kg/m  Physical Exam Constitutional:      General: He is not in acute distress. Eyes:     Extraocular Movements: Extraocular movements intact.     Conjunctiva/sclera: Conjunctivae normal.     Pupils: Pupils are equal, round, and reactive to light.  Cardiovascular:     Rate and Rhythm: Bradycardia present. Rhythm irregular.     Heart sounds: Normal heart sounds.     Comments: Bilateral radial pulses with ventricular bigeminy Pulmonary:     Effort: Pulmonary effort is normal. No respiratory distress.      Breath sounds: Normal breath sounds.  Abdominal:     Palpations: Abdomen is soft.     Tenderness: There is no abdominal tenderness. There is no guarding or rebound.  Musculoskeletal:     Right lower leg: No edema.     Left lower leg: No edema.  Skin:    General: Skin is warm and dry.     Comments: No overlying skin color changes  Neurological:     Mental Status: He is alert.     Comments: Essential tremor however this is baseline, 2 out of 5 right hip flexion which is baseline, 4-5 right hip flexion which is baseline Equal symmetrical smile     ED Results / Procedures / Treatments   Labs (all labs ordered are listed, but only abnormal results are displayed) Labs Reviewed  TSH - Abnormal; Notable for the following components:      Result Value   TSH 4.643 (*)    All other components within normal limits  COMPREHENSIVE METABOLIC PANEL - Abnormal; Notable for the following components:   Glucose, Bld 137 (*)    Calcium 7.8 (*)    Total Protein 5.8 (*)    Albumin 3.0 (*)    All other components within normal limits  CBC  MAGNESIUM  T4, FREE  TROPONIN I (HIGH SENSITIVITY)  TROPONIN I (HIGH SENSITIVITY)    EKG None  Radiology DG Chest Port 1 View  Result Date: 08/19/2022 CLINICAL DATA:  bradycardia EXAM: PORTABLE CHEST 1 VIEW COMPARISON:  CXR 06/27/22 FINDINGS: Cardiomegaly. No pleural effusion. No pneumothorax. No focal airspace opacity. No radiographically apparent displaced rib fractures. Visualized upper abdomen is unremarkable. IMPRESSION: Cardiomegaly.  No focal airspace opacity. Electronically Signed   By: Lorenza Cambridge M.D.   On: 08/19/2022 14:08    Procedures Procedures    Medications Ordered in ED Medications - No data to display  ED Course/ Medical Decision Making/ A&P                                 Medical Decision Making Amount and/or Complexity of Data Reviewed Labs: ordered. Radiology: ordered.   Mammie Lorenzo 72 y.o. presented today for  bradycardia. Working DDx that I considered at this time includes, but not limited to, sinus bradycardia, SA block, sick sinus node, AV block, hypothyroid, electrolyte abnormalities, anemia.  R/o DDx: sinus bradycardia, SA block, sick sinus node, AV block, electrolyte abnormalities, anemia: These are considered less likely due to history of present illness and physical exam findings  Review of prior external notes: 06/27/2022 ED provider  Unique Tests and  My Interpretation:  Chest x-ray: Cardiomegaly CBC: Unremarkable CMP: Hypocalcemia 7.8 Troponin: 4, 5 Magnesium: 1.8 TSH: Slightly elevated from baseline 4.643 EKG: Sinus 69 bpm, with PACs noted, no ST elevations or depressions noted  Discussion with Independent Historian: None  Discussion of Management of Tests:  Branch, MD Cardiology  Risk: Low: based on diagnostic testing/clinical impression and treatment plan  Risk Stratification Score: None  Staffed with Kommor, MD  Plan: On exam patient was in no acute distress however was bradycardic with a pulse rate of 39 in the room.  Patient blood pressure was good along with the rest of his vitals.  Patient's physical exam was at baseline with no new changes as per the patient.  Patient's EKG did appear to be different than previous which could be contributing to his bradycardia.  Cardiology will be consulted to help interpret the EKG.  Patient stable this time.  Cardiology was consulted and stated that due to the PVCs this would be throwing off the machine and reading it as bradycardic.  Cardiology stated to add on a mag and that he would come see him in the ED.  If cardiologist does not reach back out the plan will be to discharge with outpatient follow-up with patient's labs and workup is reassuring.  On recheck patient was still asymptomatic.  Patient's labs came back reassuring along with a negative delta troponin.  I spoke to the patient and his wife and at this time patient is stable  to be discharged with cardiology follow-up.  Spoke to the patient about how his TSH is slightly elevated and that he will need to follow-up on the T4 via the MyChart app and his primary care provider.  Patient was given return precautions. Patient stable for discharge at this time.  Patient verbalized understanding of plan.         Final Clinical Impression(s) / ED Diagnoses Final diagnoses:  PVC (premature ventricular contraction)  Elevated TSH    Rx / DC Orders ED Discharge Orders          Ordered    Ambulatory referral to Cardiology       Comments: If you have not heard from the Cardiology office within the next 72 hours please call [phone].   08/19/22 1656              Netta Corrigan, PA-C 08/19/22 1658    Kommor, Madison, MD 08/19/22 2312

## 2022-08-19 NOTE — Discharge Instructions (Addendum)
Please make an appoint with her cardiologist regarding recent symptoms and ER visit.  Today your labs and imaging were reassuring however your low heart rate is most likely a missed read by the machine as you are having PVCs or muscle spasm like contractions in your ventricles causing the machine to not be able to read your heart rate correctly.  Your TSH was also noted to be slightly elevated and so you need to follow-up with your primary care provider and use the MyChart app to follow-up on your T4 results to see if you possibly have hypothyroid contributing to symptoms.  If symptoms change or worsen please return to ER.

## 2022-08-20 ENCOUNTER — Other Ambulatory Visit: Payer: Self-pay | Admitting: Neurology

## 2022-08-27 ENCOUNTER — Telehealth: Payer: Self-pay | Admitting: *Deleted

## 2022-08-27 NOTE — Telephone Encounter (Signed)
Transition Care Management Follow-up Telephone Call Date of discharge and from where: [facility] 08/19/2022 How have you been since you were released from the hospital? Much better  Any questions or concerns? No  Items Reviewed: Did the pt receive and understand the discharge instructions provided? Yes  Medications obtained and verified? No  Other? No  Any new allergies since your discharge? No  Dietary orders reviewed? No Do you have support at home? Yes      Follow up appointments reviewed:   [facility]t confirmed? Yes  Scheduled to see cardiologist next week  Are transportation arrangements needed? Yes   has his own Zenaida Niece for transport If their condition worsens, is the pt aware to call PCP or go to the Emergency Dept.? Yes Was the patient provided with contact information for the PCP's office or ED? Yes Was to pt encouraged to call back with questions or concerns? Yes

## 2022-09-08 ENCOUNTER — Ambulatory Visit: Payer: Medicare Other | Attending: Physician Assistant

## 2022-09-08 ENCOUNTER — Ambulatory Visit: Payer: Medicare Other | Attending: Physician Assistant | Admitting: Physician Assistant

## 2022-09-08 ENCOUNTER — Encounter: Payer: Self-pay | Admitting: Physician Assistant

## 2022-09-08 VITALS — BP 124/80 | HR 77 | Ht 73.0 in

## 2022-09-08 DIAGNOSIS — I493 Ventricular premature depolarization: Secondary | ICD-10-CM | POA: Diagnosis not present

## 2022-09-08 DIAGNOSIS — E785 Hyperlipidemia, unspecified: Secondary | ICD-10-CM

## 2022-09-08 DIAGNOSIS — I10 Essential (primary) hypertension: Secondary | ICD-10-CM | POA: Diagnosis not present

## 2022-09-08 DIAGNOSIS — I2510 Atherosclerotic heart disease of native coronary artery without angina pectoris: Secondary | ICD-10-CM

## 2022-09-08 NOTE — Progress Notes (Signed)
Cardiology Office Note    Date:  09/08/2022  ID:  Anthony, Downs [birth year], MRN [id number] PCP:  Elfredia Nevins, MD  Cardiologist:  Dina Rich, MD  Electrophysiologist:  None   Chief Complaint: evaluate PVCs  History of Present Illness: .    Anthony Downs is a 72 y.o. male with visit-pertinent history of atypical chest pain, aortic and coronary atherosclerosis on CT, lung nodule seen on prior stress test not seen on f/u CT 03/2022, HLD, chronic edema, HTN, OSA, DM2, DVT (previously on Coumadin managed by PCP), brain mass s/p resection, prior seizure, incomplete tetraplegia, frequent UTIs, tremors treated with propranolol seen for ER follow-up.   Per Dr Anthony Downs notes he had 09/2019 [facility] stress test limited by arm movement, no clear ischemia and echo showing LVEF 60-65%, indet diastolic, normal RV.  Prior chest CT 03/2022 incidentally noted coronary atherosclerosis. He was previously on Coumadin at the direction of PCP for DVT but wife states this was stopped after repeat dopplers were negative. He was switched back to baby ASA. He requires use of a motor chair for mobility. He was recently seen in the ED for concern for bradycardia with HR in the 30s by home pulse ox. In the ED he was found to have PVCs possibly contributing to pseudobradycardia. OP cardiac f/u was recommended. He denies any awareness of these. No CP, new SOB, palpitations, syncope. He also has h/o LE edema which required brief increase in dosing earlier this summer with relative return to baseline. Wife clarifies dosing on Lasix (20mg  BID), KCl ( BID), and propranolol (20mg  daily previously decreased from TID due to wheezing).   Labwork independently reviewed: 08/2022 Mg 1.8, trops neg, TSH mildly elev but FT4 wnl, K 3.6, Cr 0.91, alb 3.0, AST ALT OK, CBC wnl - recheck TSH 3.93 with repeat FT4 wnl 2018 LDL 66  ROS: .    Please see the history of present illness.  All other systems are reviewed and  otherwise negative.  Studies Reviewed: Marland Kitchen    EKG:  EKG is ordered today, personally reviewed, demonstrating NSR 77bpm, occasional PVCs, QTc  CV Studies: Cardiac studies reviewed are outlined and summarized above. Otherwise please see EMR for full report.   Echo: 10/12/19 [facility] Summary 1. The left ventricle is normal in size with normal wall thickness. 2. The left ventricular systolic function is normal, LVEF is visually estimated at 60-65%. 3. The left atrium is mildly dilated in size. 4. The right ventricle is normal in size, with normal systolic function. Pulmonary systolic pressure cannot be estimated due to insufficient TR jet.      09/2019 stress test [facility] Stress test: 09/2019 IMPRESSIONS: - Equivocal myocardial perfusion study due to marked patient arm movement - There is a moderate sized, mildly severe, nearly completely reversible perfusion defect involving the apical inferior, mid inferior and basal inferior segments. This is consistent with possible artifact (attenuation, motion and misregistration of attenuation CT) but, cannot rule out ischemia. - LV ejection fraction at rest was 55%. - No significant coronary calcifications were noted on the attenuation CT - Incidentally noted on the attenuation CT scan is a 8 mm peripheral right lung nodule along with an incompletely visualized right upper lobe density (possibly artifact from motion) - Recommend dedicated full chest CT to evaluate lung nodules and given no or limited coronary calcium could consider coronary CTA     Current Reported Medications:.    Current Meds  Medication Sig   albuterol (VENTOLIN  HFA) 108 (90 Base) MCG/ACT inhaler Inhale 2 puffs into the lungs every 4 (four) hours.   aspirin EC 81 MG tablet Take 81 mg by mouth daily. Swallow whole.   atorvastatin (LIPITOR) 10 MG tablet Take 1 tablet (10 mg total) by mouth at bedtime.   cetirizine (ZYRTEC) 10 MG tablet Take 10 mg by mouth daily.   cyanocobalamin  (VITAMIN B12) 1000 MCG tablet Take 1,000 mcg by mouth daily.   divalproex (DEPAKOTE) 500 MG DR tablet TAKE 1 TABLET BY MOUTH TWICE A DAY (Patient taking differently: Take 500 mg by mouth daily.)   finasteride (PROSCAR) 5 MG tablet Take 1 tablet (5 mg total) by mouth daily.   furosemide (LASIX) 20 MG tablet Take 20 mg by mouth 2 (two) times daily.   glimepiride (AMARYL) 2 MG tablet Take 2 mg by mouth daily with breakfast.   pantoprazole (PROTONIX) 40 MG tablet Take 1 tablet (40 mg total) by mouth daily.   potassium chloride SA (KLOR-CON M) 20 MEQ tablet Take 20 mEq by mouth 2 (two) times daily.   propranolol (INDERAL) 20 MG tablet Take 20 mg by mouth daily.   Propylene Glycol-Glycerin (SOOTHE OP) Apply 1 drop to eye daily as needed (dry eyes).   tamsulosin (FLOMAX) 0.4 MG CAPS capsule Take 2 capsules (0.8 mg total) by mouth at bedtime.   thiamine 100 MG tablet Take 1 tablet (100 mg total) by mouth daily.   Vitamin D, Ergocalciferol, (DRISDOL) 1.25 MG (50000 UNIT) CAPS capsule Take 50,000 Units by mouth once a week.   zinc gluconate 50 MG tablet Take 50 mg by mouth daily.   [DISCONTINUED] furosemide (LASIX) 40 MG tablet Take 1 tablet (40 mg total) by mouth 2 (two) times daily for 7 days.    Physical Exam:    VS:  BP 124/80   Pulse 77   Ht 6\' 1"  (1.854 m)   SpO2 96%   BMI 38.10 kg/m    Wt Readings from Last 3 Encounters:  08/19/22 288 lb 12.8 oz (131 kg)  06/27/22 288 lb 12.8 oz (131 kg)  02/01/22 290 lb (131.5 kg)    GEN: Well nourished, well developed in no acute distress, in motorized wheelchair NECK: No JVD; No carotid bruits CARDIAC: RRR except with sporadic ectopy, no murmurs, rubs, gallops RESPIRATORY:  Clear to auscultation without rales, wheezing or rhonchi  ABDOMEN: Soft, non-tender, non-distended EXTREMITIES:  Mild BLE edema; No acute deformity  NEURO: A+Ox3, wife also contributes to hx  Asessement and Plan:.    1. 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 Will arrange 3 day Zio to evaluate PVC burden and update echo. Continue propranolol at current dose, on this predominantly for tremors. If testing is reassuring with low PVC burden and preserved LVEF, conservative management is appropriate. Recent K/Mg were slightly below goal though previously normal. Will have him return on a day when lab is open to recheck (goal K 4.0 or greater, Mg 2.0 or greater).   2. Coronary atherosclerosis on CT, HLD - no recent anginal symptoms. Now on ASA since Coumadin stopped per wife. Will defer lipid management to PCP; our team does not appear to be managing these.  3. Essential HTN - controlled, no new changes made today.  4. Lower extremity edema - continue Lasix and f/u electrolytes and echo as above.     Disposition: F/u with me in 6-8 weeks.  Signed, Laurann Montana, PA-C

## 2022-09-08 NOTE — Patient Instructions (Signed)
Medication Instructions:   Your physician recommends that you continue on your current medications as directed. Please refer to the Current Medication list given to you today.  *If you need a refill on your cardiac medications before your next appointment, please call your pharmacy*   Lab Work: Your physician recommends that you return for lab work. ( BMET, Magnesium)    If you have labs (blood work) drawn today and your tests are completely normal, you will receive your results only by: MyChart Message (if you have MyChart) OR A paper copy in the mail If you have any lab test that is abnormal or we need to change your treatment, we will call you to review the results.   Testing/Procedures: Your physician has requested that you have an echocardiogram. Echocardiography is a painless test that uses sound waves to create images of your heart. It provides your doctor with information about the size and shape of your heart and how well your heart's chambers and valves are working. This procedure takes approximately one hour. There are no restrictions for this procedure. Please do NOT wear cologne, perfume, aftershave, or lotions (deodorant is allowed). Please arrive 15 minutes prior to your appointment time.  ZIO XT- Long Term Monitor Instructions   Your physician has requested you wear your ZIO patch monitor_3_days.   This is a single patch monitor.  [employer] supplies one patch monitor per enrollment.  Additional stickers are not available.   Please do not apply patch if you will be having a Nuclear Stress Test, Echocardiogram, Cardiac CT, MRI, or Chest Xray during the time frame you would be wearing the monitor. The patch cannot be worn during these tests.  You cannot remove and re-apply the ZIO XT patch monitor.   Your ZIO patch monitor will be sent USPS Priority mail from [facility] directly to your home address. The monitor may also be mailed to a PO BOX if home delivery is not  available.   It may take 3-5 days to receive your monitor after you have been enrolled.   Once you have received you monitor, please review enclosed instructions.  Your monitor has already been registered assigning a specific monitor serial # to you.   Applying the monitor   Shave hair from upper left chest.   Hold abrader disc by orange tab.  Rub abrader in 40 strokes over left upper chest as indicated in your monitor instructions.   Clean area with 4 enclosed alcohol pads .  Use all pads to assure are is cleaned thoroughly.  Let dry.   Apply patch as indicated in monitor instructions.  Patch will be place under collarbone on left side of chest with arrow pointing upward.   Rub patch adhesive wings for 2 minutes.Remove white label marked "1".  Remove white label marked "2".  Rub patch adhesive wings for 2 additional minutes.   While looking in a mirror, press and release button in center of patch.  A small green light will flash 3-4 times .  This will be your only indicator the monitor has been turned on.     Do not shower for the first 24 hours.  You may shower after the first 24 hours.   Press button if you feel a symptom. You will hear a small click.  Record Date, Time and Symptom in the Patient Log Book.   When you are ready to remove patch, follow instructions on last 2 pages of Patient Log Book.  Stick patch  monitor onto last page of Patient Log Book.   Place Patient Log Book in [place] box.  Use locking tab on box and tape box closed securely.  The [employer] and [employer] has [employer] on it.  Please place in mailbox as soon as possible.  Your physician should have your test results approximately 7 days after the monitor has been mailed back to [facility].   Call [facility] Customer Care at [phone] if you have questions regarding your ZIO XT patch monitor.  Call them immediately if you see an orange light blinking on your monitor.   If your monitor falls off in less  than 4 days contact our Monitor department at [phone].  If your monitor becomes loose or falls off after 4 days call [employer] at [phone] for suggestions on securing your monitor.     Follow-Up: At [facility], you and your health needs are our priority.  As part of our continuing mission to provide you with exceptional heart care, we have created designated Provider Care Teams.  These Care Teams include your primary Cardiologist (physician) and Advanced Practice Providers (APPs -  Physician Assistants and Nurse Practitioners) who all work together to provide you with the care you need, when you need it.  We recommend signing up for the patient portal called "MyChart".  Sign up information is provided on this After Visit Summary.  MyChart is used to connect with patients for Virtual Visits (Telemedicine).  Patients are able to view lab/test results, encounter notes, upcoming appointments, etc.  Non-urgent messages can be sent to your provider as well.   To learn more about what you can do with MyChart, go to [email or web address].    Your next appointment:   6-8 week(s)  Provider:   Ronie Spies, PA   Other Instructions Thank you for choosing [facility]!

## 2022-09-14 ENCOUNTER — Other Ambulatory Visit (HOSPITAL_COMMUNITY)
Admission: RE | Admit: 2022-09-14 | Discharge: 2022-09-14 | Disposition: A | Discharge status: Home / Self Care | Payer: Medicare Other | Source: Ambulatory Visit | Attending: Physician Assistant | Admitting: Physician Assistant

## 2022-09-14 ENCOUNTER — Telehealth (HOSPITAL_BASED_OUTPATIENT_CLINIC_OR_DEPARTMENT_OTHER): Payer: Self-pay

## 2022-09-14 DIAGNOSIS — I493 Ventricular premature depolarization: Secondary | ICD-10-CM | POA: Insufficient documentation

## 2022-09-14 DIAGNOSIS — I10 Essential (primary) hypertension: Secondary | ICD-10-CM | POA: Insufficient documentation

## 2022-09-14 LAB — BASIC METABOLIC PANEL
Anion gap: 6 (ref 5–15)
BUN: 11 mg/dL (ref 8–23)
CO2: 28 mmol/L (ref 22–32)
Calcium: 8.6 mg/dL — ABNORMAL LOW (ref 8.9–10.3)
Chloride: 104 mmol/L (ref 98–111)
Creatinine, Ser: 0.84 mg/dL (ref 0.61–1.24)
GFR, Estimated: >60 mL/min (ref >60)
Glucose, Bld: 175 mg/dL — ABNORMAL HIGH (ref 70–99)
Potassium: 4.1 mmol/L (ref 3.5–5.1)
Sodium: 138 mmol/L (ref 135–145)

## 2022-09-14 LAB — MAGNESIUM: Magnesium: 1.9 mg/dL (ref 1.7–2.4)

## 2022-09-14 MED ORDER — MAGNESIUM OXIDE -MG SUPPLEMENT 400 (240 MG) MG PO TABS: 400.0000 mg | ORAL_TABLET | Freq: Every day | ORAL | 3 refills | Status: AC

## 2022-09-14 NOTE — Telephone Encounter (Signed)
Patient notified and verbalized understanding. Pt asked for prescription of Mag-Ox 400 mg tablets be sent into the pharmacy for insurance coverage. Pt had no further questions or concerns at this time. PCP copied.

## 2022-09-14 NOTE — Telephone Encounter (Signed)
-----   Message from Laurann Montana sent at 09/14/2022  1:07 PM EDT ----- Labs done due to h/o PVCs. Let them know potassium is normal. Magnesium just slightly below goal of 2.0. Would advise starting rx MagOx 400mg  daily or OTC magnesium glycinate 200mg  daily. If he does not wish to add any new medicines, would increase dietary intake of healthy sources of magnesium including greens, nuts, seeds, fish, beans, whole grains, avocados, yogurt unless allergic to these things. Blood sugar is up but has known diabetes so not acutely worried for this, can continue usual f/u with provider managing his diabetes. Thank you.

## 2022-09-19 IMAGING — MR MR HEAD WO/W CM
13 of 15 series · 38 of 48 positions shown · IV contrast (gadavist)
Comparison: Prior brain MRI examinations 08/14/2020 and earlier.
Head CT 08/12/2020.

CLINICAL DATA: Provided history: Brain tumor.

EXAM:
MRI HEAD WITHOUT AND WITH CONTRAST
TECHNIQUE: Multiplanar, multiecho pulse sequences of the brain and surrounding
structures were obtained without and with intravenous contrast.
CONTRAST:  10mL GADAVIST GADOBUTROL 1 MMOL/ML IV SOLN

[Series 5: DWI · axial · 3.0mm · 0.88mm/px · z∈[-86,+54]mm · 3 of 48 slices shown (1 of 4)]
[im 1/48]
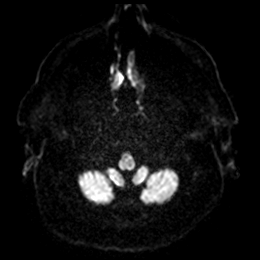
[im 24/48]
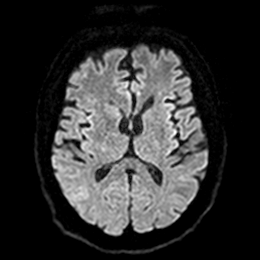
[im 48/48]
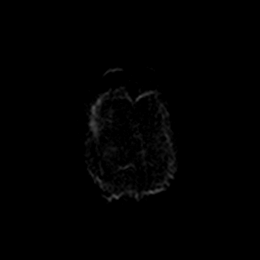

[Series 6: DWI · axial · 3.0mm · 0.88mm/px · z∈[-86,+54]mm · 3 of 48 slices shown (2 of 4)]
[im 1/48]
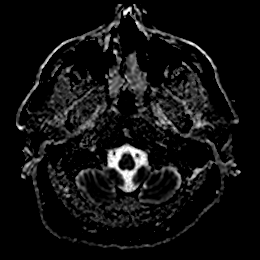
[im 24/48]
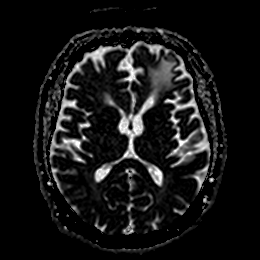
[im 48/48]
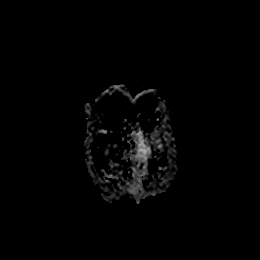

[Series 7: DWI · coronal · 4.0mm · 0.88mm/px · 2 of 32 slices shown (3 of 4)]
[im 1/32]
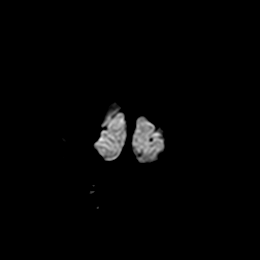
[im 32/32]
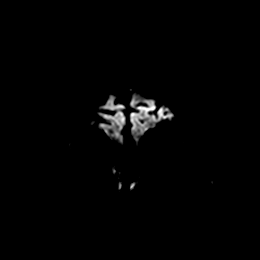

[Series 8: DWI · coronal · 4.0mm · 0.88mm/px · 3 of 32 slices shown (4 of 4)]
[im 1/32]
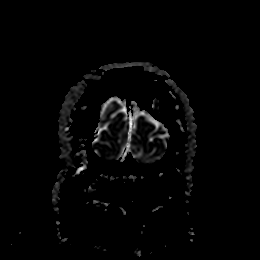
[im 16/32]
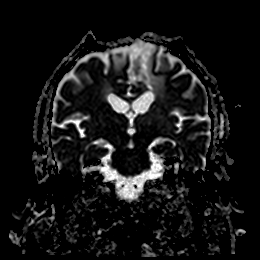
[im 32/32]
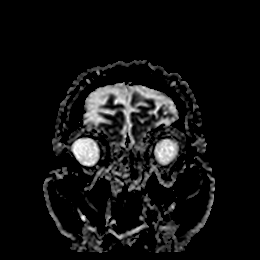

[Series 9: T1 · sagittal · 5.0mm · 0.75mm/px · 2 of 23 slices shown]
[im 1/23]
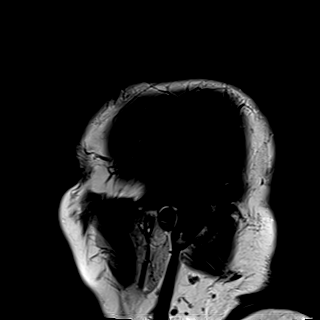
[im 23/23]
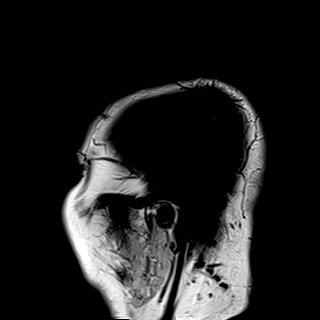

[Series 10: T2 · axial · 5.0mm · 0.72mm/px · z∈[-87,+55]mm · 2 of 25 slices shown]
[im 1/25]
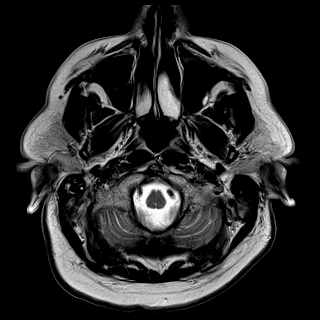
[im 25/25]
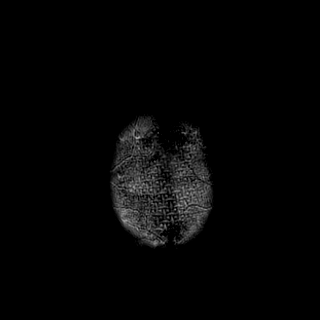

[Series 11: FLAIR · axial · 5.0mm · 0.45mm/px · z∈[-86,+57]mm · 2 of 25 slices shown]
[im 1/25]
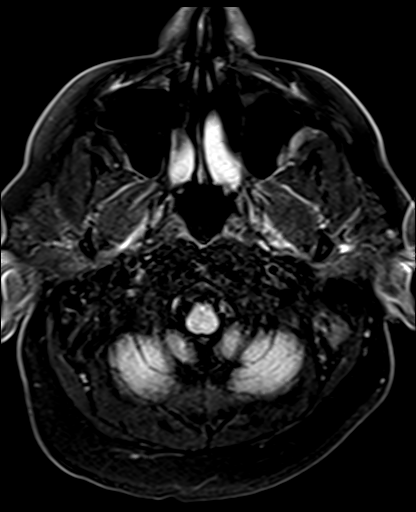
[im 25/25]
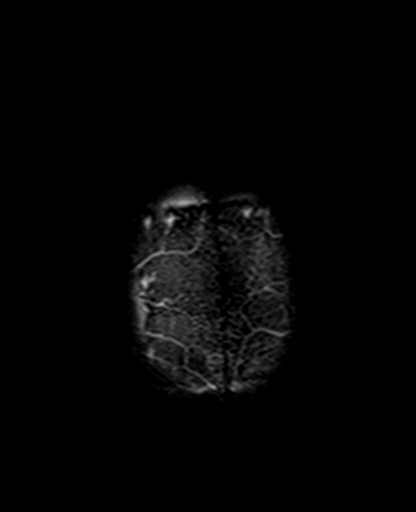

[Series 12: mag_images · axial · 3.0mm · 0.90mm/px · z∈[-96,+68]mm · 5 of 56 slices shown]
[im 1/56]
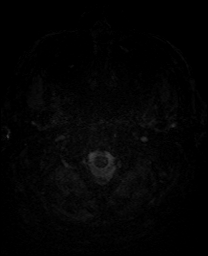
[im 14/56]
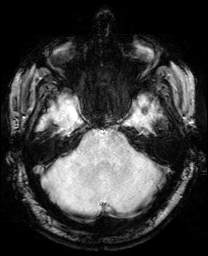
[im 28/56]
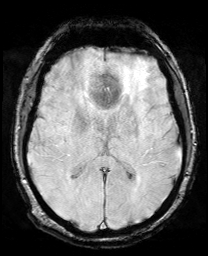
[im 42/56]
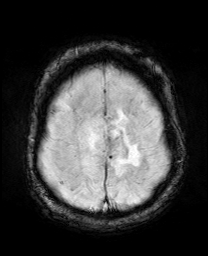
[im 56/56]
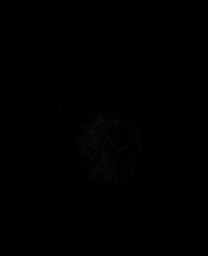

[Series 13: pha_images · axial · 3.0mm · 0.90mm/px · z∈[-96,+68]mm · 5 of 56 slices shown]
[im 1/56]
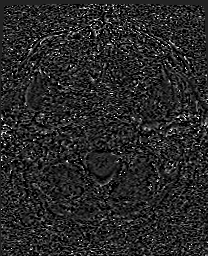
[im 14/56]
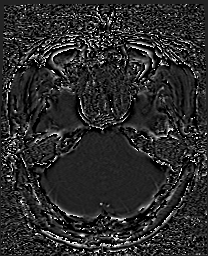
[im 28/56]
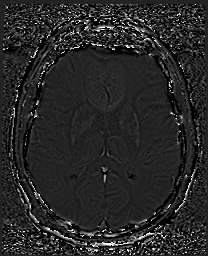
[im 42/56]
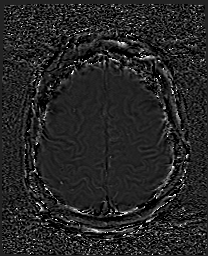
[im 56/56]
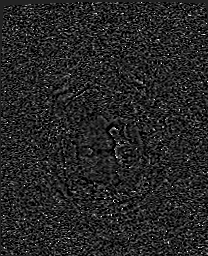

[Series 14: swi_images · axial · 3.0mm · 0.90mm/px · z∈[-96,+68]mm · 5 of 56 slices shown]
[im 1/56]
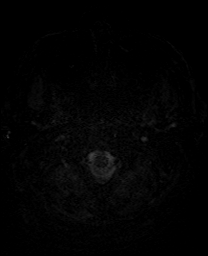
[im 14/56]
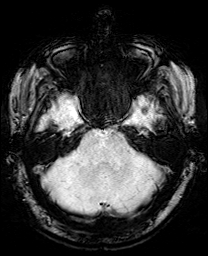
[im 28/56]
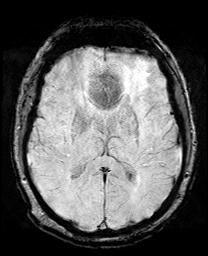
[im 42/56]
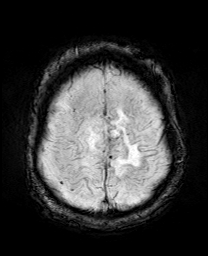
[im 56/56]
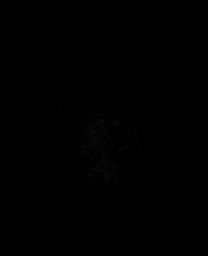

[Series 17: T2 post-contrast · coronal · 5.0mm · 0.72mm/px · 2 of 28 slices shown]
[im 1/28]
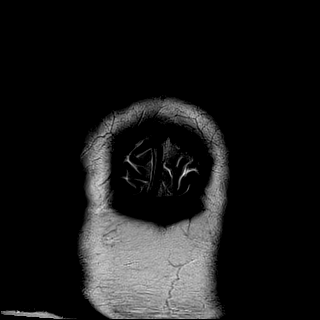
[im 28/28]
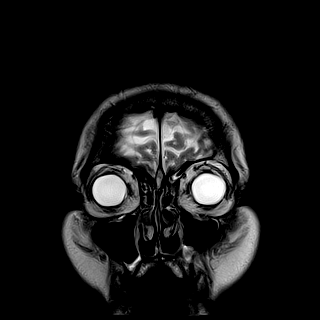

[Series 19: T1 post-contrast · coronal · 5.0mm · 0.34mm/px · 2 of 28 slices shown (1 of 2)]
[im 1/28]
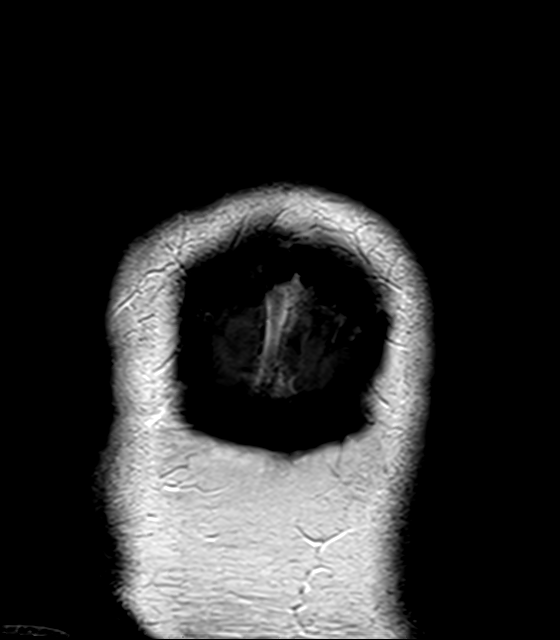
[im 28/28]
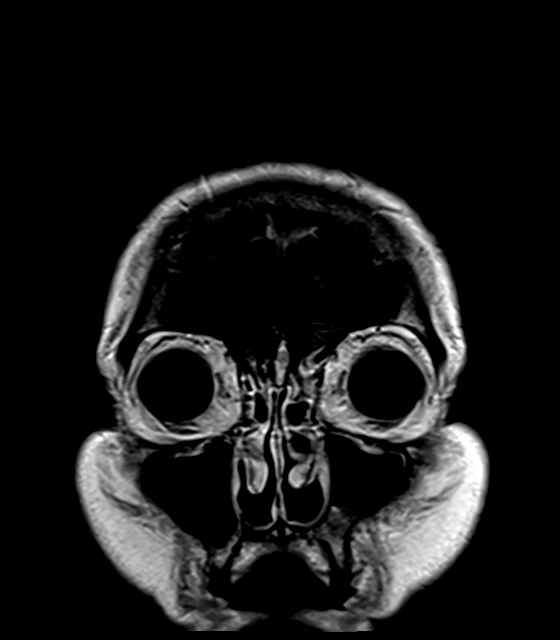

[Series 20: T1 post-contrast · sagittal · 5.0mm · 0.72mm/px · 2 of 23 slices shown (2 of 2)]
[im 1/23]
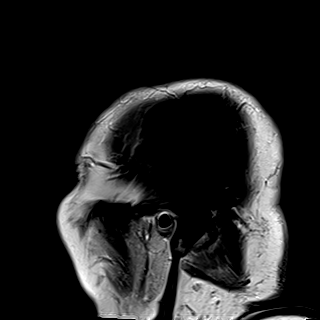
[im 23/23]
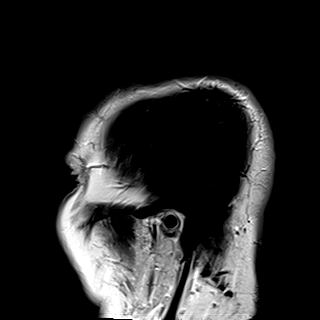

[38 of 48 positions shown; findings below may reference images not displayed]

FINDINGS: Brain:

Mild generalized parenchymal atrophy.

Redemonstrated postoperative changes from prior vertex
craniotomy/cranioplasty and parafalcine meningioma resection with
partial resection of the superior sagittal sinus. Redemonstrated
foci of chronic encephalomalacia/gliosis and minimal hemosiderin
deposition within the medial frontal lobes, at the resection site.
No definite masslike or nodular enhancement appreciated at the
resection site to suggest residual/recurrent tumor.

Redemonstrated foci of chronic encephalomalacia/gliosis within the
anteroinferior left frontal lobe and anterior left temporal lobe,
likely posttraumatic in etiology.

Background mild multifocal T2 FLAIR hyperintense signal abnormality
within the cerebral white matter, nonspecific but compatible with
chronic small vessel ischemic disease.

There is no acute infarct.

No extra-axial fluid collection.

No midline shift.

Vascular: Prior partial resection of the superior sagittal sinus.
Flow voids preserved within the proximal large arterial vessels.

Skull and upper cervical spine: No focal suspicious marrow lesion.
Vertex cranioplasty.

Sinuses/Orbits: No mass or acute finding within the imaged orbits.
Minimal mucosal thickening scattered within the bilateral ethmoid
sinuses. Small fluid level within the left sphenoid sinus. Mild
mucosal thickening, and small mucous retention cysts, within the
right maxillary sinus. Mild mucosal thickening within the left
maxillary sinus.

Other: Trace fluid within the right mastoid air cells.
IMPRESSION: Redemonstrated sequela of prior vertex craniotomy/cranioplasty and
parafalcine meningioma resection with partial resection of the
superior sagittal sinus. No evidence of residual/recurrent tumor at
the resection site.

Chronic intracranial findings without interval change, as described.

Paranasal sinus disease, as outlined.

## 2022-09-28 ENCOUNTER — Ambulatory Visit: Payer: Medicare Other | Attending: Physician Assistant

## 2022-09-28 DIAGNOSIS — I493 Ventricular premature depolarization: Secondary | ICD-10-CM | POA: Diagnosis present

## 2022-09-28 DIAGNOSIS — I10 Essential (primary) hypertension: Secondary | ICD-10-CM | POA: Diagnosis present

## 2022-09-29 LAB — ECHOCARDIOGRAM COMPLETE
AR max vel: 3.24 cm2
AV Area VTI: 3.48 cm2
AV Area mean vel: 3.52 cm2
AV Mean grad: 4 mmHg
AV Peak grad: 8.2 mmHg
Ao pk vel: 1.43 m/s
Area-P 1/2: 4.19 cm2
MV VTI: 3.13 cm2
S' Lateral: 2.2 cm

## 2022-10-20 ENCOUNTER — Encounter: Payer: Medicare Other | Admitting: Physical Medicine & Rehabilitation

## 2022-10-25 ENCOUNTER — Ambulatory Visit: Payer: Medicare Other | Attending: Nurse Practitioner | Admitting: Nurse Practitioner

## 2022-10-25 ENCOUNTER — Encounter: Payer: Self-pay | Admitting: Nurse Practitioner

## 2022-10-25 VITALS — BP 114/72 | HR 73 | Ht 73.0 in | Wt 350.0 lb

## 2022-10-25 DIAGNOSIS — I2510 Atherosclerotic heart disease of native coronary artery without angina pectoris: Secondary | ICD-10-CM

## 2022-10-25 DIAGNOSIS — E785 Hyperlipidemia, unspecified: Secondary | ICD-10-CM | POA: Diagnosis present

## 2022-10-25 DIAGNOSIS — I493 Ventricular premature depolarization: Secondary | ICD-10-CM

## 2022-10-25 DIAGNOSIS — I10 Essential (primary) hypertension: Secondary | ICD-10-CM | POA: Diagnosis present
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
## 2022-10-25 NOTE — Patient Instructions (Signed)
Medication Instructions:  Your physician recommends that you continue on your current medications as directed. Please refer to the Current Medication list given to you today.   Labwork: None  Testing/Procedures: None  Follow-Up: Follow up in 6 months.   Any Other Special Instructions Will Be Listed Below (If Applicable).     If you need a refill on your cardiac medications before your next appointment, please call your pharmacy.

## 2022-11-17 ENCOUNTER — Encounter: Payer: Self-pay | Admitting: Physical Medicine & Rehabilitation

## 2022-11-17 ENCOUNTER — Encounter: Payer: Medicare Other | Attending: Physical Medicine & Rehabilitation | Admitting: Physical Medicine & Rehabilitation

## 2022-11-17 VITALS — BP 128/83 | HR 78 | Ht 73.0 in

## 2022-11-17 DIAGNOSIS — D329 Benign neoplasm of meninges, unspecified: Secondary | ICD-10-CM | POA: Diagnosis present

## 2022-11-17 DIAGNOSIS — G8250 Quadriplegia, unspecified: Secondary | ICD-10-CM | POA: Insufficient documentation

## 2022-11-17 NOTE — Patient Instructions (Addendum)
FOR BOWELS, TRY METAMUCIL OR MIRALAX TWICE DAILY.   TAKE 3 SENOKOT-S AT BEDTIME AS OPPOSED TO MORNING.

## 2022-11-17 NOTE — Progress Notes (Signed)
Subjective:    Patient ID: Anthony Downs, male    DOB: November 08, 1950, 72 y.o.   MRN: [id number]  HPI  Anthony Downs is here in follow up of his chronic mobility issues. He was in the ED with fatigue in August. He has been doing better since then. HHPT has been coming to the house. He has been standing with the parallel bars and has experienced improvement in his strength. He and his wife have been working on diet and weight loss. He lost a few lbs so far. Swelling is still an issue, and they still use compression stockings.  He's not having any pain. Sleep is reasonable.   He is still constipated. He's taking 5 Seokot-s per day, metamucil at night. Coffee in am. He doesn't eat many vegetables and his wife wants him to drink more water.   Pain Inventory Average Pain 0 Pain Right Now 0 My pain is  none  LOCATION OF PAIN  none  BOWEL Number of stools per week: 2-3 Oral laxative use Yes  Type of laxative colase Enema or suppository use Yes   BLADDER Normal  Difficulty starting stream Yes     Mobility use a wheelchair needs help with transfers  Function retired I need assistance with the following:  dressing, bathing, toileting, and meal prep  Neuro/Psych weakness tremor trouble walking  Prior Studies Any changes since last visit?  no  Physicians involved in your care Any changes since last visit?  no   Family History  Problem Relation Age of Onset   Breast cancer Mother    Pancreatic cancer Mother    Aortic aneurysm Father    Social History   Socioeconomic History   Marital status: Married    Spouse name: Not on file   Number of children: 2   Years of education: 12   Highest education level: High school graduate  Occupational History   Occupation: Retired  Tobacco Use   Smoking status: Never   Smokeless tobacco: Never  Vaping Use   Vaping status: Never Used  Substance and Sexual Activity   Alcohol use: No    Comment: no use since 2008   Drug use:  No   Sexual activity: Yes  Other Topics Concern   Not on file  Social History Narrative   Lives with wife.   Right-handed.   Two cans Dr. Reino Kent daily.   Social Determinants of Health   Financial Resource Strain: High Risk (07/01/2020)   Received from [facility]   Overall Financial Resource Strain (CARDIA)    Difficulty of Paying Living Expenses: Very hard  Food Insecurity: Not on file  Transportation Needs: No Transportation Needs (07/01/2020)   Received from [facility], [facility]   [facility] - Transportation    Lack of Transportation (Medical): No    Lack of Transportation (Non-Medical): No  Physical Activity: Sufficiently Active (07/14/2017)   Received from [facility], [facility]   Exercise Vital Sign    Days of Exercise per Week: 3 days    Minutes of Exercise per Session: 70 min  Stress: Not on file  Social Connections: Not on file   Past Surgical History:  Procedure Laterality Date   APPLICATION OF CRANIAL NAVIGATION Left 04/29/2020   Procedure: APPLICATION OF CRANIAL NAVIGATION;  Surgeon: Jadene Pierini, MD;  Location: [facility] OR;  Service: Neurosurgery;  Laterality: Left;   arthroscopic knee     COLONOSCOPY N/A 11/29/2018  Procedure: COLONOSCOPY;  Surgeon: Malissa Hippo, MD;  Location: [facility];  Service: Endoscopy;  Laterality: N/A;  730-rescheduled 9/2 same time per Ann   CRANIOTOMY Left 04/29/2020   Procedure: LEFT CRANIECTOMY WITH TUMOR EXCISION;  Surgeon: Jadene Pierini, MD;  Location: [facility] OR;  Service: Neurosurgery;  Laterality: Left;   KNEE SURGERY Right    NASAL SEPTOPLASTY W/ TURBINOPLASTY Bilateral 03/19/2020   Procedure: NASAL SEPTOPLASTY WITH BILATERAL  TURBINATE REDUCTION;  Surgeon: Newman Pies, MD;  Location: [facility] OR;  Service: ENT;  Laterality: Bilateral;   VASECTOMY     Past Medical History:  Diagnosis Date   Acid reflux    Anxiety    Arthritis    Asthma    as a child   Basal cell carcinoma  01/26/2021   RIGHT POST SURICULAR INFERIOR   Basal cell carcinoma 01/26/2021   RIGHT POST AURICULAR SUPERIOR   Benign brain tumor (HCC)    Chest pain    a. 09/2019 MV: No ischemia.   Complication of anesthesia    slow to wake up and pain medicines make him sick   GERD (gastroesophageal reflux disease)    History of DVT (deep vein thrombosis)    History of echocardiogram    a. 09/2019 Echo: EF 60-65%, nl RV fxn; b. 09/2022 Echo: EF of 60-65% without regional wall motion abnormalities, normal RV size/function, and borderline dilatation of the aortic root at 39 mm.   Hypercholesteremia    Hypertension    PONV (postoperative nausea and vomiting)    PVC's (premature ventricular contractions)    a. 08/2022 Zio: predominantly sinus rhythm with an average heart rate of 71 (50-150), 5 brief SVT runs (longest 10.2 seconds with max rate of 150).  3.2% PVC burden was also noted.  No triggered events.   Skin cancer    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, squamous behind ear   Sleep apnea    no Cpap   Subdural hematoma, post-traumatic (HCC) 2008   fell off truck hit head on concrete   BP 128/83   Pulse 78   Ht 6\' 1"  (1.854 m)   SpO2 92%   BMI 46.18 kg/m   Opioid Risk Score:   Fall Risk Score:  `1  Depression screen PHQ 2/9     11/17/2022    8:40 AM 04/21/2022    2:13 PM 12/16/2021    2:12 PM 09/09/2021    2:43 PM 03/04/2021    2:35 PM 11/26/2020    2:00 PM 09/17/2020    2:39 PM  Depression screen PHQ 2/9  Decreased Interest 0 0 0 0 0 0 3  Down, Depressed, Hopeless 0 0 0 0 0 0 2  PHQ - 2 Score 0 0 0 0 0 0 5  Altered sleeping       3  Tired, decreased energy       3  Change in appetite       3  Feeling bad or failure about yourself        0  Trouble concentrating       3  Moving slowly or fidgety/restless       3  Suicidal thoughts       0  PHQ-9 Score       20      Review of Systems  Musculoskeletal:  Positive for gait problem.  All other systems reviewed and are negative.      Objective:   Physical Exam General: No  acute distress HEENT: NCAT, EOMI, oral membranes moist Cards: reg rate  Chest: normal effort Abdomen: Soft, NT, ND Skin: dry, intact Extremities: 1+ to 2+ LE edema Psych: pleasant and appropriate  Skin: intact Neuro:very alert and oriented. Very attentive and engaging. Very verbal and insightful. Intentional tremor R>L UE's. Low frequency.  Upper extremity strength grossly  4 out of 5 on left, RUE 3/5 with tremor. LLE 3/5. RLE 3/5.    Reflexes are still 3+. Intentional tremor in UE R>L remains Musculoskeletal:  good sitting posture         Assessment & Plan:  1. 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 -hamstrings are still tight--discussed stretches today -continue HHPT for now--might be able to progress to outpt if he continues to make gains.         2.  Left posterior tibial vein DVT:   now on coumadin             -recent dopplers clear 3.  Sleep disorder/OSA             -needs to work on getting acclimated to unit 4.  Neurogenic bowel:  -increase metamucil to twice daily (may use miralax also).. Continue senokot-s 5 x daily             - suppository or dig stim on toilet if needed 5.  Urinary frequency with incontinence: frequent UTI's            -per urology            -flomax             -he's continent 6.Seizures: depakote 500 mg every day--no sz's             - will come off of in February 2025? 7.Tremor: propranolol only daily 8. Swelling:              -lasix per primary             -TEDS, elevation while in chair   20 minutes of face to face patient care time were spent during this visit. All questions were encouraged and answered. Follow up with me in 3 months

## 2023-01-24 ENCOUNTER — Ambulatory Visit: Payer: Medicare Other | Admitting: Urology

## 2023-02-03 ENCOUNTER — Telehealth: Payer: Self-pay

## 2023-02-03 NOTE — Telephone Encounter (Signed)
His Lasix isn't prescribed by [place] and he's not taking Tamsulosin did he possibly say something else

## 2023-02-03 NOTE — Telephone Encounter (Signed)
Patient left a voice message 02-02-23.  Needing a refill on medications:  furosemide (LASIX) 20 MG tablet  Tamsulosin   Pharmacy:  [id number] - [place], [place] - [address] Phone: [phone]  Fax: [phone]

## 2023-02-07 ENCOUNTER — Encounter: Payer: Self-pay | Admitting: Urology

## 2023-02-07 ENCOUNTER — Ambulatory Visit (INDEPENDENT_AMBULATORY_CARE_PROVIDER_SITE_OTHER): Payer: Medicare Other | Admitting: Urology

## 2023-02-07 VITALS — BP 143/94 | HR 70

## 2023-02-07 DIAGNOSIS — R350 Frequency of micturition: Secondary | ICD-10-CM

## 2023-02-07 LAB — URINALYSIS, ROUTINE W REFLEX MICROSCOPIC
Bilirubin, UA: NEGATIVE
Glucose, UA: NEGATIVE
Ketones, UA: NEGATIVE
Leukocytes,UA: NEGATIVE
Nitrite, UA: NEGATIVE
Protein,UA: NEGATIVE
RBC, UA: NEGATIVE
Specific Gravity, UA: 1.015 (ref 1.005–1.030)
Urobilinogen, Ur: 1 mg/dL (ref 0.2–1.0)
pH, UA: 7.5 (ref 5.0–7.5)

## 2023-02-07 MED ORDER — FINASTERIDE 5 MG PO TABS: 5.0000 mg | ORAL_TABLET | Freq: Every day | ORAL | 3 refills | Status: DC

## 2023-02-07 MED ORDER — TAMSULOSIN HCL 0.4 MG PO CAPS: 0.4000 mg | ORAL_CAPSULE | Freq: Every day | ORAL | 3 refills | Status: DC

## 2023-02-07 NOTE — Progress Notes (Signed)
02/07/2023 11:45 AM   Anthony Downs 01/09/1951 [id number]  Referring provider: Elfredia Nevins, MD [address] [place],  [place] [address]  No chief complaint on file.   HPI:  F/u -    1) BPH, lower urinary tract symptoms-patient with frequency, urgency, intermittent stream.  He has had several cultures positive for E. coli. He voids in a diaper. Gets urine odor at times. He takes tamsulosin 0.8 mg po QHS. Finasteride added Oct 2022. 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 Right sided deficits and now in a motorized chair. Doing PT. H/o seizures.  He has some constipation and takes stool softeners and laxatives. He drinks Dr. Demetrius Charity every day. No prior GU hx or h/o BPH.    Bladder scan was 242 mL. He can't stand for DRE. UA clear. PVR 17 ml. AUASS = 14. Better voiding on tams and 5ari.  PSA was 1.2 (2.4) in Dec 2022.    He was seen Jul 2023 and had quite using diapers. Voids in a urinal. Getting extensive PT. Now ambulating with assistance. He had emesis and abd pain Nov 2023. CT benign from GU pt of view. No LAD or bone lesions. Using a urinal. PVR 102.  Today, seen for the above. PSA was 0.5 in 2024. He ran out of tamsulosin and has hesitancy. On finasteride. No dysuria or gross hematuria.    His wife had a lumpectomy and XRT in 2023 for breast ca.   PMH: Past Medical History:  Diagnosis Date   Acid reflux    Anxiety    Arthritis    Asthma    as a child   Basal cell carcinoma 01/26/2021   RIGHT POST SURICULAR INFERIOR   Basal cell carcinoma 01/26/2021   RIGHT POST AURICULAR SUPERIOR   Benign brain tumor (HCC)    Chest pain    a. 09/2019 MV: No ischemia.   Complication of anesthesia    slow to wake up and pain medicines make him sick   GERD (gastroesophageal reflux disease)    History of DVT (deep vein thrombosis)    History of echocardiogram    a. 09/2019 Echo: EF 60-65%, nl RV fxn; b. 09/2022 Echo: EF of 60-65% without regional  wall motion abnormalities, normal RV size/function, and borderline dilatation of the aortic root at 39 mm.   Hypercholesteremia    Hypertension    PONV (postoperative nausea and vomiting)    PVC's (premature ventricular contractions)    a. 08/2022 Zio: predominantly sinus rhythm with an average heart rate of 71 (50-150), 5 brief SVT runs (longest 10.2 seconds with max rate of 150).  3.2% PVC burden was also noted.  No triggered events.   Skin cancer    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, squamous behind ear   Sleep apnea    no Cpap   Subdural hematoma, post-traumatic (HCC) 2008   fell off truck hit head on concrete    Surgical History: Past Surgical History:  Procedure Laterality Date   APPLICATION OF CRANIAL NAVIGATION Left 04/29/2020   Procedure: APPLICATION OF CRANIAL NAVIGATION;  Surgeon: Jadene Pierini, MD;  Location: [facility] OR;  Service: Neurosurgery;  Laterality: Left;   arthroscopic knee     COLONOSCOPY N/A 11/29/2018   Procedure: COLONOSCOPY;  Surgeon: Malissa Hippo, MD;  [facility];  Service: Endoscopy;  Laterality: N/A;  730-rescheduled 9/2 same time per Ann   CRANIOTOMY Left 04/29/2020   Procedure: LEFT CRANIECTOMY WITH  TUMOR EXCISION;  Surgeon: Jadene Pierini, MD;  Location: [facility] OR;  Service: Neurosurgery;  Laterality: Left;   KNEE SURGERY Right    NASAL SEPTOPLASTY W/ TURBINOPLASTY Bilateral 03/19/2020   Procedure: NASAL SEPTOPLASTY WITH BILATERAL  TURBINATE REDUCTION;  Surgeon: Newman Pies, MD;  Location: [facility] OR;  Service: ENT;  Laterality: Bilateral;   VASECTOMY      Home Medications:  Allergies as of 02/07/2023       Reactions   Ativan [lorazepam]    Worsening confusion   Haldol [haloperidol]    Worsening confusion        Medication List        Accurate as of February 07, 2023 11:45 AM. If you have any questions, ask your nurse or doctor.          albuterol 108 (90 Base) MCG/ACT inhaler Commonly known as: VENTOLIN HFA Inhale 2 puffs  into the lungs every 4 (four) hours.   aspirin EC 81 MG tablet Take 81 mg by mouth daily. Swallow whole.   atorvastatin 10 MG tablet Commonly known as: LIPITOR Take 1 tablet (10 mg total) by mouth at bedtime.   cetirizine 10 MG tablet Commonly known as: ZYRTEC Take 10 mg by mouth daily.   cyanocobalamin 1000 MCG tablet Commonly known as: VITAMIN B12 Take 1,000 mcg by mouth daily.   divalproex 500 MG DR tablet Commonly known as: DEPAKOTE TAKE 1 TABLET BY MOUTH TWICE A DAY What changed: when to take this   finasteride 5 MG tablet Commonly known as: PROSCAR Take 1 tablet (5 mg total) by mouth daily.   furosemide 20 MG tablet Commonly known as: LASIX Take 20 mg by mouth 2 (two) times daily.   glimepiride 2 MG tablet Commonly known as: AMARYL Take 2 mg by mouth daily with breakfast.   magnesium oxide 400 (240 Mg) MG tablet Commonly known as: MAG-OX Take 1 tablet (400 mg total) by mouth daily.   pantoprazole 40 MG tablet Commonly known as: PROTONIX Take 1 tablet (40 mg total) by mouth daily.   potassium chloride SA 20 MEQ tablet Commonly known as: KLOR-CON M Take 20 mEq by mouth 2 (two) times daily.   propranolol 20 MG tablet Commonly known as: INDERAL Take 20 mg by mouth daily.   SOOTHE OP Apply 1 drop to eye daily as needed (dry eyes).   Vitamin D (Ergocalciferol) 1.25 MG (50000 UNIT) Caps capsule Commonly known as: DRISDOL Take 50,000 Units by mouth once a week.   zinc gluconate 50 MG tablet Take 50 mg by mouth daily.        Allergies:  Allergies  Allergen Reactions   Ativan [Lorazepam]     Worsening confusion   Haldol [Haloperidol]     Worsening confusion    Family History: Family History  Problem Relation Age of Onset   Breast cancer Mother    Pancreatic cancer Mother    Aortic aneurysm Father     Social History:  reports that he has never smoked. He has never used smokeless tobacco. He reports that he does not drink alcohol and does not  use drugs.   Physical Exam: BP (!) 143/94   Pulse 70   Constitutional:  Alert and oriented, No acute distress. In wheelchair.  HEENT: [place] AT, moist mucus membranes.  Trachea midline, no masses. Cardiovascular: No clubbing, cyanosis, or edema. Respiratory: Normal respiratory effort, no increased work of breathing. GI: Abdomen is soft, nontender, nondistended, no abdominal masses GU: No CVA tenderness Skin:  No rashes, bruises or suspicious lesions. Neurologic: Grossly intact, no focal deficits, moving all 4 extremities. Psychiatric: Normal mood and affect.  Laboratory Data: Lab Results  Component Value Date   WBC 8.6 08/19/2022   HGB 13.3 08/19/2022   HCT 41.6 08/19/2022   MCV 95.0 08/19/2022   PLT 162 08/19/2022    Lab Results  Component Value Date   CREATININE 0.84 09/14/2022    No results found for: "PSA"  No results found for: "TESTOSTERONE"  Lab Results  Component Value Date   HGBA1C 6.0 (H) 07/07/2020    Urinalysis    Component Value Date/Time   COLORURINE YELLOW 06/27/2022 0952   APPEARANCEUR CLEAR 06/27/2022 0952   APPEARANCEUR Clear 08/03/2021 1522   LABSPEC 1.009 06/27/2022 0952   PHURINE 6.0 06/27/2022 0952   GLUCOSEU NEGATIVE 06/27/2022 0952   HGBUR NEGATIVE 06/27/2022 0952   BILIRUBINUR NEGATIVE 06/27/2022 0952   BILIRUBINUR Negative 08/03/2021 1522   KETONESUR NEGATIVE 06/27/2022 0952   PROTEINUR NEGATIVE 06/27/2022 0952   UROBILINOGEN 0.2 09/03/2020 1523   UROBILINOGEN 0.2 04/04/2011 2241   NITRITE NEGATIVE 06/27/2022 0952   LEUKOCYTESUR NEGATIVE 06/27/2022 0952    Lab Results  Component Value Date   LABMICR Comment 08/03/2021   BACTERIA RARE (A) 08/13/2020    Pertinent Imaging:  No results found for this or any previous visit.  Results for orders placed during the hospital encounter of 05/12/22  US Venous Img Lower Bilateral (DVT)  Narrative CLINICAL DATA:  History of deep venous thrombosis  EXAM: BILATERAL LOWER EXTREMITY  VENOUS DOPPLER ULTRASOUND  TECHNIQUE: Gray-scale sonography with graded compression, as well as color Doppler and duplex ultrasound were performed to evaluate the lower extremity deep venous systems from the level of the common femoral vein and including the common femoral, femoral, profunda femoral, popliteal and calf veins including the posterior tibial, peroneal and gastrocnemius veins when visible. The superficial great saphenous vein was also interrogated. Spectral Doppler was utilized to evaluate flow at rest and with distal augmentation maneuvers in the common femoral, femoral and popliteal veins.  COMPARISON:  By report from 05/04/2020  FINDINGS: RIGHT LOWER EXTREMITY  Common Femoral Vein: No evidence of thrombus. Normal compressibility, respiratory phasicity and response to augmentation.  Saphenofemoral Junction: No evidence of thrombus. Normal compressibility and flow on color Doppler imaging.  Profunda Femoral Vein: No evidence of thrombus. Normal compressibility and flow on color Doppler imaging.  Femoral Vein: No evidence of thrombus. Normal compressibility, respiratory phasicity and response to augmentation.  Popliteal Vein: No evidence of thrombus. Normal compressibility, respiratory phasicity and response to augmentation.  Calf Veins: No evidence of thrombus. Normal compressibility and flow on color Doppler imaging.  Superficial Great Saphenous Vein: No evidence of thrombus. Normal compressibility.  Venous Reflux:  None.  Other Findings:  None.  LEFT LOWER EXTREMITY  Common Femoral Vein: No evidence of thrombus. Normal compressibility, respiratory phasicity and response to augmentation.  Saphenofemoral Junction: No evidence of thrombus. Normal compressibility and flow on color Doppler imaging.  Profunda Femoral Vein: No evidence of thrombus. Normal compressibility and flow on color Doppler imaging.  Femoral Vein: No evidence of thrombus. Normal  compressibility, respiratory phasicity and response to augmentation.  Popliteal Vein: No evidence of thrombus. Normal compressibility, respiratory phasicity and response to augmentation.  Calf Veins: No evidence of thrombus. Normal compressibility and flow on color Doppler imaging.  Superficial Great Saphenous Vein: No evidence of thrombus. Normal compressibility.  Venous Reflux:  None.  Other Findings:  None.  IMPRESSION: No evidence  of deep venous thrombosis in either lower extremity.   Electronically Signed By: Alcide Clever M.D. On: 05/12/2022 11:22  No results found for this or any previous visit.  No results found for this or any previous visit.  No results found for this or any previous visit.  No results found for this or any previous visit.  No results found for this or any previous visit.  No results found for this or any previous visit.   Assessment & Plan:    1. Urine frequency (Primary) Tams and finasteride refilled.  - Urinalysis, Routine w reflex microscopic   No follow-ups on file.  Jerilee Field, MD  [facility]  [address] [place], [place] [address] [phone]

## 2023-02-16 ENCOUNTER — Encounter: Payer: Self-pay | Admitting: Physical Medicine & Rehabilitation

## 2023-02-16 ENCOUNTER — Encounter: Payer: Medicare Other | Attending: Physical Medicine & Rehabilitation | Admitting: Physical Medicine & Rehabilitation

## 2023-02-16 VITALS — BP 145/82 | HR 75 | Ht 73.0 in | Wt 350.0 lb

## 2023-02-16 DIAGNOSIS — D496 Neoplasm of unspecified behavior of brain: Secondary | ICD-10-CM | POA: Insufficient documentation

## 2023-02-16 DIAGNOSIS — G8250 Quadriplegia, unspecified: Secondary | ICD-10-CM | POA: Insufficient documentation

## 2023-02-16 NOTE — Progress Notes (Signed)
Subjective:    Patient ID: Anthony Downs, male    DOB: Nov 23, 1950, 73 y.o.   MRN: [id number]  HPI  Anthony Downs is here in follow up of his tetraplegia. I last saw him last in October. He is still receiving HH therapies. They were renewed by his PCP. He likes the new group and has been making some gains with his mobility. They are starting OT as well. He is able to move his right leg more effectively. He can stand for about 2" x 3 with Stedy. He still doesn't have much motivation to perform exercises on his own. His mood is better overall.   He has developed a few pressure sores on his sacrum and buttocks. HHRN is helping with treatment. Wife is using foam dressing with silvadene. They are doing toilet transfers and bathroom was converted to handicapped accessible. His wife had sweat pants adjusted for velcro closure to allow easier voiding.   He still deals with edema in each leg. He wears compressive garments.      Pain Inventory Average Pain 0 Pain Right Now 0 My pain is  no pain  Sleep (in general) Good   Family History  Problem Relation Age of Onset   Breast cancer Mother    Pancreatic cancer Mother    Aortic aneurysm Father    Social History   Socioeconomic History   Marital status: Married    Spouse name: Not on file   Number of children: 2   Years of education: 12   Highest education level: High school graduate  Occupational History   Occupation: Retired  Tobacco Use   Smoking status: Never   Smokeless tobacco: Never  Vaping Use   Vaping status: Never Used  Substance and Sexual Activity   Alcohol use: No    Comment: no use since 2008   Drug use: No   Sexual activity: Yes  Other Topics Concern   Not on file  Social History Narrative   Lives with wife.   Right-handed.   Two cans Dr. Reino Kent daily.   Social Drivers of Corporate investment banker Strain: High Risk (07/01/2020)   Received from [facility], [facility]   Overall Financial Resource  Strain (CARDIA)    Difficulty of Paying Living Expenses: Very hard  Food Insecurity: Not on file  Transportation Needs: No Transportation Needs (07/01/2020)   Received from [facility], [facility]   PRAPARE - Transportation    Lack of Transportation (Medical): No    Lack of Transportation (Non-Medical): No  Physical Activity: Sufficiently Active (07/14/2017)   Received from [facility], [facility]   Exercise Vital Sign    Days of Exercise per Week: 3 days    Minutes of Exercise per Session: 70 min  Stress: Not on file  Social Connections: Not on file   Past Surgical History:  Procedure Laterality Date   APPLICATION OF CRANIAL NAVIGATION Left 04/29/2020   Procedure: APPLICATION OF CRANIAL NAVIGATION;  Surgeon: Jadene Pierini, MD;  Location: [facility] OR;  Service: Neurosurgery;  Laterality: Left;   arthroscopic knee     COLONOSCOPY N/A 11/29/2018   Procedure: COLONOSCOPY;  Surgeon: Malissa Hippo, MD;  Location: [facility];  Service: Endoscopy;  Laterality: N/A;  730-rescheduled 9/2 same time per Ann   CRANIOTOMY Left 04/29/2020   Procedure: LEFT CRANIECTOMY WITH TUMOR EXCISION;  Surgeon: Jadene Pierini, MD;  Location: [facility] OR;  Service: Neurosurgery;  Laterality: Left;  KNEE SURGERY Right    NASAL SEPTOPLASTY W/ TURBINOPLASTY Bilateral 03/19/2020   Procedure: NASAL SEPTOPLASTY WITH BILATERAL  TURBINATE REDUCTION;  Surgeon: Newman Pies, MD;  Location: [facility] OR;  Service: ENT;  Laterality: Bilateral;   VASECTOMY     Past Surgical History:  Procedure Laterality Date   APPLICATION OF CRANIAL NAVIGATION Left 04/29/2020   Procedure: APPLICATION OF CRANIAL NAVIGATION;  Surgeon: Jadene Pierini, MD;  Location: [facility] OR;  Service: Neurosurgery;  Laterality: Left;   arthroscopic knee     COLONOSCOPY N/A 11/29/2018   Procedure: COLONOSCOPY;  Surgeon: Malissa Hippo, MD;  [facility]: [facility];  Service: Endoscopy;  Laterality: N/A;  730-rescheduled 9/2 same time per Ann    CRANIOTOMY Left 04/29/2020   Procedure: LEFT CRANIECTOMY WITH TUMOR EXCISION;  Surgeon: Jadene Pierini, MD;  Location: [facility] OR;  Service: Neurosurgery;  Laterality: Left;   KNEE SURGERY Right    NASAL SEPTOPLASTY W/ TURBINOPLASTY Bilateral 03/19/2020   Procedure: NASAL SEPTOPLASTY WITH BILATERAL  TURBINATE REDUCTION;  Surgeon: Newman Pies, MD;  Location: [facility] OR;  Service: ENT;  Laterality: Bilateral;   VASECTOMY     Past Medical History:  Diagnosis Date   Acid reflux    Anxiety    Arthritis    Asthma    as a child   Basal cell carcinoma 01/26/2021   RIGHT POST SURICULAR INFERIOR   Basal cell carcinoma 01/26/2021   RIGHT POST AURICULAR SUPERIOR   Benign brain tumor (HCC)    Chest pain    a. 09/2019 MV: No ischemia.   Complication of anesthesia    slow to wake up and pain medicines make him sick   GERD (gastroesophageal reflux disease)    History of DVT (deep vein thrombosis)    History of echocardiogram    a. 09/2019 Echo: EF 60-65%, nl RV fxn; b. 09/2022 Echo: EF of 60-65% without regional wall motion abnormalities, normal RV size/function, and borderline dilatation of the aortic root at 39 mm.   Hypercholesteremia    Hypertension    PONV (postoperative nausea and vomiting)    PVC's (premature ventricular contractions)    a. 08/2022 Zio: predominantly sinus rhythm with an average heart rate of 71 (50-150), 5 brief SVT runs (longest 10.2 seconds with max rate of 150).  3.2% PVC burden was also noted.  No triggered events.   Skin cancer    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, squamous behind ear   Sleep apnea    no Cpap   Subdural hematoma, post-traumatic (HCC) 2008   fell off truck hit head on concrete   BP (!) 145/82   Pulse 75   Ht 6\' 1"  (1.854 m)   Wt (!) 350 lb (158.8 kg)   SpO2 94%   BMI 46.18 kg/m   Opioid Risk Score:   Fall Risk Score:  `1  Depression screen PHQ 2/9     11/17/2022    8:40 AM 04/21/2022    2:13 PM 12/16/2021    2:12 PM 09/09/2021    2:43 PM  03/04/2021    2:35 PM 11/26/2020    2:00 PM 09/17/2020    2:39 PM  Depression screen PHQ 2/9  Decreased Interest 0 0 0 0 0 0 3  Down, Depressed, Hopeless 0 0 0 0 0 0 2  PHQ - 2 Score 0 0 0 0 0 0 5  Altered sleeping       3  Tired, decreased energy  3  Change in appetite       3  Feeling bad or failure about yourself        0  Trouble concentrating       3  Moving slowly or fidgety/restless       3  Suicidal thoughts       0  PHQ-9 Score       20     Review of Systems     Objective:   Physical Exam General: No acute distress HEENT: NCAT, EOMI, oral membranes moist Cards: reg rate  Chest: normal effort Abdomen: Soft, NT, ND Skin: dry, intact Extremities: 2+ edema bilateral LE, compression garments in place Psych: pleasant and appropriate  Skin: intact Neuro:very alert and oriented. Very attentive and engaging. Very verbal and insightful. Intentional tremor R>L UE's. Low frequency.  Upper extremity strength grossly  4 out of 5 on left, RUE 3/5 with tremor. Some limits in ROM. LLE 2-3/5.with limitations in ADF/PF.  RLE 3/5.    Reflexes are still 3+. Intentional tremor in UE R>L remains Musculoskeletal:  good sitting posture. Right UE tight with flexion/rotation.          Assessment & Plan:  1. 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 -hamstrings are still tight--discussed stretches today -continue HHPT for now for pre-gait activities. --discussed the need to work on HEP on his own between therapy visits. He has the ability to do some simple exercises now.         2.  Left posterior tibial vein DVT:   now on coumadin             -recent dopplers clear 3.  Sleep disorder/OSA             -needs to work on getting acclimated to unit 4.  Neurogenic bowel:             - metamucil twice daily (may use  miralax also)..  -Continue senokot-s 5 x daily             - suppository or dig stim on toilet if needed 5.  Urinary frequency with incontinence: frequent UTI's            -per urology            -flomax             -he remains continent 6.Seizures: depakote 500 mg every day--no sz's             - will come off of in February 2025? 7.Tremor: propranolol only daily 8. Swelling:              -lasix per primary             -TEDS, elevation while in chair   20 minutes of face to face patient care time were spent during this visit. All questions were encouraged and answered. Follow up with me in 4 months

## 2023-02-16 NOTE — Patient Instructions (Signed)
ALWAYS FEEL FREE TO CALL OUR OFFICE WITH ANY PROBLEMS OR QUESTIONS [phone])  **PLEASE NOTE** ALL MEDICATION REFILL REQUESTS (INCLUDING CONTROLLED SUBSTANCES) NEED TO BE MADE AT LEAST 7 DAYS PRIOR TO REFILL BEING DUE. ANY REFILL REQUESTS INSIDE THAT TIME FRAME MAY RESULT IN DELAYS IN RECEIVING YOUR PRESCRIPTION.

## 2023-03-07 ENCOUNTER — Telehealth: Payer: Self-pay | Admitting: Neurology

## 2023-03-07 NOTE — Telephone Encounter (Signed)
 ..  Pt understands that although there may be some limitations with this type of visit, we will take all precautions to reduce any security or privacy concerns.  Pt understands that this will be treated like an in office visit and we will file with pt's insurance, and there may be a patient responsible charge related to this service. ? ?

## 2023-03-07 NOTE — Telephone Encounter (Signed)
 Noted

## 2023-03-09 ENCOUNTER — Telehealth: Payer: Self-pay

## 2023-03-09 ENCOUNTER — Telehealth (INDEPENDENT_AMBULATORY_CARE_PROVIDER_SITE_OTHER): Payer: Medicare Other | Admitting: Neurology

## 2023-03-09 ENCOUNTER — Encounter: Payer: Self-pay | Admitting: Neurology

## 2023-03-09 DIAGNOSIS — G250 Essential tremor: Secondary | ICD-10-CM | POA: Diagnosis not present

## 2023-03-09 DIAGNOSIS — R569 Unspecified convulsions: Secondary | ICD-10-CM

## 2023-03-09 DIAGNOSIS — Z86018 Personal history of other benign neoplasm: Secondary | ICD-10-CM | POA: Diagnosis not present

## 2023-03-09 DIAGNOSIS — Z9889 Other specified postprocedural states: Secondary | ICD-10-CM

## 2023-03-09 NOTE — Patient Instructions (Signed)
 Continue current medications Continue with physical therapy Continue to follow with PCP Return as needed.

## 2023-03-09 NOTE — Telephone Encounter (Signed)
 Called pt's wife and went over pt's medications with her. Added 2 new medications to pt's chart and Updated his chart.

## 2023-03-09 NOTE — Progress Notes (Signed)
 [facility]  PATIENT: Anthony Downs DOB: 1950-08-01  REFERRING CLINICIAN: Elfredia Nevins, MD HISTORY FROM: Spouse [name]  REASON FOR VISIT: Seizure s/p meningioma resection    HISTORICAL  CHIEF COMPLAINT:  Chief Complaint  Patient presents with   Seizures    Follow up   INTERVAL HISTORY 03/09/2023:  Patient was called today, wife at his side.  Last visit was a year ago.  Since then, he has been doing well, denies any seizure or seizure like activity.  He discontinued Depakote about 2 months ago, has not had any seizure.  Still having tremors on propranolol 20 mg daily.  Still doing the physical therapy, reports some improvement in his transfer.  No other complaints, no concerns at the moment.   INTERVAL HISTORY 03/04/2022:  Patient presents today for follow-up, last visit was a year ago.  Since then he has not had any seizure or seizure-like activity.  He remains on the Depakote 500 mg daily.  Reports tremor are better with propranolol 20 mg daily.  He is still undergoing physical therapy.  Wife reports improvement in this strength.   INTERVAL HISTORY 02/26/2021:  Patient presents for follow-up with wife [name].  At last visit in October he did not have any seizures and plan was to decrease the Seroquel because he was getting still sleepy during the daytime.  Since then no additional seizures, wife discontinued Seroquel, discontinued the Atarax and Ritalin, no additional sundowning effects.  Right now he is only taking 1 trazodone at nighttime.  He is more alert more interactive and is setting to restart physical therapy soon, he is making progress with his RLE.  No seizures since last visit.  Currently he does not have any complaints.     INTERVAL HISTORY 11/06/2020 Mr. Corp presents today for follow-up, he presents today with his sister as his wife [name] has a doctor's appointment.  At last visit on August 15 plan was to discontinue the Keppra and switch  him to Depakote.  Per wife he was very somnolent initially, his Depakote was checked and was 106 at that time advised her to decrease Depakote to 500 mg twice daily.  He was also started on Seroquel for delirium and titrated to 50 mg nightly.  Per wife who contacted me via MyChart, she expressed that Martrell is still somnolent during the daytime, sometimes has difficulty performing physical therapy and she is worried that he will discharged from physical therapy.  He has not had any seizures since last visit.  His sleep is better but still required the Seroquel at night.   HISTORY OF PRESENT ILLNESS:  This is a 73 year old male with past medical history of essential hypertension, essential tremor, meningioma status post removal in April 2022 who is presenting with new onset seizure. In brief, wife reports that patient was normal prior to his meningioma resection, no memory deficits, no behavioral problems and no seizure.  He did have minimal right upper extremity and right lower extremity weakness as he was holding his right arm in close to his body and also dragging his right foot. Because of this, his PMD ordered a MRI brain which showed a meningioma left greater than right with surrounding vasogenic edema in the left posterior frontal lobe which was resected in April 2022.  Per chart review his neurosurgery note reports "postop MRI showed a gross total resection except for some possible residual tumor in the patent portion of the sinus.  The tumor was very difficult to  dissected free and appeared to be invasive.  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  His aphasia improved, strength slowly improved.  He was seen by PT/OT\SLP who recommended rehab placement".  He was in rehab for a total of Meningioma removed in April, rehab for 51 days then home. Wife reported since leaving rehab he is having delirium,  sundowning syndrome.  He is on Seroquel 50 mg nightly and trazodone 100 mg nightly but he is not sleeping during the night. In the contrary, he sleeps during the day, per wife it is very difficult to keep him up during the day.   Wife reports on July 26 she heard the bridge the bed shake, when she went to check on him his whole body was shaking, head turned to the right, eyes rolled back.  She reported episode lasted more than 2 minutes because she was still shaking while she was on the phone with EMS.  After the event, per wife he look like he was passed out.  He was initially taken to [facility] but later transferred to [facility] where he had a EEG that showed left greater than right slowing but no seizures.  He was also found to have a UTI.  He was started on levetiracetam 500 mg twice daily and since leaving the hospital wife denies any additional seizure or seizure-like event.  Since the seizure was reported memory is worse, with less on the right side is worse, and he is more irritable.  Sleep is worse as is not sleeping during the night.   Handedness: Right handed    Seizure Type: Generalized convulsion   Current frequency: only one   Any injuries from seizures: None  Seizure risk factors: Meningioma s/p resection   Previous ASMs: Levetiracetam, Levetiracetam   Currenty ASMs: None   ASMs side effects: Memory, sleep, irritability with Levetiracetam, Sleep apnea   Brain Images 7/28:  No evidence of residual or recurrent meningioma tumor. Vertex craniectomy and cranioplasty for resection of meningioma. Segmental resection of the superior sagittal sinus. No residual or recurrent tumor seen in the region.  Previous EEGs: 7/27: This study is suggestive of cortical dysfunction arising from left frontocentral region suggestive of underlying structural abnormality. There is also mild diffuse encephalopathy, nonspecific etiology. No seizures or definite epileptiform discharges were seen  throughout the recording.   OTHER MEDICAL CONDITIONS: HTN, Essential tremors   REVIEW OF SYSTEMS: Unable to fully obtain ROS due to patient mental status   ALLERGIES: Allergies  Allergen Reactions   Ativan [Lorazepam]     Worsening confusion   Haldol [Haloperidol]     Worsening confusion    HOME MEDICATIONS: Outpatient Medications Prior to Visit  Medication Sig Dispense Refill   albuterol (VENTOLIN HFA) 108 (90 Base) MCG/ACT inhaler Inhale 2 puffs into the lungs every 4 (four) hours. (Patient not taking: Reported on 03/09/2023)     aspirin EC 81 MG tablet Take 81 mg by mouth daily. Swallow whole.     atorvastatin (LIPITOR) 10 MG tablet Take 1 tablet (10 mg total) by mouth at bedtime. 30 tablet 0   b complex vitamins capsule Take 1 capsule by mouth daily.     cetirizine (ZYRTEC) 10 MG tablet Take 10 mg by mouth daily.     cyanocobalamin (VITAMIN B12) 1000 MCG tablet Take 1,000 mcg by mouth daily.     empagliflozin (JARDIANCE) 10 MG TABS tablet Take  10 mg by mouth daily.     finasteride (PROSCAR) 5 MG tablet Take 1 tablet (5 mg total) by mouth daily. 90 tablet 3   furosemide (LASIX) 20 MG tablet Take 20 mg by mouth 2 (two) times daily.     glimepiride (AMARYL) 2 MG tablet Take 2 mg by mouth in the morning and at bedtime.     magnesium oxide (MAG-OX) 400 (240 Mg) MG tablet Take 1 tablet (400 mg total) by mouth daily. 90 tablet 3   pantoprazole (PROTONIX) 40 MG tablet Take 1 tablet (40 mg total) by mouth daily. 30 tablet 0   potassium chloride SA (KLOR-CON M) 20 MEQ tablet Take 20 mEq by mouth 2 (two) times daily.     propranolol (INDERAL) 20 MG tablet Take 20 mg by mouth daily.     Propylene Glycol-Glycerin (SOOTHE OP) Apply 1 drop to eye daily as needed (dry eyes).     silver sulfADIAZINE (SILVADENE) 1 % cream Apply 1 Application topically 2 (two) times daily. (Patient not taking: Reported on 03/09/2023)     tamsulosin (FLOMAX) 0.4 MG CAPS capsule Take 1 capsule (0.4 mg total) by mouth  daily after supper. 90 capsule 3   Vitamin D, Ergocalciferol, (DRISDOL) 1.25 MG (50000 UNIT) CAPS capsule Take 50,000 Units by mouth once a week. (Patient not taking: Reported on 03/09/2023)     zinc gluconate 50 MG tablet Take 50 mg by mouth daily.     No facility-administered medications prior to visit.    PAST MEDICAL HISTORY: Past Medical History:  Diagnosis Date   Acid reflux    Anxiety    Arthritis    Asthma    as a child   Basal cell carcinoma 01/26/2021   RIGHT POST SURICULAR INFERIOR   Basal cell carcinoma 01/26/2021   RIGHT POST AURICULAR SUPERIOR   Benign brain tumor (HCC)    Chest pain    a. 09/2019 MV: No ischemia.   Complication of anesthesia    slow to wake up and pain medicines make him sick   GERD (gastroesophageal reflux disease)    History of DVT (deep vein thrombosis)    History of echocardiogram    a. 09/2019 Echo: EF 60-65%, nl RV fxn; b. 09/2022 Echo: EF of 60-65% without regional wall motion abnormalities, normal RV size/function, and borderline dilatation of the aortic root at 39 mm.   Hypercholesteremia    Hypertension    PONV (postoperative nausea and vomiting)    PVC's (premature ventricular contractions)    a. 08/2022 Zio: predominantly sinus rhythm with an average heart rate of 71 (50-150), 5 brief SVT runs (longest 10.2 seconds with max rate of 150).  3.2% PVC burden was also noted.  No triggered events.   Skin cancer    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, squamous behind ear   Sleep apnea    no Cpap   Subdural hematoma, post-traumatic (HCC) 2008   fell off truck hit head on concrete    PAST SURGICAL HISTORY: Past Surgical History:  Procedure Laterality Date   APPLICATION OF CRANIAL NAVIGATION Left 04/29/2020   Procedure: APPLICATION OF CRANIAL NAVIGATION;  Surgeon: Jadene Pierini, MD;  Location: [facility] OR;  Service: Neurosurgery;  Laterality: Left;   arthroscopic knee     COLONOSCOPY N/A 11/29/2018   Procedure: COLONOSCOPY;  Surgeon: Malissa Hippo, MD;  Location: [facility];  Service: Endoscopy;  Laterality: N/A;  730-rescheduled 9/2 same time per Dewayne Hatch   CRANIOTOMY Left  04/29/2020   Procedure: LEFT CRANIECTOMY WITH TUMOR EXCISION;  Surgeon: Jadene Pierini, MD;  Location: [facility] OR;  Service: Neurosurgery;  Laterality: Left;   KNEE SURGERY Right    NASAL SEPTOPLASTY W/ TURBINOPLASTY Bilateral 03/19/2020   Procedure: NASAL SEPTOPLASTY WITH BILATERAL  TURBINATE REDUCTION;  Surgeon: Newman Pies, MD;  Location: [facility] OR;  Service: ENT;  Laterality: Bilateral;   VASECTOMY      FAMILY HISTORY: Family History  Problem Relation Age of Onset   Breast cancer Mother    Pancreatic cancer Mother    Aortic aneurysm Father     SOCIAL HISTORY: Social History   Socioeconomic History   Marital status: Married    Spouse name: Not on file   Number of children: 2   Years of education: 12   Highest education level: High school graduate  Occupational History   Occupation: Retired  Tobacco Use   Smoking status: Never   Smokeless tobacco: Never  Vaping Use   Vaping status: Never Used  Substance and Sexual Activity   Alcohol use: No    Comment: no use since 2008   Drug use: No   Sexual activity: Yes  Other Topics Concern   Not on file  Social History Narrative   Lives with wife.   Right-handed.   Two cans Dr. Reino Kent daily.   Social Drivers of Corporate investment banker Strain: High Risk (07/01/2020)   Received from [facility]   Overall Financial Resource Strain (CARDIA)    Difficulty of Paying Living Expenses: Very hard  Food Insecurity: Not on file  Transportation Needs: No Transportation Needs (07/01/2020)   Received from [facility], [facility]   PRAPARE - Transportation    Lack of Transportation (Medical): No    Lack of Transportation (Non-Medical): No  Physical Activity: Sufficiently Active (07/14/2017)   Received from [facility], [facility]   Exercise Vital Sign    Days of  Exercise per Week: 3 days    Minutes of Exercise per Session: 70 min  Stress: Not on file  Social Connections: Not on file  Intimate Partner Violence: Not on file     PHYSICAL EXAM  GENERAL EXAM/CONSTITUTIONAL: Vitals:  There were no vitals filed for this visit.   There is no height or weight on file to calculate BMI. Wt Readings from Last 3 Encounters:  02/16/23 (!) 350 lb (158.8 kg)  10/25/22 (!) 350 lb (158.8 kg)  08/19/22 288 lb 12.8 oz (131 kg)   Patient is in no distress; well developed, nourished and groomed; neck is supple, sitting in his electronic wheelchair     EYES: Visual fields full to confrontation, Extraocular movements intacts,   MUSCULOSKELETAL: Gait, strength, tone, movements noted in Neurologic exam below  NEUROLOGIC: MENTAL STATUS:  awake, alert, oriented to person, place normal attention and concentration language fluent, comprehension intact, naming intact fund of knowledge appropriate 2nd, 3rd, 4th, 6th - visual fields full to confrontation, extraocular muscles intact, no nystagmus 5th - facial sensation symmetric 7th - facial strength symmetric 8th - hearing intact 9th - palate elevates symmetrically, uvula midline 12th - tongue protrusion midline   DIAGNOSTIC DATA (LABS, IMAGING, TESTING) - I reviewed patient records, labs, notes, testing and imaging myself where available.  Lab Results  Component Value Date   WBC 8.6 08/19/2022   HGB 13.3 08/19/2022   HCT 41.6 08/19/2022   MCV 95.0 08/19/2022   PLT 162 08/19/2022  Component Value Date/Time   NA 138 09/14/2022 1239   NA 143 02/26/2021 1521   K 4.1 09/14/2022 1239   CL 104 09/14/2022 1239   CO2 28 09/14/2022 1239   GLUCOSE 175 (H) 09/14/2022 1239   BUN 11 09/14/2022 1239   BUN 13 02/26/2021 1521   CREATININE 0.84 09/14/2022 1239   CALCIUM 8.6 (L) 09/14/2022 1239   PROT 5.8 (L) 08/19/2022 1405   PROT 6.4 03/04/2022 1205   ALBUMIN 3.0 (L) 08/19/2022 1405   ALBUMIN 3.9  03/04/2022 1205   AST 32 08/19/2022 1405   ALT 38 08/19/2022 1405   ALKPHOS 82 08/19/2022 1405   BILITOT 0.5 08/19/2022 1405   BILITOT 0.3 03/04/2022 1205   GFRNONAA >60 09/14/2022 1239   GFRAA 70 (L) 04/04/2011 2128   No results found for: "CHOL", "HDL", "LDLCALC", "LDLDIRECT", "TRIG" Lab Results  Component Value Date   HGBA1C 6.0 (H) 07/07/2020   Lab Results  Component Value Date   VITAMINB12 556 08/14/2020   Lab Results  Component Value Date   TSH 4.643 (H) 08/19/2022    MRI Brain 08/14/2020 No evidence of residual or recurrent meningioma tumor. Vertex craniectomy and cranioplasty for resection of meningioma. Segmental resection of the superior sagittal sinus. No residual or recurrent tumor seen in the region.  CT Venogram 04/28/2020 1. 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 A small portion of the mass does extend to the right of the falx and overlies the medial aspect of the posterior right frontal lobe. Unchanged mass effect upon the left frontal lobe with mild-to-moderate underlying vasogenic edema. Notably, there is mass effect upon the left motor cortex. 2. The parafalcine meningioma fills and expands portions of the superior sagittal sinus. However, anterior and posterior to this, the superior sagittal sinus appears patent. No evidence of intracranial venous thrombosis elsewhere. 3. Subtle remodeling of the inner table of the parietal calvarium adjacent to the mass. 4. Chronic cortically-based foci of encephalomalacia within the anteroinferior left frontal lobe and anterior left temporal lobe, likely posttraumatic in etiology   Routine EEG 08/13/2020 This study is suggestive of cortical dysfunction arising from left frontocentral region suggestive of underlying structural abnormality. There is also mild diffuse encephalopathy, nonspecific etiology. No seizures or  definite epileptiform discharges were seen throughout the recording.   I personally reviewed brain Images and previous EEG reports.   ASSESSMENT AND PLAN  73 y.o. year old male with past medical history of parafalcine meningioma s/p resection, seizure disorder, essential tremor, hypertension, diabetes who is presenting for following. No seizures since last visit. He is off Depakote for the past 2 months and has been doing well.  Plan will be to observe patient off Depakote, and they understand to contact me if he does have another seizure.  Advised him to continue with physical therapy, continue to follow with PCP.  For his essential tremor, he is on propranolol 20 mg daily but still has action tremor, advised them to discuss with PCP regarding increasing the dose to 20 mg twice daily if no contraindication.   Continue to follow with PCP and return as needed.   1. Seizures (HCC)   2. S/P resection of meningioma   3. Essential tremor       Patient Instructions  Continue current medications Continue with physical therapy Continue to follow with PCP Return as needed.   Per [facility] statutes, patients with seizures  are not allowed to drive until they have been seizure-free for six months.  Other recommendations include using caution when using heavy equipment or power tools. Avoid working on ladders or at heights. Take showers instead of baths.  Do not swim alone.  Ensure the water temperature is not too high on the home water heater. Do not go swimming alone. Do not lock yourself in a room alone (i.e. bathroom). When caring for infants or small children, sit down when holding, feeding, or changing them to minimize risk of injury to the child in the event you have a seizure. Maintain good sleep hygiene. Avoid alcohol.  Also recommend adequate sleep, hydration, good diet and minimize stress.   During the Seizure  - First, ensure adequate ventilation and place patients on the floor  on their left side  Loosen clothing around the neck and ensure the airway is patent. If the patient is clenching the teeth, do not force the mouth open with any object as this can cause severe damage - Remove all items from the surrounding that can be hazardous. The patient may be oblivious to what's happening and may not even know what he or she is doing. If the patient is confused and wandering, either gently guide him/her away and block access to outside areas - Reassure the individual and be comforting - Call 911. In most cases, the seizure ends before EMS arrives. However, there are cases when seizures may last over 3 to 5 minutes. Or the individual may have developed breathing difficulties or severe injuries. If a pregnant patient or a person with diabetes develops a seizure, it is prudent to call an ambulance. - Finally, if the patient does not regain full consciousness, then call EMS. Most patients will remain confused for about 45 to 90 minutes after a seizure, so you must use judgment in calling for help. - Avoid restraints but make sure the patient is in a bed with padded side rails - Place the individual in a lateral position with the neck slightly flexed; this will help the saliva drain from the mouth and prevent the tongue from falling backward - Remove all nearby furniture and other hazards from the area - Provide verbal assurance as the individual is regaining consciousness - Provide the patient with privacy if possible - Call for help and start treatment as ordered by the caregiver   After the Seizure (Postictal Stage)  After a seizure, most patients experience confusion, fatigue, muscle pain and/or a headache. Thus, one should permit the individual to sleep. For the next few days, reassurance is essential. Being calm and helping reorient the person is also of importance.  Most seizures are painless and end spontaneously. Seizures are not harmful to others but can lead to  complications such as stress on the lungs, brain and the heart. Individuals with prior lung problems may develop labored breathing and respiratory distress.     No orders of the defined types were placed in this encounter.    No orders of the defined types were placed in this encounter.    Return if symptoms worsen or fail to improve.  Virtual Visit via Video Note  I connected with  Anthony Downs on 03/09/23 at 11:15 AM EST by a video enabled telemedicine application and verified that I am speaking with the correct person using two identifiers.  Location: Patient: home Provider: [facility] Office   I discussed the limitations of evaluation and management by telemedicine and the availability of in  person appointments. The patient expressed understanding and agreed to proceed.   I discussed the assessment and treatment plan with the patient. The patient was provided an opportunity to ask questions and all were answered. The patient agreed with the plan and demonstrated an understanding of the instructions.   The patient was advised to call back or seek an in-person evaluation if the symptoms worsen or if the condition fails to improve as anticipated.  I provided 15 minutes of non-face-to-face time during this encounter.   Windell Norfolk, MD 03/09/2023, 11:37 AM  [facility] [address] [place], [place] [address] [phone]

## 2023-03-10 ENCOUNTER — Telehealth: Payer: Medicare Other | Admitting: Neurology

## 2023-04-01 ENCOUNTER — Telehealth: Payer: Self-pay | Admitting: Physical Medicine & Rehabilitation

## 2023-04-01 DIAGNOSIS — D329 Benign neoplasm of meninges, unspecified: Secondary | ICD-10-CM

## 2023-04-01 DIAGNOSIS — G8250 Quadriplegia, unspecified: Secondary | ICD-10-CM

## 2023-04-01 NOTE — Telephone Encounter (Signed)
 Patient will finish in home therapy next week. Patient is asking for a referral for Physical Therapy. Please send to [facility] on [address]

## 2023-04-03 NOTE — Telephone Encounter (Signed)
 Rx for therapy sent

## 2023-04-04 ENCOUNTER — Encounter: Payer: Self-pay | Admitting: *Deleted

## 2023-04-12 ENCOUNTER — Ambulatory Visit: Attending: Physical Medicine & Rehabilitation | Admitting: Physical Therapy

## 2023-04-12 VITALS — BP 140/77 | HR 66

## 2023-04-12 DIAGNOSIS — M6281 Muscle weakness (generalized): Secondary | ICD-10-CM | POA: Insufficient documentation

## 2023-04-12 DIAGNOSIS — R2689 Other abnormalities of gait and mobility: Secondary | ICD-10-CM | POA: Insufficient documentation

## 2023-04-12 DIAGNOSIS — R251 Tremor, unspecified: Secondary | ICD-10-CM | POA: Diagnosis present

## 2023-04-12 DIAGNOSIS — R2681 Unsteadiness on feet: Secondary | ICD-10-CM | POA: Insufficient documentation

## 2023-04-12 DIAGNOSIS — R29898 Other symptoms and signs involving the musculoskeletal system: Secondary | ICD-10-CM | POA: Diagnosis present

## 2023-04-12 DIAGNOSIS — R29818 Other symptoms and signs involving the nervous system: Secondary | ICD-10-CM | POA: Diagnosis present

## 2023-04-12 DIAGNOSIS — G8250 Quadriplegia, unspecified: Secondary | ICD-10-CM | POA: Diagnosis not present

## 2023-04-12 DIAGNOSIS — R278 Other lack of coordination: Secondary | ICD-10-CM | POA: Diagnosis present

## 2023-04-12 DIAGNOSIS — R293 Abnormal posture: Secondary | ICD-10-CM | POA: Diagnosis present

## 2023-04-12 DIAGNOSIS — D329 Benign neoplasm of meninges, unspecified: Secondary | ICD-10-CM | POA: Diagnosis not present

## 2023-04-12 NOTE — Therapy (Signed)
 OUTPATIENT PHYSICAL THERAPY NEURO EVALUATION   Patient Name: Anthony Downs MRN: [id number] DOB:1950/06/15, 73 y.o., male Today's Date: 04/12/2023   PCP: Anthony Nevins, MD REFERRING PROVIDER: Ranelle Oyster, MD  END OF SESSION:  PT End of Session - 04/12/23 1316     Visit Number 1    Number of Visits 13   with eval   Date for PT Re-Evaluation 06/07/23    Authorization Type Medicare    PT Start Time 1315    PT Stop Time 1353    PT Time Calculation (min) 38 min    Equipment Utilized During Treatment Gait belt    Activity Tolerance Patient tolerated treatment well    Behavior During Therapy WFL for tasks assessed/performed             Past Medical History:  Diagnosis Date   Acid reflux    Anxiety    Arthritis    Asthma    as a child   Basal cell carcinoma 01/26/2021   RIGHT POST SURICULAR INFERIOR   Basal cell carcinoma 01/26/2021   RIGHT POST AURICULAR SUPERIOR   Benign brain tumor (HCC)    Chest pain    a. 09/2019 MV: No ischemia.   Complication of anesthesia    slow to wake up and pain medicines make him sick   GERD (gastroesophageal reflux disease)    History of DVT (deep vein thrombosis)    History of echocardiogram    a. 09/2019 Echo: EF 60-65%, nl RV fxn; b. 09/2022 Echo: EF of 60-65% without regional wall motion abnormalities, normal RV size/function, and borderline dilatation of the aortic root at 39 mm.   Hypercholesteremia    Hypertension    PONV (postoperative nausea and vomiting)    PVC's (premature ventricular contractions)    a. 08/2022 Zio: predominantly sinus rhythm with an average heart rate of 71 (50-150), 5 brief SVT runs (longest 10.2 seconds with max rate of 150).  3.2% PVC burden was also noted.  No triggered events.   Skin cancer    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, squamous behind ear   Sleep apnea    no Cpap   Subdural hematoma, post-traumatic (HCC) 2008   fell off truck hit head on concrete   Past Surgical History:   Procedure Laterality Date   APPLICATION OF CRANIAL NAVIGATION Left 04/29/2020   Procedure: APPLICATION OF CRANIAL NAVIGATION;  Surgeon: Anthony Pierini, MD;  Location: [facility] OR;  Service: Neurosurgery;  Laterality: Left;   arthroscopic knee     COLONOSCOPY N/A 11/29/2018   Procedure: COLONOSCOPY;  Surgeon: Anthony Hippo, MD;  Location: [facility];  Service: Endoscopy;  Laterality: N/A;  730-rescheduled 9/2 same time per Ann   CRANIOTOMY Left 04/29/2020   Procedure: LEFT CRANIECTOMY WITH TUMOR EXCISION;  Surgeon: Anthony Pierini, MD;  Location: [facility] OR;  Service: Neurosurgery;  Laterality: Left;   KNEE SURGERY Right    NASAL SEPTOPLASTY W/ TURBINOPLASTY Bilateral 03/19/2020   Procedure: NASAL SEPTOPLASTY WITH BILATERAL  TURBINATE REDUCTION;  Surgeon: Anthony Pies, MD;  Location: [facility] OR;  Service: ENT;  Laterality: Bilateral;   VASECTOMY     Patient Active Problem List   Diagnosis Date Noted   Current use of long term anticoagulation 05/06/2022   Seizure (HCC) 08/13/2020   Seizures (HCC) 08/12/2020   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   DVT (deep venous thrombosis) (HCC) 08/12/2020   COVID-19 virus infection 08/12/2020   Acute lower UTI 07/14/2020   Delirium  07/06/2020   Sleep apnea 07/06/2020   GERD (gastroesophageal reflux disease) 07/06/2020   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HCC) 06/25/2020   Sundowning 06/25/2020   Slow transit constipation    Essential hypertension    Hypokalemia    Postoperative pain    Meningioma (HCC) 05/02/2020   Brain tumor (HCC) 04/28/2020   S/P nasal septoplasty 03/19/2020   Special screening for malignant neoplasms, colon 01/09/2018    ONSET DATE: 04/03/2023  REFERRING DIAG: G82.50 (ICD-10-CM) - 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HCC) D32.9 (ICD-10-CM) - Meningioma (HCC)  THERAPY DIAG:  Unsteadiness on feet  Other abnormalities of gait and mobility  Abnormal posture  Muscle weakness (generalized)  Tremor  Other lack of coordination  Other symptoms and signs involving  the musculoskeletal system  Other symptoms and signs involving the nervous system  Rationale for Evaluation and Treatment: Rehabilitation  SUBJECTIVE:                                                                                                                                                                                             SUBJECTIVE STATEMENT: Pt familiar to this clinic, last seen for PT in March 2024. Pt presents in his PWC with his wife, Anthony Downs. Anthony Downs provides majority of history since last seen at this clinic. Pt has been working with HHPT and OT over the past few months and has made some progress regarding increased independence and improved RLE strength. Pt does have ongoing swelling in BLE but it is doing better. Pt able to demonstrate ability to flex his R hip and flex/ext his R knee which he had significant difficulty with during his previous POC. Pt is performing transfers at home with a stedy but is able to stand up to stedy independently. Pt has also been working on standing in his // bars at home, he can step with his LLE but not with his RLE.   Anthony Downs reports that he is more independent with bed mobility (brings his legs out of bed, just needs min A to sit up; does still need mod A to bring legs back into bed). Pt also is using his urinal independently during the night so he is not waking up Anthony Downs, wearing velcro pants. They have renovated their house so that the bathroom is handicap accessible, he does need some assistance standing up from the commode as it is low. He has a handicap accessible shower where he can transfer to the seat then the seat swivels into the shower.  Pt has also been working on weight loss with diet changes (has lost 20 lbs! - cut out Snickers and Dr. Reino Kent) and just started Jardience.  Pt  accompanied by: self and significant other wife Anthony Downs  PERTINENT HISTORY: anxiety, OA, cancer, subdural hematoma   PAIN:  Are you having pain?  No  PRECAUTIONS: Fall  RED FLAGS: None   WEIGHT BEARING RESTRICTIONS: No  FALLS: Has patient fallen in last 6 months? No  LIVING ENVIRONMENT: Lives with: lives with their spouse Lives in: House/apartment Home is power wheelchair accessible Has following equipment at home: Wheelchair (power), Ramped entry, and slide board, Corene Cornea, handicap accessible bathroom, hospital bed  PLOF: Independent with household mobility with device, Needs assistance with ADLs, and Needs assistance with transfers  PATIENT GOALS: "stand pivot transfer" "take steps in // bars"  OBJECTIVE:  Note: Objective measures were completed at Evaluation unless otherwise noted.  DIAGNOSTIC FINDINGS: None relevant to this POC  COGNITION: Overall cognitive status: Impaired   SENSATION: No N/T per patient report  EDEMA:  Yes in BLE, wears compression stockings  POSTURE: rounded shoulders, forward head, and posterior pelvic tilt  LOWER EXTREMITY ROM:     Passive  Right Eval Left Eval  Hip flexion Tight hip flexors Tight hip flexors  Hip extension    Hip abduction    Hip adduction    Hip internal rotation    Hip external rotation    Knee flexion    Knee extension Tight HS Tight HS  Ankle dorsiflexion Tight gastroc Tight gastroc  Ankle plantarflexion    Ankle inversion    Ankle eversion     (Blank rows = not tested)  LOWER EXTREMITY MMT:    MMT Right Eval Left Eval  Hip flexion 2- 3  Hip extension    Hip abduction    Hip adduction    Hip internal rotation    Hip external rotation    Knee flexion 2- 3  Knee extension 2- 3  Ankle dorsiflexion 0 1  Ankle plantarflexion    Ankle inversion    Ankle eversion    (Blank rows = not tested)  BED MOBILITY:  Sit to supine Mod A Supine to sit Min A Min A for trunk for supine to sit Mod A for BLE for sit to supine  TRANSFERS: Assistive device utilized:  stedy   Sit to stand: SBA Stand to sit: SBA Chair to chair:  dependent with use of  stedy                                                                                                                               TREATMENT: PT Evaluation   Self-Care/Home Management: Vitals:   04/12/23 1335  BP: (!) 140/77  Pulse: 66   Sit to stand in stedy with CGA. Pt able to tolerate standing x 2 min in stedy before sitting on perched stedy seat. Sit to stand with CGA from perched stedy seat. Pt returned to sitting in PWC. Pt able to scoot himself back in PWC with no physical assist needed.  PATIENT EDUCATION: Education details: Eval findings,  PT POC, goals for PT Person educated: Patient and Spouse Education method: Explanation Education comprehension: verbalized understanding and needs further education  HOME EXERCISE PROGRAM: To be initiated as appropriate  GOALS: Goals reviewed with patient? Yes  SHORT TERM GOALS: Target date: 05/05/2023  Pt will tolerate standing x 5 min with LRAD and CGA Baseline: x 2 min in stedy with CGA Goal status: INITIAL   LONG TERM GOALS: Target date: 05/26/2023   Pt will tolerate standing x 10 min with LRAD and CGA Baseline: x 2 min in stedy with CGA  Goal status: INITIAL  2.  Pt will perform stand pivot transfer with LRAD and mod A Baseline: transfers with stedy Goal status: INITIAL  3.  Pt will ambulate x 5 ft in // bars with assist x 2 for safety Baseline: able to take one step in // bars per family report Goal status: INITIAL   ASSESSMENT:  CLINICAL IMPRESSION: Patient is a 73 year old male referred to Neuro OPPT for tetraplegia secondary to his meningioma.   Pt's PMH is significant for: anxiety, OA, cancer, subdural hematoma.  The following deficits were present during the exam: decreased BLE strength and ROM, increased LE edema, decreased endurance, and cognitive impairments. Based on his strength and ROM impairments, pt is an increased risk for falls. Pt would benefit from skilled PT to address these impairments and  functional limitations to maximize functional mobility independence.   OBJECTIVE IMPAIRMENTS: decreased activity tolerance, decreased balance, decreased cognition, decreased endurance, decreased knowledge of condition, decreased knowledge of use of DME, decreased mobility, difficulty walking, decreased ROM, decreased strength, increased edema, and impaired UE functional use.   ACTIVITY LIMITATIONS: carrying, lifting, bending, standing, transfers, and bed mobility  PARTICIPATION LIMITATIONS: meal prep, cleaning, laundry, and community activity  PERSONAL FACTORS: Fitness, Time since onset of injury/illness/exacerbation, and 1-2 comorbidities:    anxiety, OA, cancer, subdural hematoma are also affecting patient's functional outcome.   REHAB POTENTIAL: Good  CLINICAL DECISION MAKING: Stable/uncomplicated  EVALUATION COMPLEXITY: High  PLAN:  PT FREQUENCY: 2x/week  PT DURATION: 6 weeks  PLANNED INTERVENTIONS: 97164- PT Re-evaluation, 97110-Therapeutic exercises, 97530- Therapeutic activity, 97112- Neuromuscular re-education, 97535- Self Care, 09811- Manual therapy, [phone]- Gait training, [phone]- Orthotic Fit/training, [phone]- Electrical stimulation (manual), Patient/Family education, Balance training, Taping, Dry Needling, Joint mobilization, Cognitive remediation, DME instructions, Wheelchair mobility training, Cryotherapy, and Moist heat  PLAN FOR NEXT SESSION: pre-gait in // bars (sit to stands, weight shift, forwards/backwards stepping) with progression to taking steps in // bars with w/c follow, standing to RW, and progressing to stand pivot transfers with RW (LLE moves better than RLE)   Peter Congo, PT Peter Congo, PT, DPT, CSRS  04/12/2023, 1:55 PM

## 2023-04-14 ENCOUNTER — Ambulatory Visit: Admitting: Physical Therapy

## 2023-04-14 DIAGNOSIS — M6281 Muscle weakness (generalized): Secondary | ICD-10-CM

## 2023-04-14 DIAGNOSIS — R2689 Other abnormalities of gait and mobility: Secondary | ICD-10-CM

## 2023-04-14 DIAGNOSIS — R2681 Unsteadiness on feet: Secondary | ICD-10-CM

## 2023-04-14 NOTE — Therapy (Signed)
 OUTPATIENT PHYSICAL THERAPY NEURO TREATMENT   Patient Name: Anthony Downs MRN: [id number] DOB:05-28-1950, 73 y.o., male Today's Date: 04/14/2023   PCP: Elfredia Nevins, MD REFERRING PROVIDER: Ranelle Oyster, MD  END OF SESSION:  PT End of Session - 04/14/23 1406     Visit Number 2    Number of Visits 13   with eval   Date for PT Re-Evaluation 06/07/23    Authorization Type Medicare    PT Start Time 1404    PT Stop Time 1444    PT Time Calculation (min) 40 min    Equipment Utilized During Treatment Gait belt    Activity Tolerance Patient tolerated treatment well    Behavior During Therapy WFL for tasks assessed/performed             Past Medical History:  Diagnosis Date   Acid reflux    Anxiety    Arthritis    Asthma    as a child   Basal cell carcinoma 01/26/2021   RIGHT POST SURICULAR INFERIOR   Basal cell carcinoma 01/26/2021   RIGHT POST AURICULAR SUPERIOR   Benign brain tumor (HCC)    Chest pain    a. 09/2019 MV: No ischemia.   Complication of anesthesia    slow to wake up and pain medicines make him sick   GERD (gastroesophageal reflux disease)    History of DVT (deep vein thrombosis)    History of echocardiogram    a. 09/2019 Echo: EF 60-65%, nl RV fxn; b. 09/2022 Echo: EF of 60-65% without regional wall motion abnormalities, normal RV size/function, and borderline dilatation of the aortic root at 39 mm.   Hypercholesteremia    Hypertension    PONV (postoperative nausea and vomiting)    PVC's (premature ventricular contractions)    a. 08/2022 Zio: predominantly sinus rhythm with an average heart rate of 71 (50-150), 5 brief SVT runs (longest 10.2 seconds with max rate of 150).  3.2% PVC burden was also noted.  No triggered events.   Skin cancer    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, squamous behind ear   Sleep apnea    no Cpap   Subdural hematoma, post-traumatic (HCC) 2008   fell off truck hit head on concrete   Past Surgical History:   Procedure Laterality Date   APPLICATION OF CRANIAL NAVIGATION Left 04/29/2020   Procedure: APPLICATION OF CRANIAL NAVIGATION;  Surgeon: Jadene Pierini, MD;  Location: [facility] OR;  Service: Neurosurgery;  Laterality: Left;   arthroscopic knee     COLONOSCOPY N/A 11/29/2018   Procedure: COLONOSCOPY;  Surgeon: Malissa Hippo, MD;  Location: [facility];  Service: Endoscopy;  Laterality: N/A;  730-rescheduled 9/2 same time per Ann   CRANIOTOMY Left 04/29/2020   Procedure: LEFT CRANIECTOMY WITH TUMOR EXCISION;  Surgeon: Jadene Pierini, MD;  Location: [facility] OR;  Service: Neurosurgery;  Laterality: Left;   KNEE SURGERY Right    NASAL SEPTOPLASTY W/ TURBINOPLASTY Bilateral 03/19/2020   Procedure: NASAL SEPTOPLASTY WITH BILATERAL  TURBINATE REDUCTION;  Surgeon: Newman Pies, MD;  Location: [facility] OR;  Service: ENT;  Laterality: Bilateral;   VASECTOMY     Patient Active Problem List   Diagnosis Date Noted   Current use of long term anticoagulation 05/06/2022   Seizure (HCC) 08/13/2020   Seizures (HCC) 08/12/2020   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   DVT (deep venous thrombosis) (HCC) 08/12/2020   COVID-19 virus infection 08/12/2020   Acute lower UTI 07/14/2020   Delirium  07/06/2020   Sleep apnea 07/06/2020   GERD (gastroesophageal reflux disease) 07/06/2020   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HCC) 06/25/2020   Sundowning 06/25/2020   Slow transit constipation    Essential hypertension    Hypokalemia    Postoperative pain    Meningioma (HCC) 05/02/2020   Brain tumor (HCC) 04/28/2020   S/P nasal septoplasty 03/19/2020   Special screening for malignant neoplasms, colon 01/09/2018    ONSET DATE: 04/03/2023  REFERRING DIAG: G82.50 (ICD-10-CM) - 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HCC) D32.9 (ICD-10-CM) - Meningioma (HCC)  THERAPY DIAG:  Muscle weakness (generalized)  Other abnormalities of gait and mobility  Unsteadiness on feet  Rationale for Evaluation and Treatment: Rehabilitation  SUBJECTIVE:                                                                                                                                                                                              SUBJECTIVE STATEMENT: Pt reports doing well. Denies pain or acute changes. Will work on "whatever" today.    Pt accompanied by: self and significant other wife [name]  PERTINENT HISTORY: anxiety, OA, cancer, subdural hematoma   PAIN:  Are you having pain? No  PRECAUTIONS: Fall  RED FLAGS: None   WEIGHT BEARING RESTRICTIONS: No  FALLS: Has patient fallen in last 6 months? No  LIVING ENVIRONMENT: Lives with: lives with their spouse Lives in: House/apartment Home is power wheelchair accessible Has following equipment at home: Wheelchair (power), Ramped entry, and slide board, Corene Cornea, handicap accessible bathroom, hospital bed  PLOF: Independent with household mobility with device, Needs assistance with ADLs, and Needs assistance with transfers  PATIENT GOALS: "stand pivot transfer" "take steps in // bars"  OBJECTIVE:  Note: Objective measures were completed at Evaluation unless otherwise noted.  DIAGNOSTIC FINDINGS: None relevant to this POC  COGNITION: Overall cognitive status: Impaired   SENSATION: No N/T per patient report  EDEMA:  Yes in BLE, wears compression stockings  POSTURE: rounded shoulders, forward head, and posterior pelvic tilt  LOWER EXTREMITY ROM:     Passive  Right Eval Left Eval  Hip flexion Tight hip flexors Tight hip flexors  Hip extension    Hip abduction    Hip adduction    Hip internal rotation    Hip external rotation    Knee flexion    Knee extension Tight HS Tight HS  Ankle dorsiflexion Tight gastroc Tight gastroc  Ankle plantarflexion    Ankle inversion    Ankle eversion     (Blank rows = not tested)  LOWER EXTREMITY MMT:    MMT Right Eval Left Eval  Hip flexion 2- 3  Hip extension  Hip abduction    Hip adduction    Hip internal rotation    Hip external  rotation    Knee flexion 2- 3  Knee extension 2- 3  Ankle dorsiflexion 0 1  Ankle plantarflexion    Ankle inversion    Ankle eversion    (Blank rows = not tested)  BED MOBILITY:  Sit to supine Mod A Supine to sit Min A Min A for trunk for supine to sit Mod A for BLE for sit to supine  TRANSFERS: Assistive device utilized:  stedy   Sit to stand: SBA Stand to sit: SBA Chair to chair:  dependent with use of stedy                                                                                                                               TREATMENT:  NMR With second therapist present, worked on sit to stands from power chair (on lowest height) to bariatric RW for improved functional strength, lateral weight shifting and independence w/transfers. Pt able to lift bilateral feet off foot plates independently:  Stand 1: Pt required min A for RLE placement and min A x2 to stand. Min cues to scoot hips towards edge of chair and proper hand placement. Pt able to hold stand for ~3 minutes w/multimodal cues to facilitate hip extension at top of stand. Min A for stand to sit due to decreased eccentric control  Stand 2: min A x2 to stand and placed two colored dots on floor as targets to tap. Pt able to tap w/LLE independently, but required mod A to tap w/RLE due to decreased weight shift to L and hip flexor weakness. Pt performed 2 taps per leg prior to needing to sit.  Stand 3: mod A x1 and min A x1 to stand due to fatigue. Placed shoe cover on RLE to assist w/toe taps, but due to fatigue, pt unable to perform. Pt able to tap w/LLE x1 but noted flexed posture w/inability to fully extend bilateral knees.  Provided lengthy seated rest break prior to performing stand #4 due to fatigue. Pt required mod A x2 to stand and held stand ~60s w/mod multimodal cues for proper posture  Pt required HHA x2 to scoot hips back into power chair following session but was able to place feet on foot rests independently.    PATIENT EDUCATION: Education details: Work on standing to the RW at home  Person educated: Patient and Spouse Education method: Medical illustrator Education comprehension: verbalized understanding  HOME EXERCISE PROGRAM: To be initiated as appropriate  GOALS: Goals reviewed with patient? Yes  SHORT TERM GOALS: Target date: 05/05/2023  Pt will tolerate standing x 5 min with LRAD and CGA Baseline: x 2 min in stedy with CGA Goal status: INITIAL   LONG TERM GOALS: Target date: 05/26/2023   Pt will tolerate standing x 10 min with LRAD and CGA Baseline: x 2 min in stedy with  CGA  Goal status: INITIAL  2.  Pt will perform stand pivot transfer with LRAD and mod A Baseline: transfers with stedy Goal status: INITIAL  3.  Pt will ambulate x 5 ft in // bars with assist x 2 for safety Baseline: able to take one step in // bars per family report Goal status: INITIAL   ASSESSMENT:  CLINICAL IMPRESSION: Emphasis of skilled PT session on functional transfers, weightbearing tolerance and lateral weight shifting. Pt able to perform sit to stand from power chair to RW w/min A x2 this date, but regressed to mod A x2 w/fatigue after several reps. Pt able to initiate lateral weight shift w/toe taps to colored dots, independently on LLE and w/min-mod A on RLE. Pt most limited by fatigue this date but overall was able to hold each stand for at least 60s while maintaining breathing. Continue POC.    OBJECTIVE IMPAIRMENTS: decreased activity tolerance, decreased balance, decreased cognition, decreased endurance, decreased knowledge of condition, decreased knowledge of use of DME, decreased mobility, difficulty walking, decreased ROM, decreased strength, increased edema, and impaired UE functional use.   ACTIVITY LIMITATIONS: carrying, lifting, bending, standing, transfers, and bed mobility  PARTICIPATION LIMITATIONS: meal prep, cleaning, laundry, and community activity  PERSONAL  FACTORS: Fitness, Time since onset of injury/illness/exacerbation, and 1-2 comorbidities:    anxiety, OA, cancer, subdural hematoma are also affecting patient's functional outcome.   REHAB POTENTIAL: Good  CLINICAL DECISION MAKING: Stable/uncomplicated  EVALUATION COMPLEXITY: High  PLAN:  PT FREQUENCY: 2x/week  PT DURATION: 6 weeks  PLANNED INTERVENTIONS: 97164- PT Re-evaluation, 97110-Therapeutic exercises, 97530- Therapeutic activity, 97112- Neuromuscular re-education, 97535- Self Care, 40981- Manual therapy, [phone]- Gait training, [phone]- Orthotic Fit/training, [phone]- Electrical stimulation (manual), Patient/Family education, Balance training, Taping, Dry Needling, Joint mobilization, Cognitive remediation, DME instructions, Wheelchair mobility training, Cryotherapy, and Moist heat  PLAN FOR NEXT SESSION: pre-gait in // bars (sit to stands, weight shift, forwards/backwards stepping) with progression to taking steps in // bars with w/c follow, standing to RW, and progressing to stand pivot transfers with RW (LLE moves better than RLE)   Jill Alexanders Skylee Baird, PT, DPT  04/14/2023, 2:48 PM

## 2023-04-19 ENCOUNTER — Ambulatory Visit: Admitting: Physical Therapy

## 2023-04-19 ENCOUNTER — Telehealth: Payer: Self-pay | Admitting: Urology

## 2023-04-19 MED ORDER — FINASTERIDE 5 MG PO TABS: 5.0000 mg | ORAL_TABLET | Freq: Every day | ORAL | 3 refills | Status: AC

## 2023-04-19 MED ORDER — TAMSULOSIN HCL 0.4 MG PO CAPS: 0.4000 mg | ORAL_CAPSULE | Freq: Every day | ORAL | 3 refills | Status: DC

## 2023-04-19 NOTE — Telephone Encounter (Signed)
 Needs finasteride and tamsulosin sent to [facility] they switched pharmacies

## 2023-04-20 ENCOUNTER — Encounter: Payer: Self-pay | Admitting: Physical Medicine & Rehabilitation

## 2023-04-20 ENCOUNTER — Encounter: Payer: Medicare Other | Attending: Physical Medicine & Rehabilitation | Admitting: Physical Medicine & Rehabilitation

## 2023-04-20 VITALS — BP 141/78 | HR 72 | Ht 73.0 in | Wt 334.0 lb

## 2023-04-20 DIAGNOSIS — G8250 Quadriplegia, unspecified: Secondary | ICD-10-CM | POA: Insufficient documentation

## 2023-04-20 DIAGNOSIS — D329 Benign neoplasm of meninges, unspecified: Secondary | ICD-10-CM | POA: Insufficient documentation

## 2023-04-20 MED ORDER — PROPRANOLOL HCL 20 MG PO TABS: 20.0000 mg | ORAL_TABLET | Freq: Three times a day (TID) | ORAL | Status: DC

## 2023-04-20 NOTE — Patient Instructions (Addendum)
 ALWAYS FEEL FREE TO CALL OUR OFFICE WITH ANY PROBLEMS OR QUESTIONS [phone])  **PLEASE NOTE** ALL MEDICATION REFILL REQUESTS (INCLUDING CONTROLLED SUBSTANCES) NEED TO BE MADE AT LEAST 7 DAYS PRIOR TO REFILL BEING DUE. ANY REFILL REQUESTS INSIDE THAT TIME FRAME MAY RESULT IN DELAYS IN RECEIVING YOUR PRESCRIPTION.     INCREASE PROPRANOLOL TO 20MG  THREE X DAILY.  IF THIS DOSE IS TOLERATED BUT DOESN'T HELP TREMORS, WE CAN INCREASE. JUST CALL ME.

## 2023-04-20 NOTE — Progress Notes (Signed)
 Subjective:    Patient ID: Anthony Downs, male    DOB: 22-Dec-1950, 73 y.o.   MRN: [id number]  HPI  Anthony Downs is here in follow up of his tetraplegia. For the most part he's been doing fairly well. He just started outpt PT at [address] this past week.  They were pleased to see the progress he's made. He's already been more able to assist with transfers at home. They use the Stedy primarily.   His wife got him a z-pack from his doctor as he was devloping a productive, yellow cough. It has helped with his cough already. He has been having more tremor as well which likely was related to his inhaler use.   Neurology took him off depakote for which he's happy!   Skin has remained intact. He developed a small area recently which wife addressed quickly. The are has resolved.   Bowels remain regular. No recent UTI's.   He is sleeping well. Mood is more positive. He's very thankful for those around, especially his wife, for all the things they have done to help him through this!!  Pain Inventory Average Pain 0 Pain Right Now 0 My pain is  no pain  In the last 24 hours, has pain interfered with the following? General activity 0 Relation with others 0 Enjoyment of life 0 What TIME of day is your pain at its worst? No pain Sleep (in general) Good  Pain is worse with:  no pain Pain improves with:  no pain Relief from Meds:  n/a  Family History  Problem Relation Age of Onset   Breast cancer Mother    Pancreatic cancer Mother    Aortic aneurysm Father    Social History   Socioeconomic History   Marital status: Married    Spouse name: Not on file   Number of children: 2   Years of education: 12   Highest education level: High school graduate  Occupational History   Occupation: Retired  Tobacco Use   Smoking status: Never   Smokeless tobacco: Never  Vaping Use   Vaping status: Never Used  Substance and Sexual Activity   Alcohol use: No    Comment: no use since 2008    Drug use: No   Sexual activity: Yes  Other Topics Concern   Not on file  Social History Narrative   Lives with wife.   Right-handed.   Two cans Dr. Reino Kent daily.   Social Drivers of Corporate investment banker Strain: High Risk (07/01/2020)   Received from [facility], [facility]   Overall Financial Resource Strain (CARDIA)    Difficulty of Paying Living Expenses: Very hard  Food Insecurity: Not on file  Transportation Needs: No Transportation Needs (07/01/2020)   Received from [facility], [facility]   PRAPARE - Transportation    Lack of Transportation (Medical): No    Lack of Transportation (Non-Medical): No  Physical Activity: Sufficiently Active (07/14/2017)   Received from [facility], [facility]   Exercise Vital Sign    Days of Exercise per Week: 3 days    Minutes of Exercise per Session: 70 min  Stress: Not on file  Social Connections: Not on file   Past Surgical History:  Procedure Laterality Date   APPLICATION OF CRANIAL NAVIGATION Left 04/29/2020   Procedure: APPLICATION OF CRANIAL NAVIGATION;  Surgeon: Jadene Pierini, MD;  Location: [facility] OR;  Service: Neurosurgery;  Laterality: Left;  arthroscopic knee     COLONOSCOPY N/A 11/29/2018   Procedure: COLONOSCOPY;  Surgeon: Malissa Hippo, MD;  [facility];  Service: Endoscopy;  Laterality: N/A;  730-rescheduled 9/2 same time per Ann   CRANIOTOMY Left 04/29/2020   Procedure: LEFT CRANIECTOMY WITH TUMOR EXCISION;  Surgeon: Jadene Pierini, MD;  Location: [facility] OR;  Service: Neurosurgery;  Laterality: Left;   KNEE SURGERY Right    NASAL SEPTOPLASTY W/ TURBINOPLASTY Bilateral 03/19/2020   Procedure: NASAL SEPTOPLASTY WITH BILATERAL  TURBINATE REDUCTION;  Surgeon: Newman Pies, MD;  Location: [facility] OR;  Service: ENT;  Laterality: Bilateral;   VASECTOMY     Past Surgical History:  Procedure Laterality Date   APPLICATION OF CRANIAL NAVIGATION Left 04/29/2020   Procedure: APPLICATION  OF CRANIAL NAVIGATION;  Surgeon: Jadene Pierini, MD;  Location: [facility] OR;  Service: Neurosurgery;  Laterality: Left;   arthroscopic knee     COLONOSCOPY N/A 11/29/2018   Procedure: COLONOSCOPY;  Surgeon: Malissa Hippo, MD;  [facility];  Service: Endoscopy;  Laterality: N/A;  730-rescheduled 9/2 same time per Ann   CRANIOTOMY Left 04/29/2020   Procedure: LEFT CRANIECTOMY WITH TUMOR EXCISION;  Surgeon: Jadene Pierini, MD;  Location: [facility] OR;  Service: Neurosurgery;  Laterality: Left;   KNEE SURGERY Right    NASAL SEPTOPLASTY W/ TURBINOPLASTY Bilateral 03/19/2020   Procedure: NASAL SEPTOPLASTY WITH BILATERAL  TURBINATE REDUCTION;  Surgeon: Newman Pies, MD;  Location: [facility];  Service: ENT;  Laterality: Bilateral;   VASECTOMY     Past Medical History:  Diagnosis Date   Acid reflux    Anxiety    Arthritis    Asthma    as a child   Basal cell carcinoma 01/26/2021   RIGHT POST SURICULAR INFERIOR   Basal cell carcinoma 01/26/2021   RIGHT POST AURICULAR SUPERIOR   Benign brain tumor (HCC)    Chest pain    a. 09/2019 MV: No ischemia.   Complication of anesthesia    slow to wake up and pain medicines make him sick   GERD (gastroesophageal reflux disease)    History of DVT (deep vein thrombosis)    History of echocardiogram    a. 09/2019 Echo: EF 60-65%, nl RV fxn; b. 09/2022 Echo: EF of 60-65% without regional wall motion abnormalities, normal RV size/function, and borderline dilatation of the aortic root at 39 mm.   Hypercholesteremia    Hypertension    PONV (postoperative nausea and vomiting)    PVC's (premature ventricular contractions)    a. 08/2022 Zio: predominantly sinus rhythm with an average heart rate of 71 (50-150), 5 brief SVT runs (longest 10.2 seconds with max rate of 150).  3.2% PVC burden was also noted.  No triggered events.   Skin cancer    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, squamous behind ear   Sleep apnea    no Cpap   Subdural hematoma, post-traumatic  (HCC) 2008   fell off truck hit head on concrete   BP (!) 141/78   Pulse 72   Ht 6\' 1"  (1.854 m)   Wt (!) 334 lb (151.5 kg) Comment: reported  SpO2 97%   BMI 44.07 kg/m   Opioid Risk Score:   Fall Risk Score:  `1  Depression screen [facility] 2/9     04/20/2023   11:28 AM 11/17/2022    8:40 AM 04/21/2022    2:13 PM 12/16/2021    2:12 PM 09/09/2021    2:43 PM 03/04/2021  2:35 PM 11/26/2020    2:00 PM  Depression screen PHQ 2/9  Decreased Interest 0 0 0 0 0 0 0  Down, Depressed, Hopeless 0 0 0 0 0 0 0  PHQ - 2 Score 0 0 0 0 0 0 0     Review of Systems  All other systems reviewed and are negative.      Objective:   Physical Exam  General: No acute distress HEENT: NCAT, EOMI, oral membranes moist Cards: reg rate  Chest: normal effort Abdomen: Soft, NT, ND Skin: dry, intact Extremities: 2+ LE edema R>L, wearing compression socks.  Psych: pleasant and appropriate  Skin: intact Neuro:very alert and oriented. Very attentive and engaging. Insight  and awareness continue to improve.  Upper extremity strength grossly  4 out of 5 on left, RUE 3/5 with tremor. Some limits in ROM. LLE 3- to 3/5.with KE/KF, 2/5  ADF/PF.  RLE 3/5.    Reflexes are still 3+. Intentional tremor in UE R>L remains Musculoskeletal:  good sitting posture. Right UE tight with flexion/rotation.          Assessment & Plan:  1. 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 Therapies already has noticed a difference in his strength      2.  Left posterior tibial vein DVT:   now on coumadin             -recent dopplers clear 3.  Sleep disorder/OSA             -continue CPAP 4.  Neurogenic bowel:  -recent program seems to be working ok             - metamucil twice daily (may use miralax also)..   -Continue senokot-s 5 x daily             - suppository or dig stim on  toilet if needed 5.  Urinary frequency with incontinence: frequent UTI's            -per urology            -flomax             -he remains continent 6.Seizures: off depakote!!!! 7.Tremor RUE>LUE: propranolol--once per day has not been enough  -increase to 20mg  tid  -has family hx of familial tremors  -consider aricept trial  -also discussed primidone 8. Swelling: he and his wife manage the best they can             -lasix per primary             -TEDS, elevation while in chair   20 minutes of face to face patient care time were spent during this visit. All questions were encouraged and answered. Follow up with me in 4 months

## 2023-04-21 ENCOUNTER — Ambulatory Visit: Payer: Self-pay | Admitting: Physical Therapy

## 2023-04-21 ENCOUNTER — Ambulatory Visit: Admitting: Physical Therapy

## 2023-04-26 ENCOUNTER — Ambulatory Visit: Attending: Physical Medicine & Rehabilitation | Admitting: Physical Therapy

## 2023-04-26 DIAGNOSIS — R2681 Unsteadiness on feet: Secondary | ICD-10-CM | POA: Diagnosis present

## 2023-04-26 DIAGNOSIS — M6281 Muscle weakness (generalized): Secondary | ICD-10-CM | POA: Diagnosis present

## 2023-04-26 DIAGNOSIS — R29898 Other symptoms and signs involving the musculoskeletal system: Secondary | ICD-10-CM | POA: Diagnosis present

## 2023-04-26 DIAGNOSIS — R29818 Other symptoms and signs involving the nervous system: Secondary | ICD-10-CM | POA: Insufficient documentation

## 2023-04-26 DIAGNOSIS — R293 Abnormal posture: Secondary | ICD-10-CM | POA: Diagnosis present

## 2023-04-26 DIAGNOSIS — R251 Tremor, unspecified: Secondary | ICD-10-CM | POA: Diagnosis present

## 2023-04-26 DIAGNOSIS — R2689 Other abnormalities of gait and mobility: Secondary | ICD-10-CM | POA: Diagnosis present

## 2023-04-26 NOTE — Therapy (Signed)
 OUTPATIENT PHYSICAL THERAPY NEURO TREATMENT   Patient Name: Anthony Downs MRN: [id number] DOB:March 28, 1950, 73 y.o., male Today's Date: 04/26/2023   PCP: Elfredia Nevins, MD REFERRING PROVIDER: Ranelle Oyster, MD  END OF SESSION:  PT End of Session - 04/26/23 1406     Visit Number 3    Number of Visits 13   with eval   Date for PT Re-Evaluation 06/07/23    Authorization Type Medicare    PT Start Time 1403    PT Stop Time 1442    PT Time Calculation (min) 39 min    Equipment Utilized During Treatment Gait belt    Activity Tolerance Patient limited by fatigue    Behavior During Therapy WFL for tasks assessed/performed              Past Medical History:  Diagnosis Date   Acid reflux    Anxiety    Arthritis    Asthma    as a child   Basal cell carcinoma 01/26/2021   RIGHT POST SURICULAR INFERIOR   Basal cell carcinoma 01/26/2021   RIGHT POST AURICULAR SUPERIOR   Benign brain tumor (HCC)    Chest pain    a. 09/2019 MV: No ischemia.   Complication of anesthesia    slow to wake up and pain medicines make him sick   GERD (gastroesophageal reflux disease)    History of DVT (deep vein thrombosis)    History of echocardiogram    a. 09/2019 Echo: EF 60-65%, nl RV fxn; b. 09/2022 Echo: EF of 60-65% without regional wall motion abnormalities, normal RV size/function, and borderline dilatation of the aortic root at 39 mm.   Hypercholesteremia    Hypertension    PONV (postoperative nausea and vomiting)    PVC's (premature ventricular contractions)    a. 08/2022 Zio: predominantly sinus rhythm with an average heart rate of 71 (50-150), 5 brief SVT runs (longest 10.2 seconds with max rate of 150).  3.2% PVC burden was also noted.  No triggered events.   Skin cancer    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, squamous behind ear   Sleep apnea    no Cpap   Subdural hematoma, post-traumatic (HCC) 2008   fell off truck hit head on concrete   Past Surgical History:  Procedure  Laterality Date   APPLICATION OF CRANIAL NAVIGATION Left 04/29/2020   Procedure: APPLICATION OF CRANIAL NAVIGATION;  Surgeon: Jadene Pierini, MD;  Location: [facility] OR;  Service: Neurosurgery;  Laterality: Left;   arthroscopic knee     COLONOSCOPY N/A 11/29/2018   Procedure: COLONOSCOPY;  Surgeon: Malissa Hippo, MD;  [facility];  Service: Endoscopy;  Laterality: N/A;  730-rescheduled 9/2 same time per Ann   CRANIOTOMY Left 04/29/2020   Procedure: LEFT CRANIECTOMY WITH TUMOR EXCISION;  Surgeon: Jadene Pierini, MD;  Location: [facility] OR;  Service: Neurosurgery;  Laterality: Left;   KNEE SURGERY Right    NASAL SEPTOPLASTY W/ TURBINOPLASTY Bilateral 03/19/2020   Procedure: NASAL SEPTOPLASTY WITH BILATERAL  TURBINATE REDUCTION;  Surgeon: Newman Pies, MD;  Location: [facility] OR;  Service: ENT;  Laterality: Bilateral;   VASECTOMY     Patient Active Problem List   Diagnosis Date Noted   Current use of long term anticoagulation 05/06/2022   Seizure (HCC) 08/13/2020   Seizures (HCC) 08/12/2020   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   DVT (deep venous thrombosis) (HCC) 08/12/2020   COVID-19 virus infection 08/12/2020   Acute lower UTI 07/14/2020  Delirium 07/06/2020   Sleep apnea 07/06/2020   GERD (gastroesophageal reflux disease) 07/06/2020   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HCC) 06/25/2020   Sundowning 06/25/2020   Slow transit constipation    Essential hypertension    Hypokalemia    Postoperative pain    Meningioma (HCC) 05/02/2020   Brain tumor (HCC) 04/28/2020   S/P nasal septoplasty 03/19/2020   Special screening for malignant neoplasms, colon 01/09/2018    ONSET DATE: 04/03/2023  REFERRING DIAG: G82.50 (ICD-10-CM) - 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HCC) D32.9 (ICD-10-CM) - Meningioma (HCC)  THERAPY DIAG:  Muscle weakness (generalized)  Other abnormalities of gait and mobility  Unsteadiness on feet  Rationale for Evaluation and Treatment: Rehabilitation  SUBJECTIVE:                                                                                                                                                                                              SUBJECTIVE STATEMENT: Pt reports high levels of fatigue. Denies pain or acute changes. Has not been able to do much at home since being sick. Has been in the [facility] all day, has been running a lot of errands. Bottom hurts due to being up in chair all day.    Pt accompanied by: self and significant other wife Anthony Downs  PERTINENT HISTORY: anxiety, OA, cancer, subdural hematoma   PAIN:  Are you having pain? Yes: NPRS scale: 3/10 Pain location: Butt Pain description: achy  PRECAUTIONS: Fall  RED FLAGS: None   WEIGHT BEARING RESTRICTIONS: No  FALLS: Has patient fallen in last 6 months? No  LIVING ENVIRONMENT: Lives with: lives with their spouse Lives in: House/apartment Home is power wheelchair accessible Has following equipment at home: Wheelchair (power), Ramped entry, and slide board, Corene Cornea, handicap accessible bathroom, hospital bed  PLOF: Independent with household mobility with device, Needs assistance with ADLs, and Needs assistance with transfers  PATIENT GOALS: "stand pivot transfer" "take steps in // bars"  OBJECTIVE:  Note: Objective measures were completed at Evaluation unless otherwise noted.  DIAGNOSTIC FINDINGS: None relevant to this POC  COGNITION: Overall cognitive status: Impaired   SENSATION: No N/T per patient report  EDEMA:  Yes in BLE, wears compression stockings  POSTURE: rounded shoulders, forward head, and posterior pelvic tilt  LOWER EXTREMITY ROM:     Passive  Right Eval Left Eval  Hip flexion Tight hip flexors Tight hip flexors  Hip extension    Hip abduction    Hip adduction    Hip internal rotation    Hip external rotation    Knee flexion    Knee extension Tight HS Tight HS  Ankle dorsiflexion Tight gastroc Tight  gastroc  Ankle plantarflexion    Ankle inversion    Ankle eversion      (Blank rows = not tested)  LOWER EXTREMITY MMT:    MMT Right Eval Left Eval  Hip flexion 2- 3  Hip extension    Hip abduction    Hip adduction    Hip internal rotation    Hip external rotation    Knee flexion 2- 3  Knee extension 2- 3  Ankle dorsiflexion 0 1  Ankle plantarflexion    Ankle inversion    Ankle eversion    (Blank rows = not tested)  BED MOBILITY:  Sit to supine Mod A Supine to sit Min A Min A for trunk for supine to sit Mod A for BLE for sit to supine  TRANSFERS: Assistive device utilized:  stedy   Sit to stand: SBA Stand to sit: SBA Chair to chair:  dependent with use of stedy                                                                                                                               TREATMENT:  NMR Drove pt's power chair into // bars as pt unable to do so due to wide width of power chair w/new joystick.  In // bars for improved weightbearing tolerance, endurance and transfers. Lengthy seated rest breaks required between stands due to fatigue. Pt requires max multimodal cues to place R foot w/each stand:  Stand 1: 2:16 w/SBA using mirror for visual biofeedback on body position. Mod verbal cues for full hip extension throughout and to keep hands closer to hips to facilitate upright posture. Noted significant weight shift to L side throughout.  Stand 2: 3:03 w/min A for hip extension and weight shift to R side.  Stand 3: 2:04, SBA, able to facilitate lateral weight shifts and stand on RLE for 5s.  Measured height of // bars (35") as pt reports his are too short at home and he wants them to be raised.   PATIENT EDUCATION: Education details: Work on standing to the RW at home Person educated: Patient and Spouse Education method: Medical illustrator Education comprehension: verbalized understanding  HOME EXERCISE PROGRAM: To be initiated as appropriate  GOALS: Goals reviewed with patient? Yes  SHORT TERM GOALS: Target date:  05/05/2023  Pt will tolerate standing x 5 min with LRAD and CGA Baseline: x 2 min in stedy with CGA Goal status: INITIAL   LONG TERM GOALS: Target date: 05/26/2023   Pt will tolerate standing x 10 min with LRAD and CGA Baseline: x 2 min in stedy with CGA  Goal status: INITIAL  2.  Pt will perform stand pivot transfer with LRAD and mod A Baseline: transfers with stedy Goal status: INITIAL  3.  Pt will ambulate x 5 ft in // bars with assist x 2 for safety Baseline: able to take one step in // bars per family report Goal status: INITIAL  ASSESSMENT:  CLINICAL IMPRESSION: Session limited due to pt fatigue. Emphasis of skilled PT session on transfers, lateral weight shifting and weightbearing tolerance. Pt able to perform stands in // bars w/CGA-min A but does demonstrate significant shift to L side, requiring min-mod tactile cues to facilitate shift to R side. Pt able to stand on RLE for ~5s while assuming march position of LLE, but cannot lift RLE. Encouraged pt to continue working on standing at home w/emphasis on endurance as we are working towards goal of standing for 10 minutes. Continue POC.    OBJECTIVE IMPAIRMENTS: decreased activity tolerance, decreased balance, decreased cognition, decreased endurance, decreased knowledge of condition, decreased knowledge of use of DME, decreased mobility, difficulty walking, decreased ROM, decreased strength, increased edema, and impaired UE functional use.   ACTIVITY LIMITATIONS: carrying, lifting, bending, standing, transfers, and bed mobility  PARTICIPATION LIMITATIONS: meal prep, cleaning, laundry, and community activity  PERSONAL FACTORS: Fitness, Time since onset of injury/illness/exacerbation, and 1-2 comorbidities:    anxiety, OA, cancer, subdural hematoma are also affecting patient's functional outcome.   REHAB POTENTIAL: Good  CLINICAL DECISION MAKING: Stable/uncomplicated  EVALUATION COMPLEXITY: High  PLAN:  PT FREQUENCY:  2x/week  PT DURATION: 6 weeks  PLANNED INTERVENTIONS: 97164- PT Re-evaluation, 97110-Therapeutic exercises, 97530- Therapeutic activity, 97112- Neuromuscular re-education, 97535- Self Care, 16109- Manual therapy, [phone]- Gait training, [phone]- Orthotic Fit/training, [phone]- Electrical stimulation (manual), Patient/Family education, Balance training, Taping, Dry Needling, Joint mobilization, Cognitive remediation, DME instructions, Wheelchair mobility training, Cryotherapy, and Moist heat  PLAN FOR NEXT SESSION: pre-gait in // bars (sit to stands, weight shift, forwards/backwards stepping) with progression to taking steps in // bars with w/c follow, standing to RW, and progressing to stand pivot transfers with RW (LLE moves better than RLE)   Jill Alexanders Onnie Hatchel, PT, DPT   04/26/2023, 2:43 PM

## 2023-04-27 ENCOUNTER — Ambulatory Visit: Payer: Medicare Other | Attending: Cardiology | Admitting: Cardiology

## 2023-04-27 ENCOUNTER — Encounter: Payer: Self-pay | Admitting: Cardiology

## 2023-04-27 VITALS — BP 126/80 | HR 73 | Ht 73.0 in | Wt 333.0 lb

## 2023-04-27 DIAGNOSIS — R0789 Other chest pain: Secondary | ICD-10-CM | POA: Diagnosis present

## 2023-04-27 DIAGNOSIS — I10 Essential (primary) hypertension: Secondary | ICD-10-CM | POA: Insufficient documentation

## 2023-04-27 DIAGNOSIS — E785 Hyperlipidemia, unspecified: Secondary | ICD-10-CM | POA: Diagnosis present

## 2023-04-27 DIAGNOSIS — I2510 Atherosclerotic heart disease of native coronary artery without angina pectoris: Secondary | ICD-10-CM | POA: Insufficient documentation

## 2023-04-27 NOTE — Progress Notes (Signed)
 Clinical Summary Anthony Downs is a 73 y.o.male  Previously followed [facility] cardiology, last seen 09/2021     1.Chest pain/rib pain - 09/2019 [facility] stress test: limited by arm movement, no clear ischemia - 09/2019 echo [facility]: LVEF 60-65% - 03/2022 CT chest coronary atherosclerosis ntoed  09/2022 echo: LVEF 60-65%,    - no recent chest pains.       2.Hyperlipidemia 04/2022 TC 164 TG 407 HDL 343 LDL 67 (nonfasting). TG had been 250s - working on dietary changes - has been on atorvastatin several years  - working on weight loss. Down about 24 lbs.  - labs followed by pcp     3. LE edema - ongoing edema - several year history -  09/2019 echo [facility]: LVEF 60-65%, indet diastolic, normal RV  - no recent issues.  - wears compression stockings, taking lasix just once daily.      4. HTN - compliant with meds   5. OSA     6. DM2    7. History of brain mass s/p resection - currently wheel chair bound, working in rehab   8. Tremors - has been on propranolol, followed by neurology  9. PVCs - prior monitor with 3.2% burden - no symptoms - HRs by dynamap are at times falsely low due to PVCs not being counted Past Medical History:  Diagnosis Date   Acid reflux    Anxiety    Arthritis    Asthma    as a child   Basal cell carcinoma 01/26/2021   RIGHT POST SURICULAR INFERIOR   Basal cell carcinoma 01/26/2021   RIGHT POST AURICULAR SUPERIOR   Benign brain tumor (HCC)    Chest pain    a. 09/2019 [facility]: No ischemia.   Complication of anesthesia    slow to wake up and pain medicines make him sick   GERD (gastroesophageal reflux disease)    History of DVT (deep vein thrombosis)    History of echocardiogram    a. 09/2019 Echo: EF 60-65%, nl RV fxn; b. 09/2022 Echo: EF of 60-65% without regional wall motion abnormalities, normal RV size/function, and borderline dilatation of the aortic root at 39 mm.   Hypercholesteremia    Hypertension    PONV (postoperative nausea and vomiting)     PVC's (premature ventricular contractions)    a. 08/2022 Zio: predominantly sinus rhythm with an average heart rate of 71 (50-150), 5 brief SVT runs (longest 10.2 seconds with max rate of 150).  3.2% PVC burden was also noted.  No triggered events.   Skin cancer    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, squamous behind ear   Sleep apnea    no Cpap   Subdural hematoma, post-traumatic (HCC) 2008   fell off truck hit head on concrete     Allergies  Allergen Reactions   Ativan [Lorazepam]     Worsening confusion   Haldol [Haloperidol]     Worsening confusion     Current Outpatient Medications  Medication Sig Dispense Refill   albuterol (VENTOLIN HFA) 108 (90 Base) MCG/ACT inhaler Inhale 2 puffs into the lungs every 4 (four) hours.     aspirin EC 81 MG tablet Take 81 mg by mouth daily. Swallow whole.     atorvastatin (LIPITOR) 10 MG tablet Take 1 tablet (10 mg total) by mouth at bedtime. 30 tablet 0   b complex vitamins capsule Take 1 capsule by mouth daily.     cetirizine (ZYRTEC) 10  MG tablet Take 10 mg by mouth daily.     cyanocobalamin (VITAMIN B12) 1000 MCG tablet Take 1,000 mcg by mouth daily.     empagliflozin (JARDIANCE) 10 MG TABS tablet Take 10 mg by mouth daily.     finasteride (PROSCAR) 5 MG tablet Take 1 tablet (5 mg total) by mouth daily. 90 tablet 3   furosemide (LASIX) 20 MG tablet Take 20 mg by mouth 2 (two) times daily.     glimepiride (AMARYL) 2 MG tablet Take 2 mg by mouth in the morning and at bedtime.     magnesium oxide (MAG-OX) 400 (240 Mg) MG tablet Take 1 tablet (400 mg total) by mouth daily. 90 tablet 3   pantoprazole (PROTONIX) 40 MG tablet Take 1 tablet (40 mg total) by mouth daily. 30 tablet 0   potassium chloride SA (KLOR-CON M) 20 MEQ tablet Take 20 mEq by mouth 2 (two) times daily.     propranolol (INDERAL) 20 MG tablet Take 1 tablet (20 mg total) by mouth 3 (three) times daily.     Propylene Glycol-Glycerin (SOOTHE OP) Apply 1 drop to eye daily as needed  (dry eyes).     silver sulfADIAZINE (SILVADENE) 1 % cream Apply 1 Application topically 2 (two) times daily. (Patient taking differently: Apply 1 Application topically daily.)     tamsulosin (FLOMAX) 0.4 MG CAPS capsule Take 1 capsule (0.4 mg total) by mouth daily after supper. 90 capsule 3   Vitamin D, Ergocalciferol, (DRISDOL) 1.25 MG (50000 UNIT) CAPS capsule Take 50,000 Units by mouth once a week.     zinc gluconate 50 MG tablet Take 50 mg by mouth daily.     No current facility-administered medications for this visit.     Past Surgical History:  Procedure Laterality Date   APPLICATION OF CRANIAL NAVIGATION Left 04/29/2020   Procedure: APPLICATION OF CRANIAL NAVIGATION;  Surgeon: Jadene Pierini, MD;  Location: [facility];  Service: Neurosurgery;  Laterality: Left;   arthroscopic knee     COLONOSCOPY N/A 11/29/2018   Procedure: COLONOSCOPY;  Surgeon: Malissa Hippo, MD;  [facility];  Service: Endoscopy;  Laterality: N/A;  730-rescheduled 9/2 same time per Ann   CRANIOTOMY Left 04/29/2020   Procedure: LEFT CRANIECTOMY WITH TUMOR EXCISION;  Surgeon: Jadene Pierini, MD;  Location: [facility] OR;  Service: Neurosurgery;  Laterality: Left;   KNEE SURGERY Right    NASAL SEPTOPLASTY W/ TURBINOPLASTY Bilateral 03/19/2020   Procedure: NASAL SEPTOPLASTY WITH BILATERAL  TURBINATE REDUCTION;  Surgeon: Newman Pies, MD;  Location: [facility] OR;  Service: ENT;  Laterality: Bilateral;   VASECTOMY       Allergies  Allergen Reactions   Ativan [Lorazepam]     Worsening confusion   Haldol [Haloperidol]     Worsening confusion      Family History  Problem Relation Age of Onset   Breast cancer Mother    Pancreatic cancer Mother    Aortic aneurysm Father      Social History Anthony Downs reports that he has never smoked. He has never used smokeless tobacco. Anthony Downs reports no history of alcohol use.   Physical Examination Today's Vitals   04/27/23 1010  BP: 126/80  Pulse: 73   SpO2: 95%  Weight: (!) 333 lb (151 kg)  Height: 6\' 1"  (1.854 m)  PainSc: 0-No pain   Body mass index is 43.93 kg/m.  Gen: resting comfortably, no acute distress HEENT: no scleral icterus, pupils equal round and reactive, no palptable cervical  adenopathy,  CV: RRR, no mrg, no jvd Resp: Clear to auscultation bilaterally GI: abdomen is soft, non-tender, non-distended, normal bowel sounds, no hepatosplenomegaly MSK: extremities are warm, no edema.  Skin: warm, no rash Neuro:  no focal deficits Psych: appropriate affect   Diagnostic Studies   Echo: 10/12/19 [facility] Summary 1. The left ventricle is normal in size with normal wall thickness. 2. The left ventricular systolic function is normal, LVEF is visually estimated at 60-65%. 3. The left atrium is mildly dilated in size. 4. The right ventricle is normal in size, with normal systolic function. Pulmonary systolic pressure cannot be estimated due to insufficient TR jet.      09/2019 stress test [facility] Stress test: 09/2019 IMPRESSIONS: - Equivocal myocardial perfusion study due to marked patient arm movement - There is a moderate sized, mildly severe, nearly completely reversible perfusion defect involving the apical inferior, mid inferior and basal inferior segments. This is consistent with possible artifact (attenuation, motion and misregistration of attenuation CT) but, cannot rule out ischemia. - LV ejection fraction at rest was 55%. - No significant coronary calcifications were noted on the attenuation CT - Incidentally noted on the attenuation CT scan is a 8 mm peripheral right lung nodule along with an incompletely visualized right upper lobe density (possibly artifact from motion) - Recommend dedicated full chest CT to evaluate lung nodules and given no or limited coronary calcium could consider coronary CTA    09/2022 echo 1. Patient in motorized chair. Suboptimal echo windows.   2. Left ventricular ejection fraction, by  estimation, is 60 to 65%. The  left ventricle has normal function. Left ventricular endocardial border  not optimally defined to evaluate regional wall motion. Left ventricular  diastolic function could not be  evaluated.   3. Right ventricular systolic function was not well visualized. The right  ventricular size is not well visualized. Tricuspid regurgitation signal is  inadequate for assessing PA pressure.   4. The mitral valve is grossly normal. No evidence of mitral valve  regurgitation. No evidence of mitral stenosis.   5. The aortic valve was not well visualized. Aortic valve regurgitation  is not visualized. No aortic stenosis is present.   6. Aortic dilatation noted. There is borderline dilatation of the aortic  root, measuring 39 mm.   08/2022 monitor 3 day monitor   Rare supraventricular ectopy in the form of isolated PACs, couplets, triplets. 5 runs of SVT longest lasting 10.2 seconds.   Occasional ventricular ectopy in the form of isolated PVCs. Rare couplets, triplets   No symptoms reported     Patch Wear Time:  2 days and 16 hours (2024-08-21T16:11:50-0400 to 2024-08-24T08:57:16-0400)   Patient had a min HR of 50 bpm, max HR of 150 bpm, and avg HR of 71 bpm. Predominant underlying rhythm was Sinus Rhythm. 5 Supraventricular Tachycardia runs occurred, the run with the fastest interval lasting 10.2 secs with a max rate of 150 bpm (avg 128  bpm); the run with the fastest interval was also the longest. Isolated SVEs were rare (<1.0%), SVE Couplets were rare (<1.0%), and SVE Triplets were rare (<1.0%). Isolated VEs were occasional (3.2%, 8596), VE Couplets were rare (<1.0%, 84), and VE  Triplets were rare (<1.0%, 2). Ventricular Bigeminy and Trigeminy were present.     Assessment and Plan  1.Chest pain/rib pain - noncardiac pains - 2021 nuclear stress without clear ischemia - no recent symptoms, continue to monitor   2. Coronary atherosclerosis - noted on CT scan -  prior stress was benign - no specific cardiac chest pains - continue ASA, statin.    3. Hyperlipidemia  Continue current meds, requset pcp labs   4. HTN -at goal, continue current meds  F/u 1 year       Antoine Poche, M.D.

## 2023-04-27 NOTE — Patient Instructions (Signed)
 Medication Instructions:  Your physician recommends that you continue on your current medications as directed. Please refer to the Current Medication list given to you today.  *If you need a refill on your cardiac medications before your next appointment, please call your pharmacy*  Lab Work: None If you have labs (blood work) drawn today and your tests are completely normal, you will receive your results only by: MyChart Message (if you have MyChart) OR A paper copy in the mail If you have any lab test that is abnormal or we need to change your treatment, we will call you to review the results.  Testing/Procedures: None  Follow-Up: At [facility], you and your health needs are our priority.  As part of our continuing mission to provide you with exceptional heart care, our providers are all part of one team.  This team includes your primary Cardiologist (physician) and Advanced Practice Providers or APPs (Physician Assistants and Nurse Practitioners) who all work together to provide you with the care you need, when you need it.  Your next appointment:   1 year(s)  Provider:   You may see Dina Rich, MD or one of the following Advanced Practice Providers on your designated Care Team:   Randall An, PA-C  [place], [place] Jacolyn Reedy, [place]     We recommend signing up for the patient portal called "MyChart".  Sign up information is provided on this After Visit Summary.  MyChart is used to connect with patients for Virtual Visits (Telemedicine).  Patients are able to view lab/test results, encounter notes, upcoming appointments, etc.  Non-urgent messages can be sent to your provider as well.   To learn more about what you can do with MyChart, go to [email or web address].   Other Instructions

## 2023-05-03 ENCOUNTER — Ambulatory Visit: Admitting: Physical Therapy

## 2023-05-03 DIAGNOSIS — R251 Tremor, unspecified: Secondary | ICD-10-CM

## 2023-05-03 DIAGNOSIS — M6281 Muscle weakness (generalized): Secondary | ICD-10-CM | POA: Diagnosis not present

## 2023-05-03 DIAGNOSIS — R2689 Other abnormalities of gait and mobility: Secondary | ICD-10-CM

## 2023-05-03 DIAGNOSIS — R293 Abnormal posture: Secondary | ICD-10-CM

## 2023-05-03 DIAGNOSIS — R2681 Unsteadiness on feet: Secondary | ICD-10-CM

## 2023-05-03 NOTE — Therapy (Signed)
 OUTPATIENT PHYSICAL THERAPY NEURO TREATMENT   Patient Name: Anthony Downs MRN: [id number] DOB:1950/05/24, 73 y.o., male Today's Date: 05/03/2023   PCP: Elfredia Nevins, MD REFERRING PROVIDER: Ranelle Oyster, MD  END OF SESSION:  PT End of Session - 05/03/23 1534     Visit Number 4    Number of Visits 13   with eval   Date for PT Re-Evaluation 06/07/23    Authorization Type Medicare    PT Start Time 1533    PT Stop Time 1620    PT Time Calculation (min) 47 min    Equipment Utilized During Treatment Gait belt    Activity Tolerance Patient tolerated treatment well    Behavior During Therapy WFL for tasks assessed/performed               Past Medical History:  Diagnosis Date   Acid reflux    Anxiety    Arthritis    Asthma    as a child   Basal cell carcinoma 01/26/2021   RIGHT POST SURICULAR INFERIOR   Basal cell carcinoma 01/26/2021   RIGHT POST AURICULAR SUPERIOR   Benign brain tumor (HCC)    Chest pain    a. 09/2019 MV: No ischemia.   Complication of anesthesia    slow to wake up and pain medicines make him sick   GERD (gastroesophageal reflux disease)    History of DVT (deep vein thrombosis)    History of echocardiogram    a. 09/2019 Echo: EF 60-65%, nl RV fxn; b. 09/2022 Echo: EF of 60-65% without regional wall motion abnormalities, normal RV size/function, and borderline dilatation of the aortic root at 39 mm.   Hypercholesteremia    Hypertension    PONV (postoperative nausea and vomiting)    PVC's (premature ventricular contractions)    a. 08/2022 Zio: predominantly sinus rhythm with an average heart rate of 71 (50-150), 5 brief SVT runs (longest 10.2 seconds with max rate of 150).  3.2% PVC burden was also noted.  No triggered events.   Skin cancer    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, squamous behind ear   Sleep apnea    no Cpap   Subdural hematoma, post-traumatic (HCC) 2008   fell off truck hit head on concrete   Past Surgical History:   Procedure Laterality Date   APPLICATION OF CRANIAL NAVIGATION Left 04/29/2020   Procedure: APPLICATION OF CRANIAL NAVIGATION;  Surgeon: Jadene Pierini, MD;  Location: [facility] OR;  Service: Neurosurgery;  Laterality: Left;   arthroscopic knee     COLONOSCOPY N/A 11/29/2018   Procedure: COLONOSCOPY;  Surgeon: Malissa Hippo, MD;  [facility];  Service: Endoscopy;  Laterality: N/A;  730-rescheduled 9/2 same time per Ann   CRANIOTOMY Left 04/29/2020   Procedure: LEFT CRANIECTOMY WITH TUMOR EXCISION;  Surgeon: Jadene Pierini, MD;  Location: [facility] OR;  Service: Neurosurgery;  Laterality: Left;   KNEE SURGERY Right    NASAL SEPTOPLASTY W/ TURBINOPLASTY Bilateral 03/19/2020   Procedure: NASAL SEPTOPLASTY WITH BILATERAL  TURBINATE REDUCTION;  Surgeon: Newman Pies, MD;  Location: [facility] OR;  Service: ENT;  Laterality: Bilateral;   VASECTOMY     Patient Active Problem List   Diagnosis Date Noted   Current use of long term anticoagulation 05/06/2022   Seizure (HCC) 08/13/2020   Seizures (HCC) 08/12/2020   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   DVT (deep venous thrombosis) (HCC) 08/12/2020   COVID-19 virus infection 08/12/2020   Acute lower UTI 07/14/2020  Delirium 07/06/2020   Sleep apnea 07/06/2020   GERD (gastroesophageal reflux disease) 07/06/2020   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HCC) 06/25/2020   Sundowning 06/25/2020   Slow transit constipation    Essential hypertension    Hypokalemia    Postoperative pain    Meningioma (HCC) 05/02/2020   Brain tumor (HCC) 04/28/2020   S/P nasal septoplasty 03/19/2020   Special screening for malignant neoplasms, colon 01/09/2018    ONSET DATE: 04/03/2023  REFERRING DIAG: G82.50 (ICD-10-CM) - 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HCC) D32.9 (ICD-10-CM) - Meningioma (HCC)  THERAPY DIAG:  Muscle weakness (generalized)  Other abnormalities of gait and mobility  Unsteadiness on feet  Abnormal posture  Tremor  Rationale for Evaluation and Treatment:  Rehabilitation  SUBJECTIVE:                                                                                                                                                                                             SUBJECTIVE STATEMENT: Pt denies any acute changes since last visit. Pt reports his buttocks continues to be sore, skin is peeling.   Pt accompanied by: self and significant other wife [name]  PERTINENT HISTORY: anxiety, OA, cancer, subdural hematoma   PAIN:  Are you having pain? Yes: NPRS scale: 1/10 Pain location: Butt Pain description: achy  PRECAUTIONS: Fall  RED FLAGS: None   WEIGHT BEARING RESTRICTIONS: No  FALLS: Has patient fallen in last 6 months? No  LIVING ENVIRONMENT: Lives with: lives with their spouse Lives in: House/apartment Home is power wheelchair accessible Has following equipment at home: Wheelchair (power), Ramped entry, and slide board, Corene Cornea, handicap accessible bathroom, hospital bed  PLOF: Independent with household mobility with device, Needs assistance with ADLs, and Needs assistance with transfers  PATIENT GOALS: "stand pivot transfer" "take steps in // bars"  OBJECTIVE:  Note: Objective measures were completed at Evaluation unless otherwise noted.  DIAGNOSTIC FINDINGS: None relevant to this POC  COGNITION: Overall cognitive status: Impaired   SENSATION: No N/T per patient report  EDEMA:  Yes in BLE, wears compression stockings  POSTURE: rounded shoulders, forward head, and posterior pelvic tilt  LOWER EXTREMITY ROM:     Passive  Right Eval Left Eval  Hip flexion Tight hip flexors Tight hip flexors  Hip extension    Hip abduction    Hip adduction    Hip internal rotation    Hip external rotation    Knee flexion    Knee extension Tight HS Tight HS  Ankle dorsiflexion Tight gastroc Tight gastroc  Ankle plantarflexion    Ankle inversion    Ankle eversion     (Blank rows = not tested)  LOWER EXTREMITY MMT:     MMT Right Eval Left Eval  Hip flexion 2- 3  Hip extension    Hip abduction    Hip adduction    Hip internal rotation    Hip external rotation    Knee flexion 2- 3  Knee extension 2- 3  Ankle dorsiflexion 0 1  Ankle plantarflexion    Ankle inversion    Ankle eversion    (Blank rows = not tested)  BED MOBILITY:  Sit to supine Mod A Supine to sit Min A Min A for trunk for supine to sit Mod A for BLE for sit to supine  TRANSFERS: Assistive device utilized:  stedy   Sit to stand: SBA Stand to sit: SBA Chair to chair:  dependent with use of stedy                                                                                                                               TREATMENT:  NMR In // bars for improved weightbearing tolerance, endurance and transfers. Lengthy seated rest breaks required between stands due to fatigue. Pt requires max multimodal cues to place R foot w/each stand but is only SBA for actual sit to stand transfer:  Ambulation 3 x 5 ft in // bars with some assist to block R knee and max A to advance R limb with use of leg loop, shoe cover to assist with advancement of limb Pt exhibits improved lateral weight shift as compared to previous session Pt exhibits improved advancement of RLE during 2nd bout of ambulation, decreased physical assist needed to advance limb and improved lateral weight shift onto LLE Standing x 2:37 during 3rd stand, did not time previous trials Pt able to take one step backwards during 3rd trial, difficulty stepping back with RLE Pt with onset of tremors in L lat during 3rd stand that subside in sitting  PATIENT EDUCATION: Education details: Work on standing to the [employer] at home Person educated: Patient and Spouse Education method: Medical illustrator Education comprehension: verbalized understanding  HOME EXERCISE PROGRAM: To be initiated as appropriate  GOALS: Goals reviewed with patient? Yes  SHORT TERM GOALS:  Target date: 05/05/2023  Pt will tolerate standing x 5 min with LRAD and CGA Baseline: x 2 min in stedy with CGA Goal status: INITIAL   LONG TERM GOALS: Target date: 05/26/2023   Pt will tolerate standing x 10 min with LRAD and CGA Baseline: x 2 min in stedy with CGA  Goal status: INITIAL  2.  Pt will perform stand pivot transfer with LRAD and mod A Baseline: transfers with stedy Goal status: INITIAL  3.  Pt will ambulate x 5 ft in // bars with assist x 2 for safety Baseline: able to take one step in // bars per family report Goal status: INITIAL   ASSESSMENT:  CLINICAL IMPRESSION: Emphasis of skilled PT session on sit to stand transfers in // bars with  progression to short-distance gait in // bars. Pt exhibits improved overall function this date with progress to short-distance gait in // bars. He also exhibits improved control of his RLE with improved ability to advance limb as session progresses and exhibits improved lateral weight shift L/R. Pt does exhibit increase in trunk flexion with onset of fatigue and has significant difficulty taking steps backwards with RLE. Pt continues to benefit from skilled PT services to work towards LTGs. Continue POC.    OBJECTIVE IMPAIRMENTS: decreased activity tolerance, decreased balance, decreased cognition, decreased endurance, decreased knowledge of condition, decreased knowledge of use of DME, decreased mobility, difficulty walking, decreased ROM, decreased strength, increased edema, and impaired UE functional use.   ACTIVITY LIMITATIONS: carrying, lifting, bending, standing, transfers, and bed mobility  PARTICIPATION LIMITATIONS: meal prep, cleaning, laundry, and community activity  PERSONAL FACTORS: Fitness, Time since onset of injury/illness/exacerbation, and 1-2 comorbidities:    anxiety, OA, cancer, subdural hematoma are also affecting patient's functional outcome.   REHAB POTENTIAL: Good  CLINICAL DECISION MAKING:  Stable/uncomplicated  EVALUATION COMPLEXITY: High  PLAN:  PT FREQUENCY: 2x/week  PT DURATION: 6 weeks  PLANNED INTERVENTIONS: 97164- PT Re-evaluation, 97110-Therapeutic exercises, 97530- Therapeutic activity, 97112- Neuromuscular re-education, 97535- Self Care, 60454- Manual therapy, [phone]- Gait training, [phone]- Orthotic Fit/training, [phone]- Electrical stimulation (manual), Patient/Family education, Balance training, Taping, Dry Needling, Joint mobilization, Cognitive remediation, DME instructions, Wheelchair mobility training, Cryotherapy, and Moist heat  PLAN FOR NEXT SESSION: pre-gait in // bars (sit to stands, weight shift, forwards/backwards stepping) with progression to taking steps in // bars with w/c follow, standing to RW, and progressing to stand pivot transfers with RW (LLE moves better than RLE)   Lorita Rosa, PT Lorita Rosa, PT, DPT, CSRS    05/03/2023, 4:23 PM

## 2023-05-04 ENCOUNTER — Encounter: Payer: Self-pay | Admitting: Physical Medicine & Rehabilitation

## 2023-05-04 MED ORDER — CARBIDOPA-LEVODOPA 10-100 MG PO TABS: 1.0000 | ORAL_TABLET | Freq: Three times a day (TID) | ORAL | 3 refills | Status: DC

## 2023-05-05 ENCOUNTER — Ambulatory Visit: Admitting: Physical Therapy

## 2023-05-05 DIAGNOSIS — M6281 Muscle weakness (generalized): Secondary | ICD-10-CM

## 2023-05-05 DIAGNOSIS — R2681 Unsteadiness on feet: Secondary | ICD-10-CM

## 2023-05-05 DIAGNOSIS — R2689 Other abnormalities of gait and mobility: Secondary | ICD-10-CM

## 2023-05-05 NOTE — Therapy (Signed)
 OUTPATIENT PHYSICAL THERAPY NEURO TREATMENT   Patient Name: Anthony Downs MRN: [id number] DOB:1950/08/08, 73 y.o., male Today's Date: 05/05/2023   PCP: Kathyleen Parkins, MD REFERRING PROVIDER: Rawland Caddy, MD  END OF SESSION:  PT End of Session - 05/05/23 1534     Visit Number 5    Number of Visits 13   with eval   Date for PT Re-Evaluation 06/07/23    Authorization Type Medicare    PT Start Time 1533    PT Stop Time 1615    PT Time Calculation (min) 42 min    Equipment Utilized During Treatment Gait belt    Activity Tolerance Patient tolerated treatment well    Behavior During Therapy WFL for tasks assessed/performed                Past Medical History:  Diagnosis Date   Acid reflux    Anxiety    Arthritis    Asthma    as a child   Basal cell carcinoma 01/26/2021   RIGHT POST SURICULAR INFERIOR   Basal cell carcinoma 01/26/2021   RIGHT POST AURICULAR SUPERIOR   Benign brain tumor (HCC)    Chest pain    a. 09/2019 MV: No ischemia.   Complication of anesthesia    slow to wake up and pain medicines make him sick   GERD (gastroesophageal reflux disease)    History of DVT (deep vein thrombosis)    History of echocardiogram    a. 09/2019 Echo: EF 60-65%, nl RV fxn; b. 09/2022 Echo: EF of 60-65% without regional wall motion abnormalities, normal RV size/function, and borderline dilatation of the aortic root at 39 mm.   Hypercholesteremia    Hypertension    PONV (postoperative nausea and vomiting)    PVC's (premature ventricular contractions)    a. 08/2022 Zio: predominantly sinus rhythm with an average heart rate of 71 (50-150), 5 brief SVT runs (longest 10.2 seconds with max rate of 150).  3.2% PVC burden was also noted.  No triggered events.   Skin cancer    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, squamous behind ear   Sleep apnea    no Cpap   Subdural hematoma, post-traumatic (HCC) 2008   fell off truck hit head on concrete   Past Surgical History:   Procedure Laterality Date   APPLICATION OF CRANIAL NAVIGATION Left 04/29/2020   Procedure: APPLICATION OF CRANIAL NAVIGATION;  Surgeon: Cannon Champion, MD;  Location: [facility] OR;  Service: Neurosurgery;  Laterality: Left;   arthroscopic knee     COLONOSCOPY N/A 11/29/2018   Procedure: COLONOSCOPY;  Surgeon: Ruby Corporal, MD;  Location: [facility];  Service: Endoscopy;  Laterality: N/A;  730-rescheduled 9/2 same time per Ann   CRANIOTOMY Left 04/29/2020   Procedure: LEFT CRANIECTOMY WITH TUMOR EXCISION;  Surgeon: Cannon Champion, MD;  [facility] OR;  Service: Neurosurgery;  Laterality: Left;   KNEE SURGERY Right    NASAL SEPTOPLASTY W/ TURBINOPLASTY Bilateral 03/19/2020   Procedure: NASAL SEPTOPLASTY WITH BILATERAL  TURBINATE REDUCTION;  Surgeon: Reynold Caves, MD;  Location: [facility] OR;  Service: ENT;  Laterality: Bilateral;   VASECTOMY     Patient Active Problem List   Diagnosis Date Noted   Current use of long term anticoagulation 05/06/2022   Seizure (HCC) 08/13/2020   Seizures (HCC) 08/12/2020   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   DVT (deep venous thrombosis) (HCC) 08/12/2020   COVID-19 virus infection 08/12/2020   Acute lower UTI 07/14/2020  Delirium 07/06/2020   Sleep apnea 07/06/2020   GERD (gastroesophageal reflux disease) 07/06/2020   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HCC) 06/25/2020   Sundowning 06/25/2020   Slow transit constipation    Essential hypertension    Hypokalemia    Postoperative pain    Meningioma (HCC) 05/02/2020   Brain tumor (HCC) 04/28/2020   S/P nasal septoplasty 03/19/2020   Special screening for malignant neoplasms, colon 01/09/2018    ONSET DATE: 04/03/2023  REFERRING DIAG: G82.50 (ICD-10-CM) - 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HCC) D32.9 (ICD-10-CM) - Meningioma (HCC)  THERAPY DIAG:  Muscle weakness (generalized)  Other abnormalities of gait and mobility  Unsteadiness on feet  Rationale for Evaluation and Treatment: Rehabilitation  SUBJECTIVE:                                                                                                                                                                                              SUBJECTIVE STATEMENT: Pt reports he was prescribed Sinemet  for his tremors, has not started it yet. Buttocks is hurting him today. [name] to contact [facility] to get pressure mapping done for better cushion.    Pt accompanied by: self and significant other wife [name]  PERTINENT HISTORY: anxiety, OA, cancer, subdural hematoma   PAIN:  Are you having pain? Yes: NPRS scale: 1/10 Pain location: Butt Pain description: achy  PRECAUTIONS: Fall  RED FLAGS: None   WEIGHT BEARING RESTRICTIONS: No  FALLS: Has patient fallen in last 6 months? No  LIVING ENVIRONMENT: Lives with: lives with their spouse Lives in: House/apartment Home is power wheelchair accessible Has following equipment at home: Wheelchair (power), Ramped entry, and slide board, [name], handicap accessible bathroom, hospital bed  PLOF: Independent with household mobility with device, Needs assistance with ADLs, and Needs assistance with transfers  PATIENT GOALS: "stand pivot transfer" "take steps in // bars"  OBJECTIVE:  Note: Objective measures were completed at Evaluation unless otherwise noted.  DIAGNOSTIC FINDINGS: None relevant to this POC  COGNITION: Overall cognitive status: Impaired   SENSATION: No N/T per patient report  EDEMA:  Yes in BLE, wears compression stockings  POSTURE: rounded shoulders, forward head, and posterior pelvic tilt  LOWER EXTREMITY ROM:     Passive  Right Eval Left Eval  Hip flexion Tight hip flexors Tight hip flexors  Hip extension    Hip abduction    Hip adduction    Hip internal rotation    Hip external rotation    Knee flexion    Knee extension Tight HS Tight HS  Ankle dorsiflexion Tight gastroc Tight gastroc  Ankle plantarflexion    Ankle inversion    Ankle eversion     (  Blank rows = not tested)  LOWER  EXTREMITY MMT:    MMT Right Eval Left Eval  Hip flexion 2- 3  Hip extension    Hip abduction    Hip adduction    Hip internal rotation    Hip external rotation    Knee flexion 2- 3  Knee extension 2- 3  Ankle dorsiflexion 0 1  Ankle plantarflexion    Ankle inversion    Ankle eversion    (Blank rows = not tested)  BED MOBILITY:  Sit to supine Mod A Supine to sit Min A Min A for trunk for supine to sit Mod A for BLE for sit to supine  TRANSFERS: Assistive device utilized:  stedy   Sit to stand: SBA Stand to sit: SBA Chair to chair:  dependent with use of stedy                                                                                                                               TREATMENT:  NMR  Gait pattern: step to pattern, decreased step length- Right, decreased stride length, decreased hip/knee flexion- Right, decreased ankle dorsiflexion- Right, knee flexed in stance- Right, lateral hip instability, trunk flexed, narrow BOS, and poor foot clearance- Right Distance walked: 14', 20' and 18' Assistive device utilized: Environmental consultant - 2 wheeled Level of assistance:  one therapist providing mod -max A to progress RLE and one providing CGA-min A during gait and transfers at pelvis. Close WC follow provided by [name] Comments: Placed shoe cover and leg loop on RLE to assist w/progression of RLE. Pt performed sit to stand from power WC to bariatric RW x, requiring CGA on initial rep, then min A and finally mod A on final rep due to fatigue. Pt frequently scissoring LLE, requiring mod cues for wider foot placement to prevent lateral lean and instability to L side. Pt w/improved weight shift to L and able to initiate swing phase independently on RLE but requires min-mod A to fully bring leg forward due to hip flexor weakness. Pt able to ambulate w/turns to L side and required min A to assist w/progression and stability of RW. Pt averaging ~2 minutes of weightbearing time w/each walk.    At end of session, pt performed final sit to stand w/mod A and held stand for 2:08 to work on weightbearing tolerance and endurance when fatigued. Mod multimodal cues to maintain upright posture as pt very fatigued and wanting to lean over RW.    PATIENT EDUCATION: Education details: Continue to work on standing to the RW at home Person educated: Patient and Spouse Education method: Medical illustrator Education comprehension: verbalized understanding  HOME EXERCISE PROGRAM: To be initiated as appropriate  GOALS: Goals reviewed with patient? Yes  SHORT TERM GOALS: Target date: 05/05/2023  Pt will tolerate standing x 5 min with LRAD and CGA Baseline: x 2 min in stedy with CGA; x2 minutes w/RW and CGA (4/17) Goal  status: IN PROGRESS    LONG TERM GOALS: Target date: 05/26/2023   Pt will tolerate standing x 10 min with LRAD and CGA Baseline: x 2 min in stedy with CGA  Goal status: INITIAL  2.  Pt will perform stand pivot transfer with LRAD and mod A Baseline: transfers with stedy Goal status: INITIAL  3.  Pt will ambulate x 5 ft in // bars with assist x 2 for safety Baseline: able to take one step in // bars per family report Goal status: INITIAL   ASSESSMENT:  CLINICAL IMPRESSION: Emphasis of skilled PT session on STG assessment and gait training w/RW. Pt ambulated a total of 52' this date and demonstrated improved lateral weight shift and ability to initiate swing of RLE this date. Pt able to lift R heel off ground to initiate swing but requires min-mod A to fully bring leg forward. Pt averaged ~2 minutes of walking w/each bout and then held stand for 2 minutes at end of session when fatigued. Pt is progressing well towards goal of standing for 5 minutes w/CGA but has not quite met goal yet. Continue POC.    OBJECTIVE IMPAIRMENTS: decreased activity tolerance, decreased balance, decreased cognition, decreased endurance, decreased knowledge of condition, decreased  knowledge of use of DME, decreased mobility, difficulty walking, decreased ROM, decreased strength, increased edema, and impaired UE functional use.   ACTIVITY LIMITATIONS: carrying, lifting, bending, standing, transfers, and bed mobility  PARTICIPATION LIMITATIONS: meal prep, cleaning, laundry, and community activity  PERSONAL FACTORS: Fitness, Time since onset of injury/illness/exacerbation, and 1-2 comorbidities:    anxiety, OA, cancer, subdural hematoma are also affecting patient's functional outcome.   REHAB POTENTIAL: Good  CLINICAL DECISION MAKING: Stable/uncomplicated  EVALUATION COMPLEXITY: High  PLAN:  PT FREQUENCY: 2x/week  PT DURATION: 6 weeks  PLANNED INTERVENTIONS: 97164- PT Re-evaluation, 97110-Therapeutic exercises, 97530- Therapeutic activity, 97112- Neuromuscular re-education, 97535- Self Care, 16109- Manual therapy, [phone]- Gait training, [phone]- Orthotic Fit/training, [phone]- Electrical stimulation (manual), Patient/Family education, Balance training, Taping, Dry Needling, Joint mobilization, Cognitive remediation, DME instructions, Wheelchair mobility training, Cryotherapy, and Moist heat  PLAN FOR NEXT SESSION: pre-gait in // bars (sit to stands, weight shift, forwards/backwards stepping) with progression to taking steps in // bars with w/c follow, standing to RW, and progressing to stand pivot transfers with RW (LLE moves better than RLE)   Curtistine Downer Kajuan Guyton, PT, DPT  05/05/2023, 4:25 PM

## 2023-05-10 ENCOUNTER — Ambulatory Visit: Admitting: Physical Therapy

## 2023-05-10 DIAGNOSIS — M6281 Muscle weakness (generalized): Secondary | ICD-10-CM

## 2023-05-10 DIAGNOSIS — R2689 Other abnormalities of gait and mobility: Secondary | ICD-10-CM

## 2023-05-10 DIAGNOSIS — R2681 Unsteadiness on feet: Secondary | ICD-10-CM

## 2023-05-10 DIAGNOSIS — R293 Abnormal posture: Secondary | ICD-10-CM

## 2023-05-10 NOTE — Therapy (Signed)
 OUTPATIENT PHYSICAL THERAPY NEURO TREATMENT   Patient Name: Anthony Downs MRN: [id number] DOB:10/13/1950, 73 y.o., male Today's Date: 05/10/2023   PCP: Anthony Parkins, MD REFERRING PROVIDER: Rawland Caddy, MD  END OF SESSION:  PT End of Session - 05/10/23 1319     Visit Number 6    Number of Visits 13   with eval   Date for PT Re-Evaluation 06/07/23    Authorization Type Medicare    PT Start Time 1317    PT Stop Time 1359    PT Time Calculation (min) 42 min    Equipment Utilized During Treatment Gait belt    Activity Tolerance Patient tolerated treatment well    Behavior During Therapy WFL for tasks assessed/performed                 Past Medical History:  Diagnosis Date   Acid reflux    Anxiety    Arthritis    Asthma    as a child   Basal cell carcinoma 01/26/2021   RIGHT POST SURICULAR INFERIOR   Basal cell carcinoma 01/26/2021   RIGHT POST AURICULAR SUPERIOR   Benign brain tumor (HCC)    Chest pain    a. 09/2019 MV: No ischemia.   Complication of anesthesia    slow to wake up and pain medicines make him sick   GERD (gastroesophageal reflux disease)    History of DVT (deep vein thrombosis)    History of echocardiogram    a. 09/2019 Echo: EF 60-65%, nl RV fxn; b. 09/2022 Echo: EF of 60-65% without regional wall motion abnormalities, normal RV size/function, and borderline dilatation of the aortic root at 39 mm.   Hypercholesteremia    Hypertension    PONV (postoperative nausea and vomiting)    PVC's (premature ventricular contractions)    a. 08/2022 Zio: predominantly sinus rhythm with an average heart rate of 71 (50-150), 5 brief SVT runs (longest 10.2 seconds with max rate of 150).  3.2% PVC burden was also noted.  No triggered events.   Skin cancer    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, squamous behind ear   Sleep apnea    no Cpap   Subdural hematoma, post-traumatic (HCC) 2008   fell off truck hit head on concrete   Past Surgical History:   Procedure Laterality Date   APPLICATION OF CRANIAL NAVIGATION Left 04/29/2020   Procedure: APPLICATION OF CRANIAL NAVIGATION;  Surgeon: Anthony Champion, MD;  Location: [facility] OR;  Service: Neurosurgery;  Laterality: Left;   arthroscopic knee     COLONOSCOPY N/A 11/29/2018   Procedure: COLONOSCOPY;  Surgeon: Anthony Corporal, MD;  Location: [facility];  Service: Endoscopy;  Laterality: N/A;  730-rescheduled 9/2 same time per Ann   CRANIOTOMY Left 04/29/2020   Procedure: LEFT CRANIECTOMY WITH TUMOR EXCISION;  Surgeon: Anthony Champion, MD;  Location: [facility] OR;  Service: Neurosurgery;  Laterality: Left;   KNEE SURGERY Right    NASAL SEPTOPLASTY W/ TURBINOPLASTY Bilateral 03/19/2020   Procedure: NASAL SEPTOPLASTY WITH BILATERAL  TURBINATE REDUCTION;  Surgeon: Anthony Caves, MD;  Location: [facility] OR;  Service: ENT;  Laterality: Bilateral;   VASECTOMY     Patient Active Problem List   Diagnosis Date Noted   Current use of long term anticoagulation 05/06/2022   Seizure (HCC) 08/13/2020   Seizures (HCC) 08/12/2020   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   DVT (deep venous thrombosis) (HCC) 08/12/2020   COVID-19 virus infection 08/12/2020   Acute lower UTI  07/14/2020   Delirium 07/06/2020   Sleep apnea 07/06/2020   GERD (gastroesophageal reflux disease) 07/06/2020   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HCC) 06/25/2020   Sundowning 06/25/2020   Slow transit constipation    Essential hypertension    Hypokalemia    Postoperative pain    Meningioma (HCC) 05/02/2020   Brain tumor (HCC) 04/28/2020   S/P nasal septoplasty 03/19/2020   Special screening for malignant neoplasms, colon 01/09/2018    ONSET DATE: 04/03/2023  REFERRING DIAG: G82.50 (ICD-10-CM) - 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HCC) D32.9 (ICD-10-CM) - Meningioma (HCC)  THERAPY DIAG:  Muscle weakness (generalized)  Other abnormalities of gait and mobility  Unsteadiness on feet  Abnormal posture  Rationale for Evaluation and Treatment: Rehabilitation  SUBJECTIVE:                                                                                                                                                                                              SUBJECTIVE STATEMENT: Pt reports he has been taking Sinemet  for 4 days, thinks it is helping. No falls. Buttocks feels better    Pt accompanied by: self and significant other wife Anthony Downs  PERTINENT HISTORY: anxiety, OA, cancer, subdural hematoma   PAIN:  Are you having pain? Yes: NPRS scale: 1/10 Pain location: Butt Pain description: achy  PRECAUTIONS: Fall  RED FLAGS: None   WEIGHT BEARING RESTRICTIONS: No  FALLS: Has patient fallen in last 6 months? No  LIVING ENVIRONMENT: Lives with: lives with their spouse Lives in: House/apartment Home is power wheelchair accessible Has following equipment at home: Wheelchair (power), Ramped entry, and slide board, Anthony Downs, handicap accessible bathroom, hospital bed  PLOF: Independent with household mobility with device, Needs assistance with ADLs, and Needs assistance with transfers  PATIENT GOALS: "stand pivot transfer" "take steps in // bars"  OBJECTIVE:  Note: Objective measures were completed at Evaluation unless otherwise noted.  DIAGNOSTIC FINDINGS: None relevant to this POC  COGNITION: Overall cognitive status: Impaired   SENSATION: No N/T per patient report  EDEMA:  Yes in BLE, wears compression stockings  POSTURE: rounded shoulders, forward head, and posterior pelvic tilt  LOWER EXTREMITY ROM:     Passive  Right Eval Left Eval  Hip flexion Tight hip flexors Tight hip flexors  Hip extension    Hip abduction    Hip adduction    Hip internal rotation    Hip external rotation    Knee flexion    Knee extension Tight HS Tight HS  Ankle dorsiflexion Tight gastroc Tight gastroc  Ankle plantarflexion    Ankle inversion    Ankle eversion     (Blank rows = not  tested)  LOWER EXTREMITY MMT:    MMT Right Eval Left Eval  Hip  flexion 2- 3  Hip extension    Hip abduction    Hip adduction    Hip internal rotation    Hip external rotation    Knee flexion 2- 3  Knee extension 2- 3  Ankle dorsiflexion 0 1  Ankle plantarflexion    Ankle inversion    Ankle eversion    (Blank rows = not tested)  BED MOBILITY:  Sit to supine Mod A Supine to sit Min A Min A for trunk for supine to sit Mod A for BLE for sit to supine  TRANSFERS: Assistive device utilized:  stedy   Sit to stand: SBA Stand to sit: SBA Chair to chair:  dependent with use of stedy                                                                                                                               TREATMENT:  NMR Pt performed sit > stand pivot from WC to elevated mat (turn to R) w/bariatric RW and CGA x1 and min-mod A x1 (assisting w/R foot). Pt performed well but was very fatigued following activity. Pt performed w/shoe cover and leg loop on RLE.   Gait pattern: step to pattern, decreased step length- Right, decreased stride length, decreased hip/knee flexion- Right, decreased ankle dorsiflexion- Right, knee flexed in stance- Right, lateral hip instability, trunk flexed, narrow BOS, and poor foot clearance- Right Distance walked: 8', 8' and 7' Assistive device utilized: Environmental consultant - 2 wheeled Level of assistance:  one therapist providing mod -max A to progress RLE and one providing CGA-min A during gait and transfers at pelvis. Close WC follow provided by Anthony Downs Comments: Placed shoe cover and leg loop on RLE to assist w/progression of RLE. Pt performed sit to stand from power WC to bariatric RW x3, requiring min A today due to fatigue. Noted reduced scissoring of LLE this date. Pt required min-light mod A to progress RLE but had poor postural control today, frequently leaning over RW, requiring mod verbal cues for upright posture.     PATIENT EDUCATION: Education details: Continue to work on standing to the RW at home Person educated:  Patient and Spouse Education method: Medical illustrator Education comprehension: verbalized understanding  HOME EXERCISE PROGRAM: To be initiated as appropriate  GOALS: Goals reviewed with patient? Yes  SHORT TERM GOALS: Target date: 05/05/2023  Pt will tolerate standing x 5 min with LRAD and CGA Baseline: x 2 min in stedy with CGA; x2 minutes w/RW and CGA (4/17) Goal status: IN PROGRESS    LONG TERM GOALS: Target date: 05/26/2023   Pt will tolerate standing x 10 min with LRAD and CGA Baseline: x 2 min in stedy with CGA  Goal status: INITIAL  2.  Pt will perform stand pivot transfer with LRAD and mod A Baseline: transfers with stedy Goal status: INITIAL  3.  Pt will ambulate x 5 ft in // bars with assist x 2 for safety Baseline: able to take one step in // bars per family report Goal status: INITIAL   ASSESSMENT:  CLINICAL IMPRESSION: Session limited as pt more fatigued today. Pt tolerated short bouts of standing/walking well and performed first stand pivot w/RW and min-mod A to assist w/RLE. Pt w/decreased postural control today due to fatigue but did reduce scissoring of LLE w/gait. Pt ambulated a total of 23' today w/RW and required min-light mod A to progress RLE. Continue POC.    OBJECTIVE IMPAIRMENTS: decreased activity tolerance, decreased balance, decreased cognition, decreased endurance, decreased knowledge of condition, decreased knowledge of use of DME, decreased mobility, difficulty walking, decreased ROM, decreased strength, increased edema, and impaired UE functional use.   ACTIVITY LIMITATIONS: carrying, lifting, bending, standing, transfers, and bed mobility  PARTICIPATION LIMITATIONS: meal prep, cleaning, laundry, and community activity  PERSONAL FACTORS: Fitness, Time since onset of injury/illness/exacerbation, and 1-2 comorbidities:    anxiety, OA, cancer, subdural hematoma are also affecting patient's functional outcome.   REHAB POTENTIAL:  Good  CLINICAL DECISION MAKING: Stable/uncomplicated  EVALUATION COMPLEXITY: High  PLAN:  PT FREQUENCY: 2x/week  PT DURATION: 6 weeks  PLANNED INTERVENTIONS: 97164- PT Re-evaluation, 97110-Therapeutic exercises, 97530- Therapeutic activity, 97112- Neuromuscular re-education, 97535- Self Care, 16109- Manual therapy, [phone]- Gait training, [phone]- Orthotic Fit/training, [phone]- Electrical stimulation (manual), Patient/Family education, Balance training, Taping, Dry Needling, Joint mobilization, Cognitive remediation, DME instructions, Wheelchair mobility training, Cryotherapy, and Moist heat  PLAN FOR NEXT SESSION: Work on Oncologist, retro gait, continues gait training w/RW (w/shoe cover and leg loop on RLE).    Ahsha Hinsley E Vannie Hilgert, PT, DPT  05/10/2023, 2:01 PM

## 2023-05-12 ENCOUNTER — Ambulatory Visit: Admitting: Physical Therapy

## 2023-05-12 DIAGNOSIS — R2681 Unsteadiness on feet: Secondary | ICD-10-CM

## 2023-05-12 DIAGNOSIS — M6281 Muscle weakness (generalized): Secondary | ICD-10-CM

## 2023-05-12 DIAGNOSIS — R2689 Other abnormalities of gait and mobility: Secondary | ICD-10-CM

## 2023-05-12 NOTE — Therapy (Signed)
 OUTPATIENT PHYSICAL THERAPY NEURO TREATMENT   Patient Name: Anthony Downs MRN: [id number] DOB:02/26/1950, 73 y.o., male Today's Date: 05/12/2023   PCP: Kathyleen Parkins, MD REFERRING PROVIDER: Rawland Caddy, MD  END OF SESSION:  PT End of Session - 05/12/23 1448     Visit Number 7    Number of Visits 13   with eval   Date for PT Re-Evaluation 06/07/23    Authorization Type Medicare    PT Start Time 1446    PT Stop Time 1529    PT Time Calculation (min) 43 min    Equipment Utilized During Treatment Gait belt;Other (comment)   shoe cover and leg loop   Activity Tolerance Patient tolerated treatment well    Behavior During Therapy [facility] for tasks assessed/performed                  Past Medical History:  Diagnosis Date   Acid reflux    Anxiety    Arthritis    Asthma    as a child   Basal cell carcinoma 01/26/2021   RIGHT POST SURICULAR INFERIOR   Basal cell carcinoma 01/26/2021   RIGHT POST AURICULAR SUPERIOR   Benign brain tumor (HCC)    Chest pain    a. 09/2019 MV: No ischemia.   Complication of anesthesia    slow to wake up and pain medicines make him sick   GERD (gastroesophageal reflux disease)    History of DVT (deep vein thrombosis)    History of echocardiogram    a. 09/2019 Echo: EF 60-65%, nl RV fxn; b. 09/2022 Echo: EF of 60-65% without regional wall motion abnormalities, normal RV size/function, and borderline dilatation of the aortic root at 39 mm.   Hypercholesteremia    Hypertension    PONV (postoperative nausea and vomiting)    PVC's (premature ventricular contractions)    a. 08/2022 Zio: predominantly sinus rhythm with an average heart rate of 71 (50-150), 5 brief SVT runs (longest 10.2 seconds with max rate of 150).  3.2% PVC burden was also noted.  No triggered events.   Skin cancer    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, squamous behind ear   Sleep apnea    no Cpap   Subdural hematoma, post-traumatic (HCC) 2008   fell off truck hit  head on concrete   Past Surgical History:  Procedure Laterality Date   APPLICATION OF CRANIAL NAVIGATION Left 04/29/2020   Procedure: APPLICATION OF CRANIAL NAVIGATION;  Surgeon: Cannon Champion, MD;  Location: [facility] OR;  Service: Neurosurgery;  Laterality: Left;   arthroscopic knee     COLONOSCOPY N/A 11/29/2018   Procedure: COLONOSCOPY;  Surgeon: Ruby Corporal, MD;  Location: [facility];  Service: Endoscopy;  Laterality: N/A;  730-rescheduled 9/2 same time per Ann   CRANIOTOMY Left 04/29/2020   Procedure: LEFT CRANIECTOMY WITH TUMOR EXCISION;  Surgeon: Cannon Champion, MD;  Location: [facility] OR;  Service: Neurosurgery;  Laterality: Left;   KNEE SURGERY Right    NASAL SEPTOPLASTY W/ TURBINOPLASTY Bilateral 03/19/2020   Procedure: NASAL SEPTOPLASTY WITH BILATERAL  TURBINATE REDUCTION;  Surgeon: Reynold Caves, MD;  Location: [facility] OR;  Service: ENT;  Laterality: Bilateral;   VASECTOMY     Patient Active Problem List   Diagnosis Date Noted   Current use of long term anticoagulation 05/06/2022   Seizure (HCC) 08/13/2020   Seizures (HCC) 08/12/2020   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   DVT (deep venous thrombosis) (HCC) 08/12/2020   COVID-19  virus infection 08/12/2020   Acute lower UTI 07/14/2020   Delirium 07/06/2020   Sleep apnea 07/06/2020   GERD (gastroesophageal reflux disease) 07/06/2020   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HCC) 06/25/2020   Sundowning 06/25/2020   Slow transit constipation    Essential hypertension    Hypokalemia    Postoperative pain    Meningioma (HCC) 05/02/2020   Brain tumor (HCC) 04/28/2020   S/P nasal septoplasty 03/19/2020   Special screening for malignant neoplasms, colon 01/09/2018    ONSET DATE: 04/03/2023  REFERRING DIAG: G82.50 (ICD-10-CM) - 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HCC) D32.9 (ICD-10-CM) - Meningioma (HCC)  THERAPY DIAG:  Muscle weakness (generalized)  Other abnormalities of gait and mobility  Unsteadiness on feet  Rationale for Evaluation and Treatment:  Rehabilitation  SUBJECTIVE:                                                                                                                                                                                             SUBJECTIVE STATEMENT: Pt reports doing okay. Has developed a dry cough but otherwise no changes. No pain today.    Pt accompanied by: self and significant other wife [name]  PERTINENT HISTORY: anxiety, OA, cancer, subdural hematoma   PAIN:  Are you having pain? No  PRECAUTIONS: Fall  RED FLAGS: None   WEIGHT BEARING RESTRICTIONS: No  FALLS: Has patient fallen in last 6 months? No  LIVING ENVIRONMENT: Lives with: lives with their spouse Lives in: House/apartment Home is power wheelchair accessible Has following equipment at home: Wheelchair (power), Ramped entry, and slide board, [name], handicap accessible bathroom, hospital bed  PLOF: Independent with household mobility with device, Needs assistance with ADLs, and Needs assistance with transfers  PATIENT GOALS: "stand pivot transfer" "take steps in // bars"  OBJECTIVE:  Note: Objective measures were completed at Evaluation unless otherwise noted.  DIAGNOSTIC FINDINGS: None relevant to this POC  COGNITION: Overall cognitive status: Impaired   SENSATION: No N/T per patient report  EDEMA:  Yes in BLE, wears compression stockings  POSTURE: rounded shoulders, forward head, and posterior pelvic tilt  LOWER EXTREMITY ROM:     Passive  Right Eval Left Eval  Hip flexion Tight hip flexors Tight hip flexors  Hip extension    Hip abduction    Hip adduction    Hip internal rotation    Hip external rotation    Knee flexion    Knee extension Tight HS Tight HS  Ankle dorsiflexion Tight gastroc Tight gastroc  Ankle plantarflexion    Ankle inversion    Ankle eversion     (Blank rows = not tested)  LOWER EXTREMITY MMT:  MMT Right Eval Left Eval  Hip flexion 2- 3  Hip extension    Hip  abduction    Hip adduction    Hip internal rotation    Hip external rotation    Knee flexion 2- 3  Knee extension 2- 3  Ankle dorsiflexion 0 1  Ankle plantarflexion    Ankle inversion    Ankle eversion    (Blank rows = not tested)  BED MOBILITY:  Sit to supine Mod A Supine to sit Min A Min A for trunk for supine to sit Mod A for BLE for sit to supine  TRANSFERS: Assistive device utilized:  stedy   Sit to stand: SBA Stand to sit: SBA Chair to chair:  dependent with use of stedy                                                                                                                               TREATMENT:  NMR Pt performed sit > stand pivot from WC to elevated mat (turn to R) w/bariatric RW and CGA x1 and min-mod A x1 (assisting w/R foot). Pt performed well but was very fatigued following activity. Pt performed w/shoe cover and leg loop on RLE.   Gait pattern: step to pattern, decreased step length- Right, decreased stride length, decreased hip/knee flexion- Right, decreased ankle dorsiflexion- Right, knee flexed in stance- Right, lateral hip instability, trunk flexed, narrow BOS, and poor foot clearance- Right Distance walked: 8', 8' and 7' Assistive device utilized: Environmental consultant - 2 wheeled Level of assistance:  one therapist providing mod -max A to progress RLE and one providing CGA-min A during gait and transfers at pelvis. Close WC follow provided by [name] Comments: Placed shoe cover and leg loop on RLE to assist w/progression of RLE. Pt performed sit to stand from power WC to bariatric RW x3, requiring min A today due to fatigue. Noted reduced scissoring of LLE this date. Pt required min-light mod A to progress RLE but had poor postural control today, frequently leaning over RW, requiring mod verbal cues for upright posture.     PATIENT EDUCATION: Education details: Continue to work on standing to the RW at home Person educated: Patient and Spouse Education method:  Medical illustrator Education comprehension: verbalized understanding  HOME EXERCISE PROGRAM: To be initiated as appropriate  GOALS: Goals reviewed with patient? Yes  SHORT TERM GOALS: Target date: 05/05/2023  Pt will tolerate standing x 5 min with LRAD and CGA Baseline: x 2 min in stedy with CGA; x2 minutes w/RW and CGA (4/17) Goal status: IN PROGRESS    LONG TERM GOALS: Target date: 05/26/2023   Pt will tolerate standing x 10 min with LRAD and CGA Baseline: x 2 min in stedy with CGA  Goal status: INITIAL  2.  Pt will perform stand pivot transfer with LRAD and mod A Baseline: transfers with stedy Goal status: INITIAL  3.  Pt will ambulate x 5 ft  in // bars with assist x 2 for safety Baseline: able to take one step in // bars per family report Goal status: INITIAL   ASSESSMENT:  CLINICAL IMPRESSION: Session limited as pt more fatigued today. Pt tolerated short bouts of standing/walking well and performed first stand pivot w/RW and min-mod A to assist w/RLE. Pt w/decreased postural control today due to fatigue but did reduce scissoring of LLE w/gait. Pt ambulated a total of 23' today w/RW and required min-light mod A to progress RLE. Continue POC.    OBJECTIVE IMPAIRMENTS: decreased activity tolerance, decreased balance, decreased cognition, decreased endurance, decreased knowledge of condition, decreased knowledge of use of DME, decreased mobility, difficulty walking, decreased ROM, decreased strength, increased edema, and impaired UE functional use.   ACTIVITY LIMITATIONS: carrying, lifting, bending, standing, transfers, and bed mobility  PARTICIPATION LIMITATIONS: meal prep, cleaning, laundry, and community activity  PERSONAL FACTORS: Fitness, Time since onset of injury/illness/exacerbation, and 1-2 comorbidities:    anxiety, OA, cancer, subdural hematoma are also affecting patient's functional outcome.   REHAB POTENTIAL: Good  CLINICAL DECISION MAKING:  Stable/uncomplicated  EVALUATION COMPLEXITY: High  PLAN:  PT FREQUENCY: 2x/week  PT DURATION: 6 weeks  PLANNED INTERVENTIONS: 97164- PT Re-evaluation, 97110-Therapeutic exercises, 97530- Therapeutic activity, 97112- Neuromuscular re-education, 97535- Self Care, 16109- Manual therapy, [phone]- Gait training, [phone]- Orthotic Fit/training, [phone]- Electrical stimulation (manual), Patient/Family education, Balance training, Taping, Dry Needling, Joint mobilization, Cognitive remediation, DME instructions, Wheelchair mobility training, Cryotherapy, and Moist heat  PLAN FOR NEXT SESSION: Work on Oncologist, retro gait, continues gait training w/RW (w/shoe cover and leg loop on RLE).    Blanton Kardell E Orean Giarratano, PT, DPT  05/12/2023, 3:30 PM

## 2023-05-17 ENCOUNTER — Ambulatory Visit: Admitting: Physical Therapy

## 2023-05-17 ENCOUNTER — Encounter: Payer: Self-pay | Admitting: Physical Medicine & Rehabilitation

## 2023-05-17 DIAGNOSIS — R29818 Other symptoms and signs involving the nervous system: Secondary | ICD-10-CM

## 2023-05-17 DIAGNOSIS — M6281 Muscle weakness (generalized): Secondary | ICD-10-CM

## 2023-05-17 DIAGNOSIS — R2681 Unsteadiness on feet: Secondary | ICD-10-CM

## 2023-05-17 DIAGNOSIS — R293 Abnormal posture: Secondary | ICD-10-CM

## 2023-05-17 DIAGNOSIS — R2689 Other abnormalities of gait and mobility: Secondary | ICD-10-CM

## 2023-05-17 DIAGNOSIS — R29898 Other symptoms and signs involving the musculoskeletal system: Secondary | ICD-10-CM

## 2023-05-17 NOTE — Therapy (Signed)
 OUTPATIENT PHYSICAL THERAPY NEURO TREATMENT   Patient Name: Anthony Downs MRN: [id number] DOB:07/24/1950, 73 y.o., male Today's Date: 05/17/2023   PCP: Kathyleen Parkins, MD REFERRING PROVIDER: Rawland Caddy, MD  END OF SESSION:  PT End of Session - 05/17/23 1316     Visit Number 8    Number of Visits 13   with eval   Date for PT Re-Evaluation 06/07/23    Authorization Type Medicare    PT Start Time 1315    PT Stop Time 1400    PT Time Calculation (min) 45 min    Equipment Utilized During Treatment Gait belt;Other (comment)   shoe cover and leg loop   Activity Tolerance Patient tolerated treatment well    Behavior During Therapy [facility] for tasks assessed/performed                   Past Medical History:  Diagnosis Date   Acid reflux    Anxiety    Arthritis    Asthma    as a child   Basal cell carcinoma 01/26/2021   RIGHT POST SURICULAR INFERIOR   Basal cell carcinoma 01/26/2021   RIGHT POST AURICULAR SUPERIOR   Benign brain tumor (HCC)    Chest pain    a. 09/2019 MV: No ischemia.   Complication of anesthesia    slow to wake up and pain medicines make him sick   GERD (gastroesophageal reflux disease)    History of DVT (deep vein thrombosis)    History of echocardiogram    a. 09/2019 Echo: EF 60-65%, nl RV fxn; b. 09/2022 Echo: EF of 60-65% without regional wall motion abnormalities, normal RV size/function, and borderline dilatation of the aortic root at 39 mm.   Hypercholesteremia    Hypertension    PONV (postoperative nausea and vomiting)    PVC's (premature ventricular contractions)    a. 08/2022 Zio: predominantly sinus rhythm with an average heart rate of 71 (50-150), 5 brief SVT runs (longest 10.2 seconds with max rate of 150).  3.2% PVC burden was also noted.  No triggered events.   Skin cancer    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, squamous behind ear   Sleep apnea    no Cpap   Subdural hematoma, post-traumatic (HCC) 2008   fell off truck hit  head on concrete   Past Surgical History:  Procedure Laterality Date   APPLICATION OF CRANIAL NAVIGATION Left 04/29/2020   Procedure: APPLICATION OF CRANIAL NAVIGATION;  Surgeon: Cannon Champion, MD;  Location: [facility] OR;  Service: Neurosurgery;  Laterality: Left;   arthroscopic knee     COLONOSCOPY N/A 11/29/2018   Procedure: COLONOSCOPY;  Surgeon: Ruby Corporal, MD;  Location: [facility];  Service: Endoscopy;  Laterality: N/A;  730-rescheduled 9/2 same time per Ann   CRANIOTOMY Left 04/29/2020   Procedure: LEFT CRANIECTOMY WITH TUMOR EXCISION;  Surgeon: Cannon Champion, MD;  Location: [facility] OR;  Service: Neurosurgery;  Laterality: Left;   KNEE SURGERY Right    NASAL SEPTOPLASTY W/ TURBINOPLASTY Bilateral 03/19/2020   Procedure: NASAL SEPTOPLASTY WITH BILATERAL  TURBINATE REDUCTION;  Surgeon: Reynold Caves, MD;  Location: [facility] OR;  Service: ENT;  Laterality: Bilateral;   VASECTOMY     Patient Active Problem List   Diagnosis Date Noted   Current use of long term anticoagulation 05/06/2022   Seizure (HCC) 08/13/2020   Seizures (HCC) 08/12/2020   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   DVT (deep venous thrombosis) (HCC) 08/12/2020  COVID-19 virus infection 08/12/2020   Acute lower UTI 07/14/2020   Delirium 07/06/2020   Sleep apnea 07/06/2020   GERD (gastroesophageal reflux disease) 07/06/2020   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HCC) 06/25/2020   Sundowning 06/25/2020   Slow transit constipation    Essential hypertension    Hypokalemia    Postoperative pain    Meningioma (HCC) 05/02/2020   Brain tumor (HCC) 04/28/2020   S/P nasal septoplasty 03/19/2020   Special screening for malignant neoplasms, colon 01/09/2018    ONSET DATE: 04/03/2023  REFERRING DIAG: G82.50 (ICD-10-CM) - 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HCC) D32.9 (ICD-10-CM) - Meningioma (HCC)  THERAPY DIAG:  Muscle weakness (generalized)  Other abnormalities of gait and mobility  Unsteadiness on feet  Abnormal posture  Other symptoms and signs  involving the musculoskeletal system  Other symptoms and signs involving the nervous system  Rationale for Evaluation and Treatment: Rehabilitation  SUBJECTIVE:                                                                                                                                                                                             SUBJECTIVE STATEMENT: Pt and [name] deny any acute changes since last visit. Pt increased his Sinemet  to 3 pills/day on Sunday, have not noticed much change yet. Pt with ongoing pain in his buttocks, small area of purplish red on R side per [name] but otherwise redness is improved and skin is looking better.  Pt had no soreness after last visit which he was surprised by. [name] reports that Dorla Gartner is standing up better at home, able to stand up taller, he is less shaky.   Pt accompanied by: self and significant other wife [name]  PERTINENT HISTORY: anxiety, OA, cancer, subdural hematoma   PAIN:  Are you having pain? No  PRECAUTIONS: Fall  RED FLAGS: None   WEIGHT BEARING RESTRICTIONS: No  FALLS: Has patient fallen in last 6 months? No  LIVING ENVIRONMENT: Lives with: lives with their spouse Lives in: House/apartment Home is power wheelchair accessible Has following equipment at home: Wheelchair (power), Ramped entry, and slide board, [name], handicap accessible bathroom, hospital bed  PLOF: Independent with household mobility with device, Needs assistance with ADLs, and Needs assistance with transfers  PATIENT GOALS: "stand pivot transfer" "take steps in // bars"  OBJECTIVE:  Note: Objective measures were completed at Evaluation unless otherwise noted.  DIAGNOSTIC FINDINGS: None relevant to this POC  COGNITION: Overall cognitive status: Impaired   SENSATION: No N/T per patient report  EDEMA:  Yes in BLE, wears compression stockings  POSTURE: rounded shoulders, forward head, and posterior pelvic tilt  LOWER EXTREMITY  ROM:  Passive  Right Eval Left Eval  Hip flexion Tight hip flexors Tight hip flexors  Hip extension    Hip abduction    Hip adduction    Hip internal rotation    Hip external rotation    Knee flexion    Knee extension Tight HS Tight HS  Ankle dorsiflexion Tight gastroc Tight gastroc  Ankle plantarflexion    Ankle inversion    Ankle eversion     (Blank rows = not tested)  LOWER EXTREMITY MMT:    MMT Right Eval Left Eval  Hip flexion 2- 3  Hip extension    Hip abduction    Hip adduction    Hip internal rotation    Hip external rotation    Knee flexion 2- 3  Knee extension 2- 3  Ankle dorsiflexion 0 1  Ankle plantarflexion    Ankle inversion    Ankle eversion    (Blank rows = not tested)  BED MOBILITY:  Sit to supine Mod A Supine to sit Min A Min A for trunk for supine to sit Mod A for BLE for sit to supine  TRANSFERS: Assistive device utilized:  stedy   Sit to stand: SBA Stand to sit: SBA Chair to chair:  dependent with use of stedy                                                                                                                               TREATMENT:   NMR Pt noted to have increased tremor in BUE today with intentional movements (R>L), making it difficult to grip onto RW for transfers.  Attempted initial sit to stand to RW, pt requires max A to stand and loses his balance backwards into his PWC. Upon second attempt to stand pt requires mod A but is able to stand all the way up to his RW.  Stand pivot transfer from [facility] to mat table with RW to the R side, max A to dependent to advance his RLE during transfer. Pt requires assist from a 2nd person for cueing for sequencing of transfer and for RW management. Stand pivot transfer back to L side back to PWC from mat table with increased assist needed for RLE management due to difficulty moving limb backwards.  Assessed vitals as pt appears less talkative and more fatigued this date and requires  increased physical assist. Seated BP: 147/69, HR 77, vitals WNL.  Pt agreeable to work on short distance gait with RW. Sit to stand with mod A to RW. Ambulation x 9 ft, cues for sequencing of gait and max A to advance RLE. After seated rest break pt able to ambulate x 10 ft with RW with only min to mod A needed to advance RLE. [name] provides close w/c follow during gait training. Pt left seated in w/c with [name] at end of session.    PATIENT EDUCATION: Education details: Continue to work on standing to the RW at  home as safe and able Person educated: Patient and Spouse Education method: Medical illustrator Education comprehension: verbalized understanding  HOME EXERCISE PROGRAM: To be initiated as appropriate  GOALS: Goals reviewed with patient? Yes  SHORT TERM GOALS: Target date: 05/05/2023  Pt will tolerate standing x 5 min with LRAD and CGA Baseline: x 2 min in stedy with CGA; x2 minutes w/RW and CGA (4/17) Goal status: IN PROGRESS    LONG TERM GOALS: Target date: 05/26/2023   Pt will tolerate standing x 10 min with LRAD and CGA Baseline: x 2 min in stedy with CGA  Goal status: INITIAL  2.  Pt will perform stand pivot transfer with LRAD and mod A Baseline: transfers with stedy Goal status: INITIAL  3.  Pt will ambulate x 5 ft in // bars with assist x 2 for safety Baseline: able to take one step in // bars per family report Goal status: INITIAL   ASSESSMENT:  CLINICAL IMPRESSION: Emphasis of skilled PT session on continued gait training w/RW for improved functional strength, spasticity management and independence as well as continuing to work on stand pivot transfers with RW. Pt continues to struggle to advance his RLE with stand pivot transfers and initially with gait this session. Pt does exhibit improved RLE advancement during second bout of ambulation. Pt appears more fatigued this date and does not perform as well as in previous sessions. Pt's vitals were WNL  and he was well-rested per his report. Pt did have some mild GI upset over the weekend and may still be recovering from that. Pt continues to benefit from skilled PT services to work towards increased safety and independence with functional mobility in order to decrease caregiver burden. Continue POC.    OBJECTIVE IMPAIRMENTS: decreased activity tolerance, decreased balance, decreased cognition, decreased endurance, decreased knowledge of condition, decreased knowledge of use of DME, decreased mobility, difficulty walking, decreased ROM, decreased strength, increased edema, and impaired UE functional use.   ACTIVITY LIMITATIONS: carrying, lifting, bending, standing, transfers, and bed mobility  PARTICIPATION LIMITATIONS: meal prep, cleaning, laundry, and community activity  PERSONAL FACTORS: Fitness, Time since onset of injury/illness/exacerbation, and 1-2 comorbidities:    anxiety, OA, cancer, subdural hematoma are also affecting patient's functional outcome.   REHAB POTENTIAL: Good  CLINICAL DECISION MAKING: Stable/uncomplicated  EVALUATION COMPLEXITY: High  PLAN:  PT FREQUENCY: 2x/week  PT DURATION: 6 weeks  PLANNED INTERVENTIONS: 97164- PT Re-evaluation, 97110-Therapeutic exercises, 97530- Therapeutic activity, 97112- Neuromuscular re-education, 97535- Self Care, 16109- Manual therapy, [phone]- Gait training, [phone]- Orthotic Fit/training, [phone]- Electrical stimulation (manual), Patient/Family education, Balance training, Taping, Dry Needling, Joint mobilization, Cognitive remediation, DME instructions, Wheelchair mobility training, Cryotherapy, and Moist heat  PLAN FOR NEXT SESSION: Work on Oncologist, retro gait, continues gait training w/RW (w/shoe cover and leg loop on RLE).    Lorita Rosa, PT Lorita Rosa, PT, DPT, CSRS   05/17/2023, 2:01 PM

## 2023-05-19 ENCOUNTER — Ambulatory Visit: Attending: Physical Medicine & Rehabilitation | Admitting: Physical Therapy

## 2023-05-19 VITALS — BP 158/69 | HR 68

## 2023-05-19 DIAGNOSIS — R29818 Other symptoms and signs involving the nervous system: Secondary | ICD-10-CM | POA: Insufficient documentation

## 2023-05-19 DIAGNOSIS — R251 Tremor, unspecified: Secondary | ICD-10-CM | POA: Insufficient documentation

## 2023-05-19 DIAGNOSIS — R278 Other lack of coordination: Secondary | ICD-10-CM | POA: Insufficient documentation

## 2023-05-19 DIAGNOSIS — R293 Abnormal posture: Secondary | ICD-10-CM | POA: Diagnosis present

## 2023-05-19 DIAGNOSIS — R2681 Unsteadiness on feet: Secondary | ICD-10-CM

## 2023-05-19 DIAGNOSIS — R29898 Other symptoms and signs involving the musculoskeletal system: Secondary | ICD-10-CM | POA: Diagnosis present

## 2023-05-19 DIAGNOSIS — M6281 Muscle weakness (generalized): Secondary | ICD-10-CM | POA: Diagnosis present

## 2023-05-19 DIAGNOSIS — R2689 Other abnormalities of gait and mobility: Secondary | ICD-10-CM | POA: Diagnosis present

## 2023-05-19 NOTE — Therapy (Signed)
 OUTPATIENT PHYSICAL THERAPY NEURO TREATMENT   Patient Name: Anthony Downs MRN: [id number] DOB:03-14-1950, 73 y.o., male Today's Date: 05/19/2023   PCP: Kathyleen Parkins, MD REFERRING PROVIDER: Rawland Caddy, MD  END OF SESSION:  PT End of Session - 05/19/23 1316     Visit Number 9    Number of Visits 13   with eval   Date for PT Re-Evaluation 06/07/23    Authorization Type Medicare    PT Start Time 1315    PT Stop Time 1400    PT Time Calculation (min) 45 min    Equipment Utilized During Treatment Gait belt;Other (comment)   shoe cover and leg loop   Activity Tolerance Patient tolerated treatment well    Behavior During Therapy [facility] for tasks assessed/performed                    Past Medical History:  Diagnosis Date   Acid reflux    Anxiety    Arthritis    Asthma    as a child   Basal cell carcinoma 01/26/2021   RIGHT POST SURICULAR INFERIOR   Basal cell carcinoma 01/26/2021   RIGHT POST AURICULAR SUPERIOR   Benign brain tumor (HCC)    Chest pain    a. 09/2019 MV: No ischemia.   Complication of anesthesia    slow to wake up and pain medicines make him sick   GERD (gastroesophageal reflux disease)    History of DVT (deep vein thrombosis)    History of echocardiogram    a. 09/2019 Echo: EF 60-65%, nl RV fxn; b. 09/2022 Echo: EF of 60-65% without regional wall motion abnormalities, normal RV size/function, and borderline dilatation of the aortic root at 39 mm.   Hypercholesteremia    Hypertension    PONV (postoperative nausea and vomiting)    PVC's (premature ventricular contractions)    a. 08/2022 Zio: predominantly sinus rhythm with an average heart rate of 71 (50-150), 5 brief SVT runs (longest 10.2 seconds with max rate of 150).  3.2% PVC burden was also noted.  No triggered events.   Skin cancer    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, squamous behind ear   Sleep apnea    no Cpap   Subdural hematoma, post-traumatic (HCC) 2008   fell off truck hit  head on concrete   Past Surgical History:  Procedure Laterality Date   APPLICATION OF CRANIAL NAVIGATION Left 04/29/2020   Procedure: APPLICATION OF CRANIAL NAVIGATION;  Surgeon: Cannon Champion, MD;  Location: [facility] OR;  Service: Neurosurgery;  Laterality: Left;   arthroscopic knee     COLONOSCOPY N/A 11/29/2018   Procedure: COLONOSCOPY;  Surgeon: Ruby Corporal, MD;  Location: [facility];  Service: Endoscopy;  Laterality: N/A;  730-rescheduled 9/2 same time per Ann   CRANIOTOMY Left 04/29/2020   Procedure: LEFT CRANIECTOMY WITH TUMOR EXCISION;  Surgeon: Cannon Champion, MD;  Location: [facility] OR;  Service: Neurosurgery;  Laterality: Left;   KNEE SURGERY Right    NASAL SEPTOPLASTY W/ TURBINOPLASTY Bilateral 03/19/2020   Procedure: NASAL SEPTOPLASTY WITH BILATERAL  TURBINATE REDUCTION;  Surgeon: Reynold Caves, MD;  Location: [facility] OR;  Service: ENT;  Laterality: Bilateral;   VASECTOMY     Patient Active Problem List   Diagnosis Date Noted   Current use of long term anticoagulation 05/06/2022   Seizure (HCC) 08/13/2020   Seizures (HCC) 08/12/2020   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   DVT (deep venous thrombosis) (HCC) 08/12/2020  COVID-19 virus infection 08/12/2020   Acute lower UTI 07/14/2020   Delirium 07/06/2020   Sleep apnea 07/06/2020   GERD (gastroesophageal reflux disease) 07/06/2020   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HCC) 06/25/2020   Sundowning 06/25/2020   Slow transit constipation    Essential hypertension    Hypokalemia    Postoperative pain    Meningioma (HCC) 05/02/2020   Brain tumor (HCC) 04/28/2020   S/P nasal septoplasty 03/19/2020   Special screening for malignant neoplasms, colon 01/09/2018    ONSET DATE: 04/03/2023  REFERRING DIAG: G82.50 (ICD-10-CM) - 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HCC) D32.9 (ICD-10-CM) - Meningioma (HCC)  THERAPY DIAG:  Muscle weakness (generalized)  Other abnormalities of gait and mobility  Unsteadiness on feet  Abnormal posture  Rationale for Evaluation and  Treatment: Rehabilitation  SUBJECTIVE:                                                                                                                                                                                             SUBJECTIVE STATEMENT: [name] reports pt is no longer taking Sinemet , was not helping his tremors. Pt is feeling better today compared to last session. No falls. Ate well prior to session.   Pt accompanied by: self and significant other wife [name]  PERTINENT HISTORY: anxiety, OA, cancer, subdural hematoma   PAIN:  Are you having pain? No  PRECAUTIONS: Fall  RED FLAGS: None   WEIGHT BEARING RESTRICTIONS: No  FALLS: Has patient fallen in last 6 months? No  LIVING ENVIRONMENT: Lives with: lives with their spouse Lives in: House/apartment Home is power wheelchair accessible Has following equipment at home: Wheelchair (power), Ramped entry, and slide board, [name], handicap accessible bathroom, hospital bed  PLOF: Independent with household mobility with device, Needs assistance with ADLs, and Needs assistance with transfers  PATIENT GOALS: "stand pivot transfer" "take steps in // bars"  OBJECTIVE:  Note: Objective measures were completed at Evaluation unless otherwise noted.  DIAGNOSTIC FINDINGS: None relevant to this POC  COGNITION: Overall cognitive status: Impaired   SENSATION: No N/T per patient report  EDEMA:  Yes in BLE, wears compression stockings  POSTURE: rounded shoulders, forward head, and posterior pelvic tilt  LOWER EXTREMITY ROM:     Passive  Right Eval Left Eval  Hip flexion Tight hip flexors Tight hip flexors  Hip extension    Hip abduction    Hip adduction    Hip internal rotation    Hip external rotation    Knee flexion    Knee extension Tight HS Tight HS  Ankle dorsiflexion Tight gastroc Tight gastroc  Ankle plantarflexion    Ankle inversion    Ankle  eversion     (Blank rows = not tested)  LOWER EXTREMITY MMT:     MMT Right Eval Left Eval  Hip flexion 2- 3  Hip extension    Hip abduction    Hip adduction    Hip internal rotation    Hip external rotation    Knee flexion 2- 3  Knee extension 2- 3  Ankle dorsiflexion 0 1  Ankle plantarflexion    Ankle inversion    Ankle eversion    (Blank rows = not tested)  BED MOBILITY:  Sit to supine Mod A Supine to sit Min A Min A for trunk for supine to sit Mod A for BLE for sit to supine  TRANSFERS: Assistive device utilized:  stedy   Sit to stand: SBA Stand to sit: SBA Chair to chair:  dependent with use of stedy  VITALS  Vitals:   05/19/23 1335  BP: (!) 158/69  Pulse: 68                                                                                                                                TREATMENT:   NMR Pt w/decreased tremor in BUEs this date  Pt performed sit to stands throughout session w/min A x1 for improved anterior weight shift and placement of R foot Stand pivots  Pt performed stand pivot from power WC to 22" mat table on L side w/min A for RLE movement. Max verbal cues for proper sequencing of movement.  After seated rest break, pt performed sit > stand pivot from 22" mat to power chair on R side w/mod A x2 for RLE progression and steadying assist as pt became choked up when performing transfer. Max verbal cues required for sequencing and to prevent premature sitting. Once pt made it to chair, pt reported feeling hot and sweaty so reclined pt in chair assessed vitals (see above) and systolic elevated but within limits. Also removed gait belt as pt reported it was constrictive. After several minutes, pt reported feeling okay and stopped coughing.   Gait pattern: step to pattern, step through pattern, decreased step length- Right, decreased stride length, decreased hip/knee flexion- Right, decreased ankle dorsiflexion- Right, scissoring, lateral hip instability, narrow BOS, and poor foot clearance- Right Distance walked:  20' and 32'  Assistive device utilized: Environmental consultant - 2 wheeled and foot cover and leg loop  Level of assistance:  min-mod A required to progress RLE w/leg loop and CGA at pelvis for safety w/close WC follow provided by [name] Comments: Pt w/improved communication this date, informing therapists when he requires a sitting rest break prior to sitting down. Pt able to progress RLE independently from pre-swing to mid swing but requires min-light mod A for terminal swing to IC. Min verbal cues for proper L foot placement, especially w/turns, as pt tends to scissor. On second gait trial, pt w/increased gait speed and AD management w/turn to L side.  PATIENT EDUCATION: Education details: Continue to work on standing to the RW at home as safe and able Person educated: Patient and Spouse Education method: Medical illustrator Education comprehension: verbalized understanding  HOME EXERCISE PROGRAM: To be initiated as appropriate  GOALS: Goals reviewed with patient? Yes  SHORT TERM GOALS: Target date: 05/05/2023  Pt will tolerate standing x 5 min with LRAD and CGA Baseline: x 2 min in stedy with CGA; x2 minutes w/RW and CGA (4/17) Goal status: IN PROGRESS    LONG TERM GOALS: Target date: 05/26/2023   Pt will tolerate standing x 10 min with LRAD and CGA Baseline: x 2 min in stedy with CGA  Goal status: INITIAL  2.  Pt will perform stand pivot transfer with LRAD and mod A Baseline: transfers with stedy Goal status: INITIAL  3.  Pt will ambulate x 5 ft in // bars with assist x 2 for safety Baseline: able to take one step in // bars per family report Goal status: INITIAL   ASSESSMENT:  CLINICAL IMPRESSION: Emphasis of skilled PT session on continued gait training w/RW for improved functional strength, spasticity management and independence as well as continuing to work on stand pivot transfers with RW.  Pt w/improved energy and was more himself today, reports he stopped taking the  Sinemet  but unsure if this is related. Pt limited by coughing fit today during stand pivot and reported tightness in diaphragm and difficulty catching breath. Vitals WNL but pt required several minutes to recover. Pt ambulated his longest distance of 37' today and is able to independently move his R foot from pre to mid swing.  Continue POC.    OBJECTIVE IMPAIRMENTS: decreased activity tolerance, decreased balance, decreased cognition, decreased endurance, decreased knowledge of condition, decreased knowledge of use of DME, decreased mobility, difficulty walking, decreased ROM, decreased strength, increased edema, and impaired UE functional use.   ACTIVITY LIMITATIONS: carrying, lifting, bending, standing, transfers, and bed mobility  PARTICIPATION LIMITATIONS: meal prep, cleaning, laundry, and community activity  PERSONAL FACTORS: Fitness, Time since onset of injury/illness/exacerbation, and 1-2 comorbidities:    anxiety, OA, cancer, subdural hematoma are also affecting patient's functional outcome.   REHAB POTENTIAL: Good  CLINICAL DECISION MAKING: Stable/uncomplicated  EVALUATION COMPLEXITY: High  PLAN:  PT FREQUENCY: 2x/week  PT DURATION: 6 weeks  PLANNED INTERVENTIONS: 97164- PT Re-evaluation, 97110-Therapeutic exercises, 97530- Therapeutic activity, 97112- Neuromuscular re-education, 97535- Self Care, 16109- Manual therapy, [phone]- Gait training, [phone]- Orthotic Fit/training, [phone]- Electrical stimulation (manual), Patient/Family education, Balance training, Taping, Dry Needling, Joint mobilization, Cognitive remediation, DME instructions, Wheelchair mobility training, Cryotherapy, and Moist heat  PLAN FOR NEXT SESSION: 10th visit progress note. Work on Oncologist, retro gait, continues gait training w/RW (w/shoe cover and leg loop on RLE).    Goebel Hellums E Christipher Rieger, PT, DPT 05/19/2023, 2:02 PM

## 2023-05-24 ENCOUNTER — Ambulatory Visit: Admitting: Physical Therapy

## 2023-05-24 DIAGNOSIS — R29898 Other symptoms and signs involving the musculoskeletal system: Secondary | ICD-10-CM

## 2023-05-24 DIAGNOSIS — R29818 Other symptoms and signs involving the nervous system: Secondary | ICD-10-CM

## 2023-05-24 DIAGNOSIS — M6281 Muscle weakness (generalized): Secondary | ICD-10-CM | POA: Diagnosis not present

## 2023-05-24 DIAGNOSIS — R2681 Unsteadiness on feet: Secondary | ICD-10-CM

## 2023-05-24 DIAGNOSIS — R2689 Other abnormalities of gait and mobility: Secondary | ICD-10-CM

## 2023-05-24 DIAGNOSIS — R293 Abnormal posture: Secondary | ICD-10-CM

## 2023-05-24 DIAGNOSIS — R251 Tremor, unspecified: Secondary | ICD-10-CM

## 2023-05-24 NOTE — Therapy (Signed)
 OUTPATIENT PHYSICAL THERAPY NEURO TREATMENT - 10th VISIT PROGRESS NOTE   Patient Name: Anthony Downs MRN: [id number] DOB:06/29/1950, 73 y.o., male Today's Date: 05/24/2023   PCP: Kathyleen Parkins, MD REFERRING PROVIDER: Rawland Caddy, MD  Physical Therapy Progress Note   Dates of Reporting Period: 04/12/2023 - 05/24/2023  See Note below for Objective Data and Assessment of Progress/Goals.  Thank you for the referral of this patient. Lorita Rosa, PT, DPT, CSRS   END OF SESSION:  PT End of Session - 05/24/23 1401     Visit Number 10    Number of Visits 13   with eval   Date for PT Re-Evaluation 06/07/23    Authorization Type Medicare    PT Start Time 1400    PT Stop Time 1443    PT Time Calculation (min) 43 min    Equipment Utilized During Treatment Gait belt;Other (comment)   shoe cover and leg loop   Activity Tolerance Patient tolerated treatment well    Behavior During Therapy [facility] for tasks assessed/performed                     Past Medical History:  Diagnosis Date   Acid reflux    Anxiety    Arthritis    Asthma    as a child   Basal cell carcinoma 01/26/2021   RIGHT POST SURICULAR INFERIOR   Basal cell carcinoma 01/26/2021   RIGHT POST AURICULAR SUPERIOR   Benign brain tumor (HCC)    Chest pain    a. 09/2019 MV: No ischemia.   Complication of anesthesia    slow to wake up and pain medicines make him sick   GERD (gastroesophageal reflux disease)    History of DVT (deep vein thrombosis)    History of echocardiogram    a. 09/2019 Echo: EF 60-65%, nl RV fxn; b. 09/2022 Echo: EF of 60-65% without regional wall motion abnormalities, normal RV size/function, and borderline dilatation of the aortic root at 39 mm.   Hypercholesteremia    Hypertension    PONV (postoperative nausea and vomiting)    PVC's (premature ventricular contractions)    a. 08/2022 Zio: predominantly sinus rhythm with an average heart rate of 71 (50-150), 5 brief SVT runs  (longest 10.2 seconds with max rate of 150).  3.2% PVC burden was also noted.  No triggered events.   Skin cancer    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, squamous behind ear   Sleep apnea    no Cpap   Subdural hematoma, post-traumatic (HCC) 2008   fell off truck hit head on concrete   Past Surgical History:  Procedure Laterality Date   APPLICATION OF CRANIAL NAVIGATION Left 04/29/2020   Procedure: APPLICATION OF CRANIAL NAVIGATION;  Surgeon: Cannon Champion, MD;  Location: [facility] OR;  Service: Neurosurgery;  Laterality: Left;   arthroscopic knee     COLONOSCOPY N/A 11/29/2018   Procedure: COLONOSCOPY;  Surgeon: Ruby Corporal, MD;  Location: [facility];  Service: Endoscopy;  Laterality: N/A;  730-rescheduled 9/2 same time per Ann   CRANIOTOMY Left 04/29/2020   Procedure: LEFT CRANIECTOMY WITH TUMOR EXCISION;  Surgeon: Cannon Champion, MD;  Location: [facility] OR;  Service: Neurosurgery;  Laterality: Left;   KNEE SURGERY Right    NASAL SEPTOPLASTY W/ TURBINOPLASTY Bilateral 03/19/2020   Procedure: NASAL SEPTOPLASTY WITH BILATERAL  TURBINATE REDUCTION;  Surgeon: Reynold Caves, MD;  Location: [facility] OR;  Service: ENT;  Laterality: Bilateral;   VASECTOMY  Patient Active Problem List   Diagnosis Date Noted   Current use of long term anticoagulation 05/06/2022   Seizure (HCC) 08/13/2020   Seizures (HCC) 08/12/2020   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   DVT (deep venous thrombosis) (HCC) 08/12/2020   COVID-19 virus infection 08/12/2020   Acute lower UTI 07/14/2020   Delirium 07/06/2020   Sleep apnea 07/06/2020   GERD (gastroesophageal reflux disease) 07/06/2020   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HCC) 06/25/2020   Sundowning 06/25/2020   Slow transit constipation    Essential hypertension    Hypokalemia    Postoperative pain    Meningioma (HCC) 05/02/2020   Brain tumor (HCC) 04/28/2020   S/P nasal septoplasty 03/19/2020   Special screening for malignant neoplasms, colon 01/09/2018    ONSET DATE:  04/03/2023  REFERRING DIAG: G82.50 (ICD-10-CM) - 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HCC) D32.9 (ICD-10-CM) - Meningioma (HCC)  THERAPY DIAG:  Muscle weakness (generalized)  Other abnormalities of gait and mobility  Unsteadiness on feet  Abnormal posture  Other symptoms and signs involving the musculoskeletal system  Other symptoms and signs involving the nervous system  Tremor  Rationale for Evaluation and Treatment: Rehabilitation  SUBJECTIVE:                                                                                                                                                                                             SUBJECTIVE STATEMENT: Pt and wife deny any acute changes since last visit. Pt is going to start propanolol 3x/day for BP management.  Pt accompanied by: self and significant other wife Anthony Downs  PERTINENT HISTORY: anxiety, OA, cancer, subdural hematoma   PAIN:  Are you having pain? No  PRECAUTIONS: Fall  RED FLAGS: None   WEIGHT BEARING RESTRICTIONS: No  FALLS: Has patient fallen in last 6 months? No  LIVING ENVIRONMENT: Lives with: lives with their spouse Lives in: House/apartment Home is power wheelchair accessible Has following equipment at home: Wheelchair (power), Ramped entry, and slide board, [name], handicap accessible bathroom, hospital bed  PLOF: Independent with household mobility with device, Needs assistance with ADLs, and Needs assistance with transfers  PATIENT GOALS: "stand pivot transfer" "take steps in // bars"  OBJECTIVE:  Note: Objective measures were completed at Evaluation unless otherwise noted.  DIAGNOSTIC FINDINGS: None relevant to this POC  COGNITION: Overall cognitive status: Impaired   SENSATION: No N/T per patient report  EDEMA:  Yes in BLE, wears compression stockings  POSTURE: rounded shoulders, forward head, and posterior pelvic tilt  LOWER EXTREMITY ROM:     Passive  Right Eval Left Eval  Hip flexion Tight  hip flexors Tight hip flexors  Hip extension  Hip abduction    Hip adduction    Hip internal rotation    Hip external rotation    Knee flexion    Knee extension Tight HS Tight HS  Ankle dorsiflexion Tight gastroc Tight gastroc  Ankle plantarflexion    Ankle inversion    Ankle eversion     (Blank rows = not tested)  LOWER EXTREMITY MMT:    MMT Right Eval Left Eval  Hip flexion 2- 3  Hip extension    Hip abduction    Hip adduction    Hip internal rotation    Hip external rotation    Knee flexion 2- 3  Knee extension 2- 3  Ankle dorsiflexion 0 1  Ankle plantarflexion    Ankle inversion    Ankle eversion    (Blank rows = not tested)  BED MOBILITY:  Sit to supine Mod A Supine to sit Min A Min A for trunk for supine to sit Mod A for BLE for sit to supine  TRANSFERS: Assistive device utilized:  stedy   Sit to stand: SBA Stand to sit: SBA Chair to chair:  dependent with use of stedy  VITALS  There were no vitals filed for this visit.                                                                                                                               TREATMENT:   NMR Stand pivot transfer to the L PWC to mat table with bari RW and assist x 2 Max A for RLE management Min A for standing balance Forwards/retro gait from EOM with bari RW x 3 reps with 3 steps forward/backward Min A to advance RLE stepping forwards No assist needed to take steps backwards! Pt does need up to max A to bring RW backwards Pt does need up to min A to stand from mat table and PWC this date, cues for anterior lean and UE/LE placement Gait x 13 ft with RW with min A to advance RLE and CGA x 1 for safety, Anthony Downs able to follow in PWC   Gait pattern: step to pattern, step through pattern, decreased step length- Right, decreased stride length, decreased hip/knee flexion- Right, decreased ankle dorsiflexion- Right, scissoring, lateral hip instability, narrow BOS, and poor foot clearance-  Right Distance walked: 13' Assistive device utilized: Environmental consultant - 2 wheeled and foot cover and leg loop  Level of assistance:  min-mod A required to progress RLE w/leg loop and CGA at pelvis for safety w/close WC follow provided by Anthony Downs Comments: Pt continues to do well progressing his RLE even with onset of fatigue. Pt does continue to scissor with his LLE and needs cues to widen BOS.   PATIENT EDUCATION: Education details: Continue to work on standing to the RW at home as safe and able Person educated: Patient and Spouse Education method: Medical illustrator Education comprehension: verbalized understanding  HOME EXERCISE PROGRAM: To be initiated as  appropriate  GOALS: Goals reviewed with patient? Yes  SHORT TERM GOALS: Target date: 05/05/2023  Pt will tolerate standing x 5 min with LRAD and CGA Baseline: x 2 min in stedy with CGA; x2 minutes w/RW and CGA (4/17) Goal status: IN PROGRESS    LONG TERM GOALS: Target date: 05/26/2023   Pt will tolerate standing x 10 min with LRAD and CGA Baseline: x 2 min in stedy with CGA  Goal status: INITIAL  2.  Pt will perform stand pivot transfer with LRAD and mod A Baseline: transfers with stedy Goal status: INITIAL  3.  Pt will ambulate x 5 ft in // bars with assist x 2 for safety Baseline: able to take one step in // bars per family report Goal status: INITIAL   ASSESSMENT:  CLINICAL IMPRESSION: Emphasis of skilled PT session on continued gait training w/RW for improved functional strength, spasticity management and independence as well as continuing to work on stand pivot transfers with RW and retro gait with RW. Pt exhibits improved control and management of his RLE this date with decreased assist needed with short distance gait and even the ability to take steps backwards with his RLE without assist for limb management! Pt continues to benefit from skilled PT services to work towards increased safety and independence with  functional mobility and transfers. Continue POC.    OBJECTIVE IMPAIRMENTS: decreased activity tolerance, decreased balance, decreased cognition, decreased endurance, decreased knowledge of condition, decreased knowledge of use of DME, decreased mobility, difficulty walking, decreased ROM, decreased strength, increased edema, and impaired UE functional use.   ACTIVITY LIMITATIONS: carrying, lifting, bending, standing, transfers, and bed mobility  PARTICIPATION LIMITATIONS: meal prep, cleaning, laundry, and community activity  PERSONAL FACTORS: Fitness, Time since onset of injury/illness/exacerbation, and 1-2 comorbidities:    anxiety, OA, cancer, subdural hematoma are also affecting patient's functional outcome.   REHAB POTENTIAL: Good  CLINICAL DECISION MAKING: Stable/uncomplicated  EVALUATION COMPLEXITY: High  PLAN:  PT FREQUENCY: 2x/week  PT DURATION: 6 weeks  PLANNED INTERVENTIONS: 97164- PT Re-evaluation, 97110-Therapeutic exercises, 97530- Therapeutic activity, 97112- Neuromuscular re-education, 97535- Self Care, 16109- Manual therapy, [phone]- Gait training, [phone]- Orthotic Fit/training, [phone]- Electrical stimulation (manual), Patient/Family education, Balance training, Taping, Dry Needling, Joint mobilization, Cognitive remediation, DME instructions, Wheelchair mobility training, Cryotherapy, and Moist heat  PLAN FOR NEXT SESSION: Work on Oncologist, retro gait, continues gait training w/RW (w/shoe cover and leg loop on RLE). Sit to stands.   Furman Trentman, PT Lorita Rosa, PT, DPT, CSRS  05/24/2023, 2:43 PM

## 2023-05-26 ENCOUNTER — Ambulatory Visit: Admitting: Physical Therapy

## 2023-05-26 VITALS — BP 117/76 | HR 66

## 2023-05-26 DIAGNOSIS — M6281 Muscle weakness (generalized): Secondary | ICD-10-CM | POA: Diagnosis not present

## 2023-05-26 DIAGNOSIS — R2689 Other abnormalities of gait and mobility: Secondary | ICD-10-CM

## 2023-05-26 DIAGNOSIS — R278 Other lack of coordination: Secondary | ICD-10-CM

## 2023-05-26 DIAGNOSIS — R2681 Unsteadiness on feet: Secondary | ICD-10-CM
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
## 2023-06-02 ENCOUNTER — Ambulatory Visit: Admitting: Physical Therapy

## 2023-06-02 DIAGNOSIS — M6281 Muscle weakness (generalized): Secondary | ICD-10-CM | POA: Diagnosis not present

## 2023-06-02 DIAGNOSIS — R2681 Unsteadiness on feet: Secondary | ICD-10-CM

## 2023-06-02 DIAGNOSIS — R293 Abnormal posture: Secondary | ICD-10-CM

## 2023-06-02 DIAGNOSIS — R2689 Other abnormalities of gait and mobility: Secondary | ICD-10-CM
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
## 2023-06-03 ENCOUNTER — Ambulatory Visit (HOSPITAL_COMMUNITY)
Admission: RE | Admit: 2023-06-03 | Discharge: 2023-06-03 | Disposition: A | Discharge status: Home / Self Care | Source: Ambulatory Visit | Attending: Internal Medicine | Admitting: Internal Medicine

## 2023-06-03 ENCOUNTER — Other Ambulatory Visit (HOSPITAL_COMMUNITY): Payer: Self-pay | Admitting: Internal Medicine

## 2023-06-03 DIAGNOSIS — R059 Cough, unspecified: Secondary | ICD-10-CM | POA: Diagnosis present

## 2023-06-07 ENCOUNTER — Ambulatory Visit: Payer: Self-pay | Admitting: Physical Therapy

## 2023-06-07 DIAGNOSIS — R2689 Other abnormalities of gait and mobility: Secondary | ICD-10-CM

## 2023-06-07 DIAGNOSIS — M6281 Muscle weakness (generalized): Secondary | ICD-10-CM

## 2023-06-07 DIAGNOSIS — R293 Abnormal posture: Secondary | ICD-10-CM

## 2023-06-07 NOTE — Therapy (Signed)
 OUTPATIENT PHYSICAL THERAPY NEURO TREATMENT   Patient Name: Anthony Downs MRN: [id number] DOB:March 16, 1950, 73 y.o., male Today's Date: 06/07/2023   PCP: Kathyleen Parkins, MD REFERRING PROVIDER: Rawland Caddy, MD    END OF SESSION:  PT End of Session - 06/07/23 1103     Visit Number 13    Number of Visits 29   with eval   Date for PT Re-Evaluation 07/28/23   recert   Authorization Type Medicare    PT Start Time 1100    PT Stop Time 1145    PT Time Calculation (min) 45 min    Equipment Utilized During Treatment Gait belt    Activity Tolerance Patient tolerated treatment well    Behavior During Therapy WFL for tasks assessed/performed                      Past Medical History:  Diagnosis Date   Acid reflux    Anxiety    Arthritis    Asthma    as a child   Basal cell carcinoma 01/26/2021   RIGHT POST SURICULAR INFERIOR   Basal cell carcinoma 01/26/2021   RIGHT POST AURICULAR SUPERIOR   Benign brain tumor (HCC)    Chest pain    a. 09/2019 MV: No ischemia.   Complication of anesthesia    slow to wake up and pain medicines make him sick   GERD (gastroesophageal reflux disease)    History of DVT (deep vein thrombosis)    History of echocardiogram    a. 09/2019 Echo: EF 60-65%, nl RV fxn; b. 09/2022 Echo: EF of 60-65% without regional wall motion abnormalities, normal RV size/function, and borderline dilatation of the aortic root at 39 mm.   Hypercholesteremia    Hypertension    PONV (postoperative nausea and vomiting)    PVC's (premature ventricular contractions)    a. 08/2022 Zio: predominantly sinus rhythm with an average heart rate of 71 (50-150), 5 brief SVT runs (longest 10.2 seconds with max rate of 150).  3.2% PVC burden was also noted.  No triggered events.   Skin cancer    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, squamous behind ear   Sleep apnea    no Cpap   Subdural hematoma, post-traumatic (HCC) 2008   fell off truck hit head on concrete    Past Surgical History:  Procedure Laterality Date   APPLICATION OF CRANIAL NAVIGATION Left 04/29/2020   Procedure: APPLICATION OF CRANIAL NAVIGATION;  Surgeon: Cannon Champion, MD;  Location: [facility] OR;  Service: Neurosurgery;  Laterality: Left;   arthroscopic knee     COLONOSCOPY N/A 11/29/2018   Procedure: COLONOSCOPY;  Surgeon: Ruby Corporal, MD;  Location: [facility];  Service: Endoscopy;  Laterality: N/A;  730-rescheduled 9/2 same time per Ann   CRANIOTOMY Left 04/29/2020   Procedure: LEFT CRANIECTOMY WITH TUMOR EXCISION;  Surgeon: Cannon Champion, MD;  Location: [facility] OR;  Service: Neurosurgery;  Laterality: Left;   KNEE SURGERY Right    NASAL SEPTOPLASTY W/ TURBINOPLASTY Bilateral 03/19/2020   Procedure: NASAL SEPTOPLASTY WITH BILATERAL  TURBINATE REDUCTION;  Surgeon: Reynold Caves, MD;  Location: [facility] OR;  Service: ENT;  Laterality: Bilateral;   VASECTOMY     Patient Active Problem List   Diagnosis Date Noted   Current use of long term anticoagulation 05/06/2022   Seizure (HCC) 08/13/2020   Seizures (HCC) 08/12/2020   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   DVT (deep venous thrombosis) (HCC) 08/12/2020  COVID-19 virus infection 08/12/2020   Acute lower UTI 07/14/2020   Delirium 07/06/2020   Sleep apnea 07/06/2020   GERD (gastroesophageal reflux disease) 07/06/2020   Tetraplegia (HCC) 06/25/2020   Sundowning 06/25/2020   Slow transit constipation    Essential hypertension    Hypokalemia    Postoperative pain    Meningioma (HCC) 05/02/2020   Brain tumor (HCC) 04/28/2020   S/P nasal septoplasty 03/19/2020   Special screening for malignant neoplasms, colon 01/09/2018    ONSET DATE: 04/03/2023  REFERRING DIAG: G82.50 (ICD-10-CM) - Tetraplegia (HCC) D32.9 (ICD-10-CM) - Meningioma (HCC)  THERAPY DIAG:  Muscle weakness (generalized)  Other abnormalities of gait and mobility  Abnormal posture  Rationale for Evaluation and Treatment: Rehabilitation  SUBJECTIVE:                                                                                                                                                                                              SUBJECTIVE STATEMENT: Pt reports doing well. Was able to stand up to the RW and take a few steps this weekend. No pain or falls.   Pt accompanied by: self and significant other wife [name]  PERTINENT HISTORY: anxiety, OA, cancer, subdural hematoma   PAIN:  Are you having pain? No  PRECAUTIONS: Fall  RED FLAGS: None   WEIGHT BEARING RESTRICTIONS: No  FALLS: Has patient fallen in last 6 months? No  LIVING ENVIRONMENT: Lives with: lives with their spouse Lives in: House/apartment Home is power wheelchair accessible Has following equipment at home: Wheelchair (power), Ramped entry, and slide board, [name], handicap accessible bathroom, hospital bed  PLOF: Independent with household mobility with device, Needs assistance with ADLs, and Needs assistance with transfers  PATIENT GOALS: "stand pivot transfer" "take steps in // bars"  OBJECTIVE:  Note: Objective measures were completed at Evaluation unless otherwise noted.  DIAGNOSTIC FINDINGS: None relevant to this POC  COGNITION: Overall cognitive status: Impaired   SENSATION: No N/T per patient report  EDEMA:  Yes in BLE, wears compression stockings  POSTURE: rounded shoulders, forward head, and posterior pelvic tilt  LOWER EXTREMITY ROM:     Passive  Right Eval Left Eval  Hip flexion Tight hip flexors Tight hip flexors  Hip extension    Hip abduction    Hip adduction    Hip internal rotation    Hip external rotation    Knee flexion    Knee extension Tight HS Tight HS  Ankle dorsiflexion Tight gastroc Tight gastroc  Ankle plantarflexion    Ankle inversion    Ankle eversion     (Blank rows = not tested)  LOWER EXTREMITY MMT:    MMT Right Eval Left Eval  Hip flexion 2- 3  Hip extension    Hip abduction    Hip adduction     Hip internal rotation    Hip external rotation    Knee flexion 2- 3  Knee extension 2- 3  Ankle dorsiflexion 0 1  Ankle plantarflexion    Ankle inversion    Ankle eversion    (Blank rows = not tested)  BED MOBILITY:  Sit to supine Mod A Supine to sit Min A Min A for trunk for supine to sit Mod A for BLE for sit to supine  TRANSFERS: Assistive device utilized: stedy  Sit to stand: SBA Stand to sit: SBA Chair to chair: dependent with use of stedy  VITALS  There were no vitals filed for this visit.                                                                                                                              TREATMENT:   NMR Pt performed sit to stands throughout session w/CGA and RW   Gait pattern: step to pattern, step through pattern, decreased step length- Right, decreased stride length, decreased hip/knee flexion- Right, decreased ankle dorsiflexion- Right, decreased ankle dorsiflexion- Left, lateral hip instability, trunk flexed, and poor foot clearance- Right Distance walked: 16', 19', 19' and 18'  Assistive device utilized: Environmental consultant - 2 wheeled Level of assistance: CGA x 1 plus close WC follow Comments: No shoe cover used today. Pt able to progress RLE well independently, w/min cues to facilitate step-through pattern w/RLE. No cues required for wider BOS as pt not scissoring this date. Pt able to manage AD independently w/reduced tremor as well. Lengthy rest breaks still required in between gait trials due to "9.5/10" fatigue     PATIENT EDUCATION: Education details: Start working on walking at home (a few steps fwd and then retro), will work on [employer] gait training in future for improved endurance  Person educated: Patient and Spouse Education method: Medical illustrator Education comprehension: verbalized understanding  HOME EXERCISE PROGRAM: To be initiated as appropriate  GOALS: Goals reviewed with patient? Yes  SHORT TERM GOALS:  Target date: 05/05/2023  Pt will tolerate standing x 5 min with LRAD and CGA Baseline: x 2 min in stedy with CGA; x2 minutes w/RW and CGA (4/17) Goal status: IN PROGRESS    LONG TERM GOALS: Target date: 05/26/2023   Pt will tolerate standing x 10 min with LRAD and CGA Baseline: x 2 min in stedy with CGA  Goal status: NOT MET  2.  Pt will perform stand pivot transfer with LRAD and mod A Baseline: transfers with stedy; stand pivot w/CGA and RW (5/8) Goal status: MET  3.  Pt will ambulate x 5 ft in // bars with assist x 2 for safety Baseline: able to take one step in // bars per family report; pt ambulating up to 29'  w/min A and RW  Goal status: MET  NEW SHORT TERM GOALS:   Target date: 06/30/2023  Pt will perform stand pivots consistently w/RW and CGA x1 for improved independence and functional mobility  Baseline: CGA-min A x2  Goal status: INITIAL  2.  5x STS to be assessed at STG assessment and LTG written  Baseline:  Goal status: INITIAL  3.  Pt will ambulate 51' w/RW and min A x1 for improved endurance and functional mobility  Baseline: 52' w/CGA-min A x2 Goal status: INITIAL   NEW LONG TERM GOALS:  Target date: 07/28/2023  5x STS goal  Baseline:  Goal status: INITIAL  2.  Pt will ambulate 39' w/RW and CGA x1 for improved independence and endurance  Baseline: 25' w/CGA-min A x2  Goal status: INITIAL    ASSESSMENT:  CLINICAL IMPRESSION: Emphasis of skilled PT session on gait training w/RW. Pt ambulated a total of 72' this date w/CGA only and no shoe cover, as pt able to lift RLE independently. Pt initially demonstrating step-to pattern w/RLE but did progress to step-through pattern by end of session. Pt most limited by fatigue, requiring lengthy seated rest breaks between gait trials. Plan on trying some EMOM-style gait training in future sessions to improve endurance. Also encouraged pt and [name] to start gait training at home as pt no longer requires heavy assist.   Continue POC.    OBJECTIVE IMPAIRMENTS: decreased activity tolerance, decreased balance, decreased cognition, decreased endurance, decreased knowledge of condition, decreased knowledge of use of DME, decreased mobility, difficulty walking, decreased ROM, decreased strength, increased edema, and impaired UE functional use.   ACTIVITY LIMITATIONS: carrying, lifting, bending, standing, transfers, and bed mobility  PARTICIPATION LIMITATIONS: meal prep, cleaning, laundry, and community activity  PERSONAL FACTORS: Fitness, Time since onset of injury/illness/exacerbation, and 1-2 comorbidities:   anxiety, OA, cancer, subdural hematoma are also affecting patient's functional outcome.   REHAB POTENTIAL: Good  CLINICAL DECISION MAKING: Stable/uncomplicated  EVALUATION COMPLEXITY: High  PLAN:  PT FREQUENCY: 1-2x/week  PT DURATION: 6 weeks + 8 weeks (recert)  PLANNED INTERVENTIONS: 16109- PT Re-evaluation, 97110-Therapeutic exercises, 97530- Therapeutic activity, 97112- Neuromuscular re-education, 97535- Self Care, 60454- Manual therapy, [phone]- Gait training, [phone]- Orthotic Fit/training, [phone]- Electrical stimulation (manual), Patient/Family education, Balance training, Taping, Dry Needling, Joint mobilization, Cognitive remediation, DME instructions, Wheelchair mobility training, Cryotherapy, and Moist heat  PLAN FOR NEXT SESSION: Work on Oncologist, retro gait, continues gait training w/RW (w/shoe cover on RLE). Sit to stands. EMOM training    Curtistine Downer Tsion Inghram, PT, DPT 06/07/2023, 11:54 AM

## 2023-06-09 ENCOUNTER — Ambulatory Visit: Payer: Self-pay | Admitting: Physical Therapy

## 2023-06-09 NOTE — Therapy (Incomplete)
 OUTPATIENT PHYSICAL THERAPY NEURO TREATMENT   Patient Name: Anthony Downs MRN: [id number] DOB:07-23-1950, 73 y.o., male Today's Date: 06/09/2023   PCP: Kathyleen Parkins, MD REFERRING PROVIDER: Rawland Caddy, MD    END OF SESSION:             Past Medical History:  Diagnosis Date   Acid reflux    Anxiety    Arthritis    Asthma    as a child   Basal cell carcinoma 01/26/2021   RIGHT POST SURICULAR INFERIOR   Basal cell carcinoma 01/26/2021   RIGHT POST AURICULAR SUPERIOR   Benign brain tumor (HCC)    Chest pain    a. 09/2019 MV: No ischemia.   Complication of anesthesia    slow to wake up and pain medicines make him sick   GERD (gastroesophageal reflux disease)    History of DVT (deep vein thrombosis)    History of echocardiogram    a. 09/2019 Echo: EF 60-65%, nl RV fxn; b. 09/2022 Echo: EF of 60-65% without regional wall motion abnormalities, normal RV size/function, and borderline dilatation of the aortic root at 39 mm.   Hypercholesteremia    Hypertension    PONV (postoperative nausea and vomiting)    PVC's (premature ventricular contractions)    a. 08/2022 Zio: predominantly sinus rhythm with an average heart rate of 71 (50-150), 5 brief SVT runs (longest 10.2 seconds with max rate of 150).  3.2% PVC burden was also noted.  No triggered events.   Skin cancer    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, squamous behind ear   Sleep apnea    no Cpap   Subdural hematoma, post-traumatic (HCC) 2008   fell off truck hit head on concrete   Past Surgical History:  Procedure Laterality Date   APPLICATION OF CRANIAL NAVIGATION Left 04/29/2020   Procedure: APPLICATION OF CRANIAL NAVIGATION;  Surgeon: Cannon Champion, MD;  Location: [facility] OR;  Service: Neurosurgery;  Laterality: Left;   arthroscopic knee     COLONOSCOPY N/A 11/29/2018   Procedure: COLONOSCOPY;  Surgeon: Ruby Corporal, MD;  Location: [facility];  Service: Endoscopy;  Laterality: N/A;   730-rescheduled 9/2 same time per Ann   CRANIOTOMY Left 04/29/2020   Procedure: LEFT CRANIECTOMY WITH TUMOR EXCISION;  Surgeon: Cannon Champion, MD;  Location: [facility] OR;  Service: Neurosurgery;  Laterality: Left;   KNEE SURGERY Right    NASAL SEPTOPLASTY W/ TURBINOPLASTY Bilateral 03/19/2020   Procedure: NASAL SEPTOPLASTY WITH BILATERAL  TURBINATE REDUCTION;  Surgeon: Reynold Caves, MD;  Location: [facility] OR;  Service: ENT;  Laterality: Bilateral;   VASECTOMY     Patient Active Problem List   Diagnosis Date Noted   Current use of long term anticoagulation 05/06/2022   Seizure (HCC) 08/13/2020   Seizures (HCC) 08/12/2020   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   DVT (deep venous thrombosis) (HCC) 08/12/2020   COVID-19 virus infection 08/12/2020   Acute lower UTI 07/14/2020   Delirium 07/06/2020   Sleep apnea 07/06/2020   GERD (gastroesophageal reflux disease) 07/06/2020   Tetraplegia (HCC) 06/25/2020   Sundowning 06/25/2020   Slow transit constipation    Essential hypertension    Hypokalemia    Postoperative pain    Meningioma (HCC) 05/02/2020   Brain tumor (HCC) 04/28/2020   S/P nasal septoplasty 03/19/2020   Special screening for malignant neoplasms, colon 01/09/2018    ONSET DATE: 04/03/2023  REFERRING DIAG: G82.50 (ICD-10-CM) - Tetraplegia (HCC) D32.9 (ICD-10-CM) - Meningioma (HCC)  THERAPY DIAG:  No diagnosis found.  Rationale for Evaluation and Treatment: Rehabilitation  SUBJECTIVE:                                                                                                                                                                                             SUBJECTIVE STATEMENT: Pt reports doing well. Was able to stand up to the RW and take a few steps this weekend. No pain or falls. ***  Pt accompanied by: self and significant other wife Anthony Downs  PERTINENT HISTORY: anxiety, OA, cancer, subdural hematoma   PAIN:  Are you having pain? No  PRECAUTIONS: Fall  RED  FLAGS: None   WEIGHT BEARING RESTRICTIONS: No  FALLS: Has patient fallen in last 6 months? No  LIVING ENVIRONMENT: Lives with: lives with their spouse Lives in: House/apartment Home is power wheelchair accessible Has following equipment at home: Wheelchair (power), Ramped entry, and slide board, [name], handicap accessible bathroom, hospital bed  PLOF: Independent with household mobility with device, Needs assistance with ADLs, and Needs assistance with transfers  PATIENT GOALS: "stand pivot transfer" "take steps in // bars"  OBJECTIVE:  Note: Objective measures were completed at Evaluation unless otherwise noted.  DIAGNOSTIC FINDINGS: None relevant to this POC  COGNITION: Overall cognitive status: Impaired   SENSATION: No N/T per patient report  EDEMA:  Yes in BLE, wears compression stockings  POSTURE: rounded shoulders, forward head, and posterior pelvic tilt  LOWER EXTREMITY ROM:     Passive  Right Eval Left Eval  Hip flexion Tight hip flexors Tight hip flexors  Hip extension    Hip abduction    Hip adduction    Hip internal rotation    Hip external rotation    Knee flexion    Knee extension Tight HS Tight HS  Ankle dorsiflexion Tight gastroc Tight gastroc  Ankle plantarflexion    Ankle inversion    Ankle eversion     (Blank rows = not tested)  LOWER EXTREMITY MMT:    MMT Right Eval Left Eval  Hip flexion 2- 3  Hip extension    Hip abduction    Hip adduction    Hip internal rotation    Hip external rotation    Knee flexion 2- 3  Knee extension 2- 3  Ankle dorsiflexion 0 1  Ankle plantarflexion    Ankle inversion    Ankle eversion    (Blank rows = not tested)  BED MOBILITY:  Sit to supine Mod A Supine to sit Min A Min A for trunk for supine to sit Mod A for BLE for sit to supine  TRANSFERS: Assistive device utilized: stedy  Sit to stand: SBA Stand to sit: SBA Chair to chair: dependent with use of stedy  VITALS  There were no  vitals filed for this visit.                                                                                                                              TREATMENT:   NMR Pt performed sit to stands throughout session w/CGA and RW   Gait pattern: step to pattern, step through pattern, decreased step length- Right, decreased stride length, decreased hip/knee flexion- Right, decreased ankle dorsiflexion- Right, decreased ankle dorsiflexion- Left, lateral hip instability, trunk flexed, and poor foot clearance- Right Distance walked: 16', 19', 19' and 18'  Assistive device utilized: Environmental consultant - 2 wheeled Level of assistance: CGA x 1 plus close WC follow Comments: No shoe cover used today. Pt able to progress RLE well independently, w/min cues to facilitate step-through pattern w/RLE. No cues required for wider BOS as pt not scissoring this date. Pt able to manage AD independently w/reduced tremor as well. Lengthy rest breaks still required in between gait trials due to "9.5/10" fatigue   ***    PATIENT EDUCATION: Education details: Start working on walking at home (a few steps fwd and then retro), will work on [employer] gait training in future for improved endurance *** Person educated: Patient and Spouse Education method: Medical illustrator Education comprehension: verbalized understanding  HOME EXERCISE PROGRAM: To be initiated as appropriate  GOALS: Goals reviewed with patient? Yes  SHORT TERM GOALS: Target date: 05/05/2023  Pt will tolerate standing x 5 min with LRAD and CGA Baseline: x 2 min in stedy with CGA; x2 minutes w/RW and CGA (4/17) Goal status: IN PROGRESS    LONG TERM GOALS: Target date: 05/26/2023   Pt will tolerate standing x 10 min with LRAD and CGA Baseline: x 2 min in stedy with CGA  Goal status: NOT MET  2.  Pt will perform stand pivot transfer with LRAD and mod A Baseline: transfers with stedy; stand pivot w/CGA and RW (5/8) Goal status: MET  3.   Pt will ambulate x 5 ft in // bars with assist x 2 for safety Baseline: able to take one step in // bars per family report; pt ambulating up to 86' w/min A and RW  Goal status: MET  NEW SHORT TERM GOALS:   Target date: 06/30/2023  Pt will perform stand pivots consistently w/RW and CGA x1 for improved independence and functional mobility  Baseline: CGA-min A x2  Goal status: INITIAL  2.  5x STS to be assessed at STG assessment and LTG written  Baseline:  Goal status: INITIAL  3.  Pt will ambulate 43' w/RW and min A x1 for improved endurance and functional mobility  Baseline: 78' w/CGA-min A x2 Goal status: INITIAL   NEW LONG TERM GOALS:  Target date: 07/28/2023  5x STS goal  Baseline:  Goal status: INITIAL  2.  Pt will ambulate 45' w/RW and CGA x1 for improved independence and endurance  Baseline: 62' w/CGA-min A x2  Goal status: INITIAL    ASSESSMENT:  CLINICAL IMPRESSION: Emphasis of skilled PT session on gait training w/RW. *** Continue POC.    OBJECTIVE IMPAIRMENTS: decreased activity tolerance, decreased balance, decreased cognition, decreased endurance, decreased knowledge of condition, decreased knowledge of use of DME, decreased mobility, difficulty walking, decreased ROM, decreased strength, increased edema, and impaired UE functional use.   ACTIVITY LIMITATIONS: carrying, lifting, bending, standing, transfers, and bed mobility  PARTICIPATION LIMITATIONS: meal prep, cleaning, laundry, and community activity  PERSONAL FACTORS: Fitness, Time since onset of injury/illness/exacerbation, and 1-2 comorbidities:   anxiety, OA, cancer, subdural hematoma are also affecting patient's functional outcome.   REHAB POTENTIAL: Good  CLINICAL DECISION MAKING: Stable/uncomplicated  EVALUATION COMPLEXITY: High  PLAN:  PT FREQUENCY: 1-2x/week  PT DURATION: 6 weeks + 8 weeks (recert)  PLANNED INTERVENTIONS: 29528- PT Re-evaluation, 97110-Therapeutic exercises, 97530-  Therapeutic activity, 97112- Neuromuscular re-education, 97535- Self Care, 41324- Manual therapy, [phone]- Gait training, [phone]- Orthotic Fit/training, [phone]- Electrical stimulation (manual), Patient/Family education, Balance training, Taping, Dry Needling, Joint mobilization, Cognitive remediation, DME instructions, Wheelchair mobility training, Cryotherapy, and Moist heat  PLAN FOR NEXT SESSION: Work on Oncologist, retro gait, continues gait training w/RW (w/shoe cover on RLE). Sit to stands. EMOM training ***   Lorita Rosa, PT Lorita Rosa, PT, DPT, CSRS  06/09/2023, 8:06 AM

## 2023-06-13 ENCOUNTER — Encounter: Payer: Self-pay | Admitting: Urology

## 2023-06-14 ENCOUNTER — Other Ambulatory Visit: Payer: Self-pay

## 2023-06-14 ENCOUNTER — Ambulatory Visit: Admitting: Physical Therapy

## 2023-06-14 DIAGNOSIS — M6281 Muscle weakness (generalized): Secondary | ICD-10-CM | POA: Diagnosis not present

## 2023-06-14 DIAGNOSIS — R2681 Unsteadiness on feet: Secondary | ICD-10-CM

## 2023-06-14 DIAGNOSIS — R293 Abnormal posture: Secondary | ICD-10-CM

## 2023-06-14 DIAGNOSIS — R2689 Other abnormalities of gait and mobility: Secondary | ICD-10-CM

## 2023-06-14 MED ORDER — TAMSULOSIN HCL 0.4 MG PO CAPS
0.4000 mg | ORAL_CAPSULE | Freq: Two times a day (BID) | ORAL | 3 refills | Status: AC
  Filled 2023-09-12: qty 180, 90d supply, fill #0
  Filled 2023-12-19: qty 180, 90d supply, fill #1

## 2023-06-14 NOTE — Therapy (Signed)
 OUTPATIENT PHYSICAL THERAPY NEURO TREATMENT   Patient Name: Anthony Downs MRN: [id number] DOB:1950-10-26, 73 y.o., male Today's Date: 06/14/2023   PCP: Kathyleen Parkins, MD REFERRING PROVIDER: Rawland Caddy, MD    END OF SESSION:  PT End of Session - 06/14/23 1447     Visit Number 14    Number of Visits 29   with eval   Date for PT Re-Evaluation 07/28/23   recert   Authorization Type Medicare    PT Start Time 1445    PT Stop Time 1529    PT Time Calculation (min) 44 min    Equipment Utilized During Treatment Gait belt    Activity Tolerance Patient tolerated treatment well    Behavior During Therapy WFL for tasks assessed/performed                       Past Medical History:  Diagnosis Date   Acid reflux    Anxiety    Arthritis    Asthma    as a child   Basal cell carcinoma 01/26/2021   RIGHT POST SURICULAR INFERIOR   Basal cell carcinoma 01/26/2021   RIGHT POST AURICULAR SUPERIOR   Benign brain tumor (HCC)    Chest pain    a. 09/2019 MV: No ischemia.   Complication of anesthesia    slow to wake up and pain medicines make him sick   GERD (gastroesophageal reflux disease)    History of DVT (deep vein thrombosis)    History of echocardiogram    a. 09/2019 Echo: EF 60-65%, nl RV fxn; b. 09/2022 Echo: EF of 60-65% without regional wall motion abnormalities, normal RV size/function, and borderline dilatation of the aortic root at 39 mm.   Hypercholesteremia    Hypertension    PONV (postoperative nausea and vomiting)    PVC's (premature ventricular contractions)    a. 08/2022 Zio: predominantly sinus rhythm with an average heart rate of 71 (50-150), 5 brief SVT runs (longest 10.2 seconds with max rate of 150).  3.2% PVC burden was also noted.  No triggered events.   Skin cancer    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, squamous behind ear   Sleep apnea    no Cpap   Subdural hematoma, post-traumatic (HCC) 2008   fell off truck hit head on concrete    Past Surgical History:  Procedure Laterality Date   APPLICATION OF CRANIAL NAVIGATION Left 04/29/2020   Procedure: APPLICATION OF CRANIAL NAVIGATION;  Surgeon: Cannon Champion, MD;  Location: [facility] OR;  Service: Neurosurgery;  Laterality: Left;   arthroscopic knee     COLONOSCOPY N/A 11/29/2018   Procedure: COLONOSCOPY;  Surgeon: Ruby Corporal, MD;  Location: [facility];  Service: Endoscopy;  Laterality: N/A;  730-rescheduled 9/2 same time per Ann   CRANIOTOMY Left 04/29/2020   Procedure: LEFT CRANIECTOMY WITH TUMOR EXCISION;  Surgeon: Cannon Champion, MD;  Location: [facility] OR;  Service: Neurosurgery;  Laterality: Left;   KNEE SURGERY Right    NASAL SEPTOPLASTY W/ TURBINOPLASTY Bilateral 03/19/2020   Procedure: NASAL SEPTOPLASTY WITH BILATERAL  TURBINATE REDUCTION;  Surgeon: Reynold Caves, MD;  Location: [facility] OR;  Service: ENT;  Laterality: Bilateral;   VASECTOMY     Patient Active Problem List   Diagnosis Date Noted   Current use of long term anticoagulation 05/06/2022   Seizure (HCC) 08/13/2020   Seizures (HCC) 08/12/2020   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   DVT (deep venous thrombosis) (HCC) 08/12/2020  COVID-19 virus infection 08/12/2020   Acute lower UTI 07/14/2020   Delirium 07/06/2020   Sleep apnea 07/06/2020   GERD (gastroesophageal reflux disease) 07/06/2020   Tetraplegia (HCC) 06/25/2020   Sundowning 06/25/2020   Slow transit constipation    Essential hypertension    Hypokalemia    Postoperative pain    Meningioma (HCC) 05/02/2020   Brain tumor (HCC) 04/28/2020   S/P nasal septoplasty 03/19/2020   Special screening for malignant neoplasms, colon 01/09/2018    ONSET DATE: 04/03/2023  REFERRING DIAG: G82.50 (ICD-10-CM) - Tetraplegia (HCC) D32.9 (ICD-10-CM) - Meningioma (HCC)  THERAPY DIAG:  Muscle weakness (generalized)  Other abnormalities of gait and mobility  Abnormal posture  Unsteadiness on feet  Rationale for Evaluation and Treatment:  Rehabilitation  SUBJECTIVE:                                                                                                                                                                                             SUBJECTIVE STATEMENT: Pt reports doing well today. Pt was working on taking a few steps forwards/back at home with [name] and had a fall, he slid down along his lift chair and landed on the floor, likely he didn't pick up his R foot. They had to call EMS to get him back up off the floor but he had no injuries with the fall.  Pt has felt comfortable returning to taking 3 steps forwards/back at home with [name] since then.  Pt accompanied by: self and significant other wife [name]  PERTINENT HISTORY: anxiety, OA, cancer, subdural hematoma   PAIN:  Are you having pain? No  PRECAUTIONS: Fall  RED FLAGS: None   WEIGHT BEARING RESTRICTIONS: No  FALLS: Has patient fallen in last 6 months? No  LIVING ENVIRONMENT: Lives with: lives with their spouse Lives in: House/apartment Home is power wheelchair accessible Has following equipment at home: Wheelchair (power), Ramped entry, and slide board, [name], handicap accessible bathroom, hospital bed  PLOF: Independent with household mobility with device, Needs assistance with ADLs, and Needs assistance with transfers  PATIENT GOALS: "stand pivot transfer" "take steps in // bars"  OBJECTIVE:  Note: Objective measures were completed at Evaluation unless otherwise noted.  DIAGNOSTIC FINDINGS: None relevant to this POC  COGNITION: Overall cognitive status: Impaired   SENSATION: No N/T per patient report  EDEMA:  Yes in BLE, wears compression stockings  POSTURE: rounded shoulders, forward head, and posterior pelvic tilt  LOWER EXTREMITY ROM:     Passive  Right Eval Left Eval  Hip flexion Tight hip flexors Tight hip flexors  Hip extension    Hip abduction  Hip adduction    Hip internal rotation    Hip external  rotation    Knee flexion    Knee extension Tight HS Tight HS  Ankle dorsiflexion Tight gastroc Tight gastroc  Ankle plantarflexion    Ankle inversion    Ankle eversion     (Blank rows = not tested)  LOWER EXTREMITY MMT:    MMT Right Eval Left Eval  Hip flexion 2- 3  Hip extension    Hip abduction    Hip adduction    Hip internal rotation    Hip external rotation    Knee flexion 2- 3  Knee extension 2- 3  Ankle dorsiflexion 0 1  Ankle plantarflexion    Ankle inversion    Ankle eversion    (Blank rows = not tested)  BED MOBILITY:  Sit to supine Mod A Supine to sit Min A Min A for trunk for supine to sit Mod A for BLE for sit to supine  TRANSFERS: Assistive device utilized: stedy  Sit to stand: SBA Stand to sit: SBA Chair to chair: dependent with use of stedy  VITALS  There were no vitals filed for this visit.                                                                                                                              TREATMENT:   NMR EMOM in 5 min increments: Sit to stand, forward/backward steps x 2, stand to sit with rest break x 4 min CGA to stand and for balance with stepping Sit to stand, forwards/backwards steps x 5 reps, stand to sit with rest break x 3 min CGA to min A needed, more assist needed when stepping backwards and for RW management Sit to stand, forwards/backwards steps x 3 reps, stand to sit with rest break x 4 min Improved LE clearance stepping backwards   Gait pattern: decreased step length- Right, decreased stride length, decreased hip/knee flexion- Right, decreased hip/knee flexion- Left, decreased ankle dorsiflexion- Right, decreased ankle dorsiflexion- Left, scissoring, trunk flexed, and narrow BOS Distance walked: 16 ft, 17 ft Assistive device utilized: bari RW Level of assistance: CGA + PWC follow Comments: scissoring/narrow BOS, decreased step length RLE especially with onset of fatigue; some assist needed to advance  RW with onset of fatigue and cues to widen BOS     PATIENT EDUCATION: Education details: continue working on walking at home (a few steps fwd and then retro) Person educated: Patient and Spouse Education method: Medical illustrator Education comprehension: verbalized understanding  HOME EXERCISE PROGRAM: To be initiated as appropriate  GOALS: Goals reviewed with patient? Yes  SHORT TERM GOALS: Target date: 05/05/2023  Pt will tolerate standing x 5 min with LRAD and CGA Baseline: x 2 min in stedy with CGA; x2 minutes w/RW and CGA (4/17) Goal status: IN PROGRESS    LONG TERM GOALS: Target date: 05/26/2023   Pt will tolerate standing x 10 min with  LRAD and CGA Baseline: x 2 min in stedy with CGA  Goal status: NOT MET  2.  Pt will perform stand pivot transfer with LRAD and mod A Baseline: transfers with stedy; stand pivot w/CGA and RW (5/8) Goal status: MET  3.  Pt will ambulate x 5 ft in // bars with assist x 2 for safety Baseline: able to take one step in // bars per family report; pt ambulating up to 3' w/min A and RW  Goal status: MET  NEW SHORT TERM GOALS:   Target date: 06/30/2023  Pt will perform stand pivots consistently w/RW and CGA x1 for improved independence and functional mobility  Baseline: CGA-min A x2  Goal status: INITIAL  2.  5x STS to be assessed at STG assessment and LTG written  Baseline:  Goal status: INITIAL  3.  Pt will ambulate 38' w/RW and min A x1 for improved endurance and functional mobility  Baseline: 19' w/CGA-min A x2 Goal status: INITIAL   NEW LONG TERM GOALS:  Target date: 07/28/2023  5x STS goal  Baseline:  Goal status: INITIAL  2.  Pt will ambulate 33' w/RW and CGA x1 for improved independence and endurance  Baseline: 67' w/CGA-min A x2  Goal status: INITIAL    ASSESSMENT:  CLINICAL IMPRESSION: Emphasis of skilled PT session on initiating use of EMOM strategy for gait training in order to improve patient's  endurance. He does well with EMOM strategy with decreased time spent resting and/or getting distracted during session and more time spent focusing on performing functional therapy tasks. Pt more fatigued by end of session as well due to completing more tasks during session. Pt continues to benefit from skilled PT services to work towards increased safety and independence with functional mobility. Continue POC.    OBJECTIVE IMPAIRMENTS: decreased activity tolerance, decreased balance, decreased cognition, decreased endurance, decreased knowledge of condition, decreased knowledge of use of DME, decreased mobility, difficulty walking, decreased ROM, decreased strength, increased edema, and impaired UE functional use.   ACTIVITY LIMITATIONS: carrying, lifting, bending, standing, transfers, and bed mobility  PARTICIPATION LIMITATIONS: meal prep, cleaning, laundry, and community activity  PERSONAL FACTORS: Fitness, Time since onset of injury/illness/exacerbation, and 1-2 comorbidities:   anxiety, OA, cancer, subdural hematoma are also affecting patient's functional outcome.   REHAB POTENTIAL: Good  CLINICAL DECISION MAKING: Stable/uncomplicated  EVALUATION COMPLEXITY: High  PLAN:  PT FREQUENCY: 1-2x/week  PT DURATION: 6 weeks + 8 weeks (recert)  PLANNED INTERVENTIONS: 47829- PT Re-evaluation, 97110-Therapeutic exercises, 97530- Therapeutic activity, 97112- Neuromuscular re-education, 97535- Self Care, 56213- Manual therapy, [phone]- Gait training, [phone]- Orthotic Fit/training, [phone]- Electrical stimulation (manual), Patient/Family education, Balance training, Taping, Dry Needling, Joint mobilization, Cognitive remediation, DME instructions, Wheelchair mobility training, Cryotherapy, and Moist heat  PLAN FOR NEXT SESSION: Work on Oncologist, retro gait, continues gait training w/RW (w/shoe cover on RLE). Sit to stands. EMOM training   Lorita Rosa, PT Lorita Rosa, PT, DPT,  CSRS  06/14/2023, 3:30 PM

## 2023-06-14 NOTE — Telephone Encounter (Signed)
 Advise and I will refill to match advisement

## 2023-06-16 ENCOUNTER — Ambulatory Visit: Admitting: Physical Therapy

## 2023-06-16 DIAGNOSIS — R293 Abnormal posture: Secondary | ICD-10-CM

## 2023-06-16 DIAGNOSIS — M6281 Muscle weakness (generalized): Secondary | ICD-10-CM | POA: Diagnosis not present

## 2023-06-16 DIAGNOSIS — R2689 Other abnormalities of gait and mobility: Secondary | ICD-10-CM

## 2023-06-16 NOTE — Therapy (Signed)
 OUTPATIENT PHYSICAL THERAPY NEURO TREATMENT   Patient Name: Anthony Downs MRN: [id number] DOB:September 24, 1950, 73 y.o., male Today's Date: 06/16/2023   PCP: Kathyleen Parkins, MD REFERRING PROVIDER: Rawland Caddy, MD    END OF SESSION:  PT End of Session - 06/16/23 1318     Visit Number 15    Number of Visits 29   with eval   Date for PT Re-Evaluation 07/28/23   recert   Authorization Type Medicare    PT Start Time 1316    PT Stop Time 1357    PT Time Calculation (min) 41 min    Equipment Utilized During Treatment Gait belt    Activity Tolerance Patient tolerated treatment well    Behavior During Therapy WFL for tasks assessed/performed                        Past Medical History:  Diagnosis Date   Acid reflux    Anxiety    Arthritis    Asthma    as a child   Basal cell carcinoma 01/26/2021   RIGHT POST SURICULAR INFERIOR   Basal cell carcinoma 01/26/2021   RIGHT POST AURICULAR SUPERIOR   Benign brain tumor (HCC)    Chest pain    a. 09/2019 MV: No ischemia.   Complication of anesthesia    slow to wake up and pain medicines make him sick   GERD (gastroesophageal reflux disease)    History of DVT (deep vein thrombosis)    History of echocardiogram    a. 09/2019 Echo: EF 60-65%, nl RV fxn; b. 09/2022 Echo: EF of 60-65% without regional wall motion abnormalities, normal RV size/function, and borderline dilatation of the aortic root at 39 mm.   Hypercholesteremia    Hypertension    PONV (postoperative nausea and vomiting)    PVC's (premature ventricular contractions)    a. 08/2022 Zio: predominantly sinus rhythm with an average heart rate of 71 (50-150), 5 brief SVT runs (longest 10.2 seconds with max rate of 150).  3.2% PVC burden was also noted.  No triggered events.   Skin cancer    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, squamous behind ear   Sleep apnea    no Cpap   Subdural hematoma, post-traumatic (HCC) 2008   fell off truck hit head on concrete    Past Surgical History:  Procedure Laterality Date   APPLICATION OF CRANIAL NAVIGATION Left 04/29/2020   Procedure: APPLICATION OF CRANIAL NAVIGATION;  Surgeon: Cannon Champion, MD;  Location: [facility] OR;  Service: Neurosurgery;  Laterality: Left;   arthroscopic knee     COLONOSCOPY N/A 11/29/2018   Procedure: COLONOSCOPY;  Surgeon: Ruby Corporal, MD;  Location: [facility];  Service: Endoscopy;  Laterality: N/A;  730-rescheduled 9/2 same time per Ann   CRANIOTOMY Left 04/29/2020   Procedure: LEFT CRANIECTOMY WITH TUMOR EXCISION;  Surgeon: Cannon Champion, MD;  Location: [facility] OR;  Service: Neurosurgery;  Laterality: Left;   KNEE SURGERY Right    NASAL SEPTOPLASTY W/ TURBINOPLASTY Bilateral 03/19/2020   Procedure: NASAL SEPTOPLASTY WITH BILATERAL  TURBINATE REDUCTION;  Surgeon: Reynold Caves, MD;  Location: [facility] OR;  Service: ENT;  Laterality: Bilateral;   VASECTOMY     Patient Active Problem List   Diagnosis Date Noted   Current use of long term anticoagulation 05/06/2022   Seizure (HCC) 08/13/2020   Seizures (HCC) 08/12/2020   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   DVT (deep venous thrombosis) (HCC) 08/12/2020  COVID-19 virus infection 08/12/2020   Acute lower UTI 07/14/2020   Delirium 07/06/2020   Sleep apnea 07/06/2020   GERD (gastroesophageal reflux disease) 07/06/2020   Tetraplegia (HCC) 06/25/2020   Sundowning 06/25/2020   Slow transit constipation    Essential hypertension    Hypokalemia    Postoperative pain    Meningioma (HCC) 05/02/2020   Brain tumor (HCC) 04/28/2020   S/P nasal septoplasty 03/19/2020   Special screening for malignant neoplasms, colon 01/09/2018    ONSET DATE: 04/03/2023  REFERRING DIAG: G82.50 (ICD-10-CM) - Tetraplegia (HCC) D32.9 (ICD-10-CM) - Meningioma (HCC)  THERAPY DIAG:  Muscle weakness (generalized)  Other abnormalities of gait and mobility  Abnormal posture  Rationale for Evaluation and Treatment: Rehabilitation  SUBJECTIVE:                                                                                                                                                                                              SUBJECTIVE STATEMENT: Pt reports doing well today. No changes since last visit.   Pt accompanied by: self and friend [place]   PERTINENT HISTORY: anxiety, OA, cancer, subdural hematoma   PAIN:  Are you having pain? No  PRECAUTIONS: Fall  RED FLAGS: None   WEIGHT BEARING RESTRICTIONS: No  FALLS: Has patient fallen in last 6 months? No  LIVING ENVIRONMENT: Lives with: lives with their spouse Lives in: House/apartment Home is power wheelchair accessible Has following equipment at home: Wheelchair (power), Ramped entry, and slide board, [name], handicap accessible bathroom, hospital bed  PLOF: Independent with household mobility with device, Needs assistance with ADLs, and Needs assistance with transfers  PATIENT GOALS: "stand pivot transfer" "take steps in // bars"  OBJECTIVE:  Note: Objective measures were completed at Evaluation unless otherwise noted.  DIAGNOSTIC FINDINGS: None relevant to this POC  COGNITION: Overall cognitive status: Impaired   SENSATION: No N/T per patient report  EDEMA:  Yes in BLE, wears compression stockings  POSTURE: rounded shoulders, forward head, and posterior pelvic tilt  LOWER EXTREMITY ROM:     Passive  Right Eval Left Eval  Hip flexion Tight hip flexors Tight hip flexors  Hip extension    Hip abduction    Hip adduction    Hip internal rotation    Hip external rotation    Knee flexion    Knee extension Tight HS Tight HS  Ankle dorsiflexion Tight gastroc Tight gastroc  Ankle plantarflexion    Ankle inversion    Ankle eversion     (Blank rows = not tested)  LOWER EXTREMITY MMT:    MMT Right Eval Left Eval  Hip  flexion 2- 3  Hip extension    Hip abduction    Hip adduction    Hip internal rotation    Hip external rotation    Knee  flexion 2- 3  Knee extension 2- 3  Ankle dorsiflexion 0 1  Ankle plantarflexion    Ankle inversion    Ankle eversion    (Blank rows = not tested)  BED MOBILITY:  Sit to supine Mod A Supine to sit Min A Min A for trunk for supine to sit Mod A for BLE for sit to supine  TRANSFERS: Assistive device utilized: stedy  Sit to stand: SBA Stand to sit: SBA Chair to chair: dependent with use of stedy  VITALS  There were no vitals filed for this visit.                                                                                                                              TREATMENT:   NMR EMOM in 4 min increments: Sit to stand pivot from [facility] <> mat w/RW x2  On first transfer, pt provided w/mod multimodal cues for proper technique and CGA  On second transfer, pt required max multimodal cues and min A to set up for transfer. When turning to sit to chair, pt impulsively sat down too soon, sitting on arm of chair (that was turned on), causing chair to move backwards and almost causing a fall.  Extensive education provided to pt regarding safety w/transfers and need to be independent w/proper set up in order to progress in PT. Pt has performed these transfers several times, yet relies on therapists to cue him on set up rather than perform independently like he can. Strongly encouraged pt to start exhibiting more independence and safety w/transfers (turning all the way before he sits, ensuring his chair is turned off, reaching back for the chair prior to sitting) to ensure safety at home and promote independence w/mobility. Pt verbalized understanding.  EMOM in 5 minute increments:  Sit to stand w/gait distance goal (20-25') w/RW and SBA- CGA:  Round 1: 25'  Round 2: 20'  Round 3: 25'  Round 4: 20'  Total: 90'  Gait pattern: decreased step length- Right, decreased stride length, decreased hip/knee flexion- Right, decreased hip/knee flexion- Left, decreased ankle dorsiflexion- Right, decreased  ankle dorsiflexion- Left, scissoring, trunk flexed, and narrow BOS Distance walked: 6' Assistive device utilized: bari RW Level of assistance: SBA and CGA + PWC follow Comments: Mod verbal cues required to remind pt to set up for next sit to stand transfer in between rounds. Pt initially requiring SBA for sit <>Stand but regressed to CGA w/fatigue on rounds 3&4. Pt able to manage AD independently and no scissoring of gait noted. Pt adequately reached back for chair prior to sitting on rounds 1-3 but impulsively sat down without notice on round 4 due to fatigue.    PATIENT EDUCATION: Education details: continue working on walking at home (a  few steps fwd and then retro), importance of being more independent w/safe set up of transfers  Person educated: Patient Education method: Explanation and Demonstration Education comprehension: verbalized understanding  HOME EXERCISE PROGRAM: To be initiated as appropriate  GOALS: Goals reviewed with patient? Yes  SHORT TERM GOALS: Target date: 05/05/2023  Pt will tolerate standing x 5 min with LRAD and CGA Baseline: x 2 min in stedy with CGA; x2 minutes w/RW and CGA (4/17) Goal status: IN PROGRESS    LONG TERM GOALS: Target date: 05/26/2023   Pt will tolerate standing x 10 min with LRAD and CGA Baseline: x 2 min in stedy with CGA  Goal status: NOT MET  2.  Pt will perform stand pivot transfer with LRAD and mod A Baseline: transfers with stedy; stand pivot w/CGA and RW (5/8) Goal status: MET  3.  Pt will ambulate x 5 ft in // bars with assist x 2 for safety Baseline: able to take one step in // bars per family report; pt ambulating up to 39' w/min A and RW  Goal status: MET  NEW SHORT TERM GOALS:   Target date: 06/30/2023  Pt will perform stand pivots consistently w/RW and CGA x1 for improved independence and functional mobility  Baseline: CGA-min A x2  Goal status: INITIAL  2.  5x STS to be assessed at STG assessment and LTG written   Baseline:  Goal status: INITIAL  3.  Pt will ambulate 58' w/RW and min A x1 for improved endurance and functional mobility  Baseline: 67' w/CGA-min A x2 Goal status: INITIAL   NEW LONG TERM GOALS:  Target date: 07/28/2023  5x STS goal  Baseline:  Goal status: INITIAL  2.  Pt will ambulate 21' w/RW and CGA x1 for improved independence and endurance  Baseline: 57' w/CGA-min A x2  Goal status: INITIAL    ASSESSMENT:  CLINICAL IMPRESSION: Emphasis of skilled PT session on continued use of EMOM method for improved endurance and to promote pt's independence w/transfers. Pt continues to rely heavily on caregiver/therapists setting up sit to stand transfers despite pt being independent w/this. Pt demonstrated poor safety awareness on sit to stands today, sitting in his chair prior to being fully turned, resulting in sitting on his joystick and driving the chair backwards, narrowly avoiding a fall. Pt able to teach back how he could have performed the transfer better and demonstrated improved independence and safety w/transfers for remainder of session. Pt ambulated a total of 90' in 20 minutes this date w/SBA-CGA only, the furthest distance so far in PT. Continue POC.    OBJECTIVE IMPAIRMENTS: decreased activity tolerance, decreased balance, decreased cognition, decreased endurance, decreased knowledge of condition, decreased knowledge of use of DME, decreased mobility, difficulty walking, decreased ROM, decreased strength, increased edema, and impaired UE functional use.   ACTIVITY LIMITATIONS: carrying, lifting, bending, standing, transfers, and bed mobility  PARTICIPATION LIMITATIONS: meal prep, cleaning, laundry, and community activity  PERSONAL FACTORS: Fitness, Time since onset of injury/illness/exacerbation, and 1-2 comorbidities:   anxiety, OA, cancer, subdural hematoma are also affecting patient's functional outcome.   REHAB POTENTIAL: Good  CLINICAL DECISION MAKING:  Stable/uncomplicated  EVALUATION COMPLEXITY: High  PLAN:  PT FREQUENCY: 1-2x/week  PT DURATION: 6 weeks + 8 weeks (recert)  PLANNED INTERVENTIONS: 16109- PT Re-evaluation, 97110-Therapeutic exercises, 97530- Therapeutic activity, 97112- Neuromuscular re-education, 97535- Self Care, 60454- Manual therapy, [phone]- Gait training, [phone]- Orthotic Fit/training, [phone]- Electrical stimulation (manual), Patient/Family education, Balance training, Taping, Dry Needling, Joint mobilization, Cognitive remediation, DME  instructions, Wheelchair mobility training, Cryotherapy, and Moist heat  PLAN FOR NEXT SESSION: Work on Oncologist, retro gait, continues gait training w/RW (w/shoe cover on RLE). Sit to stands. EMOM training, independence w/transfers    Tyqwan Pink E Nil Bolser, PT, DPT  06/16/2023, 1:58 PM

## 2023-06-21 ENCOUNTER — Ambulatory Visit: Attending: Physical Medicine & Rehabilitation | Admitting: Physical Therapy

## 2023-06-21 DIAGNOSIS — R278 Other lack of coordination: Secondary | ICD-10-CM | POA: Diagnosis present

## 2023-06-21 DIAGNOSIS — R2689 Other abnormalities of gait and mobility: Secondary | ICD-10-CM | POA: Diagnosis present

## 2023-06-21 DIAGNOSIS — M6281 Muscle weakness (generalized): Secondary | ICD-10-CM | POA: Insufficient documentation

## 2023-06-21 DIAGNOSIS — R29898 Other symptoms and signs involving the musculoskeletal system: Secondary | ICD-10-CM | POA: Diagnosis present

## 2023-06-21 DIAGNOSIS — R2681 Unsteadiness on feet: Secondary | ICD-10-CM | POA: Insufficient documentation

## 2023-06-21 DIAGNOSIS — R29818 Other symptoms and signs involving the nervous system: Secondary | ICD-10-CM | POA: Insufficient documentation

## 2023-06-21 DIAGNOSIS — R293 Abnormal posture: Secondary | ICD-10-CM | POA: Diagnosis present

## 2023-06-21 NOTE — Therapy (Signed)
 OUTPATIENT PHYSICAL THERAPY NEURO TREATMENT   Patient Name: Anthony Downs MRN: [id number] DOB:07/17/1950, 73 y.o., male Today's Date: 06/21/2023   PCP: Kathyleen Parkins, MD REFERRING PROVIDER: Rawland Caddy, MD    END OF SESSION:  PT End of Session - 06/21/23 1317     Visit Number 16    Number of Visits 29   with eval   Date for PT Re-Evaluation 07/28/23   recert   Authorization Type Medicare    PT Start Time 1315    PT Stop Time 1356    PT Time Calculation (min) 41 min    Equipment Utilized During Treatment Gait belt    Activity Tolerance Patient limited by fatigue    Behavior During Therapy WFL for tasks assessed/performed                Past Medical History:  Diagnosis Date   Acid reflux    Anxiety    Arthritis    Asthma    as a child   Basal cell carcinoma 01/26/2021   RIGHT POST SURICULAR INFERIOR   Basal cell carcinoma 01/26/2021   RIGHT POST AURICULAR SUPERIOR   Benign brain tumor (HCC)    Chest pain    a. 09/2019 MV: No ischemia.   Complication of anesthesia    slow to wake up and pain medicines make him sick   GERD (gastroesophageal reflux disease)    History of DVT (deep vein thrombosis)    History of echocardiogram    a. 09/2019 Echo: EF 60-65%, nl RV fxn; b. 09/2022 Echo: EF of 60-65% without regional wall motion abnormalities, normal RV size/function, and borderline dilatation of the aortic root at 39 mm.   Hypercholesteremia    Hypertension    PONV (postoperative nausea and vomiting)    PVC's (premature ventricular contractions)    a. 08/2022 Zio: predominantly sinus rhythm with an average heart rate of 71 (50-150), 5 brief SVT runs (longest 10.2 seconds with max rate of 150).  3.2% PVC burden was also noted.  No triggered events.   Skin cancer    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, squamous behind ear   Sleep apnea    no Cpap   Subdural hematoma, post-traumatic (HCC) 2008   fell off truck hit head on concrete   Past Surgical History:   Procedure Laterality Date   APPLICATION OF CRANIAL NAVIGATION Left 04/29/2020   Procedure: APPLICATION OF CRANIAL NAVIGATION;  Surgeon: Cannon Champion, MD;  Location: [facility] OR;  Service: Neurosurgery;  Laterality: Left;   arthroscopic knee     COLONOSCOPY N/A 11/29/2018   Procedure: COLONOSCOPY;  Surgeon: Ruby Corporal, MD;  Location: [facility];  Service: Endoscopy;  Laterality: N/A;  730-rescheduled 9/2 same time per Ann   CRANIOTOMY Left 04/29/2020   Procedure: LEFT CRANIECTOMY WITH TUMOR EXCISION;  Surgeon: Cannon Champion, MD;  Location: [facility] OR;  Service: Neurosurgery;  Laterality: Left;   KNEE SURGERY Right    NASAL SEPTOPLASTY W/ TURBINOPLASTY Bilateral 03/19/2020   Procedure: NASAL SEPTOPLASTY WITH BILATERAL  TURBINATE REDUCTION;  Surgeon: Reynold Caves, MD;  Location: [facility] OR;  Service: ENT;  Laterality: Bilateral;   VASECTOMY     Patient Active Problem List   Diagnosis Date Noted   Current use of long term anticoagulation 05/06/2022   Seizure (HCC) 08/13/2020   Seizures (HCC) 08/12/2020   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   DVT (deep venous thrombosis) (HCC) 08/12/2020   COVID-19 virus infection 08/12/2020  Acute lower UTI 07/14/2020   Delirium 07/06/2020   Sleep apnea 07/06/2020   GERD (gastroesophageal reflux disease) 07/06/2020   Tetraplegia (HCC) 06/25/2020   Sundowning 06/25/2020   Slow transit constipation    Essential hypertension    Hypokalemia    Postoperative pain    Meningioma (HCC) 05/02/2020   Brain tumor (HCC) 04/28/2020   S/P nasal septoplasty 03/19/2020   Special screening for malignant neoplasms, colon 01/09/2018    ONSET DATE: 04/03/2023  REFERRING DIAG: G82.50 (ICD-10-CM) - Tetraplegia (HCC) D32.9 (ICD-10-CM) - Meningioma (HCC)  THERAPY DIAG:  Other abnormalities of gait and mobility  Muscle weakness (generalized)  Abnormal posture  Other lack of coordination  Rationale for Evaluation and Treatment: Rehabilitation  SUBJECTIVE:                                                                                                                                                                                              SUBJECTIVE STATEMENT: Pt reports doing well today. Reports Anthony Downs hurt her hip over the weekend. Pt denies pain or falls. Has been working on sit to stands at home but no walking.   Pt accompanied by: self and friend [place]   PERTINENT HISTORY: anxiety, OA, cancer, subdural hematoma   PAIN:  Are you having pain? No  PRECAUTIONS: Fall  RED FLAGS: None   WEIGHT BEARING RESTRICTIONS: No  FALLS: Has patient fallen in last 6 months? No  LIVING ENVIRONMENT: Lives with: lives with their spouse Lives in: House/apartment Home is power wheelchair accessible Has following equipment at home: Wheelchair (power), Ramped entry, and slide board, [name], handicap accessible bathroom, hospital bed  PLOF: Independent with household mobility with device, Needs assistance with ADLs, and Needs assistance with transfers  PATIENT GOALS: "stand pivot transfer" "take steps in // bars"  OBJECTIVE:  Note: Objective measures were completed at Evaluation unless otherwise noted.  DIAGNOSTIC FINDINGS: None relevant to this POC  COGNITION: Overall cognitive status: Impaired   SENSATION: No N/T per patient report  EDEMA:  Yes in BLE, wears compression stockings  POSTURE: rounded shoulders, forward head, and posterior pelvic tilt  LOWER EXTREMITY ROM:     Passive  Right Eval Left Eval  Hip flexion Tight hip flexors Tight hip flexors  Hip extension    Hip abduction    Hip adduction    Hip internal rotation    Hip external rotation    Knee flexion    Knee extension Tight HS Tight HS  Ankle dorsiflexion Tight gastroc Tight gastroc  Ankle plantarflexion    Ankle inversion    Ankle eversion     (  Blank rows = not tested)  LOWER EXTREMITY MMT:    MMT Right Eval Left Eval  Hip flexion 2- 3  Hip  extension    Hip abduction    Hip adduction    Hip internal rotation    Hip external rotation    Knee flexion 2- 3  Knee extension 2- 3  Ankle dorsiflexion 0 1  Ankle plantarflexion    Ankle inversion    Ankle eversion    (Blank rows = not tested)  BED MOBILITY:  Sit to supine Mod A Supine to sit Min A Min A for trunk for supine to sit Mod A for BLE for sit to supine  TRANSFERS: Assistive device utilized: stedy  Sit to stand: SBA Stand to sit: SBA Chair to chair: dependent with use of stedy  VITALS  There were no vitals filed for this visit.                                                                                                                              TREATMENT:   NMR Pt performed sit to stand w/CGA from power chair and set up transfer independently.  At ballet bar for improved step clearance, lateral weight shifting, functional hip strength and single leg stability:  Side stepping over 4 various obstacles (wooden dowels and yard sticks) w/BUE support. Pt required min A to assist w/RLE and stabilizing object. Pt performed x1 rep down and back, requiring single seated rest break mid-task. Increased step clearance w/RLE when leading w/RLE, but difficulty clearing obstacle when leading w/LLE.  Pt performed sit to stand w/mod A x2 from clinic chair and required max verbal cues to push up to stand rather than pull. Pt then ambulated 7' w/RW prior to impulsively sitting down, stating he was too fatigued to continue. Continue to inform pt he must reach back for the chair prior to sitting and communicate when he needs to sit down rather than plop down impulsively. Pt verbalized understanding    PATIENT EDUCATION: Education details: Importance of working on repetitive sit to stands at home for endurance, safety w/transfers Person educated: Patient Education method: Programmer, multimedia, Facilities manager, and Verbal cues Education comprehension: verbalized understanding and needs  further education  HOME EXERCISE PROGRAM: To be initiated as appropriate  GOALS: Goals reviewed with patient? Yes  SHORT TERM GOALS: Target date: 05/05/2023  Pt will tolerate standing x 5 min with LRAD and CGA Baseline: x 2 min in stedy with CGA; x2 minutes w/RW and CGA (4/17) Goal status: IN PROGRESS    LONG TERM GOALS: Target date: 05/26/2023   Pt will tolerate standing x 10 min with LRAD and CGA Baseline: x 2 min in stedy with CGA  Goal status: NOT MET  2.  Pt will perform stand pivot transfer with LRAD and mod A Baseline: transfers with stedy; stand pivot w/CGA and RW (5/8) Goal status: MET  3.  Pt will ambulate x 5 ft in //  bars with assist x 2 for safety Baseline: able to take one step in // bars per family report; pt ambulating up to 52' w/min A and RW  Goal status: MET  NEW SHORT TERM GOALS:   Target date: 06/30/2023  Pt will perform stand pivots consistently w/RW and CGA x1 for improved independence and functional mobility  Baseline: CGA-min A x2  Goal status: INITIAL  2.  5x STS to be assessed at STG assessment and LTG written  Baseline:  Goal status: INITIAL  3.  Pt will ambulate 69' w/RW and min A x1 for improved endurance and functional mobility  Baseline: 64' w/CGA-min A x2 Goal status: INITIAL   NEW LONG TERM GOALS:  Target date: 07/28/2023  5x STS goal  Baseline:  Goal status: INITIAL  2.  Pt will ambulate 44' w/RW and CGA x1 for improved independence and endurance  Baseline: 19' w/CGA-min A x2  Goal status: INITIAL    ASSESSMENT:  CLINICAL IMPRESSION: Emphasis of skilled PT session on hip abductor strength, increased step clearance in lateral direction and endurance. Pt very fatigued today and reports he has not done much this weekend other than sit in recliner due to [place] being injured. Pt continues to require mod multimodal cues to fully reach back for chair prior to sitting and to avoid plopping down despite pt being educated multiple times  on proper technique. Pt reports plopping is "easier". Encouraged pt to work on repetitive sit to stands at home to improve endurance and maintain gains made in PT.  Continue POC.    OBJECTIVE IMPAIRMENTS: decreased activity tolerance, decreased balance, decreased cognition, decreased endurance, decreased knowledge of condition, decreased knowledge of use of DME, decreased mobility, difficulty walking, decreased ROM, decreased strength, increased edema, and impaired UE functional use.   ACTIVITY LIMITATIONS: carrying, lifting, bending, standing, transfers, and bed mobility  PARTICIPATION LIMITATIONS: meal prep, cleaning, laundry, and community activity  PERSONAL FACTORS: Fitness, Time since onset of injury/illness/exacerbation, and 1-2 comorbidities:   anxiety, OA, cancer, subdural hematoma are also affecting patient's functional outcome.   REHAB POTENTIAL: Good  CLINICAL DECISION MAKING: Stable/uncomplicated  EVALUATION COMPLEXITY: High  PLAN:  PT FREQUENCY: 1-2x/week  PT DURATION: 6 weeks + 8 weeks (recert)  PLANNED INTERVENTIONS: 32440- PT Re-evaluation, 97110-Therapeutic exercises, 97530- Therapeutic activity, 97112- Neuromuscular re-education, 97535- Self Care, 10272- Manual therapy, [phone]- Gait training, [phone]- Orthotic Fit/training, [phone]- Electrical stimulation (manual), Patient/Family education, Balance training, Taping, Dry Needling, Joint mobilization, Cognitive remediation, DME instructions, Wheelchair mobility training, Cryotherapy, and Moist heat  PLAN FOR NEXT SESSION: Work on Oncologist, retro gait, continues gait training w/RW (w/shoe cover on RLE). Sit to stands. EMOM training, independence w/transfers    Rilea Arutyunyan E Armani Gawlik, PT, DPT  06/21/2023, 2:01 PM

## 2023-06-23 ENCOUNTER — Ambulatory Visit: Admitting: Physical Therapy

## 2023-06-23 DIAGNOSIS — M6281 Muscle weakness (generalized): Secondary | ICD-10-CM

## 2023-06-23 DIAGNOSIS — R2689 Other abnormalities of gait and mobility: Secondary | ICD-10-CM

## 2023-06-23 DIAGNOSIS — R293 Abnormal posture: Secondary | ICD-10-CM

## 2023-06-23 DIAGNOSIS — R29818 Other symptoms and signs involving the nervous system: Secondary | ICD-10-CM

## 2023-06-23 DIAGNOSIS — R2681 Unsteadiness on feet: Secondary | ICD-10-CM

## 2023-06-23 DIAGNOSIS — R29898 Other symptoms and signs involving the musculoskeletal system: Secondary | ICD-10-CM

## 2023-06-23 NOTE — Therapy (Signed)
 OUTPATIENT PHYSICAL THERAPY NEURO TREATMENT   Patient Name: Anthony Downs MRN: [id number] DOB:19-Apr-1950, 73 y.o., male Today's Date: 06/23/2023   PCP: Kathyleen Parkins, MD REFERRING PROVIDER: Rawland Caddy, MD    END OF SESSION:  PT End of Session - 06/23/23 1316     Visit Number 17    Number of Visits 29   with eval   Date for PT Re-Evaluation 07/28/23   recert   Authorization Type Medicare    PT Start Time 1315    PT Stop Time 1400    PT Time Calculation (min) 45 min    Equipment Utilized During Treatment Gait belt    Activity Tolerance Patient limited by fatigue    Behavior During Therapy WFL for tasks assessed/performed                 Past Medical History:  Diagnosis Date   Acid reflux    Anxiety    Arthritis    Asthma    as a child   Basal cell carcinoma 01/26/2021   RIGHT POST SURICULAR INFERIOR   Basal cell carcinoma 01/26/2021   RIGHT POST AURICULAR SUPERIOR   Benign brain tumor (HCC)    Chest pain    a. 09/2019 MV: No ischemia.   Complication of anesthesia    slow to wake up and pain medicines make him sick   GERD (gastroesophageal reflux disease)    History of DVT (deep vein thrombosis)    History of echocardiogram    a. 09/2019 Echo: EF 60-65%, nl RV fxn; b. 09/2022 Echo: EF of 60-65% without regional wall motion abnormalities, normal RV size/function, and borderline dilatation of the aortic root at 39 mm.   Hypercholesteremia    Hypertension    PONV (postoperative nausea and vomiting)    PVC's (premature ventricular contractions)    a. 08/2022 Zio: predominantly sinus rhythm with an average heart rate of 71 (50-150), 5 brief SVT runs (longest 10.2 seconds with max rate of 150).  3.2% PVC burden was also noted.  No triggered events.   Skin cancer    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, squamous behind ear   Sleep apnea    no Cpap   Subdural hematoma, post-traumatic (HCC) 2008   fell off truck hit head on concrete   Past Surgical  History:  Procedure Laterality Date   APPLICATION OF CRANIAL NAVIGATION Left 04/29/2020   Procedure: APPLICATION OF CRANIAL NAVIGATION;  Surgeon: Cannon Champion, MD;  Location: [facility] OR;  Service: Neurosurgery;  Laterality: Left;   arthroscopic knee     COLONOSCOPY N/A 11/29/2018   Procedure: COLONOSCOPY;  Surgeon: Ruby Corporal, MD;  Location: [facility];  Service: Endoscopy;  Laterality: N/A;  730-rescheduled 9/2 same time per Ann   CRANIOTOMY Left 04/29/2020   Procedure: LEFT CRANIECTOMY WITH TUMOR EXCISION;  Surgeon: Cannon Champion, MD;  Location: [facility] OR;  Service: Neurosurgery;  Laterality: Left;   KNEE SURGERY Right    NASAL SEPTOPLASTY W/ TURBINOPLASTY Bilateral 03/19/2020   Procedure: NASAL SEPTOPLASTY WITH BILATERAL  TURBINATE REDUCTION;  Surgeon: Reynold Caves, MD;  Location: [facility] OR;  Service: ENT;  Laterality: Bilateral;   VASECTOMY     Patient Active Problem List   Diagnosis Date Noted   Current use of long term anticoagulation 05/06/2022   Seizure (HCC) 08/13/2020   Seizures (HCC) 08/12/2020   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   DVT (deep venous thrombosis) (HCC) 08/12/2020   COVID-19 virus infection 08/12/2020  Acute lower UTI 07/14/2020   Delirium 07/06/2020   Sleep apnea 07/06/2020   GERD (gastroesophageal reflux disease) 07/06/2020   Tetraplegia (HCC) 06/25/2020   Sundowning 06/25/2020   Slow transit constipation    Essential hypertension    Hypokalemia    Postoperative pain    Meningioma (HCC) 05/02/2020   Brain tumor (HCC) 04/28/2020   S/P nasal septoplasty 03/19/2020   Special screening for malignant neoplasms, colon 01/09/2018    ONSET DATE: 04/03/2023  REFERRING DIAG: G82.50 (ICD-10-CM) - Tetraplegia (HCC) D32.9 (ICD-10-CM) - Meningioma (HCC)  THERAPY DIAG:  Other abnormalities of gait and mobility  Muscle weakness (generalized)  Abnormal posture  Unsteadiness on feet  Other symptoms and signs involving the musculoskeletal  system  Other symptoms and signs involving the nervous system  Rationale for Evaluation and Treatment: Rehabilitation  SUBJECTIVE:                                                                                                                                                                                             SUBJECTIVE STATEMENT: Pt denies pain or falls. [name] helped him with his legs this morning and he was able to initiate the movement of moving his leg out to the side (felt muscle contraction) but not actually move his leg to the side.  Pt has been working on sit to stands at home - family does remind him to push up from arm of chair.  [name]'s hip is still hurting, going to reach out to orthopedist, using a SPC today.  Pt accompanied by: self and significant other [name]  PERTINENT HISTORY: anxiety, OA, cancer, subdural hematoma   PAIN:  Are you having pain? No  PRECAUTIONS: Fall  RED FLAGS: None   WEIGHT BEARING RESTRICTIONS: No  FALLS: Has patient fallen in last 6 months? No  LIVING ENVIRONMENT: Lives with: lives with their spouse Lives in: House/apartment Home is power wheelchair accessible Has following equipment at home: Wheelchair (power), Ramped entry, and slide board, [name], handicap accessible bathroom, hospital bed  PLOF: Independent with household mobility with device, Needs assistance with ADLs, and Needs assistance with transfers  PATIENT GOALS: "stand pivot transfer" "take steps in // bars"  OBJECTIVE:  Note: Objective measures were completed at Evaluation unless otherwise noted.  DIAGNOSTIC FINDINGS: None relevant to this POC  COGNITION: Overall cognitive status: Impaired   SENSATION: No N/T per patient report  EDEMA:  Yes in BLE, wears compression stockings  POSTURE: rounded shoulders, forward head, and posterior pelvic tilt  LOWER EXTREMITY ROM:     Passive  Right Eval Left Eval  Hip flexion Tight hip flexors  Tight hip  flexors  Hip extension    Hip abduction    Hip adduction    Hip internal rotation    Hip external rotation    Knee flexion    Knee extension Tight HS Tight HS  Ankle dorsiflexion Tight gastroc Tight gastroc  Ankle plantarflexion    Ankle inversion    Ankle eversion     (Blank rows = not tested)  LOWER EXTREMITY MMT:    MMT Right Eval Left Eval  Hip flexion 2- 3  Hip extension    Hip abduction    Hip adduction    Hip internal rotation    Hip external rotation    Knee flexion 2- 3  Knee extension 2- 3  Ankle dorsiflexion 0 1  Ankle plantarflexion    Ankle inversion    Ankle eversion    (Blank rows = not tested)  BED MOBILITY:  Sit to supine Mod A Supine to sit Min A Min A for trunk for supine to sit Mod A for BLE for sit to supine  TRANSFERS: Assistive device utilized: stedy  Sit to stand: SBA Stand to sit: SBA Chair to chair: dependent with use of stedy  VITALS  There were no vitals filed for this visit.                                                                                                                              TREATMENT:   NMR Pt received seated in his power wheelchair. EMOM x 5 min with focus on sit to stands, gait, and seated rest breaks during time: Sit to stands from [facility] to RW with CGA, pt does push up from arm of PWC to stand. Ambulation x 26 ft, x 32 ft, x 40, x 20 ft with RW and CGA for balance with PWC follow for safety. Pt continues to exhibit flexed trunk, narrow BOS, scissoring gait. He exhibits decreased step length with his RLE with onset of fatigue. Stand to sit with pt indicating "sit" when he needs to sit and reaching back for arm of wheelchair prior to sitting for first two rounds, does not reach back for arm of chair with last 2 rounds but does indicate his need to sit Re-iterated with patient and with [name] importance of patient following sequencing and cues to stand safely to prevent future falls and/or caregiver  injury   PATIENT EDUCATION: Education details: Importance of working on repetitive sit to stands at home for endurance, safety w/transfers Person educated: Patient, Designer, multimedia Education method: Explanation, Demonstration, and Verbal cues Education comprehension: verbalized understanding and needs further education  HOME EXERCISE PROGRAM: To be initiated as appropriate  GOALS: Goals reviewed with patient? Yes  SHORT TERM GOALS: Target date: 05/05/2023  Pt will tolerate standing x 5 min with LRAD and CGA Baseline: x 2 min in stedy with CGA; x2 minutes w/RW and CGA (4/17) Goal status: IN PROGRESS    LONG TERM GOALS:  Target date: 05/26/2023   Pt will tolerate standing x 10 min with LRAD and CGA Baseline: x 2 min in stedy with CGA  Goal status: NOT MET  2.  Pt will perform stand pivot transfer with LRAD and mod A Baseline: transfers with stedy; stand pivot w/CGA and RW (5/8) Goal status: MET  3.  Pt will ambulate x 5 ft in // bars with assist x 2 for safety Baseline: able to take one step in // bars per family report; pt ambulating up to 15' w/min A and RW  Goal status: MET  NEW SHORT TERM GOALS:   Target date: 06/30/2023  Pt will perform stand pivots consistently w/RW and CGA x1 for improved independence and functional mobility  Baseline: CGA-min A x2  Goal status: INITIAL  2.  5x STS to be assessed at STG assessment and LTG written  Baseline:  Goal status: INITIAL  3.  Pt will ambulate 74' w/RW and min A x1 for improved endurance and functional mobility  Baseline: 70' w/CGA-min A x2 Goal status: INITIAL   NEW LONG TERM GOALS:  Target date: 07/28/2023  5x STS goal  Baseline:  Goal status: INITIAL  2.  Pt will ambulate 35' w/RW and CGA x1 for improved independence and endurance  Baseline: 75' w/CGA-min A x2  Goal status: INITIAL    ASSESSMENT:  CLINICAL IMPRESSION: Emphasis of skilled PT session on continuing to work on functional endurance training with EMOM for  sit to stands, gait, and stand to sits with rest breaks. Pt continues to require min cueing for safety (turning of his PWC, pushing up from armrest of chair, reaching back for armrest of chair, telling caregivers when he needs to sit, etc). He and [name] seem to exhibit a better understanding of the importance of repetition and patient being able to safely sequence transfers and gait training to prevent injury to himself or to [place]. He continues to do well managing his RLE with decreased step length noted with onset of fatigue. Pt continues to benefit from skilled PT services to work towards increased safety and independence with functional mobility and decreased caregiver burden. Continue POC.    OBJECTIVE IMPAIRMENTS: decreased activity tolerance, decreased balance, decreased cognition, decreased endurance, decreased knowledge of condition, decreased knowledge of use of DME, decreased mobility, difficulty walking, decreased ROM, decreased strength, increased edema, and impaired UE functional use.   ACTIVITY LIMITATIONS: carrying, lifting, bending, standing, transfers, and bed mobility  PARTICIPATION LIMITATIONS: meal prep, cleaning, laundry, and community activity  PERSONAL FACTORS: Fitness, Time since onset of injury/illness/exacerbation, and 1-2 comorbidities:   anxiety, OA, cancer, subdural hematoma are also affecting patient's functional outcome.   REHAB POTENTIAL: Good  CLINICAL DECISION MAKING: Stable/uncomplicated  EVALUATION COMPLEXITY: High  PLAN:  PT FREQUENCY: 1-2x/week  PT DURATION: 6 weeks + 8 weeks (recert)  PLANNED INTERVENTIONS: 16109- PT Re-evaluation, 97110-Therapeutic exercises, 97530- Therapeutic activity, 97112- Neuromuscular re-education, 97535- Self Care, 60454- Manual therapy, [phone]- Gait training, [phone]- Orthotic Fit/training, [phone]- Electrical stimulation (manual), Patient/Family education, Balance training, Taping, Dry Needling, Joint mobilization, Cognitive  remediation, DME instructions, Wheelchair mobility training, Cryotherapy, and Moist heat  PLAN FOR NEXT SESSION: Work on Oncologist, retro gait, continues gait training w/RW (w/shoe cover on RLE). Sit to stands-from lower surfaces. EMOM training, independence w/transfers, step taps, lateral stepping at ballet bar again?    Aury Scollard, PT Lorita Rosa, PT, DPT, CSRS   06/23/2023, 2:00 PM

## 2023-06-28 ENCOUNTER — Ambulatory Visit: Admitting: Physical Therapy

## 2023-06-28 DIAGNOSIS — M6281 Muscle weakness (generalized): Secondary | ICD-10-CM

## 2023-06-28 DIAGNOSIS — R2689 Other abnormalities of gait and mobility: Secondary | ICD-10-CM | POA: Diagnosis not present

## 2023-06-28 DIAGNOSIS — R2681 Unsteadiness on feet: Secondary | ICD-10-CM

## 2023-06-28 DIAGNOSIS — R293 Abnormal posture: Secondary | ICD-10-CM

## 2023-06-28 NOTE — Therapy (Signed)
 OUTPATIENT PHYSICAL THERAPY NEURO TREATMENT   Patient Name: Anthony Downs MRN: [id number] DOB:[birth year], 73 y.o., male Today's Date: 06/28/2023   PCP: Anthony Parkins, MD REFERRING PROVIDER: Rawland Caddy, MD    END OF SESSION:  PT End of Session - 06/28/23 1319     Visit Number 18    Number of Visits 29   with eval   Date for PT Re-Evaluation 07/28/23   recert   Authorization Type Medicare    PT Start Time 1318    PT Stop Time 1359    PT Time Calculation (min) 41 min    Equipment Utilized During Treatment Gait belt    Activity Tolerance Patient limited by fatigue    Behavior During Therapy WFL for tasks assessed/performed                  Past Medical History:  Diagnosis Date   Acid reflux    Anxiety    Arthritis    Asthma    as a child   Basal cell carcinoma 01/26/2021   RIGHT POST SURICULAR INFERIOR   Basal cell carcinoma 01/26/2021   RIGHT POST AURICULAR SUPERIOR   Benign brain tumor (HCC)    Chest pain    a. 09/2019 MV: No ischemia.   Complication of anesthesia    slow to wake up and pain medicines make him sick   GERD (gastroesophageal reflux disease)    History of DVT (deep vein thrombosis)    History of echocardiogram    a. 09/2019 Echo: EF 60-65%, nl RV fxn; b. 09/2022 Echo: EF of 60-65% without regional wall motion abnormalities, normal RV size/function, and borderline dilatation of the aortic root at 39 mm.   Hypercholesteremia    Hypertension    PONV (postoperative nausea and vomiting)    PVC's (premature ventricular contractions)    a. 08/2022 Zio: predominantly sinus rhythm with an average heart rate of 71 (50-150), 5 brief SVT runs (longest 10.2 seconds with max rate of 150).  3.2% PVC burden was also noted.  No triggered events.   Skin cancer    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, squamous behind ear   Sleep apnea    no Cpap   Subdural hematoma, post-traumatic (HCC) 2008   fell off truck hit head on concrete   Past Surgical  History:  Procedure Laterality Date   APPLICATION OF CRANIAL NAVIGATION Left 04/29/2020   Procedure: APPLICATION OF CRANIAL NAVIGATION;  Surgeon: Anthony Champion, MD;  Location: [facility] OR;  Service: Neurosurgery;  Laterality: Left;   arthroscopic knee     COLONOSCOPY N/A 11/29/2018   Procedure: COLONOSCOPY;  Surgeon: Anthony Corporal, MD;  Location: [facility];  Service: Endoscopy;  Laterality: N/A;  730-rescheduled 9/2 same time per Ann   CRANIOTOMY Left 04/29/2020   Procedure: LEFT CRANIECTOMY WITH TUMOR EXCISION;  Surgeon: Anthony Champion, MD;  Location: [facility] OR;  Service: Neurosurgery;  Laterality: Left;   KNEE SURGERY Right    NASAL SEPTOPLASTY W/ TURBINOPLASTY Bilateral 03/19/2020   Procedure: NASAL SEPTOPLASTY WITH BILATERAL  TURBINATE REDUCTION;  Surgeon: Anthony Caves, MD;  Location: [facility] OR;  Service: ENT;  Laterality: Bilateral;   VASECTOMY     Patient Active Problem List   Diagnosis Date Noted   Current use of long term anticoagulation 05/06/2022   Seizure (HCC) 08/13/2020   Seizures (HCC) 08/12/2020   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   DVT (deep venous thrombosis) (HCC) 08/12/2020   COVID-19 virus infection 08/12/2020  Acute lower UTI 07/14/2020   Delirium 07/06/2020   Sleep apnea 07/06/2020   GERD (gastroesophageal reflux disease) 07/06/2020   Tetraplegia (HCC) 06/25/2020   Sundowning 06/25/2020   Slow transit constipation    Essential hypertension    Hypokalemia    Postoperative pain    Meningioma (HCC) 05/02/2020   Brain tumor (HCC) 04/28/2020   S/P nasal septoplasty 03/19/2020   Special screening for malignant neoplasms, colon 01/09/2018    ONSET DATE: 04/03/2023  REFERRING DIAG: G82.50 (ICD-10-CM) - Tetraplegia (HCC) D32.9 (ICD-10-CM) - Meningioma (HCC)  THERAPY DIAG:  Other abnormalities of gait and mobility  Muscle weakness (generalized)  Abnormal posture  Unsteadiness on feet  Rationale for Evaluation and Treatment:  Rehabilitation  SUBJECTIVE:                                                                                                                                                                                             SUBJECTIVE STATEMENT: Pt denies pain or falls. Has been working on standing and reaching back for the chair at home. Anthony Downs inquiring if they can work on stand pivot transfers at home.    Anthony Downs states she has not been helping pt at all with transfers and he has been practicing moving his legs more. Still has difficulty remembering to announce when he needs to sit and reaching back for chair.   Pt accompanied by: self and significant other Anthony Downs  PERTINENT HISTORY: anxiety, OA, cancer, subdural hematoma   PAIN:  Are you having pain? No  PRECAUTIONS: Fall  RED FLAGS: None   WEIGHT BEARING RESTRICTIONS: No  FALLS: Has patient fallen in last 6 months? No  LIVING ENVIRONMENT: Lives with: lives with their spouse Lives in: House/apartment Home is power wheelchair accessible Has following equipment at home: Wheelchair (power), Ramped entry, and slide board, Anthony Downs, handicap accessible bathroom, hospital bed  PLOF: Independent with household mobility with device, Needs assistance with ADLs, and Needs assistance with transfers  PATIENT GOALS: "stand pivot transfer" "take steps in // bars"  OBJECTIVE:  Note: Objective measures were completed at Evaluation unless otherwise noted.  DIAGNOSTIC FINDINGS: None relevant to this POC  COGNITION: Overall cognitive status: Impaired   SENSATION: No N/T per patient report  EDEMA:  Yes in BLE, wears compression stockings  POSTURE: rounded shoulders, forward head, and posterior pelvic tilt  LOWER EXTREMITY ROM:     Passive  Right Eval Left Eval  Hip flexion Tight hip flexors Tight hip flexors  Hip extension    Hip abduction    Hip adduction    Hip internal rotation    Hip external  rotation    Knee flexion    Knee  extension Tight HS Tight HS  Ankle dorsiflexion Tight gastroc Tight gastroc  Ankle plantarflexion    Ankle inversion    Ankle eversion     (Blank rows = not tested)  LOWER EXTREMITY MMT:    MMT Right Eval Left Eval  Hip flexion 2- 3  Hip extension    Hip abduction    Hip adduction    Hip internal rotation    Hip external rotation    Knee flexion 2- 3  Knee extension 2- 3  Ankle dorsiflexion 0 1  Ankle plantarflexion    Ankle inversion    Ankle eversion    (Blank rows = not tested)  BED MOBILITY:  Sit to supine Mod A Supine to sit Min A Min A for trunk for supine to sit Mod A for BLE for sit to supine  TRANSFERS: Assistive device utilized: stedy  Sit to stand: SBA Stand to sit: SBA Chair to chair: dependent with use of stedy  VITALS  There were no vitals filed for this visit.                                                                                                                              TREATMENT:   NMR Pt received seated in his power wheelchair. Pt performed sit > stand pivot transfer from power chair to mat table w/bariatric RW and min cues for proper foot placement, scooting hips towards edge of seat and anterior weight shift. Once standing, pt attempted to sit on mat mid-turn, requiring max cues to avoid premature sit. With cues, pt able to fully turn and reach to mat prior to sitting. When asked why pt attempted to sit too early, pt reports he "thought he was there". Continue to inform pt on importance of fully turning and touching chair w/back of legs as well as reaching for seat prior to sitting.  Pt performed second sit to stand transfer from mat table to power chair w/SBA only and RW. Pt did require min A to scoot hips towards edge of mat due to legs sticking on mat. Pt unable to stand from lowest mat height, so waited for pt to ask for mat to be elevated but pt did not, so provided min encouragement to ask for this.  Pt performed second sit to  stand pivot to and from power chair/mat table w/ RW and SBA. No verbal cues required for technique. 2 minute rest break provided between reps. Pt demonstrated good reach back for chair both times and was able to manage RLE independently as well as scoot to edge of seat.    Gait pattern: step to pattern, step through pattern, decreased step length- Right, decreased stride length, decreased hip/knee flexion- Right, decreased ankle dorsiflexion- Right, decreased ankle dorsiflexion- Left, lateral hip instability, trunk flexed, narrow BOS, and poor foot clearance- Right Distance walked: 16' Assistive device utilized: Environmental consultant -  2 wheeled Level of assistance: SBA w/power WC follow  Comments: Cued pt to walk as far as he could prior to sitting. Pt fatigued very quickly and did not announce when he needed to sit, resulting in sitting down prior to power chair being turned off. Pt states he knows he needs to do this yet continues to demonstrate noncompliance.     PATIENT EDUCATION: Education details: Clearance to work on transfers at home Person educated: Patient, Anthony Downs Education method: Explanation, Demonstration, and Verbal cues Education comprehension: verbalized understanding and needs further education  HOME EXERCISE PROGRAM: To be initiated as appropriate  GOALS: Goals reviewed with patient? Yes  SHORT TERM GOALS: Target date: 05/05/2023  Pt will tolerate standing x 5 min with LRAD and CGA Baseline: x 2 min in stedy with CGA; x2 minutes w/RW and CGA (4/17) Goal status: IN PROGRESS    LONG TERM GOALS: Target date: 05/26/2023   Pt will tolerate standing x 10 min with LRAD and CGA Baseline: x 2 min in stedy with CGA  Goal status: NOT MET  2.  Pt will perform stand pivot transfer with LRAD and mod A Baseline: transfers with stedy; stand pivot w/CGA and RW (5/8) Goal status: MET  3.  Pt will ambulate x 5 ft in // bars with assist x 2 for safety Baseline: able to take one step in // bars  per family report; pt ambulating up to 41' w/min A and RW  Goal status: MET  NEW SHORT TERM GOALS:   Target date: 06/30/2023  Pt will perform stand pivots consistently w/RW and CGA x1 for improved independence and functional mobility  Baseline: CGA-min A x2  Goal status: INITIAL  2.  5x STS to be assessed at STG assessment and LTG written  Baseline:  Goal status: INITIAL  3.  Pt will ambulate 75' w/RW and min A x1 for improved endurance and functional mobility  Baseline: 32' w/CGA-min A x2 Goal status: INITIAL   NEW LONG TERM GOALS:  Target date: 07/28/2023  5x STS goal  Baseline:  Goal status: INITIAL  2.  Pt will ambulate 78' w/RW and CGA x1 for improved independence and endurance  Baseline: 35' w/CGA-min A x2  Goal status: INITIAL    ASSESSMENT:  CLINICAL IMPRESSION: Emphasis of skilled PT session on stand pivot transfers, independence w/transfers and endurance. Pt continues to demonstrate noncompliance w/reaching back for chair or announcing when he needs to sit prior to sitting despite repeated education, requiring cues from therapist and Anthony Downs. With cues applied, pt able to perform transfer w/SBA only, but pt has poor retention of education provided each session thus far. Provided clearance for Anthony Downs and pt to practice stand pivot transfers at home but informed pt he must reach back for chair and verbalize when he needs to sit down. Pt verbalized understanding. Continue POC.    OBJECTIVE IMPAIRMENTS: decreased activity tolerance, decreased balance, decreased cognition, decreased endurance, decreased knowledge of condition, decreased knowledge of use of DME, decreased mobility, difficulty walking, decreased ROM, decreased strength, increased edema, and impaired UE functional use.   ACTIVITY LIMITATIONS: carrying, lifting, bending, standing, transfers, and bed mobility  PARTICIPATION LIMITATIONS: meal prep, cleaning, laundry, and community activity  PERSONAL FACTORS:  Fitness, Time since onset of injury/illness/exacerbation, and 1-2 comorbidities:   anxiety, OA, cancer, subdural hematoma are also affecting patient's functional outcome.   REHAB POTENTIAL: Good  CLINICAL DECISION MAKING: Stable/uncomplicated  EVALUATION COMPLEXITY: High  PLAN:  PT FREQUENCY: 1-2x/week  PT DURATION: 6 weeks +  8 weeks (recert)  PLANNED INTERVENTIONS: 16109- PT Re-evaluation, 97110-Therapeutic exercises, 97530- Therapeutic activity, [id number]- Neuromuscular re-education, 97535- Self Care, [id number]- Manual therapy, [phone]- Gait training, [phone]- Orthotic Fit/training, [phone]- Electrical stimulation (manual), Patient/Family education, Balance training, Taping, Dry Needling, Joint mobilization, Cognitive remediation, DME instructions, Wheelchair mobility training, Cryotherapy, and Moist heat  PLAN FOR NEXT SESSION: Goals. Work on Oncologist, retro gait, continues gait training w/RW (w/shoe cover on RLE). Sit to stands-from lower surfaces. EMOM training, independence w/transfers, step taps, lateral stepping at ballet bar again?    Rollie Hynek E Suriyah Vergara, PT DPT   06/28/2023, 1:59 PM

## 2023-06-30 ENCOUNTER — Ambulatory Visit: Admitting: Physical Therapy

## 2023-06-30 DIAGNOSIS — R2689 Other abnormalities of gait and mobility: Secondary | ICD-10-CM | POA: Diagnosis not present

## 2023-06-30 DIAGNOSIS — R293 Abnormal posture: Secondary | ICD-10-CM

## 2023-06-30 DIAGNOSIS — R2681 Unsteadiness on feet: Secondary | ICD-10-CM

## 2023-06-30 DIAGNOSIS — M6281 Muscle weakness (generalized): Secondary | ICD-10-CM

## 2023-06-30 NOTE — Therapy (Signed)
 OUTPATIENT PHYSICAL THERAPY NEURO TREATMENT   Patient Name: Anthony Downs MRN: [id number] DOB:September 12, 1950, 73 y.o., male Today's Date: 06/30/2023   PCP: Kathyleen Parkins, MD REFERRING PROVIDER: Rawland Caddy, MD    END OF SESSION:  PT End of Session - 06/30/23 1402     Visit Number 19    Number of Visits 29   with eval   Date for PT Re-Evaluation 07/28/23   recert   Authorization Type Medicare    PT Start Time 1400    PT Stop Time 1447    PT Time Calculation (min) 47 min    Equipment Utilized During Treatment Gait belt    Activity Tolerance Patient limited by fatigue    Behavior During Therapy WFL for tasks assessed/performed                Past Medical History:  Diagnosis Date   Acid reflux    Anxiety    Arthritis    Asthma    as a child   Basal cell carcinoma 01/26/2021   RIGHT POST SURICULAR INFERIOR   Basal cell carcinoma 01/26/2021   RIGHT POST AURICULAR SUPERIOR   Benign brain tumor (HCC)    Chest pain    a. 09/2019 MV: No ischemia.   Complication of anesthesia    slow to wake up and pain medicines make him sick   GERD (gastroesophageal reflux disease)    History of DVT (deep vein thrombosis)    History of echocardiogram    a. 09/2019 Echo: EF 60-65%, nl RV fxn; b. 09/2022 Echo: EF of 60-65% without regional wall motion abnormalities, normal RV size/function, and borderline dilatation of the aortic root at 39 mm.   Hypercholesteremia    Hypertension    PONV (postoperative nausea and vomiting)    PVC's (premature ventricular contractions)    a. 08/2022 Zio: predominantly sinus rhythm with an average heart rate of 71 (50-150), 5 brief SVT runs (longest 10.2 seconds with max rate of 150).  3.2% PVC burden was also noted.  No triggered events.   Skin cancer    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, squamous behind ear   Sleep apnea    no Cpap   Subdural hematoma, post-traumatic (HCC) 2008   fell off truck hit head on concrete   Past Surgical  History:  Procedure Laterality Date   APPLICATION OF CRANIAL NAVIGATION Left 04/29/2020   Procedure: APPLICATION OF CRANIAL NAVIGATION;  Surgeon: Cannon Champion, MD;  Location: [facility] OR;  Service: Neurosurgery;  Laterality: Left;   arthroscopic knee     COLONOSCOPY N/A 11/29/2018   Procedure: COLONOSCOPY;  Surgeon: Ruby Corporal, MD;  Location: [facility];  Service: Endoscopy;  Laterality: N/A;  730-rescheduled 9/2 same time per Ann   CRANIOTOMY Left 04/29/2020   Procedure: LEFT CRANIECTOMY WITH TUMOR EXCISION;  Surgeon: Cannon Champion, MD;  Location: 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   DVT (deep venous thrombosis) (HCC) 08/12/2020   COVID-19 virus infection 08/12/2020  Acute lower UTI 07/14/2020   Delirium 07/06/2020   Sleep apnea 07/06/2020   GERD (gastroesophageal reflux disease) 07/06/2020   Tetraplegia (HCC) 06/25/2020   Sundowning 06/25/2020   Slow transit constipation    Essential hypertension    Hypokalemia    Postoperative pain    Meningioma (HCC) 05/02/2020   Brain tumor (HCC) 04/28/2020   S/P nasal septoplasty 03/19/2020   Special screening for malignant neoplasms, colon 01/09/2018    ONSET DATE: 04/03/2023  REFERRING DIAG: G82.50 (ICD-10-CM) - Tetraplegia (HCC) D32.9 (ICD-10-CM) - Meningioma (HCC)  THERAPY DIAG:  Other abnormalities of gait and mobility  Muscle weakness (generalized)  Abnormal posture  Unsteadiness on feet  Rationale for Evaluation and Treatment:  Rehabilitation  SUBJECTIVE:                                                                                                                                                                                             SUBJECTIVE STATEMENT:  Pt has been transferring from his lift chair to/from his PWC with his RW at home, only using the stedy to transfer from the bed and to go into the bathroom. Pt did have a near fall because [name] forgot to turn off his power wheelchair before he sat down during a transfer.  Pt accompanied by: self and significant other [name]  PERTINENT HISTORY: anxiety, OA, cancer, subdural hematoma   PAIN:  Are you having pain? No  PRECAUTIONS: Fall  RED FLAGS: None   WEIGHT BEARING RESTRICTIONS: No  FALLS: Has patient fallen in last 6 months? No  LIVING ENVIRONMENT: Lives with: lives with their spouse Lives in: House/apartment Home is power wheelchair accessible Has following equipment at home: Wheelchair (power), Ramped entry, and slide board, [name], handicap accessible bathroom, hospital bed  PLOF: Independent with household mobility with device, Needs assistance with ADLs, and Needs assistance with transfers  PATIENT GOALS: stand pivot transfer take steps in // bars  OBJECTIVE:  Note: Objective measures were completed at Evaluation unless otherwise noted.  DIAGNOSTIC FINDINGS: None relevant to this POC  COGNITION: Overall cognitive status: Impaired   SENSATION: No N/T per patient report  EDEMA:  Yes in BLE, wears compression stockings  POSTURE: rounded shoulders, forward head, and posterior pelvic tilt  LOWER EXTREMITY ROM:     Passive  Right Eval Left Eval  Hip flexion Tight hip flexors Tight hip flexors  Hip extension    Hip abduction    Hip adduction    Hip internal rotation    Hip external rotation    Knee flexion    Knee extension Tight HS Tight HS  Ankle  dorsiflexion Tight gastroc Tight gastroc  Ankle plantarflexion     Ankle inversion    Ankle eversion     (Blank rows = not tested)  LOWER EXTREMITY MMT:    MMT Right Eval Left Eval  Hip flexion 2- 3  Hip extension    Hip abduction    Hip adduction    Hip internal rotation    Hip external rotation    Knee flexion 2- 3  Knee extension 2- 3  Ankle dorsiflexion 0 1  Ankle plantarflexion    Ankle inversion    Ankle eversion    (Blank rows = not tested)  BED MOBILITY:  Sit to supine Mod A Supine to sit Min A Min A for trunk for supine to sit Mod A for BLE for sit to supine  TRANSFERS: Assistive device utilized: stedy  Sit to stand: SBA Stand to sit: SBA Chair to chair: dependent with use of stedy  VITALS  There were no vitals filed for this visit.                                                                                                                              TREATMENT:   NMR Pt received seated in his power wheelchair. Pt performed sit > stand from power chair to bariatric RW with min cues for proper foot placement and safe setup of transfer For STG assessment: 5xSTS: 2:25 min  Gait pattern: step to pattern, step through pattern, decreased step length- Right, decreased stride length, decreased hip/knee flexion- Right, decreased ankle dorsiflexion- Right, decreased ankle dorsiflexion- Left, lateral hip instability, trunk flexed, narrow BOS, and poor foot clearance- Right Distance walked: 38 ft, 17 ft, 19 ft, 11 ft Assistive device utilized: Walker - 2 wheeled Level of assistance: SBA w/power WC follow  Comments: Cued pt to walk as far as he could prior to sitting with STG of 40 ft. Pt unable to meet goal of 40 feet, closest he gets is 38 ft. Pt does indicate his need to sit before sitting each time, however he does not reach back for his chair with his L arm for first two sits, after max verbal cueing he does reach back for last 2 rounds of ambulation. Pt does exhibit decreased RLE step length and limb clearance with onset  of fatigue, especially when turning around corners.    PATIENT EDUCATION: Education details: Continue to work on stand pivot transfers at home with RW Person educated: Patient, [name] Education method: Explanation, Demonstration, and Verbal cues Education comprehension: verbalized understanding and needs further education  HOME EXERCISE PROGRAM: To be initiated as appropriate  GOALS: Goals reviewed with patient? Yes  SHORT TERM GOALS: Target date: 05/05/2023  Pt will tolerate standing x 5 min with LRAD and CGA Baseline: x 2 min in stedy with CGA; x2 minutes w/RW and CGA (4/17) Goal status: IN PROGRESS    LONG TERM GOALS: Target date: 05/26/2023   Pt will tolerate  standing x 10 min with LRAD and CGA Baseline: x 2 min in stedy with CGA  Goal status: NOT MET  2.  Pt will perform stand pivot transfer with LRAD and mod A Baseline: transfers with stedy; stand pivot w/CGA and RW (5/8) Goal status: MET  3.  Pt will ambulate x 5 ft in // bars with assist x 2 for safety Baseline: able to take one step in // bars per family report; pt ambulating up to 29' w/min A and RW  Goal status: MET  NEW SHORT TERM GOALS:   Target date: 06/30/2023  Pt will perform stand pivots consistently w/RW and CGA x1 for improved independence and functional mobility  Baseline: CGA-min A x2, CGA with RW (6/12)  Goal status: MET  2.  5x STS to be assessed at STG assessment and LTG written  Baseline: 2:25 min with RW (6/12) Goal status: MET  3.  Pt will ambulate 30' w/RW and min A x1 for improved endurance and functional mobility  Baseline: 45' w/CGA-min A x2, 38 ft with RW SBA with close w/c follow (6/12) Goal status: IN PROGRESS   NEW LONG TERM GOALS:  Target date: 07/28/2023  Pt will improve 5 x STS to less than or equal to 2 minutes to demonstrate improved functional strength and transfer efficiency.  Baseline: 2:25 min with RW (6/12) Goal status: INITIAL  2.  Pt will ambulate 81' w/RW and CGA x1  for improved independence and endurance  Baseline: 57' w/CGA-min A x2, 38 ft with RW SBA with close w/c follow (6/12)  Goal status: INITIAL    ASSESSMENT:  CLINICAL IMPRESSION: Emphasis of skilled PT session on assessing STG and continuing to review safety with sit to stands, gait, and stand to sits. He has met 2/3 STG as he is at [facility] for stand pivot transfers with a RW and is able to perform these safely at home now. Additionally, he was able to perform the 5xSTS assessment and was able to perform all 5 stands with SBA to his RW from his power wheelchair. He did not meet his goal of ambulating x 40 ft as he only ambulated x 38 ft this date, but has made great progress towards this goal as compared to initial assessment. He does continue to exhibit serious lack of safety awareness and importance of telling caregivers when he needs to sit down, fully turning himself when transferring before sitting down, and not reaching back for the surface he is sitting on. Even though he has made great gains in therapy so far he does continue to be limited by this decreased safety awareness which puts him and his caregivers at risk for injury. He continues to benefit from skilled PT services to work towards increased safety and independence with functional mobility and in order to decrease caregiver burden. Continue POC.    OBJECTIVE IMPAIRMENTS: decreased activity tolerance, decreased balance, decreased cognition, decreased endurance, decreased knowledge of condition, decreased knowledge of use of DME, decreased mobility, difficulty walking, decreased ROM, decreased strength, increased edema, and impaired UE functional use.   ACTIVITY LIMITATIONS: carrying, lifting, bending, standing, transfers, and bed mobility  PARTICIPATION LIMITATIONS: meal prep, cleaning, laundry, and community activity  PERSONAL FACTORS: Fitness, Time since onset of injury/illness/exacerbation, and 1-2 comorbidities:   anxiety, OA, cancer,  subdural hematoma are also affecting patient's functional outcome.   REHAB POTENTIAL: Good  CLINICAL DECISION MAKING: Stable/uncomplicated  EVALUATION COMPLEXITY: High  PLAN:  PT FREQUENCY: 1-2x/week  PT DURATION: 6 weeks +  8 weeks (recert)  PLANNED INTERVENTIONS: 16109- PT Re-evaluation, 97110-Therapeutic exercises, 97530- Therapeutic activity, [id number]- Neuromuscular re-education, 97535- Self Care, [id number]- Manual therapy, [phone]- Gait training, [phone]- Orthotic Fit/training, [phone]- Electrical stimulation (manual), Patient/Family education, Balance training, Taping, Dry Needling, Joint mobilization, Cognitive remediation, DME instructions, Wheelchair mobility training, Cryotherapy, and Moist heat  PLAN FOR NEXT SESSION: 20th PN, discuss PT POC - add visits? Work on Oncologist, retro gait, continue gait training w/RW, Sit to stands-from lower surfaces. EMOM training, independence w/transfers, step taps, lateral stepping at ballet bar again?    Ewa Hipp, PT Lorita Rosa, PT, DPT, CSRS    06/30/2023, 2:47 PM

## 2023-07-05 ENCOUNTER — Ambulatory Visit: Admitting: Physical Therapy

## 2023-07-05 DIAGNOSIS — R2689 Other abnormalities of gait and mobility: Secondary | ICD-10-CM

## 2023-07-05 DIAGNOSIS — R293 Abnormal posture: Secondary | ICD-10-CM

## 2023-07-05 DIAGNOSIS — M6281 Muscle weakness (generalized): Secondary | ICD-10-CM

## 2023-07-05 DIAGNOSIS — R2681 Unsteadiness on feet: Secondary | ICD-10-CM

## 2023-07-05 NOTE — Therapy (Signed)
 OUTPATIENT PHYSICAL THERAPY NEURO TREATMENT - 20TH VISIT PROGRESS NOTE   Patient Name: Anthony Downs MRN: [id number] DOB:Jun 18, 1950, 73 y.o., male Today's Date: 07/05/2023   PCP: Kathyleen Parkins, MD REFERRING PROVIDER: Rawland Caddy, MD  Physical Therapy Progress Note   Dates of Reporting Period:04/12/23 - 07/05/23  See Note below for Objective Data and Assessment of Progress/Goals.    END OF SESSION:  PT End of Session - 07/05/23 1312     Visit Number 20    Number of Visits 29   with eval   Date for PT Re-Evaluation 07/28/23   recert   Authorization Type Medicare    PT Start Time 1310    PT Stop Time 1352    PT Time Calculation (min) 42 min    Equipment Utilized During Treatment --    Activity Tolerance Patient tolerated treatment well    Behavior During Therapy WFL for tasks assessed/performed                 Past Medical History:  Diagnosis Date   Acid reflux    Anxiety    Arthritis    Asthma    as a child   Basal cell carcinoma 01/26/2021   RIGHT POST SURICULAR INFERIOR   Basal cell carcinoma 01/26/2021   RIGHT POST AURICULAR SUPERIOR   Benign brain tumor (HCC)    Chest pain    a. 09/2019 MV: No ischemia.   Complication of anesthesia    slow to wake up and pain medicines make him sick   GERD (gastroesophageal reflux disease)    History of DVT (deep vein thrombosis)    History of echocardiogram    a. 09/2019 Echo: EF 60-65%, nl RV fxn; b. 09/2022 Echo: EF of 60-65% without regional wall motion abnormalities, normal RV size/function, and borderline dilatation of the aortic root at 39 mm.   Hypercholesteremia    Hypertension    PONV (postoperative nausea and vomiting)    PVC's (premature ventricular contractions)    a. 08/2022 Zio: predominantly sinus rhythm with an average heart rate of 71 (50-150), 5 brief SVT runs (longest 10.2 seconds with max rate of 150).  3.2% PVC burden was also noted.  No triggered events.   Skin cancer    skin cancer  - basal cell on head, squamous behind ear   Sleep apnea    no Cpap   Subdural hematoma, post-traumatic (HCC) 2008   fell off truck hit head on concrete   Past Surgical History:  Procedure Laterality Date   APPLICATION OF CRANIAL NAVIGATION Left 04/29/2020   Procedure: APPLICATION OF CRANIAL NAVIGATION;  Surgeon: Cannon Champion, MD;  Location: [facility] OR;  Service: Neurosurgery;  Laterality: Left;   arthroscopic knee     COLONOSCOPY N/A 11/29/2018   Procedure: COLONOSCOPY;  Surgeon: Ruby Corporal, MD;  Location: [facility];  Service: Endoscopy;  Laterality: N/A;  730-rescheduled 9/2 same time per Ann   CRANIOTOMY Left 04/29/2020   Procedure: LEFT CRANIECTOMY WITH TUMOR EXCISION;  Surgeon: Cannon Champion, MD;  Location: 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   DVT (deep venous thrombosis) (HCC) 08/12/2020   COVID-19 virus infection 08/12/2020   Acute lower UTI 07/14/2020   Delirium 07/06/2020   Sleep apnea 07/06/2020   GERD (gastroesophageal reflux disease) 07/06/2020   Tetraplegia (HCC) 06/25/2020   Sundowning 06/25/2020   Slow transit constipation    Essential hypertension    Hypokalemia    Postoperative pain    Meningioma (HCC) 05/02/2020   Brain tumor (HCC) 04/28/2020   S/P nasal septoplasty 03/19/2020   Special screening for malignant neoplasms, colon 01/09/2018    ONSET DATE: 04/03/2023  REFERRING DIAG: G82.50 (ICD-10-CM) - Tetraplegia (HCC) D32.9 (ICD-10-CM) - Meningioma (HCC)  THERAPY DIAG:  Other  abnormalities of gait and mobility  Muscle weakness (generalized)  Abnormal posture  Unsteadiness on feet  Rationale for Evaluation and Treatment: Rehabilitation  SUBJECTIVE:                                                                                                                                                                                             SUBJECTIVE STATEMENT:  Pt presents in power chair. [name] reports pt is doing well at home, is transferring to/from his lift chair on his own. Pt still a bit hesitant at times due to his R foot catching occasionally, but overall is doing more at home.   Had a good day yesterday, saw his kids and grandkids.   Pt accompanied by: self and significant other [name]  PERTINENT HISTORY: anxiety, OA, cancer, subdural hematoma   PAIN:  Are you having pain? No  PRECAUTIONS: Fall  RED FLAGS: None   WEIGHT BEARING RESTRICTIONS: No  FALLS: Has patient fallen in last 6 months? No  LIVING ENVIRONMENT: Lives with: lives with their spouse Lives in: House/apartment Home is power wheelchair accessible Has following equipment at home: Wheelchair (power), Ramped entry, and slide board, [name], handicap accessible bathroom, hospital bed  PLOF: Independent with household mobility with device, Needs assistance with ADLs, and Needs assistance with transfers  PATIENT GOALS: stand pivot transfer take steps in // bars  OBJECTIVE:  Note: Objective measures were completed at Evaluation unless otherwise noted.  DIAGNOSTIC FINDINGS: None relevant to this POC  COGNITION: Overall cognitive status: Impaired   SENSATION: No N/T per patient report  EDEMA:  Yes in BLE, wears compression stockings  POSTURE: rounded shoulders, forward head, and posterior pelvic tilt  LOWER EXTREMITY ROM:     Passive  Right Eval Left Eval  Hip flexion Tight hip flexors Tight hip flexors  Hip extension    Hip abduction  Hip adduction    Hip  internal rotation    Hip external rotation    Knee flexion    Knee extension Tight HS Tight HS  Ankle dorsiflexion Tight gastroc Tight gastroc  Ankle plantarflexion    Ankle inversion    Ankle eversion     (Blank rows = not tested)  LOWER EXTREMITY MMT:    MMT Right Eval Left Eval  Hip flexion 2- 3  Hip extension    Hip abduction    Hip adduction    Hip internal rotation    Hip external rotation    Knee flexion 2- 3  Knee extension 2- 3  Ankle dorsiflexion 0 1  Ankle plantarflexion    Ankle inversion    Ankle eversion    (Blank rows = not tested)  BED MOBILITY:  Sit to supine Mod A Supine to sit Min A Min A for trunk for supine to sit Mod A for BLE for sit to supine  TRANSFERS: Assistive device utilized: stedy  Sit to stand: SBA Stand to sit: SBA Chair to chair: dependent with use of stedy  VITALS  There were no vitals filed for this visit.                                                                                                                              TREATMENT:   Self-care  Entirety of session spent discussing POC moving forward and importance of pt motivating himself to do more at home rather than having [name] annoy him to do things. Pt acknowledges that he is not taking enough initiative at home and is in agreement to reduce PT frequency to 1x/week moving forward to allow more time to do things at home. Pt states he would like to be able to walk to his mailbox and back. Informed pt this would require a 180 degree turn, so it is imperative that he is working on proper turns at home. Pt verbalized understanding.     PATIENT EDUCATION: Education details: Continue to work on stand pivot transfers at home with RW Person educated: Patient, [name] Education method: Explanation, Demonstration, and Verbal cues Education comprehension: verbalized understanding and needs further education  HOME EXERCISE PROGRAM: To be initiated as  appropriate  GOALS: Goals reviewed with patient? Yes  SHORT TERM GOALS: Target date: 05/05/2023  Pt will tolerate standing x 5 min with LRAD and CGA Baseline: x 2 min in stedy with CGA; x2 minutes w/RW and CGA (4/17) Goal status: IN PROGRESS    LONG TERM GOALS: Target date: 05/26/2023   Pt will tolerate standing x 10 min with LRAD and CGA Baseline: x 2 min in stedy with CGA  Goal status: NOT MET  2.  Pt will perform stand pivot transfer with LRAD and mod A Baseline: transfers with stedy; stand pivot w/CGA and RW (5/8) Goal status: MET  3.  Pt will ambulate x 5 ft in // bars with assist  x 2 for safety Baseline: able to take one step in // bars per family report; pt ambulating up to 54' w/min A and RW  Goal status: MET  NEW SHORT TERM GOALS:   Target date: 06/30/2023  Pt will perform stand pivots consistently w/RW and CGA x1 for improved independence and functional mobility  Baseline: CGA-min A x2, CGA with RW (6/12)  Goal status: MET  2.  5x STS to be assessed at STG assessment and LTG written  Baseline: 2:25 min with RW (6/12) Goal status: MET  3.  Pt will ambulate 106' w/RW and min A x1 for improved endurance and functional mobility  Baseline: 68' w/CGA-min A x2, 38 ft with RW SBA with close w/c follow (6/12) Goal status: IN PROGRESS   NEW LONG TERM GOALS:  Target date: 07/28/2023  Pt will improve 5 x STS to less than or equal to 2 minutes to demonstrate improved functional strength and transfer efficiency.  Baseline: 2:25 min with RW (6/12) Goal status: INITIAL  2.  Pt will ambulate 3' w/RW and CGA x1 for improved independence and endurance  Baseline: 43' w/CGA-min A x2, 38 ft with RW SBA with close w/c follow (6/12)  Goal status: INITIAL    ASSESSMENT:  CLINICAL IMPRESSION: Emphasis of skilled PT session on pt education regarding importance of motivating himself to work on PT at home and discussing POC moving forward. [name] reports pt has been short with her at  home and pt states this is due to her nagging him to stand and work on transfers at home. Informed pt that he must be working on transfers and independence w/mobility at home in order to progress and boost endurance. Pt in agreement with this and would like to work towards walking to his mailbox at home. Pt in agreement to add more PT appointments at a frequency of 1x/week to allow more time to work on things at home. See previous treatment note for further explanation of STGs for progress note. Continue POC.    OBJECTIVE IMPAIRMENTS: decreased activity tolerance, decreased balance, decreased cognition, decreased endurance, decreased knowledge of condition, decreased knowledge of use of DME, decreased mobility, difficulty walking, decreased ROM, decreased strength, increased edema, and impaired UE functional use.   ACTIVITY LIMITATIONS: carrying, lifting, bending, standing, transfers, and bed mobility  PARTICIPATION LIMITATIONS: meal prep, cleaning, laundry, and community activity  PERSONAL FACTORS: Fitness, Time since onset of injury/illness/exacerbation, and 1-2 comorbidities:   anxiety, OA, cancer, subdural hematoma are also affecting patient's functional outcome.   REHAB POTENTIAL: Good  CLINICAL DECISION MAKING: Stable/uncomplicated  EVALUATION COMPLEXITY: High  PLAN:  PT FREQUENCY: 1-2x/week  PT DURATION: 6 weeks + 8 weeks (recert)  PLANNED INTERVENTIONS: 91478- PT Re-evaluation, 97110-Therapeutic exercises, 97530- Therapeutic activity, 97112- Neuromuscular re-education, 97535- Self Care, 29562- Manual therapy, [phone]- Gait training, [phone]- Orthotic Fit/training, [phone]- Electrical stimulation (manual), Patient/Family education, Balance training, Taping, Dry Needling, Joint mobilization, Cognitive remediation, DME instructions, Wheelchair mobility training, Cryotherapy, and Moist heat  PLAN FOR NEXT SESSION: discuss PT POC - add visits? Work on Oncologist, retro gait, continue gait  training w/RW, Sit to stands-from lower surfaces. EMOM training, independence w/transfers, step taps, lateral stepping at ballet bar again?    Stevan Eberwein E Lindaann Gradilla, PT, DPT 07/05/2023, 1:55 PM

## 2023-07-07 ENCOUNTER — Ambulatory Visit: Admitting: Physical Therapy

## 2023-07-07 DIAGNOSIS — R2689 Other abnormalities of gait and mobility: Secondary | ICD-10-CM | POA: Diagnosis not present

## 2023-07-07 DIAGNOSIS — M6281 Muscle weakness (generalized): Secondary | ICD-10-CM

## 2023-07-07 DIAGNOSIS — R2681 Unsteadiness on feet: Secondary | ICD-10-CM

## 2023-07-07 DIAGNOSIS — R293 Abnormal posture: Secondary | ICD-10-CM

## 2023-07-07 NOTE — Therapy (Signed)
 OUTPATIENT PHYSICAL THERAPY NEURO TREATMENT   Patient Name: GUY SEESE MRN: [id number] DOB:05-28-1950, 73 y.o., male Today's Date: 07/07/2023   PCP: Kathyleen Parkins, MD REFERRING PROVIDER: Rawland Caddy, MD    END OF SESSION:  PT End of Session - 07/07/23 1314     Visit Number 21    Number of Visits 29   with eval   Date for PT Re-Evaluation 07/28/23   recert   Authorization Type Medicare    PT Start Time 1310    PT Stop Time 1404    PT Time Calculation (min) 54 min    Equipment Utilized During Treatment Gait belt    Activity Tolerance Patient tolerated treatment well    Behavior During Therapy WFL for tasks assessed/performed                  Past Medical History:  Diagnosis Date   Acid reflux    Anxiety    Arthritis    Asthma    as a child   Basal cell carcinoma 01/26/2021   RIGHT POST SURICULAR INFERIOR   Basal cell carcinoma 01/26/2021   RIGHT POST AURICULAR SUPERIOR   Benign brain tumor (HCC)    Chest pain    a. 09/2019 MV: No ischemia.   Complication of anesthesia    slow to wake up and pain medicines make him sick   GERD (gastroesophageal reflux disease)    History of DVT (deep vein thrombosis)    History of echocardiogram    a. 09/2019 Echo: EF 60-65%, nl RV fxn; b. 09/2022 Echo: EF of 60-65% without regional wall motion abnormalities, normal RV size/function, and borderline dilatation of the aortic root at 39 mm.   Hypercholesteremia    Hypertension    PONV (postoperative nausea and vomiting)    PVC's (premature ventricular contractions)    a. 08/2022 Zio: predominantly sinus rhythm with an average heart rate of 71 (50-150), 5 brief SVT runs (longest 10.2 seconds with max rate of 150).  3.2% PVC burden was also noted.  No triggered events.   Skin cancer    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, squamous behind ear   Sleep apnea    no Cpap   Subdural hematoma, post-traumatic (HCC) 2008   fell off truck hit head on concrete   Past  Surgical History:  Procedure Laterality Date   APPLICATION OF CRANIAL NAVIGATION Left 04/29/2020   Procedure: APPLICATION OF CRANIAL NAVIGATION;  Surgeon: Cannon Champion, MD;  Location: [facility] OR;  Service: Neurosurgery;  Laterality: Left;   arthroscopic knee     COLONOSCOPY N/A 11/29/2018   Procedure: COLONOSCOPY;  Surgeon: Ruby Corporal, MD;  Location: [facility];  Service: Endoscopy;  Laterality: N/A;  730-rescheduled 9/2 same time per Ann   CRANIOTOMY Left 04/29/2020   Procedure: LEFT CRANIECTOMY WITH TUMOR EXCISION;  Surgeon: Cannon Champion, MD;  Location: [facility] OR;  Service: Neurosurgery;  Laterality: Left;   KNEE SURGERY Right    NASAL SEPTOPLASTY W/ TURBINOPLASTY Bilateral 03/19/2020   Procedure: NASAL SEPTOPLASTY WITH BILATERAL  TURBINATE REDUCTION;  Surgeon: Reynold Caves, MD;  Location: [facility] OR;  Service: ENT;  Laterality: Bilateral;   VASECTOMY     Patient Active Problem List   Diagnosis Date Noted   Current use of long term anticoagulation 05/06/2022   Seizure (HCC) 08/13/2020   Seizures (HCC) 08/12/2020   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   DVT (deep venous thrombosis) (HCC) 08/12/2020   COVID-19 virus infection 08/12/2020  Acute lower UTI 07/14/2020   Delirium 07/06/2020   Sleep apnea 07/06/2020   GERD (gastroesophageal reflux disease) 07/06/2020   Tetraplegia (HCC) 06/25/2020   Sundowning 06/25/2020   Slow transit constipation    Essential hypertension    Hypokalemia    Postoperative pain    Meningioma (HCC) 05/02/2020   Brain tumor (HCC) 04/28/2020   S/P nasal septoplasty 03/19/2020   Special screening for malignant neoplasms, colon 01/09/2018    ONSET DATE: 04/03/2023  REFERRING DIAG: G82.50 (ICD-10-CM) - Tetraplegia (HCC) D32.9 (ICD-10-CM) - Meningioma (HCC)  THERAPY DIAG:  Other abnormalities of gait and mobility  Muscle weakness (generalized)  Abnormal posture  Unsteadiness on feet  Rationale for Evaluation and Treatment:  Rehabilitation  SUBJECTIVE:                                                                                                                                                                                             SUBJECTIVE STATEMENT:  Pt continues to do well with transfers at home. Denies any acute changes since last visit.  Pt accompanied by: self and significant other [name]  PERTINENT HISTORY: anxiety, OA, cancer, subdural hematoma   PAIN:  Are you having pain? No  PRECAUTIONS: Fall  RED FLAGS: None   WEIGHT BEARING RESTRICTIONS: No  FALLS: Has patient fallen in last 6 months? No  LIVING ENVIRONMENT: Lives with: lives with their spouse Lives in: House/apartment Home is power wheelchair accessible Has following equipment at home: Wheelchair (power), Ramped entry, and slide board, [name], handicap accessible bathroom, hospital bed  PLOF: Independent with household mobility with device, Needs assistance with ADLs, and Needs assistance with transfers  PATIENT GOALS: stand pivot transfer take steps in // bars  OBJECTIVE:  Note: Objective measures were completed at Evaluation unless otherwise noted.  DIAGNOSTIC FINDINGS: None relevant to this POC  COGNITION: Overall cognitive status: Impaired   SENSATION: No N/T per patient report  EDEMA:  Yes in BLE, wears compression stockings  POSTURE: rounded shoulders, forward head, and posterior pelvic tilt  LOWER EXTREMITY ROM:     Passive  Right Eval Left Eval  Hip flexion Tight hip flexors Tight hip flexors  Hip extension    Hip abduction    Hip adduction    Hip internal rotation    Hip external rotation    Knee flexion    Knee extension Tight HS Tight HS  Ankle dorsiflexion Tight gastroc Tight gastroc  Ankle plantarflexion    Ankle inversion    Ankle eversion     (Blank rows = not tested)  LOWER EXTREMITY MMT:    MMT Right Eval  Left Eval  Hip flexion 2- 3  Hip extension    Hip abduction     Hip adduction    Hip internal rotation    Hip external rotation    Knee flexion 2- 3  Knee extension 2- 3  Ankle dorsiflexion 0 1  Ankle plantarflexion    Ankle inversion    Ankle eversion    (Blank rows = not tested)  BED MOBILITY:  Sit to supine Mod A Supine to sit Min A Min A for trunk for supine to sit Mod A for BLE for sit to supine  TRANSFERS: Assistive device utilized: stedy  Sit to stand: SBA Stand to sit: SBA Chair to chair: dependent with use of stedy  VITALS  There were no vitals filed for this visit.                                                                                                                              TREATMENT:   TherAct To work on functional hip strengthening at ballet bar: Sit to stand from elevated PWC with CGA, one hand on arm of chair and one hand on ballet bar Sit to stand from blue arm chair with CGA, increases to mod A with onset of fatigue, one hand on arm of chair and one hand on ballet bar Pt needs encouragement to attempt to stand from this lower height as independently as possible Lateral sidestepping L/R 3 x 5 ft each direction with seated rest break in between each repetition CGA for balance Cues for upright posture as pt tends to flex his trunk and lean on his forearms on bar Stand pivot transfer from ballet bar to bari RW Pt able to take a few steps with RW before onset of fatigue, returns to sitting in blue chair Pt does indicate his need to sit before each sit, reaches back arm of chair for 90% of stand to sits this session   PATIENT EDUCATION: Education details: Continue to work on stand pivot transfers at home with RW Person educated: Patient, Designer, multimedia Education method: Explanation, Demonstration, and Verbal cues Education comprehension: verbalized understanding and needs further education  HOME EXERCISE PROGRAM: To be initiated as appropriate  GOALS: Goals reviewed with patient? Yes  SHORT TERM GOALS:  Target date: 05/05/2023  Pt will tolerate standing x 5 min with LRAD and CGA Baseline: x 2 min in stedy with CGA; x2 minutes w/RW and CGA (4/17) Goal status: IN PROGRESS    LONG TERM GOALS: Target date: 05/26/2023   Pt will tolerate standing x 10 min with LRAD and CGA Baseline: x 2 min in stedy with CGA  Goal status: NOT MET  2.  Pt will perform stand pivot transfer with LRAD and mod A Baseline: transfers with stedy; stand pivot w/CGA and RW (5/8) Goal status: MET  3.  Pt will ambulate x 5 ft in // bars with assist x 2 for safety Baseline: able to take  one step in // bars per family report; pt ambulating up to 61' w/min A and RW  Goal status: MET  NEW SHORT TERM GOALS:   Target date: 06/30/2023  Pt will perform stand pivots consistently w/RW and CGA x1 for improved independence and functional mobility  Baseline: CGA-min A x2, CGA with RW (6/12)  Goal status: MET  2.  5x STS to be assessed at STG assessment and LTG written  Baseline: 2:25 min with RW (6/12) Goal status: MET  3.  Pt will ambulate 44' w/RW and min A x1 for improved endurance and functional mobility  Baseline: 65' w/CGA-min A x2, 38 ft with RW SBA with close w/c follow (6/12) Goal status: IN PROGRESS   NEW LONG TERM GOALS:  Target date: 07/28/2023  Pt will improve 5 x STS to less than or equal to 2 minutes to demonstrate improved functional strength and transfer efficiency.  Baseline: 2:25 min with RW (6/12) Goal status: INITIAL  2.  Pt will ambulate 56' w/RW and CGA x1 for improved independence and endurance  Baseline: 21' w/CGA-min A x2, 38 ft with RW SBA with close w/c follow (6/12)  Goal status: INITIAL    ASSESSMENT:  CLINICAL IMPRESSION: Emphasis of skilled PT session on work on hip abd strengthening at ballet bar and upright posture with standing tasks. He fatigues quickly with lateral sidestepping and needs cueing to maintain upright posture and not rest on his forearms while standing. He continues  to exhibit decreased step length and limb clearance with his RLE especially with onset of fatigue. He exhibits improved adherence to safety measures this session by calling out his need to sit and reaching back for the arm of the chair he is sitting in during the majority of his transfers. He is unable to safely work on short distance gait and turns with RW at end of session due to fatigue but can benefit from practice of this in future sessions. Continue POC.    OBJECTIVE IMPAIRMENTS: decreased activity tolerance, decreased balance, decreased cognition, decreased endurance, decreased knowledge of condition, decreased knowledge of use of DME, decreased mobility, difficulty walking, decreased ROM, decreased strength, increased edema, and impaired UE functional use.   ACTIVITY LIMITATIONS: carrying, lifting, bending, standing, transfers, and bed mobility  PARTICIPATION LIMITATIONS: meal prep, cleaning, laundry, and community activity  PERSONAL FACTORS: Fitness, Time since onset of injury/illness/exacerbation, and 1-2 comorbidities:   anxiety, OA, cancer, subdural hematoma are also affecting patient's functional outcome.   REHAB POTENTIAL: Good  CLINICAL DECISION MAKING: Stable/uncomplicated  EVALUATION COMPLEXITY: High  PLAN:  PT FREQUENCY: 1-2x/week  PT DURATION: 6 weeks + 8 weeks (recert)  PLANNED INTERVENTIONS: 16109- PT Re-evaluation, 97110-Therapeutic exercises, 97530- Therapeutic activity, 97112- Neuromuscular re-education, 97535- Self Care, 60454- Manual therapy, [phone]- Gait training, [phone]- Orthotic Fit/training, [phone]- Electrical stimulation (manual), Patient/Family education, Balance training, Taping, Dry Needling, Joint mobilization, Cognitive remediation, DME instructions, Wheelchair mobility training, Cryotherapy, and Moist heat  PLAN FOR NEXT SESSION: Work on Oncologist, retro gait, continue gait training w/RW, Sit to stands-from lower surfaces. EMOM training, independence  w/transfers, step taps, lateral stepping at ballet bar again? 180 degree turns with RW   Lorita Rosa, PT Lorita Rosa, PT, DPT, CSRS  07/07/2023, 2:04 PM

## 2023-07-14 ENCOUNTER — Ambulatory Visit: Payer: Self-pay | Admitting: Physical Therapy

## 2023-07-14 DIAGNOSIS — R2689 Other abnormalities of gait and mobility: Secondary | ICD-10-CM | POA: Diagnosis not present

## 2023-07-14 DIAGNOSIS — M6281 Muscle weakness (generalized): Secondary | ICD-10-CM

## 2023-07-14 DIAGNOSIS — R293 Abnormal posture: Secondary | ICD-10-CM

## 2023-07-14 DIAGNOSIS — R2681 Unsteadiness on feet: Secondary | ICD-10-CM

## 2023-07-14 NOTE — Therapy (Signed)
 OUTPATIENT PHYSICAL THERAPY NEURO TREATMENT   Patient Name: Anthony Downs MRN: [id number] DOB:May 31, 1950, 73 y.o., male Today's Date: 07/14/2023   PCP: Bertell Satterfield, MD REFERRING PROVIDER: Babs Arthea DASEN, MD    END OF SESSION:  PT End of Session - 07/14/23 1403     Visit Number 22    Number of Visits 29   with eval   Date for PT Re-Evaluation 07/28/23   recert   Authorization Type Medicare    PT Start Time 1402    PT Stop Time 1445    PT Time Calculation (min) 43 min    Equipment Utilized During Treatment Gait belt    Activity Tolerance Patient tolerated treatment well    Behavior During Therapy WFL for tasks assessed/performed                  Past Medical History:  Diagnosis Date   Acid reflux    Anxiety    Arthritis    Asthma    as a child   Basal cell carcinoma 01/26/2021   RIGHT POST SURICULAR INFERIOR   Basal cell carcinoma 01/26/2021   RIGHT POST AURICULAR SUPERIOR   Benign brain tumor (HCC)    Chest pain    a. 09/2019 MV: No ischemia.   Complication of anesthesia    slow to wake up and pain medicines make him sick   GERD (gastroesophageal reflux disease)    History of DVT (deep vein thrombosis)    History of echocardiogram    a. 09/2019 Echo: EF 60-65%, nl RV fxn; b. 09/2022 Echo: EF of 60-65% without regional wall motion abnormalities, normal RV size/function, and borderline dilatation of the aortic root at 39 mm.   Hypercholesteremia    Hypertension    PONV (postoperative nausea and vomiting)    PVC's (premature ventricular contractions)    a. 08/2022 Zio: predominantly sinus rhythm with an average heart rate of 71 (50-150), 5 brief SVT runs (longest 10.2 seconds with max rate of 150).  3.2% PVC burden was also noted.  No triggered events.   Skin cancer    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, squamous behind ear   Sleep apnea    no Cpap   Subdural hematoma, post-traumatic (HCC) 2008   fell off truck hit head on concrete   Past  Surgical History:  Procedure Laterality Date   APPLICATION OF CRANIAL NAVIGATION Left 04/29/2020   Procedure: APPLICATION OF CRANIAL NAVIGATION;  Surgeon: Cheryle Debby LABOR, MD;  Location: [facility] OR;  Service: Neurosurgery;  Laterality: Left;   arthroscopic knee     COLONOSCOPY N/A 11/29/2018   Procedure: COLONOSCOPY;  Surgeon: Golda Claudis PENNER, MD;  Location: [facility];  Service: Endoscopy;  Laterality: N/A;  730-rescheduled 9/2 same time per Ann   CRANIOTOMY Left 04/29/2020   Procedure: LEFT CRANIECTOMY WITH TUMOR EXCISION;  Surgeon: Cheryle Debby LABOR, MD;  Location: [facility] OR;  Service: Neurosurgery;  Laterality: Left;   KNEE SURGERY Right    NASAL SEPTOPLASTY W/ TURBINOPLASTY Bilateral 03/19/2020   Procedure: NASAL SEPTOPLASTY WITH BILATERAL  TURBINATE REDUCTION;  Surgeon: Karis Clunes, MD;  Location: [facility] OR;  Service: ENT;  Laterality: Bilateral;   VASECTOMY     Patient Active Problem List   Diagnosis Date Noted   Current use of long term anticoagulation 05/06/2022   Seizure (HCC) 08/13/2020   Seizures (HCC) 08/12/2020   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   DVT (deep venous thrombosis) (HCC) 08/12/2020   COVID-19 virus infection 08/12/2020  Acute lower UTI 07/14/2020   Delirium 07/06/2020   Sleep apnea 07/06/2020   GERD (gastroesophageal reflux disease) 07/06/2020   Tetraplegia (HCC) 06/25/2020   Sundowning 06/25/2020   Slow transit constipation    Essential hypertension    Hypokalemia    Postoperative pain    Meningioma (HCC) 05/02/2020   Brain tumor (HCC) 04/28/2020   S/P nasal septoplasty 03/19/2020   Special screening for malignant neoplasms, colon 01/09/2018    ONSET DATE: 04/03/2023  REFERRING DIAG: G82.50 (ICD-10-CM) - Tetraplegia (HCC) D32.9 (ICD-10-CM) - Meningioma (HCC)  THERAPY DIAG:  Other abnormalities of gait and mobility  Muscle weakness (generalized)  Abnormal posture  Unsteadiness on feet  Rationale for Evaluation and Treatment:  Rehabilitation  SUBJECTIVE:                                                                                                                                                                                             SUBJECTIVE STATEMENT:  Pt reports his L hip really hurt for about 5 days after last session, so did not get up much at home. No pain today, took an ibuprofen prior to session. No falls.   Pt accompanied by: self and significant other [name]  PERTINENT HISTORY: anxiety, OA, cancer, subdural hematoma   PAIN:  Are you having pain? No  PRECAUTIONS: Fall  RED FLAGS: None   WEIGHT BEARING RESTRICTIONS: No  FALLS: Has patient fallen in last 6 months? No  LIVING ENVIRONMENT: Lives with: lives with their spouse Lives in: House/apartment Home is power wheelchair accessible Has following equipment at home: Wheelchair (power), Ramped entry, and slide board, [name], handicap accessible bathroom, hospital bed  PLOF: Independent with household mobility with device, Needs assistance with ADLs, and Needs assistance with transfers  PATIENT GOALS: stand pivot transfer take steps in // bars  OBJECTIVE:  Note: Objective measures were completed at Evaluation unless otherwise noted.  DIAGNOSTIC FINDINGS: None relevant to this POC  COGNITION: Overall cognitive status: Impaired   SENSATION: No N/T per patient report  EDEMA:  Yes in BLE, wears compression stockings  POSTURE: rounded shoulders, forward head, and posterior pelvic tilt  LOWER EXTREMITY ROM:     Passive  Right Eval Left Eval  Hip flexion Tight hip flexors Tight hip flexors  Hip extension    Hip abduction    Hip adduction    Hip internal rotation    Hip external rotation    Knee flexion    Knee extension Tight HS Tight HS  Ankle dorsiflexion Tight gastroc Tight gastroc  Ankle plantarflexion    Ankle inversion    Ankle eversion     (  Blank rows = not tested)  LOWER EXTREMITY MMT:    MMT  Right Eval Left Eval  Hip flexion 2- 3  Hip extension    Hip abduction    Hip adduction    Hip internal rotation    Hip external rotation    Knee flexion 2- 3  Knee extension 2- 3  Ankle dorsiflexion 0 1  Ankle plantarflexion    Ankle inversion    Ankle eversion    (Blank rows = not tested)  BED MOBILITY:  Sit to supine Mod A Supine to sit Min A Min A for trunk for supine to sit Mod A for BLE for sit to supine  TRANSFERS: Assistive device utilized: stedy  Sit to stand: SBA Stand to sit: SBA Chair to chair: dependent with use of stedy  VITALS  There were no vitals filed for this visit.                                                                                                                              TREATMENT:   NMR  To work on endurance, 180 degree turns and imitate walking to mailbox at home:  Placed chair at end of 50' hallway and had pt walk towards chair and practice 180 degree turn at chair:  Pt performed sit > stands mod I from power chair this date Pt ambulated 25' w/bariatric RW w/SBA and close WC follow prior to needing seated rest break. Pt did report needing to sit and reached back for chair.  Pt then ambulated 25' to chair and performed ~150 degree turn to chair, but did not report needing to sit nor did he reach back for chair, requiring therapist to move chair to avoid pt sitting in floor.  Pt required 2 attempts to stand from standard chair height w/min-mod A x2 Robbin). On first attempt, pt reached for RW prior to being upright, so fell back into chair. On second attempt, pt able to push to stand prior to reaching for RW.  Pt ambulated 14' w/RW prior to asking to sit. Pt did reach back for chair when sitting.    PATIENT EDUCATION: Education details: Continue to work on stand pivot transfers at home with RW Person educated: Patient, [name] Education method: Explanation, Demonstration, and Verbal cues Education comprehension: verbalized  understanding and needs further education  HOME EXERCISE PROGRAM: To be initiated as appropriate  GOALS: Goals reviewed with patient? Yes  SHORT TERM GOALS: Target date: 05/05/2023  Pt will tolerate standing x 5 min with LRAD and CGA Baseline: x 2 min in stedy with CGA; x2 minutes w/RW and CGA (4/17) Goal status: IN PROGRESS    LONG TERM GOALS: Target date: 05/26/2023   Pt will tolerate standing x 10 min with LRAD and CGA Baseline: x 2 min in stedy with CGA  Goal status: NOT MET  2.  Pt will perform stand pivot transfer with LRAD and mod A Baseline: transfers with  stedy; stand pivot w/CGA and RW (5/8) Goal status: MET  3.  Pt will ambulate x 5 ft in // bars with assist x 2 for safety Baseline: able to take one step in // bars per family report; pt ambulating up to 70' w/min A and RW  Goal status: MET  NEW SHORT TERM GOALS:   Target date: 06/30/2023  Pt will perform stand pivots consistently w/RW and CGA x1 for improved independence and functional mobility  Baseline: CGA-min A x2, CGA with RW (6/12)  Goal status: MET  2.  5x STS to be assessed at STG assessment and LTG written  Baseline: 2:25 min with RW (6/12) Goal status: MET  3.  Pt will ambulate 61' w/RW and min A x1 for improved endurance and functional mobility  Baseline: 79' w/CGA-min A x2, 38 ft with RW SBA with close w/c follow (6/12) Goal status: IN PROGRESS   NEW LONG TERM GOALS:  Target date: 07/28/2023  Pt will improve 5 x STS to less than or equal to 2 minutes to demonstrate improved functional strength and transfer efficiency.  Baseline: 2:25 min with RW (6/12) Goal status: INITIAL  2.  Pt will ambulate 28' w/RW and CGA x1 for improved independence and endurance  Baseline: 36' w/CGA-min A x2, 38 ft with RW SBA with close w/c follow (6/12)  Goal status: INITIAL    ASSESSMENT:  CLINICAL IMPRESSION: Emphasis of skilled PT session on imitating pt walking to mailbox at home and performing 180 degree  turn. Pt reports he has not moved much since last session due to L hip pain following side stepping activity. Pt demonstrated decreased step clearance w/RLE this date as well as forward flexed posture despite cues for upright posture. Pt unable to fully turn 180 degrees due to fatigue, so will continue to practice in PT. Continue POC.    OBJECTIVE IMPAIRMENTS: decreased activity tolerance, decreased balance, decreased cognition, decreased endurance, decreased knowledge of condition, decreased knowledge of use of DME, decreased mobility, difficulty walking, decreased ROM, decreased strength, increased edema, and impaired UE functional use.   ACTIVITY LIMITATIONS: carrying, lifting, bending, standing, transfers, and bed mobility  PARTICIPATION LIMITATIONS: meal prep, cleaning, laundry, and community activity  PERSONAL FACTORS: Fitness, Time since onset of injury/illness/exacerbation, and 1-2 comorbidities:   anxiety, OA, cancer, subdural hematoma are also affecting patient's functional outcome.   REHAB POTENTIAL: Good  CLINICAL DECISION MAKING: Stable/uncomplicated  EVALUATION COMPLEXITY: High  PLAN:  PT FREQUENCY: 1-2x/week  PT DURATION: 6 weeks + 8 weeks (recert)  PLANNED INTERVENTIONS: 02835- PT Re-evaluation, 97110-Therapeutic exercises, 97530- Therapeutic activity, 97112- Neuromuscular re-education, 97535- Self Care, 02859- Manual therapy, [phone]- Gait training, [phone]- Orthotic Fit/training, [phone]- Electrical stimulation (manual), Patient/Family education, Balance training, Taping, Dry Needling, Joint mobilization, Cognitive remediation, DME instructions, Wheelchair mobility training, Cryotherapy, and Moist heat  PLAN FOR NEXT SESSION: Work on Oncologist, retro gait, continue gait training w/RW, Sit to stands-from lower surfaces. EMOM training, independence w/transfers, step taps, lateral stepping at ballet bar again? 180 degree turns with RW   Deeanna Beightol E Woodfin Kiss, PT, DPT  07/14/2023,  2:47 PM

## 2023-07-19 ENCOUNTER — Ambulatory Visit: Payer: Self-pay | Attending: Physical Medicine & Rehabilitation | Admitting: Physical Therapy

## 2023-07-19 DIAGNOSIS — R2689 Other abnormalities of gait and mobility: Secondary | ICD-10-CM | POA: Diagnosis present

## 2023-07-19 DIAGNOSIS — R293 Abnormal posture: Secondary | ICD-10-CM | POA: Insufficient documentation

## 2023-07-19 DIAGNOSIS — R2681 Unsteadiness on feet: Secondary | ICD-10-CM | POA: Insufficient documentation

## 2023-07-19 DIAGNOSIS — M6281 Muscle weakness (generalized): Secondary | ICD-10-CM | POA: Insufficient documentation

## 2023-07-19 NOTE — Therapy (Signed)
 OUTPATIENT PHYSICAL THERAPY NEURO TREATMENT   Patient Name: Anthony Downs MRN: [id number] DOB:24-Nov-1950, 73 y.o., male Today's Date: 07/19/2023   PCP: Bertell Satterfield, MD REFERRING PROVIDER: Babs Arthea DASEN, MD    END OF SESSION:  PT End of Session - 07/19/23 1516     Visit Number 23    Number of Visits 29   with eval   Date for PT Re-Evaluation 07/28/23   recert   Authorization Type Medicare    PT Start Time 1515    PT Stop Time 1608    PT Time Calculation (min) 53 min    Equipment Utilized During Treatment Gait belt    Activity Tolerance Patient tolerated treatment well    Behavior During Therapy WFL for tasks assessed/performed             Past Medical History:  Diagnosis Date   Acid reflux    Anxiety    Arthritis    Asthma    as a child   Basal cell carcinoma 01/26/2021   RIGHT POST SURICULAR INFERIOR   Basal cell carcinoma 01/26/2021   RIGHT POST AURICULAR SUPERIOR   Benign brain tumor (HCC)    Chest pain    a. 09/2019 MV: No ischemia.   Complication of anesthesia    slow to wake up and pain medicines make him sick   GERD (gastroesophageal reflux disease)    History of DVT (deep vein thrombosis)    History of echocardiogram    a. 09/2019 Echo: EF 60-65%, nl RV fxn; b. 09/2022 Echo: EF of 60-65% without regional wall motion abnormalities, normal RV size/function, and borderline dilatation of the aortic root at 39 mm.   Hypercholesteremia    Hypertension    PONV (postoperative nausea and vomiting)    PVC's (premature ventricular contractions)    a. 08/2022 Zio: predominantly sinus rhythm with an average heart rate of 71 (50-150), 5 brief SVT runs (longest 10.2 seconds with max rate of 150).  3.2% PVC burden was also noted.  No triggered events.   Skin cancer    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, squamous behind ear   Sleep apnea    no Cpap   Subdural hematoma, post-traumatic (HCC) 2008   fell off truck hit head on concrete   Past Surgical  History:  Procedure Laterality Date   APPLICATION OF CRANIAL NAVIGATION Left 04/29/2020   Procedure: APPLICATION OF CRANIAL NAVIGATION;  Surgeon: Cheryle Debby LABOR, MD;  Location: [facility] OR;  Service: Neurosurgery;  Laterality: Left;   arthroscopic knee     COLONOSCOPY N/A 11/29/2018   Procedure: COLONOSCOPY;  Surgeon: Golda Claudis PENNER, MD;  Location: [facility];  Service: Endoscopy;  Laterality: N/A;  730-rescheduled 9/2 same time per Ann   CRANIOTOMY Left 04/29/2020   Procedure: LEFT CRANIECTOMY WITH TUMOR EXCISION;  Surgeon: Cheryle Debby LABOR, MD;  Location: [facility] OR;  Service: Neurosurgery;  Laterality: Left;   KNEE SURGERY Right    NASAL SEPTOPLASTY W/ TURBINOPLASTY Bilateral 03/19/2020   Procedure: NASAL SEPTOPLASTY WITH BILATERAL  TURBINATE REDUCTION;  Surgeon: Karis Clunes, MD;  Location: [facility] OR;  Service: ENT;  Laterality: Bilateral;   VASECTOMY     Patient Active Problem List   Diagnosis Date Noted   Current use of long term anticoagulation 05/06/2022   Seizure (HCC) 08/13/2020   Seizures (HCC) 08/12/2020   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   DVT (deep venous thrombosis) (HCC) 08/12/2020   COVID-19 virus infection 08/12/2020   Acute lower UTI  07/14/2020   Delirium 07/06/2020   Sleep apnea 07/06/2020   GERD (gastroesophageal reflux disease) 07/06/2020   Tetraplegia (HCC) 06/25/2020   Sundowning 06/25/2020   Slow transit constipation    Essential hypertension    Hypokalemia    Postoperative pain    Meningioma (HCC) 05/02/2020   Brain tumor (HCC) 04/28/2020   S/P nasal septoplasty 03/19/2020   Special screening for malignant neoplasms, colon 01/09/2018    ONSET DATE: 04/03/2023  REFERRING DIAG: G82.50 (ICD-10-CM) - Tetraplegia (HCC) D32.9 (ICD-10-CM) - Meningioma (HCC)  THERAPY DIAG:  Other abnormalities of gait and mobility  Muscle weakness (generalized)  Abnormal posture  Unsteadiness on feet  Rationale for Evaluation and Treatment:  Rehabilitation  SUBJECTIVE:                                                                                                                                                                                             SUBJECTIVE STATEMENT:  Pt reports doing well. Has been performing transfers independently at home with his walker. No falls.    Pt accompanied by: self and significant other [name]  PERTINENT HISTORY: anxiety, OA, cancer, subdural hematoma   PAIN:  Are you having pain? No  PRECAUTIONS: Fall  RED FLAGS: None   WEIGHT BEARING RESTRICTIONS: No  FALLS: Has patient fallen in last 6 months? No  LIVING ENVIRONMENT: Lives with: lives with their spouse Lives in: House/apartment Home is power wheelchair accessible Has following equipment at home: Wheelchair (power), Ramped entry, and slide board, [name], handicap accessible bathroom, hospital bed  PLOF: Independent with household mobility with device, Needs assistance with ADLs, and Needs assistance with transfers  PATIENT GOALS: stand pivot transfer take steps in // bars  OBJECTIVE:  Note: Objective measures were completed at Evaluation unless otherwise noted.  DIAGNOSTIC FINDINGS: None relevant to this POC  COGNITION: Overall cognitive status: Impaired   SENSATION: No N/T per patient report  EDEMA:  Yes in BLE, wears compression stockings  POSTURE: rounded shoulders, forward head, and posterior pelvic tilt  LOWER EXTREMITY ROM:     Passive  Right Eval Left Eval  Hip flexion Tight hip flexors Tight hip flexors  Hip extension    Hip abduction    Hip adduction    Hip internal rotation    Hip external rotation    Knee flexion    Knee extension Tight HS Tight HS  Ankle dorsiflexion Tight gastroc Tight gastroc  Ankle plantarflexion    Ankle inversion    Ankle eversion     (Blank rows = not tested)  LOWER EXTREMITY MMT:    MMT Right Eval Left  Eval  Hip flexion 2- 3  Hip extension    Hip  abduction    Hip adduction    Hip internal rotation    Hip external rotation    Knee flexion 2- 3  Knee extension 2- 3  Ankle dorsiflexion 0 1  Ankle plantarflexion    Ankle inversion    Ankle eversion    (Blank rows = not tested)  BED MOBILITY:  Sit to supine Mod A Supine to sit Min A Min A for trunk for supine to sit Mod A for BLE for sit to supine  TRANSFERS: Assistive device utilized: RW  Sit to stand: SBA and CGA Stand to sit: CGA Chair to chair: CGA  VITALS  There were no vitals filed for this visit.                                                                                                                              TREATMENT:   NMR  To work on endurance, 180 degree turns and imitate walking to mailbox at home:  Placed two bariatric chairs in hallway 20' apart with a 2 airex placed in each chair. Had pt perform sit to stand from chair and walk 20' towards opposite chair and practice 180 degree turn at chair:  Pt performed sit > stands mod I from power chair this date and CGA-min Ax2 from standard chair w/airex.  Pt ambulated full 20' w/180 degree turn x2 w/CGA x2 and NO WC FOLLOW for first time. Did provide min-mod cues to encourage pt to fully turn, as pt easily fatigued with turns and attempts to sit prematurely. Pt w/improved AD management, full reach back for chair prior to sitting and verbalized need to sit.   On 3rd attempt to ambulate 20' to chair, pt able to make it to chair but could not complete full turn despite max cues to try. Moved chair to prevent pt sitting on floor. Pt tends to be self-limiting with challenging tasks and informed pt of this. Pt perseverating on being done with session, but was able to perform final sit to stand w/min-mod A x2 and ambulate 15' w/RW to power chair and perform full 180 degree turn to sit w/CGA.    PATIENT EDUCATION: Education details: Continue to work on stand pivot transfers at home with RW, importance of having a  positive mindset with therapy and learning to push through when able  Person educated: Patient, Designer, multimedia Education method: Explanation, Demonstration, and Verbal cues Education comprehension: verbalized understanding and needs further education  HOME EXERCISE PROGRAM: To be initiated as appropriate  GOALS: Goals reviewed with patient? Yes  SHORT TERM GOALS: Target date: 05/05/2023  Pt will tolerate standing x 5 min with LRAD and CGA Baseline: x 2 min in stedy with CGA; x2 minutes w/RW and CGA (4/17) Goal status: IN PROGRESS    LONG TERM GOALS: Target date: 05/26/2023   Pt will tolerate standing x 10 min with  LRAD and CGA Baseline: x 2 min in stedy with CGA  Goal status: NOT MET  2.  Pt will perform stand pivot transfer with LRAD and mod A Baseline: transfers with stedy; stand pivot w/CGA and RW (5/8) Goal status: MET  3.  Pt will ambulate x 5 ft in // bars with assist x 2 for safety Baseline: able to take one step in // bars per family report; pt ambulating up to 39' w/min A and RW  Goal status: MET  NEW SHORT TERM GOALS:   Target date: 06/30/2023  Pt will perform stand pivots consistently w/RW and CGA x1 for improved independence and functional mobility  Baseline: CGA-min A x2, CGA with RW (6/12)  Goal status: MET  2.  5x STS to be assessed at STG assessment and LTG written  Baseline: 2:25 min with RW (6/12) Goal status: MET  3.  Pt will ambulate 32' w/RW and min A x1 for improved endurance and functional mobility  Baseline: 37' w/CGA-min A x2, 38 ft with RW SBA with close w/c follow (6/12) Goal status: IN PROGRESS   NEW LONG TERM GOALS:  Target date: 07/28/2023  Pt will improve 5 x STS to less than or equal to 2 minutes to demonstrate improved functional strength and transfer efficiency.  Baseline: 2:25 min with RW (6/12) Goal status: INITIAL  2.  Pt will ambulate 46' w/RW and CGA x1 for improved independence and endurance  Baseline: 65' w/CGA-min A x2, 38 ft with RW  SBA with close w/c follow (6/12)  Goal status: INITIAL    ASSESSMENT:  CLINICAL IMPRESSION: Emphasis of skilled PT session on continued practice w/ pt walking to mailbox at home and performing 180 degree turn. Pt able to walk w/RW without WC follow for first time today but continues to require cues to fully turn to sit to chair as pt gets fatigued w/turns and attempts to stop halfway despite being able to perform full transfer safely. Informed pt he is capable of pushing through when he is fatigued and needs to find a way to do so at home to improve endurance and confidence w/mobility. Continue POC.    OBJECTIVE IMPAIRMENTS: decreased activity tolerance, decreased balance, decreased cognition, decreased endurance, decreased knowledge of condition, decreased knowledge of use of DME, decreased mobility, difficulty walking, decreased ROM, decreased strength, increased edema, and impaired UE functional use.   ACTIVITY LIMITATIONS: carrying, lifting, bending, standing, transfers, and bed mobility  PARTICIPATION LIMITATIONS: meal prep, cleaning, laundry, and community activity  PERSONAL FACTORS: Fitness, Time since onset of injury/illness/exacerbation, and 1-2 comorbidities:   anxiety, OA, cancer, subdural hematoma are also affecting patient's functional outcome.   REHAB POTENTIAL: Good  CLINICAL DECISION MAKING: Stable/uncomplicated  EVALUATION COMPLEXITY: High  PLAN:  PT FREQUENCY: 1-2x/week  PT DURATION: 6 weeks + 8 weeks (recert)  PLANNED INTERVENTIONS: 02835- PT Re-evaluation, 97110-Therapeutic exercises, 97530- Therapeutic activity, 97112- Neuromuscular re-education, 97535- Self Care, 02859- Manual therapy, [phone]- Gait training, [phone]- Orthotic Fit/training, [phone]- Electrical stimulation (manual), Patient/Family education, Balance training, Taping, Dry Needling, Joint mobilization, Cognitive remediation, DME instructions, Wheelchair mobility training, Cryotherapy, and Moist  heat  PLAN FOR NEXT SESSION: RECERT. Work on Oncologist, retro gait, continue gait training w/RW, Sit to stands-from lower surfaces. EMOM training, independence w/transfers, step taps, lateral stepping at ballet bar again? 180 degree turns with RW   Guelda Batson E Hasini Peachey, PT, DPT  07/19/2023, 4:17 PM

## 2023-07-26 ENCOUNTER — Ambulatory Visit: Admitting: Physical Therapy

## 2023-07-26 DIAGNOSIS — R2689 Other abnormalities of gait and mobility: Secondary | ICD-10-CM

## 2023-07-26 DIAGNOSIS — R293 Abnormal posture: Secondary | ICD-10-CM

## 2023-07-26 DIAGNOSIS — M6281 Muscle weakness (generalized): Secondary | ICD-10-CM

## 2023-07-26 DIAGNOSIS — R2681 Unsteadiness on feet: Secondary | ICD-10-CM

## 2023-07-26 NOTE — Therapy (Signed)
 OUTPATIENT PHYSICAL THERAPY NEURO TREATMENT - RECERTIFICATION   Patient Name: Anthony Downs MRN: [id number] DOB:Nov 28, 1950, 73 y.o., male Today's Date: 07/26/2023   PCP: Bertell Satterfield, MD REFERRING PROVIDER: Babs Arthea DASEN, MD    END OF SESSION:  PT End of Session - 07/26/23 1450     Visit Number 24    Number of Visits 32   with eval   Date for PT Re-Evaluation 09/20/23   recert   Authorization Type Medicare    PT Start Time 1448    PT Stop Time 1532    PT Time Calculation (min) 44 min    Equipment Utilized During Treatment Gait belt    Activity Tolerance Patient tolerated treatment well    Behavior During Therapy WFL for tasks assessed/performed              Past Medical History:  Diagnosis Date   Acid reflux    Anxiety    Arthritis    Asthma    as a child   Basal cell carcinoma 01/26/2021   RIGHT POST SURICULAR INFERIOR   Basal cell carcinoma 01/26/2021   RIGHT POST AURICULAR SUPERIOR   Benign brain tumor (HCC)    Chest pain    a. 09/2019 MV: No ischemia.   Complication of anesthesia    slow to wake up and pain medicines make him sick   GERD (gastroesophageal reflux disease)    History of DVT (deep vein thrombosis)    History of echocardiogram    a. 09/2019 Echo: EF 60-65%, nl RV fxn; b. 09/2022 Echo: EF of 60-65% without regional wall motion abnormalities, normal RV size/function, and borderline dilatation of the aortic root at 39 mm.   Hypercholesteremia    Hypertension    PONV (postoperative nausea and vomiting)    PVC's (premature ventricular contractions)    a. 08/2022 Zio: predominantly sinus rhythm with an average heart rate of 71 (50-150), 5 brief SVT runs (longest 10.2 seconds with max rate of 150).  3.2% PVC burden was also noted.  No triggered events.   Skin cancer    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, squamous behind ear   Sleep apnea    no Cpap   Subdural hematoma, post-traumatic (HCC) 2008   fell off truck hit head on concrete    Past Surgical History:  Procedure Laterality Date   APPLICATION OF CRANIAL NAVIGATION Left 04/29/2020   Procedure: APPLICATION OF CRANIAL NAVIGATION;  Surgeon: Cheryle Debby LABOR, MD;  Location: [facility] OR;  Service: Neurosurgery;  Laterality: Left;   arthroscopic knee     COLONOSCOPY N/A 11/29/2018   Procedure: COLONOSCOPY;  Surgeon: Golda Claudis PENNER, MD;  Location: [facility];  Service: Endoscopy;  Laterality: N/A;  730-rescheduled 9/2 same time per Ann   CRANIOTOMY Left 04/29/2020   Procedure: LEFT CRANIECTOMY WITH TUMOR EXCISION;  Surgeon: Cheryle Debby LABOR, MD;  Location: [facility] OR;  Service: Neurosurgery;  Laterality: Left;   KNEE SURGERY Right    NASAL SEPTOPLASTY W/ TURBINOPLASTY Bilateral 03/19/2020   Procedure: NASAL SEPTOPLASTY WITH BILATERAL  TURBINATE REDUCTION;  Surgeon: Karis Clunes, MD;  Location: [facility] OR;  Service: ENT;  Laterality: Bilateral;   VASECTOMY     Patient Active Problem List   Diagnosis Date Noted   Current use of long term anticoagulation 05/06/2022   Seizure (HCC) 08/13/2020   Seizures (HCC) 08/12/2020   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   DVT (deep venous thrombosis) (HCC) 08/12/2020   COVID-19 virus infection 08/12/2020  Acute lower UTI 07/14/2020   Delirium 07/06/2020   Sleep apnea 07/06/2020   GERD (gastroesophageal reflux disease) 07/06/2020   Tetraplegia (HCC) 06/25/2020   Sundowning 06/25/2020   Slow transit constipation    Essential hypertension    Hypokalemia    Postoperative pain    Meningioma (HCC) 05/02/2020   Brain tumor (HCC) 04/28/2020   S/P nasal septoplasty 03/19/2020   Special screening for malignant neoplasms, colon 01/09/2018    ONSET DATE: 04/03/2023  REFERRING DIAG: G82.50 (ICD-10-CM) - Tetraplegia (HCC) D32.9 (ICD-10-CM) - Meningioma (HCC)  THERAPY DIAG:  Other abnormalities of gait and mobility  Muscle weakness (generalized)  Abnormal posture  Unsteadiness on feet  Rationale for Evaluation and Treatment:  Rehabilitation  SUBJECTIVE:                                                                                                                                                                                             SUBJECTIVE STATEMENT:  Pt reports doing well. Is still transferring at home and it is going well. Walked a bit for his family on July 4th and did lose his balance posteriorly but his family was able to correct his balance. Pt aware he was off.    Pt accompanied by: self and significant other [name]  PERTINENT HISTORY: anxiety, OA, cancer, subdural hematoma   PAIN:  Are you having pain? No  PRECAUTIONS: Fall  RED FLAGS: None   WEIGHT BEARING RESTRICTIONS: No  FALLS: Has patient fallen in last 6 months? No  LIVING ENVIRONMENT: Lives with: lives with their spouse Lives in: House/apartment Home is power wheelchair accessible Has following equipment at home: Wheelchair (power), Ramped entry, and slide board, [name], handicap accessible bathroom, hospital bed  PLOF: Independent with household mobility with device, Needs assistance with ADLs, and Needs assistance with transfers  PATIENT GOALS: stand pivot transfer take steps in // bars  OBJECTIVE:  Note: Objective measures were completed at Evaluation unless otherwise noted.  DIAGNOSTIC FINDINGS: None relevant to this POC  COGNITION: Overall cognitive status: Impaired   SENSATION: No N/T per patient report  EDEMA:  Yes in BLE, wears compression stockings  POSTURE: rounded shoulders, forward head, and posterior pelvic tilt  LOWER EXTREMITY ROM:     Passive  Right Eval Left Eval  Hip flexion Tight hip flexors Tight hip flexors  Hip extension    Hip abduction    Hip adduction    Hip internal rotation    Hip external rotation    Knee flexion    Knee extension Tight HS Tight HS  Ankle dorsiflexion Tight gastroc Tight gastroc  Ankle plantarflexion  Ankle inversion    Ankle eversion      (Blank rows = not tested)  LOWER EXTREMITY MMT:    MMT Right Eval Left Eval  Hip flexion 2- 3  Hip extension    Hip abduction    Hip adduction    Hip internal rotation    Hip external rotation    Knee flexion 2- 3  Knee extension 2- 3  Ankle dorsiflexion 0 1  Ankle plantarflexion    Ankle inversion    Ankle eversion    (Blank rows = not tested)  BED MOBILITY:  Sit to supine Mod A Supine to sit Min A Min A for trunk for supine to sit Mod A for BLE for sit to supine  TRANSFERS: Assistive device utilized: RW  Sit to stand: SBA and CGA Stand to sit: CGA Chair to chair: CGA  VITALS  There were no vitals filed for this visit.                                                                                                                              TREATMENT:   Ther Act - LTG Assessment   [facility] PT Assessment - 07/26/23 1458       Transfers   Five time sit to stand comments  54.78s   SBA, BUE support         Gait pattern: {gait characteristics:25376} Distance walked: 58', 31' Assistive device utilized: {Assistive devices:23999} Level of assistance: {Levels of assistance:24026} Comments: *** Goals- shower   PATIENT EDUCATION: Education details: Continue to work on stand pivot transfers at home with RW, importance of having a positive mindset with therapy and learning to push through when able  Person educated: Patient, Designer, multimedia Education method: Explanation, Demonstration, and Verbal cues Education comprehension: verbalized understanding and needs further education  HOME EXERCISE PROGRAM: To be initiated as appropriate  GOALS: Goals reviewed with patient? Yes  SHORT TERM GOALS:   Target date: 06/30/2023  Pt will perform stand pivots consistently w/RW and CGA x1 for improved independence and functional mobility  Baseline: CGA-min A x2, CGA with RW (6/12)  Goal status: MET  2.  5x STS to be assessed at STG assessment and LTG written  Baseline: 2:25 min  with RW (6/12) Goal status: MET  3.  Pt will ambulate 88' w/RW and min A x1 for improved endurance and functional mobility  Baseline: 74' w/CGA-min A x2, 38 ft with RW SBA with close w/c follow (6/12) Goal status: IN PROGRESS   LONG TERM GOALS:  Target date: 07/28/2023  Pt will improve 5 x STS to less than or equal to 2 minutes to demonstrate improved functional strength and transfer efficiency.  Baseline: 2:25 min with RW (6/12); 54.78s (7/8) Goal status: MET  2.  Pt will ambulate 83' w/RW and CGA x1 for improved independence and endurance  Baseline: 78' w/CGA-min A x2, 38 ft with RW SBA with close w/c follow (6/12); 58  w/RW and WC follow (7/8) Goal status: IN PROGRESS  NEW SHORT TERM GOALS:   Target date: 08/23/2023  Pt will improve 5 x STS to less than or equal to 45s to demonstrate improved functional strength and transfer efficiency.  Baseline: 54.78s (7/8)  Goal status: INITIAL  2.   Pt will ambulate 43' w/RW and CGA x1 for improved independence and endurance  Baseline: 58 w/RW, CGA x1 and WC follow (7/8) Goal status: INITIAL  3.  *** Baseline: *** Goal status: {GOALSTATUS:25110}  4.  *** Baseline: *** Goal status: {GOALSTATUS:25110}  5.  *** Baseline: *** Goal status: {GOALSTATUS:25110}  6.  *** Baseline: *** Goal status: {GOALSTATUS:25110}  NEW LONG TERM GOALS:  Target date: 09/20/2023  Pt will ambulate 50' and full 180 degree turn wRW and CGA x1 for improved endurance and independence in home environment  Baseline:  Goal status: INITIAL  2.  *** Baseline: *** Goal status: {GOALSTATUS:25110}  3.  *** Baseline: *** Goal status: {GOALSTATUS:25110}  4.  *** Baseline: *** Goal status: {GOALSTATUS:25110}  5.  *** Baseline: *** Goal status: {GOALSTATUS:25110}  6.  *** Baseline: *** Goal status: {GOALSTATUS:25110}     ASSESSMENT:  CLINICAL IMPRESSION: Emphasis of skilled PT session on continued practice w/ pt walking to mailbox at home and  performing 180 degree turn. Pt able to walk w/RW without WC follow for first time today but continues to require cues to fully turn to sit to chair as pt gets fatigued w/turns and attempts to stop halfway despite being able to perform full transfer safely. Informed pt he is capable of pushing through when he is fatigued and needs to find a way to do so at home to improve endurance and confidence w/mobility. Continue POC.    OBJECTIVE IMPAIRMENTS: decreased activity tolerance, decreased balance, decreased cognition, decreased endurance, decreased knowledge of condition, decreased knowledge of use of DME, decreased mobility, difficulty walking, decreased ROM, decreased strength, increased edema, and impaired UE functional use.   ACTIVITY LIMITATIONS: carrying, lifting, bending, standing, transfers, and bed mobility  PARTICIPATION LIMITATIONS: meal prep, cleaning, laundry, and community activity  PERSONAL FACTORS: Fitness, Time since onset of injury/illness/exacerbation, and 1-2 comorbidities:   anxiety, OA, cancer, subdural hematoma are also affecting patient's functional outcome.   REHAB POTENTIAL: Good  CLINICAL DECISION MAKING: Stable/uncomplicated  EVALUATION COMPLEXITY: High  PLAN:  PT FREQUENCY: 1-2x/week  PT DURATION: 6 weeks + 8 weeks (recert)  PLANNED INTERVENTIONS: 02835- PT Re-evaluation, 97110-Therapeutic exercises, 97530- Therapeutic activity, 97112- Neuromuscular re-education, 97535- Self Care, 02859- Manual therapy, [phone]- Gait training, [phone]- Orthotic Fit/training, [phone]- Electrical stimulation (manual), Patient/Family education, Balance training, Taping, Dry Needling, Joint mobilization, Cognitive remediation, DME instructions, Wheelchair mobility training, Cryotherapy, and Moist heat  PLAN FOR NEXT SESSION: RECERT. Work on Oncologist, retro gait, continue gait training w/RW, Sit to stands-from lower surfaces. EMOM training, independence w/transfers, step taps, lateral  stepping at ballet bar again? 180 degree turns with RW   Kamaury Cutbirth E Kirby Cortese, PT, DPT  07/26/2023, 5:43 PM

## 2023-07-28 ENCOUNTER — Other Ambulatory Visit: Payer: Self-pay | Admitting: Physical Medicine & Rehabilitation

## 2023-08-04 ENCOUNTER — Ambulatory Visit: Admitting: Physical Therapy

## 2023-08-04 DIAGNOSIS — R2689 Other abnormalities of gait and mobility: Secondary | ICD-10-CM

## 2023-08-04 DIAGNOSIS — M6281 Muscle weakness (generalized): Secondary | ICD-10-CM

## 2023-08-04 DIAGNOSIS — R293 Abnormal posture: Secondary | ICD-10-CM

## 2023-08-04 DIAGNOSIS — R2681 Unsteadiness on feet: Secondary | ICD-10-CM

## 2023-08-04 NOTE — Therapy (Signed)
 OUTPATIENT PHYSICAL THERAPY NEURO TREATMENT  Patient Name: KYRESE GARTMAN MRN: [id number] DOB:17-Oct-1950, 73 y.o., male Today's Date: 08/04/2023   PCP: Bertell Satterfield, MD REFERRING PROVIDER: Babs Arthea DASEN, MD    END OF SESSION:  PT End of Session - 08/04/23 1449     Visit Number 25    Number of Visits 32   with eval   Date for PT Re-Evaluation 09/20/23   recert   Authorization Type Medicare    PT Start Time 1447    PT Stop Time 1531    PT Time Calculation (min) 44 min    Equipment Utilized During Treatment Gait belt    Activity Tolerance Patient tolerated treatment well    Behavior During Therapy WFL for tasks assessed/performed               Past Medical History:  Diagnosis Date   Acid reflux    Anxiety    Arthritis    Asthma    as a child   Basal cell carcinoma 01/26/2021   RIGHT POST SURICULAR INFERIOR   Basal cell carcinoma 01/26/2021   RIGHT POST AURICULAR SUPERIOR   Benign brain tumor (HCC)    Chest pain    a. 09/2019 MV: No ischemia.   Complication of anesthesia    slow to wake up and pain medicines make him sick   GERD (gastroesophageal reflux disease)    History of DVT (deep vein thrombosis)    History of echocardiogram    a. 09/2019 Echo: EF 60-65%, nl RV fxn; b. 09/2022 Echo: EF of 60-65% without regional wall motion abnormalities, normal RV size/function, and borderline dilatation of the aortic root at 39 mm.   Hypercholesteremia    Hypertension    PONV (postoperative nausea and vomiting)    PVC's (premature ventricular contractions)    a. 08/2022 Zio: predominantly sinus rhythm with an average heart rate of 71 (50-150), 5 brief SVT runs (longest 10.2 seconds with max rate of 150).  3.2% PVC burden was also noted.  No triggered events.   Skin cancer    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, squamous behind ear   Sleep apnea    no Cpap   Subdural hematoma, post-traumatic (HCC) 2008   fell off truck hit head on concrete   Past Surgical  History:  Procedure Laterality Date   APPLICATION OF CRANIAL NAVIGATION Left 04/29/2020   Procedure: APPLICATION OF CRANIAL NAVIGATION;  Surgeon: Cheryle Debby LABOR, MD;  Location: [facility] OR;  Service: Neurosurgery;  Laterality: Left;   arthroscopic knee     COLONOSCOPY N/A 11/29/2018   Procedure: COLONOSCOPY;  Surgeon: Golda Claudis PENNER, MD;  Location: [facility];  Service: Endoscopy;  Laterality: N/A;  730-rescheduled 9/2 same time per Ann   CRANIOTOMY Left 04/29/2020   Procedure: LEFT CRANIECTOMY WITH TUMOR EXCISION;  Surgeon: Cheryle Debby LABOR, MD;  Location: [facility] OR;  Service: Neurosurgery;  Laterality: Left;   KNEE SURGERY Right    NASAL SEPTOPLASTY W/ TURBINOPLASTY Bilateral 03/19/2020   Procedure: NASAL SEPTOPLASTY WITH BILATERAL  TURBINATE REDUCTION;  Surgeon: Karis Clunes, MD;  Location: [facility] OR;  Service: ENT;  Laterality: Bilateral;   VASECTOMY     Patient Active Problem List   Diagnosis Date Noted   Current use of long term anticoagulation 05/06/2022   Seizure (HCC) 08/13/2020   Seizures (HCC) 08/12/2020   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   DVT (deep venous thrombosis) (HCC) 08/12/2020   COVID-19 virus infection 08/12/2020   Acute lower  UTI 07/14/2020   Delirium 07/06/2020   Sleep apnea 07/06/2020   GERD (gastroesophageal reflux disease) 07/06/2020   Tetraplegia (HCC) 06/25/2020   Sundowning 06/25/2020   Slow transit constipation    Essential hypertension    Hypokalemia    Postoperative pain    Meningioma (HCC) 05/02/2020   Brain tumor (HCC) 04/28/2020   S/P nasal septoplasty 03/19/2020   Special screening for malignant neoplasms, colon 01/09/2018    ONSET DATE: 04/03/2023  REFERRING DIAG: G82.50 (ICD-10-CM) - Tetraplegia (HCC) D32.9 (ICD-10-CM) - Meningioma (HCC)  THERAPY DIAG:  Other abnormalities of gait and mobility  Muscle weakness (generalized)  Abnormal posture  Unsteadiness on feet  Rationale for Evaluation and Treatment:  Rehabilitation  SUBJECTIVE:                                                                                                                                                                                             SUBJECTIVE STATEMENT:  Pt reports presents in power chair. States he was unable to work on walking to the shower because [name] had a backwards fall and tore ligaments from her chest wall and has not been able to assist. Pt has had no falls and still working on performing sit to stand and stand pivot transfers at home independently.   Pt accompanied by: self and significant other [name]  PERTINENT HISTORY: anxiety, OA, cancer, subdural hematoma   PAIN:  Are you having pain? No  PRECAUTIONS: Fall  RED FLAGS: None   WEIGHT BEARING RESTRICTIONS: No  FALLS: Has patient fallen in last 6 months? No  LIVING ENVIRONMENT: Lives with: lives with their spouse Lives in: House/apartment Home is power wheelchair accessible Has following equipment at home: Wheelchair (power), Ramped entry, and slide board, [name], handicap accessible bathroom, hospital bed  PLOF: Independent with household mobility with device, Needs assistance with ADLs, and Needs assistance with transfers  PATIENT GOALS: stand pivot transfer take steps in // bars  OBJECTIVE:  Note: Objective measures were completed at Evaluation unless otherwise noted.  DIAGNOSTIC FINDINGS: None relevant to this POC  COGNITION: Overall cognitive status: Impaired   SENSATION: No N/T per patient report  EDEMA:  Yes in BLE, wears compression stockings  POSTURE: rounded shoulders, forward head, and posterior pelvic tilt  LOWER EXTREMITY ROM:     Passive  Right Eval Left Eval  Hip flexion Tight hip flexors Tight hip flexors  Hip extension    Hip abduction    Hip adduction    Hip internal rotation    Hip external rotation    Knee flexion    Knee extension Tight HS Tight  HS  Ankle dorsiflexion Tight  gastroc Tight gastroc  Ankle plantarflexion    Ankle inversion    Ankle eversion     (Blank rows = not tested)  LOWER EXTREMITY MMT:    MMT Right Eval Left Eval  Hip flexion 2- 3  Hip extension    Hip abduction    Hip adduction    Hip internal rotation    Hip external rotation    Knee flexion 2- 3  Knee extension 2- 3  Ankle dorsiflexion 0 1  Ankle plantarflexion    Ankle inversion    Ankle eversion    (Blank rows = not tested)  BED MOBILITY:  Sit to supine Mod A Supine to sit Min A Min A for trunk for supine to sit Mod A for BLE for sit to supine  TRANSFERS: Assistive device utilized: RW  Sit to stand: SBA and CGA Stand to sit: CGA Chair to chair: CGA  VITALS  There were no vitals filed for this visit.                                                                                                                              TREATMENT:   Ther Act  To work on endurance, 180 degree turns and imitate walking to mailbox at home:  Placed two bariatric chairs in hallway 30' apart with a 2 airex placed in each chair (height of 21.5). Had pt perform sit to stand from chair and walk 30' towards opposite chair and practice 180 degree turn at chair, back and forth:  Pt performed sit > stands mod I from power chair this date and required CGA-min Ax2 from standard chair w/airex x2 reps and required mod A x2 on rep 3.  Pt ambulated full 30' w/180 degree turn x3 w/CGA x2 and no WC follow. Pt able to perform full turn on 2 reps without cues this date. On third rep, pt able to turn 90 degrees but could not perform full 180 due to fatigue, requiring chair to be moved to prevent pt from sitting on floor. Pt demonstrating turns to L and R side this date, w/equal stability to both sides. Provided 5 minute rest break between intervals.  Pt independent with all aspects of sit to stand transfer, including positioning of RLE. Pt w/improved step clearance of RLE this date as well.  Pt  performed final sit to stand from 21.5 chair w/min A x2 and performed stand pivot to power WC w/CGA.      PATIENT EDUCATION: Education details: Continue working on stand pivot transfers at home  Person educated: Patient, Designer, multimedia Education method: Explanation, Demonstration, and Verbal cues Education comprehension: verbalized understanding and needs further education  HOME EXERCISE PROGRAM: To be initiated as appropriate  GOALS: Goals reviewed with patient? Yes  SHORT TERM GOALS:   Target date: 06/30/2023  Pt will perform stand pivots consistently w/RW and CGA x1 for improved independence and functional mobility  Baseline: CGA-min A x2, CGA with RW (6/12)  Goal status: MET  2.  5x STS to be assessed at STG assessment and LTG written  Baseline: 2:25 min with RW (6/12) Goal status: MET  3.  Pt will ambulate 19' w/RW and min A x1 for improved endurance and functional mobility  Baseline: 46' w/CGA-min A x2, 38 ft with RW SBA with close w/c follow (6/12) Goal status: IN PROGRESS   LONG TERM GOALS:  Target date: 07/28/2023  Pt will improve 5 x STS to less than or equal to 2 minutes to demonstrate improved functional strength and transfer efficiency.  Baseline: 2:25 min with RW (6/12); 54.78s (7/8) Goal status: MET  2.  Pt will ambulate 35' w/RW and CGA x1 for improved independence and endurance  Baseline: 99' w/CGA-min A x2, 38 ft with RW SBA with close w/c follow (6/12); 58 w/RW and WC follow (7/8) Goal status: IN PROGRESS  NEW SHORT TERM GOALS:   Target date: 08/23/2023  Pt will improve 5 x STS to less than or equal to 45s to demonstrate improved functional strength and transfer efficiency.  Baseline: 54.78s (7/8)  Goal status: INITIAL  2.   Pt will ambulate 43' w/RW and CGA x1 for improved independence and endurance  Baseline: 58 w/RW, CGA x1 and WC follow (7/8) Goal status: INITIAL   NEW LONG TERM GOALS:  Target date: 09/20/2023  Pt will ambulate 50' and full 180 degree  turn wRW and CGA x1 for improved endurance and independence in home environment  Baseline:  Goal status: INITIAL  2.  Pt will take shower using shower bench and RW for improved endurance, independence w/ADLs and transfers.  Baseline: currently taking bird baths  Goal status: INITIAL   ASSESSMENT:  CLINICAL IMPRESSION: Emphasis of skilled PT session on endurance, consistency with turns and sit to stands from lower surfaces. Pt tolerated session well, ambulating 30' w/full 180 degree turn x3 reps w/CGA. Pt did fatigue on final rep, with inability to perform full 180 degree turn, but has improved with his communication regarding need to sit and upright posture w/gait. Pt has been unable to work on walking to shower at home due to recent injury sustained by wife, [name], but will continue to work on stand pivot transfers at home. Continue POC.    OBJECTIVE IMPAIRMENTS: decreased activity tolerance, decreased balance, decreased cognition, decreased endurance, decreased knowledge of condition, decreased knowledge of use of DME, decreased mobility, difficulty walking, decreased ROM, decreased strength, increased edema, and impaired UE functional use.   ACTIVITY LIMITATIONS: carrying, lifting, bending, standing, transfers, and bed mobility  PARTICIPATION LIMITATIONS: meal prep, cleaning, laundry, and community activity  PERSONAL FACTORS: Fitness, Time since onset of injury/illness/exacerbation, and 1-2 comorbidities:   anxiety, OA, cancer, subdural hematoma are also affecting patient's functional outcome.   REHAB POTENTIAL: Good  CLINICAL DECISION MAKING: Stable/uncomplicated  EVALUATION COMPLEXITY: High  PLAN:  PT FREQUENCY: 1-2x/week  PT DURATION: 6 weeks + 8 weeks + 8 weeks (recert)  PLANNED INTERVENTIONS: 02835- PT Re-evaluation, 97110-Therapeutic exercises, 97530- Therapeutic activity, 97112- Neuromuscular re-education, 97535- Self Care, 02859- Manual therapy, [phone]- Gait training,  [phone]- Orthotic Fit/training, [phone]- Electrical stimulation (manual), Patient/Family education, Balance training, Taping, Dry Needling, Joint mobilization, Cognitive remediation, DME instructions, Wheelchair mobility training, Cryotherapy, and Moist heat  PLAN FOR NEXT SESSION: Did they work on Paediatric nurse? Work on Oncologist, retro gait, continue gait training w/RW, Sit to stands-from lower surfaces. EMOM training, independence w/transfers, step taps, lateral stepping at ballet  bar again? 180 degree turns with RW   Altonio Schwertner E Nadalee Neiswender, PT, DPT  08/04/2023, 3:32 PM

## 2023-08-09 ENCOUNTER — Ambulatory Visit: Admitting: Physical Therapy

## 2023-08-09 DIAGNOSIS — R2689 Other abnormalities of gait and mobility: Secondary | ICD-10-CM | POA: Diagnosis not present

## 2023-08-09 DIAGNOSIS — R2681 Unsteadiness on feet: Secondary | ICD-10-CM

## 2023-08-09 DIAGNOSIS — M6281 Muscle weakness (generalized): Secondary | ICD-10-CM

## 2023-08-09 DIAGNOSIS — R293 Abnormal posture: Secondary | ICD-10-CM

## 2023-08-09 NOTE — Therapy (Signed)
 OUTPATIENT PHYSICAL THERAPY NEURO TREATMENT  Patient Name: Anthony Downs MRN: [id number] DOB:[birth year], 73 y.o., male Today's Date: 08/09/2023   PCP: Bertell Satterfield, MD REFERRING PROVIDER: Babs Arthea DASEN, MD    END OF SESSION:  PT End of Session - 08/09/23 1321     Visit Number 26    Number of Visits 32   with eval   Date for PT Re-Evaluation 09/20/23   recert   Authorization Type Medicare    PT Start Time 1320    PT Stop Time 1402    PT Time Calculation (min) 42 min    Equipment Utilized During Treatment Gait belt    Activity Tolerance Patient tolerated treatment well    Behavior During Therapy WFL for tasks assessed/performed                Past Medical History:  Diagnosis Date   Acid reflux    Anxiety    Arthritis    Asthma    as a child   Basal cell carcinoma 01/26/2021   RIGHT POST SURICULAR INFERIOR   Basal cell carcinoma 01/26/2021   RIGHT POST AURICULAR SUPERIOR   Benign brain tumor (HCC)    Chest pain    a. 09/2019 MV: No ischemia.   Complication of anesthesia    slow to wake up and pain medicines make him sick   GERD (gastroesophageal reflux disease)    History of DVT (deep vein thrombosis)    History of echocardiogram    a. 09/2019 Echo: EF 60-65%, nl RV fxn; b. 09/2022 Echo: EF of 60-65% without regional wall motion abnormalities, normal RV size/function, and borderline dilatation of the aortic root at 39 mm.   Hypercholesteremia    Hypertension    PONV (postoperative nausea and vomiting)    PVC's (premature ventricular contractions)    a. 08/2022 Zio: predominantly sinus rhythm with an average heart rate of 71 (50-150), 5 brief SVT runs (longest 10.2 seconds with max rate of 150).  3.2% PVC burden was also noted.  No triggered events.   Skin cancer    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, squamous behind ear   Sleep apnea    no Cpap   Subdural hematoma, post-traumatic (HCC) 2008   fell off truck hit head on concrete   Past Surgical  History:  Procedure Laterality Date   APPLICATION OF CRANIAL NAVIGATION Left 04/29/2020   Procedure: APPLICATION OF CRANIAL NAVIGATION;  Surgeon: Cheryle Debby LABOR, MD;  Location: [facility] OR;  Service: Neurosurgery;  Laterality: Left;   arthroscopic knee     COLONOSCOPY N/A 11/29/2018   Procedure: COLONOSCOPY;  Surgeon: Golda Claudis PENNER, MD;  Location: [facility];  Service: Endoscopy;  Laterality: N/A;  730-rescheduled 9/2 same time per Ann   CRANIOTOMY Left 04/29/2020   Procedure: LEFT CRANIECTOMY WITH TUMOR EXCISION;  Surgeon: Cheryle Debby LABOR, MD;  Location: [facility] OR;  Service: Neurosurgery;  Laterality: Left;   KNEE SURGERY Right    NASAL SEPTOPLASTY W/ TURBINOPLASTY Bilateral 03/19/2020   Procedure: NASAL SEPTOPLASTY WITH BILATERAL  TURBINATE REDUCTION;  Surgeon: Karis Clunes, MD;  Location: [facility] OR;  Service: ENT;  Laterality: Bilateral;   VASECTOMY     Patient Active Problem List   Diagnosis Date Noted   Current use of long term anticoagulation 05/06/2022   Seizure (HCC) 08/13/2020   Seizures (HCC) 08/12/2020   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   DVT (deep venous thrombosis) (HCC) 08/12/2020   COVID-19 virus infection 08/12/2020   Acute  lower UTI 07/14/2020   Delirium 07/06/2020   Sleep apnea 07/06/2020   GERD (gastroesophageal reflux disease) 07/06/2020   Tetraplegia (HCC) 06/25/2020   Sundowning 06/25/2020   Slow transit constipation    Essential hypertension    Hypokalemia    Postoperative pain    Meningioma (HCC) 05/02/2020   Brain tumor (HCC) 04/28/2020   S/P nasal septoplasty 03/19/2020   Special screening for malignant neoplasms, colon 01/09/2018    ONSET DATE: 04/03/2023  REFERRING DIAG: G82.50 (ICD-10-CM) - Tetraplegia (HCC) D32.9 (ICD-10-CM) - Meningioma (HCC)  THERAPY DIAG:  Other abnormalities of gait and mobility  Muscle weakness (generalized)  Abnormal posture  Unsteadiness on feet  Rationale for Evaluation and Treatment:  Rehabilitation  SUBJECTIVE:                                                                                                                                                                                             SUBJECTIVE STATEMENT:  Pt presents in WC. Reports doing well, no falls or acute changes to report. Still transferring well at home, plans on trying to walk to shower chair next week.   Pt accompanied by: self and significant other [name]  PERTINENT HISTORY: anxiety, OA, cancer, subdural hematoma   PAIN:  Are you having pain? No  PRECAUTIONS: Fall  RED FLAGS: None   WEIGHT BEARING RESTRICTIONS: No  FALLS: Has patient fallen in last 6 months? No  LIVING ENVIRONMENT: Lives with: lives with their spouse Lives in: House/apartment Home is power wheelchair accessible Has following equipment at home: Wheelchair (power), Ramped entry, and slide board, [name], handicap accessible bathroom, hospital bed  PLOF: Independent with household mobility with device, Needs assistance with ADLs, and Needs assistance with transfers  PATIENT GOALS: stand pivot transfer take steps in // bars  OBJECTIVE:  Note: Objective measures were completed at Evaluation unless otherwise noted.  DIAGNOSTIC FINDINGS: None relevant to this POC  COGNITION: Overall cognitive status: Impaired   SENSATION: No N/T per patient report  EDEMA:  Yes in BLE, wears compression stockings  POSTURE: rounded shoulders, forward head, and posterior pelvic tilt  LOWER EXTREMITY ROM:     Passive  Right Eval Left Eval  Hip flexion Tight hip flexors Tight hip flexors  Hip extension    Hip abduction    Hip adduction    Hip internal rotation    Hip external rotation    Knee flexion    Knee extension Tight HS Tight HS  Ankle dorsiflexion Tight gastroc Tight gastroc  Ankle plantarflexion    Ankle inversion    Ankle eversion     (Blank rows =  not tested)  LOWER EXTREMITY MMT:    MMT  Right Eval Left Eval  Hip flexion 2- 3  Hip extension    Hip abduction    Hip adduction    Hip internal rotation    Hip external rotation    Knee flexion 2- 3  Knee extension 2- 3  Ankle dorsiflexion 0 1  Ankle plantarflexion    Ankle inversion    Ankle eversion    (Blank rows = not tested)  BED MOBILITY:  Sit to supine Mod A Supine to sit Min A Min A for trunk for supine to sit Mod A for BLE for sit to supine  TRANSFERS: Assistive device utilized: RW  Sit to stand: SBA and CGA Stand to sit: CGA Chair to chair: CGA  VITALS  There were no vitals filed for this visit.                                                                                                                              TREATMENT:   NMR TUG trials for improved transfers, stability with turns and endurance w/RW:  Trial 1: 3 min 21s w/SBA only  Trial 2: 2 min 30s w/SBA only  Trial 3: 3 min 35s, min A x2 required for final turn to chair due to fatigue  Avg: 3 min 8s  Pt performed 5 sit to stands w/RW for improved BLE strength and endurance. Pt able to place his feet independently and reset between each repetition independently.  Pt rated RPE of 9/10 at end of session   Self-care/home management  Informed pt that his next PT appointment is not until 2 more weeks so it is imperative that pt work on his exercises at home. Pt continues to be fearful of walking 5-8' to shower chair w/RW due to fear of hurting [name]. Informed pt he does not need physical assist w/this as he is walking >20' at a time now w/SBA only and he has a Stedy at home if he had to use it. [name] in agreement that pt does not need assistance for this, so pt in agreement to try.    PATIENT EDUCATION: Education details: Continue working on stand pivot transfers at home and walking to Tour manager  Person educated: Patient, Animal nutritionist method: Explanation, Demonstration, and Verbal cues Education comprehension: verbalized  understanding and needs further education  HOME EXERCISE PROGRAM: To be initiated as appropriate  GOALS: Goals reviewed with patient? Yes  SHORT TERM GOALS:   Target date: 06/30/2023  Pt will perform stand pivots consistently w/RW and CGA x1 for improved independence and functional mobility  Baseline: CGA-min A x2, CGA with RW (6/12)  Goal status: MET  2.  5x STS to be assessed at STG assessment and LTG written  Baseline: 2:25 min with RW (6/12) Goal status: MET  3.  Pt will ambulate 74' w/RW and min A x1 for improved endurance and functional mobility  Baseline: 81'  w/CGA-min A x2, 38 ft with RW SBA with close w/c follow (6/12) Goal status: IN PROGRESS   LONG TERM GOALS:  Target date: 07/28/2023  Pt will improve 5 x STS to less than or equal to 2 minutes to demonstrate improved functional strength and transfer efficiency.  Baseline: 2:25 min with RW (6/12); 54.78s (7/8) Goal status: MET  2.  Pt will ambulate 65' w/RW and CGA x1 for improved independence and endurance  Baseline: 32' w/CGA-min A x2, 38 ft with RW SBA with close w/c follow (6/12); 58 w/RW and WC follow (7/8) Goal status: IN PROGRESS  NEW SHORT TERM GOALS:   Target date: 08/23/2023  Pt will improve 5 x STS to less than or equal to 45s to demonstrate improved functional strength and transfer efficiency.  Baseline: 54.78s (7/8)  Goal status: INITIAL  2.   Pt will ambulate 45' w/RW and CGA x1 for improved independence and endurance  Baseline: 58 w/RW, CGA x1 and WC follow (7/8) Goal status: INITIAL   NEW LONG TERM GOALS:  Target date: 09/20/2023  Pt will ambulate 50' and full 180 degree turn wRW and CGA x1 for improved endurance and independence in home environment  Baseline:  Goal status: INITIAL  2.  Pt will take shower using shower bench and RW for improved endurance, independence w/ADLs and transfers.  Baseline: currently taking bird baths  Goal status: INITIAL   ASSESSMENT:  CLINICAL  IMPRESSION: Emphasis of skilled PT session on endurance, turns and functional BLE strength. Pt performed 3 trials of TUG today w/an average of and 8s w/RW at SBA level, only requiring assistance on final turn of final rep due to fatigue. Pt overall much more independent w/transfers at home but is fearful of performing transfer to shower chair due to fear of hurting [name]. Informed pt that he is strong enough to perform this transfer at home without assistance and therapists would not recommend he perform it unless it was safe. Pt verbalized understanding and agreement. Continue POC.    OBJECTIVE IMPAIRMENTS: decreased activity tolerance, decreased balance, decreased cognition, decreased endurance, decreased knowledge of condition, decreased knowledge of use of DME, decreased mobility, difficulty walking, decreased ROM, decreased strength, increased edema, and impaired UE functional use.   ACTIVITY LIMITATIONS: carrying, lifting, bending, standing, transfers, and bed mobility  PARTICIPATION LIMITATIONS: meal prep, cleaning, laundry, and community activity  PERSONAL FACTORS: Fitness, Time since onset of injury/illness/exacerbation, and 1-2 comorbidities:   anxiety, OA, cancer, subdural hematoma are also affecting patient's functional outcome.   REHAB POTENTIAL: Good  CLINICAL DECISION MAKING: Stable/uncomplicated  EVALUATION COMPLEXITY: High  PLAN:  PT FREQUENCY: 1-2x/week  PT DURATION: 6 weeks + 8 weeks + 8 weeks (recert)  PLANNED INTERVENTIONS: 02835- PT Re-evaluation, 97110-Therapeutic exercises, 97530- Therapeutic activity, 97112- Neuromuscular re-education, 97535- Self Care, 02859- Manual therapy, [phone]- Gait training, [phone]- Orthotic Fit/training, [phone]- Electrical stimulation (manual), Patient/Family education, Balance training, Taping, Dry Needling, Joint mobilization, Cognitive remediation, DME instructions, Wheelchair mobility training, Cryotherapy, and Moist heat  PLAN FOR  NEXT SESSION: Did they work on Paediatric nurse? Work on Oncologist, retro gait, continue gait training w/RW, Sit to stands-from lower surfaces. EMOM training, independence w/transfers, step taps, lateral stepping at ballet bar again? 180 degree turns with RW   Meredith Mells E Sharlon Pfohl, PT, DPT  08/09/2023, 2:03 PM

## 2023-08-15 ENCOUNTER — Other Ambulatory Visit: Payer: Self-pay | Admitting: Cardiology

## 2023-08-15 MED ORDER — ATORVASTATIN CALCIUM 10 MG PO TABS
10.0000 mg | ORAL_TABLET | Freq: Every day | ORAL | 3 refills | Status: AC
  Filled 2023-09-07: qty 90, 90d supply, fill #0
  Filled 2023-12-04: qty 90, 90d supply, fill #1

## 2023-08-17 ENCOUNTER — Other Ambulatory Visit (HOSPITAL_BASED_OUTPATIENT_CLINIC_OR_DEPARTMENT_OTHER): Payer: Self-pay

## 2023-08-17 ENCOUNTER — Other Ambulatory Visit (HOSPITAL_COMMUNITY): Payer: Self-pay

## 2023-08-17 MED ORDER — FINASTERIDE 5 MG PO TABS
5.0000 mg | ORAL_TABLET | Freq: Every day | ORAL | 3 refills | Status: DC
  Filled 2023-10-25: qty 90, 90d supply, fill #0

## 2023-08-17 MED ORDER — GLIMEPIRIDE 4 MG PO TABS
4.0000 mg | ORAL_TABLET | Freq: Every day | ORAL | 3 refills | Status: DC
  Filled 2023-08-17 – 2023-10-25 (×2): qty 90, 90d supply, fill #0

## 2023-08-17 MED ORDER — EMPAGLIFLOZIN 25 MG PO TABS
25.0000 mg | ORAL_TABLET | Freq: Every day | ORAL | 11 refills | Status: DC
  Filled 2023-08-18: qty 30, 30d supply, fill #0
  Filled 2023-09-22: qty 30, 30d supply, fill #1
  Filled 2023-10-17: qty 30, 30d supply, fill #2
  Filled 2023-11-16: qty 30, 30d supply, fill #3
  Filled 2023-12-19: qty 30, 30d supply, fill #4

## 2023-08-17 MED ORDER — GLUCOSE BLOOD VI STRP
ORAL_STRIP | 3 refills | Status: AC
  Filled 2023-12-04: qty 100, 90d supply, fill #0

## 2023-08-17 MED ORDER — TAMSULOSIN HCL 0.4 MG PO CAPS: 0.4000 mg | ORAL_CAPSULE | Freq: Every day | ORAL | 3 refills | Status: DC

## 2023-08-17 MED ORDER — TAMSULOSIN HCL 0.4 MG PO CAPS
0.4000 mg | ORAL_CAPSULE | Freq: Every day | ORAL | 3 refills | Status: DC
  Filled 2023-10-25: qty 90, 90d supply, fill #0

## 2023-08-17 MED ORDER — PROPRANOLOL HCL 20 MG PO TABS: 40.0000 mg | ORAL_TABLET | Freq: Every day | ORAL | 3 refills | Status: DC

## 2023-08-17 MED ORDER — ALBUTEROL SULFATE 2 MG PO TABS: 2.0000 mg | ORAL_TABLET | Freq: Three times a day (TID) | ORAL | 0 refills | Status: DC

## 2023-08-17 MED ORDER — GLIMEPIRIDE 4 MG PO TABS
8.0000 mg | ORAL_TABLET | Freq: Every day | ORAL | 11 refills | Status: DC
  Filled 2023-09-30: qty 60, 30d supply, fill #0

## 2023-08-18 ENCOUNTER — Other Ambulatory Visit (HOSPITAL_BASED_OUTPATIENT_CLINIC_OR_DEPARTMENT_OTHER): Payer: Self-pay

## 2023-08-23 ENCOUNTER — Ambulatory Visit: Attending: Physical Medicine & Rehabilitation | Admitting: Physical Therapy

## 2023-08-23 DIAGNOSIS — R2681 Unsteadiness on feet: Secondary | ICD-10-CM | POA: Insufficient documentation

## 2023-08-23 DIAGNOSIS — R2689 Other abnormalities of gait and mobility: Secondary | ICD-10-CM | POA: Diagnosis present

## 2023-08-23 DIAGNOSIS — R278 Other lack of coordination: Secondary | ICD-10-CM | POA: Insufficient documentation

## 2023-08-23 DIAGNOSIS — R29898 Other symptoms and signs involving the musculoskeletal system: Secondary | ICD-10-CM | POA: Diagnosis present

## 2023-08-23 DIAGNOSIS — R293 Abnormal posture: Secondary | ICD-10-CM | POA: Diagnosis present

## 2023-08-23 DIAGNOSIS — R29818 Other symptoms and signs involving the nervous system: Secondary | ICD-10-CM | POA: Insufficient documentation

## 2023-08-23 DIAGNOSIS — M6281 Muscle weakness (generalized): Secondary | ICD-10-CM | POA: Insufficient documentation

## 2023-08-23 NOTE — Therapy (Signed)
 OUTPATIENT PHYSICAL THERAPY NEURO TREATMENT  Patient Name: JOHNNEY SCARLATA MRN: [id number] DOB:1950-04-28, 73 y.o., male Today's Date: 08/23/2023   PCP: Bertell Satterfield, MD REFERRING PROVIDER: Babs Arthea DASEN, MD    END OF SESSION:  PT End of Session - 08/23/23 1319     Visit Number 27    Number of Visits 32   with eval   Date for PT Re-Evaluation 09/20/23   recert   Authorization Type Medicare    PT Start Time 1317    PT Stop Time 1401    PT Time Calculation (min) 44 min    Equipment Utilized During Treatment Gait belt    Activity Tolerance Patient tolerated treatment well    Behavior During Therapy WFL for tasks assessed/performed                Past Medical History:  Diagnosis Date   Acid reflux    Anxiety    Arthritis    Asthma    as a child   Basal cell carcinoma 01/26/2021   RIGHT POST SURICULAR INFERIOR   Basal cell carcinoma 01/26/2021   RIGHT POST AURICULAR SUPERIOR   Benign brain tumor (HCC)    Chest pain    a. 09/2019 MV: No ischemia.   Complication of anesthesia    slow to wake up and pain medicines make him sick   GERD (gastroesophageal reflux disease)    History of DVT (deep vein thrombosis)    History of echocardiogram    a. 09/2019 Echo: EF 60-65%, nl RV fxn; b. 09/2022 Echo: EF of 60-65% without regional wall motion abnormalities, normal RV size/function, and borderline dilatation of the aortic root at 39 mm.   Hypercholesteremia    Hypertension    PONV (postoperative nausea and vomiting)    PVC's (premature ventricular contractions)    a. 08/2022 Zio: predominantly sinus rhythm with an average heart rate of 71 (50-150), 5 brief SVT runs (longest 10.2 seconds with max rate of 150).  3.2% PVC burden was also noted.  No triggered events.   Skin cancer    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, squamous behind ear   Sleep apnea    no Cpap   Subdural hematoma, post-traumatic (HCC) 2008   fell off truck hit head on concrete   Past Surgical  History:  Procedure Laterality Date   APPLICATION OF CRANIAL NAVIGATION Left 04/29/2020   Procedure: APPLICATION OF CRANIAL NAVIGATION;  Surgeon: Cheryle Debby LABOR, MD;  Location: [facility] OR;  Service: Neurosurgery;  Laterality: Left;   arthroscopic knee     COLONOSCOPY N/A 11/29/2018   Procedure: COLONOSCOPY;  Surgeon: Golda Claudis PENNER, MD;  Location: [facility];  Service: Endoscopy;  Laterality: N/A;  730-rescheduled 9/2 same time per Ann   CRANIOTOMY Left 04/29/2020   Procedure: LEFT CRANIECTOMY WITH TUMOR EXCISION;  Surgeon: Cheryle Debby LABOR, MD;  Location: [facility] OR;  Service: Neurosurgery;  Laterality: Left;   KNEE SURGERY Right    NASAL SEPTOPLASTY W/ TURBINOPLASTY Bilateral 03/19/2020   Procedure: NASAL SEPTOPLASTY WITH BILATERAL  TURBINATE REDUCTION;  Surgeon: Karis Clunes, MD;  Location: [facility] OR;  Service: ENT;  Laterality: Bilateral;   VASECTOMY     Patient Active Problem List   Diagnosis Date Noted   Current use of long term anticoagulation 05/06/2022   Seizure (HCC) 08/13/2020   Seizures (HCC) 08/12/2020   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   DVT (deep venous thrombosis) (HCC) 08/12/2020   COVID-19 virus infection 08/12/2020   Acute  lower UTI 07/14/2020   Delirium 07/06/2020   Sleep apnea 07/06/2020   GERD (gastroesophageal reflux disease) 07/06/2020   Tetraplegia (HCC) 06/25/2020   Sundowning 06/25/2020   Slow transit constipation    Essential hypertension    Hypokalemia    Postoperative pain    Meningioma (HCC) 05/02/2020   Brain tumor (HCC) 04/28/2020   S/P nasal septoplasty 03/19/2020   Special screening for malignant neoplasms, colon 01/09/2018    ONSET DATE: 04/03/2023  REFERRING DIAG: G82.50 (ICD-10-CM) - Tetraplegia (HCC) D32.9 (ICD-10-CM) - Meningioma (HCC)  THERAPY DIAG:  Other abnormalities of gait and mobility  Muscle weakness (generalized)  Abnormal posture  Unsteadiness on feet  Rationale for Evaluation and Treatment:  Rehabilitation  SUBJECTIVE:                                                                                                                                                                                             SUBJECTIVE STATEMENT:  Pt presents in WC. Reports doing well, no falls or acute changes to report. Has walked to and from the bedroom w/his RW several times and walked to the shower 2x. [name] reports pt was able to pop up from the shower chair without difficulty. Pt reports he is still fearful taking a shower due to fear of hurting [name].   Pt accompanied by: self and significant other [name]  PERTINENT HISTORY: anxiety, OA, cancer, subdural hematoma   PAIN:  Are you having pain? No  PRECAUTIONS: Fall  RED FLAGS: None   WEIGHT BEARING RESTRICTIONS: No  FALLS: Has patient fallen in last 6 months? No  LIVING ENVIRONMENT: Lives with: lives with their spouse Lives in: House/apartment Home is power wheelchair accessible Has following equipment at home: Wheelchair (power), Ramped entry, and slide board, [name], handicap accessible bathroom, hospital bed  PLOF: Independent with household mobility with device, Needs assistance with ADLs, and Needs assistance with transfers  PATIENT GOALS: stand pivot transfer take steps in // bars  OBJECTIVE:  Note: Objective measures were completed at Evaluation unless otherwise noted.  DIAGNOSTIC FINDINGS: None relevant to this POC  COGNITION: Overall cognitive status: Impaired   SENSATION: No N/T per patient report  EDEMA:  Yes in BLE, wears compression stockings  POSTURE: rounded shoulders, forward head, and posterior pelvic tilt  LOWER EXTREMITY ROM:     Passive  Right Eval Left Eval  Hip flexion Tight hip flexors Tight hip flexors  Hip extension    Hip abduction    Hip adduction    Hip internal rotation    Hip external rotation    Knee flexion    Knee  extension Tight HS Tight HS  Ankle dorsiflexion  Tight gastroc Tight gastroc  Ankle plantarflexion    Ankle inversion    Ankle eversion     (Blank rows = not tested)  LOWER EXTREMITY MMT:    MMT Right Eval Left Eval  Hip flexion 2- 3  Hip extension    Hip abduction    Hip adduction    Hip internal rotation    Hip external rotation    Knee flexion 2- 3  Knee extension 2- 3  Ankle dorsiflexion 0 1  Ankle plantarflexion    Ankle inversion    Ankle eversion    (Blank rows = not tested)  BED MOBILITY:  Sit to supine Mod A Supine to sit Min A Min A for trunk for supine to sit Mod A for BLE for sit to supine  TRANSFERS: Assistive device utilized: RW  Sit to stand: SBA and CGA Stand to sit: CGA Chair to chair: CGA  VITALS  There were no vitals filed for this visit.                                                                                                                              TREATMENT:   Ther Act - STG Assessment   [facility] PT Assessment - 08/23/23 1329       Transfers   Five time sit to stand comments  38.5s   BUE support, w/RW        Gait pattern: step through pattern, decreased hip/knee flexion- Right, decreased ankle dorsiflexion- Right, lateral hip instability, trunk flexed, and poor foot clearance- Right Distance walked: 61', 79' and 36'  Assistive device utilized: Environmental consultant - 2 wheeled Level of assistance: SBA and WC follow Comments: Pt performed sit to stands this date w/SBA only. Min cues for upright posture throughout as pt tends to flex forward w/fatigue. Pt required 3-5 minute rest breaks in between gait trials.     PATIENT EDUCATION: Education details: Goal results, continue to work on walking at home  Person educated: Patient, [name] Education method: Explanation, Demonstration, and Verbal cues Education comprehension: verbalized understanding and needs further education  HOME EXERCISE PROGRAM: To be initiated as appropriate  GOALS: Goals reviewed with patient? Yes   LONG TERM  GOALS:  Target date: 07/28/2023  Pt will improve 5 x STS to less than or equal to 2 minutes to demonstrate improved functional strength and transfer efficiency.  Baseline: 2:25 min with RW (6/12); 54.78s (7/8) Goal status: MET  2.  Pt will ambulate 6' w/RW and CGA x1 for improved independence and endurance  Baseline: 5' w/CGA-min A x2, 38 ft with RW SBA with close w/c follow (6/12); 58 w/RW and WC follow (7/8) Goal status: IN PROGRESS  NEW SHORT TERM GOALS:   Target date: 08/23/2023  Pt will improve 5 x STS to less than or equal to 45s to demonstrate improved functional strength and transfer efficiency.  Baseline: 54.78s (7/8);  38.5s (8/5) Goal status: MET  2.   Pt will ambulate 31' w/RW and CGA x1 for improved independence and endurance  Baseline: 58 w/RW, CGA x1 and WC follow (7/8); 46' w/SBA and WC follow on 8/5 Goal status: IN PROGRESS   NEW LONG TERM GOALS:  Target date: 09/20/2023  Pt will ambulate 50' and full 180 degree turn wRW and CGA x1 for improved endurance and independence in home environment  Baseline:  Goal status: INITIAL  2.  Pt will take shower using shower bench and RW for improved endurance, independence w/ADLs and transfers.  Baseline: currently taking bird baths  Goal status: INITIAL   ASSESSMENT:  CLINICAL IMPRESSION: Emphasis of skilled PT session on STG assessment and gait training. Pt performed 5x STS in 38.5s, meeting his STG of <45s. Pt continues to progress towards his gait goal of 70', walking up to 46' at a time today w/RW and SBA. Pt continues to be self-limiting w/gait and will request to sit prior to needing to sit down, requiring encouragement to push himself at times. Pt has been walking more at home, from bedroom door to his bed and into his bathroom a couple of times. Continue POC.    OBJECTIVE IMPAIRMENTS: decreased activity tolerance, decreased balance, decreased cognition, decreased endurance, decreased knowledge of condition, decreased  knowledge of use of DME, decreased mobility, difficulty walking, decreased ROM, decreased strength, increased edema, and impaired UE functional use.   ACTIVITY LIMITATIONS: carrying, lifting, bending, standing, transfers, and bed mobility  PARTICIPATION LIMITATIONS: meal prep, cleaning, laundry, and community activity  PERSONAL FACTORS: Fitness, Time since onset of injury/illness/exacerbation, and 1-2 comorbidities:   anxiety, OA, cancer, subdural hematoma are also affecting patient's functional outcome.   REHAB POTENTIAL: Good  CLINICAL DECISION MAKING: Stable/uncomplicated  EVALUATION COMPLEXITY: High  PLAN:  PT FREQUENCY: 1-2x/week  PT DURATION: 6 weeks + 8 weeks + 8 weeks (recert)  PLANNED INTERVENTIONS: 02835- PT Re-evaluation, 97110-Therapeutic exercises, 97530- Therapeutic activity, 97112- Neuromuscular re-education, 97535- Self Care, 02859- Manual therapy, [phone]- Gait training, [phone]- Orthotic Fit/training, [phone]- Electrical stimulation (manual), Patient/Family education, Balance training, Taping, Dry Needling, Joint mobilization, Cognitive remediation, DME instructions, Wheelchair mobility training, Cryotherapy, and Moist heat  PLAN FOR NEXT SESSION: Did they work on Paediatric nurse? Work on Oncologist, retro gait, continue gait training w/RW, Sit to stands-from lower surfaces. EMOM training, independence w/transfers, step taps, lateral stepping at ballet bar again? 180 degree turns with RW   Laportia Carley E Kenna Seward, PT, DPT  08/23/2023, 2:02 PM

## 2023-08-30 ENCOUNTER — Ambulatory Visit: Admitting: Physical Therapy

## 2023-08-30 DIAGNOSIS — R2689 Other abnormalities of gait and mobility: Secondary | ICD-10-CM

## 2023-08-30 DIAGNOSIS — M6281 Muscle weakness (generalized): Secondary | ICD-10-CM

## 2023-08-30 DIAGNOSIS — R2681 Unsteadiness on feet: Secondary | ICD-10-CM

## 2023-08-30 DIAGNOSIS — R293 Abnormal posture: Secondary | ICD-10-CM

## 2023-08-30 NOTE — Therapy (Signed)
 OUTPATIENT PHYSICAL THERAPY NEURO TREATMENT  Patient Name: Anthony Downs MRN: [id number] DOB:27-Feb-1950, 73 y.o., male Today's Date: 08/30/2023   PCP: Bertell Satterfield, MD REFERRING PROVIDER: Babs Arthea DASEN, MD    END OF SESSION:  PT End of Session - 08/30/23 1321     Visit Number 28    Number of Visits 32   with eval   Date for PT Re-Evaluation 09/20/23   recert   Authorization Type Medicare    PT Start Time 1319    PT Stop Time 1400    PT Time Calculation (min) 41 min    Equipment Utilized During Treatment Gait belt    Activity Tolerance Patient tolerated treatment well    Behavior During Therapy WFL for tasks assessed/performed                Past Medical History:  Diagnosis Date   Acid reflux    Anxiety    Arthritis    Asthma    as a child   Basal cell carcinoma 01/26/2021   RIGHT POST SURICULAR INFERIOR   Basal cell carcinoma 01/26/2021   RIGHT POST AURICULAR SUPERIOR   Benign brain tumor (HCC)    Chest pain    a. 09/2019 MV: No ischemia.   Complication of anesthesia    slow to wake up and pain medicines make him sick   GERD (gastroesophageal reflux disease)    History of DVT (deep vein thrombosis)    History of echocardiogram    a. 09/2019 Echo: EF 60-65%, nl RV fxn; b. 09/2022 Echo: EF of 60-65% without regional wall motion abnormalities, normal RV size/function, and borderline dilatation of the aortic root at 39 mm.   Hypercholesteremia    Hypertension    PONV (postoperative nausea and vomiting)    PVC's (premature ventricular contractions)    a. 08/2022 Zio: predominantly sinus rhythm with an average heart rate of 71 (50-150), 5 brief SVT runs (longest 10.2 seconds with max rate of 150).  3.2% PVC burden was also noted.  No triggered events.   Skin cancer    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, squamous behind ear   Sleep apnea    no Cpap   Subdural hematoma, post-traumatic (HCC) 2008   fell off truck hit head on concrete   Past Surgical  History:  Procedure Laterality Date   APPLICATION OF CRANIAL NAVIGATION Left 04/29/2020   Procedure: APPLICATION OF CRANIAL NAVIGATION;  Surgeon: Cheryle Debby LABOR, MD;  Location: [facility] OR;  Service: Neurosurgery;  Laterality: Left;   arthroscopic knee     COLONOSCOPY N/A 11/29/2018   Procedure: COLONOSCOPY;  Surgeon: Golda Claudis PENNER, MD;  Location: [facility];  Service: Endoscopy;  Laterality: N/A;  730-rescheduled 9/2 same time per Ann   CRANIOTOMY Left 04/29/2020   Procedure: LEFT CRANIECTOMY WITH TUMOR EXCISION;  Surgeon: Cheryle Debby LABOR, MD;  Location: [facility] OR;  Service: Neurosurgery;  Laterality: Left;   KNEE SURGERY Right    NASAL SEPTOPLASTY W/ TURBINOPLASTY Bilateral 03/19/2020   Procedure: NASAL SEPTOPLASTY WITH BILATERAL  TURBINATE REDUCTION;  Surgeon: Karis Clunes, MD;  Location: [facility] OR;  Service: ENT;  Laterality: Bilateral;   VASECTOMY     Patient Active Problem List   Diagnosis Date Noted   Current use of long term anticoagulation 05/06/2022   Seizure (HCC) 08/13/2020   Seizures (HCC) 08/12/2020   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   DVT (deep venous thrombosis) (HCC) 08/12/2020   COVID-19 virus infection 08/12/2020   Acute  lower UTI 07/14/2020   Delirium 07/06/2020   Sleep apnea 07/06/2020   GERD (gastroesophageal reflux disease) 07/06/2020   Tetraplegia (HCC) 06/25/2020   Sundowning 06/25/2020   Slow transit constipation    Essential hypertension    Hypokalemia    Postoperative pain    Meningioma (HCC) 05/02/2020   Brain tumor (HCC) 04/28/2020   S/P nasal septoplasty 03/19/2020   Special screening for malignant neoplasms, colon 01/09/2018    ONSET DATE: 04/03/2023  REFERRING DIAG: G82.50 (ICD-10-CM) - Tetraplegia (HCC) D32.9 (ICD-10-CM) - Meningioma (HCC)  THERAPY DIAG:  Other abnormalities of gait and mobility  Muscle weakness (generalized)  Abnormal posture  Unsteadiness on feet  Rationale for Evaluation and Treatment:  Rehabilitation  SUBJECTIVE:                                                                                                                                                                                             SUBJECTIVE STATEMENT:  Pt presents in WC. Reports doing well, no falls or acute changes to report. Still walking in the house when able. Was able to walk for his brother today, which he is emotional about. [name] ordered a transport chair that is 19 wide and is too tight for pt's hips.   Pt accompanied by: self and significant other [name]  PERTINENT HISTORY: anxiety, OA, cancer, subdural hematoma   PAIN:  Are you having pain? No  PRECAUTIONS: Fall  RED FLAGS: None   WEIGHT BEARING RESTRICTIONS: No  FALLS: Has patient fallen in last 6 months? No  LIVING ENVIRONMENT: Lives with: lives with their spouse Lives in: House/apartment Home is power wheelchair accessible Has following equipment at home: Wheelchair (power), Ramped entry, and slide board, [name], handicap accessible bathroom, hospital bed  PLOF: Independent with household mobility with device, Needs assistance with ADLs, and Needs assistance with transfers  PATIENT GOALS: stand pivot transfer take steps in // bars  OBJECTIVE:  Note: Objective measures were completed at Evaluation unless otherwise noted.  DIAGNOSTIC FINDINGS: None relevant to this POC  COGNITION: Overall cognitive status: Impaired   SENSATION: No N/T per patient report  EDEMA:  Yes in BLE, wears compression stockings  POSTURE: rounded shoulders, forward head, and posterior pelvic tilt  LOWER EXTREMITY ROM:     Passive  Right Eval Left Eval  Hip flexion Tight hip flexors Tight hip flexors  Hip extension    Hip abduction    Hip adduction    Hip internal rotation    Hip external rotation    Knee flexion    Knee extension Tight HS Tight HS  Ankle dorsiflexion Tight gastroc  Tight gastroc  Ankle plantarflexion     Ankle inversion    Ankle eversion     (Blank rows = not tested)  LOWER EXTREMITY MMT:    MMT Right Eval Left Eval  Hip flexion 2- 3  Hip extension    Hip abduction    Hip adduction    Hip internal rotation    Hip external rotation    Knee flexion 2- 3  Knee extension 2- 3  Ankle dorsiflexion 0 1  Ankle plantarflexion    Ankle inversion    Ankle eversion    (Blank rows = not tested)  BED MOBILITY:  Sit to supine Mod A Supine to sit Min A Min A for trunk for supine to sit Mod A for BLE for sit to supine  TRANSFERS: Assistive device utilized: RW  Sit to stand: SBA and CGA Stand to sit: CGA Chair to chair: CGA  VITALS  There were no vitals filed for this visit.                                                                                                                              TREATMENT:  NMR  Gait pattern: step through pattern, decreased hip/knee flexion- Right, decreased ankle dorsiflexion- Right, lateral hip instability, trunk flexed, and poor foot clearance- Right Distance walked: 48', 31' and 25'  Assistive device utilized: Environmental consultant - 2 wheeled Level of assistance: SBA and WC follow Comments: Pt performed sit to stands this date w/distant SBA only. Min cues for upright posture throughout as pt tends to flex forward w/fatigue. Pt w/improved drive to walk further today, requiring reduced external cues to motivate him to walk further. Pt also able to walk and speak this date, even on final gait trial, indicative of improved endurance.     PATIENT EDUCATION: Education details: Continue to work on walking and endurance at home  Person educated: Patient, [name] Education method: Explanation, Demonstration, and Verbal cues Education comprehension: verbalized understanding and needs further education  HOME EXERCISE PROGRAM: To be initiated as appropriate  GOALS: Goals reviewed with patient? Yes   LONG TERM GOALS:  Target date: 07/28/2023  Pt will improve  5 x STS to less than or equal to 2 minutes to demonstrate improved functional strength and transfer efficiency.  Baseline: 2:25 min with RW (6/12); 54.78s (7/8) Goal status: MET  2.  Pt will ambulate 53' w/RW and CGA x1 for improved independence and endurance  Baseline: 66' w/CGA-min A x2, 38 ft with RW SBA with close w/c follow (6/12); 58 w/RW and WC follow (7/8) Goal status: IN PROGRESS  NEW SHORT TERM GOALS:   Target date: 08/23/2023  Pt will improve 5 x STS to less than or equal to 45s to demonstrate improved functional strength and transfer efficiency.  Baseline: 54.78s (7/8); 38.5s (8/5) Goal status: MET  2.   Pt will ambulate 52' w/RW and CGA x1 for improved independence and endurance  Baseline: 58  w/RW, CGA x1 and WC follow (7/8); 46' w/SBA and WC follow on 8/5 Goal status: IN PROGRESS   NEW LONG TERM GOALS:  Target date: 09/20/2023  Pt will ambulate 50' and full 180 degree turn wRW and CGA x1 for improved endurance and independence in home environment  Baseline:  Goal status: INITIAL  2.  Pt will take shower using shower bench and RW for improved endurance, independence w/ADLs and transfers.  Baseline: currently taking bird baths  Goal status: INITIAL   ASSESSMENT:  CLINICAL IMPRESSION: Emphasis of skilled PT session on gait training w/emphasis on endurance. Pt able to motivate himself to walk further today for first time, but continues to be self-limiting at home. Pt making several comments regarding want for therapist to hold onto him despite pt not needing assistance for transfers or gait. Pt encouraged to continue working on walking at home for endurance and improved confidence in ability. Pt did walk up to 63' today, which continues to be pt's longest continuous distance. Continue POC.    OBJECTIVE IMPAIRMENTS: decreased activity tolerance, decreased balance, decreased cognition, decreased endurance, decreased knowledge of condition, decreased knowledge of use of DME,  decreased mobility, difficulty walking, decreased ROM, decreased strength, increased edema, and impaired UE functional use.   ACTIVITY LIMITATIONS: carrying, lifting, bending, standing, transfers, and bed mobility  PARTICIPATION LIMITATIONS: meal prep, cleaning, laundry, and community activity  PERSONAL FACTORS: Fitness, Time since onset of injury/illness/exacerbation, and 1-2 comorbidities:   anxiety, OA, cancer, subdural hematoma are also affecting patient's functional outcome.   REHAB POTENTIAL: Good  CLINICAL DECISION MAKING: Stable/uncomplicated  EVALUATION COMPLEXITY: High  PLAN:  PT FREQUENCY: 1-2x/week  PT DURATION: 6 weeks + 8 weeks + 8 weeks (recert)  PLANNED INTERVENTIONS: 02835- PT Re-evaluation, 97110-Therapeutic exercises, 97530- Therapeutic activity, 97112- Neuromuscular re-education, 97535- Self Care, 02859- Manual therapy, [phone]- Gait training, [phone]- Orthotic Fit/training, [phone]- Electrical stimulation (manual), Patient/Family education, Balance training, Taping, Dry Needling, Joint mobilization, Cognitive remediation, DME instructions, Wheelchair mobility training, Cryotherapy, and Moist heat  PLAN FOR NEXT SESSION: Did they work on Paediatric nurse? Work on Oncologist, retro gait, continue gait training w/RW, Sit to stands-from lower surfaces. EMOM training, independence w/transfers, step taps, lateral stepping at ballet bar again? 180 degree turns with RW   Nan Maya E Eola Waldrep, PT, DPT  08/30/2023, 2:09 PM

## 2023-08-31 ENCOUNTER — Encounter: Payer: Self-pay | Admitting: Pharmacist

## 2023-08-31 ENCOUNTER — Other Ambulatory Visit (HOSPITAL_BASED_OUTPATIENT_CLINIC_OR_DEPARTMENT_OTHER): Payer: Self-pay

## 2023-08-31 ENCOUNTER — Other Ambulatory Visit (HOSPITAL_COMMUNITY): Payer: Self-pay

## 2023-08-31 ENCOUNTER — Encounter: Attending: Physical Medicine & Rehabilitation | Admitting: Physical Medicine & Rehabilitation

## 2023-08-31 ENCOUNTER — Encounter: Payer: Self-pay | Admitting: Physical Medicine & Rehabilitation

## 2023-08-31 ENCOUNTER — Other Ambulatory Visit: Payer: Self-pay

## 2023-08-31 VITALS — BP 127/80 | HR 65

## 2023-08-31 DIAGNOSIS — R251 Tremor, unspecified: Secondary | ICD-10-CM | POA: Diagnosis present

## 2023-08-31 DIAGNOSIS — G8250 Quadriplegia, unspecified: Secondary | ICD-10-CM | POA: Diagnosis not present

## 2023-08-31 DIAGNOSIS — D329 Benign neoplasm of meninges, unspecified: Secondary | ICD-10-CM | POA: Insufficient documentation

## 2023-08-31 DIAGNOSIS — K5901 Slow transit constipation: Secondary | ICD-10-CM | POA: Diagnosis present

## 2023-08-31 DIAGNOSIS — G969 Disorder of central nervous system, unspecified: Secondary | ICD-10-CM | POA: Insufficient documentation

## 2023-08-31 MED ORDER — PRIMIDONE 125 MG PO TABS
125.0000 mg | ORAL_TABLET | Freq: Every day | ORAL | 4 refills | Status: DC
  Filled 2023-08-31 (×3): qty 30, 30d supply, fill #0

## 2023-08-31 NOTE — Progress Notes (Signed)
 Subjective:    Patient ID: Anthony Downs, male    DOB: Aug 27, 1950, 73 y.o.   MRN: [id number]  HPI Anthony Downs is here in follow-up of his tetraplegia and associated mobility deficits.  He was busy with therapy this summer and actually just had a visit yesterday with PT. He is now walking with a RW!  He has been able to walk household distances including from the living room to his bedroom. He is able to transfer in and out of the bed!  He does report a feeling of tightness in his right lower extremity.  His edema, however has been better controlled and therapy has not noticed any difference in size in his legs.  He continues to deal with a tremor primarily in his right upper extremity.  We did placed him on Sinemet  after his last visit but he experienced sedation with this so we stopped it.  He still takes propranolol  but only twice daily now due to his asthma.  Mood has remained fairly upbeat.  Denies depression.  Sleep is improved.  He generally sleeps from 8 or 9:00 till 8 AM the following morning.  Some of the time may include watching TV and settling into bed.SABRA  His wife has a bowel program that is fairly functional for him now including daily MiraLAX  and Metamucil at small doses in addition to 2 Senokot S's twice daily    Pain Inventory Average Pain 0 Pain Right Now 0 My pain is N/A  In the last 24 hours, has pain interfered with the following? General activity 0 Relation with others 0 Enjoyment of life 0 What TIME of day is your pain at its worst? No pain Sleep (in general) Good  Pain is worse with: N/A Pain improves with: N/A Relief from Meds: N/A  Family History  Problem Relation Age of Onset   Breast cancer Mother    Pancreatic cancer Mother    Aortic aneurysm Father    Social History   Socioeconomic History   Marital status: Married    Spouse name: Not on file   Number of children: 2   Years of education: 12   Highest education level: High school  graduate  Occupational History   Occupation: Retired  Tobacco Use   Smoking status: Never   Smokeless tobacco: Never  Vaping Use   Vaping status: Never Used  Substance and Sexual Activity   Alcohol  use: No    Comment: no use since 2008   Drug use: No   Sexual activity: Yes  Other Topics Concern   Not on file  Social History Narrative   Lives with wife.   Right-handed.   Two cans Dr. Nunzio daily.   Social Drivers of Corporate investment banker Strain: High Risk (07/01/2020)   Received [facility]   Overall Financial Resource Strain (CARDIA)    Difficulty of Paying Living Expenses: Very hard  Food Insecurity: Not on file  Transportation Needs: No Transportation Needs (07/01/2020)   Received from [facility]   PRAPARE - Transportation    Lack of Transportation (Medical): No    Lack of Transportation (Non-Medical): No  Physical Activity: Sufficiently Active (07/14/2017)   Received from [facility]   Exercise Vital Sign    Days of Exercise per Week: 3 days    Minutes of Exercise per Session: 70 min  Stress: Not on file  Social Connections: Not on file   Past Surgical History:  Procedure Laterality  Date   APPLICATION OF CRANIAL NAVIGATION Left 04/29/2020   Procedure: APPLICATION OF CRANIAL NAVIGATION;  Surgeon: Cheryle Debby LABOR, MD;  Location: [facility] OR;  Service: Neurosurgery;  Laterality: Left;   arthroscopic knee     COLONOSCOPY N/A 11/29/2018   Procedure: COLONOSCOPY;  Surgeon: Golda Claudis PENNER, MD;  Location: [facility];  Service: Endoscopy;  Laterality: N/A;  730-rescheduled 9/2 same time per Ann   CRANIOTOMY Left 04/29/2020   Procedure: LEFT CRANIECTOMY WITH TUMOR EXCISION;  Surgeon: Cheryle Debby LABOR, MD;  Location: [facility] OR;  Service: Neurosurgery;  Laterality: Left;   KNEE SURGERY Right    NASAL SEPTOPLASTY W/ TURBINOPLASTY Bilateral 03/19/2020   Procedure: NASAL SEPTOPLASTY WITH BILATERAL  TURBINATE REDUCTION;  Surgeon: Karis Clunes, MD;  Location:  [facility] OR;  Service: ENT;  Laterality: Bilateral;   VASECTOMY     Past Surgical History:  Procedure Laterality Date   APPLICATION OF CRANIAL NAVIGATION Left 04/29/2020   Procedure: APPLICATION OF CRANIAL NAVIGATION;  Surgeon: Cheryle Debby LABOR, MD;  Location: [facility] OR;  Service: Neurosurgery;  Laterality: Left;   arthroscopic knee     COLONOSCOPY N/A 11/29/2018   Procedure: COLONOSCOPY;  Surgeon: Golda Claudis PENNER, MD;  [facility]: [facility];  Service: Endoscopy;  Laterality: N/A;  730-rescheduled 9/2 same time per Ann   CRANIOTOMY Left 04/29/2020   Procedure: LEFT CRANIECTOMY WITH TUMOR EXCISION;  Surgeon: Cheryle Debby LABOR, MD;  Location: [facility] OR;  Service: Neurosurgery;  Laterality: Left;   KNEE SURGERY Right    NASAL SEPTOPLASTY W/ TURBINOPLASTY Bilateral 03/19/2020   Procedure: NASAL SEPTOPLASTY WITH BILATERAL  TURBINATE REDUCTION;  Surgeon: Karis Clunes, MD;  Location: [facility] OR;  Service: ENT;  Laterality: Bilateral;   VASECTOMY     Past Medical History:  Diagnosis Date   Acid reflux    Anxiety    Arthritis    Asthma    as a child   Basal cell carcinoma 01/26/2021   RIGHT POST SURICULAR INFERIOR   Basal cell carcinoma 01/26/2021   RIGHT POST AURICULAR SUPERIOR   Benign brain tumor (HCC)    Chest pain    a. 09/2019 MV: No ischemia.   Complication of anesthesia    slow to wake up and pain medicines make him sick   GERD (gastroesophageal reflux disease)    History of DVT (deep vein thrombosis)    History of echocardiogram    a. 09/2019 Echo: EF 60-65%, nl RV fxn; b. 09/2022 Echo: EF of 60-65% without regional wall motion abnormalities, normal RV size/function, and borderline dilatation of the aortic root at 39 mm.   Hypercholesteremia    Hypertension    PONV (postoperative nausea and vomiting)    PVC's (premature ventricular contractions)    a. 08/2022 Zio: predominantly sinus rhythm with an average heart rate of 71 (50-150), 5 brief SVT runs (longest 10.2 seconds with max rate of 150).   3.2% PVC burden was also noted.  No triggered events.   Skin cancer    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, squamous behind ear   Sleep apnea    no Cpap   Subdural hematoma, post-traumatic (HCC) 2008   fell off truck hit head on concrete   BP 127/80 (BP Location: Left Arm, Patient Position: Sitting, Cuff Size: Large)   Pulse 65   SpO2 93%   Opioid Risk Score:   Fall Risk Score:  `1  Depression screen [facility] 2/9     08/31/2023   11:42 AM 04/20/2023  11:28 AM 11/17/2022    8:40 AM 04/21/2022    2:13 PM 12/16/2021    2:12 PM 09/09/2021    2:43 PM 03/04/2021    2:35 PM  Depression screen PHQ 2/9  Decreased Interest 0 0 0 0 0 0 0  Down, Depressed, Hopeless 0 0 0 0 0 0 0  PHQ - 2 Score 0 0 0 0 0 0 0     Review of Systems  Respiratory:  Positive for apnea.        Sleep apnea  Cardiovascular:        Hypertension  Gastrointestinal:        GERD  Neurological:  Positive for weakness.       Objective:   Physical Exam  General: No acute distress HEENT: NCAT, EOMI, oral membranes moist Cards: reg rate  Chest: normal effort Abdomen: Soft, NT, ND Skin: dry, intact Extremities: trace BLE edema Psych: pleasant and appropriate  Skin: intact Neuro:very alert and oriented. Very attentive and engaging. Insight  and awareness continue to improve.  Upper extremity strength grossly  4 out of 5 on left, RUE 3/5 with tremor. Some limits in ROM. LLE 3- to 3+/5.with KE/KF, 3-/5  ADF/PF.  RLE 3/5 prox to distal with decreased LT on that side.    Reflexes are still 3+. Intentional tremor in UE R>L remains Musculoskeletal:  good sitting posture. Right UE tight with flexion/rotation.          Assessment & Plan:  1. Incomplete tetraplegia with cognitive/linguistic deficits due to bilateral frontal grade 1 meningiomas status post tumor resection on April 29, 2020             -continue with outpt therapies. Might be an aquatic candidate?    2.  Left posterior tibial vein DVT:   now on coumadin              -recent dopplers clear 3.  Sleep disorder/OSA             -continue CPAP 4.  Neurogenic bowel:             -recent program has been effective             - metamucil once daily, miralax  once daily  -Continue senokot-s 2 tabs 2x daily             - suppository or dig stim on toilet if needed 5.  Urinary frequency with incontinence: frequent UTI's            -per urology            -flomax              -he remains continent 6.Seizures: off depakote !!!! 7.Tremor RUE>LUE:               -propranolol  20mg  bid              -has family hx of familial tremors             -begin trial of primidone  125mg  qhs 8. Swelling: he and his wife manage the best they can             -lasix  per primary             -TEDS, elevation while in chair   20 minutes of face to face patient care time were spent during this visit. All questions were encouraged and answered. Follow up with me in 4 months

## 2023-08-31 NOTE — Patient Instructions (Signed)
 ALWAYS FEEL FREE TO CALL OUR OFFICE WITH ANY PROBLEMS OR QUESTIONS [phone])  **PLEASE NOTE** ALL MEDICATION REFILL REQUESTS (INCLUDING CONTROLLED SUBSTANCES) NEED TO BE MADE AT LEAST 7 DAYS PRIOR TO REFILL BEING DUE. ANY REFILL REQUESTS INSIDE THAT TIME FRAME MAY RESULT IN DELAYS IN RECEIVING YOUR PRESCRIPTION.

## 2023-09-02 ENCOUNTER — Other Ambulatory Visit (HOSPITAL_COMMUNITY): Payer: Self-pay

## 2023-09-05 ENCOUNTER — Other Ambulatory Visit: Payer: Self-pay

## 2023-09-06 ENCOUNTER — Ambulatory Visit: Admitting: Physical Therapy

## 2023-09-06 DIAGNOSIS — M6281 Muscle weakness (generalized): Secondary | ICD-10-CM

## 2023-09-06 DIAGNOSIS — R2689 Other abnormalities of gait and mobility: Secondary | ICD-10-CM

## 2023-09-06 DIAGNOSIS — R278 Other lack of coordination: Secondary | ICD-10-CM

## 2023-09-06 NOTE — Therapy (Signed)
 OUTPATIENT PHYSICAL THERAPY NEURO TREATMENT  Patient Name: Anthony Downs MRN: [id number] DOB:October 31, 1950, 73 y.o., male Today's Date: 09/06/2023   PCP: Bertell Satterfield, MD REFERRING PROVIDER: Babs Arthea DASEN, MD    END OF SESSION:  PT End of Session - 09/06/23 1319     Visit Number 29    Number of Visits 32   with eval   Date for PT Re-Evaluation 09/20/23   recert   Authorization Type Medicare    PT Start Time 1318    PT Stop Time 1402    PT Time Calculation (min) 44 min    Equipment Utilized During Treatment Gait belt    Activity Tolerance Patient tolerated treatment well    Behavior During Therapy WFL for tasks assessed/performed            Past Medical History:  Diagnosis Date   Acid reflux    Anxiety    Arthritis    Asthma    as a child   Basal cell carcinoma 01/26/2021   RIGHT POST SURICULAR INFERIOR   Basal cell carcinoma 01/26/2021   RIGHT POST AURICULAR SUPERIOR   Benign brain tumor (HCC)    Chest pain    a. 09/2019 MV: No ischemia.   Complication of anesthesia    slow to wake up and pain medicines make him sick   GERD (gastroesophageal reflux disease)    History of DVT (deep vein thrombosis)    History of echocardiogram    a. 09/2019 Echo: EF 60-65%, nl RV fxn; b. 09/2022 Echo: EF of 60-65% without regional wall motion abnormalities, normal RV size/function, and borderline dilatation of the aortic root at 39 mm.   Hypercholesteremia    Hypertension    PONV (postoperative nausea and vomiting)    PVC's (premature ventricular contractions)    a. 08/2022 Zio: predominantly sinus rhythm with an average heart rate of 71 (50-150), 5 brief SVT runs (longest 10.2 seconds with max rate of 150).  3.2% PVC burden was also noted.  No triggered events.   Skin cancer    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, squamous behind ear   Sleep apnea    no Cpap   Subdural hematoma, post-traumatic (HCC) 2008   fell off truck hit head on concrete   Past Surgical History:   Procedure Laterality Date   APPLICATION OF CRANIAL NAVIGATION Left 04/29/2020   Procedure: APPLICATION OF CRANIAL NAVIGATION;  Surgeon: Cheryle Debby LABOR, MD;  Location: [facility] OR;  Service: Neurosurgery;  Laterality: Left;   arthroscopic knee     COLONOSCOPY N/A 11/29/2018   Procedure: COLONOSCOPY;  Surgeon: Golda Claudis PENNER, MD;  Location: [facility];  Service: Endoscopy;  Laterality: N/A;  730-rescheduled 9/2 same time per Ann   CRANIOTOMY Left 04/29/2020   Procedure: LEFT CRANIECTOMY WITH TUMOR EXCISION;  Surgeon: Cheryle Debby LABOR, MD;  Location: [facility] OR;  Service: Neurosurgery;  Laterality: Left;   KNEE SURGERY Right    NASAL SEPTOPLASTY W/ TURBINOPLASTY Bilateral 03/19/2020   Procedure: NASAL SEPTOPLASTY WITH BILATERAL  TURBINATE REDUCTION;  Surgeon: Karis Clunes, MD;  Location: [facility] OR;  Service: ENT;  Laterality: Bilateral;   VASECTOMY     Patient Active Problem List   Diagnosis Date Noted   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   Current use of long term anticoagulation 05/06/2022   Seizure (HCC) 08/13/2020   Seizures (HCC) 08/12/2020   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   DVT (deep venous thrombosis) (HCC) 08/12/2020   COVID-19 virus  infection 08/12/2020   Acute lower UTI 07/14/2020   Delirium 07/06/2020   Sleep apnea 07/06/2020   GERD (gastroesophageal reflux disease) 07/06/2020   Tetraplegia (HCC) 06/25/2020   Sundowning 06/25/2020   Slow transit constipation    Essential hypertension    Hypokalemia    Postoperative pain    Meningioma (HCC) 05/02/2020   Brain tumor (HCC) 04/28/2020   S/P nasal septoplasty 03/19/2020   Special screening for malignant neoplasms, colon 01/09/2018    ONSET DATE: 04/03/2023  REFERRING DIAG: G82.50 (ICD-10-CM) - Tetraplegia (HCC) D32.9 (ICD-10-CM) - Meningioma (HCC)  THERAPY DIAG:  Other abnormalities of gait and mobility  Muscle weakness (generalized)  Other lack of coordination  Rationale for Evaluation and Treatment:  Rehabilitation  SUBJECTIVE:                                                                                                                                                                                             SUBJECTIVE STATEMENT:  Pt presents in WC. Reports doing well, no falls or acute changes to report. Still walking in the house when able.   Pt accompanied by: self and family member Heron   PERTINENT HISTORY: anxiety, OA, cancer, subdural hematoma   PAIN:  Are you having pain? No  PRECAUTIONS: Fall  RED FLAGS: None   WEIGHT BEARING RESTRICTIONS: No  FALLS: Has patient fallen in last 6 months? No  LIVING ENVIRONMENT: Lives with: lives with their spouse Lives in: House/apartment Home is power wheelchair accessible Has following equipment at home: Wheelchair (power), Ramped entry, and slide board, [name], handicap accessible bathroom, hospital bed  PLOF: Independent with household mobility with device, Needs assistance with ADLs, and Needs assistance with transfers  PATIENT GOALS: stand pivot transfer take steps in // bars  OBJECTIVE:  Note: Objective measures were completed at Evaluation unless otherwise noted.  DIAGNOSTIC FINDINGS: None relevant to this POC  COGNITION: Overall cognitive status: Impaired   SENSATION: No N/T per patient report  EDEMA:  Yes in BLE, wears compression stockings  POSTURE: rounded shoulders, forward head, and posterior pelvic tilt  LOWER EXTREMITY ROM:     Passive  Right Eval Left Eval  Hip flexion Tight hip flexors Tight hip flexors  Hip extension    Hip abduction    Hip adduction    Hip internal rotation    Hip external rotation    Knee flexion    Knee extension Tight HS Tight HS  Ankle dorsiflexion Tight gastroc Tight gastroc  Ankle plantarflexion    Ankle inversion    Ankle eversion     (Blank rows = not tested)  LOWER EXTREMITY MMT:    MMT Right Eval Left Eval  Hip flexion 2- 3  Hip  extension    Hip abduction    Hip adduction    Hip internal rotation    Hip external rotation    Knee flexion 2- 3  Knee extension 2- 3  Ankle dorsiflexion 0 1  Ankle plantarflexion    Ankle inversion    Ankle eversion    (Blank rows = not tested)  BED MOBILITY:  Sit to supine Mod A Supine to sit Min A Min A for trunk for supine to sit Mod A for BLE for sit to supine  TRANSFERS: Assistive device utilized: RW  Sit to stand: SBA and CGA Stand to sit: CGA Chair to chair: CGA  VITALS  There were no vitals filed for this visit.                                                                                                                              TREATMENT:   Ther Act  Pt performed sit to stand transfer from power chair to mat table on L side w/RW and SBA  Pt performed sit >supine on mat table w/min A for RLE management only The following P/ROM stretches were performed for improved flexibility and reduced tightness in RLE:  Straight leg hamstring stretch w/DF stretch, x90s per side. Reduced hamstring mobility noted in LLE > RLE Hip 90-90 stretch, x90s per side. Pt w/reduced hip IR bilaterally, R>L  Hip adductor stretch, x90s per side The following exercises were performed for improved posterior chain strength, RLE NMR and improved mobility of RLE:  Supine glute bridges, x10 reps. Pt unable to clear hips off mat table but was close  Supine heel slides on RLE using slide sheet, x10 reps w/AAROM due to difficulty w/knee extension. Pt reports he feels stuck when trying to extend R knee and noted significant tightness in medial hamstrings/hip adductors. Pt had palpable quad contraction w/movement.  Supine SAQ w/legs elevated on bolster, x10 reps per side. Pt able to perform well on LLE but required AAROM to perform on R side as pt marching his leg rather than extending knee. Provided max tactile cues to R quad to facilitate movement and pt able to perform a few reps without  assistance. Recommended pt work on this at home, either seated or supine.  Pt performed supine > sit edge of mat w/min-mod A for BLE management and trunk support. Cued pt to use movement of bridge to scoot his hips towards the edge of mat to better position himself and pt able to bring his LLE off mat and almost bring his RLE as well.  Pt performed final sit to stand pivot from mat table to power chair on R side w/RW and SBA.  Pt able to perform LAQ of RLE easily in chair, so recommended pt work on this at home as well as in supine position.  PATIENT EDUCATION: Education details: Work on Haematologist at home  Person educated: Patient, Heron  Education method: Explanation, Demonstration, Tactile cues, and Verbal cues Education comprehension: verbalized understanding, returned demonstration, verbal cues required, tactile cues required, and needs further education  HOME EXERCISE PROGRAM: To be initiated as appropriate  GOALS: Goals reviewed with patient? Yes   NEW SHORT TERM GOALS:   Target date: 08/23/2023  Pt will improve 5 x STS to less than or equal to 45s to demonstrate improved functional strength and transfer efficiency.  Baseline: 54.78s (7/8); 38.5s (8/5) Goal status: MET  2.   Pt will ambulate 2' w/RW and CGA x1 for improved independence and endurance  Baseline: 58 w/RW, CGA x1 and WC follow (7/8); 46' w/SBA and WC follow on 8/5 Goal status: IN PROGRESS   NEW LONG TERM GOALS:  Target date: 09/20/2023  Pt will ambulate 50' and full 180 degree turn wRW and CGA x1 for improved endurance and independence in home environment  Baseline:  Goal status: INITIAL  2.  Pt will take shower using shower bench and RW for improved endurance, independence w/ADLs and transfers.  Baseline: currently taking bird baths  Goal status: INITIAL   ASSESSMENT:  CLINICAL IMPRESSION: Emphasis of skilled PT session on improved mobility of BLEs, posterior chain strength and quad  control. Pt continues to report tightness in RLE that did reduce w/stretching and supine exercises today. Pt exhibits most tightness in R medial hamstrings and hip adductors, limiting his ability to extend his knee w/SAQ. Pt is not currently performing a stretching program so recommended he and [name] start stretching several times per week. Continue POC.    OBJECTIVE IMPAIRMENTS: decreased activity tolerance, decreased balance, decreased cognition, decreased endurance, decreased knowledge of condition, decreased knowledge of use of DME, decreased mobility, difficulty walking, decreased ROM, decreased strength, increased edema, and impaired UE functional use.   ACTIVITY LIMITATIONS: carrying, lifting, bending, standing, transfers, and bed mobility  PARTICIPATION LIMITATIONS: meal prep, cleaning, laundry, and community activity  PERSONAL FACTORS: Fitness, Time since onset of injury/illness/exacerbation, and 1-2 comorbidities:   anxiety, OA, cancer, subdural hematoma are also affecting patient's functional outcome.   REHAB POTENTIAL: Good  CLINICAL DECISION MAKING: Stable/uncomplicated  EVALUATION COMPLEXITY: High  PLAN:  PT FREQUENCY: 1-2x/week  PT DURATION: 6 weeks + 8 weeks + 8 weeks (recert)  PLANNED INTERVENTIONS: 02835- PT Re-evaluation, 97110-Therapeutic exercises, 97530- Therapeutic activity, 97112- Neuromuscular re-education, 97535- Self Care, 02859- Manual therapy, [phone]- Gait training, [phone]- Orthotic Fit/training, [phone]- Electrical stimulation (manual), Patient/Family education, Balance training, Taping, Dry Needling, Joint mobilization, Cognitive remediation, DME instructions, Wheelchair mobility training, Cryotherapy, and Moist heat  PLAN FOR NEXT SESSION: 30th visit PN. Establish stretching program w/[name]. Work on Oncologist, retro gait, continue gait training w/RW, Sit to stands-from lower surfaces. EMOM training, independence w/transfers, step taps, lateral stepping at  ballet bar again? 180 degree turns with RW   Alanii Ramer E Jesica Goheen, PT, DPT  09/06/2023, 2:07 PM

## 2023-09-07 ENCOUNTER — Other Ambulatory Visit (HOSPITAL_BASED_OUTPATIENT_CLINIC_OR_DEPARTMENT_OTHER): Payer: Self-pay

## 2023-09-12 ENCOUNTER — Other Ambulatory Visit (HOSPITAL_BASED_OUTPATIENT_CLINIC_OR_DEPARTMENT_OTHER): Payer: Self-pay

## 2023-09-13 ENCOUNTER — Ambulatory Visit: Payer: Self-pay | Admitting: Physical Therapy

## 2023-09-13 DIAGNOSIS — R29898 Other symptoms and signs involving the musculoskeletal system: Secondary | ICD-10-CM

## 2023-09-13 DIAGNOSIS — M6281 Muscle weakness (generalized): Secondary | ICD-10-CM

## 2023-09-13 DIAGNOSIS — R2689 Other abnormalities of gait and mobility: Secondary | ICD-10-CM | POA: Diagnosis not present

## 2023-09-13 DIAGNOSIS — R2681 Unsteadiness on feet: Secondary | ICD-10-CM

## 2023-09-13 DIAGNOSIS — R293 Abnormal posture: Secondary | ICD-10-CM

## 2023-09-13 DIAGNOSIS — R29818 Other symptoms and signs involving the nervous system: Secondary | ICD-10-CM

## 2023-09-13 NOTE — Therapy (Signed)
 OUTPATIENT PHYSICAL THERAPY NEURO TREATMENT - 30th VISIT PROGRESS NOTE***  Patient Name: Anthony Downs MRN: [id number] DOB:11-02-1950, 73 y.o., male Today's Date: 09/13/2023   PCP: Anthony Satterfield, MD REFERRING PROVIDER: Babs Arthea DASEN, MD  Physical Therapy Progress Note   Dates of Reporting Period:*** - 09/13/2023  See Note below for Objective Data and Assessment of Progress/Goals.  Thank you for the referral of this patient. Anthony Downs, PT, DPT, CSRS     END OF SESSION:  PT End of Session - 09/13/23 1533     Visit Number 30    Number of Visits 32   with eval   Date for PT Re-Evaluation 09/20/23   recert   Authorization Type Medicare    PT Start Time 1530    PT Stop Time 1616    PT Time Calculation (min) 46 min    Equipment Utilized During Treatment Gait belt    Activity Tolerance Patient tolerated treatment well    Behavior During Therapy WFL for tasks assessed/performed             Past Medical History:  Diagnosis Date   Acid reflux    Anxiety    Arthritis    Asthma    as a child   Basal cell carcinoma 01/26/2021   RIGHT POST SURICULAR INFERIOR   Basal cell carcinoma 01/26/2021   RIGHT POST AURICULAR SUPERIOR   Benign brain tumor (HCC)    Chest pain    a. 09/2019 MV: No ischemia.   Complication of anesthesia    slow to wake up and pain medicines make him sick   GERD (gastroesophageal reflux disease)    History of DVT (deep vein thrombosis)    History of echocardiogram    a. 09/2019 Echo: EF 60-65%, nl RV fxn; b. 09/2022 Echo: EF of 60-65% without regional wall motion abnormalities, normal RV size/function, and borderline dilatation of the aortic root at 39 mm.   Hypercholesteremia    Hypertension    PONV (postoperative nausea and vomiting)    PVC's (premature ventricular contractions)    a. 08/2022 Zio: predominantly sinus rhythm with an average heart rate of 71 (50-150), 5 brief SVT runs (longest 10.2 seconds with max rate of 150).  3.2%  PVC burden was also noted.  No triggered events.   Skin cancer    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, squamous behind ear   Sleep apnea    no Cpap   Subdural hematoma, post-traumatic (HCC) 2008   fell off truck hit head on concrete   Past Surgical History:  Procedure Laterality Date   APPLICATION OF CRANIAL NAVIGATION Left 04/29/2020   Procedure: APPLICATION OF CRANIAL NAVIGATION;  Surgeon: Anthony Debby LABOR, MD;  Location: [facility] OR;  Service: Neurosurgery;  Laterality: Left;   arthroscopic knee     COLONOSCOPY N/A 11/29/2018   Procedure: COLONOSCOPY;  Surgeon: Anthony Claudis PENNER, MD;  Location: [facility];  Service: Endoscopy;  Laterality: N/A;  730-rescheduled 9/2 same time per Ann   CRANIOTOMY Left 04/29/2020   Procedure: LEFT CRANIECTOMY WITH TUMOR EXCISION;  Surgeon: Anthony Debby LABOR, MD;  Location: [facility] OR;  Service: Neurosurgery;  Laterality: Left;   KNEE SURGERY Right    NASAL SEPTOPLASTY W/ TURBINOPLASTY Bilateral 03/19/2020   Procedure: NASAL SEPTOPLASTY WITH BILATERAL  TURBINATE REDUCTION;  Surgeon: Anthony Clunes, MD;  Location: [facility] OR;  Service: ENT;  Laterality: Bilateral;   VASECTOMY     Patient Active Problem List   Diagnosis Date Noted  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   Current use of long term anticoagulation 05/06/2022   Seizure (HCC) 08/13/2020   Seizures (HCC) 08/12/2020   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   DVT (deep venous thrombosis) (HCC) 08/12/2020   COVID-19 virus infection 08/12/2020   Acute lower UTI 07/14/2020   Delirium 07/06/2020   Sleep apnea 07/06/2020   GERD (gastroesophageal reflux disease) 07/06/2020   Tetraplegia (HCC) 06/25/2020   Sundowning 06/25/2020   Slow transit constipation    Essential hypertension    Hypokalemia    Postoperative pain    Meningioma (HCC) 05/02/2020   Brain tumor (HCC) 04/28/2020   S/P nasal septoplasty 03/19/2020   Special screening for malignant neoplasms, colon 01/09/2018    ONSET DATE:  04/03/2023  REFERRING DIAG: G82.50 (ICD-10-CM) - Tetraplegia (HCC) D32.9 (ICD-10-CM) - Meningioma (HCC)  THERAPY DIAG:  Other abnormalities of gait and mobility  Muscle weakness (generalized)  Abnormal posture  Unsteadiness on feet  Other symptoms and signs involving the musculoskeletal system  Other symptoms and signs involving the nervous system  Rationale for Evaluation and Treatment: Rehabilitation  SUBJECTIVE:                                                                                                                                                                                             SUBJECTIVE STATEMENT:  Pt was given a new medication (primadone***) to control his tremors but it took all his energy, made him nauseous, and upset his stomach. He wasn't even able to walk while on the medication. He took it for 2 days then stopped, has been off of it for 3 days. He is still feeling nauseous today.  Pt didn't feel sore after stretching last visit. Denies any acute changes, no falls.  Pt accompanied by: self and family member Anthony Downs  PERTINENT HISTORY: anxiety, OA, cancer, subdural hematoma   PAIN:  Are you having pain? No  PRECAUTIONS: Fall  RED FLAGS: None   WEIGHT BEARING RESTRICTIONS: No  FALLS: Has patient fallen in last 6 months? No  LIVING ENVIRONMENT: Lives with: lives with their spouse Lives in: House/apartment Home is power wheelchair accessible Has following equipment at home: Wheelchair (power), Ramped entry, and slide board, Anthony Downs, handicap accessible bathroom, hospital bed  PLOF: Independent with household mobility with device, Needs assistance with ADLs, and Needs assistance with transfers  PATIENT GOALS: stand pivot transfer take steps in // bars  OBJECTIVE:  Note: Objective measures were completed at Evaluation unless otherwise noted.  DIAGNOSTIC FINDINGS: None relevant to this POC  COGNITION: Overall cognitive status:  Impaired   SENSATION: No N/T per  patient report  EDEMA:  Yes in BLE, wears compression stockings  POSTURE: rounded shoulders, forward head, and posterior pelvic tilt  LOWER EXTREMITY ROM:     Passive  Right Eval Left Eval  Hip flexion Tight hip flexors Tight hip flexors  Hip extension    Hip abduction    Hip adduction    Hip internal rotation    Hip external rotation    Knee flexion    Knee extension Tight HS Tight HS  Ankle dorsiflexion Tight gastroc Tight gastroc  Ankle plantarflexion    Ankle inversion    Ankle eversion     (Blank rows = not tested)  LOWER EXTREMITY MMT:    MMT Right Eval Left Eval  Hip flexion 2- 3  Hip extension    Hip abduction    Hip adduction    Hip internal rotation    Hip external rotation    Knee flexion 2- 3  Knee extension 2- 3  Ankle dorsiflexion 0 1  Ankle plantarflexion    Ankle inversion    Ankle eversion    (Blank rows = not tested)  BED MOBILITY:  Sit to supine Mod A Supine to sit Min A Min A for trunk for supine to sit Mod A for BLE for sit to supine  TRANSFERS: Assistive device utilized: RW  Sit to stand: SBA and CGA Stand to sit: CGA Chair to chair: CGA  VITALS  There were no vitals filed for this visit.                                                                                                                              TREATMENT:   Ther Act  Discussed patient would not be safe to transfer on/off of his lawn mower and that he needs to keep working towards performing a shower transfer at home Pt received seated in his PWC Sit to stand with SBA to RW, encouragement to perform as independently as possible and without physical assist Standing alt L/R 4 step taps 2 x 5 reps B Initially with assist to lift RLE on/off step but progresses to performing it independently before onset of fatigue Sit to stand with min A to RW for last 2 stands Ambulation x 30 ft, x 31 ft ***     PATIENT  EDUCATION: Education details: Work on stretching and Multimedia programmer at home *** Person educated: Patient, Music therapist method: Explanation, Demonstration, Tactile cues, and Verbal cues Education comprehension: verbalized understanding, returned demonstration, verbal cues required, tactile cues required, and needs further education  HOME EXERCISE PROGRAM: To be initiated as appropriate  GOALS: Goals reviewed with patient? Yes   NEW SHORT TERM GOALS:   Target date: 08/23/2023  Pt will improve 5 x STS to less than or equal to 45s to demonstrate improved functional strength and transfer efficiency.  Baseline: 54.78s (7/8); 38.5s (8/5) Goal status: MET  2.   Pt will ambulate 15' w/RW and  CGA x1 for improved independence and endurance  Baseline: 58 w/RW, CGA x1 and WC follow (7/8); 46' w/SBA and WC follow on 8/5 Goal status: IN PROGRESS   NEW LONG TERM GOALS:  Target date: 09/20/2023  Pt will ambulate 50' and full 180 degree turn wRW and CGA x1 for improved endurance and independence in home environment  Baseline:  Goal status: INITIAL  2.  Pt will take shower using shower bench and RW for improved endurance, independence w/ADLs and transfers.  Baseline: currently taking bird baths  Goal status: INITIAL   ASSESSMENT:  CLINICAL IMPRESSION: Emphasis of skilled PT session on*** improved mobility of BLEs, posterior chain strength and quad control. Pt continues to report tightness in RLE that did reduce w/stretching and supine exercises today. Pt exhibits most tightness in R medial hamstrings and hip adductors, limiting his ability to extend his knee w/SAQ. Pt is not currently performing a stretching program so recommended he and Anthony Downs start stretching several times per week. Continue POC.    OBJECTIVE IMPAIRMENTS: decreased activity tolerance, decreased balance, decreased cognition, decreased endurance, decreased knowledge of condition, decreased knowledge of use of DME, decreased mobility,  difficulty walking, decreased ROM, decreased strength, increased edema, and impaired UE functional use.   ACTIVITY LIMITATIONS: carrying, lifting, bending, standing, transfers, and bed mobility  PARTICIPATION LIMITATIONS: meal prep, cleaning, laundry, and community activity  PERSONAL FACTORS: Fitness, Time since onset of injury/illness/exacerbation, and 1-2 comorbidities:   anxiety, OA, cancer, subdural hematoma are also affecting patient's functional outcome.   REHAB POTENTIAL: Good  CLINICAL DECISION MAKING: Stable/uncomplicated  EVALUATION COMPLEXITY: High  PLAN:  PT FREQUENCY: 1-2x/week  PT DURATION: 6 weeks + 8 weeks + 8 weeks (recert)  PLANNED INTERVENTIONS: 02835- PT Re-evaluation, 97110-Therapeutic exercises, 97530- Therapeutic activity, 97112- Neuromuscular re-education, 97535- Self Care, 02859- Manual therapy, [phone]- Gait training, [phone]- Orthotic Fit/training, [phone]- Electrical stimulation (manual), Patient/Family education, Balance training, Taping, Dry Needling, Joint mobilization, Cognitive remediation, DME instructions, Wheelchair mobility training, Cryotherapy, and Moist heat  PLAN FOR NEXT SESSION: 30th visit PN. Establish stretching program w/Anthony Downs. Work on Oncologist, retro gait, continue gait training w/RW, Sit to stands-from lower surfaces. EMOM training, independence w/transfers, step taps, lateral stepping at ballet bar again? 180 degree turns with RW***   Anthony Downs, PT Anthony Downs, PT, DPT, CSRS   09/13/2023, 4:17 PM

## 2023-09-20 ENCOUNTER — Ambulatory Visit: Payer: Self-pay | Attending: Physical Medicine & Rehabilitation | Admitting: Physical Therapy

## 2023-09-20 DIAGNOSIS — R293 Abnormal posture: Secondary | ICD-10-CM | POA: Diagnosis present

## 2023-09-20 DIAGNOSIS — M6281 Muscle weakness (generalized): Secondary | ICD-10-CM | POA: Insufficient documentation

## 2023-09-20 DIAGNOSIS — R29898 Other symptoms and signs involving the musculoskeletal system: Secondary | ICD-10-CM | POA: Diagnosis present

## 2023-09-20 DIAGNOSIS — R2689 Other abnormalities of gait and mobility: Secondary | ICD-10-CM | POA: Insufficient documentation

## 2023-09-20 DIAGNOSIS — R2681 Unsteadiness on feet: Secondary | ICD-10-CM | POA: Insufficient documentation

## 2023-09-20 DIAGNOSIS — R29818 Other symptoms and signs involving the nervous system: Secondary | ICD-10-CM | POA: Insufficient documentation

## 2023-09-20 NOTE — Therapy (Signed)
 OUTPATIENT PHYSICAL THERAPY NEURO TREATMENT - RECERT  Patient Name: Anthony Downs MRN: [id number] DOB:06-19-1950, 73 y.o., male Today's Date: 09/20/2023   PCP: Anthony Satterfield, MD REFERRING PROVIDER: Babs Arthea DASEN, MD      END OF SESSION:  PT End of Session - 09/20/23 1528     Visit Number 31    Number of Visits 39   recert   Date for PT Re-Evaluation 11/29/23   recert   Authorization Type Medicare    PT Start Time 1525    PT Stop Time 1621    PT Time Calculation (min) 56 min    Equipment Utilized During Treatment Gait belt    Activity Tolerance Patient tolerated treatment well    Behavior During Therapy WFL for tasks assessed/performed              Past Medical History:  Diagnosis Date   Acid reflux    Anxiety    Arthritis    Asthma    as a child   Basal cell carcinoma 01/26/2021   RIGHT POST SURICULAR INFERIOR   Basal cell carcinoma 01/26/2021   RIGHT POST AURICULAR SUPERIOR   Benign brain tumor (HCC)    Chest pain    a. 09/2019 MV: No ischemia.   Complication of anesthesia    slow to wake up and pain medicines make him sick   GERD (gastroesophageal reflux disease)    History of DVT (deep vein thrombosis)    History of echocardiogram    a. 09/2019 Echo: EF 60-65%, nl RV fxn; b. 09/2022 Echo: EF of 60-65% without regional wall motion abnormalities, normal RV size/function, and borderline dilatation of the aortic root at 39 mm.   Hypercholesteremia    Hypertension    PONV (postoperative nausea and vomiting)    PVC's (premature ventricular contractions)    a. 08/2022 Zio: predominantly sinus rhythm with an average heart rate of 71 (50-150), 5 brief SVT runs (longest 10.2 seconds with max rate of 150).  3.2% PVC burden was also noted.  No triggered events.   Skin cancer    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, squamous behind ear   Sleep apnea    no Cpap   Subdural hematoma, post-traumatic (HCC) 2008   fell off truck hit head on concrete   Past  Surgical History:  Procedure Laterality Date   APPLICATION OF CRANIAL NAVIGATION Left 04/29/2020   Procedure: APPLICATION OF CRANIAL NAVIGATION;  Surgeon: Anthony Debby LABOR, MD;  Location: [facility] OR;  Service: Neurosurgery;  Laterality: Left;   arthroscopic knee     COLONOSCOPY N/A 11/29/2018   Procedure: COLONOSCOPY;  Surgeon: Anthony Claudis PENNER, MD;  Location: [facility];  Service: Endoscopy;  Laterality: N/A;  730-rescheduled 9/2 same time per Ann   CRANIOTOMY Left 04/29/2020   Procedure: LEFT CRANIECTOMY WITH TUMOR EXCISION;  Surgeon: Anthony Debby LABOR, MD;  Location: [facility] OR;  Service: Neurosurgery;  Laterality: Left;   KNEE SURGERY Right    NASAL SEPTOPLASTY W/ TURBINOPLASTY Bilateral 03/19/2020   Procedure: NASAL SEPTOPLASTY WITH BILATERAL  TURBINATE REDUCTION;  Surgeon: Anthony Clunes, MD;  Location: [facility] OR;  Service: ENT;  Laterality: Bilateral;   VASECTOMY     Patient Active Problem List   Diagnosis Date Noted   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   Current use of long term anticoagulation 05/06/2022   Seizure (HCC) 08/13/2020   Seizures (HCC) 08/12/2020   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   DVT (deep venous thrombosis) (HCC)  08/12/2020   COVID-19 virus infection 08/12/2020   Acute lower UTI 07/14/2020   Delirium 07/06/2020   Sleep apnea 07/06/2020   GERD (gastroesophageal reflux disease) 07/06/2020   Tetraplegia (HCC) 06/25/2020   Sundowning 06/25/2020   Slow transit constipation    Essential hypertension    Hypokalemia    Postoperative pain    Meningioma (HCC) 05/02/2020   Brain tumor (HCC) 04/28/2020   S/P nasal septoplasty 03/19/2020   Special screening for malignant neoplasms, colon 01/09/2018    ONSET DATE: 04/03/2023  REFERRING DIAG: G82.50 (ICD-10-CM) - Tetraplegia (HCC) D32.9 (ICD-10-CM) - Meningioma (HCC)  THERAPY DIAG:  Other abnormalities of gait and mobility  Muscle weakness (generalized)  Abnormal posture  Unsteadiness on feet  Other symptoms  and signs involving the musculoskeletal system  Other symptoms and signs involving the nervous system  Rationale for Evaluation and Treatment: Rehabilitation  SUBJECTIVE:                                                                                                                                                                                             SUBJECTIVE STATEMENT:  Pt reports things have been better over the past week, he has more energy. He has been able to walk all the way from the bed to the chair. Pt reports he did not try to get on/off the lawnmower because he understands he needs to do the shower transfer first. Anthony Downs is going out of town this weekend and they plan to try it when she gets back. They have done a few dry runs and they have been successful.  Pt accompanied by: self and family member Anthony Downs  PERTINENT HISTORY: anxiety, OA, cancer, subdural hematoma   PAIN:  Are you having pain? No  PRECAUTIONS: Fall  RED FLAGS: None   WEIGHT BEARING RESTRICTIONS: No  FALLS: Has patient fallen in last 6 months? No  LIVING ENVIRONMENT: Lives with: lives with their spouse Lives in: House/apartment Home is power wheelchair accessible Has following equipment at home: Wheelchair (power), Ramped entry, and slide board, Anthony Downs, handicap accessible bathroom, hospital bed  PLOF: Independent with household mobility with device, Needs assistance with ADLs, and Needs assistance with transfers  PATIENT GOALS: stand pivot transfer take steps in // bars  OBJECTIVE:  Note: Objective measures were completed at Evaluation unless otherwise noted.  DIAGNOSTIC FINDINGS: None relevant to this POC  COGNITION: Overall cognitive status: Impaired   SENSATION: No N/T per patient report  EDEMA:  Yes in BLE, wears compression stockings  POSTURE: rounded shoulders, forward head, and posterior pelvic tilt  LOWER EXTREMITY ROM:     Passive  Right Eval Left Eval  Hip  flexion Tight hip flexors Tight hip flexors  Hip extension    Hip abduction    Hip adduction    Hip internal rotation    Hip external rotation    Knee flexion    Knee extension Tight HS Tight HS  Ankle dorsiflexion Tight gastroc Tight gastroc  Ankle plantarflexion    Ankle inversion    Ankle eversion     (Blank rows = not tested)  LOWER EXTREMITY MMT:    MMT Right Eval Left Eval  Hip flexion 2- 3  Hip extension    Hip abduction    Hip adduction    Hip internal rotation    Hip external rotation    Knee flexion 2- 3  Knee extension 2- 3  Ankle dorsiflexion 0 1  Ankle plantarflexion    Ankle inversion    Ankle eversion    (Blank rows = not tested)  BED MOBILITY:  Sit to supine Mod A Supine to sit Min A Min A for trunk for supine to sit Mod A for BLE for sit to supine  TRANSFERS: Assistive device utilized: RW  Sit to stand: SBA and CGA Stand to sit: CGA Chair to chair: CGA  VITALS  There were no vitals filed for this visit.                                                                                                                              TREATMENT:   Ther Act  Gait 4 x 50 ft with bari RW and CGA with two 180 degree turns. Pt exhibits good RW safety and management with turns and with backing up to sit in his PWC except for during last bout of ambulation he has onset of fatigue and is unable to fully turn to sit back in his chair, requires assist to bring his PWC up behind him as he turns about halfway at end of last round of ambulation. Pt needs max cueing to safely sit back in his PWC and to swing his hips around to sit down in his chair safely.   PATIENT EDUCATION: Education details: continue working on stretching and walking at home, [employer] TRANSFER Person educated: Patient, Anthony Downs Education method: Explanation, Demonstration, Tactile cues, and Verbal cues Education comprehension: verbalized understanding, returned demonstration, verbal cues  required, tactile cues required, and needs further education  HOME EXERCISE PROGRAM: To be initiated as appropriate  GOALS: Goals reviewed with patient? Yes   NEW SHORT TERM GOALS:   Target date: 08/23/2023  Pt will improve 5 x STS to less than or equal to 45s to demonstrate improved functional strength and transfer efficiency.  Baseline: 54.78s (7/8); 38.5s (8/5) Goal status: MET  2.   Pt will ambulate 81' w/RW and CGA x1 for improved independence and endurance  Baseline: 58 w/RW, CGA x1 and WC follow (7/8); 46' w/SBA and WC follow on 8/5 Goal status: IN PROGRESS  NEW LONG TERM GOALS:  Target date: 09/20/2023  Pt will ambulate 50' and full 180 degree turn wRW and CGA x1 for improved endurance and independence in home environment  Baseline: 50 ft x 4 with 180 degree turn with RW and CGA (9/2) Goal status: MET  2.  Pt will take shower using shower bench and RW for improved endurance, independence w/ADLs and transfers.  Baseline: currently taking bird baths, has performed a dry run but still needs to take an actual shower (9/2)  Goal status: IN PROGRESS  NEW NEW SHORT TERM GOALS:   Target date: 10/18/2023   Pt will ambulate x 65 ft with RW and CGA for improved endurance. Baseline: 50 ft with 180 degree turn with RW and CGA (9/2) Goal status: INITIAL  2.  Pt will take shower using shower bench and RW for improved endurance, independence w/ADLs and transfers.  Baseline: currently taking bird baths, has performed a dry run but still needs to take an actual shower (9/2)  Goal status: IN PROGRESS   NEW NEW LONG TERM GOALS:  Target date: 11/15/2023   Pt will ambulate x 80 ft with RW and CGA for improved endurance. Baseline: 50 ft with 180 degree turn with RW and CGA (9/2) Goal status: INITIAL  2.  Pt will improve 5 x STS to less than or equal to 30s to demonstrate improved functional strength and transfer efficiency.  Baseline: 54.78s (7/8); 38.5s (8/5) Goal status:  INITIAL     ASSESSMENT:  CLINICAL IMPRESSION: Emphasis of skilled PT session on assessing LTG and writing new STG/LTG for recertification of PT services this date. Pt has met 1/2 LTG assessed this date as he was able to ambulate x 50 ft with his RW and perform a 180 degree turn with just CGA x 3 full repetitions (with seated rest break in between repetitions). He does attempt a 4th repetition but due to onset of fatigue does need some assist sitting at the end. He has still not performed a real shower at home but has done a dry run of shower transfers and they went well. Pt agreeable to try an actual shower and transfer at home once Anthony Downs gets back from the beach. He continues to benefit from skilled PT services to continue to work towards increased safety and independence with functional mobility as he is making good progress overall. Continue POC.    OBJECTIVE IMPAIRMENTS: decreased activity tolerance, decreased balance, decreased cognition, decreased endurance, decreased knowledge of condition, decreased knowledge of use of DME, decreased mobility, difficulty walking, decreased ROM, decreased strength, increased edema, and impaired UE functional use.   ACTIVITY LIMITATIONS: carrying, lifting, bending, standing, transfers, and bed mobility  PARTICIPATION LIMITATIONS: meal prep, cleaning, laundry, and community activity  PERSONAL FACTORS: Fitness, Time since onset of injury/illness/exacerbation, and 1-2 comorbidities:   anxiety, OA, cancer, subdural hematoma are also affecting patient's functional outcome.   REHAB POTENTIAL: Good  CLINICAL DECISION MAKING: Stable/uncomplicated  EVALUATION COMPLEXITY: High  PLAN:  PT FREQUENCY: 1-2x/week  PT DURATION: 6 weeks + 8 weeks + 8 weeks (recert) + 8 weeks (scheduled for 4, can add 4 pending progress)  PLANNED INTERVENTIONS: 02835- PT Re-evaluation, 97110-Therapeutic exercises, 97530- Therapeutic activity, 97112- Neuromuscular re-education,  97535- Self Care, 02859- Manual therapy, [phone]- Gait training, [phone]- Orthotic Fit/training, [phone]- Electrical stimulation (manual), Patient/Family education, Balance training, Taping, Dry Needling, Joint mobilization, Cognitive remediation, DME instructions, Wheelchair mobility training, Cryotherapy, and Moist heat  PLAN FOR NEXT SESSION: Establish stretching program w/Anthony Downs. Work on  stand pivots, retro gait, continue gait training w/RW, Sit to stands-from lower surfaces. EMOM training, independence w/transfers, step taps, lateral stepping at ballet bar again? 180 degree turns with RW, increasing endurance/gait distance, did he do shower with Anthony Downs???, scheduled for 4 visits with recert - can add 4 more pending progress (discussed this with patient 9/2)   Waddell Southgate, PT Waddell Southgate, PT, DPT, CSRS   09/20/2023, 4:22 PM

## 2023-09-22 ENCOUNTER — Other Ambulatory Visit (HOSPITAL_BASED_OUTPATIENT_CLINIC_OR_DEPARTMENT_OTHER): Payer: Self-pay

## 2023-09-27 ENCOUNTER — Ambulatory Visit: Payer: Self-pay | Admitting: Physical Therapy

## 2023-09-27 DIAGNOSIS — R293 Abnormal posture: Secondary | ICD-10-CM

## 2023-09-27 DIAGNOSIS — M6281 Muscle weakness (generalized): Secondary | ICD-10-CM

## 2023-09-27 DIAGNOSIS — R2689 Other abnormalities of gait and mobility: Secondary | ICD-10-CM | POA: Diagnosis not present

## 2023-09-27 DIAGNOSIS — R2681 Unsteadiness on feet: Secondary | ICD-10-CM

## 2023-09-27 NOTE — Therapy (Signed)
 OUTPATIENT PHYSICAL THERAPY NEURO TREATMENT  Patient Name: Anthony Downs MRN: [id number] DOB:[birth year], 73 y.o., male Today's Date: 09/27/2023   PCP: Bertell Satterfield, MD REFERRING PROVIDER: Babs Arthea DASEN, MD      END OF SESSION:  PT End of Session - 09/27/23 1532     Visit Number 32    Number of Visits 39   recert   Date for PT Re-Evaluation 11/29/23   recert   Authorization Type Medicare    PT Start Time 1530    PT Stop Time 1615    PT Time Calculation (min) 45 min    Equipment Utilized During Treatment Gait belt    Activity Tolerance Patient tolerated treatment well    Behavior During Therapy WFL for tasks assessed/performed              Past Medical History:  Diagnosis Date   Acid reflux    Anxiety    Arthritis    Asthma    as a child   Basal cell carcinoma 01/26/2021   RIGHT POST SURICULAR INFERIOR   Basal cell carcinoma 01/26/2021   RIGHT POST AURICULAR SUPERIOR   Benign brain tumor (HCC)    Chest pain    a. 09/2019 MV: No ischemia.   Complication of anesthesia    slow to wake up and pain medicines make him sick   GERD (gastroesophageal reflux disease)    History of DVT (deep vein thrombosis)    History of echocardiogram    a. 09/2019 Echo: EF 60-65%, nl RV fxn; b. 09/2022 Echo: EF of 60-65% without regional wall motion abnormalities, normal RV size/function, and borderline dilatation of the aortic root at 39 mm.   Hypercholesteremia    Hypertension    PONV (postoperative nausea and vomiting)    PVC's (premature ventricular contractions)    a. 08/2022 Zio: predominantly sinus rhythm with an average heart rate of 71 (50-150), 5 brief SVT runs (longest 10.2 seconds with max rate of 150).  3.2% PVC burden was also noted.  No triggered events.   Skin cancer    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, squamous behind ear   Sleep apnea    no Cpap   Subdural hematoma, post-traumatic (HCC) 2008   fell off truck hit head on concrete   Past Surgical  History:  Procedure Laterality Date   APPLICATION OF CRANIAL NAVIGATION Left 04/29/2020   Procedure: APPLICATION OF CRANIAL NAVIGATION;  Surgeon: Cheryle Debby LABOR, MD;  Location: [facility] OR;  Service: Neurosurgery;  Laterality: Left;   arthroscopic knee     COLONOSCOPY N/A 11/29/2018   Procedure: COLONOSCOPY;  Surgeon: Golda Claudis PENNER, MD;  Location: [facility];  Service: Endoscopy;  Laterality: N/A;  730-rescheduled 9/2 same time per Ann   CRANIOTOMY Left 04/29/2020   Procedure: LEFT CRANIECTOMY WITH TUMOR EXCISION;  Surgeon: Cheryle Debby LABOR, MD;  Location: [facility] OR;  Service: Neurosurgery;  Laterality: Left;   KNEE SURGERY Right    NASAL SEPTOPLASTY W/ TURBINOPLASTY Bilateral 03/19/2020   Procedure: NASAL SEPTOPLASTY WITH BILATERAL  TURBINATE REDUCTION;  Surgeon: Karis Clunes, MD;  Location: [facility] OR;  Service: ENT;  Laterality: Bilateral;   VASECTOMY     Patient Active Problem List   Diagnosis Date Noted   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   Current use of long term anticoagulation 05/06/2022   Seizure (HCC) 08/13/2020   Seizures (HCC) 08/12/2020   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   DVT (deep venous thrombosis) (HCC) 08/12/2020  COVID-19 virus infection 08/12/2020   Acute lower UTI 07/14/2020   Delirium 07/06/2020   Sleep apnea 07/06/2020   GERD (gastroesophageal reflux disease) 07/06/2020   Tetraplegia (HCC) 06/25/2020   Sundowning 06/25/2020   Slow transit constipation    Essential hypertension    Hypokalemia    Postoperative pain    Meningioma (HCC) 05/02/2020   Brain tumor (HCC) 04/28/2020   S/P nasal septoplasty 03/19/2020   Special screening for malignant neoplasms, colon 01/09/2018    ONSET DATE: 04/03/2023  REFERRING DIAG: G82.50 (ICD-10-CM) - Tetraplegia (HCC) D32.9 (ICD-10-CM) - Meningioma (HCC)  THERAPY DIAG:  Other abnormalities of gait and mobility  Muscle weakness (generalized)  Abnormal posture  Unsteadiness on feet  Rationale for Evaluation  and Treatment: Rehabilitation  SUBJECTIVE:                                                                                                                                                                                             SUBJECTIVE STATEMENT:  Pt reports he took a shower this AM. Used the Stedy and had to step backwards off the stedy to get into the shower chair. Was able to shower mostly by himself and then used the stedy to get out. No falls.   Pt accompanied by: self and family member [name]  PERTINENT HISTORY: anxiety, OA, cancer, subdural hematoma   PAIN:  Are you having pain? No  PRECAUTIONS: Fall  RED FLAGS: None   WEIGHT BEARING RESTRICTIONS: No  FALLS: Has patient fallen in last 6 months? No  LIVING ENVIRONMENT: Lives with: lives with their spouse Lives in: House/apartment Home is power wheelchair accessible Has following equipment at home: Wheelchair (power), Ramped entry, and slide board, [name], handicap accessible bathroom, hospital bed  PLOF: Independent with household mobility with device, Needs assistance with ADLs, and Needs assistance with transfers  PATIENT GOALS: stand pivot transfer take steps in // bars  OBJECTIVE:  Note: Objective measures were completed at Evaluation unless otherwise noted.  DIAGNOSTIC FINDINGS: None relevant to this POC  COGNITION: Overall cognitive status: Impaired   SENSATION: No N/T per patient report  EDEMA:  Yes in BLE, wears compression stockings  POSTURE: rounded shoulders, forward head, and posterior pelvic tilt  LOWER EXTREMITY ROM:     Passive  Right Eval Left Eval  Hip flexion Tight hip flexors Tight hip flexors  Hip extension    Hip abduction    Hip adduction    Hip internal rotation    Hip external rotation    Knee flexion    Knee extension Tight HS Tight HS  Ankle dorsiflexion Tight gastroc Tight  gastroc  Ankle plantarflexion    Ankle inversion    Ankle eversion     (Blank  rows = not tested)  LOWER EXTREMITY MMT:    MMT Right Eval Left Eval  Hip flexion 2- 3  Hip extension    Hip abduction    Hip adduction    Hip internal rotation    Hip external rotation    Knee flexion 2- 3  Knee extension 2- 3  Ankle dorsiflexion 0 1  Ankle plantarflexion    Ankle inversion    Ankle eversion    (Blank rows = not tested)  BED MOBILITY:  Sit to supine Mod A Supine to sit Min A Min A for trunk for supine to sit Mod A for BLE for sit to supine  TRANSFERS: Assistive device utilized: RW  Sit to stand: SBA and CGA Stand to sit: CGA Chair to chair: CGA  VITALS  There were no vitals filed for this visit.                                                                                                                              TREATMENT:   Ther Act/Self-care/NMR Discussed using the RW rather than the Stedy to at least get into the shower as stepping off the [place] is not advised due to safety concerns. Pt verbalized understanding and stated he would try. Pt performed sit to stand pivot transfer w/RW from power chair to mat on L side w/SBA   From 22' mat height, sit to stands without UE support and  2kg ball throw, x6 reps, for improved endurance, functional BLE strength, immediate standing balance and independence w/transfers. Provided mod multimodal cues for proper technique using NDT principles and pt able to perform w/CGA-light min A. I did not know I could do that. Encouraged pt to continue working on this at home from his lift chair   Gait pattern: step through pattern, decreased stride length, decreased hip/knee flexion- Right, decreased hip/knee flexion- Left, and trunk flexed Distance walked: 57', 58'  Assistive device utilized: Environmental consultant - 2 wheeled Level of assistance: SBA Comments: Pt able to ambulate full lap around gym with only one seated rest break today w/SBA only. Provided WC follow to provide seat for pt when he needed to rest. Pt able to  maintain conversation w/therapist during ambulation     PATIENT EDUCATION: Education details: Work on Engineer, structural w/RW, working on sit to stands w/reduced UE support  Person educated: Patient, Public affairs consultant: Explanation, Demonstration, Tactile cues, and Verbal cues Education comprehension: verbalized understanding, returned demonstration, verbal cues required, tactile cues required, and needs further education  HOME EXERCISE PROGRAM: To be initiated as appropriate  GOALS: Goals reviewed with patient? Yes   NEW SHORT TERM GOALS:   Target date: 08/23/2023  Pt will improve 5 x STS to less than or equal to 45s to demonstrate improved functional strength and transfer efficiency.  Baseline: 54.78s (7/8); 38.5s (  8/5) Goal status: MET  2.   Pt will ambulate 61' w/RW and CGA x1 for improved independence and endurance  Baseline: 58 w/RW, CGA x1 and WC follow (7/8); 46' w/SBA and WC follow on 8/5 Goal status: IN PROGRESS   NEW LONG TERM GOALS:  Target date: 09/20/2023  Pt will ambulate 50' and full 180 degree turn wRW and CGA x1 for improved endurance and independence in home environment  Baseline: 50 ft x 4 with 180 degree turn with RW and CGA (9/2) Goal status: MET  2.  Pt will take shower using shower bench and RW for improved endurance, independence w/ADLs and transfers.  Baseline: currently taking bird baths, has performed a dry run but still needs to take an actual shower (9/2)  Goal status: IN PROGRESS  NEW NEW SHORT TERM GOALS:   Target date: 10/18/2023   Pt will ambulate x 65 ft with RW and CGA for improved endurance. Baseline: 50 ft with 180 degree turn with RW and CGA (9/2) Goal status: INITIAL  2.  Pt will take shower using shower bench and RW for improved endurance, independence w/ADLs and transfers.  Baseline: currently taking bird baths, has performed a dry run but still needs to take an actual shower (9/2)  Goal status: IN PROGRESS   NEW NEW LONG  TERM GOALS:  Target date: 11/15/2023   Pt will ambulate x 80 ft with RW and CGA for improved endurance. Baseline: 50 ft with 180 degree turn with RW and CGA (9/2) Goal status: INITIAL  2.  Pt will improve 5 x STS to less than or equal to 30s to demonstrate improved functional strength and transfer efficiency.  Baseline: 54.78s (7/8); 38.5s (8/5) Goal status: INITIAL     ASSESSMENT:  CLINICAL IMPRESSION: Emphasis of skilled PT session on sit to stands w/reduced UE reliance, immediate standing balance and endurance. Pt reports he did take a shower this morning but used the [place] rather than the RW. Encouraged pt to use the RW next time as stepping on/off the [place] is not safe while it is in use. Pt able to perform sit to stands from 22 height without UE support today while holding 2kg ball. Pt surprised he was strong enough to do so, so continue to provide encouragement regarding pt's capabilities. Pt ambulated 115' with only one rest break today for the first time as well, indicative of improved endurance and capacity.  Continue POC.    OBJECTIVE IMPAIRMENTS: decreased activity tolerance, decreased balance, decreased cognition, decreased endurance, decreased knowledge of condition, decreased knowledge of use of DME, decreased mobility, difficulty walking, decreased ROM, decreased strength, increased edema, and impaired UE functional use.   ACTIVITY LIMITATIONS: carrying, lifting, bending, standing, transfers, and bed mobility  PARTICIPATION LIMITATIONS: meal prep, cleaning, laundry, and community activity  PERSONAL FACTORS: Fitness, Time since onset of injury/illness/exacerbation, and 1-2 comorbidities:   anxiety, OA, cancer, subdural hematoma are also affecting patient's functional outcome.   REHAB POTENTIAL: Good  CLINICAL DECISION MAKING: Stable/uncomplicated  EVALUATION COMPLEXITY: High  PLAN:  PT FREQUENCY: 1-2x/week  PT DURATION: 6 weeks + 8 weeks + 8 weeks (recert) + 8  weeks (scheduled for 4, can add 4 pending progress)  PLANNED INTERVENTIONS: 02835- PT Re-evaluation, 97110-Therapeutic exercises, 97530- Therapeutic activity, 97112- Neuromuscular re-education, 97535- Self Care, 02859- Manual therapy, [id number]- Gait training, [phone]- Orthotic Fit/training, [phone]- Electrical stimulation (manual), Patient/Family education, Balance training, Taping, Dry Needling, Joint mobilization, Cognitive remediation, DME instructions, Wheelchair mobility training, Cryotherapy, and Moist heat  PLAN  FOR NEXT SESSION: Establish stretching program w/[name]. Work on Oncologist, retro gait, continue gait training w/RW, Sit to stands-from lower surfaces. EMOM training, independence w/transfers, step taps, lateral stepping at ballet bar again? 180 degree turns with RW, increasing endurance/gait distance, did he do shower with [name] w/RW???, scheduled for 4 visits with recert - can add 4 more pending progress (discussed this with patient 9/2)   Lennie Dunnigan E Seva Chancy, PT, DPT   09/27/2023, 4:18 PM

## 2023-09-30 ENCOUNTER — Other Ambulatory Visit (HOSPITAL_BASED_OUTPATIENT_CLINIC_OR_DEPARTMENT_OTHER): Payer: Self-pay

## 2023-10-05 ENCOUNTER — Telehealth: Payer: Self-pay | Admitting: *Deleted

## 2023-10-05 ENCOUNTER — Encounter: Payer: Self-pay | Admitting: Cardiology

## 2023-10-05 ENCOUNTER — Ambulatory Visit: Payer: Self-pay | Admitting: Physical Therapy

## 2023-10-05 DIAGNOSIS — R293 Abnormal posture: Secondary | ICD-10-CM

## 2023-10-05 DIAGNOSIS — R2689 Other abnormalities of gait and mobility: Secondary | ICD-10-CM

## 2023-10-05 DIAGNOSIS — R29898 Other symptoms and signs involving the musculoskeletal system: Secondary | ICD-10-CM

## 2023-10-05 DIAGNOSIS — R29818 Other symptoms and signs involving the nervous system: Secondary | ICD-10-CM

## 2023-10-05 DIAGNOSIS — R2681 Unsteadiness on feet: Secondary | ICD-10-CM

## 2023-10-05 DIAGNOSIS — M6281 Muscle weakness (generalized): Secondary | ICD-10-CM

## 2023-10-05 MED ORDER — FUROSEMIDE 20 MG PO TABS: 20.0000 mg | ORAL_TABLET | Freq: Two times a day (BID) | ORAL | 11 refills | Status: DC

## 2023-10-05 MED ORDER — POTASSIUM CHLORIDE CRYS ER 20 MEQ PO TBCR: 20.0000 meq | EXTENDED_RELEASE_TABLET | Freq: Two times a day (BID) | ORAL | 11 refills | Status: DC

## 2023-10-05 NOTE — Therapy (Signed)
 OUTPATIENT PHYSICAL THERAPY NEURO TREATMENT  Patient Name: Anthony Downs MRN: [id number] DOB:November 24, 1950, 73 y.o., male Today's Date: 10/05/2023   PCP: Bertell Satterfield, MD REFERRING PROVIDER: Babs Arthea DASEN, MD      END OF SESSION:  PT End of Session - 10/05/23 1052     Visit Number 33    Number of Visits 39   recert   Date for PT Re-Evaluation 11/29/23   recert   Authorization Type Medicare    PT Start Time 1050    PT Stop Time 1143    PT Time Calculation (min) 53 min    Equipment Utilized During Treatment Gait belt    Activity Tolerance Patient tolerated treatment well    Behavior During Therapy WFL for tasks assessed/performed              Past Medical History:  Diagnosis Date   Acid reflux    Anxiety    Arthritis    Asthma    as a child   Basal cell carcinoma 01/26/2021   RIGHT POST SURICULAR INFERIOR   Basal cell carcinoma 01/26/2021   RIGHT POST AURICULAR SUPERIOR   Benign brain tumor (HCC)    Chest pain    a. 09/2019 MV: No ischemia.   Complication of anesthesia    slow to wake up and pain medicines make him sick   GERD (gastroesophageal reflux disease)    History of DVT (deep vein thrombosis)    History of echocardiogram    a. 09/2019 Echo: EF 60-65%, nl RV fxn; b. 09/2022 Echo: EF of 60-65% without regional wall motion abnormalities, normal RV size/function, and borderline dilatation of the aortic root at 39 mm.   Hypercholesteremia    Hypertension    PONV (postoperative nausea and vomiting)    PVC's (premature ventricular contractions)    a. 08/2022 Zio: predominantly sinus rhythm with an average heart rate of 71 (50-150), 5 brief SVT runs (longest 10.2 seconds with max rate of 150).  3.2% PVC burden was also noted.  No triggered events.   Skin cancer    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, squamous behind ear   Sleep apnea    no Cpap   Subdural hematoma, post-traumatic (HCC) 2008   fell off truck hit head on concrete   Past Surgical  History:  Procedure Laterality Date   APPLICATION OF CRANIAL NAVIGATION Left 04/29/2020   Procedure: APPLICATION OF CRANIAL NAVIGATION;  Surgeon: Cheryle Debby LABOR, MD;  Location: [facility] OR;  Service: Neurosurgery;  Laterality: Left;   arthroscopic knee     COLONOSCOPY N/A 11/29/2018   Procedure: COLONOSCOPY;  Surgeon: Golda Claudis PENNER, MD;  Location: [facility];  Service: Endoscopy;  Laterality: N/A;  730-rescheduled 9/2 same time per Ann   CRANIOTOMY Left 04/29/2020   Procedure: LEFT CRANIECTOMY WITH TUMOR EXCISION;  Surgeon: Cheryle Debby LABOR, MD;  Location: [facility] OR;  Service: Neurosurgery;  Laterality: Left;   KNEE SURGERY Right    NASAL SEPTOPLASTY W/ TURBINOPLASTY Bilateral 03/19/2020   Procedure: NASAL SEPTOPLASTY WITH BILATERAL  TURBINATE REDUCTION;  Surgeon: Karis Clunes, MD;  Location: [facility] OR;  Service: ENT;  Laterality: Bilateral;   VASECTOMY     Patient Active Problem List   Diagnosis Date Noted   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   Current use of long term anticoagulation 05/06/2022   Seizure (HCC) 08/13/2020   Seizures (HCC) 08/12/2020   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   DVT (deep venous thrombosis) (HCC) 08/12/2020  COVID-19 virus infection 08/12/2020   Acute lower UTI 07/14/2020   Delirium 07/06/2020   Sleep apnea 07/06/2020   GERD (gastroesophageal reflux disease) 07/06/2020   Tetraplegia (HCC) 06/25/2020   Sundowning 06/25/2020   Slow transit constipation    Essential hypertension    Hypokalemia    Postoperative pain    Meningioma (HCC) 05/02/2020   Brain tumor (HCC) 04/28/2020   S/P nasal septoplasty 03/19/2020   Special screening for malignant neoplasms, colon 01/09/2018    ONSET DATE: 04/03/2023  REFERRING DIAG: G82.50 (ICD-10-CM) - Tetraplegia (HCC) D32.9 (ICD-10-CM) - Meningioma (HCC)  THERAPY DIAG:  Other abnormalities of gait and mobility  Muscle weakness (generalized)  Abnormal posture  Unsteadiness on feet  Other symptoms and signs  involving the musculoskeletal system  Other symptoms and signs involving the nervous system  Rationale for Evaluation and Treatment: Rehabilitation  SUBJECTIVE:                                                                                                                                                                                             SUBJECTIVE STATEMENT:  Pt got in/out of shower with his RW 3 times this week, 2 times last week, he and [name] are slow and cautious but he has not had any falls doing this!  Pt's goal is to walk a full lap around therapy gym (115') today, he has been walking in/out of church with his RW!  Pt accompanied by: self and family member [name]  PERTINENT HISTORY: anxiety, OA, cancer, subdural hematoma   PAIN:  Are you having pain? No  PRECAUTIONS: Fall  RED FLAGS: None   WEIGHT BEARING RESTRICTIONS: No  FALLS: Has patient fallen in last 6 months? No  LIVING ENVIRONMENT: Lives with: lives with their spouse Lives in: House/apartment Home is power wheelchair accessible Has following equipment at home: Wheelchair (power), Ramped entry, and slide board, [name], handicap accessible bathroom, hospital bed  PLOF: Independent with household mobility with device, Needs assistance with ADLs, and Needs assistance with transfers  PATIENT GOALS: stand pivot transfer take steps in // bars  OBJECTIVE:  Note: Objective measures were completed at Evaluation unless otherwise noted.  DIAGNOSTIC FINDINGS: None relevant to this POC  COGNITION: Overall cognitive status: Impaired   SENSATION: No N/T per patient report  EDEMA:  Yes in BLE, wears compression stockings  POSTURE: rounded shoulders, forward head, and posterior pelvic tilt  LOWER EXTREMITY ROM:     Passive  Right Eval Left Eval  Hip flexion Tight hip flexors Tight hip flexors  Hip extension    Hip abduction    Hip adduction  Hip internal rotation    Hip external  rotation    Knee flexion    Knee extension Tight HS Tight HS  Ankle dorsiflexion Tight gastroc Tight gastroc  Ankle plantarflexion    Ankle inversion    Ankle eversion     (Blank rows = not tested)  LOWER EXTREMITY MMT:    MMT Right Eval Left Eval  Hip flexion 2- 3  Hip extension    Hip abduction    Hip adduction    Hip internal rotation    Hip external rotation    Knee flexion 2- 3  Knee extension 2- 3  Ankle dorsiflexion 0 1  Ankle plantarflexion    Ankle inversion    Ankle eversion    (Blank rows = not tested)  BED MOBILITY:  Sit to supine Mod A Supine to sit Min A Min A for trunk for supine to sit Mod A for BLE for sit to supine  TRANSFERS: Assistive device utilized: RW  Sit to stand: SBA and CGA Stand to sit: CGA Chair to chair: CGA  VITALS  There were no vitals filed for this visit.                                                                                                                              TREATMENT:   NMR Sit to stand with SBA to bari RW during session  Gait pattern: step through pattern, decreased stride length, decreased hip/knee flexion- Right, decreased hip/knee flexion- Left, and trunk flexed Distance walked: 103' Assistive device utilized: Environmental consultant - 2 wheeled Level of assistance: SBA Comments: Pt able to ambulate close to a full lap around gym today with several short standing rest breaks w/SBA only. Provided WC follow to provide seat for pt when he needed to rest. Pt able to maintain conversation w/therapist during ambulation. He was motivated to push himself to walk a full lap but was unable to advance his RLE after 103'.   Sit to stand with SBA to ballet bar: Sidestepping L/R x 10 ft each direction with BUE support Standing tolerance 2 x 2 min at ballet bar performing alt UE Blaze pod taps with 5 pods on mirror on random setting:  Pt scores 42 hits, 48 hits on Blaze pods Unable to reach as high up with his RUE, decreased  reaction time with RUE    PATIENT EDUCATION: Education details: continue to do shower transfers with RW, continue working on sit to stands w/reduced UE support  Person educated: Patient, Public affairs consultant: Explanation, Demonstration, Tactile cues, and Verbal cues Education comprehension: verbalized understanding, returned demonstration, verbal cues required, tactile cues required, and needs further education  HOME EXERCISE PROGRAM: To be initiated as appropriate  GOALS: Goals reviewed with patient? Yes   NEW SHORT TERM GOALS:   Target date: 08/23/2023  Pt will improve 5 x STS to less than or equal to 45s to demonstrate improved functional strength and transfer efficiency.  Baseline: 54.78s (7/8); 38.5s (8/5) Goal status: MET  2.   Pt will ambulate 61' w/RW and CGA x1 for improved independence and endurance  Baseline: 58 w/RW, CGA x1 and WC follow (7/8); 46' w/SBA and WC follow on 8/5 Goal status: IN PROGRESS   NEW LONG TERM GOALS:  Target date: 09/20/2023  Pt will ambulate 50' and full 180 degree turn wRW and CGA x1 for improved endurance and independence in home environment  Baseline: 50 ft x 4 with 180 degree turn with RW and CGA (9/2) Goal status: MET  2.  Pt will take shower using shower bench and RW for improved endurance, independence w/ADLs and transfers.  Baseline: currently taking bird baths, has performed a dry run but still needs to take an actual shower (9/2)  Goal status: IN PROGRESS  NEW NEW SHORT TERM GOALS:   Target date: 10/18/2023   Pt will ambulate x 65 ft with RW and CGA for improved endurance. Baseline: 50 ft with 180 degree turn with RW and CGA (9/2); 103' w/RW and CGA (9/17) Goal status: MET  2.  Pt will take shower using shower bench and RW for improved endurance, independence w/ADLs and transfers.  Baseline: currently taking bird baths, has performed a dry run but still needs to take an actual shower (9/2)  Goal status: IN PROGRESS   NEW  NEW LONG TERM GOALS:  Target date: 11/15/2023   Pt will ambulate x 150 ft with RW and CGA for improved endurance. Baseline: 50 ft with 180 degree turn with RW and CGA (9/2); 103' w/RW and CGA (9/17) Goal status: REVISED  2.  Pt will improve 5 x STS to less than or equal to 30s to demonstrate improved functional strength and transfer efficiency.  Baseline: 54.78s (7/8); 38.5s (8/5) Goal status: INITIAL     ASSESSMENT:  CLINICAL IMPRESSION: Emphasis of skilled PT session on endurance with ambulation with focus on attempting to ambulate a full lap around therapy gym. He is able to ambulate x 103' with his RW with SBA before onset of RLE fatigue and unable to advance limb any further, however previously his longest distance ambulated without a seated rest break was 58 ft so this is a significant improvement. Upgraded his LTG to reflect this progress this session. Pt does continue to fatigue with lateral sidestepping at ballet bar but exhibits improved tolerance for standing with ability to stand 2 x 2 min at ballet bar during Blaze pod task. Additionally, he has performed at least 5 shower transfers with his RW since last visit and has safely been able to do so with assist from [place]. Bebe has made great progress during his last few PT sessions and continues to benefit from skilled PT services to work on increasing his safety and independence with functional mobility and decreasing caregiver burden. Continue POC.    OBJECTIVE IMPAIRMENTS: decreased activity tolerance, decreased balance, decreased cognition, decreased endurance, decreased knowledge of condition, decreased knowledge of use of DME, decreased mobility, difficulty walking, decreased ROM, decreased strength, increased edema, and impaired UE functional use.   ACTIVITY LIMITATIONS: carrying, lifting, bending, standing, transfers, and bed mobility  PARTICIPATION LIMITATIONS: meal prep, cleaning, laundry, and community  activity  PERSONAL FACTORS: Fitness, Time since onset of injury/illness/exacerbation, and 1-2 comorbidities:   anxiety, OA, cancer, subdural hematoma are also affecting patient's functional outcome.   REHAB POTENTIAL: Good  CLINICAL DECISION MAKING: Stable/uncomplicated  EVALUATION COMPLEXITY: High  PLAN:  PT FREQUENCY: 1-2x/week  PT DURATION: 6 weeks +  8 weeks + 8 weeks (recert) + 8 weeks (scheduled for 4, can add 4 pending progress)  PLANNED INTERVENTIONS: 97164- PT Re-evaluation, 97110-Therapeutic exercises, 97530- Therapeutic activity, 97112- Neuromuscular re-education, 97535- Self Care, [id number]- Manual therapy, [phone]- Gait training, [phone]- Orthotic Fit/training, [phone]- Electrical stimulation (manual), Patient/Family education, Balance training, Taping, Dry Needling, Joint mobilization, Cognitive remediation, DME instructions, Wheelchair mobility training, Cryotherapy, and Moist heat  PLAN FOR NEXT SESSION: Establish stretching program w/[name]. Work on Oncologist, retro gait, continue gait training w/RW, Sit to stands-from lower surfaces. EMOM training, independence w/transfers, step taps, lateral stepping at ballet bar again? 180 degree turns with RW, increasing endurance/gait distance, gait x 115 ft (full lap)  Waddell Southgate, PT Waddell Southgate, PT, DPT, CSRS    10/05/2023, 11:46 AM

## 2023-10-05 NOTE — Telephone Encounter (Signed)
 Refill complete

## 2023-10-12 ENCOUNTER — Ambulatory Visit: Payer: Self-pay | Admitting: Physical Therapy

## 2023-10-12 DIAGNOSIS — M6281 Muscle weakness (generalized): Secondary | ICD-10-CM

## 2023-10-12 DIAGNOSIS — R29818 Other symptoms and signs involving the nervous system: Secondary | ICD-10-CM

## 2023-10-12 DIAGNOSIS — R2689 Other abnormalities of gait and mobility: Secondary | ICD-10-CM | POA: Diagnosis not present

## 2023-10-12 DIAGNOSIS — R2681 Unsteadiness on feet: Secondary | ICD-10-CM

## 2023-10-12 DIAGNOSIS — R29898 Other symptoms and signs involving the musculoskeletal system: Secondary | ICD-10-CM

## 2023-10-12 DIAGNOSIS — R293 Abnormal posture: Secondary | ICD-10-CM

## 2023-10-12 NOTE — Therapy (Signed)
 OUTPATIENT PHYSICAL THERAPY NEURO TREATMENT  Patient Name: Anthony Downs MRN: [id number] DOB:Jun 22, 1950, 73 y.o., male Today's Date: 10/12/2023   PCP: Bertell Satterfield, MD REFERRING PROVIDER: Babs Arthea DASEN, MD      END OF SESSION:  PT End of Session - 10/12/23 1315     Visit Number 34    Number of Visits 39   recert   Date for Recertification  11/29/23   recert   Authorization Type Medicare    PT Start Time 1312    PT Stop Time 1358    PT Time Calculation (min) 46 min    Equipment Utilized During Treatment Gait belt    Activity Tolerance Patient tolerated treatment well    Behavior During Therapy [facility] for tasks assessed/performed               Past Medical History:  Diagnosis Date   Acid reflux    Anxiety    Arthritis    Asthma    as a child   Basal cell carcinoma 01/26/2021   RIGHT POST SURICULAR INFERIOR   Basal cell carcinoma 01/26/2021   RIGHT POST AURICULAR SUPERIOR   Benign brain tumor (HCC)    Chest pain    a. 09/2019 MV: No ischemia.   Complication of anesthesia    slow to wake up and pain medicines make him sick   GERD (gastroesophageal reflux disease)    History of DVT (deep vein thrombosis)    History of echocardiogram    a. 09/2019 Echo: EF 60-65%, nl RV fxn; b. 09/2022 Echo: EF of 60-65% without regional wall motion abnormalities, normal RV size/function, and borderline dilatation of the aortic root at 39 mm.   Hypercholesteremia    Hypertension    PONV (postoperative nausea and vomiting)    PVC's (premature ventricular contractions)    a. 08/2022 Zio: predominantly sinus rhythm with an average heart rate of 71 (50-150), 5 brief SVT runs (longest 10.2 seconds with max rate of 150).  3.2% PVC burden was also noted.  No triggered events.   Skin cancer    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, squamous behind ear   Sleep apnea    no Cpap   Subdural hematoma, post-traumatic (HCC) 2008   fell off truck hit head on concrete   Past Surgical  History:  Procedure Laterality Date   APPLICATION OF CRANIAL NAVIGATION Left 04/29/2020   Procedure: APPLICATION OF CRANIAL NAVIGATION;  Surgeon: Cheryle Debby LABOR, MD;  Location: [facility] OR;  Service: Neurosurgery;  Laterality: Left;   arthroscopic knee     COLONOSCOPY N/A 11/29/2018   Procedure: COLONOSCOPY;  Surgeon: Golda Claudis PENNER, MD;  Location: [facility] END[facility] SUITE;  Service: Endoscopy;  Laterality: N/A;  730-rescheduled 9/2 same time per Ann   CRANIOTOMY Left 04/29/2020   Procedure: LEFT CRANIECTOMY WITH TUMOR EXCISION;  Surgeon: Cheryle Debby LABOR, MD;  Location: [facility] OR;  Service: Neurosurgery;  Laterality: Left;   KNEE SURGERY Right    NASAL SEPTOPLASTY W/ TURBINOPLASTY Bilateral 03/19/2020   Procedure: NASAL SEPTOPLASTY WITH BILATERAL  TURBINATE REDUCTION;  Surgeon: Karis Clunes, MD;  Location: [facility] OR;  Service: ENT;  Laterality: Bilateral;   VASECTOMY     Patient Active Problem List   Diagnosis Date Noted   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   Current use of long term anticoagulation 05/06/2022   Seizure (HCC) 08/13/2020   Seizures (HCC) 08/12/2020   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   DVT (deep venous thrombosis) (HCC) 08/12/2020  COVID-19 virus infection 08/12/2020   Acute lower UTI 07/14/2020   Delirium 07/06/2020   Sleep apnea 07/06/2020   GERD (gastroesophageal reflux disease) 07/06/2020   Tetraplegia (HCC) 06/25/2020   Sundowning 06/25/2020   Slow transit constipation    Essential hypertension    Hypokalemia    Postoperative pain    Meningioma (HCC) 05/02/2020   Brain tumor (HCC) 04/28/2020   S/P nasal septoplasty 03/19/2020   Special screening for malignant neoplasms, colon 01/09/2018    ONSET DATE: 04/03/2023  REFERRING DIAG: G82.50 (ICD-10-CM) - Tetraplegia (HCC) D32.9 (ICD-10-CM) - Meningioma (HCC)  THERAPY DIAG:  Other abnormalities of gait and mobility  Muscle weakness (generalized)  Abnormal posture  Unsteadiness on feet  Other symptoms and signs  involving the musculoskeletal system  Other symptoms and signs involving the nervous system  Rationale for Evaluation and Treatment: Rehabilitation  SUBJECTIVE:                                                                                                                                                                                             SUBJECTIVE STATEMENT:  Pt continues to walk in/out of church with his RW. Pt continues to get in/out of shower with his RW, walking to different rooms in his house as well.  Pt didn't walk at all yesterday and did struggle walking into this bedroom last night. He feels like his RLE is solid and tight, feels less that way if he gets up and moves around more. He has not been working on his LE stretches or exercises.  Pt accompanied by: self and family member [name]  PERTINENT HISTORY: anxiety, OA, cancer, subdural hematoma   PAIN:  Are you having pain? No  PRECAUTIONS: Fall  RED FLAGS: None   WEIGHT BEARING RESTRICTIONS: No  FALLS: Has patient fallen in last 6 months? No  LIVING ENVIRONMENT: Lives with: lives with their spouse Lives in: House/apartment Home is power wheelchair accessible Has following equipment at home: Wheelchair (power), Ramped entry, and slide board, [name], handicap accessible bathroom, hospital bed  PLOF: Independent with household mobility with device, Needs assistance with ADLs, and Needs assistance with transfers  PATIENT GOALS: stand pivot transfer take steps in // bars  OBJECTIVE:  Note: Objective measures were completed at Evaluation unless otherwise noted.  DIAGNOSTIC FINDINGS: None relevant to this POC  COGNITION: Overall cognitive status: Impaired   SENSATION: No N/T per patient report  EDEMA:  Yes in BLE, wears compression stockings  POSTURE: rounded shoulders, forward head, and posterior pelvic tilt  LOWER EXTREMITY ROM:     Passive  Right Eval Left Eval  Hip  flexion Tight  hip flexors Tight hip flexors  Hip extension    Hip abduction    Hip adduction    Hip internal rotation    Hip external rotation    Knee flexion    Knee extension Tight HS Tight HS  Ankle dorsiflexion Tight gastroc Tight gastroc  Ankle plantarflexion    Ankle inversion    Ankle eversion     (Blank rows = not tested)  LOWER EXTREMITY MMT:    MMT Right Eval Left Eval  Hip flexion 2- 3  Hip extension    Hip abduction    Hip adduction    Hip internal rotation    Hip external rotation    Knee flexion 2- 3  Knee extension 2- 3  Ankle dorsiflexion 0 1  Ankle plantarflexion    Ankle inversion    Ankle eversion    (Blank rows = not tested)  BED MOBILITY:  Sit to supine Mod A Supine to sit Min A Min A for trunk for supine to sit Mod A for BLE for sit to supine  TRANSFERS: Assistive device utilized: RW  Sit to stand: SBA and CGA Stand to sit: CGA Chair to chair: CGA  VITALS  There were no vitals filed for this visit.                                                                                                                              TREATMENT:   NMR/TherAct To warm up RLE prior to ambulation while seated in PWC: Seated LAQ x 10 reps Seated marches x 10 reps (decreased ROM due to weakness) Encouraged him to work on bed level exercises prior to getting up at home Sit to stand with SBA to bari RW during session:  Gait pattern: step through pattern, decreased stride length, decreased hip/knee flexion- Right, decreased hip/knee flexion- Left, and trunk flexed Distance walked: 59 ft, 55 ft (one 2 min seated rest break in between) Assistive device utilized: Environmental consultant - 2 wheeled Level of assistance: SBA Comments: Provided WC follow to provide seat for pt when he needed to rest.   Sit to stand with SBA to ballet bar: Standing alt L/R 4 with progression to 6 step taps x 5, x 10, x 15 reps    PATIENT EDUCATION: Education details: work on LE stretches and  exercises in bed or seated prior to walking to warm-up his legs Person educated: Patient, Public affairs consultant: Explanation, Demonstration, Tactile cues, and Verbal cues Education comprehension: verbalized understanding, returned demonstration, verbal cues required, tactile cues required, and needs further education  HOME EXERCISE PROGRAM: To be initiated as appropriate  GOALS: Goals reviewed with patient? Yes   NEW SHORT TERM GOALS:   Target date: 08/23/2023  Pt will improve 5 x STS to less than or equal to 45s to demonstrate improved functional strength and transfer efficiency.  Baseline: 54.78s (7/8); 38.5s (8/5) Goal status: MET  2.   Pt  will ambulate 85' w/RW and CGA x1 for improved independence and endurance  Baseline: 58 w/RW, CGA x1 and WC follow (7/8); 46' w/SBA and WC follow on 8/5 Goal status: IN PROGRESS   NEW LONG TERM GOALS:  Target date: 09/20/2023  Pt will ambulate 50' and full 180 degree turn wRW and CGA x1 for improved endurance and independence in home environment  Baseline: 50 ft x 4 with 180 degree turn with RW and CGA (9/2) Goal status: MET  2.  Pt will take shower using shower bench and RW for improved endurance, independence w/ADLs and transfers.  Baseline: currently taking bird baths, has performed a dry run but still needs to take an actual shower (9/2)  Goal status: IN PROGRESS  NEW NEW SHORT TERM GOALS:   Target date: 10/18/2023   Pt will ambulate x 65 ft with RW and CGA for improved endurance. Baseline: 50 ft with 180 degree turn with RW and CGA (9/2); 103' w/RW and CGA (9/17) Goal status: MET  2.  Pt will take shower using shower bench and RW for improved endurance, independence w/ADLs and transfers.  Baseline: currently taking bird baths, has performed a dry run but still needs to take an actual shower (9/2)  Goal status: IN PROGRESS   NEW NEW LONG TERM GOALS:  Target date: 11/15/2023   Pt will ambulate x 150 ft with RW and CGA for  improved endurance. Baseline: 50 ft with 180 degree turn with RW and CGA (9/2); 103' w/RW and CGA (9/17) Goal status: REVISED  2.  Pt will improve 5 x STS to less than or equal to 30s to demonstrate improved functional strength and transfer efficiency.  Baseline: 54.78s (7/8); 38.5s (8/5) Goal status: INITIAL     ASSESSMENT:  CLINICAL IMPRESSION: Emphasis of skilled PT session on continuing to work on improving his endurance with ambulation around therapy gym. He does need a seated rest break after about half a lap this session but is able complete the lap after a 2 min seated rest break. He exhibits improved ability to perform 6 step taps this date with increase in reps as sets progress. He does have some difficulty with R hip flexion and clearing step that increases with onset of fatigue. He continues to benefit from skilled PT services to work on increasing his safety and independence with functional mobility and decreasing caregiver burden. Continue POC.    OBJECTIVE IMPAIRMENTS: decreased activity tolerance, decreased balance, decreased cognition, decreased endurance, decreased knowledge of condition, decreased knowledge of use of DME, decreased mobility, difficulty walking, decreased ROM, decreased strength, increased edema, and impaired UE functional use.   ACTIVITY LIMITATIONS: carrying, lifting, bending, standing, transfers, and bed mobility  PARTICIPATION LIMITATIONS: meal prep, cleaning, laundry, and community activity  PERSONAL FACTORS: Fitness, Time since onset of injury/illness/exacerbation, and 1-2 comorbidities:   anxiety, OA, cancer, subdural hematoma are also affecting patient's functional outcome.   REHAB POTENTIAL: Good  CLINICAL DECISION MAKING: Stable/uncomplicated  EVALUATION COMPLEXITY: High  PLAN:  PT FREQUENCY: 1-2x/week  PT DURATION: 6 weeks + 8 weeks + 8 weeks (recert) + 8 weeks (scheduled for 4, can add 4 pending progress)  PLANNED INTERVENTIONS:  02835- PT Re-evaluation, 97110-Therapeutic exercises, 97530- Therapeutic activity, 97112- Neuromuscular re-education, 97535- Self Care, [id number]- Manual therapy, [phone]- Gait training, [phone]- Orthotic Fit/training, [phone]- Electrical stimulation (manual), Patient/Family education, Balance training, Taping, Dry Needling, Joint mobilization, Cognitive remediation, DME instructions, Wheelchair mobility training, Cryotherapy, and Moist heat  PLAN FOR NEXT SESSION: how is stretching program  w/[name]?. Work on Oncologist, retro gait, continue gait training w/RW, Sit to stands-from lower surfaces. EMOM training, independence w/transfers, step taps, lateral stepping at ballet bar again? 180 degree turns with RW, increasing endurance/gait distance, gait x 115 ft (full lap)  Waddell Southgate, PT Waddell Southgate, PT, DPT, CSRS    10/12/2023, 1:58 PM

## 2023-10-17 ENCOUNTER — Other Ambulatory Visit (HOSPITAL_BASED_OUTPATIENT_CLINIC_OR_DEPARTMENT_OTHER): Payer: Self-pay

## 2023-10-19 ENCOUNTER — Ambulatory Visit: Payer: Self-pay | Attending: Physical Medicine & Rehabilitation | Admitting: Physical Therapy

## 2023-10-19 DIAGNOSIS — R2681 Unsteadiness on feet: Secondary | ICD-10-CM | POA: Insufficient documentation

## 2023-10-19 DIAGNOSIS — R29898 Other symptoms and signs involving the musculoskeletal system: Secondary | ICD-10-CM | POA: Insufficient documentation

## 2023-10-19 DIAGNOSIS — M6281 Muscle weakness (generalized): Secondary | ICD-10-CM | POA: Diagnosis present

## 2023-10-19 DIAGNOSIS — R29818 Other symptoms and signs involving the nervous system: Secondary | ICD-10-CM | POA: Insufficient documentation

## 2023-10-19 DIAGNOSIS — R293 Abnormal posture: Secondary | ICD-10-CM | POA: Insufficient documentation

## 2023-10-19 DIAGNOSIS — R2689 Other abnormalities of gait and mobility: Secondary | ICD-10-CM | POA: Diagnosis present

## 2023-10-19 NOTE — Therapy (Signed)
 OUTPATIENT PHYSICAL THERAPY NEURO TREATMENT  Patient Name: Anthony Downs MRN: [id number] DOB:1950/07/06, 73 y.o., male Today's Date: 10/19/2023   PCP: Bertell Satterfield, MD REFERRING PROVIDER: Babs Arthea DASEN, MD      END OF SESSION:  PT End of Session - 10/19/23 1326     Visit Number 35    Number of Visits 39   recert   Date for Recertification  11/29/23   recert   Authorization Type Medicare    PT Start Time 1315    PT Stop Time 1400    PT Time Calculation (min) 45 min    Equipment Utilized During Treatment Gait belt    Activity Tolerance Patient tolerated treatment well    Behavior During Therapy [facility] for tasks assessed/performed                Past Medical History:  Diagnosis Date   Acid reflux    Anxiety    Arthritis    Asthma    as a child   Basal cell carcinoma 01/26/2021   RIGHT POST SURICULAR INFERIOR   Basal cell carcinoma 01/26/2021   RIGHT POST AURICULAR SUPERIOR   Benign brain tumor (HCC)    Chest pain    a. 09/2019 MV: No ischemia.   Complication of anesthesia    slow to wake up and pain medicines make him sick   GERD (gastroesophageal reflux disease)    History of DVT (deep vein thrombosis)    History of echocardiogram    a. 09/2019 Echo: EF 60-65%, nl RV fxn; b. 09/2022 Echo: EF of 60-65% without regional wall motion abnormalities, normal RV size/function, and borderline dilatation of the aortic root at 39 mm.   Hypercholesteremia    Hypertension    PONV (postoperative nausea and vomiting)    PVC's (premature ventricular contractions)    a. 08/2022 Zio: predominantly sinus rhythm with an average heart rate of 71 (50-150), 5 brief SVT runs (longest 10.2 seconds with max rate of 150).  3.2% PVC burden was also noted.  No triggered events.   Skin cancer    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, squamous behind ear   Sleep apnea    no Cpap   Subdural hematoma, post-traumatic (HCC) 2008   fell off truck hit head on concrete   Past Surgical  History:  Procedure Laterality Date   APPLICATION OF CRANIAL NAVIGATION Left 04/29/2020   Procedure: APPLICATION OF CRANIAL NAVIGATION;  Surgeon: Cheryle Debby LABOR, MD;  Location: [facility] OR;  Service: Neurosurgery;  Laterality: Left;   arthroscopic knee     COLONOSCOPY N/A 11/29/2018   Procedure: COLONOSCOPY;  Surgeon: Golda Claudis PENNER, MD;  Location: [facility] ENDO SUITE;  Service: Endoscopy;  Laterality: N/A;  730-rescheduled 9/2 same time per Ann   CRANIOTOMY Left 04/29/2020   Procedure: LEFT CRANIECTOMY WITH TUMOR EXCISION;  Surgeon: Cheryle Debby LABOR, MD;  Location: [facility] OR;  Service: Neurosurgery;  Laterality: Left;   KNEE SURGERY Right    NASAL SEPTOPLASTY W/ TURBINOPLASTY Bilateral 03/19/2020   Procedure: NASAL SEPTOPLASTY WITH BILATERAL  TURBINATE REDUCTION;  Surgeon: Karis Clunes, MD;  Location: [facility] OR;  Service: ENT;  Laterality: Bilateral;   VASECTOMY     Patient Active Problem List   Diagnosis Date Noted   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   Current use of long term anticoagulation 05/06/2022   Seizure (HCC) 08/13/2020   Seizures (HCC) 08/12/2020   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   DVT (deep venous thrombosis) (HCC)  08/12/2020   COVID-19 virus infection 08/12/2020   Acute lower UTI 07/14/2020   Delirium 07/06/2020   Sleep apnea 07/06/2020   GERD (gastroesophageal reflux disease) 07/06/2020   Tetraplegia (HCC) 06/25/2020   Sundowning 06/25/2020   Slow transit constipation    Essential hypertension    Hypokalemia    Postoperative pain    Meningioma (HCC) 05/02/2020   Brain tumor (HCC) 04/28/2020   S/P nasal septoplasty 03/19/2020   Special screening for malignant neoplasms, colon 01/09/2018    ONSET DATE: 04/03/2023  REFERRING DIAG: G82.50 (ICD-10-CM) - Tetraplegia (HCC) D32.9 (ICD-10-CM) - Meningioma (HCC)  THERAPY DIAG:  Other abnormalities of gait and mobility  Muscle weakness (generalized)  Abnormal posture  Unsteadiness on feet  Other symptoms and signs  involving the musculoskeletal system  Other symptoms and signs involving the nervous system  Rationale for Evaluation and Treatment: Rehabilitation  SUBJECTIVE:                                                                                                                                                                                             SUBJECTIVE STATEMENT:  Pt denies any acute changes since last visit. He is now doing everything for himself in the shower for the most part. He is able to stand at his RW without arm support for a few seconds.  Pt accompanied by: self and family member [name]  PERTINENT HISTORY: anxiety, OA, cancer, subdural hematoma   PAIN:  Are you having pain? No  PRECAUTIONS: Fall  RED FLAGS: None   WEIGHT BEARING RESTRICTIONS: No  FALLS: Has patient fallen in last 6 months? No  LIVING ENVIRONMENT: Lives with: lives with their spouse Lives in: House/apartment Home is power wheelchair accessible Has following equipment at home: Wheelchair (power), Ramped entry, and slide board, [name], handicap accessible bathroom, hospital bed  PLOF: Independent with household mobility with device, Needs assistance with ADLs, and Needs assistance with transfers  PATIENT GOALS: stand pivot transfer take steps in // bars  OBJECTIVE:  Note: Objective measures were completed at Evaluation unless otherwise noted.  DIAGNOSTIC FINDINGS: None relevant to this POC  COGNITION: Overall cognitive status: Impaired   SENSATION: No N/T per patient report  EDEMA:  Yes in BLE, wears compression stockings  POSTURE: rounded shoulders, forward head, and posterior pelvic tilt  LOWER EXTREMITY ROM:     Passive  Right Eval Left Eval  Hip flexion Tight hip flexors Tight hip flexors  Hip extension    Hip abduction    Hip adduction    Hip internal rotation    Hip external rotation    Knee flexion  Knee extension Tight HS Tight HS  Ankle dorsiflexion  Tight gastroc Tight gastroc  Ankle plantarflexion    Ankle inversion    Ankle eversion     (Blank rows = not tested)  LOWER EXTREMITY MMT:    MMT Right Eval Left Eval  Hip flexion 2- 3  Hip extension    Hip abduction    Hip adduction    Hip internal rotation    Hip external rotation    Knee flexion 2- 3  Knee extension 2- 3  Ankle dorsiflexion 0 1  Ankle plantarflexion    Ankle inversion    Ankle eversion    (Blank rows = not tested)  BED MOBILITY:  Sit to supine Mod A Supine to sit Min A Min A for trunk for supine to sit Mod A for BLE for sit to supine  TRANSFERS: Assistive device utilized: RW  Sit to stand: SBA and CGA Stand to sit: CGA Chair to chair: CGA  VITALS  There were no vitals filed for this visit.                                                                                                                              TREATMENT:   NMR/TherAct Sit to stand with SBA to bari RW during session:  Gait pattern: step through pattern, decreased stride length, decreased hip/knee flexion- Right, decreased hip/knee flexion- Left, and trunk flexed Distance walked: 105 ft Assistive device utilized: Walker - 2 wheeled Level of assistance: SBA to CGA towards end due to decreased RLE clearance and step length Comments: Provided WC follow to provide seat for pt when he needed to rest.   Sit to stand with SBA to ballet bar: 6 Blaze pods on random setting with 3 on the floor and 3 on the mirror for improved standing tolerance, functional LE strengthening, and endurance training.  Performed on 2 minute intervals with 30 rest periods.  Pt requires CGA guarding. Round 1:  6 hits. Round 2:  12 hits. Round 3:   9 hits. Pt given 5 min seated rest breaks in between each rouch Notable errors/deficits:  decreased ability to hit targets with RLE due to weakness  Standing balance performing ball toss x 20 reps (stands for about 1 min) with no UE support and CGA for  balance    PATIENT EDUCATION: Education details: continue to work on LE stretches, walking at home, shower transfers, and standing with decreased UE support as safe and able Person educated: Patient, Animal nutritionist method: Explanation, Demonstration, Tactile cues, and Verbal cues Education comprehension: verbalized understanding, returned demonstration, verbal cues required, tactile cues required, and needs further education  HOME EXERCISE PROGRAM: To be initiated as appropriate  GOALS: Goals reviewed with patient? Yes   NEW SHORT TERM GOALS:   Target date: 08/23/2023  Pt will improve 5 x STS to less than or equal to 45s to demonstrate improved functional strength and transfer efficiency.  Baseline:  54.78s (7/8); 38.5s (8/5) Goal status: MET  2.   Pt will ambulate 4' w/RW and CGA x1 for improved independence and endurance  Baseline: 58 w/RW, CGA x1 and WC follow (7/8); 46' w/SBA and WC follow on 8/5 Goal status: IN PROGRESS   NEW LONG TERM GOALS:  Target date: 09/20/2023  Pt will ambulate 50' and full 180 degree turn wRW and CGA x1 for improved endurance and independence in home environment  Baseline: 50 ft x 4 with 180 degree turn with RW and CGA (9/2) Goal status: MET  2.  Pt will take shower using shower bench and RW for improved endurance, independence w/ADLs and transfers.  Baseline: currently taking bird baths, has performed a dry run but still needs to take an actual shower (9/2)  Goal status: IN PROGRESS  NEW NEW SHORT TERM GOALS:   Target date: 10/18/2023   Pt will ambulate x 65 ft with RW and CGA for improved endurance. Baseline: 50 ft with 180 degree turn with RW and CGA (9/2); 103' w/RW and CGA (9/17), 105 ft with RW and CGA (10/1) Goal status: MET  2.  Pt will take shower using shower bench and RW for improved endurance, independence w/ADLs and transfers.  Baseline: currently taking bird baths, has performed a dry run but still needs to take an actual  shower (9/2), performing shower transfers and bathing mostly independently (10/1)  Goal status: MET   NEW NEW LONG TERM GOALS:  Target date: 11/15/2023   Pt will ambulate x 150 ft with RW and CGA for improved endurance. Baseline: 50 ft with 180 degree turn with RW and CGA (9/2); 103' w/RW and CGA (9/17), 105 ft with RW and CGA (10/1) Goal status: REVISED  2.  Pt will improve 5 x STS to less than or equal to 30s to demonstrate improved functional strength and transfer efficiency.  Baseline: 54.78s (7/8); 38.5s (8/5) Goal status: INITIAL     ASSESSMENT:  CLINICAL IMPRESSION: Emphasis of skilled PT session on assessing STG, continuing to work on improving his endurance with ambulation around therapy gym, and working on standing endurance/tolerance with decreased UE support. He has met 2/2 STG due to ambulating up to 105 ft before needing a rest break (exceeding his goal of 50 ft) and due to performing real shower transfers at home consistently. He continues to be limited by RLE weakness with difficulty with R hip flexion and clearing step that increases with onset of fatigue. He continues to benefit from skilled PT services to work on increasing his safety and independence with functional mobility and decreasing caregiver burden. Continue POC.    OBJECTIVE IMPAIRMENTS: decreased activity tolerance, decreased balance, decreased cognition, decreased endurance, decreased knowledge of condition, decreased knowledge of use of DME, decreased mobility, difficulty walking, decreased ROM, decreased strength, increased edema, and impaired UE functional use.   ACTIVITY LIMITATIONS: carrying, lifting, bending, standing, transfers, and bed mobility  PARTICIPATION LIMITATIONS: meal prep, cleaning, laundry, and community activity  PERSONAL FACTORS: Fitness, Time since onset of injury/illness/exacerbation, and 1-2 comorbidities:   anxiety, OA, cancer, subdural hematoma are also affecting patient's  functional outcome.   REHAB POTENTIAL: Good  CLINICAL DECISION MAKING: Stable/uncomplicated  EVALUATION COMPLEXITY: High  PLAN:  PT FREQUENCY: 1-2x/week  PT DURATION: 6 weeks + 8 weeks + 8 weeks (recert) + 8 weeks (scheduled for 4, can add 4 pending progress)  PLANNED INTERVENTIONS: [id number]- PT Re-evaluation, 97110-Therapeutic exercises, 97530- Therapeutic activity, [id number]- Neuromuscular re-education, 97535- Self Care, [id number]- Manual therapy, [id number]- Gait  training, [id number]- Orthotic Fit/training, [id number]- Electrical stimulation (manual), Patient/Family education, Balance training, Taping, Dry Needling, Joint mobilization, Cognitive remediation, DME instructions, Wheelchair mobility training, Cryotherapy, and Moist heat  PLAN FOR NEXT SESSION: how is stretching program w/[name]?. Work on Oncologist, retro gait, continue gait training w/RW, Sit to stands-from lower surfaces. EMOM training, independence w/transfers, step taps, lateral stepping at ballet bar again? 180 degree turns with RW, increasing endurance/gait distance, gait x 115 ft (full lap), Blaze pods at ballet bar, standing tasks with decreased UE support (ball toss, dowel rod volleyball), RLE strengthening (staggered stance?)  Waddell Southgate, PT Waddell Southgate, PT, DPT, CSRS    10/19/2023, 2:00 PM

## 2023-10-20 ENCOUNTER — Other Ambulatory Visit (HOSPITAL_BASED_OUTPATIENT_CLINIC_OR_DEPARTMENT_OTHER): Payer: Self-pay

## 2023-10-20 MED ORDER — FLUZONE HIGH-DOSE 0.5 ML IM SUSY
0.5000 mL | PREFILLED_SYRINGE | Freq: Once | INTRAMUSCULAR | 0 refills | Status: AC
  Filled 2023-10-20: qty 0.5, 1d supply, fill #0

## 2023-10-25 ENCOUNTER — Other Ambulatory Visit (HOSPITAL_BASED_OUTPATIENT_CLINIC_OR_DEPARTMENT_OTHER): Payer: Self-pay

## 2023-10-25 ENCOUNTER — Ambulatory Visit: Payer: Self-pay | Admitting: Physical Therapy

## 2023-10-25 DIAGNOSIS — M6281 Muscle weakness (generalized): Secondary | ICD-10-CM

## 2023-10-25 DIAGNOSIS — R2681 Unsteadiness on feet: Secondary | ICD-10-CM

## 2023-10-25 DIAGNOSIS — R293 Abnormal posture: Secondary | ICD-10-CM

## 2023-10-25 DIAGNOSIS — R2689 Other abnormalities of gait and mobility: Secondary | ICD-10-CM | POA: Diagnosis not present

## 2023-10-25 NOTE — Therapy (Signed)
 OUTPATIENT PHYSICAL THERAPY NEURO TREATMENT  Patient Name: VISHAL SANDLIN MRN: [id number] DOB:27-Jan-1950, 73 y.o., male Today's Date: 10/25/2023   PCP: Bertell Satterfield, MD REFERRING PROVIDER: Babs Arthea DASEN, MD      END OF SESSION:  PT End of Session - 10/25/23 1534     Visit Number 36    Number of Visits 39   recert   Date for Recertification  11/29/23   recert   Authorization Type Medicare    PT Start Time 1533    PT Stop Time 1621    PT Time Calculation (min) 48 min    Equipment Utilized During Treatment Gait belt    Activity Tolerance Patient tolerated treatment well    Behavior During Therapy [facility] for tasks assessed/performed                Past Medical History:  Diagnosis Date   Acid reflux    Anxiety    Arthritis    Asthma    as a child   Basal cell carcinoma 01/26/2021   RIGHT POST SURICULAR INFERIOR   Basal cell carcinoma 01/26/2021   RIGHT POST AURICULAR SUPERIOR   Benign brain tumor (HCC)    Chest pain    a. 09/2019 MV: No ischemia.   Complication of anesthesia    slow to wake up and pain medicines make him sick   GERD (gastroesophageal reflux disease)    History of DVT (deep vein thrombosis)    History of echocardiogram    a. 09/2019 Echo: EF 60-65%, nl RV fxn; b. 09/2022 Echo: EF of 60-65% without regional wall motion abnormalities, normal RV size/function, and borderline dilatation of the aortic root at 39 mm.   Hypercholesteremia    Hypertension    PONV (postoperative nausea and vomiting)    PVC's (premature ventricular contractions)    a. 08/2022 Zio: predominantly sinus rhythm with an average heart rate of 71 (50-150), 5 brief SVT runs (longest 10.2 seconds with max rate of 150).  3.2% PVC burden was also noted.  No triggered events.   Skin cancer    skin cancer - basal cell on head, squamous behind ear   Sleep apnea    no Cpap   Subdural hematoma, post-traumatic (HCC) 2008   fell off truck hit head on concrete   Past Surgical  History:  Procedure Laterality Date   APPLICATION OF CRANIAL NAVIGATION Left 04/29/2020   Procedure: APPLICATION OF CRANIAL NAVIGATION;  Surgeon: Cheryle Debby LABOR, MD;  Location: [facility] OR;  Service: Neurosurgery;  Laterality: Left;   arthroscopic knee     COLONOSCOPY N/A 11/29/2018   Procedure: COLONOSCOPY;  Surgeon: Golda Claudis PENNER, MD;  Location: [facility];  Service: Endoscopy;  Laterality: N/A;  730-rescheduled 9/2 same time per Ann   CRANIOTOMY Left 04/29/2020   Procedure: LEFT CRANIECTOMY WITH TUMOR EXCISION;  Surgeon: Cheryle Debby LABOR, MD;  [facility] OR;  Service: Neurosurgery;  Laterality: Left;   KNEE SURGERY Right    NASAL SEPTOPLASTY W/ TURBINOPLASTY Bilateral 03/19/2020   Procedure: NASAL SEPTOPLASTY WITH BILATERAL  TURBINATE REDUCTION;  Surgeon: Karis Clunes, MD;  Location: [facility] OR;  Service: ENT;  Laterality: Bilateral;   VASECTOMY     Patient Active Problem List   Diagnosis Date Noted   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   Current use of long term anticoagulation 05/06/2022   Seizure (HCC) 08/13/2020   Seizures (HCC) 08/12/2020   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   DVT (deep venous thrombosis) (HCC)  08/12/2020   COVID-19 virus infection 08/12/2020   Acute lower UTI 07/14/2020   Delirium 07/06/2020   Sleep apnea 07/06/2020   GERD (gastroesophageal reflux disease) 07/06/2020   Tetraplegia (HCC) 06/25/2020   Sundowning 06/25/2020   Slow transit constipation    Essential hypertension    Hypokalemia    Postoperative pain    Meningioma (HCC) 05/02/2020   Brain tumor (HCC) 04/28/2020   S/P nasal septoplasty 03/19/2020   Special screening for malignant neoplasms, colon 01/09/2018    ONSET DATE: 04/03/2023  REFERRING DIAG: G82.50 (ICD-10-CM) - Tetraplegia (HCC) D32.9 (ICD-10-CM) - Meningioma (HCC)  THERAPY DIAG:  Other abnormalities of gait and mobility  Muscle weakness (generalized)  Abnormal posture  Unsteadiness on feet  Rationale for Evaluation  and Treatment: Rehabilitation  SUBJECTIVE:                                                                                                                                                                                             SUBJECTIVE STATEMENT:  Pt denies any acute changes since last visit. Still showering on his own. Was really proud of himself after last session, surprised at how much he was able to do.   Pt accompanied by: self and family member [name]  PERTINENT HISTORY: anxiety, OA, cancer, subdural hematoma   PAIN:  Are you having pain? No  PRECAUTIONS: Fall  RED FLAGS: None   WEIGHT BEARING RESTRICTIONS: No  FALLS: Has patient fallen in last 6 months? No  LIVING ENVIRONMENT: Lives with: lives with their spouse Lives in: House/apartment Home is power wheelchair accessible Has following equipment at home: Wheelchair (power), Ramped entry, and slide board, [name], handicap accessible bathroom, hospital bed  PLOF: Independent with household mobility with device, Needs assistance with ADLs, and Needs assistance with transfers  PATIENT GOALS: stand pivot transfer take steps in // bars  OBJECTIVE:  Note: Objective measures were completed at Evaluation unless otherwise noted.  DIAGNOSTIC FINDINGS: None relevant to this POC  COGNITION: Overall cognitive status: Impaired   SENSATION: No N/T per patient report  EDEMA:  Yes in BLE, wears compression stockings  POSTURE: rounded shoulders, forward head, and posterior pelvic tilt  LOWER EXTREMITY ROM:     Passive  Right Eval Left Eval  Hip flexion Tight hip flexors Tight hip flexors  Hip extension    Hip abduction    Hip adduction    Hip internal rotation    Hip external rotation    Knee flexion    Knee extension Tight HS Tight HS  Ankle dorsiflexion Tight gastroc Tight gastroc  Ankle plantarflexion    Ankle inversion  Ankle eversion     (Blank rows = not tested)  LOWER EXTREMITY MMT:     MMT Right Eval Left Eval  Hip flexion 2- 3  Hip extension    Hip abduction    Hip adduction    Hip internal rotation    Hip external rotation    Knee flexion 2- 3  Knee extension 2- 3  Ankle dorsiflexion 0 1  Ankle plantarflexion    Ankle inversion    Ankle eversion    (Blank rows = not tested)  BED MOBILITY:  Sit to supine Mod A Supine to sit Min A Min A for trunk for supine to sit Mod A for BLE for sit to supine  TRANSFERS: Assistive device utilized: RW  Sit to stand: SBA and CGA Stand to sit: CGA Chair to chair: CGA  VITALS  There were no vitals filed for this visit.                                                                                                                              TREATMENT:   NMR/TherAct/Gait Training  Sit to stand with SBA to bari RW during session At ballet bar, pt performed 4 step ups w/BUE support on RW for improved step clearance and BLE strength: Round 1: Pt performed x6 reps w/CGA x2. Min cues to lead w/LLE to ascend and RLE to descend. Pt tends to place RLE in midline when stepping down, so cued for pt to activate abductors (kick out and back) for wider BOS.  Round 2: Pt performed 6 reps w/CGA-min A x2. Pt alternated his feet to ascend and descend this round and had increased difficulty clearing RLE. RPE of  8/10 following round  Round 3: Pt performed 6 reps w/min A x2 due to fatigue, alternating his feet each round. Pt strongest when leading w/LLE to ascend and to descend.  Discussed trying car transfer in future session and educated pt and [name] on obtaining a car cane to provide BUE support during transfer. Also discussed finding a way to transport pt's WC in [place] and [place] states she will look into a lift.   Gait pattern: step through pattern, decreased stride length, decreased hip/knee flexion- Right, decreased hip/knee flexion- Left, and trunk flexed Distance walked: 50', 64' Assistive device utilized: Environmental consultant -  2 wheeled Level of assistance: SBA w/WC follow  Comments: Provided WC follow to provide seat for pt when he needed to rest. Pt ambulated at end of session to work on improved endurance when fatigued.      PATIENT EDUCATION: Education details: continue to work on LE stretches, walking at home, shower transfers, bringing car to future session, obtaining a car cane and lift to transport power chair in [place]  Person educated: Patient, [name] Education method: Explanation, Demonstration, Tactile cues, and Verbal cues Education comprehension: verbalized understanding, returned demonstration, verbal cues required, tactile cues required, and needs further education  HOME EXERCISE  PROGRAM: To be initiated as appropriate  GOALS: Goals reviewed with patient? Yes   NEW SHORT TERM GOALS:   Target date: 08/23/2023  Pt will improve 5 x STS to less than or equal to 45s to demonstrate improved functional strength and transfer efficiency.  Baseline: 54.78s (7/8); 38.5s (8/5) Goal status: MET  2.   Pt will ambulate 17' w/RW and CGA x1 for improved independence and endurance  Baseline: 58 w/RW, CGA x1 and WC follow (7/8); 46' w/SBA and WC follow on 8/5 Goal status: IN PROGRESS   NEW LONG TERM GOALS:  Target date: 09/20/2023  Pt will ambulate 50' and full 180 degree turn wRW and CGA x1 for improved endurance and independence in home environment  Baseline: 50 ft x 4 with 180 degree turn with RW and CGA (9/2) Goal status: MET  2.  Pt will take shower using shower bench and RW for improved endurance, independence w/ADLs and transfers.  Baseline: currently taking bird baths, has performed a dry run but still needs to take an actual shower (9/2)  Goal status: IN PROGRESS  NEW NEW SHORT TERM GOALS:   Target date: 10/18/2023   Pt will ambulate x 65 ft with RW and CGA for improved endurance. Baseline: 50 ft with 180 degree turn with RW and CGA (9/2); 103' w/RW and CGA (9/17), 105 ft with RW and  CGA (10/1) Goal status: MET  2.  Pt will take shower using shower bench and RW for improved endurance, independence w/ADLs and transfers.  Baseline: currently taking bird baths, has performed a dry run but still needs to take an actual shower (9/2), performing shower transfers and bathing mostly independently (10/1)  Goal status: MET   NEW NEW LONG TERM GOALS:  Target date: 11/15/2023   Pt will ambulate x 150 ft with RW and CGA for improved endurance. Baseline: 50 ft with 180 degree turn with RW and CGA (9/2); 103' w/RW and CGA (9/17), 105 ft with RW and CGA (10/1) Goal status: REVISED  2.  Pt will improve 5 x STS to less than or equal to 30s to demonstrate improved functional strength and transfer efficiency.  Baseline: 54.78s (7/8); 38.5s (8/5) Goal status: INITIAL     ASSESSMENT:  CLINICAL IMPRESSION: Emphasis of skilled PT session on improved step clearance, single leg stability, cardiovascular endurance and stair navigation. Pt able to step up/down a 4 step 18 times today w/BUE support and CGA-min A x2, alternating BLEs each round. Pt also able to walk 50' and 65' following stair activity, indicative of improved stamina and functional strength. Pt encouraged to bring his Sergio traverse to future session to work on car transfers and [name] was educated on accessories to get to improve safety of transfer and transport pt's WC. Continue POC.    OBJECTIVE IMPAIRMENTS: decreased activity tolerance, decreased balance, decreased cognition, decreased endurance, decreased knowledge of condition, decreased knowledge of use of DME, decreased mobility, difficulty walking, decreased ROM, decreased strength, increased edema, and impaired UE functional use.   ACTIVITY LIMITATIONS: carrying, lifting, bending, standing, transfers, and bed mobility  PARTICIPATION LIMITATIONS: meal prep, cleaning, laundry, and community activity  PERSONAL FACTORS: Fitness, Time since onset of  injury/illness/exacerbation, and 1-2 comorbidities:   anxiety, OA, cancer, subdural hematoma are also affecting patient's functional outcome.   REHAB POTENTIAL: Good  CLINICAL DECISION MAKING: Stable/uncomplicated  EVALUATION COMPLEXITY: High  PLAN:  PT FREQUENCY: 1-2x/week  PT DURATION: 6 weeks + 8 weeks + 8 weeks (recert) + 8 weeks (scheduled for  4, can add 4 pending progress)  PLANNED INTERVENTIONS: [id number]- PT Re-evaluation, 97110-Therapeutic exercises, 97530- Therapeutic activity, 97112- Neuromuscular re-education, 97535- Self Care, [id number]- Manual therapy, [phone]- Gait training, [phone]- Orthotic Fit/training, [phone]- Electrical stimulation (manual), Patient/Family education, Balance training, Taping, Dry Needling, Joint mobilization, Cognitive remediation, DME instructions, Wheelchair mobility training, Cryotherapy, and Moist heat  PLAN FOR NEXT SESSION: how is stretching program w/[name]?. Work on Oncologist, retro gait, continue gait training w/RW, Sit to stands-from lower surfaces. EMOM training, independence w/transfers, step taps, lateral stepping at ballet bar again? 180 degree turns with RW, increasing endurance/gait distance, gait x 115 ft (full lap), Blaze pods at ballet bar, standing tasks with decreased UE support (ball toss, dowel rod volleyball), RLE strengthening (staggered stance?). Car Transfer  Marlon BRAVO Macklen Wilhoite, PT, DPT  10/25/2023, 4:23 PM

## 2023-11-03 ENCOUNTER — Ambulatory Visit: Payer: Self-pay | Admitting: Physical Therapy

## 2023-11-03 ENCOUNTER — Encounter (INDEPENDENT_AMBULATORY_CARE_PROVIDER_SITE_OTHER): Payer: Self-pay | Admitting: *Deleted

## 2023-11-03 DIAGNOSIS — M6281 Muscle weakness (generalized): Secondary | ICD-10-CM

## 2023-11-03 DIAGNOSIS — R2681 Unsteadiness on feet: Secondary | ICD-10-CM

## 2023-11-03 DIAGNOSIS — R2689 Other abnormalities of gait and mobility: Secondary | ICD-10-CM

## 2023-11-03 DIAGNOSIS — R29898 Other symptoms and signs involving the musculoskeletal system: Secondary | ICD-10-CM

## 2023-11-03 DIAGNOSIS — R29818 Other symptoms and signs involving the nervous system: Secondary | ICD-10-CM

## 2023-11-03 DIAGNOSIS — R293 Abnormal posture: Secondary | ICD-10-CM

## 2023-11-03 NOTE — Therapy (Signed)
 OUTPATIENT PHYSICAL THERAPY NEURO TREATMENT  Patient Name: Anthony Downs MRN: [id number] DOB:02-01-1950, 73 y.o., male Today's Date: 11/03/2023   PCP: Anthony Satterfield, MD REFERRING PROVIDER: Babs Arthea DASEN, MD      END OF SESSION:  PT End of Session - 11/03/23 1620     Visit Number 37    Number of Visits 39   recert   Date for Recertification  11/29/23   recert   Authorization Type Medicare    PT Start Time 1532    PT Stop Time 1620    PT Time Calculation (min) 48 min    Equipment Utilized During Treatment Gait belt    Activity Tolerance Patient tolerated treatment well    Behavior During Therapy [facility] for tasks assessed/performed                 Past Medical History:  Diagnosis Date   Acid reflux    Anxiety    Arthritis    Asthma    as a child   Basal cell carcinoma 01/26/2021   RIGHT POST SURICULAR INFERIOR   Basal cell carcinoma 01/26/2021   RIGHT POST AURICULAR SUPERIOR   Benign brain tumor (HCC)    Chest pain    a. 09/2019 MV: No ischemia.   Complication of anesthesia    slow to wake up and pain medicines make him sick   GERD (gastroesophageal reflux disease)    History of DVT (deep vein thrombosis)    History of echocardiogram    a. 09/2019 Echo: EF 60-65%, nl RV fxn; b. 09/2022 Echo: EF of 60-65% without regional wall motion abnormalities, normal RV size/function, and borderline dilatation of the aortic root at 39 mm.   Hypercholesteremia    Hypertension    PONV (postoperative nausea and vomiting)    PVC's (premature ventricular contractions)    a. 08/2022 Zio: predominantly sinus rhythm with an average heart rate of 71 (50-150), 5 brief SVT runs (longest 10.2 seconds with max rate of 150).  3.2% PVC burden was also noted.  No triggered events.   Skin cancer    skin cancer - basal cell on head, squamous behind ear   Sleep apnea    no Cpap   Subdural hematoma, post-traumatic (HCC) 2008   fell off truck hit head on concrete   Past  Surgical History:  Procedure Laterality Date   APPLICATION OF CRANIAL NAVIGATION Left 04/29/2020   Procedure: APPLICATION OF CRANIAL NAVIGATION;  Surgeon: Anthony Debby LABOR, MD;  Location: [facility] OR;  Service: Neurosurgery;  Laterality: Left;   arthroscopic knee     COLONOSCOPY N/A 11/29/2018   Procedure: COLONOSCOPY;  Surgeon: Anthony Claudis PENNER, MD;  Location: [facility];  Service: Endoscopy;  Laterality: N/A;  730-rescheduled 9/2 same time per Ann   CRANIOTOMY Left 04/29/2020   Procedure: LEFT CRANIECTOMY WITH TUMOR EXCISION;  Surgeon: Anthony Debby LABOR, MD;  Location: [place] OR;  Service: Neurosurgery;  Laterality: Left;   KNEE SURGERY Right    NASAL SEPTOPLASTY W/ TURBINOPLASTY Bilateral 03/19/2020   Procedure: NASAL SEPTOPLASTY WITH BILATERAL  TURBINATE REDUCTION;  Surgeon: Anthony Clunes, MD;  Location: [facility] OR;  Service: ENT;  Laterality: Bilateral;   VASECTOMY     Patient Active Problem List   Diagnosis Date Noted   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   Current use of long term anticoagulation 05/06/2022   Seizure (HCC) 08/13/2020   Seizures (HCC) 08/12/2020   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   DVT (deep venous thrombosis) (  HCC) 08/12/2020   COVID-19 virus infection 08/12/2020   Acute lower UTI 07/14/2020   Delirium 07/06/2020   Sleep apnea 07/06/2020   GERD (gastroesophageal reflux disease) 07/06/2020   Tetraplegia (HCC) 06/25/2020   Sundowning 06/25/2020   Slow transit constipation    Essential hypertension    Hypokalemia    Postoperative pain    Meningioma (HCC) 05/02/2020   Brain tumor (HCC) 04/28/2020   S/P nasal septoplasty 03/19/2020   Special screening for malignant neoplasms, colon 01/09/2018    ONSET DATE: 04/03/2023  REFERRING DIAG: G82.50 (ICD-10-CM) - Tetraplegia (HCC) D32.9 (ICD-10-CM) - Meningioma (HCC)  THERAPY DIAG:  Other abnormalities of gait and mobility  Muscle weakness (generalized)  Abnormal posture  Unsteadiness on feet  Other symptoms  and signs involving the musculoskeletal system  Other symptoms and signs involving the nervous system  Rationale for Evaluation and Treatment: Rehabilitation  SUBJECTIVE:                                                                                                                                                                                             SUBJECTIVE STATEMENT:  Pt denies any acute changes since last visit.   Pt accompanied by: self and family member Anthony Downs, family friend Anthony Downs  PERTINENT HISTORY: anxiety, OA, cancer, subdural hematoma   PAIN:  Are you having pain? No  PRECAUTIONS: Fall  RED FLAGS: None   WEIGHT BEARING RESTRICTIONS: No  FALLS: Has patient fallen in last 6 months? No  LIVING ENVIRONMENT: Lives with: lives with their spouse Lives in: House/apartment Home is power wheelchair accessible Has following equipment at home: Wheelchair (power), Ramped entry, and slide board, [name], handicap accessible bathroom, hospital bed  PLOF: Independent with household mobility with device, Needs assistance with ADLs, and Needs assistance with transfers  PATIENT GOALS: stand pivot transfer take steps in // bars  OBJECTIVE:  Note: Objective measures were completed at Evaluation unless otherwise noted.  DIAGNOSTIC FINDINGS: None relevant to this POC  COGNITION: Overall cognitive status: Impaired   SENSATION: No N/T per patient report  EDEMA:  Yes in BLE, wears compression stockings  POSTURE: rounded shoulders, forward head, and posterior pelvic tilt  LOWER EXTREMITY ROM:     Passive  Right Eval Left Eval  Hip flexion Tight hip flexors Tight hip flexors  Hip extension    Hip abduction    Hip adduction    Hip internal rotation    Hip external rotation    Knee flexion    Knee extension Tight HS Tight HS  Ankle dorsiflexion Tight gastroc Tight gastroc  Ankle plantarflexion    Ankle inversion  Ankle eversion     (Blank rows =  not tested)  LOWER EXTREMITY MMT:    MMT Right Eval Left Eval  Hip flexion 2- 3  Hip extension    Hip abduction    Hip adduction    Hip internal rotation    Hip external rotation    Knee flexion 2- 3  Knee extension 2- 3  Ankle dorsiflexion 0 1  Ankle plantarflexion    Ankle inversion    Ankle eversion    (Blank rows = not tested)  BED MOBILITY:  Sit to supine Mod A Supine to sit Min A Min A for trunk for supine to sit Mod A for BLE for sit to supine  TRANSFERS: Assistive device utilized: RW  Sit to stand: SBA and CGA Stand to sit: CGA Chair to chair: CGA  VITALS  There were no vitals filed for this visit.                                                                                                                              TREATMENT:   NMR/TherAct Sit to stand with SBA from PWC to bari RW during session Session focus on car transfer in patient's [facility] Stand pivot transfer PWC to front passenger seat with CGA Pt needs mod A x 2 to fully scoot back on seat in chair Reclined seat to allow him to lean to the R for better ability to get his LE into the vehicle Pt able to lift LLE up into vehicle, needs assist for RLE management 2nd therapist behind patient for trunk support and balance Pt also with difficulty clearing his head in/out of front seat due to slope of car roof Seat initially pulled all the way forwards to allow him to sit then scooted backwards to give him more room to bring his LE into the car Pt able to fully get into front seat of car but does need assist x 2 to do it safely and needs increased time, very fatigued after this transfer Utilized car cane hooked into door jamb for UE support Sit to stand with SBA from front seat of car, ambulation to back passenger side seat with RW and CGA Pt again needs mod A to attempt to scoot back on seat, however it was determined he would benefit from having a step under his feet While 2nd therapist  went to get an aerobic step pt verbalized that he did not feel comfortable sitting on edge of car seat, transferred back to his w/c with RW and CGA Stand pivot transfer PWC to back driver side seat with RW and CGA Utilized 4 aerobic step under BLE to allow pt to better scoot his hips back onto seat of car Reclined seat back so he can lean to the L to better bring his LE into the car However, due to RLE weakness and tightness he is unable to bring his RLE into the back seat  even with assist x 2 Determined that pt may be better off transferring in/out of passenger side of car whether it is the front seat or back seat; can also trial transfers into back seat of his transport van in future sessions    PATIENT EDUCATION: Education details: continue to work on LE stretches, walking at home, shower transfers, how we can trial car transfers in future sessions Person educated: Patient, Anthony Downs Education method: Explanation, Demonstration, Tactile cues, and Verbal cues Education comprehension: verbalized understanding, returned demonstration, verbal cues required, tactile cues required, and needs further education  HOME EXERCISE PROGRAM: To be initiated as appropriate  GOALS: Goals reviewed with patient? Yes   NEW SHORT TERM GOALS:   Target date: 08/23/2023  Pt will improve 5 x STS to less than or equal to 45s to demonstrate improved functional strength and transfer efficiency.  Baseline: 54.78s (7/8); 38.5s (8/5) Goal status: MET  2.   Pt will ambulate 61' w/RW and CGA x1 for improved independence and endurance  Baseline: 58 w/RW, CGA x1 and WC follow (7/8); 46' w/SBA and WC follow on 8/5 Goal status: IN PROGRESS   NEW LONG TERM GOALS:  Target date: 09/20/2023  Pt will ambulate 50' and full 180 degree turn wRW and CGA x1 for improved endurance and independence in home environment  Baseline: 50 ft x 4 with 180 degree turn with RW and CGA (9/2) Goal status: MET  2.  Pt will take shower using  shower bench and RW for improved endurance, independence w/ADLs and transfers.  Baseline: currently taking bird baths, has performed a dry run but still needs to take an actual shower (9/2)  Goal status: IN PROGRESS  NEW NEW SHORT TERM GOALS:   Target date: 10/18/2023   Pt will ambulate x 65 ft with RW and CGA for improved endurance. Baseline: 50 ft with 180 degree turn with RW and CGA (9/2); 103' w/RW and CGA (9/17), 105 ft with RW and CGA (10/1) Goal status: MET  2.  Pt will take shower using shower bench and RW for improved endurance, independence w/ADLs and transfers.  Baseline: currently taking bird baths, has performed a dry run but still needs to take an actual shower (9/2), performing shower transfers and bathing mostly independently (10/1)  Goal status: MET   NEW NEW LONG TERM GOALS:  Target date: 11/15/2023   Pt will ambulate x 150 ft with RW and CGA for improved endurance. Baseline: 50 ft with 180 degree turn with RW and CGA (9/2); 103' w/RW and CGA (9/17), 105 ft with RW and CGA (10/1) Goal status: REVISED  2.  Pt will improve 5 x STS to less than or equal to 30s to demonstrate improved functional strength and transfer efficiency.  Baseline: 54.78s (7/8); 38.5s (8/5) Goal status: INITIAL     ASSESSMENT:  CLINICAL IMPRESSION: Emphasis of skilled PT session on trialing car transfers on patient's [facility]. Pt is able to complete full transfer on passenger side front seat with assist x 2, however it does take increased time and pt is very fatigued by transfer. He is unable to complete a full transfer in the back seat on both passenger and driver's side of the car. Future sessions will focus on transfers on passenger side of the car and in patient's transport van. Continue POC.    OBJECTIVE IMPAIRMENTS: decreased activity tolerance, decreased balance, decreased cognition, decreased endurance, decreased knowledge of condition, decreased knowledge of use of DME,  decreased mobility, difficulty walking, decreased ROM,  decreased strength, increased edema, and impaired UE functional use.   ACTIVITY LIMITATIONS: carrying, lifting, bending, standing, transfers, and bed mobility  PARTICIPATION LIMITATIONS: meal prep, cleaning, laundry, and community activity  PERSONAL FACTORS: Fitness, Time since onset of injury/illness/exacerbation, and 1-2 comorbidities:   anxiety, OA, cancer, subdural hematoma are also affecting patient's functional outcome.   REHAB POTENTIAL: Good  CLINICAL DECISION MAKING: Stable/uncomplicated  EVALUATION COMPLEXITY: High  PLAN:  PT FREQUENCY: 1-2x/week  PT DURATION: 6 weeks + 8 weeks + 8 weeks (recert) + 8 weeks (scheduled for 4, can add 4 pending progress)  PLANNED INTERVENTIONS: 02835- PT Re-evaluation, 97110-Therapeutic exercises, 97530- Therapeutic activity, 97112- Neuromuscular re-education, 97535- Self Care, 02859- Manual therapy, [phone]- Gait training, [phone]- Orthotic Fit/training, [phone]- Electrical stimulation (manual), Patient/Family education, Balance training, Taping, Dry Needling, Joint mobilization, Cognitive remediation, DME instructions, Wheelchair mobility training, Cryotherapy, and Moist heat  PLAN FOR NEXT SESSION: how is stretching program w/[name]?. Work on Oncologist, retro gait, continue gait training w/RW, Sit to stands-from lower surfaces. EMOM training, independence w/transfers, step taps, lateral stepping at ballet bar again? 180 degree turns with RW, increasing endurance/gait distance, gait x 115 ft (full lap), Blaze pods at ballet bar, standing tasks with decreased UE support (ball toss, dowel rod volleyball), RLE strengthening (staggered stance?). Conservator, museum/gallery in [place]? Gait outdoors  Waddell Southgate, PT Waddell Southgate, PT, DPT, CSRS   11/03/2023, 4:21 PM

## 2023-11-07 ENCOUNTER — Encounter (HOSPITAL_COMMUNITY): Payer: Self-pay | Admitting: Internal Medicine

## 2023-11-07 ENCOUNTER — Other Ambulatory Visit: Payer: Self-pay

## 2023-11-07 ENCOUNTER — Observation Stay (HOSPITAL_COMMUNITY)
Admission: EM | Admit: 2023-11-07 | Discharge: 2023-11-08 | Disposition: A | Discharge status: Home / Self Care | Attending: Internal Medicine | Admitting: Internal Medicine

## 2023-11-07 ENCOUNTER — Emergency Department (HOSPITAL_COMMUNITY)

## 2023-11-07 DIAGNOSIS — M25531 Pain in right wrist: Secondary | ICD-10-CM | POA: Diagnosis not present

## 2023-11-07 DIAGNOSIS — G9389 Other specified disorders of brain: Secondary | ICD-10-CM

## 2023-11-07 DIAGNOSIS — N400 Enlarged prostate without lower urinary tract symptoms: Secondary | ICD-10-CM | POA: Diagnosis not present

## 2023-11-07 DIAGNOSIS — Z85828 Personal history of other malignant neoplasm of skin: Secondary | ICD-10-CM | POA: Insufficient documentation

## 2023-11-07 DIAGNOSIS — K219 Gastro-esophageal reflux disease without esophagitis: Secondary | ICD-10-CM | POA: Diagnosis not present

## 2023-11-07 DIAGNOSIS — E1165 Type 2 diabetes mellitus with hyperglycemia: Secondary | ICD-10-CM | POA: Insufficient documentation

## 2023-11-07 DIAGNOSIS — E785 Hyperlipidemia, unspecified: Secondary | ICD-10-CM | POA: Insufficient documentation

## 2023-11-07 DIAGNOSIS — R569 Unspecified convulsions: Principal | ICD-10-CM

## 2023-11-07 DIAGNOSIS — Z79899 Other long term (current) drug therapy: Secondary | ICD-10-CM | POA: Diagnosis not present

## 2023-11-07 DIAGNOSIS — G40909 Epilepsy, unspecified, not intractable, without status epilepticus: Secondary | ICD-10-CM | POA: Diagnosis present

## 2023-11-07 DIAGNOSIS — Z96651 Presence of right artificial knee joint: Secondary | ICD-10-CM | POA: Insufficient documentation

## 2023-11-07 DIAGNOSIS — J45909 Unspecified asthma, uncomplicated: Secondary | ICD-10-CM | POA: Insufficient documentation

## 2023-11-07 DIAGNOSIS — G4733 Obstructive sleep apnea (adult) (pediatric): Secondary | ICD-10-CM | POA: Insufficient documentation

## 2023-11-07 DIAGNOSIS — D329 Benign neoplasm of meninges, unspecified: Secondary | ICD-10-CM | POA: Diagnosis present

## 2023-11-07 DIAGNOSIS — Z9889 Other specified postprocedural states: Secondary | ICD-10-CM

## 2023-11-07 DIAGNOSIS — Z86011 Personal history of benign neoplasm of the brain: Secondary | ICD-10-CM | POA: Diagnosis not present

## 2023-11-07 DIAGNOSIS — Z7982 Long term (current) use of aspirin: Secondary | ICD-10-CM | POA: Diagnosis not present

## 2023-11-07 DIAGNOSIS — R609 Edema, unspecified: Secondary | ICD-10-CM | POA: Insufficient documentation

## 2023-11-07 DIAGNOSIS — I10 Essential (primary) hypertension: Secondary | ICD-10-CM | POA: Diagnosis present

## 2023-11-07 DIAGNOSIS — Z86718 Personal history of other venous thrombosis and embolism: Secondary | ICD-10-CM | POA: Diagnosis not present

## 2023-11-07 LAB — I-STAT CHEM 8, ED
BUN: 12 mg/dL (ref 8–23)
Calcium, Ion: 1.11 mmol/L — ABNORMAL LOW (ref 1.15–1.40)
Chloride: 104 mmol/L (ref 98–111)
Creatinine, Ser: 1.1 mg/dL (ref 0.61–1.24)
Glucose, Bld: 85 mg/dL (ref 70–99)
HCT: 45 % (ref 39.0–52.0)
Hemoglobin: 15.3 g/dL (ref 13.0–17.0)
Potassium: 4.6 mmol/L (ref 3.5–5.1)
Sodium: 141 mmol/L (ref 135–145)
TCO2: 25 mmol/L (ref 22–32)

## 2023-11-07 LAB — COMPREHENSIVE METABOLIC PANEL WITH GFR
ALT: 26 U/L (ref 0–44)
AST: 20 U/L (ref 15–41)
Albumin: 3.3 g/dL — ABNORMAL LOW (ref 3.5–5.0)
Alkaline Phosphatase: 110 U/L (ref 38–126)
Anion gap: 10 (ref 5–15)
BUN: 10 mg/dL (ref 8–23)
CO2: 24 mmol/L (ref 22–32)
Calcium: 8.9 mg/dL (ref 8.9–10.3)
Chloride: 105 mmol/L (ref 98–111)
Creatinine, Ser: 1.14 mg/dL (ref 0.61–1.24)
GFR, Estimated: >60 mL/min (ref >60)
Glucose, Bld: 88 mg/dL (ref 70–99)
Potassium: 4.6 mmol/L (ref 3.5–5.1)
Sodium: 139 mmol/L (ref 135–145)
Total Bilirubin: 0.5 mg/dL (ref 0.0–1.2)
Total Protein: 7 g/dL (ref 6.5–8.1)

## 2023-11-07 LAB — CBC
HCT: 45.4 % (ref 39.0–52.0)
Hemoglobin: 14.4 g/dL (ref 13.0–17.0)
MCH: 29.9 pg (ref 26.0–34.0)
MCHC: 31.7 g/dL (ref 30.0–36.0)
MCV: 94.4 fL (ref 80.0–100.0)
Platelets: 187 K/uL (ref 150–400)
RBC: 4.81 MIL/uL (ref 4.22–5.81)
RDW: 13.7 % (ref 11.5–15.5)
WBC: 9.3 K/uL (ref 4.0–10.5)
nRBC: 0 % (ref 0.0–0.2)

## 2023-11-07 LAB — DIFFERENTIAL
Abs Immature Granulocytes: 0.04 K/uL (ref 0.00–0.07)
Basophils Absolute: 0 K/uL (ref 0.0–0.1)
Basophils Relative: 0 %
Eosinophils Absolute: 0.3 K/uL (ref 0.0–0.5)
Eosinophils Relative: 3 %
Immature Granulocytes: 0 %
Lymphocytes Relative: 37 %
Lymphs Abs: 3.4 K/uL (ref 0.7–4.0)
Monocytes Absolute: 0.9 K/uL (ref 0.1–1.0)
Monocytes Relative: 9 %
Neutro Abs: 4.6 K/uL (ref 1.7–7.7)
Neutrophils Relative %: 51 %

## 2023-11-07 LAB — ETHANOL: Alcohol, Ethyl (B): <15 mg/dL (ref <15)

## 2023-11-07 LAB — PROTIME-INR
INR: 1 (ref 0.8–1.2)
Prothrombin Time: 14.1 s (ref 11.4–15.2)

## 2023-11-07 LAB — APTT: aPTT: 22 s — ABNORMAL LOW (ref 24–36)

## 2023-11-07 LAB — CBG MONITORING, ED: Glucose-Capillary: 79 mg/dL (ref 70–99)

## 2023-11-07 MED ORDER — ACETAMINOPHEN 325 MG PO TABS
650.0000 mg | ORAL_TABLET | Freq: Four times a day (QID) | ORAL | Status: DC | PRN
  Administered 2023-11-08: 650 mg via ORAL
  Filled 2023-11-07: qty 2

## 2023-11-07 MED ORDER — FUROSEMIDE 20 MG PO TABS
20.0000 mg | ORAL_TABLET | Freq: Two times a day (BID) | ORAL | Status: DC
  Administered 2023-11-08: 20 mg via ORAL
  Filled 2023-11-07: qty 1

## 2023-11-07 MED ORDER — ENOXAPARIN SODIUM 40 MG/0.4ML IJ SOSY
40.0000 mg | PREFILLED_SYRINGE | INTRAMUSCULAR | Status: DC
  Administered 2023-11-08: 40 mg via SUBCUTANEOUS
  Filled 2023-11-07: qty 0.4

## 2023-11-07 MED ORDER — PROPRANOLOL HCL 10 MG PO TABS
20.0000 mg | ORAL_TABLET | Freq: Two times a day (BID) | ORAL | Status: DC
  Administered 2023-11-07 – 2023-11-08 (×2): 20 mg via ORAL
  Filled 2023-11-07 (×2): qty 2

## 2023-11-07 MED ORDER — TAMSULOSIN HCL 0.4 MG PO CAPS: 0.4000 mg | ORAL_CAPSULE | Freq: Every day | ORAL | Status: DC

## 2023-11-07 MED ORDER — SODIUM CHLORIDE 0.9% FLUSH: 3.0000 mL | Freq: Once | INTRAVENOUS | Status: DC

## 2023-11-07 MED ORDER — FINASTERIDE 5 MG PO TABS
5.0000 mg | ORAL_TABLET | Freq: Every day | ORAL | Status: DC
  Administered 2023-11-08: 5 mg via ORAL
  Filled 2023-11-07: qty 1

## 2023-11-07 MED ORDER — SODIUM CHLORIDE 0.9 % IV SOLN
200.0000 mg | Freq: Once | INTRAVENOUS | Status: AC
  Administered 2023-11-07: 200 mg via INTRAVENOUS
  Filled 2023-11-07: qty 20

## 2023-11-07 MED ORDER — ACETAMINOPHEN 650 MG RE SUPP: 650.0000 mg | Freq: Four times a day (QID) | RECTAL | Status: DC | PRN

## 2023-11-07 MED ORDER — INSULIN ASPART 100 UNIT/ML IJ SOLN
0.0000 [IU] | Freq: Three times a day (TID) | INTRAMUSCULAR | Status: DC
  Administered 2023-11-08 (×2): 1 [IU] via SUBCUTANEOUS

## 2023-11-07 MED ORDER — ATORVASTATIN CALCIUM 10 MG PO TABS
10.0000 mg | ORAL_TABLET | Freq: Every day | ORAL | Status: DC
  Administered 2023-11-07: 10 mg via ORAL
  Filled 2023-11-07: qty 1

## 2023-11-07 MED ORDER — ACETAMINOPHEN 325 MG PO TABS
650.0000 mg | ORAL_TABLET | Freq: Once | ORAL | Status: AC
Start: 2023-11-08 — End: 2023-11-08
  Administered 2023-11-08: 650 mg via ORAL
  Filled 2023-11-07: qty 2

## 2023-11-07 MED ORDER — LACOSAMIDE 50 MG PO TABS
50.0000 mg | ORAL_TABLET | Freq: Two times a day (BID) | ORAL | Status: DC
  Administered 2023-11-08: 50 mg via ORAL
  Filled 2023-11-07: qty 1

## 2023-11-07 MED ORDER — PANTOPRAZOLE SODIUM 40 MG PO TBEC
40.0000 mg | DELAYED_RELEASE_TABLET | Freq: Every day | ORAL | Status: DC
  Administered 2023-11-08: 40 mg via ORAL
  Filled 2023-11-07: qty 1

## 2023-11-07 MED ORDER — VITAMIN B-12 1000 MCG PO TABS
1000.0000 ug | ORAL_TABLET | Freq: Every day | ORAL | Status: DC
  Administered 2023-11-08: 1000 ug via ORAL
  Filled 2023-11-07: qty 1

## 2023-11-07 NOTE — Consult Note (Signed)
 NEUROLOGY CONSULT NOTE   Date of service: November 07, 2023 Patient Name: Anthony Downs MRN:  [id number] DOB:  10-May-1950 Chief Complaint: Left gaze, aphasia Requesting Provider: Doretha Folks, MD  History of Present Illness  ALISHA BURGO is a 73 y.o. male with hx of hypertension, sleep apnea, meningioma status post bifrontal craniectomy April 2022, history of seizure, not on seizure medications for right now presented for evaluation of episode of unresponsiveness followed by coming around with leftward gaze and aphasia that resolved en route. Last known well 6:30 PM. Noted by family to be unresponsive.  On EMS arrival, would open eyes, had leftward gaze and was aphasic with right hemiplegia. Symptoms rapidly improved on the way without any intervention. No seizure noted. Family reports that antiseizure medications were stopped a little while ago because he has not had seizures in a while and the medications made him more drowsy   LKW: 1830 hrs. Modified rankin score: 3-Moderate disability-requires help but walks WITHOUT assistance IV Thrombolysis: Likely a seizure, examination back to baseline, too mild to treat EVT: Exam not consistent with LVO  NIHSS components Score: Comment  1a Level of Conscious 0[x]  1[]  2[]  3[]      1b LOC Questions 0[x]  1[]  2[]       1c LOC Commands 0[x]  1[]  2[]       2 Best Gaze 0[x]  1[]  2[]       3 Visual 0[x]  1[]  2[]  3[]      4 Facial Palsy 0[]  1[x]  2[]  3[]      5a Motor Arm - left 0[x]  1[]  2[]  3[]  4[]  UN[]    5b Motor Arm - Right 0[x]  1[]  2[]  3[]  4[]  UN[]    6a Motor Leg - Left 0[x]  1[]  2[]  3[]  4[]  UN[]    6b Motor Leg - Right 0[]  1[x]  2[]  3[]  4[]  UN[]    7 Limb Ataxia 0[x]  1[]  2[]  UN[]      8 Sensory 0[x]  1[]  2[]  UN[]      9 Best Language 0[x]  1[]  2[]  3[]      10 Dysarthria 0[x]  1[]  2[]  UN[]      11 Extinct. and Inattention 0[x]  1[]  2[]       TOTAL: 2      ROS  Comprehensive ROS performed and pertinent positives documented in HPI   Past  History   Past Medical History:  Diagnosis Date   Acid reflux    Anxiety    Arthritis    Asthma    as a child   Basal cell carcinoma 01/26/2021   RIGHT POST SURICULAR INFERIOR   Basal cell carcinoma 01/26/2021   RIGHT POST AURICULAR SUPERIOR   Benign brain tumor (HCC)    Chest pain    a. 09/2019 MV: No ischemia.   Complication of anesthesia    slow to wake up and pain medicines make him sick   GERD (gastroesophageal reflux disease)    History of DVT (deep vein thrombosis)    History of echocardiogram    a. 09/2019 Echo: EF 60-65%, nl RV fxn; b. 09/2022 Echo: EF of 60-65% without regional wall motion abnormalities, normal RV size/function, and borderline dilatation of the aortic root at 39 mm.   Hypercholesteremia    Hypertension    PONV (postoperative nausea and vomiting)    PVC's (premature ventricular contractions)    a. 08/2022 Zio: predominantly sinus rhythm with an average heart rate of 71 (50-150), 5 brief SVT runs (longest 10.2 seconds with max rate of 150).  3.2% PVC burden was also noted.  No triggered events.  Skin cancer    skin cancer - basal cell on head, squamous behind ear   Sleep apnea    no Cpap   Subdural hematoma, post-traumatic (HCC) 2008   fell off truck hit head on concrete    Past Surgical History:  Procedure Laterality Date   APPLICATION OF CRANIAL NAVIGATION Left 04/29/2020   Procedure: APPLICATION OF CRANIAL NAVIGATION;  Surgeon: Cheryle Debby LABOR, MD;  Location: [facility] OR;  Service: Neurosurgery;  Laterality: Left;   arthroscopic knee     COLONOSCOPY N/A 11/29/2018   Procedure: COLONOSCOPY;  Surgeon: Golda Claudis PENNER, MD;  Location: [facility];  Service: Endoscopy;  Laterality: N/A;  730-rescheduled 9/2 same time per Ann   CRANIOTOMY Left 04/29/2020   Procedure: LEFT CRANIECTOMY WITH TUMOR EXCISION;  Surgeon: Cheryle Debby LABOR, MD;  Location: [facility] OR;  Service: Neurosurgery;  Laterality: Left;   KNEE SURGERY Right    NASAL SEPTOPLASTY W/  TURBINOPLASTY Bilateral 03/19/2020   Procedure: NASAL SEPTOPLASTY WITH BILATERAL  TURBINATE REDUCTION;  Surgeon: Karis Clunes, MD;  Location: [facility] OR;  Service: ENT;  Laterality: Bilateral;   VASECTOMY      Family History: Family History  Problem Relation Age of Onset   Breast cancer Mother    Pancreatic cancer Mother    Aortic aneurysm Father     Social History  reports that he has never smoked. He has never used smokeless tobacco. He reports that he does not drink alcohol  and does not use drugs.  Allergies  Allergen Reactions   Sinemet  [Carbidopa -Levodopa ]     sedation   Ativan  [Lorazepam ]     Worsening confusion   Haldol  [Haloperidol ]     Worsening confusion    Medications   Current Facility-Administered Medications:    lacosamide (VIMPAT) 200 mg in sodium chloride  0.9 % 25 mL IVPB, 200 mg, Intravenous, Once, Voncile Isles, MD, Last Rate: 90 mL/hr at 11/07/23 2023, 200 mg at 11/07/23 2023   [START ON 11/08/2023] lacosamide (VIMPAT) tablet 50 mg, 50 mg, Oral, BID, Guenevere Roorda, MD   sodium chloride  flush (NS) 0.9 % injection 3 mL, 3 mL, Intravenous, Once, Doretha Folks, MD  Current Outpatient Medications:    ACCU-CHEK GUIDE TEST test strip, daily., Disp: , Rfl:    albuterol  (PROVENTIL ) 2 MG tablet, Take 1 tablet (2 mg total) by mouth 3 (three) times daily., Disp: 42 tablet, Rfl: 0   albuterol  (VENTOLIN  HFA) 108 (90 Base) MCG/ACT inhaler, Inhale 2 puffs into the lungs every 4 (four) hours., Disp: , Rfl:    aspirin  EC 81 MG tablet, Take 81 mg by mouth daily. Swallow whole., Disp: , Rfl:    atorvastatin  (LIPITOR) 10 MG tablet, Take 1 tablet (10 mg total) by mouth at bedtime., Disp: 90 tablet, Rfl: 3   b complex vitamins capsule, Take 1 capsule by mouth daily., Disp: , Rfl:    carbidopa -levodopa  (SINEMET ) 10-100 MG tablet, Take 1 tablet by mouth 3 (three) times daily. Take 1 tablet twice daily for one week, then increase to 3 times daily., Disp: 90 tablet, Rfl: 3   cetirizine  (ZYRTEC) 10 MG tablet, Take 10 mg by mouth daily., Disp: , Rfl:    cyanocobalamin  (VITAMIN B12) 1000 MCG tablet, Take 1,000 mcg by mouth daily., Disp: , Rfl:    empagliflozin  (JARDIANCE ) 10 MG TABS tablet, Take 10 mg by mouth daily., Disp: , Rfl:    empagliflozin  (JARDIANCE ) 25 MG TABS tablet, Take 1 tablet (25 mg total) by mouth daily., Disp: 30 tablet, Rfl:  11   finasteride  (PROSCAR ) 5 MG tablet, Take 1 tablet (5 mg total) by mouth daily., Disp: 90 tablet, Rfl: 3   finasteride  (PROSCAR ) 5 MG tablet, Take 1 tablet (5 mg total) by mouth daily., Disp: 90 tablet, Rfl: 3   furosemide  (LASIX ) 20 MG tablet, Take 1 tablet (20 mg total) by mouth 2 (two) times daily., Disp: 60 tablet, Rfl: 11   glimepiride  (AMARYL ) 2 MG tablet, Take 2 mg by mouth in the morning and at bedtime., Disp: , Rfl:    glimepiride  (AMARYL ) 4 MG tablet, Take 1 tablet (4 mg total) by mouth daily., Disp: 90 tablet, Rfl: 3   glimepiride  (AMARYL ) 4 MG tablet, Take 2 tablets (8 mg total) by mouth daily., Disp: 60 tablet, Rfl: 11   glucose blood test strip, Use to test blood sugar daily., Disp: 100 each, Rfl: 3   magnesium  oxide (MAG-OX) 400 (240 Mg) MG tablet, Take 1 tablet (400 mg total) by mouth daily., Disp: 90 tablet, Rfl: 3   pantoprazole  (PROTONIX ) 40 MG tablet, Take 1 tablet (40 mg total) by mouth daily., Disp: 30 tablet, Rfl: 0   potassium chloride  SA (KLOR-CON  M) 20 MEQ tablet, Take 1 tablet (20 mEq total) by mouth 2 (two) times daily., Disp: 60 tablet, Rfl: 11   Primidone  125 MG TABS, Take 1 tablet (125 mg) by mouth at bedtime., Disp: 30 tablet, Rfl: 4   propranolol  (INDERAL ) 20 MG tablet, Take 1 tablet (20 mg total) by mouth 3 (three) times daily., Disp: , Rfl:    propranolol  (INDERAL ) 20 MG tablet, Take 2 tablets (40 mg total) by mouth daily., Disp: 180 tablet, Rfl: 3   Propylene Glycol-Glycerin (SOOTHE OP), Apply 1 drop to eye daily as needed (dry eyes)., Disp: , Rfl:    silver sulfADIAZINE (SILVADENE) 1 % cream, Apply 1  Application topically 2 (two) times daily. (Patient taking differently: Apply 1 Application topically daily.), Disp: , Rfl:    tamsulosin  (FLOMAX ) 0.4 MG CAPS capsule, Take 1 capsule (0.4 mg total) by mouth in the morning and at bedtime., Disp: 180 capsule, Rfl: 3   tamsulosin  (FLOMAX ) 0.4 MG CAPS capsule, Take 1 capsule (0.4 mg total) by mouth daily after supper., Disp: 90 capsule, Rfl: 3   tamsulosin  (FLOMAX ) 0.4 MG CAPS capsule, Take 1 capsule (0.4 mg total) by mouth daily after supper., Disp: 90 capsule, Rfl: 3   Vitamin D, Ergocalciferol, (DRISDOL) 1.25 MG (50000 UNIT) CAPS capsule, Take 50,000 Units by mouth once a week., Disp: , Rfl:    zinc  gluconate 50 MG tablet, Take 50 mg by mouth daily., Disp: , Rfl:   Vitals   Vitals:   11/07/23 1900 11/07/23 2000 11/07/23 2008 11/07/23 2009  BP:  (!) 141/59 (!) 141/59   Pulse:  74 72   Resp:  19 20   Temp:   (!) 97.5 F (36.4 C)   TempSrc:   Oral   SpO2:  96% 95%   Weight: (!) 154.4 kg   (!) 154.4 kg  Height:    6' 1 (1.854 m)    Body mass index is 44.91 kg/m.   Physical Exam   Constitutional: Appears well-developed and well-nourished.  Psych: Affect appropriate to situation.  Eyes: No scleral injection.  HENT: No OP obstruction.  Head: Normocephalic.  Cardiovascular: Normal rate and regular rhythm.  Respiratory: Effort normal, non-labored breathing.  GI: Soft.  No distension. There is no tenderness.  Skin: WDI.   Neurologic Examination  Appears awake alert and oriented  x 3.  No dysarthria.  No evidence of aphasia. Cranial nerves II to XII with mild right subtle right nasolabial fold flattening.  Otherwise unremarkable Motor examination with mild drift in the right lower extremity.  Right upper extremity somewhat weak but does not have a vertical drift.  That is baseline from his prior exam.  Left side is full strength Sensation intact light touch without extinction Coordination examination reveals no gross  dysmetria  Labs/Imaging/Neurodiagnostic studies   CBC:  Recent Labs  Lab 11/16/23 1945 16-Nov-2023 1948  WBC  --  9.3  NEUTROABS  --  4.6  HGB 15.3 14.4  HCT 45.0 45.4  MCV  --  94.4  PLT  --  187   Basic Metabolic Panel:  Lab Results  Component Value Date   NA 139 2023-11-16   K 4.6 2023/11/16   CO2 24 11-16-23   GLUCOSE 88 2023-11-16   BUN 10 11-16-2023   CREATININE 1.14 11-16-23   CALCIUM  8.9 11-16-2023   GFRNONAA >60 2023-11-16   GFRAA 70 (L) 04/04/2011   HgbA1c:  Lab Results  Component Value Date   HGBA1C 6.0 (H) 07/07/2020   Urine Drug Screen:     Component Value Date/Time   LABOPIA NONE DETECTED 07/06/2020 1942   COCAINSCRNUR NONE DETECTED 07/06/2020 1942   LABBENZ NONE DETECTED 07/06/2020 1942   AMPHETMU NONE DETECTED 07/06/2020 1942   THCU NONE DETECTED 07/06/2020 1942   LABBARB NONE DETECTED 07/06/2020 1942    Alcohol  Level     Component Value Date/Time   ETH <15 Nov 16, 2023 1948   INR  Lab Results  Component Value Date   INR 1.0 11/16/23   APTT  Lab Results  Component Value Date   APTT 22 (L) November 16, 2023   Imaging personally reviewed CT head without contrast, no acute changes  ASSESSMENT   BALIN VANDEGRIFT is a 73 y.o. male prior history of hypertension obstructive sleep apnea meningioma status post bifrontal craniectomy history of seizures not on any antiepileptics at this time because of no seizures for a long time, presents for evaluation of episode of unresponsiveness followed by leftward gaze and right-sided weakness that has been resolving.  Concern for seizure followed by PhosChol Todd's paralysis. CT imaging without any evidence of bleed or acute process.  Impression: Likely breakthrough seizure in the setting of not being on antiepileptics, emanating from encephalomalacia from prior meningioma resection  RECOMMENDATIONS  Admit to hospitalist for observation Frequent neurochecks Telemetry Seizure precautions Load with  Vimpat 200 mg IV x 1.  Start Vimpat 50 twice daily from tomorrow morning. Swallow screen-if passes, can get a diet. Routine EEG in the morning MRI brain with and without contrast Neurology will follow the above imaging and EEG results with you, and we will see the patient if he has further seizure activity or if any of the diagnostic studies are abnormal. Plan was discussed with Dr. Doretha, patient and his wife at bedside.  ______________________________________________________________________    Bonney Eligio Lav, MD [facility]   CRITICAL CARE ATTESTATION Performed by: Eligio Lav, MD Total critical care time: 45 minutes Critical care time was exclusive of separately billable procedures and treating other patients and/or supervising APPs/Residents/Students Critical care was necessary to treat or prevent imminent or life-threatening deterioration. This patient is critically ill and at significant risk for neurological worsening and/or death and care requires constant monitoring. Critical care was time spent personally by me on the following activities: development of treatment plan with patient and/or surrogate as well  as nursing, discussions with consultants, evaluation of patient's response to treatment, examination of patient, obtaining history from patient or surrogate, ordering and performing treatments and interventions, ordering and review of laboratory studies, ordering and review of radiographic studies, pulse oximetry, re-evaluation of patient's condition, participation in multidisciplinary rounds and medical decision making of high complexity in the care of this patient.

## 2023-11-07 NOTE — H&P (Incomplete)
 History and Physical    Anthony Downs FMW:[id number] DOB: [birth year] DOA: 11/07/2023  Patient coming from: Home.  Chief Complaint: Seizures.  HPI: Anthony Downs is a 73 y.o. male with history of meningioma status post bifrontal craniectomy in April 2022 with history of seizures presently not on seizure medication which was discontinued in February 2025, hypertension recently diagnosed diabetes mellitus type 2, hyperlipidemia, BPH was brought to the ER after patient had an unresponsive episode following which patient had tonic clonic jerks.  As per patient's wife who provided the history this evening patient's wife was about to go out to shopping when she heard a sound and went to check on her husband he was lying on his side on the bed unresponsive and started getting tonic clonic jerks and EMS was called.  EMS on arrival found that patient had left-sided gaze and aphasic.  Patient has chronic right-sided weakness from previous surgery.  At the time of my exam patient is complaining of right wrist pain after the incident.  ED Course: On arrival patient was seen by neurology.  CT head was unremarkable.  Patient symptoms were concerning for seizures and was loaded with Vimpat.  At the time of my exam patient is back to baseline.  Plan is to admit patient for observation get MRI brain and EEG.  Patient does have some pain on his right wrist.  Labs are largely unremarkable.  Review of Systems: As per HPI, rest all negative.   Past Medical History:  Diagnosis Date   Acid reflux    Anxiety    Arthritis    Asthma    as a child   Basal cell carcinoma 01/26/2021   RIGHT POST SURICULAR INFERIOR   Basal cell carcinoma 01/26/2021   RIGHT POST AURICULAR SUPERIOR   Benign brain tumor (HCC)    Chest pain    a. 09/2019 MV: No ischemia.   Complication of anesthesia    slow to wake up and pain medicines make him sick   GERD (gastroesophageal reflux disease)    History of DVT (deep vein  thrombosis)    History of echocardiogram    a. 09/2019 Echo: EF 60-65%, nl RV fxn; b. 09/2022 Echo: EF of 60-65% without regional wall motion abnormalities, normal RV size/function, and borderline dilatation of the aortic root at 39 mm.   Hypercholesteremia    Hypertension    PONV (postoperative nausea and vomiting)    PVC's (premature ventricular contractions)    a. 08/2022 [facility]io: predominantly sinus rhythm with an average heart rate of 71 (50-150), 5 brief SVT runs (longest 10.2 seconds with max rate of 150).  3.2% PVC burden was also noted.  No triggered events.   Skin cancer    skin cancer - basal cell on head, squamous behind ear   Sleep apnea    no Cpap   Subdural hematoma, post-traumatic (HCC) 2008   fell off truck hit head on concrete    Past Surgical History:  Procedure Laterality Date   APPLICATION OF CRANIAL NAVIGATION Left 04/29/2020   Procedure: APPLICATION OF CRANIAL NAVIGATION;  Surgeon: Cheryle Debby LABOR, MD;  Location: [facility] OR;  Service: Neurosurgery;  Laterality: Left;   arthroscopic knee     COLONOSCOPY N/A 11/29/2018   Procedure: COLONOSCOPY;  Surgeon: Golda Claudis PENNER, MD;  Location: [facility];  Service: Endoscopy;  Laterality: N/A;  730-rescheduled 9/2 same time per Ann   CRANIOTOMY Left 04/29/2020   Procedure: LEFT CRANIECTOMY WITH TUMOR EXCISION;  Surgeon:  Cheryle Debby LABOR, MD;  Location: [facility] OR;  Service: Neurosurgery;  Laterality: Left;   KNEE SURGERY Right    NASAL SEPTOPLASTY W/ TURBINOPLASTY Bilateral 03/19/2020   Procedure: NASAL SEPTOPLASTY WITH BILATERAL  TURBINATE REDUCTION;  Surgeon: Karis Clunes, MD;  Location: [facility] OR;  Service: ENT;  Laterality: Bilateral;   VASECTOMY       reports that he has never smoked. He has never used smokeless tobacco. He reports that he does not drink alcohol  and does not use drugs.  Allergies  Allergen Reactions   Sinemet  [Carbidopa -Levodopa ]     sedation   Ativan  [Lorazepam ]     Worsening confusion   Haldol   [Haloperidol ]     Worsening confusion    Family History  Problem Relation Age of Onset   Breast cancer Mother    Pancreatic cancer Mother    Aortic aneurysm Father     Prior to Admission medications   Medication Sig Start Date End Date Taking? Authorizing Provider  ACCU-CHEK GUIDE TEST test strip daily. 02/03/23   [provider]  albuterol  (PROVENTIL ) 2 MG tablet Take 1 tablet (2 mg total) by mouth 3 (three) times daily. 06/03/23   Bertell Satterfield, MD  albuterol  (VENTOLIN  HFA) [phone] Base) MCG/ACT inhaler Inhale 2 puffs into the lungs every 4 (four) hours. 11/12/21   [provider]  aspirin  EC 81 MG tablet Take 81 mg by mouth daily. Swallow whole.    [provider]  atorvastatin  (LIPITOR) 10 MG tablet Take 1 tablet (10 mg total) by mouth at bedtime. 08/15/23   Alvan Dorn FALCON, MD  b complex vitamins capsule Take 1 capsule by mouth daily.    [provider]  carbidopa -levodopa  (SINEMET ) 10-100 MG tablet Take 1 tablet by mouth 3 (three) times daily. Take 1 tablet twice daily for one week, then increase to 3 times daily. 05/04/23   Babs Arthea DASEN, MD  cetirizine (ZYRTEC) 10 MG tablet Take 10 mg by mouth daily.    [provider]  cyanocobalamin  (VITAMIN B12) 1000 MCG tablet Take 1,000 mcg by mouth daily.    [provider]  empagliflozin  (JARDIANCE ) 10 MG TABS tablet Take 10 mg by mouth daily.    [provider]  empagliflozin  (JARDIANCE ) 25 MG TABS tablet Take 1 tablet (25 mg total) by mouth daily. 02/22/23     finasteride  (PROSCAR ) 5 MG tablet Take 1 tablet (5 mg total) by mouth daily. 04/19/23   Nieves Cough, MD  finasteride  (PROSCAR ) 5 MG tablet Take 1 tablet (5 mg total) by mouth daily. 02/07/23   Nieves Cough, MD  furosemide  (LASIX ) 20 MG tablet Take 1 tablet (20 mg total) by mouth 2 (two) times daily. 10/05/23   Alvan Dorn FALCON, MD  glimepiride  (AMARYL ) 2 MG tablet Take 2 mg by mouth in the morning and at  bedtime.    [provider]  glimepiride  (AMARYL ) 4 MG tablet Take 1 tablet (4 mg total) by mouth daily. 06/28/23   Bertell Satterfield, MD  glimepiride  (AMARYL ) 4 MG tablet Take 2 tablets (8 mg total) by mouth daily. 01/07/23   Bertell Satterfield, MD  glucose blood test strip Use to test blood sugar daily. 02/02/23     magnesium  oxide (MAG-OX) 400 (240 Mg) MG tablet Take 1 tablet (400 mg total) by mouth daily. 09/14/22   Dunn, Dayna N, PA-C  pantoprazole  (PROTONIX ) 40 MG tablet Take 1 tablet (40 mg total) by mouth daily. 06/17/20   Maurice Sharlet RAMAN, PA-C  potassium  chloride SA (KLOR-CON  M) 20 MEQ tablet Take 1 tablet (20 mEq total) by mouth 2 (two) times daily. 10/05/23   Alvan Dorn FALCON, MD  Primidone  125 MG TABS Take 1 tablet (125 mg) by mouth at bedtime. 08/31/23   Babs Arthea DASEN, MD  propranolol  (INDERAL ) 20 MG tablet Take 1 tablet (20 mg total) by mouth 3 (three) times daily. 04/20/23   Babs Arthea DASEN, MD  propranolol  (INDERAL ) 20 MG tablet Take 2 tablets (40 mg total) by mouth daily. 03/21/23     Propylene Glycol-Glycerin (SOOTHE OP) Apply 1 drop to eye daily as needed (dry eyes).    [provider]  silver sulfADIAZINE (SILVADENE) 1 % cream Apply 1 Application topically 2 (two) times daily. Patient taking differently: Apply 1 Application topically daily. 02/03/23   [provider]  tamsulosin  (FLOMAX ) 0.4 MG CAPS capsule Take 1 capsule (0.4 mg total) by mouth in the morning and at bedtime. 06/14/23   Nieves Cough, MD  tamsulosin  (FLOMAX ) 0.4 MG CAPS capsule Take 1 capsule (0.4 mg total) by mouth daily after supper. 02/07/23   Nieves Cough, MD  tamsulosin  (FLOMAX ) 0.4 MG CAPS capsule Take 1 capsule (0.4 mg total) by mouth daily after supper. 04/19/23   Nieves Cough, MD  Vitamin D, Ergocalciferol, (DRISDOL) 1.25 MG (50000 UNIT) CAPS capsule Take 50,000 Units by mouth once a week. 08/05/22   [provider]  zinc  gluconate 50 MG tablet Take 50 mg by mouth  daily.    [provider]    Physical Exam: Constitutional: Moderately built and nourished. Vitals:   11/07/23 1900 11/07/23 2000 11/07/23 2008 11/07/23 2009  BP:  (!) 141/59 (!) 141/59   Pulse:  74 72   Resp:  19 20   Temp:   (!) 97.5 F (36.4 C)   TempSrc:   Oral   SpO2:  96% 95%   Weight: (!) 154.4 kg   (!) 154.4 kg  Height:    6' 1 (1.854 m)   Eyes: Anicteric no pallor. ENMT: No discharge from the ears eyes nose or mouth. Neck: No mass felt.  No neck rigidity. Respiratory: No rhonchi or crepitations. Cardiovascular: S1-S2 heard. Abdomen: Soft nontender bowel sound present. Musculoskeletal: Pain of the right wrist. Skin: No rash. Neurologic: Alert awake oriented time place and person.  Moving all extremities right-sided weakness from prior surgery. Psychiatric: Alert awake.   Labs on Admission: I have personally reviewed following labs and imaging studies  CBC: Recent Labs  Lab 11/07/23 1945 11/07/23 1948  WBC  --  9.3  NEUTROABS  --  4.6  HGB 15.3 14.4  HCT 45.0 45.4  MCV  --  94.4  PLT  --  187   Basic Metabolic Panel: Recent Labs  Lab 11/07/23 1945 11/07/23 1948  NA 141 139  K 4.6 4.6  CL 104 105  CO2  --  24  GLUCOSE 85 88  BUN 12 10  CREATININE 1.10 1.14  CALCIUM   --  8.9   GFR: Estimated Creatinine Clearance: 88.2 mL/min (by C-G formula based on SCr of 1.14 mg/dL). Liver Function Tests: Recent Labs  Lab 11/07/23 1948  AST 20  ALT 26  ALKPHOS 110  BILITOT 0.5  PROT 7.0  ALBUMIN  3.3*   No results for input(s): LIPASE, AMYLASE in the last 168 hours. No results for input(s): AMMONIA in the last 168 hours. Coagulation Profile: Recent Labs  Lab 11/07/23 1948  INR 1.0   Cardiac Enzymes: No results for input(s):  CKTOTAL, CKMB, CKMBINDEX, TROPONINI in the last 168 hours. BNP (last 3 results) No results for input(s): PROBNP in the last 8760 hours. HbA1C: No results for input(s): HGBA1C in the last 72  hours. CBG: Recent Labs  Lab 11/07/23 2023  GLUCAP 79   Lipid Profile: No results for input(s): CHOL, HDL, LDLCALC, TRIG, CHOLHDL, LDLDIRECT in the last 72 hours. Thyroid  Function Tests: No results for input(s): TSH, T4TOTAL, FREET4, T3FREE, THYROIDAB in the last 72 hours. Anemia Panel: No results for input(s): VITAMINB12, FOLATE, FERRITIN, TIBC, IRON, RETICCTPCT in the last 72 hours. Urine analysis:    Component Value Date/Time   COLORURINE YELLOW 06/27/2022 0952   APPEARANCEUR Clear 02/07/2023 1138   LABSPEC 1.009 06/27/2022 0952   PHURINE 6.0 06/27/2022 0952   GLUCOSEU Negative 02/07/2023 1138   HGBUR NEGATIVE 06/27/2022 0952   BILIRUBINUR Negative 02/07/2023 1138   KETONESUR NEGATIVE 06/27/2022 0952   PROTEINUR Negative 02/07/2023 1138   PROTEINUR NEGATIVE 06/27/2022 0952   UROBILINOGEN 0.2 09/03/2020 1523   UROBILINOGEN 0.2 04/04/2011 2241   NITRITE Negative 02/07/2023 1138   NITRITE NEGATIVE 06/27/2022 0952   LEUKOCYTESUR Negative 02/07/2023 1138   LEUKOCYTESUR NEGATIVE 06/27/2022 0952   Sepsis Labs: @LABRCNTIP (procalcitonin:4,lacticidven:4) )No results found for this or any previous visit (from the past 240 hours).   Radiological Exams on Admission: CT HEAD CODE STROKE WO CONTRAST Result Date: 11/07/2023 EXAM: CT HEAD WITHOUT CONTRAST 11/07/2023 07:50:20 PM TECHNIQUE: CT of the head was performed without the administration of intravenous contrast. Automated exposure control, iterative reconstruction, and/or weight based adjustment of the mA/kV was utilized to reduce the radiation dose to as low as reasonably achievable. COMPARISON: 08/12/2020 CLINICAL HISTORY: Neuro deficit, acute, stroke suspected. FINDINGS: BRAIN AND VENTRICLES: No acute hemorrhage. No evidence of acute infarct. No hydrocephalus. No extra-axial collection. No mass effect or midline shift. There is encephalomalacia of the inferior left frontal lobe and the bilateral  superior frontoparietal regions junctions. ASPECTS is 10. ORBITS: No acute abnormality. SINUSES: No acute abnormality. SOFT TISSUES AND SKULL: No acute soft tissue abnormality. No skull fracture. Remote vertex craniotomy with cranioplasty mesh. IMPRESSION: 1. No acute intracranial abnormality. ASPECTS 10. 2. Encephalomalacia involving the inferior left frontal lobe and bilateral superior frontoparietal junctions 3. Findings communicated to Dr. Ashish Arora at 7:59 pm on 11/07/2023. Electronically signed by: Franky Stanford MD 11/07/2023 07:59 PM EDT RP Workstation: [id number]    EKG: Independently reviewed.  Normal sinus rhythm.  Assessment/Plan Principal Problem:   Seizure (HCC) Active Problems:   Essential hypertension   GERD (gastroesophageal reflux disease)   Status post craniectomy   Controlled type 2 diabetes mellitus with hyperglycemia (HCC)   HLD (hyperlipidemia)   BPH (benign prostatic hyperplasia)    Seizures -     appreciate neurology consult.  Discussed with neurologist.  Patient started on Vimpat.  Plan is to get MRI brain and EEG.  Closely monitor. History of meningioma status post craniectomy with mild right-sided weakness.  Follow MRI brain. Diabetes mellitus type 2 recently started on Jardiance  and also takes glimepiride .  Presently on sliding scale coverage.  Check hemoglobin A1c. Right wrist pain patient states his wrist pain started after seizure episode.  Has mild tenderness.  Will check x-rays. Hypertension on propranolol  and Lasix . BPH on tamsulosin  and finasteride . Hyperlipidemia on statins. GERD on PPI. OSA is mentioned in the chart but patient wife states that patient does not use CPAP.  Since patient has seizures will need close monitoring further workup and more than 2 midnight stay.  DVT prophylaxis: Lovenox. Code Status: Full code. Family Communication: Patient's wife. Disposition Plan: Monitored bed. Consults called: Neurology. Admission status:  Observation.

## 2023-11-07 NOTE — ED Provider Notes (Signed)
 [place] EMERGENCY DEPARTMENT AT [facility] Provider Note   CSN: [id number] Arrival date & time: 11/07/23  8061  An emergency department physician performed an initial assessment on this suspected stroke patient at 53.  Patient presents with: Code Stroke   Anthony Downs is a 73 y.o. male.   Patient is a 73 year old male with a history of benign brain tumor, prior subdural hemorrhage, DVT no longer on anticoagulation, GERD, diabetes and hypertension with history of seizures who was on keppra  in the past who is presenting today as a code stroke.  Family reported he was last known normal at 630 when they went in and found him unresponsive.  EMS reports when they got there patient was waking up but he was neglecting the right side and unable to move his arm and leg.  Upon arrival here patient is not having any neglect but is still having some difficulty raising his right leg.  He is amnestic to what happened.  He denies any chest pain or shortness of breath.  There was no reported falls per EMS he was in bed when this occurred.  The history is provided by the patient, the spouse, the EMS personnel and medical records.       Prior to Admission medications   Medication Sig Start Date End Date Taking? Authorizing Provider  ACCU-CHEK GUIDE TEST test strip daily. 02/03/23   [provider]  albuterol  (PROVENTIL ) 2 MG tablet Take 1 tablet (2 mg total) by mouth 3 (three) times daily. 06/03/23   Bertell Satterfield, MD  albuterol  (VENTOLIN  HFA) 108 (90 Base) MCG/ACT inhaler Inhale 2 puffs into the lungs every 4 (four) hours. 11/12/21   [provider]  aspirin  EC 81 MG tablet Take 81 mg by mouth daily. Swallow whole.    [provider]  atorvastatin  (LIPITOR) 10 MG tablet Take 1 tablet (10 mg total) by mouth at bedtime. 08/15/23   Alvan Dorn FALCON, MD  b complex vitamins capsule Take 1 capsule by mouth daily.    [provider]  carbidopa -levodopa   (SINEMET ) 10-100 MG tablet Take 1 tablet by mouth 3 (three) times daily. Take 1 tablet twice daily for one week, then increase to 3 times daily. 05/04/23   Babs Arthea DASEN, MD  cetirizine (ZYRTEC) 10 MG tablet Take 10 mg by mouth daily.    [provider]  cyanocobalamin  (VITAMIN B12) 1000 MCG tablet Take 1,000 mcg by mouth daily.    [provider]  empagliflozin  (JARDIANCE ) 10 MG TABS tablet Take 10 mg by mouth daily.    [provider]  empagliflozin  (JARDIANCE ) 25 MG TABS tablet Take 1 tablet (25 mg total) by mouth daily. 02/22/23     finasteride  (PROSCAR ) 5 MG tablet Take 1 tablet (5 mg total) by mouth daily. 04/19/23   Nieves Cough, MD  finasteride  (PROSCAR ) 5 MG tablet Take 1 tablet (5 mg total) by mouth daily. 02/07/23   Nieves Cough, MD  furosemide  (LASIX ) 20 MG tablet Take 1 tablet (20 mg total) by mouth 2 (two) times daily. 10/05/23   Alvan Dorn FALCON, MD  glimepiride  (AMARYL ) 2 MG tablet Take 2 mg by mouth in the morning and at bedtime.    [provider]  glimepiride  (AMARYL ) 4 MG tablet Take 1 tablet (4 mg total) by mouth daily. 06/28/23   Bertell Satterfield, MD  glimepiride  (AMARYL ) 4 MG tablet Take 2 tablets (8 mg total) by mouth daily. 01/07/23   Bertell Satterfield, MD  glucose  blood test strip Use to test blood sugar daily. 02/02/23     magnesium  oxide (MAG-OX) 400 (240 Mg) MG tablet Take 1 tablet (400 mg total) by mouth daily. 09/14/22   Dunn, Dayna N, PA-C  pantoprazole  (PROTONIX ) 40 MG tablet Take 1 tablet (40 mg total) by mouth daily. 06/17/20   Love, Sharlet RAMAN, PA-C  potassium chloride  SA (KLOR-CON  M) 20 MEQ tablet Take 1 tablet (20 mEq total) by mouth 2 (two) times daily. 10/05/23   Alvan Dorn FALCON, MD  Primidone  125 MG TABS Take 1 tablet (125 mg) by mouth at bedtime. 08/31/23   Babs Arthea DASEN, MD  propranolol  (INDERAL ) 20 MG tablet Take 1 tablet (20 mg total) by mouth 3 (three) times daily. 04/20/23   Babs Arthea DASEN, MD  propranolol   (INDERAL ) 20 MG tablet Take 2 tablets (40 mg total) by mouth daily. 03/21/23     Propylene Glycol-Glycerin (SOOTHE OP) Apply 1 drop to eye daily as needed (dry eyes).    [provider]  silver sulfADIAZINE (SILVADENE) 1 % cream Apply 1 Application topically 2 (two) times daily. Patient taking differently: Apply 1 Application topically daily. 02/03/23   [provider]  tamsulosin  (FLOMAX ) 0.4 MG CAPS capsule Take 1 capsule (0.4 mg total) by mouth in the morning and at bedtime. 06/14/23   Nieves Cough, MD  tamsulosin  (FLOMAX ) 0.4 MG CAPS capsule Take 1 capsule (0.4 mg total) by mouth daily after supper. 02/07/23   Nieves Cough, MD  tamsulosin  (FLOMAX ) 0.4 MG CAPS capsule Take 1 capsule (0.4 mg total) by mouth daily after supper. 04/19/23   Nieves Cough, MD  Vitamin D, Ergocalciferol, (DRISDOL) 1.25 MG (50000 UNIT) CAPS capsule Take 50,000 Units by mouth once a week. 08/05/22   [provider]  zinc  gluconate 50 MG tablet Take 50 mg by mouth daily.    [provider]    Allergies: Sinemet  [carbidopa -levodopa ], Ativan  [lorazepam ], and Haldol  [haloperidol ]    Review of Systems  Updated Vital Signs BP (!) 141/59 (BP Location: Right Arm)   Pulse 72   Temp (!) 97.5 F (36.4 C) (Oral)   Resp 20   Ht 6' 1 (1.854 m)   Wt (!) 154.4 kg   SpO2 95%   BMI 44.91 kg/m   Physical Exam Vitals and nursing note reviewed.  Constitutional:      General: He is not in acute distress.    Appearance: He is well-developed.  HENT:     Head: Normocephalic and atraumatic.  Eyes:     Conjunctiva/sclera: Conjunctivae normal.     Pupils: Pupils are equal, round, and reactive to light.  Cardiovascular:     Rate and Rhythm: Normal rate and regular rhythm.     Pulses: Normal pulses.     Heart sounds: No murmur heard. Pulmonary:     Effort: Pulmonary effort is normal. No respiratory distress.     Breath sounds: Normal breath sounds. No wheezing or rales.  Abdominal:      General: There is no distension.     Palpations: Abdomen is soft.     Tenderness: There is no abdominal tenderness. There is no guarding or rebound.  Musculoskeletal:        General: No tenderness. Normal range of motion.     Cervical back: Normal range of motion and neck supple.  Skin:    General: Skin is warm and dry.     Findings: No erythema or rash.  Neurological:     Mental Status:  He is alert and oriented to person, place, and time.     Comments: 4/5 strength of right leg with mild drift, mild flattening of the nasolabial fold on the right.  Patient is mentating normally with no neglect at this time.  5-5 strength in the right and left upper extremity and there left lower extremity  Psychiatric:        Behavior: Behavior normal.     (all labs ordered are listed, but only abnormal results are displayed) Labs Reviewed  APTT - Abnormal; Notable for the following components:      Result Value   aPTT 22 (*)    All other components within normal limits  I-STAT CHEM 8, ED - Abnormal; Notable for the following components:   Calcium , Ion 1.11 (*)    All other components within normal limits  PROTIME-INR  CBC  DIFFERENTIAL  COMPREHENSIVE METABOLIC PANEL WITH GFR  ETHANOL  CBG MONITORING, ED    EKG: EKG Interpretation Date/Time:  Monday November 07 2023 19:55:21 EDT Ventricular Rate:  73 PR Interval:  147 QRS Duration:  93 QT Interval:  393 QTC Calculation: 433 R Axis:   71  Text Interpretation: Sinus rhythm Low voltage, precordial leads No significant change since last tracing Confirmed by Doretha Folks ([id number]) on 11/07/2023 8:16:36 PM  Radiology: CT HEAD CODE STROKE WO CONTRAST Result Date: 11/07/2023 EXAM: CT HEAD WITHOUT CONTRAST 11/07/2023 07:50:20 PM TECHNIQUE: CT of the head was performed without the administration of intravenous contrast. Automated exposure control, iterative reconstruction, and/or weight based adjustment of the mA/kV was utilized to  reduce the radiation dose to as low as reasonably achievable. COMPARISON: 08/12/2020 CLINICAL HISTORY: Neuro deficit, acute, stroke suspected. FINDINGS: BRAIN AND VENTRICLES: No acute hemorrhage. No evidence of acute infarct. No hydrocephalus. No extra-axial collection. No mass effect or midline shift. There is encephalomalacia of the inferior left frontal lobe and the bilateral superior frontoparietal regions junctions. ASPECTS is 10. ORBITS: No acute abnormality. SINUSES: No acute abnormality. SOFT TISSUES AND SKULL: No acute soft tissue abnormality. No skull fracture. Remote vertex craniotomy with cranioplasty mesh. IMPRESSION: 1. No acute intracranial abnormality. ASPECTS 10. 2. Encephalomalacia involving the inferior left frontal lobe and bilateral superior frontoparietal junctions 3. Findings communicated to Dr. Ashish Arora at 7:59 pm on 11/07/2023. Electronically signed by: Franky Stanford MD 11/07/2023 07:59 PM EDT RP Workstation: [id number]     Procedures   Medications Ordered in the ED  sodium chloride  flush (NS) 0.9 % injection 3 mL (has no administration in time range)  lacosamide (VIMPAT) 200 mg in sodium chloride  0.9 % 25 mL IVPB (has no administration in time range)  lacosamide (VIMPAT) tablet 50 mg (has no administration in time range)                                    Medical Decision Making Amount and/or Complexity of Data Reviewed Independent Historian: EMS External Data Reviewed: notes. Labs: ordered. Decision-making details documented in ED Course. Radiology: ordered and independent interpretation performed. Decision-making details documented in ED Course. ECG/medicine tests: ordered and independent interpretation performed. Decision-making details documented in ED Course.   Pt with multiple medical problems and comorbidities and presenting today with a complaint that caries a high risk for morbidity and mortality.  Presenting here today as a code stroke however with  further investigation and history it seems that patient most likely had a seizure today.  Symptoms are  clearing which is more consistent with seizure activity.  Patient has not on antiepileptics at this time.  He discontinued Depakote  a few months ago as it was causing worsening tremor and issues with his Parkinson's medication.  Vital signs are reassuring.  Patient without evidence of infectious etiology.  I have independently visualized and interpreted pt's images today.  Head CT without evidence of bleed today. Significant encephalomalacia on CT from prior brain tumor which is most likely the cause of recurrent seizure. Patient was seen immediately by the stroke team.  Patient is felt to be a seizure and was loaded with antiepileptics with Vimpat and neurology recommended admission for observation.  I independently interpreted patient's labs and EKG.  EKG is within normal limits, CBC, Chem-8 and INR are all within normal limits.  Will consult medicine for admission      Final diagnoses:  Seizure [facility])    ED Discharge Orders     None          Doretha Folks, MD 11/07/23 2019

## 2023-11-07 NOTE — ED Triage Notes (Signed)
 Pt BIB [facility] from home. Pt had onset of R side weakness and L side gaze, followed by episode of unresponsiveness. On EMS arrival became more responsive and on arrival ER pt had returned to baseline, a/ox4.  Pt has hx of brain tumor removal and seizures.

## 2023-11-07 NOTE — Code Documentation (Signed)
 Stroke Response Nurse Documentation Code Documentation  Anthony Downs is a 73 y.o. 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 On No antithrombotic. Code stroke was activated by EMS.   Patient from home where he was LKW at 1830 and now complaining of right sided weakness, right facial droop and left gaze.   Stroke team at the bedside on patient arrival. Labs drawn and patient cleared for CT by Dr. Doretha. Patient to CT with team. NIHSS 0, see documentation for details and code stroke times. Patient with no focal deficits on exam. The following imaging was completed:  CT Head. Patient is not a candidate for IV Thrombolytic due to resolved deficits. Patient is not a candidate for IR due to low suspicion of stroke.   Care Plan: Neuro checks q2 hrs.    Bedside handoff with ED RN Tiffany.    Griselda Alm ORN  Rapid Response RN

## 2023-11-08 ENCOUNTER — Telehealth: Payer: Self-pay | Admitting: Neurology

## 2023-11-08 ENCOUNTER — Observation Stay (HOSPITAL_COMMUNITY)

## 2023-11-08 ENCOUNTER — Encounter: Payer: Self-pay | Admitting: Neurology

## 2023-11-08 ENCOUNTER — Observation Stay (HOSPITAL_BASED_OUTPATIENT_CLINIC_OR_DEPARTMENT_OTHER)

## 2023-11-08 ENCOUNTER — Other Ambulatory Visit (HOSPITAL_COMMUNITY): Payer: Self-pay

## 2023-11-08 ENCOUNTER — Ambulatory Visit: Payer: Self-pay | Admitting: Physical Therapy

## 2023-11-08 DIAGNOSIS — D329 Benign neoplasm of meninges, unspecified: Secondary | ICD-10-CM

## 2023-11-08 DIAGNOSIS — R569 Unspecified convulsions: Secondary | ICD-10-CM | POA: Diagnosis not present

## 2023-11-08 DIAGNOSIS — R609 Edema, unspecified: Secondary | ICD-10-CM

## 2023-11-08 DIAGNOSIS — G40909 Epilepsy, unspecified, not intractable, without status epilepticus: Secondary | ICD-10-CM | POA: Diagnosis not present

## 2023-11-08 LAB — COMPREHENSIVE METABOLIC PANEL WITH GFR
ALT: 23 U/L (ref 0–44)
AST: 20 U/L (ref 15–41)
Albumin: 2.9 g/dL — ABNORMAL LOW (ref 3.5–5.0)
Alkaline Phosphatase: 96 U/L (ref 38–126)
Anion gap: 8 (ref 5–15)
BUN: 11 mg/dL (ref 8–23)
CO2: 22 mmol/L (ref 22–32)
Calcium: 8.5 mg/dL — ABNORMAL LOW (ref 8.9–10.3)
Chloride: 107 mmol/L (ref 98–111)
Creatinine, Ser: 0.96 mg/dL (ref 0.61–1.24)
GFR, Estimated: >60 mL/min (ref >60)
Glucose, Bld: 118 mg/dL — ABNORMAL HIGH (ref 70–99)
Potassium: 4.2 mmol/L (ref 3.5–5.1)
Sodium: 137 mmol/L (ref 135–145)
Total Bilirubin: 0.6 mg/dL (ref 0.0–1.2)
Total Protein: 6.2 g/dL — ABNORMAL LOW (ref 6.5–8.1)

## 2023-11-08 LAB — CREATININE, SERUM
Creatinine, Ser: 0.94 mg/dL (ref 0.61–1.24)
GFR, Estimated: >60 mL/min (ref >60)

## 2023-11-08 LAB — CBC
HCT: 39.3 % (ref 39.0–52.0)
HCT: 42.5 % (ref 39.0–52.0)
Hemoglobin: 12.6 g/dL — ABNORMAL LOW (ref 13.0–17.0)
Hemoglobin: 13.6 g/dL (ref 13.0–17.0)
MCH: 29.8 pg (ref 26.0–34.0)
MCH: 30.1 pg (ref 26.0–34.0)
MCHC: 32 g/dL (ref 30.0–36.0)
MCHC: 32.1 g/dL (ref 30.0–36.0)
MCV: 93.2 fL (ref 80.0–100.0)
MCV: 93.8 fL (ref 80.0–100.0)
Platelets: 171 K/uL (ref 150–400)
Platelets: 185 K/uL (ref 150–400)
RBC: 4.19 MIL/uL — ABNORMAL LOW (ref 4.22–5.81)
RBC: 4.56 MIL/uL (ref 4.22–5.81)
RDW: 13.9 % (ref 11.5–15.5)
RDW: 13.9 % (ref 11.5–15.5)
WBC: 10.3 K/uL (ref 4.0–10.5)
WBC: 10.4 K/uL (ref 4.0–10.5)
nRBC: 0 % (ref 0.0–0.2)
nRBC: 0 % (ref 0.0–0.2)

## 2023-11-08 LAB — CBG MONITORING, ED
Glucose-Capillary: 129 mg/dL — ABNORMAL HIGH (ref 70–99)
Glucose-Capillary: 127 mg/dL — ABNORMAL HIGH (ref 70–99)

## 2023-11-08 MED ORDER — POTASSIUM CHLORIDE CRYS ER 20 MEQ PO TBCR: 20.0000 meq | EXTENDED_RELEASE_TABLET | Freq: Every day | ORAL | Status: DC

## 2023-11-08 MED ORDER — GLIMEPIRIDE 4 MG PO TABS: 8.0000 mg | ORAL_TABLET | Freq: Every day | ORAL | Status: AC

## 2023-11-08 MED ORDER — LACOSAMIDE 50 MG PO TABS
50.0000 mg | ORAL_TABLET | Freq: Two times a day (BID) | ORAL | 2 refills | Status: DC
  Filled 2023-11-08: qty 120, 60d supply, fill #0

## 2023-11-08 MED ORDER — PROPRANOLOL HCL 20 MG PO TABS: 20.0000 mg | ORAL_TABLET | Freq: Two times a day (BID) | ORAL | Status: DC

## 2023-11-08 MED ORDER — FUROSEMIDE 20 MG PO TABS: 20.0000 mg | ORAL_TABLET | Freq: Every day | ORAL | Status: DC

## 2023-11-08 MED ORDER — GADOBUTROL 1 MMOL/ML IV SOLN
10.0000 mL | Freq: Once | INTRAVENOUS | Status: AC | PRN
  Administered 2023-11-08: 10 mL via INTRAVENOUS

## 2023-11-08 NOTE — Telephone Encounter (Signed)
 Wife has called to report pt was just released from hospital as a result of recent seizure activity, she would like a call to discuss, she accepted next available appointment with wait list

## 2023-11-08 NOTE — Progress Notes (Signed)
 Orthopedic Tech Progress Note Patient Details:  KELSON QUEENAN 06/01/1950 [id number]  Ortho Devices Type of Ortho Device: Velcro wrist splint Ortho Device/Splint Location: RUE Ortho Device/Splint Interventions: Ordered, Application, Adjustment   Post Interventions Patient Tolerated: Well Instructions Provided: Care of device  Delanna LITTIE Pac 11/08/2023, 12:04 PM

## 2023-11-08 NOTE — Progress Notes (Signed)
 EEG complete - results pending

## 2023-11-08 NOTE — Progress Notes (Signed)
 Physical Therapy Quick Note  PT has completed initial evaluation.    Overall, patient at mod assistance for bed mobility and CGA for transfers and ambulation.   PT Follow up recommended: Outpatient PT Equipment recommended:  None recommended Complete evaluation note to follow.     Bernardino JINNY Ruth, PT, DPT Acute Rehabilitation Office [phone]

## 2023-11-08 NOTE — Plan of Care (Signed)
 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 And mild additional nodular dural thickening. No regional edema or significant mass effect.  Recs: - continue Vimpat 50mg  BID. - no further seizure reported inpatient. - follow up outpatient with neurology. - no driving for 6months. Has to be seizure free before he can resume driving. - full seizure precautions listed below and pasted in in discharge instructions.  Plan discussed with Dr. Patsy.  Marti Acebo [facility]   Seizure precautions: Per   [place] statutes, patients with seizures are not allowed to drive until they have been seizure-free for six months and cleared by a physician    Use caution when using heavy equipment or power tools. Avoid working on ladders or at heights. Take showers instead of baths. Ensure the water  temperature is not too high on the home water  heater. Do not go swimming alone. Do not lock yourself in a room alone (i.e. bathroom). When caring for infants or small children, sit down when holding, feeding, or changing them to minimize risk of injury to the child in the event you have a seizure. Maintain good sleep hygiene. Avoid alcohol .    If patient has another seizure, call 911 and bring them back to the ED if: A.  The seizure lasts longer than 5 minutes.      B.  The patient doesn't wake shortly after the seizure or has new problems such as difficulty seeing, speaking or moving following the seizure C.  The patient was injured during the seizure D.  The patient has a temperature over 102 F (39C) E.  The patient vomited during the seizure and now is having trouble breathing    During the Seizure   - First, ensure adequate ventilation and place patients on the floor on their left side   Loosen clothing around the neck and ensure the airway is patent. If the patient is clenching the teeth, do not force the mouth open with any object as this can cause severe damage - Remove all items from the surrounding that can be hazardous. The patient may be oblivious to what's happening and may not even know what he or she is doing. If the patient is confused and wandering, either gently guide him/her away and block access to outside areas - Reassure the individual and be comforting - Call 911. In most cases, the seizure ends before EMS arrives. However, there are cases when seizures may last over 3 to 5 minutes. Or the individual may have developed breathing difficulties or severe injuries. If a pregnant patient or a person with diabetes develops a seizure, it is prudent to call an ambulance. - Finally, if the patient does not regain full consciousness, then call EMS. Most patients will remain confused for about 45 to 90 minutes after a seizure, so you must use judgment in calling for help. - Avoid restraints but make sure the patient is in a bed with padded side rails - Place the individual in a lateral position with the neck slightly flexed; this will help the saliva drain from the mouth and prevent the tongue from falling backward - Remove all nearby furniture and other hazards from the area - Provide verbal assurance as the individual is regaining consciousness - Provide the patient with privacy if possible - Call for help and  start treatment as ordered by the caregiver    After the Seizure (Postictal Stage)   After a seizure, most patients experience confusion, fatigue, muscle pain and/or a headache. Thus, one should permit the individual to sleep. For the next few days, reassurance is essential. Being calm and helping reorient the person is also of importance.   Most seizures are painless and end spontaneously. Seizures are not harmful to others but can lead to complications such as  stress on the lungs, brain and the heart. Individuals with prior lung problems may develop labored breathing and respiratory distress.

## 2023-11-08 NOTE — ED Notes (Addendum)
 Assuming pt care, pt bib ems for seizure episode and RT arm pain/limited mobility after seizure episode at 7pm while laying in bed last night. Pt reports med hx. Brain tumor, seizure, pt sts RT sided weakness since brain sx 3 years ago, uses walker and wheelchair at home. Pt aaox4, mae, skin warm/dry. Call bell within reach.

## 2023-11-08 NOTE — Hospital Course (Signed)
 Anthony Downs is a 73 y.o. 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   As per patient's wife who provided the history, they were about to go out to shopping when she heard a sound and went to check on her husband he was lying on his side on the bed unresponsive and started getting tonic clonic jerks and EMS was called.  EMS on arrival found that patient had left-sided gaze and aphasic.  Patient has chronic right-sided weakness from previous surgery.  At the time of my exam patient is complaining of right wrist pain after the incident.   ED Course: On arrival patient was seen by neurology.  CT head was unremarkable.  Patient symptoms were concerning for seizures and was loaded with Vimpat.    Assessment/Plan  Seizure - s/p vimpat load; continued at discharge as well - Previously on Depakote  per his wife but stopped in 2024 due to lethargy -EEG performed during hospitalization -MRI brain obtained showing recurrent meningiomas near resection site; neurosurgery informed and will plan on seeing patient outpatient.  Previously seen by Dr. Cheryle 2022 -Outpatient follow-up with neurology also planned  Hx meningioma - s/p resection 2022 - Residual right sided weakness but some progression with therapy recently per wife -Neurosurgery aware of MRI findings with plans for outpatient follow-up  Right wrist pain -Mild swelling noted but no acute abnormalities noted on x-rays on admission -Evaluated by PT; wrist brace ordered prior to discharge  DM II - Continue home regimen  HTN - Continue home regimen  BPH - Continue Flomax  and  finasteride   HLD - Continue home regimen  GERD - Continue PPI  OSA - Not on CPAP at home

## 2023-11-08 NOTE — ED Notes (Signed)
 EEG tech at bedside.

## 2023-11-08 NOTE — Discharge Summary (Signed)
 Physician Discharge Summary   Anthony Downs:[id number] DOB: [birth year] DOA: 11/07/2023  PCP: Bertell Satterfield, MD  Admit date: 11/07/2023 Discharge date: 11/08/2023  Admitted From: Home Disposition:  Home Discharging physician: Alm Apo, MD Barriers to discharge: none  Recommendations at discharge: Follow up with neurology and neurosurgery  Discharge Condition: stable CODE STATUS: Full  Diet recommendation:  Diet Orders (From admission, onward)     Start     Ordered   11/08/23 0000  Diet - low sodium heart healthy        11/08/23 1416   11/08/23 0000  Diet Carb Modified        11/08/23 1416   11/07/23 2108  Diet heart healthy/carb modified Room service appropriate? Yes; Fluid consistency: Thin  Diet effective now       Question Answer Comment  Diet-HS Snack? Nothing   Room service appropriate? Yes   Fluid consistency: Thin      11/07/23 2107            Hospital Course: Anthony Downs is a 73 y.o. 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   As per patient's wife who provided the history, they were about to go out to shopping when she heard a sound and went to check on her husband he was lying on his side on the bed unresponsive and started getting tonic clonic jerks and EMS was called.  EMS on arrival found that patient had left-sided gaze and aphasic.  Patient has chronic right-sided weakness from previous surgery.  At the time of my exam patient is complaining of right wrist pain after the incident.   ED Course: On arrival patient was seen by neurology.  CT head was unremarkable.  Patient  symptoms were concerning for seizures and was loaded with Vimpat.    Assessment/Plan  Seizure - s/p vimpat load; continued at discharge as well - Previously on Depakote  per his wife but stopped in 2024 due to lethargy -EEG performed during hospitalization -MRI brain obtained showing recurrent meningiomas near resection site; neurosurgery informed and will plan on seeing patient outpatient.  Previously seen by Dr. Cheryle 2022 -Outpatient follow-up with neurology also planned  Hx meningioma - s/p resection 2022 - Residual right sided weakness but some progression with therapy recently per wife -Neurosurgery aware of MRI findings with plans for outpatient follow-up  Right wrist pain -Mild swelling noted but no acute abnormalities noted on x-rays on admission -Evaluated by PT; wrist brace ordered prior to discharge  DM II - Continue home regimen  HTN - Continue home regimen  BPH - Continue Flomax  and finasteride   HLD - Continue home regimen  GERD - Continue PPI  OSA - Not on CPAP at home   The patient's acute and chronic medical conditions were treated accordingly. On day of discharge, patient was felt deemed stable for discharge. Patient/family member advised to call PCP or come back to ER if needed.   Principal Diagnosis: Seizure [facility])  Discharge Diagnoses: Active Hospital Problems   Diagnosis Date Noted   Seizure (HCC) 08/13/2020    Priority: 1.   Meningioma (HCC) 05/02/2020    Priority: 2.   Controlled type 2 diabetes mellitus  with hyperglycemia (HCC) 11/07/2023   HLD (hyperlipidemia) 11/07/2023   BPH (benign prostatic hyperplasia) 11/07/2023   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   GERD (gastroesophageal reflux disease) 07/06/2020   Essential hypertension     Resolved Hospital Problems  No resolved problems to display.     Discharge Instructions     Diet - low sodium heart healthy   Complete by: As directed    Diet Carb Modified   Complete by: As  directed    Increase activity slowly   Complete by: As directed       Allergies as of 11/08/2023       Reactions   Sinemet  [carbidopa -levodopa ] Other (See Comments)   sedation   Ativan  [lorazepam ] Other (See Comments)   Worsening confusion   Haldol  [haloperidol ] Other (See Comments)   Worsening confusion        Medication List     STOP taking these medications    Primidone  125 MG Tabs       TAKE these medications    albuterol  108 (90 Base) MCG/ACT inhaler Commonly known as: VENTOLIN  HFA Inhale 2 puffs into the lungs every 6 (six) hours as needed for wheezing or shortness of breath.   aspirin  EC 81 MG tablet Take 81 mg by mouth at bedtime. Swallow whole.   atorvastatin  10 MG tablet Commonly known as: LIPITOR Take 1 tablet (10 mg total) by mouth at bedtime.   b complex vitamins capsule Take 1 capsule by mouth daily.   cyanocobalamin  1000 MCG tablet Commonly known as: VITAMIN B12 Take 1,000 mcg by mouth daily.   finasteride  5 MG tablet Commonly known as: PROSCAR  Take 1 tablet (5 mg total) by mouth daily.   furosemide  20 MG tablet Commonly known as: LASIX  Take 1 tablet (20 mg total) by mouth daily.   glimepiride  4 MG tablet Commonly known as: AMARYL  Take 2 tablets (8 mg total) by mouth daily. What changed: when to take this   glucose blood test strip Use to test blood sugar daily.   Jardiance  25 MG Tabs tablet Generic drug: empagliflozin  Take 1 tablet (25 mg total) by mouth daily.   lacosamide 50 MG Tabs tablet Commonly known as: VIMPAT Take 1 tablet (50 mg total) by mouth 2 (two) times daily.   magnesium  oxide 400 (240 Mg) MG tablet Commonly known as: MAG-OX Take 1 tablet (400 mg total) by mouth daily. What changed: when to take this   pantoprazole  40 MG tablet Commonly known as: PROTONIX  Take 1 tablet (40 mg total) by mouth daily.   potassium chloride  SA 20 MEQ tablet Commonly known as: KLOR-CON  M Take 1 tablet (20 mEq total) by mouth  daily.   propranolol  20 MG tablet Commonly known as: INDERAL  Take 1 tablet (20 mg total) by mouth 2 (two) times daily.   SOOTHE OP Apply 1 drop to eye daily as needed (dry eyes).   tamsulosin  0.4 MG Caps capsule Commonly known as: FLOMAX  Take 1 capsule (0.4 mg total) by mouth in the morning and at bedtime.   Vitamin D (Ergocalciferol) 1.25 MG (50000 UNIT) Caps capsule Commonly known as: DRISDOL Take 50,000 Units by mouth once a week.   Vitamin D3 Maximum Strength 125 MCG (5000 UT) capsule Generic drug: Cholecalciferol Take 5,000 Units by mouth daily.   zinc  gluconate 50 MG tablet Take 50 mg by mouth daily.        Follow-up Information     Cheryle Debby LABOR, MD. Schedule an appointment as soon as possible  for a visit in 1 week(s).   Specialty: Neurosurgery        [facility]. Schedule an appointment as soon as possible for a visit in 1 month(s).   Contact information: [address]     [address]  [address] [phone]               Allergies  Allergen Reactions   Sinemet  [Carbidopa -Levodopa ] Other (See Comments)    sedation   Ativan  [Lorazepam ] Other (See Comments)    Worsening confusion   Haldol  [Haloperidol ] Other (See Comments)    Worsening confusion    Consultations: Neurology Discussed with neurosurgery   Procedures:   Discharge Exam: BP (!) 147/76   Pulse 64   Temp 97.6 F (36.4 C) (Oral)   Resp 16   Ht 6' 1 (1.854 m)   Wt (!) 154.4 kg   SpO2 97%   BMI 44.91 kg/m  Physical Exam Constitutional:      General: He is not in acute distress.    Appearance: Normal appearance.  HENT:     Head: Normocephalic and atraumatic.     Mouth/Throat:     Mouth: Mucous membranes are moist.  Eyes:     Extraocular Movements: Extraocular movements intact.  Cardiovascular:     Rate and Rhythm: Normal rate and regular rhythm.  Pulmonary:     Effort: Pulmonary effort is normal. No respiratory  distress.     Breath sounds: Normal breath sounds. No wheezing.  Abdominal:     General: Bowel sounds are normal. There is no distension.     Palpations: Abdomen is soft.     Tenderness: There is no abdominal tenderness.  Musculoskeletal:     Cervical back: Normal range of motion and neck supple.     Right lower leg: Edema (chronic, baseline) present.     Left lower leg: Edema (chronic, baseline) present.  Skin:    General: Skin is warm and dry.  Neurological:     Mental Status: He is alert.     Comments: Chronic 4/5 RUE/RLE strength; tremors noted in B/L UE with strength testing. AO to name, place, year, president   Psychiatric:        Mood and Affect: Mood normal.        Behavior: Behavior normal.      The results of significant diagnostics from this hospitalization (including imaging, microbiology, ancillary and laboratory) are listed below for reference.   Microbiology: No results found for this or any previous visit (from the past 240 hours).   Labs: BNP (last 3 results) No results for input(s): BNP in the last 8760 hours. Basic Metabolic Panel: Recent Labs  Lab 11/07/23 1945 11/07/23 1948 11/08/23 0003 11/08/23 0448  NA 141 139  --  137  K 4.6 4.6  --  4.2  CL 104 105  --  107  CO2  --  24  --  22  GLUCOSE 85 88  --  118*  BUN 12 10  --  11  CREATININE 1.10 1.14 0.94 0.96  CALCIUM   --  8.9  --  8.5*   Liver Function Tests: Recent Labs  Lab 11/07/23 1948 11/08/23 0448  AST 20 20  ALT 26 23  ALKPHOS 110 96  BILITOT 0.5 0.6  PROT 7.0 6.2*  ALBUMIN  3.3* 2.9*   No results for input(s): LIPASE, AMYLASE in the last 168 hours. No results for input(s): AMMONIA in the last 168 hours. CBC: Recent Labs  Lab 11/07/23 1945 11/07/23 1948 11/08/23 0003 11/08/23 0448  WBC  --  9.3 10.4 10.3  NEUTROABS  --  4.6  --   --   HGB 15.3 14.4 12.6* 13.6  HCT 45.0 45.4 39.3 42.5  MCV  --  94.4 93.8 93.2  PLT  --  187 171 185   Cardiac Enzymes: No results  for input(s): CKTOTAL, CKMB, CKMBINDEX, TROPONINI in the last 168 hours. BNP: Invalid input(s): POCBNP CBG: Recent Labs  Lab 11/07/23 2023 11/08/23 0151 11/08/23 0747  GLUCAP 79 129* 127*   D-Dimer No results for input(s): DDIMER in the last 72 hours. Hgb A1c No results for input(s): HGBA1C in the last 72 hours. Lipid Profile No results for input(s): CHOL, HDL, LDLCALC, TRIG, CHOLHDL, LDLDIRECT in the last 72 hours. Thyroid  function studies No results for input(s): TSH, T4TOTAL, T3FREE, THYROIDAB in the last 72 hours.  Invalid input(s): FREET3 Anemia work up No results for input(s): VITAMINB12, FOLATE, FERRITIN, TIBC, IRON, RETICCTPCT in the last 72 hours. Urinalysis    Component Value Date/Time   COLORURINE YELLOW 06/27/2022 0952   APPEARANCEUR Clear 02/07/2023 1138   LABSPEC 1.009 06/27/2022 0952   PHURINE 6.0 06/27/2022 0952   GLUCOSEU Negative 02/07/2023 1138   HGBUR NEGATIVE 06/27/2022 0952   BILIRUBINUR Negative 02/07/2023 1138   KETONESUR NEGATIVE 06/27/2022 0952   PROTEINUR Negative 02/07/2023 1138   PROTEINUR NEGATIVE 06/27/2022 0952   UROBILINOGEN 0.2 09/03/2020 1523   UROBILINOGEN 0.2 04/04/2011 2241   NITRITE Negative 02/07/2023 1138   NITRITE NEGATIVE 06/27/2022 0952   LEUKOCYTESUR Negative 02/07/2023 1138   LEUKOCYTESUR NEGATIVE 06/27/2022 0952   Sepsis Labs Recent Labs  Lab 11/07/23 1948 11/08/23 0003 11/08/23 0448  WBC 9.3 10.4 10.3   Microbiology No results found for this or any previous visit (from the past 240 hours).  Procedures/Studies: VAS US  LOWER EXTREMITY VENOUS (DVT) Result Date: 11/08/2023  Lower Venous DVT Study Patient Name:  TEOFILO LUPINACCI  Date of Exam:   11/08/2023 Medical Rec #: [id number]            Accession #:    [id number] Date of Birth: 11-17-1950            Patient Gender: M Patient Age:   73 years Exam Location:  [facility] Procedure:      VAS US  LOWER EXTREMITY  VENOUS (DVT) Referring Phys: REDIA CLEAVER --------------------------------------------------------------------------------  Indications: Swelling.  Risk Factors: DVT, seizures. Anticoagulation: Lovenox. Comparison Study: 05/12/22 - Negative DVT Performing Technologist: Ricka Sturdivant-Jones RDMS, RVT  Examination Guidelines: A complete evaluation includes B-mode imaging, spectral Doppler, color Doppler, and power Doppler as needed of all accessible portions of each vessel. Bilateral testing is considered an integral part of a complete examination. Limited examinations for reoccurring indications may be performed as noted. The reflux portion of the exam is performed with the patient in reverse Trendelenburg.  +---------+---------------+---------+-----------+----------+--------------+ RIGHT    CompressibilityPhasicitySpontaneityPropertiesThrombus Aging +---------+---------------+---------+-----------+----------+--------------+ CFV      Full           Yes      Yes                                 +---------+---------------+---------+-----------+----------+--------------+ SFJ      Full                                                        +---------+---------------+---------+-----------+----------+--------------+  FV Prox  Full                                                        +---------+---------------+---------+-----------+----------+--------------+ FV Mid   Full           Yes      Yes                                 +---------+---------------+---------+-----------+----------+--------------+ FV DistalFull                                                        +---------+---------------+---------+-----------+----------+--------------+ PFV      Full                                                        +---------+---------------+---------+-----------+----------+--------------+ POP      Full           Yes      Yes                                  +---------+---------------+---------+-----------+----------+--------------+ PTV      Full                                                        +---------+---------------+---------+-----------+----------+--------------+ PERO     Full                                                        +---------+---------------+---------+-----------+----------+--------------+   +---------+---------------+---------+-----------+----------+--------------+ LEFT     CompressibilityPhasicitySpontaneityPropertiesThrombus Aging +---------+---------------+---------+-----------+----------+--------------+ CFV      Full           Yes      Yes                                 +---------+---------------+---------+-----------+----------+--------------+ SFJ      Full                                                        +---------+---------------+---------+-----------+----------+--------------+ FV Prox  Full                                                        +---------+---------------+---------+-----------+----------+--------------+  FV Mid   Full           Yes      Yes                                 +---------+---------------+---------+-----------+----------+--------------+ FV DistalFull                                                        +---------+---------------+---------+-----------+----------+--------------+ PFV      Full                                                        +---------+---------------+---------+-----------+----------+--------------+ POP      Full           Yes      Yes                                 +---------+---------------+---------+-----------+----------+--------------+ PTV      Full                                                        +---------+---------------+---------+-----------+----------+--------------+ PERO     Full                                                         +---------+---------------+---------+-----------+----------+--------------+     Summary: BILATERAL: - No evidence of deep vein thrombosis seen in the lower extremities, bilaterally. -No evidence of popliteal cyst, bilaterally.   *See table(s) above for measurements and observations. Electronically signed by Debby Hughett on 11/08/2023 at 10:26:20 AM.    Final    EEG adult Result Date: 11/08/2023 Shelton Arlin KIDD, MD     11/08/2023  9:37 AM Patient Name: JEEVAN KALLA MRN: [id number] Epilepsy Attending: Arlin KIDD Shelton Referring Physician/Provider: Voncile Isles, MD Date: 11/08/2023 Duration: 22.08 mins Patient history: 74 y.o. male prior history of hypertension obstructive sleep apnea meningioma status post bifrontal craniectomy history of seizures not on any antiepileptics at this time because of no seizures for a long time, presents for evaluation of episode of unresponsiveness followed by leftward gaze and right-sided weakness that has been resolving. EEG to evaluate for seizure Level of alertness: Awake, asleep AEDs during EEG study: LCM Technical aspects: This EEG study was done with scalp electrodes positioned according to the 10-20 International system of electrode placement. Electrical activity was reviewed with band pass filter of 1-70Hz , sensitivity of 7 uV/mm, display speed of 25mm/sec with a 60Hz  notched filter applied as appropriate. EEG data were recorded continuously and digitally stored.  Video monitoring was available and reviewed as appropriate. Description: The posterior dominant rhythm consists of 8 Hz activity of moderate voltage (25-35 uV) seen predominantly in posterior head  regions, symmetric and reactive to eye opening and eye closing. Sleep was characterized by vertex waves, sleep spindles (12 to 14 Hz), maximal frontocentral region. There is continuous polymorphic sharply contoured 3 to 6 Hz theta-delta slowing admixed with 12-13hz  beta activity in vertex region consistent with  breach artifact. Intermittent generalized 3-5hz  theta-delta slowing was also noted. Hyperventilation and photic stimulation were not performed.   ABNORMALITY - Breach artifact, vertex - Intermittent slow, generalized IMPRESSION: This study is suggestive of cortical dysfunction arising from vertex region consistent with underlying craniotomy. Additionally there is generalized cerebral dysfunction ( encephalopathy). No seizures or epileptiform discharges were seen throughout the recording. Arlin MALVA Krebs   MR BRAIN W WO CONTRAST Result Date: 11/08/2023 EXAM: MRI BRAIN WITH AND WITHOUT CONTRAST 11/08/2023 06:09:29 AM TECHNIQUE: Multiplanar multisequence MRI of the head/brain was performed with and without the administration of intravenous contrast. COMPARISON: Head CT yesterday. Restaging brain MRI 06/07/2021. CLINICAL HISTORY: 73 year old male with history of meningioma, previous vertex craniotomy, and parafalcine meningioma resection. Restaging brain MRI. FINDINGS: BRAIN AND VENTRICLES: No hydrocephalus. The sella is unremarkable. Normal flow voids. Previous vertex craniotomy and parafalcine meningioma resection. Previous segmental resection of the superior sagittal sinus. Other major dural venous sinuses are enhancing and appear to be patent. Chronic superior bifrontal encephalomalacia greater on the left. Nodular recurrent meningiomatosis along the resection margins including 3 fairly discrete homogeneously enhancing dural based masses. There is a left anterior parasagittal lesion measuring about 17 x 18 x 14 mm. There is a posterior left parasagittal rounded lesion measuring 13 mm in diameter (both on series 18, image 14). There is a nodular but more elongated lesion along the left lateral craniotomy site, nearly 3 cm long axis (series 16, image 52 and series 18, image 18), by 14 x 9 mm. All 3 lesions are new since the 2023 MRI. There is mild additional generalized regional dural thickening and nodular  enhancement - see series 16, image 49. No associated regional mass effect or definite cerebral edema. Postoperative and chronic T2 and FLAIR hyperintensity in the superior frontal lobes, tracking in the underlying white matter has not significantly changed. Separate chronic encephalomalacia left inferior frontal gyrus, left greater than right anterior temporal lobes. Background cerebral volume is stable since 2023. Mild thinning of the chronic brain hemosiderin on SWI. No superimposed midline shift or significant intracranial mass effect. No ventriculomegaly. No abnormal diffusion suggestive of acute infarct. ORBITS: No acute abnormality. SINUSES: No acute abnormality. BONES AND SOFT TISSUES: Normal bone marrow signal and enhancement. Chronic vertex craniotomy/cranioplasty. Stable postoperative changes to the superior scalp soft tissues. Negative for age visible cervical spine. IMPRESSION: 1. Progressive / recurrent Meningiomatosis about the vertex resection site with three distinct new dural-based enhancing masses since 2023, ranging from 1.3 cm to 3 cm long axis. And mild additional nodular dural thickening. No regional edema or significant mass effect. 2. Otherwise Stable brain with chronic combined postoperative and posttraumatic appearing multifocal encephalomalacia. Electronically signed by: Helayne Hurst MD 11/08/2023 06:29 AM EDT RP Workstation: [id number]   DG Hand 2 View Right Result Date: 11/08/2023 EXAM: 1 or 2 VIEW(S) XRAY OF THE RIGHT HAND 11/08/2023 12:20:00 AM COMPARISON: None available. CLINICAL HISTORY: [id number] Pain 144615. Pt BIB [facility] from home. Pt had onset of R side weakness and L side gaze, followed by episode of unresponsiveness. On EMS arrival became more responsive and on arrival ER pt had returned to baseline, a/ox4. ; Pt has hx of brain tumor removal and seizures. Pt BIB [facility]  from home. Pt had onset of R side weakness and L side gaze, followed by episode of  unresponsiveness. On EMS arrival became more responsive and on arrival ER pt had returned to baseline, a/ox4. ; Pt has hx of brain tumor removal and seizures. FINDINGS: BONES AND JOINTS: Demineralization. Scattered degenerative arthritis about the IP joints and 1st [facility] joint. No acute fracture. No focal osseous lesion. No joint dislocation. SOFT TISSUES: Pulse oximeter obscures the distal 4th finger. Diffuse swelling about the hand. IMPRESSION: 1. No acute osseous abnormality. 2. Diffuse swelling about the hand. Electronically signed by: Norman Gatlin MD 11/08/2023 12:42 AM EDT RP Workstation: [id number]   DG Wrist 2 Views Right Result Date: 11/08/2023 EXAM: 1 VIEW(S) XRAY OF THE RIGHT WRIST 11/08/2023 12:20:00 AM COMPARISON: None available. CLINICAL HISTORY: [id number] Pain 144615. Pt BIB [facility] from home. Pt had onset of R side weakness and L side gaze, followed by episode of unresponsiveness. On EMS arrival became more responsive and on arrival ER pt had returned to baseline, a/ox4. ; Pt has hx of brain tumor removal and seizures. FINDINGS: BONES AND JOINTS: Demineralization. No acute fracture. No focal osseous lesion. No joint dislocation. Degenerative changes at the 1st [facility] and radiocarpal joints. SOFT TISSUES: The soft tissues are unremarkable. IMPRESSION: 1. No acute osseous abnormality. Electronically signed by: Norman Gatlin MD 11/08/2023 12:41 AM EDT RP Workstation: [id number]   CT HEAD CODE STROKE WO CONTRAST Result Date: 11/07/2023 EXAM: CT HEAD WITHOUT CONTRAST 11/07/2023 07:50:20 PM TECHNIQUE: CT of the head was performed without the administration of intravenous contrast. Automated exposure control, iterative reconstruction, and/or weight based adjustment of the mA/kV was utilized to reduce the radiation dose to as low as reasonably achievable. COMPARISON: 08/12/2020 CLINICAL HISTORY: Neuro deficit, acute, stroke suspected. FINDINGS: BRAIN AND VENTRICLES: No acute hemorrhage. No evidence  of acute infarct. No hydrocephalus. No extra-axial collection. No mass effect or midline shift. There is encephalomalacia of the inferior left frontal lobe and the bilateral superior frontoparietal regions junctions. ASPECTS is 10. ORBITS: No acute abnormality. SINUSES: No acute abnormality. SOFT TISSUES AND SKULL: No acute soft tissue abnormality. No skull fracture. Remote vertex craniotomy with cranioplasty mesh. IMPRESSION: 1. No acute intracranial abnormality. ASPECTS 10. 2. Encephalomalacia involving the inferior left frontal lobe and bilateral superior frontoparietal junctions 3. Findings communicated to Dr. Ashish Arora at 7:59 pm on 11/07/2023. Electronically signed by: Franky Stanford MD 11/07/2023 07:59 PM EDT RP Workstation: [id number]     Time coordinating discharge: Over 30 minutes    Alm Apo, MD  [facility] 11/08/2023, 5:12 PM

## 2023-11-08 NOTE — Progress Notes (Signed)
 Venous duplex lower ext  has been completed. Refer to [facility] under chart review to view preliminary results.   11/08/2023  9:35 AM Anthony Downs, Anthony Downs

## 2023-11-08 NOTE — ED Notes (Signed)
 PT/OT at bedside.

## 2023-11-08 NOTE — Discharge Instructions (Signed)
Seizure precautions: °Per [place] DMV statutes, patients with seizures are not allowed to drive until they have been seizure-free for six months and cleared by a physician  °  °Use caution when using heavy equipment or power tools. Avoid working on ladders or at heights. Take showers instead of baths. Ensure the water temperature is not too high on the home water heater. Do not go swimming alone. Do not lock yourself in a room alone (i.e. bathroom). When caring for infants or small children, sit down when holding, feeding, or changing them to minimize risk of injury to the child in the event you have a seizure. Maintain good sleep hygiene. Avoid alcohol.  °  °If patient has another seizure, call 911 and bring them back to the ED if: °A.  The seizure lasts longer than 5 minutes.      °B.  The patient doesn't wake shortly after the seizure or has new problems such as difficulty seeing, speaking or moving following the seizure °C.  The patient was injured during the seizure °D.  The patient has a temperature over 102 F (39C) °E.  The patient vomited during the seizure and now is having trouble breathing °   °During the Seizure °  °- First, ensure adequate ventilation and place patients on the floor on their left side  °Loosen clothing around the neck and ensure the airway is patent. If the patient is clenching the teeth, do not force the mouth open with any object as this can cause severe damage °- Remove all items from the surrounding that can be hazardous. The patient may be oblivious to what's happening and may not even know what he or she is doing. °If the patient is confused and wandering, either gently guide him/her away and block access to outside areas °- Reassure the individual and be comforting °- Call 911. In most cases, the seizure ends before EMS arrives. However, there are cases when seizures may last over 3 to 5 minutes. Or the individual may have developed breathing difficulties or severe  injuries. If a pregnant patient or a person with diabetes develops a seizure, it is prudent to call an ambulance. °- Finally, if the patient does not regain full consciousness, then call EMS. Most patients will remain confused for about 45 to 90 minutes after a seizure, so you must use judgment in calling for help. °- Avoid restraints but make sure the patient is in a bed with padded side rails °- Place the individual in a lateral position with the neck slightly flexed; this will help the saliva drain from the mouth and prevent the tongue from falling backward °- Remove all nearby furniture and other hazards from the area °- Provide verbal assurance as the individual is regaining consciousness °- Provide the patient with privacy if possible °- Call for help and start treatment as ordered by the caregiver °  ° After the Seizure (Postictal Stage) °  °After a seizure, most patients experience confusion, fatigue, muscle pain and/or a headache. Thus, one should permit the individual to sleep. For the next few days, reassurance is essential. Being calm and helping reorient the person is also of importance. °  °Most seizures are painless and end spontaneously. Seizures are not harmful to others but can lead to complications such as stress on the lungs, brain and the heart. Individuals with prior lung problems may develop labored breathing and respiratory distress.  °  °

## 2023-11-08 NOTE — Evaluation (Signed)
 Physical Therapy Evaluation Patient Details Name: Anthony Downs MRN: [id number] DOB: [birth year] Today's Date: 11/08/2023  History of Present Illness  ANTRELL TIPLER is a 73 y.o. male presented to ED post an unresponsive episode then tonic clonic jerks, left sided gaze, aphasic. R wrist pain (x-ray negative). 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 PHMx: meningioma status post bifrontal craniectomy in April 2022 with history of seizures presently not on seizure medication which was discontinued in February 2025, hypertension, diabetes mellitus type 2, hyperlipidemia, BPH, arthritis, sleep apnea, chronic right sided weakness.  Clinical Impression  Pt presents to PT with deficits in R sided strength, power, functional mobility, gait, balance. Pt has chronic R hemiplegia and reports strength is currently not far from baseline. Pt does report R hand pain, limiting use during bed mobility on evaluation. Pt's spouse is his primary caregiver and able to assist the pt in bed mobility during this evaluation. Pt and spouse prefer to continue outpatient PT at [facility] as the pt has good rapport with his therapy team there. Acute PT will follow for the remainder of this admission.        If plan is discharge home, recommend the following: A little help with walking and/or transfers;A lot of help with bathing/dressing/bathroom;Assistance with cooking/housework;Assist for transportation;Help with stairs or ramp for entrance   Can travel by private vehicle        Equipment Recommendations None recommended by PT  Recommendations for Other Services       Functional Status Assessment Patient has had a recent decline in their functional status and demonstrates the ability to make significant improvements in function in a reasonable and predictable amount of time.     Precautions / Restrictions Precautions Precautions: Fall Recall of Precautions/Restrictions:  Intact Precaution/Restrictions Comments: seizure Restrictions Weight Bearing Restrictions Per Provider Order: No      Mobility  Bed Mobility Overal bed mobility: Needs Assistance Bed Mobility: Supine to Sit, Sit to Supine     Supine to sit: Mod assist, HOB elevated Sit to supine: Mod assist, HOB elevated   General bed mobility comments: spouse assisting pt in sit to supine    Transfers Overall transfer level: Needs assistance Equipment used: Rolling walker (2 wheels) (bariatric RW) Transfers: Sit to/from Stand Sit to Stand: Contact guard assist, From elevated surface                Ambulation/Gait Ambulation/Gait assistance: Contact guard assist Gait Distance (Feet): 20 Feet Assistive device: Rolling walker (2 wheels) (bariatric RW) Gait Pattern/deviations: Step-to pattern, Decreased dorsiflexion - right Gait velocity: reduced Gait velocity interpretation: <1.31 ft/sec, indicative of household ambulator   General Gait Details: pt with slowed step-to gait, shuffling steps with RLE initially however foot clearance and step length improve with increased ambulation distance  Stairs            Wheelchair Mobility     Tilt Bed    Modified Rankin (Stroke Patients Only)       Balance Overall balance assessment: Needs assistance Sitting-balance support: No upper extremity supported, Feet supported Sitting balance-Leahy Scale: Fair     Standing balance support: Bilateral upper extremity supported, Reliant on assistive device for balance Standing balance-Leahy Scale: Poor                               Pertinent Vitals/Pain Pain Assessment Pain Assessment: Faces Faces Pain Scale: Hurts little more Pain Location: R hand  Pain Descriptors / Indicators: Sore Pain Intervention(s): Monitored during session    Home Living Family/patient expects to be discharged to:: Private residence Living Arrangements: Spouse/significant other Available Help  at Discharge: Family;Available 24 hours/day Type of Home: House Home Access: Ramped entrance       Home Layout: One level Home Equipment: Other (comment);Wheelchair - power;Hospital bed (trapeze, sara STEDY, bariatric RW)      Prior Function Prior Level of Function : Needs assist             Mobility Comments: pt requires assistance for bed mobility and to transfer from lower surfaces ADLs Comments: assistance for dressing, bathing, and IADLs     Extremity/Trunk Assessment   Upper Extremity Assessment Upper Extremity Assessment: RUE deficits/detail RUE Deficits / Details: active shoulder flexion limited to ~45 degrees against gravity, PROM to ~120 degrees. Elbow ROM WFL, pt with chronic loss of 2nd digit flexion.    Lower Extremity Assessment Lower Extremity Assessment: RLE deficits/detail RLE Deficits / Details: ROM WFL, at least 3+/5 based on functional mobility assessment    Cervical / Trunk Assessment Cervical / Trunk Assessment: Other exceptions Cervical / Trunk Exceptions: body habitus  Communication   Communication Communication: No apparent difficulties    Cognition Arousal: Alert Behavior During Therapy: WFL for tasks assessed/performed   PT - Cognitive impairments: No apparent impairments                         Following commands: Intact       Cueing Cueing Techniques: Verbal cues     General Comments General comments (skin integrity, edema, etc.): VSS on RA    Exercises     Assessment/Plan    PT Assessment Patient needs continued PT services  PT Problem List Decreased strength;Decreased activity tolerance;Decreased balance;Decreased mobility;Decreased knowledge of use of DME;Pain       PT Treatment Interventions DME instruction;Gait training;Stair training;Functional mobility training;Therapeutic activities;Therapeutic exercise;Balance training;Neuromuscular re-education;Cognitive remediation;Patient/family education    PT  Goals (Current goals can be found in the Care Plan section)  Acute Rehab PT Goals Patient Stated Goal: to return to ambulating within the home without assistance PT Goal Formulation: With patient/family Time For Goal Achievement: 11/22/23 Potential to Achieve Goals: Fair    Frequency Min 2X/week     Co-evaluation               AM-PAC PT 6 Clicks Mobility  Outcome Measure Help needed turning from your back to your side while in a flat bed without using bedrails?: A Lot Help needed moving from lying on your back to sitting on the side of a flat bed without using bedrails?: A Lot Help needed moving to and from a bed to a chair (including a wheelchair)?: A Little Help needed standing up from a chair using your arms (e.g., wheelchair or bedside chair)?: A Little Help needed to walk in hospital room?: A Little Help needed climbing 3-5 steps with a railing? : Total 6 Click Score: 14    End of Session Equipment Utilized During Treatment: Gait belt Activity Tolerance: Patient tolerated treatment well Patient left: in bed;with call bell/phone within reach;with family/visitor present Nurse Communication: Mobility status PT Visit Diagnosis: Other abnormalities of gait and mobility (R26.89);Muscle weakness (generalized) (M62.81);Other symptoms and signs involving the nervous system (R29.898)    Time: 1006-1050 PT Time Calculation (min) (ACUTE ONLY): 44 min   Charges:   PT Evaluation $PT Eval Moderate Complexity: 1 Mod   PT  General Charges $$ ACUTE PT VISIT: 1 Visit         Bernardino JINNY Ruth, PT, DPT Acute Rehabilitation Office [phone]   Bernardino JINNY Ruth 11/08/2023, 12:32 PM

## 2023-11-08 NOTE — Procedures (Signed)
 Patient Name: Anthony Downs  MRN: [id number]  Epilepsy Attending: Arlin MALVA Krebs  Referring Physician/Provider: Voncile Isles, MD  Date: 11/08/2023 Duration: 22.08 mins  Patient history: 73 y.o. male prior history of hypertension obstructive sleep apnea meningioma status post bifrontal craniectomy history of seizures not on any antiepileptics at this time because of no seizures for a long time, presents for evaluation of episode of unresponsiveness followed by leftward gaze and right-sided weakness that has been resolving. EEG to evaluate for seizure  Level of alertness: Awake, asleep  AEDs during EEG study: LCM  Technical aspects: This EEG study was done with scalp electrodes positioned according to the 10-20 International system of electrode placement. Electrical activity was reviewed with band pass filter of 1-70Hz , sensitivity of 7 uV/mm, display speed of 76mm/sec with a 60Hz  notched filter applied as appropriate. EEG data were recorded continuously and digitally stored.  Video monitoring was available and reviewed as appropriate.  Description: The posterior dominant rhythm consists of 8 Hz activity of moderate voltage (25-35 uV) seen predominantly in posterior head regions, symmetric and reactive to eye opening and eye closing. Sleep was characterized by vertex waves, sleep spindles (12 to 14 Hz), maximal frontocentral region. There is continuous polymorphic sharply contoured 3 to 6 Hz theta-delta slowing admixed with 12-13hz  beta activity in vertex region consistent with breach artifact. Intermittent generalized 3-5hz  theta-delta slowing was also noted. Hyperventilation and photic stimulation were not performed.     ABNORMALITY - Breach artifact, vertex - Intermittent slow, generalized  IMPRESSION: This study is suggestive of cortical dysfunction arising from vertex region consistent with underlying craniotomy. Additionally there is generalized cerebral dysfunction (  encephalopathy). No seizures or epileptiform discharges were seen throughout the recording.  Daniela Siebers O Zayah Keilman

## 2023-11-08 NOTE — Progress Notes (Signed)
 Patient not available for EEG due to going to MRI, Day team will try back as schedule allows.

## 2023-11-08 NOTE — Evaluation (Signed)
 Occupational Therapy Evaluation and Discharge Patient Details Name: Anthony Downs MRN: [id number] DOB: 24-May-1950 Today's Date: 11/08/2023   History of Present Illness   Anthony Downs is a 73 y.o. male presented to ED post an unresponsive episode then tonic clonic jerks, left sided gaze, aphasic. R wrist pain (x-ray negative). 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 PHMx: meningioma status post bifrontal craniectomy in April 2022 with history of seizures presently not on seizure medication which was discontinued in February 2025, hypertension, diabetes mellitus type 2, hyperlipidemia, BPH, arthritis, sleep apnea, chronic right sided weakness.     Clinical Impressions This 73 yo male admitted with above presents to acute OT with close to his baseline level of functioning per wife and patient in room. The biggest issue is decreased AROM in right wrist due to pain causing him to have to modify his use of RW for ambulation, bed mobility, and ADLs. Right wrist splint ordered after MD gave me verbal order via secure chat. Pt to wear splint as tolerated. No further OT needs, we will sign off.     If plan is discharge home, recommend the following:   A little help with walking and/or transfers;A lot of help with bathing/dressing/bathroom;Assistance with cooking/housework;Help with stairs or ramp for entrance;Assist for transportation     Functional Status Assessment   Patient has had a recent decline in their functional status and demonstrates the ability to make significant improvements in function in a reasonable and predictable amount of time. (with ability of wife and pt to still manage at home)     Equipment Recommendations   None recommended by OT      Precautions/Restrictions   Precautions Precautions: Fall Recall of Precautions/Restrictions: Intact Precaution/Restrictions Comments: seizure Restrictions Weight Bearing Restrictions Per Provider Order:  No            ADL either performed or assessed with clinical judgement   ADL Overall ADL's : Needs assistance/impaired Eating/Feeding: Independent;Sitting   Grooming: Set up;Sitting   Upper Body Bathing: Set up;Sitting   Lower Body Bathing: Moderate assistance Lower Body Bathing Details (indicate cue type and reason): CGA per wife Upper Body Dressing : Set up;Sitting     Lower Body Dressing Details (indicate cue type and reason): CGA per wife   Toilet Transfer Details (indicate cue type and reason): Wife reports they use sara stedy for toileting transfers Toileting- Architect and Hygiene: Total assistance Toileting - Clothing Manipulation Details (indicate cue type and reason): CGA per wife with sara stedy       General ADL Comments: In speaking with pt and wife they feel he is really close to baseline for his bed mobility and ambulation with PT eariler and that the only thing this is bothering him is his RUE (making it more difficult to hold walker and to bear weight which makes pt expend more energy with ambulation than is his normal. Wife is with him 24/7. Pt not very active at home per his and wife's report so did not push him to get up again with me so soon after finishing with PT and pt/wife feeling his is close to his baseline.     Vision Patient Visual Report: No change from baseline              Pertinent Vitals/Pain Pain Assessment Pain Assessment: Faces Faces Pain Scale: Hurts little more Pain Location: R hand Pain Descriptors / Indicators: Sore Pain Intervention(s): Limited activity within patient's tolerance, Repositioned (placed order for right wrist  splint)     Extremity/Trunk Assessment Upper Extremity Assessment Upper Extremity Assessment: Right hand dominant RUE Deficits / Details: active shoulder flexion limited to ~45 degrees against gravity, PROM to ~120 degrees. Elbow ROM WFL, pt with chronic loss of 2nd digit flexion.       Communication Communication Communication: No apparent difficulties   Cognition Arousal: Alert Behavior During Therapy: WFL for tasks assessed/performed                                 Following commands: Intact       Cueing  General Comments   Cueing Techniques: Verbal cues  VSS on RA           Home Living Family/patient expects to be discharged to:: Private residence Living Arrangements: Spouse/significant other Available Help at Discharge: Family;Available 24 hours/day Type of Home: House Home Access: Ramped entrance     Home Layout: One level     Bathroom Shower/Tub: Producer, television/film/video: Handicapped height Bathroom Accessibility: Yes   Home Equipment: Other (comment);Wheelchair - power;Hospital bed (trapeze, sara STEDY, bariatric RW)          Prior Functioning/Environment Prior Level of Function : Needs assist             Mobility Comments: pt requires assistance for bed mobility and to transfer from lower surfaces ADLs Comments: assistance for dressing, bathing, and IADLs    OT Problem List: Decreased strength;Decreased range of motion;Impaired balance (sitting and/or standing);Pain        OT Goals(Current goals can be found in the care plan section)   Acute Rehab OT Goals Patient Stated Goal: to go home and right wrist to feel better soon         AM-PAC OT 6 Clicks Daily Activity     Outcome Measure Help from another person eating meals?: A Little Help from another person taking care of personal grooming?: A Little Help from another person toileting, which includes using toliet, bedpan, or urinal?: A Lot Help from another person bathing (including washing, rinsing, drying)?: A Lot Help from another person to put on and taking off regular upper body clothing?: A Little Help from another person to put on and taking off regular lower body clothing?: A Lot 6 Click Score: 15   End of Session Nurse  Communication:  (ordered right wrist splint and called ortho tech for it.)  Activity Tolerance: Patient tolerated treatment well Patient left: in bed;with family/visitor present  OT Visit Diagnosis: Muscle weakness (generalized) (M62.81);Hemiplegia and hemiparesis;Pain Hemiplegia - Right/Left: Right Hemiplegia - dominant/non-dominant: Dominant Pain - Right/Left: Right Pain - part of body:  (wrist)                Time: 8898-8877 OT Time Calculation (min): 21 min Charges:  OT General Charges $OT Visit: 1 Visit OT Evaluation $OT Eval Low Complexity: 1 Low  Donny BECKER OT Acute Rehabilitation Services Office [phone]    Rodgers Dorothyann Distel 11/08/2023, 2:04 PM

## 2023-11-09 NOTE — Telephone Encounter (Signed)
 Call to wife who reports a grand mal seizure on 10/20. He did bite his tongue and lost control of his bladder. The MRI at this recent showed multiple new meningioma according to wife. She isn't sure if she wants to go back to [place] [facility] or to [facility] and will let us  know. She states he was started on Vimpat and wants to make sure Dr. Gregg agrees and will refill until next visit.  Advised I would send to Dr. Gregg to review. Wife appreciative and fine with my chart response if needed.

## 2023-11-09 NOTE — Telephone Encounter (Signed)
 Dr. Gregg- Pt sent this in mychart as well:

## 2023-11-10 ENCOUNTER — Ambulatory Visit: Payer: Self-pay | Admitting: Physical Therapy

## 2023-11-10 NOTE — Telephone Encounter (Signed)
 Ok to continue with Vimpat. His Doctor prescribed him Primidone  back in August, please inquire when and why they stop it.

## 2023-11-11 LAB — CBG MONITORING, ED: Glucose-Capillary: 123 mg/dL — ABNORMAL HIGH (ref 70–99)

## 2023-11-15 ENCOUNTER — Ambulatory Visit: Payer: Self-pay | Admitting: Physical Therapy

## 2023-11-15 NOTE — Telephone Encounter (Signed)
 Just wanted to make sure, this patient is in the cancellation list to be seen sooner. Thanks

## 2023-11-16 ENCOUNTER — Other Ambulatory Visit (HOSPITAL_BASED_OUTPATIENT_CLINIC_OR_DEPARTMENT_OTHER): Payer: Self-pay

## 2023-11-17 ENCOUNTER — Ambulatory Visit (INDEPENDENT_AMBULATORY_CARE_PROVIDER_SITE_OTHER): Admitting: Neurology

## 2023-11-17 ENCOUNTER — Other Ambulatory Visit (HOSPITAL_BASED_OUTPATIENT_CLINIC_OR_DEPARTMENT_OTHER): Payer: Self-pay

## 2023-11-17 ENCOUNTER — Encounter: Payer: Self-pay | Admitting: Neurology

## 2023-11-17 VITALS — BP 132/80 | HR 65 | Ht 73.0 in | Wt 330.0 lb

## 2023-11-17 DIAGNOSIS — R569 Unspecified convulsions: Secondary | ICD-10-CM | POA: Diagnosis not present

## 2023-11-17 DIAGNOSIS — G250 Essential tremor: Secondary | ICD-10-CM | POA: Diagnosis not present

## 2023-11-17 DIAGNOSIS — Z86018 Personal history of other benign neoplasm: Secondary | ICD-10-CM

## 2023-11-17 DIAGNOSIS — Z9889 Other specified postprocedural states: Secondary | ICD-10-CM

## 2023-11-17 DIAGNOSIS — D320 Benign neoplasm of cerebral meninges: Secondary | ICD-10-CM

## 2023-11-17 MED ORDER — LACOSAMIDE 50 MG PO TABS
50.0000 mg | ORAL_TABLET | Freq: Two times a day (BID) | ORAL | 3 refills | Status: AC
  Filled 2023-11-17: qty 180, 90d supply, fill #0
  Filled 2024-02-23: qty 60, 30d supply, fill #0

## 2023-11-17 NOTE — Progress Notes (Signed)
 [facility]  PATIENT: Anthony Downs DOB: [birth year]  REFERRING CLINICIAN: Bertell Satterfield, MD HISTORY FROM: Spouse [name]  REASON FOR VISIT: Seizure s/p meningioma resection    HISTORICAL  CHIEF COMPLAINT:  Chief Complaint  Patient presents with   RM 12     Patient is here with his wife for a seizure follow-up - was able to follow up with a neuro surgeon from [facility] 12/08/23 will gets 3 tumors removed ( smaller than original tumor)    INTERVAL HISTORY 11/17/2023 Anthony Downs presents today for follow-up, he is accompanied by wife.  Last visit was in February, at that time he was doing well, no seizures while being off Depakote  but unfortunately on October 20 he did have a breakthrough seizure at home witnessed by wife.  He did hurt his right hand during the seizure.  He was taken to the hospital and during the workup was found to have 3 new meningiomas.  He did follow-up with [facility] and is scheduled for meningioma resection on November 20. Prior to the seizure he was doing well, was able to walk up to 180 feet but since recent seizure and right hand injury his mobility is limited.  While in the hospital, he was loaded on Vimpat and currently on Vimpat 50 mg twice daily, report compliance and denies any side effect from the medication.   INTERVAL HISTORY 03/09/2023:  Patient was called today, wife at his side.  Last visit was a year ago.  Since then, he has been doing well, denies any seizure or seizure like activity.  He discontinued Depakote  about 2 months ago, has not had any seizure.  Still having tremors on propranolol  20 mg daily.  Still doing the physical therapy, reports some improvement in his transfer.  No other complaints, no concerns at the moment.   INTERVAL HISTORY 03/04/2022:  Patient presents today for follow-up, last visit was a year ago.  Since then he has not had any seizure or seizure-like activity.  He remains on the Depakote  500 mg daily.   Reports tremor are better with propranolol  20 mg daily.  He is still undergoing physical therapy.  Wife reports improvement in this strength.   INTERVAL HISTORY 02/26/2021:  Patient presents for follow-up with wife [name].  At last visit in October he did not have any seizures and plan was to decrease the Seroquel  because he was getting still sleepy during the daytime.  Since then no additional seizures, wife discontinued Seroquel , discontinued the Atarax  and Ritalin , no additional sundowning effects.  Right now he is only taking 1 trazodone  at nighttime.  He is more alert more interactive and is setting to restart physical therapy soon, he is making progress with his RLE.  No seizures since last visit.  Currently he does not have any complaints.     INTERVAL HISTORY 11/06/2020 Mr. Anthony Downs presents today for follow-up, he presents today with his sister as his wife [name] has a doctor's appointment.  At last visit on August 15 plan was to discontinue the Keppra  and switch him to Depakote .  Per wife he was very somnolent initially, his Depakote  was checked and was 106 at that time advised her to decrease Depakote  to 500 mg twice daily.  He was also started on Seroquel  for delirium and titrated to 50 mg nightly.  Per wife who contacted me via MyChart, she expressed that Anthony Downs is still somnolent during the daytime, sometimes has difficulty performing physical therapy and she is worried that  he will discharged from physical therapy.  He has not had any seizures since last visit.  His sleep is better but still required the Seroquel  at night.   HISTORY OF PRESENT ILLNESS:  This is a 73 year old male with past medical history of essential hypertension, essential tremor, meningioma status post removal in April 2022 who is presenting with new onset seizure. In brief, wife reports that patient was normal prior to his meningioma resection, no memory deficits, no behavioral problems and no seizure.  He did have  minimal right upper extremity and right lower extremity weakness as he was holding his right arm in close to his body and also dragging his right foot. Because of this, his PMD ordered a MRI brain which showed a meningioma left greater than right with surrounding vasogenic edema in the left posterior frontal lobe which was resected in April 2022.  Per chart review his neurosurgery note reports postop MRI showed a gross total resection except for some possible residual tumor in the patent portion of the sinus.  The tumor was very difficult to dissected free and appeared to be invasive.  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  His aphasia improved, strength slowly improved.  He was seen by PT/OT\SLP who recommended rehab placement.  He was in rehab for a total of Meningioma removed in April, rehab for 51 days then home. Wife reported since leaving rehab he is having delirium, sundowning syndrome.  He is on Seroquel  50 mg nightly and trazodone  100 mg nightly but he is not sleeping during the night. In the contrary, he sleeps during the day, per wife it is very difficult to keep him up during the day.   Wife reports on July 26 she heard the bridge the bed shake, when she went to check on him his whole body was shaking, head turned to the right, eyes rolled back.  She reported episode lasted more than 2 minutes because she was still shaking while she was on the phone with EMS.  After the event, per wife he look like he was passed out.  He was initially taken to [facility] but later transferred to [facility] where he had a EEG that showed left greater than right slowing but no seizures.  He was also found to have a UTI.  He was started on levetiracetam  500 mg twice daily and since leaving the hospital wife denies any additional seizure or seizure-like event.  Since the seizure was reported memory is worse, with  less on the right side is worse, and he is more irritable.  Sleep is worse as is not sleeping during the night.   Handedness: Right handed    Seizure Type: Generalized convulsion   Current frequency: only one   Any injuries from seizures: None  Seizure risk factors: Meningioma s/p resection   Previous ASMs: Levetiracetam , valproic acid   Currenty ASMs: Lacosamide 50 mg twice daily  ASMs side effects: Memory, sleep, irritability with Levetiracetam , Sleep apnea   Brain Images 7/28:  No evidence of residual or recurrent meningioma tumor. Vertex craniectomy and cranioplasty for resection of meningioma. Segmental resection of the superior sagittal sinus. No residual or recurrent tumor seen in the region.  Previous EEGs: 7/27: This study is suggestive of cortical dysfunction arising from left frontocentral region suggestive of underlying structural abnormality. There is also mild diffuse encephalopathy, nonspecific etiology. No seizures or definite epileptiform discharges were seen  throughout the recording.   OTHER MEDICAL CONDITIONS: HTN, Essential tremors   REVIEW OF SYSTEMS: Unable to fully obtain ROS due to patient mental status   ALLERGIES: Allergies  Allergen Reactions   Sinemet  [Carbidopa -Levodopa ] Other (See Comments)    sedation   Ativan  [Lorazepam ] Other (See Comments)    Worsening confusion   Haldol  [Haloperidol ] Other (See Comments)    Worsening confusion    HOME MEDICATIONS: Outpatient Medications Prior to Visit  Medication Sig Dispense Refill   albuterol  (VENTOLIN  HFA) 108 (90 Base) MCG/ACT inhaler Inhale 2 puffs into the lungs every 6 (six) hours as needed for wheezing or shortness of breath.     aspirin  EC 81 MG tablet Take 81 mg by mouth at bedtime. Swallow whole.     atorvastatin  (LIPITOR) 10 MG tablet Take 1 tablet (10 mg total) by mouth at bedtime. 90 tablet 3   b complex vitamins capsule Take 1 capsule by mouth daily.     cyanocobalamin  (VITAMIN B12) 1000  MCG tablet Take 1,000 mcg by mouth daily.     empagliflozin  (JARDIANCE ) 25 MG TABS tablet Take 1 tablet (25 mg total) by mouth daily. 30 tablet 11   finasteride  (PROSCAR ) 5 MG tablet Take 1 tablet (5 mg total) by mouth daily. 90 tablet 3   furosemide  (LASIX ) 20 MG tablet Take 1 tablet (20 mg total) by mouth daily.     glimepiride  (AMARYL ) 4 MG tablet Take 2 tablets (8 mg total) by mouth daily.     glucose blood test strip Use to test blood sugar daily. 100 each 3   magnesium  oxide (MAG-OX) 400 (240 Mg) MG tablet Take 1 tablet (400 mg total) by mouth daily. (Patient taking differently: Take 400 mg by mouth at bedtime.) 90 tablet 3   pantoprazole  (PROTONIX ) 40 MG tablet Take 1 tablet (40 mg total) by mouth daily. 30 tablet 0   potassium chloride  SA (KLOR-CON  M) 20 MEQ tablet Take 1 tablet (20 mEq total) by mouth daily.     propranolol  (INDERAL ) 20 MG tablet Take 1 tablet (20 mg total) by mouth 2 (two) times daily.     Propylene Glycol-Glycerin (SOOTHE OP) Apply 1 drop to eye daily as needed (dry eyes).     tamsulosin  (FLOMAX ) 0.4 MG CAPS capsule Take 1 capsule (0.4 mg total) by mouth in the morning and at bedtime. 180 capsule 3   VITAMIN D PO Take 1 tablet by mouth daily in the afternoon.     Vitamin D, Ergocalciferol, (DRISDOL) 1.25 MG (50000 UNIT) CAPS capsule Take 50,000 Units by mouth once a week.     lacosamide (VIMPAT) 50 MG TABS tablet Take 1 tablet (50 mg total) by mouth 2 (two) times daily. 180 tablet 2   Cholecalciferol (VITAMIN D3 MAXIMUM STRENGTH) 125 MCG (5000 UT) capsule Take 5,000 Units by mouth daily. (Patient not taking: Reported on 11/17/2023)     zinc  gluconate 50 MG tablet Take 50 mg by mouth daily. (Patient not taking: Reported on 11/17/2023)     No facility-administered medications prior to visit.    PAST MEDICAL HISTORY: Past Medical History:  Diagnosis Date   Acid reflux    Anxiety    Arthritis    Asthma    as a child   Basal cell carcinoma 01/26/2021   RIGHT POST  SURICULAR INFERIOR   Basal cell carcinoma 01/26/2021   RIGHT POST AURICULAR SUPERIOR   Benign brain tumor (HCC)    Chest pain    a. 09/2019 MV:  No ischemia.   Complication of anesthesia    slow to wake up and pain medicines make him sick   GERD (gastroesophageal reflux disease)    History of DVT (deep vein thrombosis)    History of echocardiogram    a. 09/2019 Echo: EF 60-65%, nl RV fxn; b. 09/2022 Echo: EF of 60-65% without regional wall motion abnormalities, normal RV size/function, and borderline dilatation of the aortic root at 39 mm.   Hypercholesteremia    Hypertension    PONV (postoperative nausea and vomiting)    PVC's (premature ventricular contractions)    a. 08/2022 [facility]: predominantly sinus rhythm with an average heart rate of 71 (50-150), 5 brief SVT runs (longest 10.2 seconds with max rate of 150).  3.2% PVC burden was also noted.  No triggered events.   Skin cancer    skin cancer - basal cell on head, squamous behind ear   Sleep apnea    no Cpap   Subdural hematoma, post-traumatic (HCC) 2008   fell off truck hit head on concrete    PAST SURGICAL HISTORY: Past Surgical History:  Procedure Laterality Date   APPLICATION OF CRANIAL NAVIGATION Left 04/29/2020   Procedure: APPLICATION OF CRANIAL NAVIGATION;  Surgeon: Cheryle Debby LABOR, MD;  Location: [facility] OR;  Service: Neurosurgery;  Laterality: Left;   arthroscopic knee     COLONOSCOPY N/A 11/29/2018   Procedure: COLONOSCOPY;  Surgeon: Golda Claudis PENNER, MD;  Location: [facility]ITE;  Service: Endoscopy;  Laterality: N/A;  730-rescheduled 9/2 same time per Ann   CRANIOTOMY Left 04/29/2020   Procedure: LEFT CRANIECTOMY WITH TUMOR EXCISION;  Surgeon: Cheryle Debby LABOR, MD;  Location: [facility] OR;  Service: Neurosurgery;  Laterality: Left;   KNEE SURGERY Right    NASAL SEPTOPLASTY W/ TURBINOPLASTY Bilateral 03/19/2020   Procedure: NASAL SEPTOPLASTY WITH BILATERAL  TURBINATE REDUCTION;  Surgeon: Karis Clunes, MD;  Location: [facility] OR;   Service: ENT;  Laterality: Bilateral;   VASECTOMY      FAMILY HISTORY: Family History  Problem Relation Age of Onset   Breast cancer Mother    Pancreatic cancer Mother    Aortic aneurysm Father     SOCIAL HISTORY: Social History   Socioeconomic History   Marital status: Married    Spouse name: Not on file   Number of children: 2   Years of education: 12   Highest education level: High school graduate  Occupational History   Occupation: Retired  Tobacco Use   Smoking status: Never   Smokeless tobacco: Never  Vaping Use   Vaping status: Never Used  Substance and Sexual Activity   Alcohol  use: No    Comment: no use since 2008   Drug use: No   Sexual activity: Yes  Other Topics Concern   Not on file  Social History Narrative   Lives with wife.   Right-handed.      1/2 caffeine coffee in the morning    Social Drivers of Health   Financial Resource Strain: High Risk (07/01/2020)   Received from [facility]   Overall Financial Resource Strain (CARDIA)    Difficulty of Paying Living Expenses: Very hard  Food Insecurity: No Food Insecurity (11/08/2023)   Hunger Vital Sign    Worried About Running Out of Food in the Last Year: Never true    Ran Out of Food in the Last Year: Never true  Transportation Needs: No Transportation Needs (11/08/2023)   PRAPARE - Administrator, Civil Service (Medical): No  Lack of Transportation (Non-Medical): No  Physical Activity: Sufficiently Active (07/14/2017)   Received from [facility]   Exercise Vital Sign    Days of Exercise per Week: 3 days    Minutes of Exercise per Session: 70 min  Stress: Not on file  Social Connections: Moderately Integrated (11/08/2023)   Social Connection and Isolation Panel    Frequency of Communication with Friends and Family: Never    Frequency of Social Gatherings with Friends and Family: More than three times a week    Attends Religious Services: More than 4 times per year     Active Member of [employer] or Organizations: No    Attends Banker Meetings: Never    Marital Status: Married  Catering Manager Violence: Not on file     PHYSICAL EXAM  GENERAL EXAM/CONSTITUTIONAL: Vitals:  Vitals:   11/17/23 1537  BP: 132/80  Pulse: 65  SpO2: 95%  Weight: (!) 330 lb (149.7 kg)  Height: 6' 1 (1.854 m)     Body mass index is 43.54 kg/m. Wt Readings from Last 3 Encounters:  11/17/23 (!) 330 lb (149.7 kg)  11/07/23 (!) 340 lb 6.2 oz (154.4 kg)  04/27/23 (!) 333 lb (151 kg)   Patient is in no distress; well developed, nourished and groomed; neck is supple, sitting in his electronic wheelchair    MUSCULOSKELETAL: Gait, strength, tone, movements noted in Neurologic exam below  NEUROLOGIC: MENTAL STATUS:  awake, alert, oriented to person, place normal attention and concentration language fluent, comprehension intact, naming intact fund of knowledge appropriate 2nd, 3rd, 4th, 6th - visual fields full to confrontation, extraocular muscles intact, no nystagmus 5th - facial sensation symmetric 7th - facial strength symmetric 8th - hearing intact 9th - palate elevates symmetrically, uvula midline 12th - tongue protrusion midline Using switch able able to move all 4 extremities antigravity.  Right upper extremity is limited by pain   DIAGNOSTIC DATA (LABS, IMAGING, TESTING) - I reviewed patient records, labs, notes, testing and imaging myself where available.  Lab Results  Component Value Date   WBC 10.3 11/08/2023   HGB 13.6 11/08/2023   HCT 42.5 11/08/2023   MCV 93.2 11/08/2023   PLT 185 11/08/2023      Component Value Date/Time   NA 137 11/08/2023 0448   NA 143 02/26/2021 1521   K 4.2 11/08/2023 0448   CL 107 11/08/2023 0448   CO2 22 11/08/2023 0448   GLUCOSE 118 (H) 11/08/2023 0448   BUN 11 11/08/2023 0448   BUN 13 02/26/2021 1521   CREATININE 0.96 11/08/2023 0448   CALCIUM  8.5 (L) 11/08/2023 0448   PROT 6.2 (L) 11/08/2023 0448    PROT 6.4 03/04/2022 1205   ALBUMIN  2.9 (L) 11/08/2023 0448   ALBUMIN  3.9 03/04/2022 1205   AST 20 11/08/2023 0448   ALT 23 11/08/2023 0448   ALKPHOS 96 11/08/2023 0448   BILITOT 0.6 11/08/2023 0448   BILITOT 0.3 03/04/2022 1205   GFRNONAA >60 11/08/2023 0448   GFRAA 70 (L) 04/04/2011 2128   No results found for: CHOL, HDL, LDLCALC, LDLDIRECT, TRIG Lab Results  Component Value Date   HGBA1C 6.0 (H) 07/07/2020   Lab Results  Component Value Date   VITAMINB12 556 08/14/2020   Lab Results  Component Value Date   TSH 4.643 (H) 08/19/2022    MRI Brain 08/14/2020 No evidence of residual or recurrent meningioma tumor. Vertex craniectomy and cranioplasty for resection of meningioma. Segmental resection of the superior sagittal sinus. No  residual or recurrent tumor seen in the region.  CT Venogram 04/28/2020 1. 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 A small portion of the mass does extend to the right of the falx and overlies the medial aspect of the posterior right frontal lobe. Unchanged mass effect upon the left frontal lobe with mild-to-moderate underlying vasogenic edema. Notably, there is mass effect upon the left motor cortex. 2. The parafalcine meningioma fills and expands portions of the superior sagittal sinus. However, anterior and posterior to this, the superior sagittal sinus appears patent. No evidence of intracranial venous thrombosis elsewhere. 3. Subtle remodeling of the inner table of the parietal calvarium adjacent to the mass. 4. Chronic cortically-based foci of encephalomalacia within the anteroinferior left frontal lobe and anterior left temporal lobe, likely posttraumatic in etiology   Routine EEG 08/13/2020 This study is suggestive of cortical dysfunction arising from left frontocentral region suggestive of underlying structural abnormality.  There is also mild diffuse encephalopathy, nonspecific etiology. No seizures or definite epileptiform discharges were seen throughout the recording.   I personally reviewed brain Images and previous EEG reports.   ASSESSMENT AND PLAN  73 y.o. year old male with past medical history of parafalcine meningioma s/p resection, seizure disorder, essential tremor, hypertension, diabetes who is presenting for following.  Unfortunately he had a breakthrough seizure on October 20 and during the workup was found to have 3 new meningiomas.  He did follow-up with neurosurgery and is scheduled for resection on November 20.  In terms of the seizures, he was loaded on Vimpat and started on Vimpat 50 mg twice daily, he reports compliance with the medication and no side effect.  Plan will be to continue patient on Vimpat and I will see him after his surgery.  They understand to contact me for any breakthrough seizure or any other questions.   1. Seizures (HCC)   2. Recurrent meningioma of the brain (HCC)   3. S/P resection of meningioma   4. Essential tremor      Patient Instructions  Continue with Vimpat 50 mg twice daily, refill given Continue with propranolol  20 mg twice daily for the tremors Continue with your other medications Continue to follow-up with [facility] Follow-up as scheduled in May   Per [place]  DMV statutes, patients with seizures are not allowed to drive until they have been seizure-free for six months.  Other recommendations include using caution when using heavy equipment or power tools. Avoid working on ladders or at heights. Take showers instead of baths.  Do not swim alone.  Ensure the water  temperature is not too high on the home water  heater. Do not go swimming alone. Do not lock yourself in a room alone (i.e. bathroom). When caring for infants or small children, sit down when holding, feeding, or changing them to minimize risk of injury to the child in the event you  have a seizure. Maintain good sleep hygiene. Avoid alcohol .  Also recommend adequate sleep, hydration, good diet and minimize stress.   During the Seizure  - First, ensure adequate ventilation and place patients on the floor on their left side  Loosen clothing around the neck and ensure the airway is patent. If the patient is clenching the teeth, do not force the mouth open with any object as this can cause severe damage - Remove all items from the surrounding that can be hazardous. The patient may be oblivious  to what's happening and may not even know what he or she is doing. If the patient is confused and wandering, either gently guide him/her away and block access to outside areas - Reassure the individual and be comforting - Call 911. In most cases, the seizure ends before EMS arrives. However, there are cases when seizures may last over 3 to 5 minutes. Or the individual may have developed breathing difficulties or severe injuries. If a pregnant patient or a person with diabetes develops a seizure, it is prudent to call an ambulance. - Finally, if the patient does not regain full consciousness, then call EMS. Most patients will remain confused for about 45 to 90 minutes after a seizure, so you must use judgment in calling for help. - Avoid restraints but make sure the patient is in a bed with padded side rails - Place the individual in a lateral position with the neck slightly flexed; this will help the saliva drain from the mouth and prevent the tongue from falling backward - Remove all nearby furniture and other hazards from the area - Provide verbal assurance as the individual is regaining consciousness - Provide the patient with privacy if possible - Call for help and start treatment as ordered by the caregiver   After the Seizure (Postictal Stage)  After a seizure, most patients experience confusion, fatigue, muscle pain and/or a headache. Thus, one should permit the individual to  sleep. For the next few days, reassurance is essential. Being calm and helping reorient the person is also of importance.  Most seizures are painless and end spontaneously. Seizures are not harmful to others but can lead to complications such as stress on the lungs, brain and the heart. Individuals with prior lung problems may develop labored breathing and respiratory distress.     No orders of the defined types were placed in this encounter.    Meds ordered this encounter  Medications   lacosamide (VIMPAT) 50 MG TABS tablet    Sig: Take 1 tablet (50 mg total) by mouth 2 (two) times daily.    Dispense:  180 tablet    Refill:  3     Return in about 7 months (around 06/05/2024).  Pastor Falling, MD 11/17/2023, 4:44 PM  [facility] [address] [place], [place] [address] [phone]

## 2023-11-17 NOTE — Patient Instructions (Addendum)
 Continue with Vimpat 50 mg twice daily, refill given Continue with propranolol  20 mg twice daily for the tremors Continue with your other medications Continue to follow-up with [facility] Follow-up as scheduled in May

## 2023-11-19 HISTORY — PX: TUMOR REMOVAL: SHX12

## 2023-11-28 ENCOUNTER — Telehealth (HOSPITAL_BASED_OUTPATIENT_CLINIC_OR_DEPARTMENT_OTHER): Payer: Self-pay | Admitting: *Deleted

## 2023-11-28 NOTE — Telephone Encounter (Signed)
   Name: Anthony Downs  DOB: 1950-03-22  MRN: [id number]  Primary Cardiologist: Alvan Carrier, MD   Preoperative team, please contact this patient and set up a phone call appointment for further preoperative risk assessment. Please obtain consent and complete medication review. Thank you for your help.  I confirm that guidance regarding antiplatelet and oral anticoagulation therapy has been completed and, if necessary, noted below.\\  Per office protocol, he may hold aspirin  for 7 days prior to procedure and should resume as soon as hemodynamically stable postoperatively.   I also confirmed the patient resides in the state of [place] . As per [facility] Medical Board telemedicine laws, the patient must reside in the state in which the provider is licensed.    Barnie Hila, NP 11/28/2023, 11:47 AM [facility]

## 2023-11-28 NOTE — Telephone Encounter (Signed)
   Pre-operative Risk Assessment    Patient Name: Anthony Downs  DOB: 03-09-1950 MRN: [id number]   Date of last office visit: 04/27/2023 Date of next office visit: None   Request for Surgical Clearance    Procedure:  Left frontal carniotomy for resection of hemangioma   Date of Surgery:  Clearance 12/08/23                                 Surgeon:  Dr. Elspeth Ahle  Surgeon's Group or Practice Name:  [facility] Phone number:  [phone] Fax number:  [phone]   Type of Clearance Requested:   - Medical  - Pharmacy:  Hold Aspirin  7 days prior   Type of Anesthesia:  General    Additional requests/questions:    Signed, Edsel Grayce Sanders   11/28/2023, 9:44 AM

## 2023-11-29 ENCOUNTER — Telehealth: Payer: Self-pay

## 2023-11-29 NOTE — Telephone Encounter (Signed)
 S/W pt and scheduled TELE preop appt 12/01/23. Med Rec and Consent done   Informed pt that per Barnie Hila, NP: last dose of aspirin  should be tomorrow 11/30/23.  Will update the surgeons office.

## 2023-11-29 NOTE — Telephone Encounter (Signed)
 Med Rec and Consent done.    Patient Consent for Virtual Visit        Anthony Downs has provided verbal consent on 11/29/2023 for a virtual visit (video or telephone).   CONSENT FOR VIRTUAL VISIT FOR:  Anthony Downs  By participating in this virtual visit I agree to the following:  I hereby voluntarily request, consent and authorize [facility] and its employed or contracted physicians, physician assistants, nurse practitioners or other licensed health care professionals (the Practitioner), to provide me with telemedicine health care services (the "Services) as deemed necessary by the treating Practitioner. I acknowledge and consent to receive the Services by the Practitioner via telemedicine. I understand that the telemedicine visit will involve communicating with the Practitioner through live audiovisual communication technology and the disclosure of certain medical information by electronic transmission. I acknowledge that I have been given the opportunity to request an in-person assessment or other available alternative prior to the telemedicine visit and am voluntarily participating in the telemedicine visit.  I understand that I have the right to withhold or withdraw my consent to the use of telemedicine in the course of my care at any time, without affecting my right to future care or treatment, and that the Practitioner or I may terminate the telemedicine visit at any time. I understand that I have the right to inspect all information obtained and/or recorded in the course of the telemedicine visit and may receive copies of available information for a reasonable fee.  I understand that some of the potential risks of receiving the Services via telemedicine include:  Delay or interruption in medical evaluation due to technological equipment failure or disruption; Information transmitted may not be sufficient (e.g. poor resolution of images) to allow for appropriate  medical decision making by the Practitioner; and/or  In rare instances, security protocols could fail, causing a breach of personal health information.  Furthermore, I acknowledge that it is my responsibility to provide information about my medical history, conditions and care that is complete and accurate to the best of my ability. I acknowledge that Practitioner's advice, recommendations, and/or decision may be based on factors not within their control, such as incomplete or inaccurate data provided by me or distortions of diagnostic images or specimens that may result from electronic transmissions. I understand that the practice of medicine is not an exact science and that Practitioner makes no warranties or guarantees regarding treatment outcomes. I acknowledge that a copy of this consent can be made available to me via my patient portal [facility] MyChart), or I can request a printed copy by calling the office of [facility].    I understand that my insurance will be billed for this visit.   I have read or had this consent read to me. I understand the contents of this consent, which adequately explains the benefits and risks of the Services being provided via telemedicine.  I have been provided ample opportunity to ask questions regarding this consent and the Services and have had my questions answered to my satisfaction. I give my informed consent for the services to be provided through the use of telemedicine in my medical care

## 2023-11-29 NOTE — Telephone Encounter (Deleted)
 Pt has tele preop appt 12/01/23.

## 2023-11-30 NOTE — Progress Notes (Signed)
 Virtual Visit via Telephone Note   Because of Anthony Downs co-morbid illnesses, he is at least at moderate risk for complications without adequate follow up.  This format is felt to be most appropriate for this patient at this time.  Due to technical limitations with video connection web designer), today's appointment will be conducted as an audio only telehealth visit, and Anthony Downs verbally agreed to proceed in this manner.   All issues noted in this document were discussed and addressed.  No physical exam could be performed with this format.  Evaluation Performed:  Preoperative cardiovascular risk assessment _____________   Date:  11/30/2023   Patient ID:  Anthony Downs, DOB Sep 19, 1950, MRN [id number] Patient Location:  Home Provider location:   Office  Primary Care Provider:  Bertell Satterfield, MD Primary Cardiologist:  Anthony Carrier, MD  Chief Complaint / Patient Profile   73 y.o. y/o male with a h/o essential hypertension, DVT, sleep apnea, hypokalemia, hyperlipidemia who is pending Left frontal carniotomy for resection of hemangioma  and presents today for telephonic preoperative cardiovascular risk assessment.  History of Present Illness    Anthony Downs is a 73 y.o. male who presents via audio/video conferencing for a telehealth visit today.  Pt was last seen in cardiology clinic on 04/27/2023 by Dr. Carrier Anthony.  At that time Anthony Downs was doing well .  The patient is now pending procedure as outlined above. Since his last visit, he continues to be stable from a cardiac standpoint.  Today he denies chest pain, shortness of breath, lower extremity edema, fatigue, palpitations, melena, hematuria, hemoptysis, diaphoresis, weakness, presyncope, syncope, orthopnea, and PND.   Past Medical History    Past Medical History:  Diagnosis Date   Acid reflux    Anxiety    Arthritis    Asthma    as a child   Basal cell carcinoma 01/26/2021    RIGHT POST SURICULAR INFERIOR   Basal cell carcinoma 01/26/2021   RIGHT POST AURICULAR SUPERIOR   Benign brain tumor (HCC)    Chest pain    a. 09/2019 MV: No ischemia.   Complication of anesthesia    slow to wake up and pain medicines make him sick   GERD (gastroesophageal reflux disease)    History of DVT (deep vein thrombosis)    History of echocardiogram    a. 09/2019 Echo: EF 60-65%, nl RV fxn; b. 09/2022 Echo: EF of 60-65% without regional wall motion abnormalities, normal RV size/function, and borderline dilatation of the aortic root at 39 mm.   Hypercholesteremia    Hypertension    PONV (postoperative nausea and vomiting)    PVC's (premature ventricular contractions)    a. 08/2022 Zio: predominantly sinus rhythm with an average heart rate of 71 (50-150), 5 brief SVT runs (longest 10.2 seconds with max rate of 150).  3.2% PVC burden was also noted.  No triggered events.   Skin cancer    skin cancer - basal cell on head, squamous behind ear   Sleep apnea    no Cpap   Subdural hematoma, post-traumatic (HCC) 2008   fell off truck hit head on concrete   Past Surgical History:  Procedure Laterality Date   APPLICATION OF CRANIAL NAVIGATION Left 04/29/2020   Procedure: APPLICATION OF CRANIAL NAVIGATION;  Surgeon: Anthony Debby LABOR, MD;  Location: [facility] OR;  Service: Neurosurgery;  Laterality: Left;   arthroscopic knee     COLONOSCOPY Downs/A 11/29/2018   Procedure: COLONOSCOPY;  Surgeon:  Anthony Claudis PENNER, MD;  Location: [facility] END[facility] SUITE;  Service: Endoscopy;  Laterality: Downs/A;  730-rescheduled 9/2 same time per Ann   CRANIOTOMY Left 04/29/2020   Procedure: LEFT CRANIECTOMY WITH TUMOR EXCISION;  Surgeon: Anthony Debby LABOR, MD;  Location: [facility] OR;  Service: Neurosurgery;  Laterality: Left;   KNEE SURGERY Right    NASAL SEPTOPLASTY W/ TURBINOPLASTY Bilateral 03/19/2020   Procedure: NASAL SEPTOPLASTY WITH BILATERAL  TURBINATE REDUCTION;  Surgeon: Anthony Clunes, MD;  Location: [facility] OR;  Service: ENT;   Laterality: Bilateral;   VASECTOMY      Allergies  Allergies  Allergen Reactions   Sinemet  [Carbidopa -Levodopa ] Other (See Comments)    sedation   Ativan  [Lorazepam ] Other (See Comments)    Worsening confusion   Haldol  [Haloperidol ] Other (See Comments)    Worsening confusion    Home Medications    Prior to Admission medications   Medication Sig Start Date End Date Taking? Authorizing Provider  albuterol  (VENTOLIN  HFA) 108 (90 Base) MCG/ACT inhaler Inhale 2 puffs into the lungs every 6 (six) hours as needed for wheezing or shortness of breath. 11/12/21   [provider]  aspirin  EC 81 MG tablet Take 81 mg by mouth at bedtime. Swallow whole.    [provider]  atorvastatin  (LIPITOR) 10 MG tablet Take 1 tablet (10 mg total) by mouth at bedtime. 08/15/23   Anthony Dorn FALCON, MD  b complex vitamins capsule Take 1 capsule by mouth daily.    [provider]  Cholecalciferol (VITAMIN D3 MAXIMUM STRENGTH) 125 MCG (5000 UT) capsule Take 5,000 Units by mouth daily. Patient not taking: Reported on 11/29/2023    [provider]  cyanocobalamin  (VITAMIN B12) 1000 MCG tablet Take 1,000 mcg by mouth daily.    [provider]  empagliflozin  (JARDIANCE ) 25 MG TABS tablet Take 1 tablet (25 mg total) by mouth daily. 02/22/23     finasteride  (PROSCAR ) 5 MG tablet Take 1 tablet (5 mg total) by mouth daily. 04/19/23   Anthony Cough, MD  furosemide  (LASIX ) 20 MG tablet Take 1 tablet (20 mg total) by mouth daily. 11/08/23   Anthony Lenis, MD  glimepiride  (AMARYL ) 4 MG tablet Take 2 tablets (8 mg total) by mouth daily. 11/08/23   Anthony Lenis, MD  glucose blood test strip Use to test blood sugar daily. 02/02/23     lacosamide (VIMPAT) 50 MG TABS tablet Take 1 tablet (50 mg total) by mouth 2 (two) times daily. 11/17/23   Anthony Lek, MD  magnesium  oxide (MAG-OX) 400 (240 Mg) MG tablet Take 1 tablet (400 mg total) by mouth daily. Patient taking differently:  Take 400 mg by mouth at bedtime. 09/14/22   Anthony Downs  pantoprazole  (PROTONIX ) 40 MG tablet Take 1 tablet (40 mg total) by mouth daily. 06/17/20   Love, Anthony RAMAN, Downs  potassium chloride  SA (KLOR-CON  M) 20 MEQ tablet Take 1 tablet (20 mEq total) by mouth daily. 11/08/23   Anthony Lenis, MD  propranolol  (INDERAL ) 20 MG tablet Take 1 tablet (20 mg total) by mouth 2 (two) times daily. 11/08/23   Anthony Lenis, MD  Propylene Glycol-Glycerin (SOOTHE OP) Apply 1 drop to eye daily as needed (dry eyes).    [provider]  tamsulosin  (FLOMAX ) 0.4 MG CAPS capsule Take 1 capsule (0.4 mg total) by mouth in the morning and at bedtime. 06/14/23   Anthony Cough, MD  VITAMIN D PO Take 1 tablet by mouth daily in the afternoon.    [provider]  Vitamin D, Ergocalciferol, (DRISDOL) 1.25 MG (50000 UNIT) CAPS capsule Take 50,000 Units by mouth once a week. 08/05/22   [provider]  zinc  gluconate 50 MG tablet Take 50 mg by mouth daily. Patient not taking: Reported on 11/29/2023    [provider]    Physical Exam    Vital Signs:  Anthony Downs does not have vital signs available for review today.  Given telephonic nature of communication, physical exam is limited. AAOx3. NAD. Normal affect.  Speech and respirations are unlabored.  Accessory Clinical Findings    None  Assessment & Plan    1.  Preoperative Cardiovascular Risk Assessment:  Left frontal carniotomy for resection of hemangioma    Date of Surgery:  Clearance 12/08/23                                  Surgeon:  Dr. Elspeth Ahle  Surgeon's Group or Practice Name:  [facility] Phone number:  [phone] Fax number:  [phone]      Primary Cardiologist: Anthony Carrier, MD  Chart reviewed as part of pre-operative protocol coverage. Given past medical history and time since last visit, based on ACC/AHA guidelines, Anthony Downs would be at acceptable risk  for the planned procedure without further cardiovascular testing.   His RCRI is very low risk, 0.4% risk of major cardiac event.  He is able to complete greater than 2.96 METS of physical activity.  Patient was advised that if he develops new symptoms prior to surgery to contact our office to arrange a follow-up appointment.  He verbalized understanding.  Per office protocol, he may hold aspirin  for 7 days prior to procedure and should resume as soon as hemodynamically stable postoperatively.   I will route this recommendation to the requesting party via Epic fax function and remove from pre-op pool.       Time:   Today, I have spent 9 minutes with the patient with telehealth technology discussing medical history, symptoms, and management plan. I spent 10 minutes reviewing patient's past cardiac history and cardiac medications.     Josefa CHRISTELLA Beauvais, NP  11/30/2023, 9:14 AM

## 2023-12-01 ENCOUNTER — Ambulatory Visit: Attending: Internal Medicine

## 2023-12-01 DIAGNOSIS — Z01810 Encounter for preprocedural cardiovascular examination: Secondary | ICD-10-CM

## 2023-12-05 ENCOUNTER — Other Ambulatory Visit (HOSPITAL_BASED_OUTPATIENT_CLINIC_OR_DEPARTMENT_OTHER): Payer: Self-pay

## 2023-12-19 ENCOUNTER — Other Ambulatory Visit (HOSPITAL_BASED_OUTPATIENT_CLINIC_OR_DEPARTMENT_OTHER): Payer: Self-pay

## 2023-12-26 ENCOUNTER — Encounter: Payer: Self-pay | Admitting: Neurology

## 2023-12-26 NOTE — Telephone Encounter (Signed)
 Yes, please add him for tomorrow, I have 12 patients. Thanks

## 2023-12-27 ENCOUNTER — Ambulatory Visit: Admitting: Neurology

## 2023-12-27 ENCOUNTER — Encounter: Payer: Self-pay | Admitting: Neurology

## 2023-12-27 VITALS — BP 131/83 | HR 73

## 2023-12-27 DIAGNOSIS — R569 Unspecified convulsions: Secondary | ICD-10-CM

## 2023-12-27 DIAGNOSIS — G250 Essential tremor: Secondary | ICD-10-CM

## 2023-12-27 DIAGNOSIS — D320 Benign neoplasm of cerebral meninges: Secondary | ICD-10-CM

## 2023-12-27 DIAGNOSIS — Z9889 Other specified postprocedural states: Secondary | ICD-10-CM

## 2023-12-27 MED ORDER — BRIVARACETAM 100 MG PO TABS: 100.0000 mg | ORAL_TABLET | Freq: Two times a day (BID) | ORAL | 5 refills | Status: DC

## 2023-12-27 NOTE — Progress Notes (Signed)
 [facility]  PATIENT: Anthony Downs DOB: 03-09-1950  REFERRING CLINICIAN: Bertell Satterfield, MD HISTORY FROM: Spouse Anthony Downs  REASON FOR VISIT: Seizure s/p meningioma resection    HISTORICAL  CHIEF COMPLAINT:  Chief Complaint  Patient presents with   Follow-up    Pt in room 13. Wife in room. Here for post seizure f/u.    INTERVAL HISTORY 12/27/2023 Patient presents for follow-up, he is accompanied by wife.  Last visit was on October 30, at that time he was scheduled to have meningioma surgery on November 20 years.  He did have the surgery, and was told that his meningioma was between grade 1 and grade 2 and the recommendation was 3 to 5 sessions of radiation.  Following surgery, he did have a generalized tonic-clonic seizure.  His Vimpat  was increased and patient was put on EEG while on  EEG he was noted to have additional seizures, total of 6 or 7.  At that time he was loaded with Keppra  and started on Keppra  1500 mg twice daily. Vimpat  reduced back to 50 mg twice daily.  Wife tells me while on Keppra , he became very irritable, shouting, and she is concerned about his attitude while on Keppra .  He has not had any additional seizures.     INTERVAL HISTORY 11/17/2023 Anthony Downs presents today for follow-up, he is accompanied by wife.  Last visit was in February, at that time he was doing well, no seizures while being off Depakote  but unfortunately on October 20 he did have a breakthrough seizure at home witnessed by wife.  He did hurt his right hand during the seizure.  He was taken to the hospital and during the workup was found to have 3 new meningiomas.  He did follow-up with [facility] and is scheduled for meningioma resection on November 20. Prior to the seizure he was doing well, was able to walk up to 180 feet but since recent seizure and right hand injury his mobility is limited.  While in the hospital, he was loaded on Vimpat  and currently on Vimpat  50 mg  twice daily, report compliance and denies any side effect from the medication.   INTERVAL HISTORY 03/09/2023:  Patient was called today, wife at his side.  Last visit was a year ago.  Since then, he has been doing well, denies any seizure or seizure like activity.  He discontinued Depakote  about 2 months ago, has not had any seizure.  Still having tremors on propranolol  20 mg daily.  Still doing the physical therapy, reports some improvement in his transfer.  No other complaints, no concerns at the moment.   INTERVAL HISTORY 03/04/2022:  Patient presents today for follow-up, last visit was a year ago.  Since then he has not had any seizure or seizure-like activity.  He remains on the Depakote  500 mg daily.  Reports tremor are better with propranolol  20 mg daily.  He is still undergoing physical therapy.  Wife reports improvement in this strength.   INTERVAL HISTORY 02/26/2021:  Patient presents for follow-up with wife Anthony Downs.  At last visit in October he did not have any seizures and plan was to decrease the Seroquel  because he was getting still sleepy during the daytime.  Since then no additional seizures, wife discontinued Seroquel , discontinued the Atarax  and Ritalin , no additional sundowning effects.  Right now he is only taking 1 trazodone  at nighttime.  He is more alert more interactive and is setting to restart physical therapy soon, he is making  progress with his RLE.  No seizures since last visit.  Currently he does not have any complaints.     INTERVAL HISTORY 11/06/2020 Mr. Anthony Downs presents today for follow-up, he presents today with his sister as his wife Anthony Downs has a doctor's appointment.  At last visit on August 15 plan was to discontinue the Keppra  and switch him to Depakote .  Per wife he was very somnolent initially, his Depakote  was checked and was 106 at that time advised her to decrease Depakote  to 500 mg twice daily.  He was also started on Seroquel  for delirium and titrated to 50 mg  nightly.  Per wife who contacted me via MyChart, she expressed that Anthony Downs is still somnolent during the daytime, sometimes has difficulty performing physical therapy and she is worried that he will discharged from physical therapy.  He has not had any seizures since last visit.  His sleep is better but still required the Seroquel  at night.   HISTORY OF PRESENT ILLNESS:  This is a 73 year old male with past medical history of essential hypertension, essential tremor, meningioma status post removal in April 2022 who is presenting with new onset seizure. In brief, wife reports that patient was normal prior to his meningioma resection, no memory deficits, no behavioral problems and no seizure.  He did have minimal right upper extremity and right lower extremity weakness as he was holding his right arm in close to his body and also dragging his right foot. Because of this, his PMD ordered a MRI brain which showed a meningioma left greater than right with surrounding vasogenic edema in the left posterior frontal lobe which was resected in April 2022.  Per chart review his neurosurgery note reports postop MRI showed a gross total resection except for some possible residual tumor in the patent portion of the sinus.  The tumor was very difficult to dissected free and appeared to be invasive.  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  His aphasia improved, strength slowly improved.  He was seen by PT/OT\SLP who recommended rehab placement.  He was in rehab for a total of Meningioma removed in April, rehab for 51 days then home. Wife reported since leaving rehab he is having delirium, sundowning syndrome.  He is on Seroquel  50 mg nightly and trazodone  100 mg nightly but he is not sleeping during the night. In the contrary, he sleeps during the day, per wife it is very difficult to keep him up during the day.   Wife  reports on July 26 she heard the bridge the bed shake, when she went to check on him his whole body was shaking, head turned to the right, eyes rolled back.  She reported episode lasted more than 2 minutes because she was still shaking while she was on the phone with EMS.  After the event, per wife he look like he was passed out.  He was [facility] taken to [facility] but later transferred to [facility] where he had a EEG that showed left greater than right slowing but no seizures.  He was also found to have a UTI.  He was started on levetiracetam  500 mg twice daily and since leaving the hospital wife denies any additional seizure or seizure-like event.  Since the seizure was reported memory is worse, with less on the right side is worse, and he is more irritable.  Sleep is worse as is not sleeping during  the night.   Handedness: Right handed    Seizure Type: Generalized convulsion   Current frequency: only one   Any injuries from seizures: None  Seizure risk factors: Meningioma s/p resection   Previous ASMs: Levetiracetam , valproic acid   Currenty ASMs: Lacosamide  50 mg twice daily and Levetiracetam  1500 mg BID  ASMs side effects: Memory, sleep, irritability with Levetiracetam , Sleep apnea   Brain Images 7/28:  No evidence of residual or recurrent meningioma tumor. Vertex craniectomy and cranioplasty for resection of meningioma. Segmental resection of the superior sagittal sinus. No residual or recurrent tumor seen in the region.  Previous EEGs: 7/27: This study is suggestive of cortical dysfunction arising from left frontocentral region suggestive of underlying structural abnormality. There is also mild diffuse encephalopathy, nonspecific etiology. No seizures or definite epileptiform discharges were seen throughout the recording.   OTHER MEDICAL CONDITIONS: HTN, Essential tremors   REVIEW OF SYSTEMS: Unable to fully obtain ROS due to patient mental status   ALLERGIES: Allergies   Allergen Reactions   Sinemet  [Carbidopa -Levodopa ] Other (See Comments)    sedation   Ativan  [Lorazepam ] Other (See Comments)    Worsening confusion   Haldol  [Haloperidol ] Other (See Comments)    Worsening confusion    HOME MEDICATIONS: Outpatient Medications Prior to Visit  Medication Sig Dispense Refill   albuterol  (VENTOLIN  HFA) 108 (90 Base) MCG/ACT inhaler Inhale 2 puffs into the lungs every 6 (six) hours as needed for wheezing or shortness of breath.     aspirin  EC 81 MG tablet Take 81 mg by mouth at bedtime. Swallow whole.     atorvastatin  (LIPITOR) 10 MG tablet Take 1 tablet (10 mg total) by mouth at bedtime. 90 tablet 3   b complex vitamins capsule Take 1 capsule by mouth daily.     Cholecalciferol (VITAMIN D3 MAXIMUM STRENGTH) 125 MCG (5000 UT) capsule Take 5,000 Units by mouth daily.     empagliflozin  (JARDIANCE ) 25 MG TABS tablet Take 1 tablet (25 mg total) by mouth daily. 30 tablet 11   finasteride  (PROSCAR ) 5 MG tablet Take 1 tablet (5 mg total) by mouth daily. 90 tablet 3   furosemide  (LASIX ) 20 MG tablet Take 1 tablet (20 mg total) by mouth daily.     glimepiride  (AMARYL ) 4 MG tablet Take 2 tablets (8 mg total) by mouth daily.     glucose blood test strip Use to test blood sugar daily. 100 each 3   lacosamide  (VIMPAT ) 50 MG TABS tablet Take 1 tablet (50 mg total) by mouth 2 (two) times daily. 180 tablet 3   magnesium  oxide (MAG-OX) 400 (240 Mg) MG tablet Take 1 tablet (400 mg total) by mouth daily. (Patient taking differently: Take 400 mg by mouth at bedtime.) 90 tablet 3   pantoprazole  (PROTONIX ) 40 MG tablet Take 1 tablet (40 mg total) by mouth daily. 30 tablet 0   potassium chloride  SA (KLOR-CON  M) 20 MEQ tablet Take 1 tablet (20 mEq total) by mouth daily.     propranolol  (INDERAL ) 20 MG tablet Take 1 tablet (20 mg total) by mouth 2 (two) times daily.     Propylene Glycol-Glycerin (SOOTHE OP) Apply 1 drop to eye daily as needed (dry eyes).     tamsulosin  (FLOMAX ) 0.4  MG CAPS capsule Take 1 capsule (0.4 mg total) by mouth in the morning and at bedtime. 180 capsule 3   VITAMIN D PO Take 1 tablet by mouth daily in the afternoon.     zinc  gluconate 50 MG tablet Take  50 mg by mouth daily.     levETIRAcetam  (KEPPRA ) 500 MG tablet 2 tabs  (1000)  q am and 3 tabs  (1500) q hs     cyanocobalamin  (VITAMIN B12) 1000 MCG tablet Take 1,000 mcg by mouth daily. (Patient not taking: Reported on 12/27/2023)     Vitamin D, Ergocalciferol, (DRISDOL) 1.25 MG (50000 UNIT) CAPS capsule Take 50,000 Units by mouth once a week. (Patient not taking: Reported on 12/27/2023)     No facility-administered medications prior to visit.    PAST MEDICAL HISTORY: Past Medical History:  Diagnosis Date   Acid reflux    Anxiety    Arthritis    Asthma    as a child   Basal cell carcinoma 01/26/2021   RIGHT POST SURICULAR INFERIOR   Basal cell carcinoma 01/26/2021   RIGHT POST AURICULAR SUPERIOR   Benign brain tumor (HCC)    Chest pain    a. 09/2019 MV: No ischemia.   Complication of anesthesia    slow to wake up and pain medicines make him sick   GERD (gastroesophageal reflux disease)    History of DVT (deep vein thrombosis)    History of echocardiogram    a. 09/2019 Echo: EF 60-65%, nl RV fxn; b. 09/2022 Echo: EF of 60-65% without regional wall motion abnormalities, normal RV size/function, and borderline dilatation of the aortic root at 39 mm.   Hypercholesteremia    Hypertension    PONV (postoperative nausea and vomiting)    PVC's (premature ventricular contractions)    a. 08/2022 [facility]io: predominantly sinus rhythm with an average heart rate of 71 (50-150), 5 brief SVT runs (longest 10.2 seconds with max rate of 150).  3.2% PVC burden was also noted.  No triggered events.   Skin cancer    skin cancer - basal cell on head, squamous behind ear   Sleep apnea    no Cpap   Subdural hematoma, post-traumatic (HCC) 2008   fell off truck hit head on concrete    PAST SURGICAL HISTORY: Past  Surgical History:  Procedure Laterality Date   APPLICATION OF CRANIAL NAVIGATION Left 04/29/2020   Procedure: APPLICATION OF CRANIAL NAVIGATION;  Surgeon: Cheryle Debby LABOR, MD;  Location: [facility] OR;  Service: Neurosurgery;  Laterality: Left;   arthroscopic knee     COLONOSCOPY N/A 11/29/2018   Procedure: COLONOSCOPY;  Surgeon: Golda Claudis PENNER, MD;  Location: [facility] END[facility] SUITE;  Service: Endoscopy;  Laterality: N/A;  730-rescheduled 9/2 same time per Ann   CRANIOTOMY Left 04/29/2020   Procedure: LEFT CRANIECTOMY WITH TUMOR EXCISION;  Surgeon: Cheryle Debby LABOR, MD;  Location: [facility] OR;  Service: Neurosurgery;  Laterality: Left;   KNEE SURGERY Right    NASAL SEPTOPLASTY W/ TURBINOPLASTY Bilateral 03/19/2020   Procedure: NASAL SEPTOPLASTY WITH BILATERAL  TURBINATE REDUCTION;  Surgeon: Karis Clunes, MD;  Location: [facility] OR;  Service: ENT;  Laterality: Bilateral;   TUMOR REMOVAL  11/2023   removal 3 tumors   VASECTOMY      FAMILY HISTORY: Family History  Problem Relation Age of Onset   Breast cancer Mother    Pancreatic cancer Mother    Aortic aneurysm Father     SOCIAL HISTORY: Social History   Socioeconomic History   Marital status: Married    Spouse name: Not on file   Number of children: 2   Years of education: 12   Highest education level: High school graduate  Occupational History   Occupation: Retired  Tobacco Use   Smoking status:  Never   Smokeless tobacco: Never  Vaping Use   Vaping status: Never Used  Substance and Sexual Activity   Alcohol  use: No    Comment: no use since 2008   Drug use: No   Sexual activity: Yes  Other Topics Concern   Not on file  Social History Narrative   Lives with wife.   Right-handed.      1/2 caffeine coffee in the morning    Social Drivers of Health   Financial Resource Strain: Low Risk  (12/26/2023)   Received from [facility]   Overall Financial Resource Strain (CARDIA)    Difficulty of Paying Living Expenses: Not hard  at all  Food Insecurity: No Food Insecurity (12/26/2023)   Received from [facility]   Hunger Vital Sign    Within the past 12 months, you worried that your food would run out before you got the money to buy more.: Never true    Within the past 12 months, the food you bought just didn't last and you didn't have money to get more.: Never true  Transportation Needs: No Transportation Needs (12/26/2023)   Received from [facility] - Transportation    In the past 12 months, has lack of transportation kept you from medical appointments or from getting medications?: No    Lack of Transportation (Non-Medical): No  Physical Activity: Sufficiently Active (07/14/2017)   Received from [facility]   Exercise Vital Sign    Days of Exercise per Week: 3 days    Minutes of Exercise per Session: 70 min  Stress: Not on file  Social Connections: Moderately Integrated (11/08/2023)   Social Connection and Isolation Panel    Frequency of Communication with Friends and Family: Never    Frequency of Social Gatherings with Friends and Family: More than three times a week    Attends Religious Services: More than 4 times per year    Active Member of [employer] or Organizations: No    Attends Banker Meetings: Never    Marital Status: Married  Catering Manager Violence: Not on file    PHYSICAL EXAM  GENERAL EXAM/CONSTITUTIONAL: Vitals:  Vitals:   12/27/23 1431  BP: 131/83  Pulse: 73     There is no height or weight on file to calculate BMI. Wt Readings from Last 3 Encounters:  11/17/23 (!) 330 lb (149.7 kg)  11/07/23 (!) 340 lb 6.2 oz (154.4 kg)  04/27/23 (!) 333 lb (151 kg)   Patient is in no distress; well developed, nourished and groomed; neck is supple, sitting in his electronic wheelchair    MUSCULOSKELETAL: Gait, strength, tone, movements noted in Neurologic exam below  NEUROLOGIC: MENTAL STATUS:  awake, alert, oriented to person,  place normal attention and concentration language fluent, comprehension intact, naming intact fund of knowledge appropriate 2nd, 3rd, 4th, 6th - visual fields full to confrontation, extraocular muscles intact, no nystagmus 5th - facial sensation symmetric 7th - facial strength symmetric 8th - hearing intact 9th - palate elevates symmetrically, uvula midline 12th - tongue protrusion midline Using switch able able to move all 4 extremities antigravity.  Right upper extremity is limited by pain   DIAGNOSTIC DATA (LABS, IMAGING, TESTING) - I reviewed patient records, labs, notes, testing and imaging myself where available.  Lab Results  Component Value Date   WBC 10.3 11/08/2023   HGB 13.6 11/08/2023   HCT 42.5 11/08/2023   MCV 93.2 11/08/2023  PLT 185 11/08/2023      Component Value Date/Time   NA 137 11/08/2023 0448   NA 143 02/26/2021 1521   K 4.2 11/08/2023 0448   CL 107 11/08/2023 0448   CO2 22 11/08/2023 0448   GLUCOSE 118 (H) 11/08/2023 0448   BUN 11 11/08/2023 0448   BUN 13 02/26/2021 1521   CREATININE 0.96 11/08/2023 0448   CALCIUM  8.5 (L) 11/08/2023 0448   PROT 6.2 (L) 11/08/2023 0448   PROT 6.4 03/04/2022 1205   ALBUMIN  2.9 (L) 11/08/2023 0448   ALBUMIN  3.9 03/04/2022 1205   AST 20 11/08/2023 0448   ALT 23 11/08/2023 0448   ALKPHOS 96 11/08/2023 0448   BILITOT 0.6 11/08/2023 0448   BILITOT 0.3 03/04/2022 1205   GFRNONAA >60 11/08/2023 0448   GFRAA 70 (L) 04/04/2011 2128   No results found for: CHOL, HDL, LDLCALC, LDLDIRECT, TRIG Lab Results  Component Value Date   HGBA1C 6.0 (H) 07/07/2020   Lab Results  Component Value Date   VITAMINB12 556 08/14/2020   Lab Results  Component Value Date   TSH 4.643 (H) 08/19/2022    MRI Brain 08/14/2020 No evidence of residual or recurrent meningioma tumor. Vertex craniectomy and cranioplasty for resection of meningioma. Segmental resection of the superior sagittal sinus. No residual or recurrent tumor  seen in the region.  CT Venogram 04/28/2020 1. 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 A small portion of the mass does extend to the right of the falx and overlies the medial aspect of the posterior right frontal lobe. Unchanged mass effect upon the left frontal lobe with mild-to-moderate underlying vasogenic edema. Notably, there is mass effect upon the left motor cortex. 2. The parafalcine meningioma fills and expands portions of the superior sagittal sinus. However, anterior and posterior to this, the superior sagittal sinus appears patent. No evidence of intracranial venous thrombosis elsewhere. 3. Subtle remodeling of the inner table of the parietal calvarium adjacent to the mass. 4. Chronic cortically-based foci of encephalomalacia within the anteroinferior left frontal lobe and anterior left temporal lobe, likely posttraumatic in etiology   Routine EEG 08/13/2020 This study is suggestive of cortical dysfunction arising from left frontocentral region suggestive of underlying structural abnormality. There is also mild diffuse encephalopathy, nonspecific etiology. No seizures or definite epileptiform discharges were seen throughout the recording.   I personally reviewed brain Images and previous EEG reports.   ASSESSMENT AND PLAN  73 y.o. year old male with past medical history of parafalcine meningioma s/p resection, seizure disorder, essential tremor, hypertension, diabetes who is presenting for follow up.  Unfortunately he had a breakthrough seizure on October 20 and during the workup was found to have 3 new meningiomas.  He underwent resection of the new meningiomas on November 20, but unfortunately did have breakthrough seizures.  He was loaded on Keppra  and currently on Keppra  1000 mg in the morning and 1500 mg in the evening.  There is increased irritability.  Plan will be to  switch Keppra  to Briviact , while continuing him on Vimpat  50 mg twice daily.  If he continues to do well, at next visit we will consider switching Vimpat  to topiramate or zonisamide due to essential tremors.  I will see him in 2 to 3 months for follow-up or sooner if worse.  They understand to contact me if he does have a breakthrough seizure or any other questions.  1. Seizures (HCC)   2. Recurrent meningioma of the brain (HCC)   3. S/P resection of meningioma   4. Essential tremor       Patient Instructions  Continue with Vimpat  50 mg twice daily Will switch levetiracetam  to brivaracetam  100 mg twice daily At next visit if no seizures, we will consider switching lacosamide  to topiramate or zonisamide due to essential tremor.   I will see him in 6 months for follow-up or sooner if worse.   Per [place] statutes, patients with seizures are not allowed to drive until they have been seizure-free for six months.  Other recommendations include using caution when using heavy equipment or power tools. Avoid working on ladders or at heights. Take showers instead of baths.  Do not swim alone.  Ensure the water  temperature is not too high on the home water  heater. Do not go swimming alone. Do not lock yourself in a room alone (i.e. bathroom). When caring for infants or small children, sit down when holding, feeding, or changing them to minimize risk of injury to the child in the event you have a seizure. Maintain good sleep hygiene. Avoid alcohol .  Also recommend adequate sleep, hydration, good diet and minimize stress.   During the Seizure  - First, ensure adequate ventilation and place patients on the floor on their left side  Loosen clothing around the neck and ensure the airway is patent. If the patient is clenching the teeth, do not force the mouth open with any object as this can cause severe damage - Remove all items from the surrounding that can be hazardous. The patient may be  oblivious to what's happening and may not even know what he or she is doing. If the patient is confused and wandering, either gently guide him/her away and block access to outside areas - Reassure the individual and be comforting - Call 911. In most cases, the seizure ends before EMS arrives. However, there are cases when seizures may last over 3 to 5 minutes. Or the individual may have developed breathing difficulties or severe injuries. If a pregnant patient or a person with diabetes develops a seizure, it is prudent to call an ambulance. - Finally, if the patient does not regain full consciousness, then call EMS. Most patients will remain confused for about 45 to 90 minutes after a seizure, so you must use judgment in calling for help. - Avoid restraints but make sure the patient is in a bed with padded side rails - Place the individual in a lateral position with the neck slightly flexed; this will help the saliva drain from the mouth and prevent the tongue from falling backward - Remove all nearby furniture and other hazards from the area - Provide verbal assurance as the individual is regaining consciousness - Provide the patient with privacy if possible - Call for help and start treatment as ordered by the caregiver   After the Seizure (Postictal Stage)  After a seizure, most patients experience confusion, fatigue, muscle pain and/or a headache. Thus, one should permit the individual to sleep. For the next few days, reassurance is essential. Being calm and helping reorient the person is also of importance.  Most seizures are painless and end spontaneously. Seizures are not harmful to others but can lead to complications such as stress on the lungs, brain and the heart. Individuals with prior lung problems may develop labored breathing and respiratory distress.     No orders of the defined types were  placed in this encounter.    Meds ordered this encounter  Medications   brivaracetam   (BRIVIACT ) 100 MG TABS tablet    Sig: Take 1 tablet (100 mg total) by mouth 2 (two) times daily.    Dispense:  60 tablet    Refill:  5     Return in about 6 months (around 06/26/2024).  Pastor Falling, MD 12/27/2023, 9:54 PM  [facility] [address] [place], [place] [address] [phone]

## 2023-12-27 NOTE — Patient Instructions (Signed)
 Continue with Vimpat  50 mg twice daily Will switch levetiracetam  to brivaracetam  100 mg twice daily At next visit if no seizures, we will consider switching lacosamide  to topiramate or zonisamide due to essential tremor.   I will see him in 6 months for follow-up or sooner if worse.

## 2023-12-28 ENCOUNTER — Encounter: Admitting: Physical Medicine & Rehabilitation

## 2023-12-28 ENCOUNTER — Telehealth: Payer: Self-pay | Admitting: *Deleted

## 2023-12-28 MED ORDER — BRIVARACETAM 100 MG PO TABS: 100.0000 mg | ORAL_TABLET | Freq: Two times a day (BID) | ORAL | 0 refills | Status: DC

## 2023-12-28 NOTE — Telephone Encounter (Signed)
 Thank you :)

## 2023-12-28 NOTE — Telephone Encounter (Signed)
 Anthony Downs  Male, 73 y.o., 12-05-1950  [phone]  MRN: [id number]   Pt sister in law here to pick up meds for pt that was here yesterday. he was supposed to get some medication to hold over until filled. I don't see a note so brivaracetam  100 mg twice daily   [facility] is out and pt needs to start today so Dr. Gregg advised he would give some samples.   I gave #28 tablet (2 boxes of #14) to sister in law, Anthony Downs.  (Along with info about drug, savings program, PAP) if needed.   LOT [id number]  exp 12-20-2026, [facility] 49525-229-85

## 2023-12-29 ENCOUNTER — Telehealth: Payer: Self-pay | Admitting: *Deleted

## 2023-12-29 NOTE — Telephone Encounter (Signed)
 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 He is having a lot of issues at HS. She is requesting some medication to help manage and would like to speak with Dr Babs.

## 2023-12-30 ENCOUNTER — Telehealth: Payer: Self-pay | Admitting: Physical Medicine & Rehabilitation

## 2023-12-30 ENCOUNTER — Encounter: Payer: Self-pay | Admitting: Physical Medicine & Rehabilitation

## 2023-12-30 MED ORDER — RISPERIDONE 0.5 MG PO TABS: 0.5000 mg | ORAL_TABLET | Freq: Every day | ORAL | 3 refills | Status: DC

## 2023-12-30 NOTE — Telephone Encounter (Signed)
 Pt, wife called and requested med for her husband to sleep. She said he has been unable to sleep for some time now.  She states she need some help

## 2023-12-30 NOTE — Telephone Encounter (Signed)
 I spoke to Anthony Downs and we will try 0.5 risperdal at bedtime, a half tablet initially, then increase to a full tablet if the 0.25mg  is ineffective. He may have a second dose prior to midnight if needed.

## 2024-01-02 ENCOUNTER — Encounter: Payer: Self-pay | Admitting: Physical Medicine & Rehabilitation

## 2024-01-03 ENCOUNTER — Telehealth: Payer: Self-pay | Admitting: *Deleted

## 2024-01-03 ENCOUNTER — Encounter: Payer: Self-pay | Admitting: Neurology

## 2024-01-03 NOTE — Telephone Encounter (Signed)
 Mrs Cancro called and really needs to speak with Dr Babs. She really needs help with Charlie.

## 2024-01-03 NOTE — Telephone Encounter (Signed)
 Mrs Fancher called and she really needs some help with Charlie. She reques ts Dr Babs please call her

## 2024-01-04 ENCOUNTER — Ambulatory Visit: Admitting: Physician Assistant

## 2024-01-04 ENCOUNTER — Encounter: Payer: Self-pay | Admitting: Physician Assistant

## 2024-01-04 VITALS — BP 139/87 | HR 80 | Temp 97.2°F | Wt 317.0 lb

## 2024-01-04 DIAGNOSIS — I10 Essential (primary) hypertension: Secondary | ICD-10-CM

## 2024-01-04 DIAGNOSIS — D320 Benign neoplasm of cerebral meninges: Secondary | ICD-10-CM | POA: Diagnosis not present

## 2024-01-04 DIAGNOSIS — Z7984 Long term (current) use of oral hypoglycemic drugs: Secondary | ICD-10-CM

## 2024-01-04 DIAGNOSIS — Z6841 Body Mass Index (BMI) 40.0 and over, adult: Secondary | ICD-10-CM | POA: Diagnosis not present

## 2024-01-04 DIAGNOSIS — Z7689 Persons encountering health services in other specified circumstances: Secondary | ICD-10-CM

## 2024-01-04 DIAGNOSIS — E1165 Type 2 diabetes mellitus with hyperglycemia: Secondary | ICD-10-CM

## 2024-01-04 DIAGNOSIS — Z0000 Encounter for general adult medical examination without abnormal findings: Secondary | ICD-10-CM | POA: Insufficient documentation

## 2024-01-04 MED ORDER — PANTOPRAZOLE SODIUM 40 MG PO TBEC: 40.0000 mg | DELAYED_RELEASE_TABLET | Freq: Every day | ORAL | 1 refills | Status: AC

## 2024-01-04 NOTE — Assessment & Plan Note (Addendum)
 Continue current management.  Currently controlled with glimepiride  and Jardiance .  Patient is on aspirin  and statin. Lipid panel- overdue, awaiting records from previous PCP.  A1c today.  Microalbumin ordered today. Urine cup given to wife as giving urine sample day would be difficult.  Routine diabetic retinopathy screening: overdue, recommended yearly eye exams.  Foot exam and monofilament test done.
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
## 2024-01-04 NOTE — Assessment & Plan Note (Signed)
 139/87 Controlled. Continue current medications. No change in management. Discussed DASH diet and dietary sodium restrictions.

## 2024-01-04 NOTE — Assessment & Plan Note (Signed)
 Colonoscopy due but deferred due to health and mobility issues. - Will reassess need for colonoscopy in the future.

## 2024-01-04 NOTE — Assessment & Plan Note (Signed)
 Follows with neurosurgery at [facility]. Post-surgical care for meningioma with reported tumor progression. Radiation therapy planned. Noted post-operative delirium and behavioral changes, possibly medication-related. Follow up scheduled for early next month. - Continue with planned follow up and radiation therapy starting in January. - Monitor for post-operative delirium and behavioral changes. - Warning signs and ER precautions discussed.

## 2024-01-05 LAB — MICROALBUMIN / CREATININE URINE RATIO
Creatinine, Urine: 79.8 mg/dL
Microalb/Creat Ratio: <4 mg/g{creat} (ref 0–29)
Microalbumin, Urine: <3 ug/mL

## 2024-01-05 LAB — HEMOGLOBIN A1C
Est. average glucose Bld gHb Est-mCnc: 126 mg/dL
Hgb A1c MFr Bld: 6 % — ABNORMAL HIGH (ref 4.8–5.6)

## 2024-01-06 ENCOUNTER — Encounter: Payer: Self-pay | Admitting: Physical Medicine & Rehabilitation

## 2024-01-09 ENCOUNTER — Ambulatory Visit: Payer: Self-pay

## 2024-01-09 ENCOUNTER — Encounter: Admitting: Nurse Practitioner

## 2024-01-09 NOTE — Telephone Encounter (Signed)
 FYI Only or Action Required?: FYI only for provider: appointment scheduled on 01/09/24.  Patient was last seen in primary care on 01/04/2024 by Grooms, [place], [place].  Called Nurse Triage reporting Insomnia.  Symptoms began a week ago.  Interventions attempted: Nothing.  Symptoms are: gradually worsening.  Triage Disposition: See PCP When Office is Open (Within 3 Days)  Patient/caregiver understands and will follow disposition?: Yes  Reason for Disposition  MODERATE - SEVERE insomnia (e.g., awake most of night; lack of sleep interferes with ability to function during the day; interferes with work or school)  Answer Assessment - Civil Engineer, Contracting spoke with pt's wife [name]. [name] states pt had brain surgery nov 20th - has sundowners syndrome, headache, antsy, and is waking every 1-2 hours and is lashing out during the day. Wife unsure of what she should do. Writer recommended an appt and pt has been scheduled for a virtual appt.   Prescribed Risperidone    1. DESCRIPTION: Tell me about your sleeping problem. (e.g., waking frequently during night, sleeping during day and awake at night, trouble falling asleep) How bad is it?      Waking every 1-2 hours 2. ONSET: How long have you been having trouble sleeping? (e.g., days, weeks, months; longstanding sleep problems)     Over a week 3. DAYTIME SLEEP PATTERN: How much time do you spend sleeping or napping during the day?     N/a 4. STRESSORS: Is there anything that is making you feel stressed? Is there something that worries you?     N/a 5. PAIN: Do you have any pain that is keeping you awake? (e.g., back pain, joint pain) If Yes, ask How bad is the pain? (e.g., scale 0-10; mild, moderate, severe).     Wife unsure 6. CAFFEINE: Do you drink caffeinated beverages? If Yes, ask How much each day? (e.g., coffee, tea, colas)     N/a 7. ALCOHOL  USE OR SUBSTANCE USE: Do you drink alcohol  or use any  substances?     N/a 8. MEDICINE CHANGE: Has there been any recent change in medicines? (e.g., new medicine started, stopped, or dose changed).      Pt prescribed risperidone   9. TREATMENT: What have you done so far to treat this sleep problem? (e.g., prescription or OTC sleep medicines, herbal or dietary supplements, cannabis, relaxation strategies)     N/a 10. OTHER SYMPTOMS: Do you have any other symptoms?  (e.g., difficulty breathing)       denies  Protocols used: Insomnia-A-AH  Copied from CRM #[id number]. Topic: Clinical - Red Word Triage >> Jan 09, 2024 11:37 AM [place] MATSU wrote: Red Word that prompted transfer to Nurse Triage: patient is restless and irritable and lashing out and is not sleeping. Sundowners syndrome; patients wife not sure what to do.

## 2024-01-09 NOTE — Progress Notes (Signed)
" ° °  Subjective:    Patient ID: Anthony Downs, male    DOB: [birth year], 73 y.o.   MRN: [id number]  HPI    Review of Systems     Objective:   Physical Exam        Assessment & Plan:    "

## 2024-01-10 ENCOUNTER — Ambulatory Visit: Payer: Self-pay | Admitting: Physician Assistant

## 2024-01-11 ENCOUNTER — Encounter: Payer: Self-pay | Admitting: Physician Assistant

## 2024-01-11 ENCOUNTER — Ambulatory Visit: Admitting: Physician Assistant

## 2024-01-11 VITALS — BP 130/88 | HR 77 | Temp 97.7°F | Ht 73.0 in | Wt 310.0 lb

## 2024-01-11 DIAGNOSIS — R451 Restlessness and agitation: Secondary | ICD-10-CM

## 2024-01-11 MED ORDER — QUETIAPINE FUMARATE 100 MG PO TABS: 100.0000 mg | ORAL_TABLET | Freq: Every day | ORAL | 1 refills | Status: DC

## 2024-01-11 NOTE — Assessment & Plan Note (Signed)
 Persistent sleep disturbance with agitation. Risperdal  ineffective. Seroquel  considered due to different drug class and potential benefits. - Discontinued Risperdal . - Initiated Seroquel  at bedtime. - Follow up with myself Doctor Arlana to assess effectiveness of Seroquel .

## 2024-01-11 NOTE — Progress Notes (Signed)
 "  Acute Office Visit  Subjective:     Patient ID: Anthony Downs, male    DOB: August 21, 1950, 73 y.o.   MRN: [id number]   Discussed the use of AI scribe software for clinical note transcription with the patient, who gave verbal consent to proceed.  History of Present Illness Anthony Downs is a 73 year old male who presents with ongoing sleep disturbances and irritability. His wife acts as historian.   For the past three weeks he has slept only about three hours per night, with brief 15-20 minute periods of deep sleep followed by waking disoriented and speaking incoherently, which leads to frustration and anger when he is not understood. He is on Risperdal  without improvement in sleep.  He previously received Ativan , Haldol , and Trazodone  and his wife reports these did not provide significant relief.  Recently he has had increased irritability and anger, especially when he feels his caregiver is helping with tasks he believes he can do himself, such as holding his urinal or feeding himself. This has become more pronounced and is straining caregiving.  He has had poor appetite with recent weight loss, now 310 lb. His caregiver is giving protein shakes to support his nutrition.    Review of Systems  Constitutional:  Negative for activity change, appetite change, fatigue and fever.  Eyes:  Negative for visual disturbance.  Respiratory:  Negative for shortness of breath.   Cardiovascular:  Negative for chest pain.  Neurological:  Negative for light-headedness and headaches.  Psychiatric/Behavioral:  Positive for agitation and sleep disturbance. Negative for decreased concentration. The patient is not nervous/anxious.          Objective:     BP 130/88   Pulse 77   Temp 97.7 F (36.5 C)   Ht 6' 1 (1.854 m)   Wt (!) 310 lb (140.6 kg)   SpO2 96%   BMI 40.90 kg/m   Physical Exam Constitutional:      General: He is not in acute distress.    Appearance: Normal  appearance. He is obese. He is not ill-appearing.  HENT:     Head: Normocephalic and atraumatic.     Mouth/Throat:     Mouth: Mucous membranes are moist.     Pharynx: Oropharynx is clear.  Eyes:     Extraocular Movements: Extraocular movements intact.     Conjunctiva/sclera: Conjunctivae normal.  Cardiovascular:     Rate and Rhythm: Normal rate and regular rhythm.     Heart sounds: Normal heart sounds. No murmur heard. Pulmonary:     Breath sounds: Normal breath sounds. No wheezing or rales.  Skin:    General: Skin is warm and dry.  Neurological:     General: No focal deficit present.     Mental Status: He is alert and oriented to person, place, and time.  Psychiatric:        Mood and Affect: Mood normal.        Behavior: Behavior normal.     No results found for any visits on 01/11/24.      Assessment & Plan:  Restlessness and agitation Assessment & Plan: Persistent sleep disturbance with agitation. Risperdal  ineffective. Seroquel  considered due to different drug class and potential benefits. - Discontinued Risperdal . - Initiated Seroquel  at bedtime. - Follow up with myself Doctor Arlana to assess effectiveness of Seroquel .   Orders: -     QUEtiapine  Fumarate; Take 1 tablet (100 mg total) by mouth at bedtime.  Dispense: 30 tablet; Refill:  1    No follow-ups on file.  Kaytie Ratcliffe, PA-C  "

## 2024-01-13 ENCOUNTER — Telehealth: Payer: Self-pay

## 2024-01-13 NOTE — Telephone Encounter (Signed)
 Copied from CRM [id number]. Topic: Clinical - Prescription Issue >> Jan 13, 2024  8:07 AM Anairis L wrote: Reason for CRM: [name] patient wife is calling in to update provider, with medication change.The change did not change patient was up all night.

## 2024-01-16 ENCOUNTER — Other Ambulatory Visit: Payer: Self-pay | Admitting: Physician Assistant

## 2024-01-16 NOTE — Telephone Encounter (Signed)
 Patient's wife is calling to report that QUEtiapine  (SEROQUEL ) 100 MG tablet [[id number]] is not working at all. And makes the patient hyper. Also requesting medication for the patient's tremors. Wife has not given the patient anything since 01/12/24 but melatonin & tylenol  and and it not making the patient sleepy. Gave the patient the Seroquel  on 01/11/24 and stopped the medication. Patient is wanting a response today. Making wife and the patient upset. Requesting a call back.  [facility] [address], [place] - [id number] [place] [address] Phone: [phone]  Fax: [phone]

## 2024-01-16 NOTE — Telephone Encounter (Signed)
 Appointment scheduled with PCP

## 2024-01-16 NOTE — Telephone Encounter (Signed)
 Copied from CRM #[id number]. Topic: Clinical - Medication Refill >> Jan 16, 2024  8:17 AM Delon HERO wrote: Medication: Rx #: [id number]  empagliflozin  (JARDIANCE ) 25 MG TABS tablet [[id number]]    Has the patient contacted their pharmacy? Yes (Agent: If no, request that the patient contact the pharmacy for the refill. If patient does not wish to contact the pharmacy document the reason why and proceed with request.) (Agent: If yes, when and what did the pharmacy advise?)  This is the patient's preferred pharmacy:  [facility] [address], [place] - [address] [place] [address] Phone: [phone] Fax: [phone]    Is this the correct pharmacy for this prescription? Yes If no, delete pharmacy and type the correct one.   Has the prescription been filled recently? Yes  Is the patient out of the medication? Yes  Has the patient been seen for an appointment in the last year OR does the patient have an upcoming appointment? Yes  Can we respond through MyChart? Yes  Agent: Please be advised that Rx refills may take up to 3 business days. We ask that you follow-up with your pharmacy.

## 2024-01-17 ENCOUNTER — Telehealth: Payer: Self-pay

## 2024-01-17 MED ORDER — EMPAGLIFLOZIN 25 MG PO TABS: 25.0000 mg | ORAL_TABLET | Freq: Every day | ORAL | 11 refills | Status: AC

## 2024-01-17 NOTE — Telephone Encounter (Signed)
 Anthony Downs called about patient stating that he is having an adverse reaction to Seroquel . Still not able to sleep, tremors are worse.

## 2024-01-18 ENCOUNTER — Ambulatory Visit: Admitting: Physician Assistant

## 2024-01-18 ENCOUNTER — Encounter: Payer: Self-pay | Admitting: Physician Assistant

## 2024-01-18 VITALS — BP 125/92 | HR 77 | Temp 97.2°F | Ht 73.0 in | Wt 317.0 lb

## 2024-01-18 DIAGNOSIS — Z6841 Body Mass Index (BMI) 40.0 and over, adult: Secondary | ICD-10-CM

## 2024-01-18 DIAGNOSIS — R451 Restlessness and agitation: Secondary | ICD-10-CM

## 2024-01-18 MED ORDER — HYDROXYZINE PAMOATE 25 MG PO CAPS: ORAL_CAPSULE | ORAL | 0 refills | Status: DC

## 2024-01-18 NOTE — Assessment & Plan Note (Signed)
 Persistent sleep disturbance with agitation despite trials of multiple medications. Symptoms include poor sleep, anxiety, irritability, and increased tremors. Previous medications caused adverse effects. Differential includes medication side effects and underlying neurological issues. Considered hydroxyzine  as a potential treatment. Recommended neurology or neurosurgery consultation. - Trial hydroxyzine  at home for at least two nights to assess efficacy and side effects. - If hydroxyzine  is ineffective or causes adverse effects, consider trial of over-the-counter Benadryl. - Contact neurology or neurosurgery team for further evaluation and management.

## 2024-01-18 NOTE — Progress Notes (Signed)
" ° °  Acute Office Visit  Subjective:     Patient ID: Anthony Downs, male    DOB: 1950/01/28, 73 y.o.   MRN: [id number]   Discussed the use of AI scribe software for clinical note transcription with the patient, who gave verbal consent to proceed.  History of Present Illness Anthony Downs is a 73 year old male who presents with worsening agitation and insomnia following brain surgery. He is accompanied by his wife who acts as historian.   She reports progressive agitation and severe insomnia since brain surgery. He sleeps about an hour or less at night and is unable to get comfortable, with prominent nighttime worsening. He feels anxious and irritable, which further disrupts sleep.  Trials of Seroquel , trazodone , Ativan , Haldol , and risperidone  have worsened agitation or caused tremors and a sensation that his whole body is shaking, so he is not tolerating these. Melatonin has not improved sleep despite dose increases. Ibuprofen and Tylenol  have not helped relaxation and sometimes cause sweating.    ROS- per HPI     Objective:     BP (!) 125/92   Pulse 77   Temp (!) 97.2 F (36.2 C)   Ht 6' 1 (1.854 m)   Wt (!) 317 lb (143.8 kg)   SpO2 (!) 88%   BMI 41.82 kg/m   Physical Exam Constitutional:      General: He is not in acute distress.    Appearance: Normal appearance. He is obese. He is not ill-appearing.  HENT:     Head: Normocephalic and atraumatic.     Mouth/Throat:     Mouth: Mucous membranes are moist.     Pharynx: Oropharynx is clear.  Eyes:     Extraocular Movements: Extraocular movements intact.     Conjunctiva/sclera: Conjunctivae normal.  Cardiovascular:     Rate and Rhythm: Normal rate and regular rhythm.     Heart sounds: Normal heart sounds. No murmur heard. Pulmonary:     Breath sounds: Normal breath sounds. No wheezing or rales.  Skin:    General: Skin is warm and dry.  Neurological:     General: No focal deficit present.     Mental  Status: He is alert and oriented to person, place, and time.  Psychiatric:        Mood and Affect: Mood normal.        Behavior: Behavior normal.     No results found for any visits on 01/18/24.      Assessment & Plan:  Restlessness and agitation Assessment & Plan: Persistent sleep disturbance with agitation despite trials of multiple medications. Symptoms include poor sleep, anxiety, irritability, and increased tremors. Previous medications caused adverse effects. Differential includes medication side effects and underlying neurological issues. Considered hydroxyzine  as a potential treatment. Recommended neurology or neurosurgery consultation. - Trial hydroxyzine  at home for at least two nights to assess efficacy and side effects. - If hydroxyzine  is ineffective or causes adverse effects, consider trial of over-the-counter Benadryl. - Contact neurology or neurosurgery team for further evaluation and management.  Orders: -     hydrOXYzine  Pamoate; 1-2 tablets at bedtime as needed for sleep and anxiety.  Dispense: 45 capsule; Refill: 0   Return if symptoms worsen or fail to improve.  Yoav Okane, PA-C  "

## 2024-01-20 ENCOUNTER — Other Ambulatory Visit (HOSPITAL_COMMUNITY): Payer: Self-pay

## 2024-01-20 ENCOUNTER — Telehealth: Payer: Self-pay | Admitting: Pharmacy Technician

## 2024-01-20 NOTE — Telephone Encounter (Signed)
 Pharmacy Patient Advocate Encounter   Received notification from Onbase that prior authorization for hydrOXYzine  Pamoate 25MG  capsules  is required/requested.   Insurance verification completed.   The patient is insured through [employer].   Per test claim: The current 23 day co-pay is, $3.31.  No PA needed at this time. This test claim was processed through [facility]- copay amounts may vary at other pharmacies due to pharmacy/plan contracts, or as the patient moves through the different stages of their insurance plan.

## 2024-01-23 ENCOUNTER — Encounter: Payer: Self-pay | Admitting: Physical Medicine & Rehabilitation

## 2024-01-26 ENCOUNTER — Other Ambulatory Visit: Payer: Self-pay

## 2024-01-26 ENCOUNTER — Encounter: Payer: Self-pay | Admitting: Physician Assistant

## 2024-01-26 MED ORDER — POTASSIUM CHLORIDE CRYS ER 20 MEQ PO TBCR: 20.0000 meq | EXTENDED_RELEASE_TABLET | Freq: Every day | ORAL | 0 refills | Status: DC

## 2024-01-29 ENCOUNTER — Encounter: Payer: Self-pay | Admitting: Physical Medicine & Rehabilitation

## 2024-02-03 ENCOUNTER — Encounter: Payer: Self-pay | Admitting: Physician Assistant

## 2024-02-03 ENCOUNTER — Encounter: Payer: Self-pay | Admitting: Neurology

## 2024-02-06 ENCOUNTER — Other Ambulatory Visit: Payer: Self-pay | Admitting: Physician Assistant

## 2024-02-06 ENCOUNTER — Other Ambulatory Visit: Payer: Self-pay

## 2024-02-06 MED ORDER — BRIVARACETAM 100 MG PO TABS: 100.0000 mg | ORAL_TABLET | Freq: Two times a day (BID) | ORAL | Status: DC

## 2024-02-15 ENCOUNTER — Other Ambulatory Visit (HOSPITAL_BASED_OUTPATIENT_CLINIC_OR_DEPARTMENT_OTHER): Payer: Self-pay

## 2024-02-20 ENCOUNTER — Other Ambulatory Visit: Payer: Self-pay | Admitting: Neurology

## 2024-02-20 MED ORDER — TOPIRAMATE 100 MG PO TABS: 100.0000 mg | ORAL_TABLET | Freq: Two times a day (BID) | ORAL | 3 refills | Status: AC

## 2024-02-21 ENCOUNTER — Encounter: Payer: Self-pay | Admitting: Physician Assistant

## 2024-02-21 ENCOUNTER — Other Ambulatory Visit: Payer: Self-pay | Admitting: Physician Assistant

## 2024-02-21 ENCOUNTER — Other Ambulatory Visit: Payer: Self-pay | Admitting: Neurology

## 2024-02-21 MED ORDER — POTASSIUM CHLORIDE CRYS ER 20 MEQ PO TBCR: 20.0000 meq | EXTENDED_RELEASE_TABLET | Freq: Every day | ORAL | 0 refills | Status: AC

## 2024-02-22 ENCOUNTER — Encounter: Payer: Self-pay | Admitting: Physical Medicine & Rehabilitation

## 2024-02-22 ENCOUNTER — Encounter: Admitting: Physical Medicine & Rehabilitation

## 2024-02-22 VITALS — BP 123/81 | HR 72 | Ht 73.0 in

## 2024-02-22 DIAGNOSIS — R251 Tremor, unspecified: Secondary | ICD-10-CM

## 2024-02-22 DIAGNOSIS — G969 Disorder of central nervous system, unspecified: Secondary | ICD-10-CM | POA: Diagnosis not present

## 2024-02-22 MED ORDER — PROPRANOLOL HCL 20 MG PO TABS: 20.0000 mg | ORAL_TABLET | Freq: Three times a day (TID) | ORAL | Status: AC

## 2024-02-22 NOTE — Progress Notes (Signed)
 "  Subjective:    Patient ID: Anthony Downs, male    DOB: 1950/08/04, 74 y.o.   MRN: [id number]  HPI  Discussed the use of AI scribe software for clinical note transcription with the patient, who gave verbal consent to proceed.  History of Present Illness Anthony Downs is a 74 year old male with recurrent cerebral meningioma, post-craniotomy, seizure disorder, and incomplete tetraplegia who presents for follow-up of major functional setbacks after recent hospitalization and surgery.  Cerebral Neoplasm and Postoperative Course: - Rehospitalized in October 2025 following a seizure; MRI revealed three new dural-based lesions in the prior resection bed, consistent with recurrent meningioma - Underwent repeat left frontal vertex craniotomy with resection of recurrent meningiomas on December 10, 2023 - Postoperative course complicated by seizures and significant behavioral disturbances - Major functional setbacks noted since surgery  Seizure Disorder and Management: - Postoperative seizures required initiation of antiepileptic therapy - Current regimen includes Vimpat  50 mg twice daily and recently initiated Topamax  - Graviact discontinued due to cost; primidone  stopped - No seizures since starting Topamax  - Increased somnolence since Topamax  initiation - Propranolol  used, though not primarily for seizure management  Behavioral Disturbance and Cognitive Deficits: - Symptoms include agitation, restlessness, aggressive behavior, and jitteriness - Benadryl administered every six hours for behavioral management; dose reductions result in increased restlessness - Hydroxyzine  previously trialed but worsened jitteriness - Scheduled for phone appointment with behavioral clinic psychiatrist specializing in tumor patients next week - Disorientation upon waking from deep sleep - Cognitive deficits present  Mobility Impairment and Tremor: - Physical and occupational therapy previously  stopped due to caregiver strain and bereavement; intent to resume when circumstances allow - Tremors have calmed since restarting three unspecified medications and discontinuing primidone  - Family history of tremors  Neurogenic Bladder: - Bladder function improved after discontinuing Lasix  - Previously incontinent and required diapers, especially after returning home post-hospitalization - Currently able to hold urine, though continues to urinate frequently - Increased frequency may be related to sugar-free lemonade intake and prior diuretic use  Neurogenic Bowel: - Bowel function maintained with Miralax  and Senokot - Metamucil discontinued due to excessive effect - No current issues with constipation or bowel movements  Caregiver Support and Functional Status: - Caregiver provides assistance with medication administration and behavioral management - Nighttime assistance available as needed, though not currently required full-time - Recent bereavement has impacted caregiver capacity and therapy scheduling      Pain Inventory Average Pain 0 Pain Right Now 0 My pain is no pain  LOCATION OF PAIN  no pain  BOWEL Number of stools per week: 3-4 Oral laxative use Yes  Type of laxative Miralax , Doculax  Enema or suppository use Yes    BLADDER Normal and Pads I  Mobility use a wheelchair needs help with transfers Do you have any goals in this area?  yes  Function retired I need assistance with the following:  dressing, bathing, toileting, meal prep, household duties, and shopping Do you have any goals in this area?  yes  Neuro/Psych weakness numbness tremor trouble walking confusion anxiety  Prior Studies Any changes since last visit?  yes CT/MRI MRI at [facility] (in late 2025)   Physicians involved in your care Any changes since last visit?  yes Dr. Bluford at [facility] History  Problem Relation Age of Onset   Breast cancer Mother    Pancreatic cancer Mother     Aortic aneurysm Father    Social History  Socioeconomic History   Marital status: Married    Spouse name: Not on file   Number of children: 2   Years of education: 12   Highest education level: Associate degree: occupational, scientist, product/process development, or vocational program  Occupational History   Occupation: Retired  Tobacco Use   Smoking status: Never   Smokeless tobacco: Never  Vaping Use   Vaping status: Never Used  Substance and Sexual Activity   Alcohol  use: Not Currently    Comment: Social and very rairly   Drug use: Never   Sexual activity: Not Currently    Birth control/protection: None  Other Topics Concern   Not on file  Social History Narrative   Lives with wife.   Right-handed.      1/2 caffeine coffee in the morning    Social Drivers of Health   Tobacco Use: Low Risk  (01/27/2024)   Received from [facility]   Patient History    Smoking Tobacco Use: Never    Smokeless Tobacco Use: Never    Passive Exposure: Not on file  Financial Resource Strain: Low Risk  (01/26/2024)   Received from [facility]   Overall Financial Resource Strain (CARDIA)    Difficulty of Paying Living Expenses: Not hard at all  Food Insecurity: No Food Insecurity (01/26/2024)   Received from [facility]   Epic    Within the past 12 months, you worried that your food would run out before you got the money to buy more.: Never true    Within the past 12 months, the food you bought just didn't last and you didn't have money to get more.: Never true  Transportation Needs: No Transportation Needs (01/26/2024)   Received from [facility] - Transportation    In the past 12 months, has lack of transportation kept you from medical appointments or from getting medications?: No    Lack of Transportation (Non-Medical): No  Physical Activity: Inactive (01/03/2024)   Exercise Vital Sign    Days of Exercise per Week: 0 days     Minutes of Exercise per Session: Not on file  Stress: Stress Concern Present (01/03/2024)   [employer] - Occupational Stress Questionnaire    Feeling of Stress: Very much  Social Connections: Socially Integrated (01/03/2024)   Social Connection and Isolation Panel    Frequency of Communication with Friends and Family: Once a week    Frequency of Social Gatherings with Friends and Family: Twice a week    Attends Religious Services: 1 to 4 times per year    Active Member of [employer] or Organizations: Yes    Attends Banker Meetings: 1 to 4 times per year    Marital Status: Married  Depression (PHQ2-9): High Risk (01/18/2024)   Depression (PHQ2-9)    PHQ-2 Score: 23  Alcohol  Screen: Not on file  Housing: Low Risk  (01/26/2024)   Received from [facility]   Epic    In the last 12 months, was there a time when you were not able to pay the mortgage or rent on time?: No    In the past 12 months, how many times have you moved where you were living?: 0    At any time in the past 12 months, were you homeless or living in a shelter (including now)?: No  Utilities: Not At Risk (01/26/2024)   Received from [facility]  Epic    In the past 12 months has the electric, gas, oil, or water  company threatened to shut off services in your home?: No  Health Literacy: Adequate Health Literacy (12/14/2023)   Received from [facility]   [phone] Health Literacy    How often do you need to have someone help you when you read instructions, pamphlets, or other written material from your doctor or pharmacy?: Never   Past Surgical History:  Procedure Laterality Date   APPLICATION OF CRANIAL NAVIGATION Left 04/29/2020   Procedure: APPLICATION OF CRANIAL NAVIGATION;  Surgeon: Cheryle Debby LABOR, MD;  Location: [facility] OR;  Service: Neurosurgery;  Laterality: Left;   arthroscopic knee     BRAIN SURGERY     COLONOSCOPY N/A  11/29/2018   Procedure: COLONOSCOPY;  Surgeon: Golda Claudis PENNER, MD;  Location: [facility] END[facility] SUITE;  Service: Endoscopy;  Laterality: N/A;  730-rescheduled 9/2 same time per Ann   CRANIOTOMY Left 04/29/2020   Procedure: LEFT CRANIECTOMY WITH TUMOR EXCISION;  Surgeon: Cheryle Debby LABOR, MD;  Location: [facility] OR;  Service: Neurosurgery;  Laterality: Left;   KNEE SURGERY Right    NASAL SEPTOPLASTY W/ TURBINOPLASTY Bilateral 03/19/2020   Procedure: NASAL SEPTOPLASTY WITH BILATERAL  TURBINATE REDUCTION;  Surgeon: Karis Clunes, MD;  Location: [facility] OR;  Service: ENT;  Laterality: Bilateral;   TUMOR REMOVAL  11/2023   removal 3 tumors   VASECTOMY     Past Medical History:  Diagnosis Date   Acid reflux    Anxiety    Arthritis    Asthma    as a child   Basal cell carcinoma 01/26/2021   RIGHT POST SURICULAR INFERIOR   Basal cell carcinoma 01/26/2021   RIGHT POST AURICULAR SUPERIOR   Benign brain tumor (HCC)    Chest pain    a. 09/2019 MV: No ischemia.   Complication of anesthesia    slow to wake up and pain medicines make him sick   Diabetes mellitus without complication (HCC)    GERD (gastroesophageal reflux disease)    History of DVT (deep vein thrombosis)    History of echocardiogram    a. 09/2019 Echo: EF 60-65%, nl RV fxn; b. 09/2022 Echo: EF of 60-65% without regional wall motion abnormalities, normal RV size/function, and borderline dilatation of the aortic root at 39 mm.   Hypercholesteremia    Hypertension    Myocardial infarction (HCC)    PONV (postoperative nausea and vomiting)    PVC's (premature ventricular contractions)    a. 08/2022 Zio: predominantly sinus rhythm with an average heart rate of 71 (50-150), 5 brief SVT runs (longest 10.2 seconds with max rate of 150).  3.2% PVC burden was also noted.  No triggered events.   S/P nasal septoplasty 03/19/2020   Seizures (HCC)    Skin cancer    skin cancer - basal cell on head, squamous behind ear   Sleep apnea    no Cpap   Status post  craniectomy 08/12/2020   Subdural hematoma, post-traumatic (HCC) 2008   fell off truck hit head on concrete   There were no vitals taken for this visit.  Opioid Risk Score:   Fall Risk Score:  `1  Depression screen [facility] 2/9     01/18/2024   10:46 AM 01/11/2024    9:51 AM 01/04/2024    2:28 PM 08/31/2023   11:42 AM 04/20/2023   11:28 AM 11/17/2022    8:40 AM 04/21/2022    2:13 PM  Depression screen PHQ  2/9  Decreased Interest 3 3 1  0 0 0 0  Down, Depressed, Hopeless 3 3 2  0 0 0 0  PHQ - 2 Score 6 6 3  0 0 0 0  Altered sleeping 3 3 3       Tired, decreased energy 3 3 3       Change in appetite 2 1 0      Feeling bad or failure about yourself  1 1 1       Trouble concentrating 2 2 2       Moving slowly or fidgety/restless 3 3 2       Suicidal thoughts 3 0 0      PHQ-9 Score 23 19 14       Difficult doing work/chores Very difficult Very difficult Somewhat difficult        Review of Systems  Musculoskeletal:  Positive for gait problem.  Neurological:  Positive for tremors, weakness and numbness.  Psychiatric/Behavioral:  Positive for confusion.        Anxiety   All other systems reviewed and are negative.      Objective:   Physical Exam General: No acute distress HEENT: NCAT, EOMI, oral membranes moist Cards: reg rate  Chest: normal effort Abdomen: Soft, NT, ND Skin: dry, intact Extremities: tr to 1+ BlE edema Psych: pleasant and appropriate  Skin: intact Neuro:  alert but doesn't initiate much.  Upper extremity strength grossly  4 out of 5 on left, RUE 3/5 with tremor. Some limits in ROM. LLE 3- to 3+/5.with KE/KF, 3-/5  ADF/PF.  RLE 3/5 prox to distal with decreased LT on that side.    Reflexes are still 3+. 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 Right UE tight with flexion/rotation.          Assessment & Plan:  1. Incomplete tetraplegia with cognitive/linguistic deficits due to bilateral frontal grade 1 meningiomas status  post tumor resection on April 29, 2020 with recurrence and repeat crani/seizures.              -resume outpt therapy at [facility] soon when his wife is ready ~1 mo 2.  Left posterior tibial vein DVT:   off of  coumadin             -recent dopplers clear 3.  Sleep disorder/OSA             -not using  CPAP 4.  Neurogenic bowel:              -recent program has been effective             -  miralax  only once daily  -Continue senokot-s 2 tabs 2x daily             - suppository or dig stim on toilet if needed 5.  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 Swelling: he and his wife manage the best they can             -  lasix  stopped             -TEDS, elevation while in chair 9. Behavior--improved on benadryl --can continue for now  -buspar?  -following up with psych   20 minutes of face to face patient care time were spent during this visit. All questions were encouraged and answered. Follow up with me in 4 months       "

## 2024-02-22 NOTE — Patient Instructions (Signed)
" °  VISIT SUMMARY: During your visit today, we discussed the major functional setbacks you have experienced following your recent hospitalization and surgery for recurrent cerebral meningioma. We reviewed your current treatment plans for seizure disorder, behavioral disturbances, mobility impairment, tremor, neurogenic bladder, and neurogenic bowel. We also addressed the impact of caregiver support on your rehabilitation and functional status.  YOUR PLAN: -INCOMPLETE TETRAPLEGIA WITH COGNITIVE AND FUNCTIONAL DEFICITS: Incomplete tetraplegia means partial paralysis affecting all four limbs, often due to spinal cord or brain injury. Your condition is due to recurrent bilateral frontal meningiomas and post-craniotomy effects. We discussed referring you for physical and occupational therapy when circumstances allow, and recommended scheduling these therapies on separate days to optimize your participation and outcomes.  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 Your tremor, likely due to neurological disease and hereditary factors, has improved with the resumption of propranolol  and discontinuation of primidone . You should continue taking propranolol  three times daily for tremor management.  -SEIZURE DISORDER SECONDARY TO BRAIN TUMOR: A seizure disorder involves episodes of uncontrolled electrical activity in the brain, which can cause convulsions and other symptoms. Your seizures, which occurred after your craniotomy, are well-controlled with Vimpat  and Topamax . You should continue taking Vimpat  50 mg twice daily and Topamax  as prescribed. We noted increased sleepiness with Topamax .  -NEUROGENIC BOWEL: Neurogenic bowel is a condition where nerve damage affects the ability to control bowel movements. Your bowel function is well-managed with Miralax  and Senokot S. You should continue  taking Miralax  daily and Senokot S two tablets twice daily, adjusting the regimen as needed based on your response.  -URINARY INCONTINENCE SECONDARY TO NEUROGENIC BLADDER: Neurogenic bladder is a condition where nerve damage affects bladder control, leading to urinary incontinence. Your urinary continence has improved after stopping Lasix , and you no longer require diapers. You should continue taking Flomax  and have discontinued Lasix .  -BEHAVIORAL DISTURBANCE DUE TO BRAIN TUMOR: Behavioral disturbances can include agitation, restlessness, and aggressive behavior, often due to cognitive deficits and the effects of a brain tumor. You should continue using Benadryl as needed for agitation, as reported by your caregiver. Hydroxyzine  has been discontinued due to worsening symptoms. A phone appointment with a behavioral clinic psychiatrist is scheduled for further evaluation and management.  INSTRUCTIONS: Please follow up with the behavioral clinic psychiatrist as scheduled. Additionally, when circumstances allow, resume physical and occu pational therapy, scheduling them on separate days to optimize your participation and outcomes.    Contains text generated by [employer].   "

## 2024-02-23 ENCOUNTER — Other Ambulatory Visit (HOSPITAL_BASED_OUTPATIENT_CLINIC_OR_DEPARTMENT_OTHER): Payer: Self-pay

## 2024-02-23 ENCOUNTER — Other Ambulatory Visit (HOSPITAL_COMMUNITY): Payer: Self-pay

## 2024-02-23 ENCOUNTER — Other Ambulatory Visit: Payer: Self-pay

## 2024-03-22 ENCOUNTER — Ambulatory Visit

## 2024-06-05 ENCOUNTER — Ambulatory Visit: Admitting: Neurology

## 2024-06-20 ENCOUNTER — Encounter: Admitting: Physical Medicine & Rehabilitation

## 2024-07-04 ENCOUNTER — Ambulatory Visit: Admitting: Physician Assistant

## 2024-07-30 ENCOUNTER — Ambulatory Visit: Admitting: Neurology
